# Patient Record
Sex: Female | Born: 1985 | Race: White | Hispanic: No | Marital: Single | Smoking: Current every day smoker
Health system: Southern US, Community
[De-identification: ages and names within clinical notes are randomized; demographics above are authoritative.]

## PROBLEM LIST (undated history)

## (undated) DIAGNOSIS — F1910 Other psychoactive substance abuse, uncomplicated: Secondary | ICD-10-CM

## (undated) DIAGNOSIS — I10 Essential (primary) hypertension: Secondary | ICD-10-CM

## (undated) DIAGNOSIS — K219 Gastro-esophageal reflux disease without esophagitis: Secondary | ICD-10-CM

## (undated) DIAGNOSIS — E1110 Type 2 diabetes mellitus with ketoacidosis without coma: Secondary | ICD-10-CM

## (undated) DIAGNOSIS — I509 Heart failure, unspecified: Secondary | ICD-10-CM

## (undated) HISTORY — PX: TUBAL LIGATION: SHX77

## (undated) HISTORY — DX: Heart failure, unspecified: I50.9

## (undated) HISTORY — DX: Essential (primary) hypertension: I10

---
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
## 2003-05-12 ENCOUNTER — Encounter: Payer: Self-pay | Admitting: *Deleted

## 2003-05-12 ENCOUNTER — Emergency Department (HOSPITAL_COMMUNITY): Admission: EM | Admit: 2003-05-12 | Discharge: 2003-05-12 | Payer: Self-pay

## 2003-05-20 ENCOUNTER — Inpatient Hospital Stay (HOSPITAL_COMMUNITY): Admission: AD | Admit: 2003-05-20 | Discharge: 2003-05-20 | Payer: Self-pay | Admitting: *Deleted

## 2003-06-18 ENCOUNTER — Emergency Department (HOSPITAL_COMMUNITY): Admission: EM | Admit: 2003-06-18 | Discharge: 2003-06-18 | Payer: Self-pay | Admitting: Emergency Medicine

## 2003-07-22 ENCOUNTER — Emergency Department (HOSPITAL_COMMUNITY): Admission: EM | Admit: 2003-07-22 | Discharge: 2003-07-22 | Payer: Self-pay | Admitting: Emergency Medicine

## 2003-07-22 ENCOUNTER — Encounter: Payer: Self-pay | Admitting: Emergency Medicine

## 2006-10-16 ENCOUNTER — Emergency Department (HOSPITAL_COMMUNITY): Admission: EM | Admit: 2006-10-16 | Discharge: 2006-10-16 | Payer: Self-pay | Admitting: Emergency Medicine

## 2006-12-20 ENCOUNTER — Emergency Department (HOSPITAL_COMMUNITY): Admission: EM | Admit: 2006-12-20 | Discharge: 2006-12-21 | Payer: Self-pay | Admitting: Emergency Medicine

## 2007-05-02 ENCOUNTER — Emergency Department (HOSPITAL_COMMUNITY): Admission: EM | Admit: 2007-05-02 | Discharge: 2007-05-02 | Payer: Self-pay | Admitting: Emergency Medicine

## 2007-09-01 ENCOUNTER — Emergency Department (HOSPITAL_COMMUNITY): Admission: EM | Admit: 2007-09-01 | Discharge: 2007-09-01 | Payer: Self-pay | Admitting: Emergency Medicine

## 2007-11-22 ENCOUNTER — Emergency Department (HOSPITAL_COMMUNITY): Admission: EM | Admit: 2007-11-22 | Discharge: 2007-11-22 | Payer: Self-pay | Admitting: Family Medicine

## 2007-12-31 ENCOUNTER — Inpatient Hospital Stay (HOSPITAL_COMMUNITY): Admission: EM | Admit: 2007-12-31 | Discharge: 2008-01-03 | Payer: Self-pay | Admitting: Emergency Medicine

## 2007-12-31 ENCOUNTER — Encounter: Payer: Self-pay | Admitting: Obstetrics & Gynecology

## 2008-04-29 ENCOUNTER — Inpatient Hospital Stay (HOSPITAL_COMMUNITY): Admission: EM | Admit: 2008-04-29 | Discharge: 2008-05-01 | Payer: Self-pay | Admitting: Emergency Medicine

## 2008-05-09 ENCOUNTER — Ambulatory Visit: Payer: Self-pay | Admitting: Internal Medicine

## 2008-05-10 ENCOUNTER — Encounter (INDEPENDENT_AMBULATORY_CARE_PROVIDER_SITE_OTHER): Payer: Self-pay | Admitting: Internal Medicine

## 2008-05-10 LAB — CONVERTED CEMR LAB: Microalb, Ur: 1.84 mg/dL (ref 0.00–1.89)

## 2008-06-12 ENCOUNTER — Ambulatory Visit: Payer: Self-pay | Admitting: *Deleted

## 2008-08-05 ENCOUNTER — Emergency Department (HOSPITAL_COMMUNITY): Admission: EM | Admit: 2008-08-05 | Discharge: 2008-08-05 | Payer: Self-pay | Admitting: Emergency Medicine

## 2008-10-17 DIAGNOSIS — K219 Gastro-esophageal reflux disease without esophagitis: Secondary | ICD-10-CM

## 2008-10-17 HISTORY — DX: Gastro-esophageal reflux disease without esophagitis: K21.9

## 2009-01-18 ENCOUNTER — Emergency Department (HOSPITAL_COMMUNITY): Admission: EM | Admit: 2009-01-18 | Discharge: 2009-01-19 | Payer: Self-pay | Admitting: Emergency Medicine

## 2009-02-02 ENCOUNTER — Inpatient Hospital Stay (HOSPITAL_COMMUNITY): Admission: EM | Admit: 2009-02-02 | Discharge: 2009-02-05 | Payer: Self-pay | Admitting: Emergency Medicine

## 2009-04-29 ENCOUNTER — Emergency Department (HOSPITAL_COMMUNITY): Admission: EM | Admit: 2009-04-29 | Discharge: 2009-04-29 | Payer: Self-pay | Admitting: Emergency Medicine

## 2010-05-24 ENCOUNTER — Emergency Department (HOSPITAL_COMMUNITY)
Admission: EM | Admit: 2010-05-24 | Discharge: 2010-05-25 | Payer: Self-pay | Source: Home / Self Care | Admitting: Emergency Medicine

## 2010-10-10 ENCOUNTER — Emergency Department (HOSPITAL_COMMUNITY)
Admission: EM | Admit: 2010-10-10 | Discharge: 2010-10-10 | Payer: Self-pay | Source: Home / Self Care | Admitting: Emergency Medicine

## 2010-10-17 ENCOUNTER — Inpatient Hospital Stay (HOSPITAL_COMMUNITY)
Admission: EM | Admit: 2010-10-17 | Discharge: 2010-10-19 | Payer: PRIVATE HEALTH INSURANCE | Source: Home / Self Care | Attending: Internal Medicine | Admitting: Internal Medicine

## 2010-12-14 ENCOUNTER — Emergency Department (HOSPITAL_COMMUNITY): Payer: Self-pay

## 2010-12-14 ENCOUNTER — Inpatient Hospital Stay (HOSPITAL_COMMUNITY)
Admission: EM | Admit: 2010-12-14 | Discharge: 2010-12-15 | DRG: 638 | Discharge status: Against Medical Advice | Payer: Self-pay | Attending: Internal Medicine | Admitting: Internal Medicine

## 2010-12-14 DIAGNOSIS — Z880 Allergy status to penicillin: Secondary | ICD-10-CM

## 2010-12-14 DIAGNOSIS — E860 Dehydration: Secondary | ICD-10-CM | POA: Diagnosis present

## 2010-12-14 DIAGNOSIS — E872 Acidosis, unspecified: Secondary | ICD-10-CM | POA: Diagnosis present

## 2010-12-14 DIAGNOSIS — N179 Acute kidney failure, unspecified: Secondary | ICD-10-CM | POA: Diagnosis present

## 2010-12-14 DIAGNOSIS — F17200 Nicotine dependence, unspecified, uncomplicated: Secondary | ICD-10-CM | POA: Diagnosis present

## 2010-12-14 DIAGNOSIS — E1010 Type 1 diabetes mellitus with ketoacidosis without coma: Principal | ICD-10-CM | POA: Diagnosis present

## 2010-12-14 DIAGNOSIS — Z794 Long term (current) use of insulin: Secondary | ICD-10-CM

## 2010-12-14 LAB — BASIC METABOLIC PANEL
BUN: 19 mg/dL (ref 6–23)
BUN: 13 mg/dL (ref 6–23)
CO2: 10 mEq/L — ABNORMAL LOW (ref 19–32)
Calcium: 9.3 mg/dL (ref 8.4–10.5)
Calcium: 8.8 mg/dL (ref 8.4–10.5)
Chloride: 96 mEq/L (ref 96–112)
Creatinine, Ser: 1.64 mg/dL — ABNORMAL HIGH (ref 0.4–1.2)
Creatinine, Ser: 0.75 mg/dL (ref 0.4–1.2)
GFR calc Af Amer: 46 mL/min — ABNORMAL LOW (ref >60)
GFR calc non Af Amer: 38 mL/min — ABNORMAL LOW (ref >60)
GFR calc non Af Amer: >60 mL/min (ref >60)
Glucose, Bld: 638 mg/dL (ref 70–99)
Glucose, Bld: 100 mg/dL — ABNORMAL HIGH (ref 70–99)
Potassium: 5.3 mEq/L — ABNORMAL HIGH (ref 3.5–5.1)
Potassium: 4.2 mEq/L (ref 3.5–5.1)
Sodium: 132 mEq/L — ABNORMAL LOW (ref 135–145)

## 2010-12-14 LAB — URINALYSIS, ROUTINE W REFLEX MICROSCOPIC
Bilirubin Urine: NEGATIVE
Ketones, ur: >80 mg/dL — AB
Leukocytes, UA: NEGATIVE
Nitrite: NEGATIVE
Protein, ur: 30 mg/dL — AB
Specific Gravity, Urine: 1.033 — ABNORMAL HIGH (ref 1.005–1.030)
Urine Glucose, Fasting: >1000 mg/dL — AB
Urobilinogen, UA: 0.2 mg/dL (ref 0.0–1.0)
pH: 5 (ref 5.0–8.0)

## 2010-12-14 LAB — GLUCOSE, CAPILLARY
Glucose-Capillary: >600 mg/dL (ref 70–99)
Glucose-Capillary: >600 mg/dL (ref 70–99)
Glucose-Capillary: 387 mg/dL — ABNORMAL HIGH (ref 70–99)
Glucose-Capillary: 212 mg/dL — ABNORMAL HIGH (ref 70–99)
Glucose-Capillary: 198 mg/dL — ABNORMAL HIGH (ref 70–99)
Glucose-Capillary: 199 mg/dL — ABNORMAL HIGH (ref 70–99)
Glucose-Capillary: 166 mg/dL — ABNORMAL HIGH (ref 70–99)
Glucose-Capillary: 119 mg/dL — ABNORMAL HIGH (ref 70–99)
Glucose-Capillary: 104 mg/dL — ABNORMAL HIGH (ref 70–99)

## 2010-12-14 LAB — POCT I-STAT 3, VENOUS BLOOD GAS (G3P V)
Acid-base deficit: 21 mmol/L — ABNORMAL HIGH (ref 0.0–2.0)
Bicarbonate: 6.2 mEq/L — ABNORMAL LOW (ref 20.0–24.0)
O2 Saturation: 89 %
Patient temperature: 97.9
TCO2: 7 mmol/L (ref 0–100)
pCO2, Ven: 19.4 mmHg — ABNORMAL LOW (ref 45.0–50.0)
pH, Ven: 7.107 — CL (ref 7.250–7.300)
pO2, Ven: 72 mmHg — ABNORMAL HIGH (ref 30.0–45.0)

## 2010-12-14 LAB — URINE MICROSCOPIC-ADD ON

## 2010-12-14 LAB — DIFFERENTIAL
Basophils Absolute: 0 10*3/uL (ref 0.0–0.1)
Basophils Relative: 0 % (ref 0–1)
Eosinophils Absolute: 0 10*3/uL (ref 0.0–0.7)
Eosinophils Relative: 0 % (ref 0–5)
Lymphocytes Relative: 7 % — ABNORMAL LOW (ref 12–46)
Lymphs Abs: 1.1 10*3/uL (ref 0.7–4.0)
Monocytes Absolute: 0.6 10*3/uL (ref 0.1–1.0)
Monocytes Relative: 4 % (ref 3–12)
Neutro Abs: 14.4 10*3/uL — ABNORMAL HIGH (ref 1.7–7.7)
Neutrophils Relative %: 89 % — ABNORMAL HIGH (ref 43–77)
Smear Review: ADEQUATE

## 2010-12-14 LAB — CBC
HCT: 46 % (ref 36.0–46.0)
Hemoglobin: 15.2 g/dL — ABNORMAL HIGH (ref 12.0–15.0)
MCH: 29.6 pg (ref 26.0–34.0)
MCHC: 33 g/dL (ref 30.0–36.0)
MCV: 89.7 fL (ref 78.0–100.0)
Platelets: 331 10*3/uL (ref 150–400)
RBC: 5.13 MIL/uL — ABNORMAL HIGH (ref 3.87–5.11)
RDW: 15.6 % — ABNORMAL HIGH (ref 11.5–15.5)
WBC: 16.1 10*3/uL — ABNORMAL HIGH (ref 4.0–10.5)

## 2010-12-14 LAB — PREGNANCY, URINE: Preg Test, Ur: NEGATIVE

## 2010-12-15 LAB — BASIC METABOLIC PANEL
BUN: 12 mg/dL (ref 6–23)
BUN: 9 mg/dL (ref 6–23)
BUN: 9 mg/dL (ref 6–23)
Calcium: 8.2 mg/dL — ABNORMAL LOW (ref 8.4–10.5)
Calcium: 8.4 mg/dL (ref 8.4–10.5)
Creatinine, Ser: 0.7 mg/dL (ref 0.4–1.2)
Creatinine, Ser: 0.57 mg/dL (ref 0.4–1.2)
GFR calc Af Amer: >60 mL/min (ref >60)
GFR calc non Af Amer: >60 mL/min (ref >60)
GFR calc non Af Amer: >60 mL/min (ref >60)
Glucose, Bld: 111 mg/dL — ABNORMAL HIGH (ref 70–99)
Glucose, Bld: 244 mg/dL — ABNORMAL HIGH (ref 70–99)
Potassium: 4.1 mEq/L (ref 3.5–5.1)
Sodium: 132 mEq/L — ABNORMAL LOW (ref 135–145)

## 2010-12-15 LAB — GLUCOSE, CAPILLARY
Glucose-Capillary: 102 mg/dL — ABNORMAL HIGH (ref 70–99)
Glucose-Capillary: 312 mg/dL — ABNORMAL HIGH (ref 70–99)
Glucose-Capillary: 110 mg/dL — ABNORMAL HIGH (ref 70–99)
Glucose-Capillary: 189 mg/dL — ABNORMAL HIGH (ref 70–99)

## 2010-12-15 LAB — HEMOGLOBIN A1C
Hgb A1c MFr Bld: 12.6 % — ABNORMAL HIGH (ref <5.7)
Mean Plasma Glucose: 315 mg/dL — ABNORMAL HIGH (ref <117)

## 2010-12-27 LAB — URINALYSIS, ROUTINE W REFLEX MICROSCOPIC
Bilirubin Urine: NEGATIVE
Hgb urine dipstick: NEGATIVE
Nitrite: NEGATIVE
Protein, ur: NEGATIVE mg/dL
Specific Gravity, Urine: 1.029 (ref 1.005–1.030)
Urobilinogen, UA: 0.2 mg/dL (ref 0.0–1.0)

## 2010-12-27 LAB — GLUCOSE, CAPILLARY
Glucose-Capillary: 123 mg/dL — ABNORMAL HIGH (ref 70–99)
Glucose-Capillary: 130 mg/dL — ABNORMAL HIGH (ref 70–99)
Glucose-Capillary: 137 mg/dL — ABNORMAL HIGH (ref 70–99)
Glucose-Capillary: 138 mg/dL — ABNORMAL HIGH (ref 70–99)
Glucose-Capillary: 148 mg/dL — ABNORMAL HIGH (ref 70–99)
Glucose-Capillary: 183 mg/dL — ABNORMAL HIGH (ref 70–99)
Glucose-Capillary: 188 mg/dL — ABNORMAL HIGH (ref 70–99)
Glucose-Capillary: 194 mg/dL — ABNORMAL HIGH (ref 70–99)
Glucose-Capillary: 207 mg/dL — ABNORMAL HIGH (ref 70–99)
Glucose-Capillary: 214 mg/dL — ABNORMAL HIGH (ref 70–99)
Glucose-Capillary: 233 mg/dL — ABNORMAL HIGH (ref 70–99)
Glucose-Capillary: 267 mg/dL — ABNORMAL HIGH (ref 70–99)
Glucose-Capillary: 339 mg/dL — ABNORMAL HIGH (ref 70–99)
Glucose-Capillary: 397 mg/dL — ABNORMAL HIGH (ref 70–99)
Glucose-Capillary: 460 mg/dL — ABNORMAL HIGH (ref 70–99)
Glucose-Capillary: 83 mg/dL (ref 70–99)
Glucose-Capillary: 95 mg/dL (ref 70–99)
Glucose-Capillary: >600 mg/dL (ref 70–99)

## 2010-12-27 LAB — CBC
HCT: 34.7 % — ABNORMAL LOW (ref 36.0–46.0)
HCT: 46.6 % — ABNORMAL HIGH (ref 36.0–46.0)
Hemoglobin: 16 g/dL — ABNORMAL HIGH (ref 12.0–15.0)
MCH: 29.6 pg (ref 26.0–34.0)
MCHC: 34 g/dL (ref 30.0–36.0)
MCHC: 34.3 g/dL (ref 30.0–36.0)
MCV: 86.1 fL (ref 78.0–100.0)
Platelets: 256 10*3/uL (ref 150–400)
RBC: 5.41 MIL/uL — ABNORMAL HIGH (ref 3.87–5.11)
RDW: 15.1 % (ref 11.5–15.5)
WBC: 14.8 10*3/uL — ABNORMAL HIGH (ref 4.0–10.5)

## 2010-12-27 LAB — BASIC METABOLIC PANEL
BUN: 16 mg/dL (ref 6–23)
BUN: 21 mg/dL (ref 6–23)
BUN: 26 mg/dL — ABNORMAL HIGH (ref 6–23)
BUN: 31 mg/dL — ABNORMAL HIGH (ref 6–23)
BUN: 43 mg/dL — ABNORMAL HIGH (ref 6–23)
CO2: 18 mEq/L — ABNORMAL LOW (ref 19–32)
CO2: 26 mEq/L (ref 19–32)
CO2: 27 mEq/L (ref 19–32)
Calcium: 8.5 mg/dL (ref 8.4–10.5)
Calcium: 8.8 mg/dL (ref 8.4–10.5)
Calcium: 9.3 mg/dL (ref 8.4–10.5)
Chloride: 103 mEq/L (ref 96–112)
Chloride: 104 mEq/L (ref 96–112)
Chloride: 89 mEq/L — ABNORMAL LOW (ref 96–112)
Chloride: 99 mEq/L (ref 96–112)
Creatinine, Ser: 0.39 mg/dL — ABNORMAL LOW (ref 0.4–1.2)
Creatinine, Ser: 0.45 mg/dL (ref 0.4–1.2)
Creatinine, Ser: 0.53 mg/dL (ref 0.4–1.2)
Creatinine, Ser: 0.9 mg/dL (ref 0.4–1.2)
GFR calc Af Amer: 50 mL/min — ABNORMAL LOW (ref >60)
GFR calc Af Amer: >60 mL/min (ref >60)
GFR calc Af Amer: >60 mL/min (ref >60)
GFR calc Af Amer: >60 mL/min (ref >60)
GFR calc non Af Amer: 53 mL/min — ABNORMAL LOW (ref >60)
GFR calc non Af Amer: >60 mL/min (ref >60)
GFR calc non Af Amer: >60 mL/min (ref >60)
GFR calc non Af Amer: >60 mL/min (ref >60)
GFR calc non Af Amer: >60 mL/min (ref >60)
Glucose, Bld: 176 mg/dL — ABNORMAL HIGH (ref 70–99)
Glucose, Bld: 334 mg/dL — ABNORMAL HIGH (ref 70–99)
Glucose, Bld: 607 mg/dL (ref 70–99)
Glucose, Bld: 93 mg/dL (ref 70–99)
Potassium: 3.5 mEq/L (ref 3.5–5.1)
Potassium: 3.7 mEq/L (ref 3.5–5.1)
Potassium: 3.7 mEq/L (ref 3.5–5.1)
Potassium: 3.7 mEq/L (ref 3.5–5.1)
Potassium: 4.4 mEq/L (ref 3.5–5.1)
Potassium: 5.6 mEq/L — ABNORMAL HIGH (ref 3.5–5.1)
Sodium: 132 mEq/L — ABNORMAL LOW (ref 135–145)
Sodium: 135 mEq/L (ref 135–145)
Sodium: 136 mEq/L (ref 135–145)
Sodium: 136 mEq/L (ref 135–145)

## 2010-12-27 LAB — MRSA PCR SCREENING: MRSA by PCR: NEGATIVE

## 2010-12-27 LAB — PREGNANCY, URINE: Preg Test, Ur: NEGATIVE

## 2010-12-29 NOTE — H&P (Signed)
[name], [name]           ACCOUNT NO.:  [id number]  MEDICAL RECORD NO.:  [id number]           PATIENT TYPE:  E  LOCATION:  [facility]                         FACILITY:  [facility]  PHYSICIAN:  Thad Ranger, MD       DATE OF BIRTH:  03-14-1986  DATE OF ADMISSION:  12/14/2010 DATE OF DISCHARGE:                             HISTORY & PHYSICAL   PRIMARY CARE PHYSICIAN:  Family practice, Dr. Manson [place].  CHIEF COMPLAINT:  Intractable nausea and vomiting for the last 3 days.  HISTORY OF PRESENT ILLNESS:  [name] is a 25 year old female with history of diabetes mellitus type 1 insulin-dependent, presented to the [facility] with intractable nausea and vomiting for the last 3 days.  History was obtained from the patient who stated that she started having nausea, vomiting, and multiple episodes for the last 3 days and had intractable nausea and vomiting whole night and today prompting her to come to the emergency room.  The patient has not been able to hold anything down in the last 3 days.  She has not had any insulin in the last 3 days as well.  The patient states that she has some abdominal spasmodic pain due to retching with some heartburn and gastritis.  She had some subjective chills at home.  Otherwise, no fevers or any productive cough, dysuria, or hematuria.  The patient states that she is almost running out of the insulin.  She had previous admission with DKA last month.  PAST MEDICAL HISTORY:  Diabetes type 1, on insulin.  PAST SURGICAL HISTORY:  History of tubal ligation.  SOCIAL HISTORY:  The patient smokes cigarettes half a pack per day for the last 8 years.  Denies any drug use.  Denies any alcohol drinking. Lives at home.  ALLERGIES:  CODEINE, PENICILLIN, HYDROCODONE/APAP, and TRAMADOL.  HOME MEDICATIONS: 1. NovoLog aspart sliding scale with Lantus 30 units daily at bedtime. 2. Metformin 500 mg p.o. daily.  PHYSICAL EXAMINATION:  VITAL SIGNS:   Blood pressure 131/82, pulse rate 103, at the time of triage was 132, respiratory rate 16, and temperature 97.5. GENERAL:  The patient is alert, awake, and oriented x3, not in acute distress. HEENT:  Anicteric sclerae and conjunctivae.  Pupils reactive to light and accommodation.  EOMI.  Dry mucosal membranes. NECK:  Supple.  No lymphadenopathy.  No JVD. CARDIOVASCULAR SYSTEM:  S1 and S2 clear.  Regular rate and rhythm. Tachycardia. CHEST:  Clear to auscultation bilaterally. ABDOMEN:  Soft, nontender, and nondistended.  Normal bowel sounds. EXTREMITIES:  No cyanosis, clubbing, or edema noted in upper or lower extremities. NEUROLOGIC:  No focal neurological deficits noted.  LABORATORY/DIAGNOSTIC DATA:  ABG showed pH of 7.1 with pCO2 of 19, bicarb 6.2, and O2 sats 89%.  BMET, sodium 132, potassium 5.3, glucose 638, BUN 19, creatinine 1.6, bicarb 10, and calcium 9.3.  CBC, white count of 16.1, hemoglobin 15.2, hematocrit 46.0, and platelets 331.  RADIOLOGICAL DATA:  Chest x-ray December 14, 2010, no evidence of acute cardiopulmonary disease.  IMPRESSION AND PLAN: 1. Ms. [name] is a 25 year old female presenting with high anion     gap and  metabolic acidosis secondary to diabetic ketoacidosis with     anion gap of 26.  The patient also has ketonuria on the urinalysis     with acute metabolic acidosis with pH of 7.10.  The patient will be     admitted to step-down unit with DKA protocol.  She has been started     on insulin drip in the emergency room.  We will also hydrate with     IV fluids per the DKA protocol.  Her potassium currently is     elevated at 5.3, likely to decrease with insulin drip.  Continue to     monitor for now.  We will transition her to subcu insulin when the     gap is closed and indicated resolved. 2. Acute kidney injury secondary to DKA, dehydration, and volume     depletion.  Continue to rehydrate with IV fluids. 3. Nicotine abuse.  The patient has been  started on nicotine patch.     The patient will be started on nicotine patch and the patient was     counseled against smoking for more than 10 minutes. 4. Prophylaxis.  Placed on PPI and bilateral SCDs for GI and DVT     prophylaxis respectively.     Thad Ranger, MD     RR/MEDQ  D:  12/14/2010  T:  12/14/2010  Job:  [id number]  Electronically Signed by RIPUDEEP RAI  on 12/29/2010 07:45:12 AM

## 2010-12-29 NOTE — Discharge Summary (Signed)
  [name], [name]           ACCOUNT NO.:  [id number]  MEDICAL RECORD NO.:  [id number]           PATIENT TYPE:  I  LOCATION:  2604                         FACILITY:  [facility]  PHYSICIAN:  Thad Ranger, MD       DATE OF BIRTH:  11/20/1985  DATE OF ADMISSION:  12/14/2010 DATE OF DISCHARGE:  12/15/2010                              DISCHARGE SUMMARY   Please note the patient left against medical advice on December 15, 2010.  DISCHARGE DIAGNOSIS: 1. Diabetic ketoacidosis with high anion gap and metabolic acidosis. 2. Acute kidney injury secondary to diabetic ketoacidosis and     dehydration. 3. Nicotine abuse.  BRIEF HOSPITALIZATION COURSE:  This is a 25 year old female with history of type 1 diabetes mellitus insulin dependent presented to the [facility] Emergency Room with intractable nausea, vomiting for the last few days prior to admission.  In the emergency room, the patient was found to have diabetic ketoacidosis with anion gap of 26, ketonuria with acute metabolic acidosis with pH of 7.1.  The patient was admitted to the step- down unit with DKA protocol.  On IV insulin drip and hydration with IV fluids.  Overnight during the hospitalization, the patient's anion gap closed and the patient was transitioned to subcu Lantus and sliding scale insulin.  She also received diabetic education during the hospitalization.  Around 10 o'clock in the morning, the patient turned out against medical advice and left the hospital premises.  Discharge prescriptions none given as the patient signed out AMA and left immediately.     Thad Ranger, MD     RR/MEDQ  D:  12/15/2010  T:  12/15/2010  Job:  [id number]  cc:   [facility]  Electronically Signed by RIPUDEEP RAI  on 12/29/2010 07:45:09 AM

## 2010-12-31 LAB — DIFFERENTIAL
Eosinophils Absolute: 0 10*3/uL (ref 0.0–0.7)
Eosinophils Relative: 0 % (ref 0–5)
Lymphs Abs: 3.2 10*3/uL (ref 0.7–4.0)
Monocytes Absolute: 0.7 10*3/uL (ref 0.1–1.0)
Monocytes Relative: 6 % (ref 3–12)

## 2010-12-31 LAB — URINALYSIS, ROUTINE W REFLEX MICROSCOPIC
Bilirubin Urine: NEGATIVE
Glucose, UA: >1000 mg/dL — AB
Hgb urine dipstick: NEGATIVE
Ketones, ur: 15 mg/dL — AB
Leukocytes, UA: NEGATIVE
pH: 6 (ref 5.0–8.0)

## 2010-12-31 LAB — BASIC METABOLIC PANEL
CO2: 22 mEq/L (ref 19–32)
Chloride: 96 mEq/L (ref 96–112)
GFR calc Af Amer: >60 mL/min (ref >60)
Glucose, Bld: 668 mg/dL (ref 70–99)
Sodium: 128 mEq/L — ABNORMAL LOW (ref 135–145)

## 2010-12-31 LAB — GLUCOSE, CAPILLARY
Glucose-Capillary: 259 mg/dL — ABNORMAL HIGH (ref 70–99)
Glucose-Capillary: 388 mg/dL — ABNORMAL HIGH (ref 70–99)

## 2010-12-31 LAB — CBC
HCT: 38.3 % (ref 36.0–46.0)
Hemoglobin: 13.1 g/dL (ref 12.0–15.0)
MCH: 30.1 pg (ref 26.0–34.0)
MCV: 88.1 fL (ref 78.0–100.0)
RBC: 4.35 MIL/uL (ref 3.87–5.11)

## 2010-12-31 LAB — URINE MICROSCOPIC-ADD ON

## 2011-01-10 ENCOUNTER — Emergency Department (HOSPITAL_COMMUNITY)
Admission: EM | Admit: 2011-01-10 | Discharge: 2011-01-11 | Disposition: A | Discharge status: Home / Self Care | Payer: Self-pay | Attending: Emergency Medicine | Admitting: Emergency Medicine

## 2011-01-10 DIAGNOSIS — H5789 Other specified disorders of eye and adnexa: Secondary | ICD-10-CM | POA: Insufficient documentation

## 2011-01-10 DIAGNOSIS — H109 Unspecified conjunctivitis: Secondary | ICD-10-CM | POA: Insufficient documentation

## 2011-01-10 DIAGNOSIS — E118 Type 2 diabetes mellitus with unspecified complications: Secondary | ICD-10-CM | POA: Insufficient documentation

## 2011-01-10 DIAGNOSIS — Z794 Long term (current) use of insulin: Secondary | ICD-10-CM | POA: Insufficient documentation

## 2011-01-10 DIAGNOSIS — R51 Headache: Secondary | ICD-10-CM | POA: Insufficient documentation

## 2011-01-10 LAB — CBC
HCT: 38.9 % (ref 36.0–46.0)
MCHC: 33.4 g/dL (ref 30.0–36.0)
MCV: 87.6 fL (ref 78.0–100.0)
RDW: 15.4 % (ref 11.5–15.5)

## 2011-01-10 LAB — DIFFERENTIAL
Basophils Absolute: 0 10*3/uL (ref 0.0–0.1)
Eosinophils Relative: 2 % (ref 0–5)
Lymphocytes Relative: 38 % (ref 12–46)
Monocytes Absolute: 0.6 10*3/uL (ref 0.1–1.0)

## 2011-01-10 LAB — BASIC METABOLIC PANEL
BUN: 10 mg/dL (ref 6–23)
Calcium: 8.9 mg/dL (ref 8.4–10.5)
GFR calc non Af Amer: >60 mL/min (ref >60)
Glucose, Bld: 470 mg/dL — ABNORMAL HIGH (ref 70–99)
Sodium: 130 mEq/L — ABNORMAL LOW (ref 135–145)

## 2011-01-23 LAB — URINALYSIS, ROUTINE W REFLEX MICROSCOPIC
Bilirubin Urine: NEGATIVE
Hgb urine dipstick: NEGATIVE
Ketones, ur: NEGATIVE mg/dL
Protein, ur: NEGATIVE mg/dL
Urobilinogen, UA: 0.2 mg/dL (ref 0.0–1.0)

## 2011-01-26 LAB — POCT I-STAT, CHEM 8
Calcium, Ion: 1.18 mmol/L (ref 1.12–1.32)
Creatinine, Ser: 0.3 mg/dL — ABNORMAL LOW (ref 0.4–1.2)
Glucose, Bld: 482 mg/dL — ABNORMAL HIGH (ref 70–99)
HCT: 48 % — ABNORMAL HIGH (ref 36.0–46.0)
Hemoglobin: 16.3 g/dL — ABNORMAL HIGH (ref 12.0–15.0)

## 2011-01-26 LAB — POCT I-STAT 3, ART BLOOD GAS (G3+)
Acid-base deficit: 13 mmol/L — ABNORMAL HIGH (ref 0.0–2.0)
O2 Saturation: 97 %
Patient temperature: 98.6

## 2011-01-26 LAB — COMPREHENSIVE METABOLIC PANEL
ALT: 26 U/L (ref 0–35)
AST: 22 U/L (ref 0–37)
AST: 28 U/L (ref 0–37)
Albumin: 2.5 g/dL — ABNORMAL LOW (ref 3.5–5.2)
Alkaline Phosphatase: 128 U/L — ABNORMAL HIGH (ref 39–117)
Alkaline Phosphatase: 66 U/L (ref 39–117)
CO2: 11 mEq/L — ABNORMAL LOW (ref 19–32)
CO2: 19 mEq/L (ref 19–32)
Chloride: 106 mEq/L (ref 96–112)
Creatinine, Ser: 0.39 mg/dL — ABNORMAL LOW (ref 0.4–1.2)
GFR calc Af Amer: >60 mL/min (ref >60)
GFR calc non Af Amer: >60 mL/min (ref >60)
Glucose, Bld: 473 mg/dL — ABNORMAL HIGH (ref 70–99)
Potassium: 3.5 mEq/L (ref 3.5–5.1)
Potassium: 4.6 mEq/L (ref 3.5–5.1)
Sodium: 131 mEq/L — ABNORMAL LOW (ref 135–145)
Total Bilirubin: 0.7 mg/dL (ref 0.3–1.2)
Total Protein: 7.2 g/dL (ref 6.0–8.3)

## 2011-01-26 LAB — URINE MICROSCOPIC-ADD ON

## 2011-01-26 LAB — POCT I-STAT 3, VENOUS BLOOD GAS (G3P V)
Acid-base deficit: 9 mmol/L — ABNORMAL HIGH (ref 0.0–2.0)
Bicarbonate: 14.8 mEq/L — ABNORMAL LOW (ref 20.0–24.0)
O2 Saturation: 91 %
Patient temperature: 98
TCO2: 16 mmol/L (ref 0–100)
pO2, Ven: 62 mmHg — ABNORMAL HIGH (ref 30.0–45.0)

## 2011-01-26 LAB — URINALYSIS, ROUTINE W REFLEX MICROSCOPIC
Glucose, UA: >1000 mg/dL — AB
Leukocytes, UA: NEGATIVE
Nitrite: NEGATIVE
Protein, ur: NEGATIVE mg/dL
Urobilinogen, UA: 0.2 mg/dL (ref 0.0–1.0)

## 2011-01-26 LAB — BASIC METABOLIC PANEL
BUN: 6 mg/dL (ref 6–23)
BUN: 7 mg/dL (ref 6–23)
CO2: 28 mEq/L (ref 19–32)
Calcium: 8 mg/dL — ABNORMAL LOW (ref 8.4–10.5)
Chloride: 106 mEq/L (ref 96–112)
Chloride: 97 mEq/L (ref 96–112)
Creatinine, Ser: 0.4 mg/dL (ref 0.4–1.2)
GFR calc Af Amer: >60 mL/min (ref >60)
GFR calc non Af Amer: >60 mL/min (ref >60)
Glucose, Bld: 222 mg/dL — ABNORMAL HIGH (ref 70–99)
Potassium: 3.5 mEq/L (ref 3.5–5.1)
Sodium: 137 mEq/L (ref 135–145)
Sodium: 135 mEq/L (ref 135–145)

## 2011-01-26 LAB — GLUCOSE, CAPILLARY
Glucose-Capillary: 124 mg/dL — ABNORMAL HIGH (ref 70–99)
Glucose-Capillary: 163 mg/dL — ABNORMAL HIGH (ref 70–99)
Glucose-Capillary: 234 mg/dL — ABNORMAL HIGH (ref 70–99)
Glucose-Capillary: 325 mg/dL — ABNORMAL HIGH (ref 70–99)
Glucose-Capillary: 345 mg/dL — ABNORMAL HIGH (ref 70–99)
Glucose-Capillary: 585 mg/dL (ref 70–99)
Glucose-Capillary: 92 mg/dL (ref 70–99)
Glucose-Capillary: 240 mg/dL — ABNORMAL HIGH (ref 70–99)

## 2011-01-26 LAB — CBC
HCT: 37.7 % (ref 36.0–46.0)
Hemoglobin: 13.1 g/dL (ref 12.0–15.0)
Hemoglobin: 15.3 g/dL — ABNORMAL HIGH (ref 12.0–15.0)
MCV: 90.3 fL (ref 78.0–100.0)
Platelets: 214 10*3/uL (ref 150–400)
Platelets: 229 10*3/uL (ref 150–400)
RBC: 4.18 MIL/uL (ref 3.87–5.11)
RBC: 4.86 MIL/uL (ref 3.87–5.11)
RDW: 14 % (ref 11.5–15.5)
RDW: 14.3 % (ref 11.5–15.5)
WBC: 6.4 10*3/uL (ref 4.0–10.5)
WBC: 7.8 10*3/uL (ref 4.0–10.5)

## 2011-01-26 LAB — DIFFERENTIAL
Basophils Absolute: 0.1 10*3/uL (ref 0.0–0.1)
Basophils Relative: 0 % (ref 0–1)
Basophils Relative: 1 % (ref 0–1)
Eosinophils Absolute: 0.1 10*3/uL (ref 0.0–0.7)
Eosinophils Absolute: 0.2 10*3/uL (ref 0.0–0.7)
Eosinophils Relative: 1 % (ref 0–5)
Eosinophils Relative: 2 % (ref 0–5)
Monocytes Absolute: 0.5 10*3/uL (ref 0.1–1.0)
Monocytes Relative: 5 % (ref 3–12)
Neutrophils Relative %: 66 % (ref 43–77)

## 2011-01-26 LAB — RAPID URINE DRUG SCREEN, HOSP PERFORMED
Amphetamines: NOT DETECTED
Barbiturates: NOT DETECTED
Benzodiazepines: NOT DETECTED
Cocaine: NOT DETECTED
Opiates: NOT DETECTED
Tetrahydrocannabinol: NOT DETECTED

## 2011-01-26 LAB — KETONES, QUALITATIVE

## 2011-01-26 LAB — HEMOGLOBIN A1C: Hgb A1c MFr Bld: >19.2 % — ABNORMAL HIGH (ref 4.6–6.1)

## 2011-02-09 ENCOUNTER — Emergency Department (HOSPITAL_COMMUNITY)
Admission: EM | Admit: 2011-02-09 | Discharge: 2011-02-09 | Disposition: A | Discharge status: Home / Self Care | Payer: Self-pay | Attending: Emergency Medicine | Admitting: Emergency Medicine

## 2011-02-09 DIAGNOSIS — IMO0002 Reserved for concepts with insufficient information to code with codable children: Secondary | ICD-10-CM | POA: Insufficient documentation

## 2011-02-09 DIAGNOSIS — E109 Type 1 diabetes mellitus without complications: Secondary | ICD-10-CM | POA: Insufficient documentation

## 2011-02-09 DIAGNOSIS — Z794 Long term (current) use of insulin: Secondary | ICD-10-CM | POA: Insufficient documentation

## 2011-02-11 ENCOUNTER — Ambulatory Visit
Admission: RE | Admit: 2011-02-11 | Discharge: 2011-02-11 | Disposition: A | Discharge status: Home / Self Care | Payer: Self-pay | Source: Ambulatory Visit | Attending: Orthopedic Surgery | Admitting: Orthopedic Surgery

## 2011-02-11 ENCOUNTER — Other Ambulatory Visit: Payer: Self-pay | Admitting: Orthopedic Surgery

## 2011-02-11 DIAGNOSIS — L089 Local infection of the skin and subcutaneous tissue, unspecified: Secondary | ICD-10-CM

## 2011-02-11 DIAGNOSIS — M79645 Pain in left finger(s): Secondary | ICD-10-CM

## 2011-03-01 NOTE — H&P (Signed)
NAME:  [name], BLANKS NO.:  [id number]   MEDICAL RECORD NO.:  [id number]          PATIENT TYPE:  EMS   LOCATION:  ED                           FACILITY:  [facility]   PHYSICIAN:  Marcellus Scott, MD     DATE OF BIRTH:  [birth year]   DATE OF ADMISSION:  12/31/2007  DATE OF DISCHARGE:                              HISTORY & PHYSICAL   DATE OF ADMISSION:  December 31, 2007   PRIMARY CARE PHYSICIAN:  Unassigned   GYNECOLOGIST:  Charles A. Clearance Coots, M.D.   CHIEF COMPLAINT:  Abdominal pain, generalized weakness, thirsty,  increased urinary frequency.   HISTORY OF PRESENT ILLNESS:  [name] is a pleasant 25 year old  Caucasian female patient with no known past medical history. She  indicates that she was in her usual state of health until approximately  three weeks ago. Since three weeks, she has been having intermittent  symptoms of dizziness, lightheadedness, generalized weakness, blurred  vision, excessive thirst, dry mouth despite drinking adequate liquids,  increased frequency urination multiple times in the night and twenty  times during the day, increased appetite where she says she stuffs  herself, but has lost approximately 17 pounds of weight in the last one  month. The patient indicates that she did not seek any attention because  the symptoms would be there one day and not the next and she thought  they would go away. She also complained of some burning of her lower  extremities while asleep. In any event, since last three days, she has  noted gradual onset of lower abdominal pain which is intermittent,  stabbing in nature, 8/10 in severity, which is not radiating. This was  associated with nausea, but no vomiting. She has had normal bowel  movements. The patient denies any fevers, chills or rigors. Because of  her worsening of these symptoms, she went to the [facility]  thinking she had a genital cyst. There, the evaluation revealed her to  have  significantly elevated blood sugars and the patient was  subsequently transferred to [facility] Emergency Room.   The patient in the ED here has received 2 liters of normal saline and is  currently receiving normal saline at 250 mL an hour. She has received 12  units of regular insulin and her most recent blood sugar is 311  mg/deciliter. The patient says that she is feeling better with decrease  in her abdominal pain and regaining some strength.   PAST MEDICAL HISTORY:  None.   PAST SURGICAL HISTORY:  Bilateral tubal ligation.   PSYCHIATRIC HISTORY:  None.   ALLERGIES:  1. CODEINE.  2. PENICILLIN.  3. HYDROCODONE.   THESE MEDICATIONS SAID TO CAUSE RASH AND SWELLING OF HER THROAT.   CURRENT MEDICATIONS:  None.   FAMILY HISTORY:  The patient's maternal great grandmother with history  of diabetes and died secondary to its complications. Mother with history  of lung cancer.   SOCIAL HISTORY:  The patient is married. She has two children ages 58 and  2 years. She works at [employer] as a Conservation officer, nature. She smokes a half pack of  cigarettes per day for the last 8 years. She has no history of alcohol  or drug abuse.   REVIEW OF SYSTEMS:  Fourteen systems reviewed and apart from history of  presenting illness, is noncontributory.   PHYSICAL EXAMINATION:  [name] is a moderately well-nourished  female patient who is in no obvious distress.  VITAL SIGNS: Temperature 97.8 degrees Fahrenheit, blood pressure 124/76  mmHg, pulse 82, respirations 18, saturating at 98% on room air.  HEENT: Nontraumatic, normocephalic. Pupils equally reactive to light and  accommodation. Oral cavity with mild dry mucosa. Bilateral tonsils  enlarged, but not inflamed. No oral thrush.  NECK: Supple. No JVD or carotid bruit.  LYMPHATICS: The patient has small approximately 0.5 cm diameter mobile  nontender multiple anterior cervical lymph nodes.  BREASTS: Examination deferred.  RESPIRATORY:  System is clear to auscultation bilaterally.  CARDIOVASCULAR: 1st and 2nd heart sounds heard. Question short 2/6  systolic murmur at the apex.  ABDOMEN: Nondistended, nontender. Soft. No organomegaly or mass  appreciated. Bowel sounds are normally heard.  CENTRAL NERVOUS SYSTEM: The patient is awake, alert and oriented x3. No  focal neurological or cranial nerve deficits.  EXTREMITIES: With no cyanosis, clubbing or edema. Peripheral pulses are  symmetrically full.  SKIN: Is without any rashes.  MUSCULOSKELETAL: System is unremarkable.   LABORATORY DATA:  Hemoglobin A1c of 16.6, comprehensive metabolic panel  with sodium 128, potassium 4.1, chloride 94, bicarb 18, glucose 437, BUN  15, creatinine 0.93. Hepatic panel only remarkable for a bilirubin of 2.  Calculated anion gap of 16. Urine analysis with rare bacteria, 0-2 white  blood cells per high-powered field, greater than 1000 glucose, greater  than 80 ketones. CBC is with hemoglobin of 14.8, hematocrit 42.1, white  blood cells 10.7, platelets 235. The patient on a previous metabolic  panel had an anion gap of 19, which is definitely increased. Rapid strep  screen is negative. Wet prep with no yeast or Trichomonas, many clue  cells. Urine pregnancy test was negative.   ASSESSMENT/PLAN:  1. Uncontrolled newly diagnosed diabetes, question Type 1 diabetes.      Mild ketoacidosis. Will admit the patient to the step-down unit.      Will continue aggressive normal saline hydration. Will place the      patient on an insulin drip and check frequent basic metabolic      panels. Once her blood sugars are controlled per protocol on the      insulin drip, will switch to subcutaneous Lantus insulin. Will also      obtain diabetic instruction and education.  2. Dehydration secondary to problem #1, for IV fluid hydration.  3. Tobacco abuse. For counseling.  4. Abdominal pain. Most likely secondary to problem #1. Will check      lipase and x-ray  abdomen.      Marcellus Scott, MD  Electronically Signed     AH/MEDQ  D:  12/31/2007  T:  12/31/2007  Job:  [id number]   cc:   Leonette Most A. Clearance Coots, M.D.  Fax: [phone]

## 2011-03-01 NOTE — Discharge Summary (Signed)
[name], [name]           ACCOUNT NO.:  [id number]   MEDICAL RECORD NO.:  [id number]          PATIENT TYPE:  INP   LOCATION:  1517                         FACILITY:  [facility]   PHYSICIAN:  Isidor Holts, M.D.  DATE OF BIRTH:  08/20/1986   DATE OF ADMISSION:  12/31/2007  DATE OF DISCHARGE:  01/03/2008                               DISCHARGE SUMMARY   PRIMARY CARE PHYSICIAN:  Unassigned.   PRIMARY GYNECOLOGIST:  Dr. Brock Bad.   DISCHARGE DIAGNOSES:  1. Type 1 diabetes mellitus, newly diagnosed.  2. Diabetic ketoacidosis.  3. Dehydration, secondary to #1 above.  4. Smoking history.  5. Bacterial vaginosis.   DISCHARGE MEDICATIONS:  1. Lantus insulin 30 units subcutaneously q.h.s.  2. Sliding scale insulin coverage with NovoLog as follows:  CBG 70-100      no insulin;  CBG 101-150, 2 units; CBG 151-200, 3 units; CBG 201-      250, 5 units; CBG 251-300, 7 units; CBG 301-350, 9 units; CBG 351-      400, 11 units.   PROCEDURES.:  1. Abdominal x-ray dated December 31, 2007. This showed no evidence of      intraperitoneal free air.  There was normal bowel gas pattern.  2. Transvaginal/pelvic ultrasound scan dated December 31, 2007. This was      a normal study with no evidence of pelvic mass or free fluid.   CONSULTATIONS:  None.   ADMISSION HISTORY:  As in H&P notes of December 31, 2007 dictated by Dr.  Marcellus [name]. However in brief, this is a 25 year old female, with no  significant past medical history, smoker, status post bilateral tubal  ligation who presents with abdominal pain, generalized weakness,  increased thirst and urinary frequency of approximately 3  weeks  duration, associated with dizziness, lightheadedness, blurred vision and  approximately a 17-pound weight loss. On initial evaluation, she was  found to have a serum sodium of 128, potassium 4.1.  Chloride 94,  bicarbonate 18, glucose 437, BUN was 15, creatinine 0.93, calculated  anion gap was 16.  The  patient was admitted for further evaluation,  investigation and management.   CLINICAL COURSE:  1. Type 1 diabetes mellitus.  This is a new diagnosis.  The patient      presented with symptoms as mentioned above, and the biochemical      findings were consistent with mild diabetic ketoacidosis.  She was      managed with aggressive intravenous fluid hydration and intravenous      insulin infusion via glucostabilizer protocol, with satisfactory      clinical response. By January 01, 2008, anion gap had closed and CBG      had dropped down to 111.  The patient was therefore transitioned to      scheduled Lantus insulin, sliding scale insulin coverage, and      carbohydrate modified diet.  Over the course of next few days, we      had to titrate her Lantus dosage upwards, but as of January 03, 2008,      blood glucose was reasonably controlled at 169.  The patient  has      undergone diabetic teaching in the hospital environment, and is      recommended outpatient diabetic classes/followup.   1. Dehydration.  This was secondary to #1 above, and was adequately      addressed with intravenous fluids.   1. Smoking history.  The patient smokes approximately a half a pack of      cigarettes per day.  She was counseled appropriately and during      this hospitalization, and was managed with Nicoderm CQ patch.   1. Abdominal pain.  This was likely secondary to diabetic      ketoacidosis, as outlined above. The patient underwent      transvaginal/pelvic ultrasound scan which demonstrated no acute      abnormality.  GC and Chlamydia probe were negative.  Wet prep      examination however, demonstrated clue cells.  She was managed to      single oral dosage of 2 grams of Flagyl.   DISPOSITION:  The patient was on January 03, 2008 considered sufficiently  clinically recovered and stable to be discharged.  She was therefore  discharged accordingly.  There had been some concern of possible anemia.  The  patient did have a hemoglobin of 14.8 at the time of presentation on  December 31, 2007, dropping to 11.9 on January 01, 2008. This however was  likely secondary to rehydration.  Of note her iron studies on January 01, 2008 showed iron of 63, TIBC 154, percentage saturation 41, B12 880,  folate 9.0 i.e. within normal limits.  Her repeat CBC on January 02, 2008  showed normal hematologic indices with a hemoglobin of 12.7.  Hematocrit  36.1.   DIET:  Carbohydrate modified diet is recommended.   ACTIVITY:  As tolerated.   The patient is recommended to return to regular duties on January 08, 2008. In addition, she has been recommended to establish a primary MD.  Appropriate information has been provided, and follow-up is recommended  within 1-2 weeks of discharge.  She will also be referred to outpatient  diabetic classes/follow-up.      Isidor Holts, M.D.  Electronically Signed     CO/MEDQ  D:  01/03/2008  T:  01/03/2008  Job:  [id number]

## 2011-03-01 NOTE — H&P (Signed)
[name], [name]           ACCOUNT NO.:  [id number]   MEDICAL RECORD NO.:  [id number]          PATIENT TYPE:  INP   LOCATION:  1420                         FACILITY:  [facility]   PHYSICIAN:  Della Goo, M.D. DATE OF BIRTH:  1985-11-12   DATE OF ADMISSION:  04/29/2008  DATE OF DISCHARGE:                              HISTORY & PHYSICAL   PRIMARY CARE PHYSICIAN:  Unassigned.   CHIEF COMPLAINT:  Sick for 3 days and elevated blood sugars.   HISTORY OF PRESENT ILLNESS:  This is a 25 year old type 1 diabetic  female who presents to the emergency department with complaints of being  sick for 3 days with a sore throat.  Also having nasal congestion and  reported elevated blood sugars for the past 3 days.  She also reports  having increased thirst and increased urination along with her symptoms.  The patient does not have a primary care physician at this time.  However, she has managed to continue on her treatment of Lantus insulin  and sliding scale insulin coverage.  She was found to have a blood sugar  of 497, however her bicarb was 20.  She was referred for admission for  mild DKA.   PAST MEDICAL HISTORY:  Type 1 diabetes mellitus.   PAST SURGICAL HISTORY:  History of a bilateral tubal ligation.   MEDICATIONS:  Lantus insulin 35 units subcu nightly and sliding scale  insulin coverage with NovoLog p.r.n.   ALLERGIES:  PENICILLIN AND CODEINE.   SOCIAL HISTORY:  The patient is a smoker.  She is a nondrinker.   FAMILY HISTORY:  Noncontributory.   REVIEW OF SYSTEMS:  The patient denies having any fevers, chills, loss  of appetite, weight loss.  Her pertinent positives are mentioned above.  She denies having any chest pain or shortness of breath.   PHYSICAL EXAMINATION:  GENERAL:  This is a 25 year old well-nourished,  well-developed thin female in no acute distress.  VITAL SIGNS:  Her vital signs are stable.  She is afebrile at this time.  HEENT: Normocephalic, atraumatic.   Pupils equally round reactive to  light.  Extraocular movements are intact.  Funduscopic benign.  There is  no scleral icterus.  Oropharynx is clear.  NECK:  Neck is supple, full range of motion.  No thyromegaly,  adenopathy, jugulovenous distention.  CARDIOVASCULAR:  Regular rate and rhythm.  No murmurs, gallops or rubs.  LUNGS: Clear to auscultation bilaterally.  ABDOMEN: Positive bowel sounds, soft, nontender, nondistended.  EXTREMITIES: Without cyanosis, clubbing or edema.   ASSESSMENT:  A 25 year old female being admitted with:  1. Hyperglycemia.  2. Mild DKA.  3. Tobacco abuse.   PLAN:  The patient will be admitted to telemetry area for monitoring and  will continue on the IV insulin drip DKA protocol.  IV fluids have been  ordered and her electrolytes will be monitored.  Potassium replacement  will also be provided because her potassium level started at 4.2.  With  the insulin IV her potassium level will decrease so supplemental  potassium will be given.  The patient will be transitioned to Lantus 40  mg subcu nightly  and blood sugar checks will continue on q.1h. x4 after  the IV insulin drip has been discontinued.  The patient will also be  placed on a diabetic diet.  DVT and GI prophylaxis have been ordered as  well.      Della Goo, M.D.  Electronically Signed     HJ/MEDQ  D:  04/29/2008  T:  04/30/2008  Job:  [id number]

## 2011-03-01 NOTE — H&P (Signed)
NAMEBRANTLEIGH, [name]           ACCOUNT NO.:  [id number]   MEDICAL RECORD NO.:  [id number]          PATIENT TYPE:  INP   LOCATION:  3705                         FACILITY:  [facility]   PHYSICIAN:  Selena Batten, MD     DATE OF BIRTH:  1986-04-22   DATE OF ADMISSION:  02/02/2009  DATE OF DISCHARGE:                              HISTORY & PHYSICAL   CHIEF COMPLAINT:  Abdominal pain and decreased p.o. intake.   HISTORY OF PRESENT ILLNESS:  This is a 25 year old Caucasian female with  diabetes mellitus and multiple admissions for DKA who presents with one-  week history of abdominal pain, decreased p.o intake, polyuria, and dry  mouth.  Symptoms have been getting worse over the past week.  She was  recently discharged from [facility], but ran out of medications  prior to appointment with [facility] on April the 30th.  She denies any  overdose of the chest pain.  She does have some abdominal pain quite on  the right side.  No relationship to eating.  Otherwise the patient does  not endorse any further complaints.  Denies any febrile episodes.   PAST MEDICAL HISTORY:  Type 1 diabetes mellitus.   MEDICATIONS:  1. Lantus an unknown dose, she has run out.  2. Novolog.  She does not know the dose because she has run out.  3. She is on an unknown blood pressure pill and says that she has run      out.   FAMILY HISTORY:  As far as her family history, it is noncontributory.   SOCIAL HISTORY:  Smoker, nondrinker.   ALLERGIES:  She is allergic to penicillin and codeine.   REVIEW OF SYSTEMS:  A 14-point review of systems was performed and was  negative except as per HPI.   PHYSICAL EXAMINATION:  VITAL SIGNS:  Blood pressure is 114/74, with a  pulse of 72, in no acute distress.  HEAD:  Normocephalic, atraumatic.  EYES:  Pupils equal, round, and reactive to light.  Extraocular muscles  are intact.  NECK:  Supple.  No masses.  No bruits or thyromegaly.  LUNGS:  Clear to auscultation  bilaterally.  HEART:  Regular rate and rhythm.  No murmurs, rubs, or gallops.  ABDOMEN:  Positive bowel sounds.  Soft, nontender, nondistended.  EXTREMITIES:  No clubbing, cyanosis, or edema.   INITIAL LABORATORY EVALUATION:  As far as her lab work, the patient had  a pH of 7.28 on ABG, and she had a significant amount of ketones.  Her  bicarb on CMP was 11.  Urinalysis is negative for any infection.  Pregnancy test was negative.   ASSESSMENT:  This is a 25 year old female with diabetes mellitus who  presents here with problem of diabetic ketoacidosis.  Diabetic ketoacidosis.  To continue insulin drip overnight.  We will  attempt to discontinue and get the patient on long acting insulin  tomorrow a.m.  We will continue to hydrate the patient and monitor her  serum potassium.  We will check her hemoglobin A1c.  On discharge, we  will ensure the patient has enough medications to last until her  appointment with [facility] on April 30th.   DISPOSITION:  Pending further management.      Selena Batten, MD  Electronically Signed     BS/MEDQ  D:  02/02/2009  T:  02/03/2009  Job:  [id number]

## 2011-03-01 NOTE — Discharge Summary (Signed)
NAMEMARISHA, [name]           ACCOUNT NO.:  [id number]   MEDICAL RECORD NO.:  [id number]          PATIENT TYPE:  INP   LOCATION:  1420                         FACILITY:  [facility]   PHYSICIAN:  Hillery Aldo, M.D.   DATE OF BIRTH:  02/09/1986   DATE OF ADMISSION:  04/29/2008  DATE OF DISCHARGE:  05/01/2008                               DISCHARGE SUMMARY   PRIMARY CARE PHYSICIAN:  Unassigned.   DISCHARGE DIAGNOSES:  1. Mild diabetic ketoacidosis.  2. Type 1 diabetes.  3. Medical nonadherence.  4. Tobacco abuse.   DISCHARGE MEDICATIONS:  1. Lantus insulin 40 units subcutaneously nightly.  2. Sliding scale insulin, insulin sensitive scale, q.a.c. and bedtime.   CONSULTATIONS:  None.   BRIEF ADMISSION HPI:  The patient is a 25 year old female who presented  to the hospital with elevated blood glucoses in the setting of no  insulin for 3 days prior to presentation.  The patient simply ran out of  her insulin as not have insurance and a payer source.  She presented to  the emergency department for evaluation, and was found to be in mild  diabetic ketoacidosis; and, therefore, was admitted.  For the full  details, please see the dictated report done by Dr. Lovell Sheehan.   PROCEDURES AND DIAGNOSTIC STUDIES:  None.   DISCHARGE LABORATORY VALUES:  Hemoglobin ACE A1c was markedly elevated  at 19.2.  Sodium was 139, potassium 3.9, chloride 107, bicarb 27, BUN 3,  creatinine 0.56, glucose 239.  White blood cell count was 11.6,  hemoglobin 12.9, hematocrit 38.8, platelets 188.   HOSPITAL COURSE BY PROBLEM:  Problem #1:  Type 1 diabetes/diabetic  ketoacidosis.  The patient was admitted and put on the Glucommander  protocol and insulin drip.  Once her anion gap closed, and she was fully  rehydrated, her insulin regimen was transitioned over to subcutaneous  long-acting and NovoLog insulins.  The patient was counseled regarding  the importance of maintaining her treatment for diabetes to  prevent  readmission.  She was referred to the department of health services to  obtain insulin and insulin syringes.   DISPOSITION:  The patient was medically stable for discharge on May 01, 2008.  She was instructed to followup at [facility].      Hillery Aldo, M.D.  Electronically Signed     CR/MEDQ  D:  05/01/2008  T:  05/01/2008  Job:  [id number]

## 2011-03-01 NOTE — Discharge Summary (Signed)
[name], [name]           ACCOUNT NO.:  [id number]   MEDICAL RECORD NO.:  [id number]          PATIENT TYPE:  INP   LOCATION:  3705                         FACILITY:  [facility]   PHYSICIAN:  Herbie Saxon, MDDATE OF BIRTH:  July 24, 1986   DATE OF ADMISSION:  02/02/2009  DATE OF DISCHARGE:  02/05/2009                               DISCHARGE SUMMARY   DISCHARGE DIAGNOSES:  1. Diabetic ketoacidosis resolved.  2. Type 1 diabetes control improved.  3. Tobacco abuse.  4. Poor medical compliance.  5. Hyponatremia, resolved.  6. Borderline hypertension.  7. Hypokalemia resolved.  8. Protein-calorie malnutrition.   HOSPITAL COURSE:  This 25 year old Caucasian lady presented to the  emergency room with 1-week history of abdominal pain, anorexia,  polyuria, and thirst.  The patient recently ran out of her medication  after having being discharged from [facility] and next  appointment is with [facility] on February 13, 2009.  The patient was  admitted by the emergency room started on IV insulin drip and IV fluid  normal saline.  The patient was counseled on tobacco cessation, however.  The patient verbalized that she does not want to quit presently.  The  patient walked off the unit on February 04, 2009, was subsequently taken  out of the system per hospital protocol per nursing.  Her Lantus insulin  has been discontinued and she has been started on 70/30 insulin,  diabetes control is much improved, hyponatremia, and hypokalemia  resolved.   DISCHARGE CONDITION:  Stable.   DIET:  A 1800-calorie ADA and low cholesterol.   ACTIVITY:  No restrictions.   FOLLOWUP:  The patient is to follow up with the [facility] in the next  5-7 days for optimization of her diabetes control as an outpatient and  she may benefit from an endocrinology evaluation as an outpatient by the  primary care physician's discretion.   MEDICATIONS ON DISCHARGE:  A 70/30 insulin 60 units subcutaneous q.12  h.  Accu-Chek a.c. and at bedtime and NovoLog sliding scale moderate  hospital scale.  Clinical social worker, case management to assist the  patient with the 3-day hospital supply of insulin.   PHYSICAL EXAMINATION:  GENERAL:  On examination today, she is a young  lady, not in acute distress.  VITAL SIGNS:  Temperature is 98, pulse is 64, respiratory rate is 16,  blood pressure 96/59, and saturating 98% on room air.  HEENT:  Pupils are equal, reactive to light and accommodation.  Head is  atraumatic and normocephalic.  Extraocular muscles are intact.  NECK:  Supple with no bruit, thyromegaly, or masses.  CHEST:  Clinically clear.  HEART:  Heart sounds 1 and 2.  Regular rate and rhythm.  No murmurs,  rubs, or gallops.  ABDOMEN:  Soft, nontender, and nondistended.  Bowel sounds present.  Peripheral pulses present.  No clubbing, no cyanosis.  No calf  tenderness.  SKIN:  No rashes.  MUSCULOSKELETAL:  Joints normal range of movement.   LABORATORY DATA:  WBC is 6.4, hematocrit 38, and platelet count is 214.  Chemistry, sodium is 140, potassium 3.7, chloride 106, bicarbonate 28,  glucose  141, BUN 7, and creatinine 0.4.   RADIOLOGY:  No radiology procedure was done on this recent admission.   Discharge time greater than 30 minutes.      Herbie Saxon, MD  Electronically Signed     MIO/MEDQ  D:  02/05/2009  T:  02/06/2009  Job:  [id number]   cc:   Dr. Reche Dixon, Dala Dock

## 2011-03-03 ENCOUNTER — Emergency Department (HOSPITAL_COMMUNITY)
Admission: EM | Admit: 2011-03-03 | Discharge: 2011-03-03 | Disposition: A | Discharge status: Home / Self Care | Payer: No Typology Code available for payment source | Attending: Emergency Medicine | Admitting: Emergency Medicine

## 2011-03-03 ENCOUNTER — Emergency Department (HOSPITAL_COMMUNITY): Payer: Self-pay

## 2011-03-03 DIAGNOSIS — R109 Unspecified abdominal pain: Secondary | ICD-10-CM | POA: Insufficient documentation

## 2011-03-03 DIAGNOSIS — M79609 Pain in unspecified limb: Secondary | ICD-10-CM | POA: Insufficient documentation

## 2011-03-03 DIAGNOSIS — R071 Chest pain on breathing: Secondary | ICD-10-CM | POA: Insufficient documentation

## 2011-03-03 DIAGNOSIS — E119 Type 2 diabetes mellitus without complications: Secondary | ICD-10-CM | POA: Insufficient documentation

## 2011-03-03 DIAGNOSIS — T148XXA Other injury of unspecified body region, initial encounter: Secondary | ICD-10-CM | POA: Insufficient documentation

## 2011-03-03 DIAGNOSIS — Z794 Long term (current) use of insulin: Secondary | ICD-10-CM | POA: Insufficient documentation

## 2011-03-22 ENCOUNTER — Emergency Department (HOSPITAL_COMMUNITY)
Admission: EM | Admit: 2011-03-22 | Discharge: 2011-03-23 | Disposition: A | Discharge status: Home / Self Care | Payer: Self-pay | Attending: Emergency Medicine | Admitting: Emergency Medicine

## 2011-03-22 DIAGNOSIS — E860 Dehydration: Secondary | ICD-10-CM | POA: Insufficient documentation

## 2011-03-22 DIAGNOSIS — E119 Type 2 diabetes mellitus without complications: Secondary | ICD-10-CM | POA: Insufficient documentation

## 2011-03-22 DIAGNOSIS — Z794 Long term (current) use of insulin: Secondary | ICD-10-CM | POA: Insufficient documentation

## 2011-03-22 LAB — COMPREHENSIVE METABOLIC PANEL
ALT: 11 U/L (ref 0–35)
AST: 11 U/L (ref 0–37)
Albumin: 3.4 g/dL — ABNORMAL LOW (ref 3.5–5.2)
Alkaline Phosphatase: 103 U/L (ref 39–117)
Glucose, Bld: 617 mg/dL (ref 70–99)
Potassium: 3.8 mEq/L (ref 3.5–5.1)
Sodium: 128 mEq/L — ABNORMAL LOW (ref 135–145)
Total Protein: 6.7 g/dL (ref 6.0–8.3)

## 2011-03-22 LAB — GLUCOSE, CAPILLARY: Glucose-Capillary: >600 mg/dL (ref 70–99)

## 2011-03-22 LAB — POCT I-STAT, CHEM 8
Glucose, Bld: 631 mg/dL (ref 70–99)
HCT: 40 % (ref 36.0–46.0)
Hemoglobin: 13.6 g/dL (ref 12.0–15.0)
Potassium: 4 mEq/L (ref 3.5–5.1)
Sodium: 129 mEq/L — ABNORMAL LOW (ref 135–145)

## 2011-03-22 LAB — CBC
HCT: 36.6 % (ref 36.0–46.0)
Platelets: 240 10*3/uL (ref 150–400)
RDW: 14.6 % (ref 11.5–15.5)
WBC: 9.3 10*3/uL (ref 4.0–10.5)

## 2011-03-22 LAB — DIFFERENTIAL
Basophils Absolute: 0 10*3/uL (ref 0.0–0.1)
Eosinophils Relative: 1 % (ref 0–5)
Lymphocytes Relative: 31 % (ref 12–46)
Neutro Abs: 5.6 10*3/uL (ref 1.7–7.7)

## 2011-03-22 LAB — URINALYSIS, ROUTINE W REFLEX MICROSCOPIC
Bilirubin Urine: NEGATIVE
Glucose, UA: >1000 mg/dL — AB
Hgb urine dipstick: NEGATIVE
Ketones, ur: 40 mg/dL — AB
Protein, ur: NEGATIVE mg/dL

## 2011-03-23 LAB — GLUCOSE, CAPILLARY: Glucose-Capillary: 279 mg/dL — ABNORMAL HIGH (ref 70–99)

## 2011-03-28 ENCOUNTER — Inpatient Hospital Stay (HOSPITAL_COMMUNITY)
Admission: EM | Admit: 2011-03-28 | Discharge: 2011-03-29 | DRG: 638 | Disposition: A | Discharge status: Home / Self Care | Payer: Self-pay | Attending: Internal Medicine | Admitting: Internal Medicine

## 2011-03-28 DIAGNOSIS — L02219 Cutaneous abscess of trunk, unspecified: Secondary | ICD-10-CM | POA: Diagnosis present

## 2011-03-28 DIAGNOSIS — IMO0002 Reserved for concepts with insufficient information to code with codable children: Principal | ICD-10-CM | POA: Diagnosis present

## 2011-03-28 DIAGNOSIS — L03319 Cellulitis of trunk, unspecified: Secondary | ICD-10-CM | POA: Diagnosis present

## 2011-03-28 DIAGNOSIS — F17200 Nicotine dependence, unspecified, uncomplicated: Secondary | ICD-10-CM | POA: Diagnosis present

## 2011-03-28 DIAGNOSIS — Z794 Long term (current) use of insulin: Secondary | ICD-10-CM

## 2011-03-28 DIAGNOSIS — E876 Hypokalemia: Secondary | ICD-10-CM | POA: Diagnosis not present

## 2011-03-28 DIAGNOSIS — E1065 Type 1 diabetes mellitus with hyperglycemia: Principal | ICD-10-CM | POA: Diagnosis present

## 2011-03-28 DIAGNOSIS — D649 Anemia, unspecified: Secondary | ICD-10-CM | POA: Diagnosis present

## 2011-03-28 DIAGNOSIS — E871 Hypo-osmolality and hyponatremia: Secondary | ICD-10-CM | POA: Diagnosis present

## 2011-03-28 LAB — CBC
Hemoglobin: 12.8 g/dL (ref 12.0–15.0)
MCHC: 33.3 g/dL (ref 30.0–36.0)
Platelets: 218 10*3/uL (ref 150–400)
RDW: 15.3 % (ref 11.5–15.5)

## 2011-03-28 LAB — URINALYSIS, ROUTINE W REFLEX MICROSCOPIC
Glucose, UA: >1000 mg/dL — AB
Hgb urine dipstick: NEGATIVE
Protein, ur: NEGATIVE mg/dL
pH: 5 (ref 5.0–8.0)

## 2011-03-28 LAB — BASIC METABOLIC PANEL
Calcium: 10.1 mg/dL (ref 8.4–10.5)
Chloride: 83 mEq/L — ABNORMAL LOW (ref 96–112)
Creatinine, Ser: 0.73 mg/dL (ref 0.4–1.2)
GFR calc non Af Amer: >60 mL/min (ref >60)
Sodium: 120 mEq/L — ABNORMAL LOW (ref 135–145)

## 2011-03-28 LAB — DIFFERENTIAL
Basophils Absolute: 0 10*3/uL (ref 0.0–0.1)
Basophils Relative: 0 % (ref 0–1)
Lymphocytes Relative: 17 % (ref 12–46)
Monocytes Absolute: 0.9 10*3/uL (ref 0.1–1.0)
Neutro Abs: 9 10*3/uL — ABNORMAL HIGH (ref 1.7–7.7)
Neutrophils Relative %: 74 % (ref 43–77)

## 2011-03-28 LAB — URINE MICROSCOPIC-ADD ON

## 2011-03-28 LAB — GLUCOSE, CAPILLARY
Glucose-Capillary: >600 mg/dL (ref 70–99)
Glucose-Capillary: 490 mg/dL — ABNORMAL HIGH (ref 70–99)

## 2011-03-29 LAB — GLUCOSE, CAPILLARY
Glucose-Capillary: 111 mg/dL — ABNORMAL HIGH (ref 70–99)
Glucose-Capillary: 74 mg/dL (ref 70–99)
Glucose-Capillary: 212 mg/dL — ABNORMAL HIGH (ref 70–99)
Glucose-Capillary: 308 mg/dL — ABNORMAL HIGH (ref 70–99)
Glucose-Capillary: 112 mg/dL — ABNORMAL HIGH (ref 70–99)

## 2011-03-29 LAB — BASIC METABOLIC PANEL
BUN: 14 mg/dL (ref 6–23)
CO2: 26 mEq/L (ref 19–32)
CO2: 25 mEq/L (ref 19–32)
Calcium: 8.3 mg/dL — ABNORMAL LOW (ref 8.4–10.5)
Chloride: 103 mEq/L (ref 96–112)
Creatinine, Ser: <0.47 mg/dL (ref 0.4–1.2)
Glucose, Bld: 144 mg/dL — ABNORMAL HIGH (ref 70–99)
Glucose, Bld: 176 mg/dL — ABNORMAL HIGH (ref 70–99)
Potassium: 3.1 mEq/L — ABNORMAL LOW (ref 3.5–5.1)
Sodium: 135 mEq/L (ref 135–145)
Sodium: 136 mEq/L (ref 135–145)

## 2011-03-29 LAB — CBC
MCH: 29.9 pg (ref 26.0–34.0)
MCHC: 34.6 g/dL (ref 30.0–36.0)
MCV: 86.5 fL (ref 78.0–100.0)
Platelets: 197 10*3/uL (ref 150–400)
RBC: 3.84 MIL/uL — ABNORMAL LOW (ref 3.87–5.11)

## 2011-03-29 LAB — DIFFERENTIAL
Basophils Relative: 0 % (ref 0–1)
Eosinophils Absolute: 0.3 10*3/uL (ref 0.0–0.7)
Eosinophils Relative: 3 % (ref 0–5)
Lymphs Abs: 3.5 10*3/uL (ref 0.7–4.0)
Monocytes Absolute: 0.8 10*3/uL (ref 0.1–1.0)
Monocytes Relative: 8 % (ref 3–12)

## 2011-03-29 LAB — LIPID PANEL
Cholesterol: 139 mg/dL (ref 0–200)
Triglycerides: 100 mg/dL (ref <150)
VLDL: 20 mg/dL (ref 0–40)

## 2011-03-29 LAB — HEMOGLOBIN A1C: Mean Plasma Glucose: 361 mg/dL — ABNORMAL HIGH (ref <117)

## 2011-03-29 NOTE — H&P (Signed)
NAMEMarland [name]  HERTA, HINK           ACCOUNT NO.:  [id number]  MEDICAL RECORD NO.:  [id number]  LOCATION:  IC07                          FACILITY:  [facility]  PHYSICIAN:  Osvaldo Shipper, MD     DATE OF BIRTH:  1986-06-28  DATE OF ADMISSION:  03/28/2011 DATE OF DISCHARGE:  LH                             HISTORY & PHYSICAL   PRIMARY CARE PHYSICIAN:  [name] tells me [name] used to see physicians in [facility], however, because [name] has lost her Medicaid, [name] has not seen anybody in 5 months.  ADMISSION DIAGNOSES: 1. Hyperglycemia, nonketotic in a patient with diabetes type 1. 2. Abscess over the chest wall that is status post I and D. 3. Poorly controlled diabetes.  CHIEF COMPLAINT:  Pain over her chest wall.  HISTORY OF PRESENT ILLNESS:  The patient is a 25 year old Caucasian female with a past medical history of diabetes who was in her usual state of health about a week ago, when [name] started noticing a boil in the front of her chest.  The boil started increasing in size and in pain intensity.  The pain level got up to 8/10 in intensity.  [name] denied any other symptoms to suggest fever or chills.  Denied any nausea, vomiting, abdominal pain, diarrhea.  The pain got worse to the point that [name] had to seek attention today, so [name] decided to come to the hospital.  [name] underwent I and D by the ED physician and now has a big dressing on her chest wall.  The patient says that [name] has not been able to afford Lantus and so had to switch to insulin 70/30, but does not mention of any noncompliance.  MEDICATIONS AT HOME: 1. Insulin 70/30 25 units b.i.d. 2. NovoLog and a sliding scale. 3. Ibuprofen as needed for pain.  ALLERGIES:  CODEINE causes nausea, vomiting.  PENICILLIN causes vomiting.  PAST MEDICAL HISTORY:  Positive for diabetes for the last 4 years.  [name] has had tubal ligation in the past.  SOCIAL HISTORY:  Lives in [place] with her husband and 2 daughters, does not work.  Smokes 1  pack of cigarettes on a daily basis.  No alcohol use.  No illicit drug use.  FAMILY HISTORY:  Father died of a heart attack at the age of 51.  Mother has coronary artery disease with stents.  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  REVIEW OF SYSTEMS:  GENERAL:  Positive for weakness.  HEENT: Unremarkable.  CARDIOVASCULAR:  Unremarkable.  RESPIRATORY: Unremarkable.  GI:  Unremarkable.  GU:  Unremarkable.  NEUROLOGIC: Unremarkable.  PSYCHIATRIC:  Unremarkable.  DERMATOLOGIC:  As in HPI. Other systems reviewed and found to be negative.  PHYSICAL EXAMINATION:  VITAL SIGNS:  Temperature 98.3, blood pressure 124/84, heart rate 67, respiratory rate is 14, saturation 98% on room air. GENERAL:  This is a thin white female, in no distress. HEENT:  Head is normocephalic, atraumatic.  Pupils are equal reacting. No pallor, no icterus.  Oral mucous membranes moist.  No oral lesions noted. NECK:  Soft and supple.  No thyromegaly is appreciated.  No cervical, supraclavicular, or inguinal lymphadenopathy is present. LUNGS:  Clear to auscultation bilaterally with no wheezing,  rales, or rhonchi. CARDIOVASCULAR:  S1 and S2 is normal, regular.  No S3, S4, rubs, murmurs, or bruits.  Chest wall has a dressing on it since it was recently I and D'd.  I did not remove the dressings. ABDOMEN:  Soft, nontender, nondistended.  Bowel sounds are present.  No masses or organomegaly is appreciated. GU:  Deferred. MUSCULOSKELETAL:  Normal muscle mass and tone. NEUROLOGIC:  [name] is alert, oriented x3.  No focal neurological deficits are present. SKIN:  Except for the wound over the chest wall which is covered now with dressing.  No other rashes are present.  LAB DATA:  Her white cell count is 12.2 with normal differential.  Rest of her parameters are normal.  BMET showed potassium of 4.2, chloride of 83, bicarb of 47, sodium is 120.  Her corrected sodium for a glucose which was 1042 is 135.  BUN and  creatinine are normal.  Calcium is 10.1. HbA1c from February 2012 was 12.6.  [name] has not had urine pregnancy test here.  No imaging studies have been done.  ASSESSMENT:  This is a 25 year old Caucasian female who presents to the hospital with boil/abscess over her chest wall.  According to the ED notes, it was 3 x 3 cm.  [name] also has hyperglycemia without being in ketosis.  PLAN: 1. Chest wall skin abscess.  This has been I and D'd by the ED     physician.  We will put her on IV vancomycin for now and [name] should     be able to transition to oral doxycycline when [name] is ready     for discharge. 2. Hyperglycemia, nonketotic in a diabetic.  We will be putting her on     insulin drip.  We will check her HbA1c.  [name] will be seen by     inpatient diabetes coordinator, diabetes educator.  Even though [name]     tells me that [name] has been compliant, it is possible that her     dosage may not be adequate.  I will also have her seen by our     social worker and case manager because of financial issues and the     fact that [name] has lost her Medicaid.  [name] will need to follow up     with a primary care physician which we will also try to facilitate. 3. DVT prophylaxis will be initiated. 4. Her UA will be checked. 5. A lipid panel will be checked.  Further management decisions will depend on results of further testing and the patient's response to treatment.  Osvaldo Shipper, MD     GK/MEDQ  D:  03/28/2011  T:  03/28/2011  Job:  [id number]  Electronically Signed by Osvaldo Shipper MD on 03/29/2011 81:19:14 PM

## 2011-03-30 LAB — GLUCOSE, CAPILLARY: Glucose-Capillary: 219 mg/dL — ABNORMAL HIGH (ref 70–99)

## 2011-03-30 NOTE — Discharge Summary (Signed)
[name], [name]           ACCOUNT NO.:  [id number]  MEDICAL RECORD NO.:  [id number]  LOCATION:  IC07                          FACILITY:  [facility]  PHYSICIAN:  Elliot Cousin, M.D.    DATE OF BIRTH:  04/15/1986  DATE OF ADMISSION:  03/28/2011 DATE OF DISCHARGE:  06/12/2012LH                              DISCHARGE SUMMARY   DISCHARGE DIAGNOSES: 1. Chest wall abscess, status post incision and drainage by the     emergency department physician Dr. Estell Harpin. 2. Non-ketotic hyperosmolar hyperglycemia in a patient with     uncontrolled type 1 diabetes mellitus.  The patient's venous     glucose was 1042 on admission. 3. Hyponatremia, secondary to severe hyperglycemia. 4. Mild normocytic anemia.  DISCHARGE MEDICATIONS: 1. Bactrim DS 800/160 mg 1 tablet b.i.d. for 12 more days. 2. Percocet 5/325 mg 1 tablet every 4 hours as needed for pain (20     tablets written for with no refills). 3. Ibuprofen 200 mg 2 tablets every 6 hours as needed for pain. 4. NovoLog Mix 70/30 insulin 25 units subcu b.i.d.  DISCHARGE DISPOSITION:  The patient was discharged to home in improved and stable condition on March 29, 2011.  She was advised to follow up with the emergency department physician in 2 days as previously instructed.  She will follow up with the [facility] primary care provider on April 12, 2011, at 8:30 a.m.  CONSULTATIONS:  None.  PROCEDURE PERFORMED:  None.  HISTORY OF PRESENTING ILLNESS:  The patient is a 25 year old woman with a past medical history significant for type 1 diabetes mellitus, who presented to the emergency department on March 28, 2011 with a chief complaint of pain over her chest wall.  Approximately 1 week ago, she noticed a boil on her chest wall.  It began to increase in size and pain.  She denied associated fever or chills.  During the initial evaluation, the ED physician performed an I and D of the boil and applied a dressing to the  site.  In the meantime, laboratory studies were ordered.  Her venous glucose was found to be elevated at 1042, her serum sodium was low at 120, her CO2 was within normal limits at 27, and her renal function was within normal limits.  She was admitted for further evaluation and management.  HOSPITAL COURSE:  She was started on an insulin drip via the non-DKA insulin drip Glucommander protocol.  IV fluids were initiated with normal saline for hydration.  Intravenous vancomycin was ordered for treatment of the abscess/celllulitis.  She was afebrile on admission and remained afebrile throughout the hospitalization.  Her white blood cell count was elevated at 12.2 on admission.  It normalized to 9.9 prior to discharge. The patient acknowledged that she had been noncompliant with insulin because she could not afford Lantus.  Apparently, the emergency department physician changed her insulin to 70/30 which she stated she could afford.  She had not taken any insulin prior to her presentation to the emergency department.  Once the patient's venous glucose improved, the insulin drip protocol was discontinued.  Treatment was transitioned to 70/30 insulin twice daily in addition to sensitive scale sliding scale NovoLog. She  received two doses of vancomycin during the hospitalization.  She was discharged on 12 more days of Bactrim DS.  At hospital discharge, her serum sodium was 136, serum potassium 3.4, chloride 104, BUN 11, and creatinine was less than 0.47.  She was given 40 mEq of potassium chloride.   The patient's dressing was changed by the registered nurse before discharge.  She was advised to continue antibiotic therapy  and to follow up with the emergency department physician in 2 days as previously instructed for evaluation of the I and D site. She voiced understanding.     Elliot Cousin, M.D.     DF/MEDQ  D:  03/29/2011  T:  03/30/2011  Job:  [id number]  cc:   [facility]  Electronically Signed by Elliot Cousin M.D. on 03/30/2011 09:50:56 PM

## 2011-03-31 ENCOUNTER — Emergency Department (HOSPITAL_COMMUNITY)
Admission: EM | Admit: 2011-03-31 | Discharge: 2011-03-31 | Disposition: A | Discharge status: Home / Self Care | Payer: Self-pay | Attending: Emergency Medicine | Admitting: Emergency Medicine

## 2011-03-31 DIAGNOSIS — J869 Pyothorax without fistula: Secondary | ICD-10-CM | POA: Insufficient documentation

## 2011-03-31 DIAGNOSIS — Z4800 Encounter for change or removal of nonsurgical wound dressing: Secondary | ICD-10-CM | POA: Insufficient documentation

## 2011-05-01 ENCOUNTER — Emergency Department (HOSPITAL_COMMUNITY): Payer: Medicaid Other

## 2011-05-01 ENCOUNTER — Inpatient Hospital Stay (HOSPITAL_COMMUNITY)
Admission: EM | Admit: 2011-05-01 | Discharge: 2011-05-03 | DRG: 638 | Disposition: A | Discharge status: Home / Self Care | Payer: Medicaid Other | Attending: Internal Medicine | Admitting: Internal Medicine

## 2011-05-01 ENCOUNTER — Other Ambulatory Visit: Payer: Self-pay

## 2011-05-01 DIAGNOSIS — Z91199 Patient's noncompliance with other medical treatment and regimen due to unspecified reason: Secondary | ICD-10-CM

## 2011-05-01 DIAGNOSIS — E1069 Type 1 diabetes mellitus with other specified complication: Secondary | ICD-10-CM

## 2011-05-01 DIAGNOSIS — D72829 Elevated white blood cell count, unspecified: Secondary | ICD-10-CM | POA: Diagnosis present

## 2011-05-01 DIAGNOSIS — Z794 Long term (current) use of insulin: Secondary | ICD-10-CM

## 2011-05-01 DIAGNOSIS — E871 Hypo-osmolality and hyponatremia: Secondary | ICD-10-CM | POA: Diagnosis present

## 2011-05-01 DIAGNOSIS — E1110 Type 2 diabetes mellitus with ketoacidosis without coma: Secondary | ICD-10-CM

## 2011-05-01 DIAGNOSIS — F17200 Nicotine dependence, unspecified, uncomplicated: Secondary | ICD-10-CM | POA: Diagnosis present

## 2011-05-01 DIAGNOSIS — E1010 Type 1 diabetes mellitus with ketoacidosis without coma: Principal | ICD-10-CM | POA: Diagnosis present

## 2011-05-01 DIAGNOSIS — R0789 Other chest pain: Secondary | ICD-10-CM | POA: Diagnosis present

## 2011-05-01 DIAGNOSIS — Z9119 Patient's noncompliance with other medical treatment and regimen: Secondary | ICD-10-CM

## 2011-05-01 HISTORY — DX: Type 2 diabetes mellitus with ketoacidosis without coma: E11.10

## 2011-05-01 LAB — BLOOD GAS, ARTERIAL
Acid-base deficit: 19.7 mmol/L — ABNORMAL HIGH (ref 0.0–2.0)
Bicarbonate: 7.1 mEq/L — ABNORMAL LOW (ref 20.0–24.0)
O2 Saturation: 96.7 %
pO2, Arterial: 116 mmHg — ABNORMAL HIGH (ref 80.0–100.0)

## 2011-05-01 LAB — CBC
HCT: 45.1 % (ref 36.0–46.0)
RDW: 13.8 % (ref 11.5–15.5)
WBC: 14.4 10*3/uL — ABNORMAL HIGH (ref 4.0–10.5)

## 2011-05-01 LAB — TROPONIN I: Troponin I: <0.3 ng/mL (ref <0.30)

## 2011-05-01 LAB — GLUCOSE, CAPILLARY
Glucose-Capillary: >600 mg/dL (ref 70–99)
Glucose-Capillary: 471 mg/dL — ABNORMAL HIGH (ref 70–99)
Glucose-Capillary: 261 mg/dL — ABNORMAL HIGH (ref 70–99)
Glucose-Capillary: 223 mg/dL — ABNORMAL HIGH (ref 70–99)
Glucose-Capillary: 203 mg/dL — ABNORMAL HIGH (ref 70–99)

## 2011-05-01 LAB — BASIC METABOLIC PANEL
BUN: 10 mg/dL (ref 6–23)
BUN: 10 mg/dL (ref 6–23)
CO2: 9 mEq/L — CL (ref 19–32)
CO2: 15 mEq/L — ABNORMAL LOW (ref 19–32)
CO2: 20 mEq/L (ref 19–32)
Calcium: 8.4 mg/dL (ref 8.4–10.5)
Chloride: 86 mEq/L — ABNORMAL LOW (ref 96–112)
Chloride: 99 mEq/L (ref 96–112)
Creatinine, Ser: 0.73 mg/dL (ref 0.50–1.10)
Creatinine, Ser: 0.62 mg/dL (ref 0.50–1.10)
GFR calc Af Amer: >60 mL/min (ref >60)
Glucose, Bld: 201 mg/dL — ABNORMAL HIGH (ref 70–99)
Potassium: 3.5 mEq/L (ref 3.5–5.1)
Sodium: 126 mEq/L — ABNORMAL LOW (ref 135–145)
Sodium: 130 mEq/L — ABNORMAL LOW (ref 135–145)

## 2011-05-01 LAB — URINALYSIS, ROUTINE W REFLEX MICROSCOPIC
Glucose, UA: >1000 mg/dL — AB
Leukocytes, UA: NEGATIVE
Specific Gravity, Urine: >1.03 — ABNORMAL HIGH (ref 1.005–1.030)
pH: 5 (ref 5.0–8.0)

## 2011-05-01 LAB — URINE MICROSCOPIC-ADD ON

## 2011-05-01 LAB — POCT PREGNANCY, URINE: Preg Test, Ur: NEGATIVE

## 2011-05-01 LAB — DIFFERENTIAL
Basophils Absolute: 0 10*3/uL (ref 0.0–0.1)
Lymphocytes Relative: 8 % — ABNORMAL LOW (ref 12–46)
Monocytes Absolute: 0.9 10*3/uL (ref 0.1–1.0)
Neutro Abs: 12.3 10*3/uL — ABNORMAL HIGH (ref 1.7–7.7)

## 2011-05-01 LAB — MRSA PCR SCREENING: MRSA by PCR: POSITIVE — AB

## 2011-05-01 LAB — CARDIAC PANEL(CRET KIN+CKTOT+MB+TROPI)
CK, MB: 1.8 ng/mL (ref 0.3–4.0)
Relative Index: INVALID (ref 0.0–2.5)
Total CK: 50 U/L (ref 7–177)

## 2011-05-01 MED ORDER — DEXTROSE 50 % IV SOLN: 25.0000 mL | INTRAVENOUS | Status: DC | PRN

## 2011-05-01 MED ORDER — INSULIN REGULAR HUMAN 100 UNIT/ML IJ SOLN
INTRAMUSCULAR | Status: DC
  Administered 2011-05-01: 5 [IU]/h via INTRAVENOUS
  Filled 2011-05-01: qty 1

## 2011-05-01 MED ORDER — MUPIROCIN 2 % EX OINT
1.0000 "application " | TOPICAL_OINTMENT | Freq: Two times a day (BID) | CUTANEOUS | Status: DC
  Administered 2011-05-01 – 2011-05-03 (×3): 1 via NASAL
  Filled 2011-05-01: qty 22

## 2011-05-01 MED ORDER — INSULIN ASPART 100 UNIT/ML ~~LOC~~ SOLN
0.0000 [IU] | Freq: Three times a day (TID) | SUBCUTANEOUS | Status: DC
  Administered 2011-05-02: 0 [IU] via SUBCUTANEOUS
  Filled 2011-05-01: qty 3

## 2011-05-01 MED ORDER — ONDANSETRON HCL 4 MG/2ML IJ SOLN
4.0000 mg | Freq: Four times a day (QID) | INTRAMUSCULAR | Status: DC | PRN
  Administered 2011-05-01 – 2011-05-02 (×3): 4 mg via INTRAVENOUS
  Filled 2011-05-01 (×4): qty 2

## 2011-05-01 MED ORDER — MORPHINE SULFATE 2 MG/ML IJ SOLN
2.0000 mg | INTRAMUSCULAR | Status: DC | PRN
  Administered 2011-05-01 – 2011-05-03 (×4): 2 mg via INTRAVENOUS
  Filled 2011-05-01 (×5): qty 1

## 2011-05-01 MED ORDER — OXYCODONE-ACETAMINOPHEN 5-325 MG PO TABS
2.0000 | ORAL_TABLET | Freq: Once | ORAL | Status: AC
  Administered 2011-05-01: 2 via ORAL
  Filled 2011-05-01: qty 2

## 2011-05-01 MED ORDER — POTASSIUM CHLORIDE CRYS ER 10 MEQ PO TBCR: 10.0000 meq | EXTENDED_RELEASE_TABLET | Freq: Two times a day (BID) | ORAL | Status: DC

## 2011-05-01 MED ORDER — INSULIN ASPART 100 UNIT/ML ~~LOC~~ SOLN: 0.0000 [IU] | SUBCUTANEOUS | Status: DC

## 2011-05-01 MED ORDER — DEXTROSE-NACL 5-0.45 % IV SOLN
INTRAVENOUS | Status: DC
  Administered 2011-05-01: 18:00:00 via INTRAVENOUS

## 2011-05-01 MED ORDER — SODIUM CHLORIDE 0.9 % IV SOLN
INTRAVENOUS | Status: DC
  Administered 2011-05-02: 01:00:00 via INTRAVENOUS

## 2011-05-01 MED ORDER — INSULIN ASPART 100 UNIT/ML ~~LOC~~ SOLN: 0.0000 [IU] | Freq: Three times a day (TID) | SUBCUTANEOUS | Status: DC

## 2011-05-01 MED ORDER — SODIUM CHLORIDE 0.9 % IV SOLN: INTRAVENOUS | Status: DC

## 2011-05-01 MED ORDER — POTASSIUM CHLORIDE 10 MEQ/100ML IV SOLN
10.0000 meq | INTRAVENOUS | Status: AC
  Administered 2011-05-01 (×2): 10 meq via INTRAVENOUS
  Filled 2011-05-01 (×2): qty 100

## 2011-05-01 MED ORDER — SODIUM CHLORIDE 0.9 % IV SOLN
INTRAVENOUS | Status: DC
  Administered 2011-05-01: 4.1 [IU]/h via INTRAVENOUS
  Filled 2011-05-01: qty 1

## 2011-05-01 MED ORDER — INSULIN GLARGINE 100 UNIT/ML ~~LOC~~ SOLN
50.0000 [IU] | Freq: Once | SUBCUTANEOUS | Status: AC
  Administered 2011-05-01: 50 [IU] via SUBCUTANEOUS
  Filled 2011-05-01: qty 3

## 2011-05-01 MED ORDER — ZOLPIDEM TARTRATE 5 MG PO TABS: 5.0000 mg | ORAL_TABLET | Freq: Every evening | ORAL | Status: DC | PRN

## 2011-05-01 MED ORDER — INSULIN ASPART 100 UNIT/ML ~~LOC~~ SOLN: 0.0000 [IU] | Freq: Every day | SUBCUTANEOUS | Status: DC

## 2011-05-01 MED ORDER — SODIUM CHLORIDE 0.9 % IV BOLUS (SEPSIS)
1000.0000 mL | Freq: Once | INTRAVENOUS | Status: AC
  Administered 2011-05-01: 1000 mL via INTRAVENOUS

## 2011-05-01 MED ORDER — DEXTROSE-NACL 5-0.45 % IV SOLN
INTRAVENOUS | Status: DC
  Administered 2011-05-01: 19:00:00 via INTRAVENOUS

## 2011-05-01 MED ORDER — CHLORHEXIDINE GLUCONATE CLOTH 2 % EX PADS
6.0000 | MEDICATED_PAD | CUTANEOUS | Status: DC
  Administered 2011-05-02 – 2011-05-03 (×2): 6 via TOPICAL

## 2011-05-01 MED ORDER — POTASSIUM CHLORIDE 10 MEQ/100ML IV SOLN
10.0000 meq | INTRAVENOUS | Status: DC
  Administered 2011-05-01: 10 meq via INTRAVENOUS

## 2011-05-01 MED ORDER — ACETAMINOPHEN 325 MG PO TABS
650.0000 mg | ORAL_TABLET | Freq: Four times a day (QID) | ORAL | Status: DC | PRN
  Administered 2011-05-01: 650 mg via ORAL
  Filled 2011-05-01: qty 2

## 2011-05-01 MED ORDER — INSULIN ASPART 100 UNIT/ML ~~LOC~~ SOLN
0.0000 [IU] | Freq: Every day | SUBCUTANEOUS | Status: DC
  Administered 2011-05-02: 0 [IU] via SUBCUTANEOUS

## 2011-05-01 MED ORDER — ONDANSETRON 8 MG PO TBDP
8.0000 mg | ORAL_TABLET | Freq: Once | ORAL | Status: AC
  Administered 2011-05-01: 8 mg via ORAL
  Filled 2011-05-01: qty 1

## 2011-05-01 MED ORDER — SODIUM CHLORIDE 0.9 % IJ SOLN
INTRAMUSCULAR | Status: AC
  Administered 2011-05-01: 12:00:00
  Filled 2011-05-01: qty 40

## 2011-05-01 NOTE — ED Provider Notes (Addendum)
History     Chief Complaint  Patient presents with  . Chest Pain   Patient is a 25 y.o. female presenting with chest pain. The history is provided by the patient.  Chest Pain The chest pain began yesterday. Duration of episode(s) is 5 seconds. Chest pain occurs intermittently. Progression since onset: Recurrent. Associated with: Not associated with breathing coughing or exertion. Pain is worse with palpation of the chest wall. The severity of the pain is severe. The quality of the pain is described as sharp and stabbing. The pain does not radiate. Exacerbated by: Palpation. Primary symptoms include nausea and vomiting. Pertinent negatives for primary symptoms include no fever, no fatigue, no syncope, no shortness of breath, no cough, no wheezing, no palpitations, no abdominal pain and no dizziness. Primary symptoms comment: The patient reports 2 episodes of nausea and vomiting yesterday not associated with chest pain, she did not have chest pain when she had the nausea and vomiting.  Pertinent negatives for associated symptoms include no claudication, no diaphoresis, no lower extremity edema, no near-syncope, no numbness and no orthopnea. She tried nothing for the symptoms. Risk factors include smoking/tobacco exposure.     Past Medical History  Diagnosis Date  . Diabetes mellitus     Past Surgical History  Procedure Date  . Tubal ligation     History reviewed. No pertinent family history.  History  Substance Use Topics  . Smoking status: Current Everyday Smoker -- 0.5 packs/day    Types: Cigarettes  . Smokeless tobacco: Not on file  . Alcohol Use: No    OB History    Grav Para Term Preterm Abortions TAB SAB Ect Mult Living   2 2              Review of Systems  Constitutional: Negative for fever, diaphoresis and fatigue.  Respiratory: Negative for cough, shortness of breath and wheezing.   Cardiovascular: Positive for chest pain. Negative for palpitations, orthopnea,  claudication, syncope and near-syncope.  Gastrointestinal: Positive for nausea and vomiting. Negative for abdominal pain.  Neurological: Negative for dizziness and numbness.  All other systems reviewed and are negative.    Physical Exam  BP 135/90  Pulse 129  Temp(Src) 97.7 F (36.5 C) (Oral)  Resp 20  Ht 5' (1.524 m)  Wt 110 lb (49.896 kg)  BMI 21.48 kg/m2  SpO2 100%  LMP 04/17/2011  Physical Exam  Nursing note and vitals reviewed. Constitutional: She is oriented to person, place, and time. She appears well-developed and well-nourished. No distress.  HENT:  Head: Normocephalic and atraumatic.  Eyes: Pupils are equal, round, and reactive to light.  Neck: Normal range of motion. Neck supple. No JVD present. No tracheal deviation present.  Cardiovascular: Normal rate, regular rhythm, normal heart sounds and intact distal pulses.  Exam reveals no gallop and no friction rub.   No murmur heard. Pulmonary/Chest: Effort normal and breath sounds normal. No stridor. No respiratory distress. She exhibits tenderness.       Chest pain is reproduced with palpation of the left chest wall and axillary chest wall.  Abdominal: Soft. Bowel sounds are normal. She exhibits no distension. There is no tenderness. There is no rebound and no guarding.  Musculoskeletal: Normal range of motion. She exhibits no edema and no tenderness.  Neurological: She is alert and oriented to person, place, and time.  Skin: Skin is warm and dry. She is not diaphoretic.  Psychiatric: She has a normal mood and affect.    ED  Course  Procedures  MDM The differential diagnosis for this patient includes chest wall pain, pleuritic pain, gastrointestinal chest pain, it GERD, myocardial infarction, pneumonia, and urinary tract infection among other possible etiologies for the patient's symptoms. Based on examination chest wall pain appears to be the likely diagnosis. Myocardial infarction is very low in probability given the  patient's lack lack of risk factors, and atypical chest pain, as well as my ability to reproduce the pain with palpation of the chest wall.        Felisa Bonier, MD 05/01/11 1404   Date: 05/01/2011  Rate: 80  Rhythm: normal sinus rhythm  QRS Axis: normal  Intervals: normal  ST/T Wave abnormalities: normal  Conduction Disutrbances:none  Narrative Interpretation:   Old EKG Reviewed: unchanged  The patient's lab results have returned showing a surprise hyperglycemia with ketones this and an anion gap suggestive of diabetic ketoacidosis. This is likely the cause of the patient's nausea and vomiting. I will start her on insulin drip, and seek admission.   Felisa Bonier, MD 05/01/11 [phone]

## 2011-05-01 NOTE — ED Notes (Signed)
Patient is resting comfortably. Resp even and unlabored. Awaiting orders from MD

## 2011-05-01 NOTE — ED Notes (Signed)
Dr. Doylene Canard notified of critical glucose 577 and CO2 9.

## 2011-05-01 NOTE — H&P (Signed)
[name] [id number] 27-Aug-1986  Referring physician: Ardeen Fillers, M.D. PCP: Currently none  Chief Complaint: Nausea and vomiting  HPI: This is a 25 year old woman who presented the emergency room with a several day history of generalized malaise, tiredness. She has been out of her Lantus for the last 3-4 days. She also has sliding scale insulin, however, she has only been using this approximately once per day. Yesterday she developed nausea and vomiting. She also developed sharp chest pains yesterday. These last a few minutes are located on the left side. There is no associated nausea or vomiting, no shortness of breath and no diaphoresis. 6 ibuprofen yesterday did not help. She came to the emergency room for further evaluation today.  In the emergency room an EKG was performed which was unremarkable. The bony was negative. Chest x-ray is negative. However she was found to be in DKA. She was given 2 L of normal saline and started on insulin infusion. Chest pain is currently resolved.  The patient has a history of noncompliance and was recently admitted in June of this year with nonketotic hyperosmolar hyperglycemia with a blood sugar of 1042. She has most recently been on Lantus, however, she cannot afford this medication. She has lost her Medicaid and has not been able to follow with her primary care physician. However she is currently separated from her husband and believes that she can obtain Medicaid again.  In June of this year she also had a chest wall abscess. She recently completed her antibiotics approximately 4 days ago. She feels that this is healed well.  Past Medical History  Diagnosis Date  . Diabetes mellitus   . DKA 05/01/2011   Past Surgical History  Procedure Date  . Tubal ligation    Social History:  reports that she has been smoking Cigarettes.  She has been smoking about .5 packs per day. She has never used smokeless tobacco. She reports that she does not drink  alcohol or use illicit drugs.  Allergies:  Allergies  Allergen Reactions  . Codeine Nausea And Vomiting  . Penicillins Swelling  . Tramadol Itching   Family History  Problem Relation Age of Onset  . Coronary artery disease Mother   . Heart attack Father 10   Prior to Admission medications   Medication Sig Start Date End Date Taking? Authorizing Provider  insulin aspart (NOVOLOG) 100 UNIT/ML injection Inject 10 Units into the skin 3 (three) times daily before meals.     Yes Historical Provider, MD  insulin glargine (LANTUS) 100 UNIT/ML injection Inject 40 Units into the skin at bedtime.     Yes Historical Provider, MD   Note that she has been out of her Lantus for approximately 4 days now.  Review of Systems:  Negative for fever, changes to her vision, sore throat, rash, new muscle aches, shortness of breath, abdominal pain, diarrhea, dysuria, bleeding. Positive for vomiting x2 today.  Physical Exam: General:  Well-appearing, no acute distress. Does not appear ill. Eyes: Pupils are equal round and reactive to light. Normal lids, irises, conjunctiva ENT: Hearing is roughly normal. Upper and lower dentures in place. Normal lips. Neck: Supple. No lymphadenopathy, masses or thyromegaly the Cardiovascular: Regular rate and rhythm. No murmur, rub, gallop. No lower extremity edema. Respiratory: Clear to auscultation bilaterally. No wheezes, rales or rhonchi. Abdomen: Soft, nontender, nondistended. Skin: No rash or induration noted. This either her previous chest wall abscess is well healed. Musculoskeletal: Grossly unremarkable. Lower extremity digits appear unremarkable. Tone and strength  appear grossly normal. Psychiatric: Grossly normal mood and affect. Speech is fluent and appropriate. Neurologic: Grossly unremarkable.  Labs on Admission:  Results for orders placed during the hospital encounter of 05/01/11 (from the past 24 hour(s))  GLUCOSE, CAPILLARY     Status: Abnormal    Collection Time   05/01/11 11:54 AM      Component Value Range   Glucose-Capillary >600 (*) 70 - 99 (mg/dL)  CBC     Status: Abnormal   Collection Time   05/01/11  1:18 PM      Component Value Range   WBC 14.4 (*) 4.0 - 10.5 (K/uL)   RBC 5.16 (*) 3.87 - 5.11 (MIL/uL)   Hemoglobin 15.6 (*) 12.0 - 15.0 (g/dL)   HCT 16.1  09.6 - 04.5 (%)   MCV 87.4  78.0 - 100.0 (fL)   MCH 30.2  26.0 - 34.0 (pg)   MCHC 34.6  30.0 - 36.0 (g/dL)   RDW 40.9  81.1 - 91.4 (%)   Platelets 237  150 - 400 (K/uL)  DIFFERENTIAL     Status: Abnormal   Collection Time   05/01/11  1:18 PM      Component Value Range   Neutrophils Relative 86 (*) 43 - 77 (%)   Neutro Abs 12.3 (*) 1.7 - 7.7 (K/uL)   Lymphocytes Relative 8 (*) 12 - 46 (%)   Lymphs Abs 1.2  0.7 - 4.0 (K/uL)   Monocytes Relative 6  3 - 12 (%)   Monocytes Absolute 0.9  0.1 - 1.0 (K/uL)   Eosinophils Relative 0  0 - 5 (%)   Eosinophils Absolute 0.0  0.0 - 0.7 (K/uL)   Basophils Relative 0  0 - 1 (%)   Basophils Absolute 0.0  0.0 - 0.1 (K/uL)  BASIC METABOLIC PANEL     Status: Abnormal   Collection Time   05/01/11  1:18 PM      Component Value Range   Sodium 126 (*) 135 - 145 (mEq/L)   Potassium 4.7  3.5 - 5.1 (mEq/L)   Chloride 86 (*) 96 - 112 (mEq/L)   CO2 9 (*) 19 - 32 (mEq/L)   Glucose, Bld 577 (*) 70 - 99 (mg/dL)   BUN 13  6 - 23 (mg/dL)   Creatinine, Ser 7.82  0.50 - 1.10 (mg/dL)   Calcium 9.6  8.4 - 95.6 (mg/dL)   GFR calc non Af Amer >60  >60 (mL/min)   GFR calc Af Amer >60  >60 (mL/min)  TROPONIN I     Status: Normal   Collection Time   05/01/11  1:18 PM      Component Value Range   Troponin I <0.30  <0.30 (ng/mL)  URINALYSIS, ROUTINE W REFLEX MICROSCOPIC     Status: Abnormal   Collection Time   05/01/11  1:23 PM      Component Value Range   Color, Urine YELLOW  YELLOW    Appearance CLEAR  CLEAR    Specific Gravity, Urine >1.030 (*) 1.005 - 1.030    pH 5.0  5.0 - 8.0    Glucose, UA >1000 (*) NEGATIVE (mg/dL)   Hgb urine dipstick  NEGATIVE  NEGATIVE    Bilirubin Urine NEGATIVE  NEGATIVE    Ketones, ur >80 (*) NEGATIVE (mg/dL)   Protein, ur NEGATIVE  NEGATIVE (mg/dL)   Urobilinogen, UA 0.2  0.0 - 1.0 (mg/dL)   Nitrite NEGATIVE  NEGATIVE    Leukocytes, UA NEGATIVE  NEGATIVE   URINE MICROSCOPIC-ADD ON  Status: Abnormal   Collection Time   05/01/11  1:23 PM      Component Value Range   Squamous Epithelial / LPF FEW (*) RARE    WBC, UA 0-2  <3 (WBC/hpf)   Bacteria, UA FEW (*) RARE    Urine-Other FEW YEAST    POCT PREGNANCY, URINE     Status: Normal   Collection Time   05/01/11  1:52 PM      Component Value Range   Preg Test, Ur NEGATIVE    GLUCOSE, CAPILLARY     Status: Abnormal   Collection Time   05/01/11  2:29 PM      Component Value Range   Glucose-Capillary 471 (*) 70 - 99 (mg/dL)  BLOOD GAS, ARTERIAL     Status: Abnormal   Collection Time   05/01/11  2:40 PM      Component Value Range   FIO2 .21     Delivery systems ROOM AIR     pH, Arterial 7.192 (*) 7.350 - 7.400    pCO2 19.1 (*) 35.0 - 45.0 (mmHg)   pO2, Arterial 116.0 (*) 80.0 - 100.0 (mmHg)   Bicarbonate 7.1 (*) 20.0 - 24.0 (mEq/L)   TCO2 6.5  0 - 100 (mmol/L)   Acid-base deficit 19.7 (*) 0.0 - 2.0 (mmol/L)   O2 Saturation 96.7     Patient temperature 37.0     Collection site LEFT BRACHIAL     Drawn by COLLECTED BY RT     Sample type ARTERIAL     Allens test (pass/fail) PASS  PASS   GLUCOSE, CAPILLARY     Status: Abnormal   Collection Time   05/01/11  3:34 PM      Component Value Range   Glucose-Capillary 319 (*) 70 - 99 (mg/dL)  Note that I have reviewed the above laboratory studies.  Radiological Exams on Admission: Chest x-ray: No acute disease. Independently reviewed.   EKG: Normal sinus rhythm. No acute changes. Independently reviewed.  Assessment/Plan #1: DKA. Although the patient is markedly acidotic, she appears quite well. We will admit to the step down area for IV insulin infusion. Presumed etiology would be noncompliance  with her medication and she has been out of Lantus for 4 days now. While she did have some chest pain, this appears to be atypical in nature. Her EKG is reassuring and troponin is negative. We will continue cardiac enzymes however I doubt acute coronary syndrome. #2: Uncontrolled diabetes mellitus type 1. Suspect poor compliance. She cannot afford Lantus right now. Will place her on 70/30 insulin once DKA has resolved. Inpatient diabetes RN coordinator consult. #3: Atypical chest pain. Plan as above. Suspect GI in nature. Place on PPI. #4: Leukocytosis. Probably a stress reaction. Will recheck a CBC in the morning. No evidence of infection at this time. #5: Noncompliance. Will consult case management social work. Hopefully this patient will be able to obtain Medicaid again. #3: Pseudohyponatremia. Treat underlying condition.   Code Status: Full Disposition Plan: Home when improved.      Pandora Leiter 05/01/2011, 3:47 PM

## 2011-05-01 NOTE — ED Notes (Signed)
Resting quietly in bed. Admission md at bedside. Changed insulin gtt rate per protocol

## 2011-05-01 NOTE — ED Notes (Signed)
Pt presents with left sided rib pain and under left arm pain. Pt diabetic and states she has been out of insulin for 2 days " I don't have insurance and no medicaid". Pt also c/o nausea. Pt states" this is the worst I've ever felt before.'

## 2011-05-02 LAB — BASIC METABOLIC PANEL
CO2: 22 mEq/L (ref 19–32)
Calcium: 8 mg/dL — ABNORMAL LOW (ref 8.4–10.5)
Calcium: 8.1 mg/dL — ABNORMAL LOW (ref 8.4–10.5)
Creatinine, Ser: 0.49 mg/dL — ABNORMAL LOW (ref 0.50–1.10)
Creatinine, Ser: <0.47 mg/dL — ABNORMAL LOW (ref 0.50–1.10)
GFR calc Af Amer: >60 mL/min (ref >60)
GFR calc non Af Amer: >60 mL/min (ref >60)
Glucose, Bld: 103 mg/dL — ABNORMAL HIGH (ref 70–99)
Sodium: 129 mEq/L — ABNORMAL LOW (ref 135–145)

## 2011-05-02 LAB — GLUCOSE, CAPILLARY
Glucose-Capillary: 121 mg/dL — ABNORMAL HIGH (ref 70–99)
Glucose-Capillary: 115 mg/dL — ABNORMAL HIGH (ref 70–99)
Glucose-Capillary: 245 mg/dL — ABNORMAL HIGH (ref 70–99)
Glucose-Capillary: 164 mg/dL — ABNORMAL HIGH (ref 70–99)

## 2011-05-02 LAB — CARDIAC PANEL(CRET KIN+CKTOT+MB+TROPI)
CK, MB: 1.9 ng/mL (ref 0.3–4.0)
Relative Index: INVALID (ref 0.0–2.5)
Total CK: 56 U/L (ref 7–177)
Troponin I: <0.3 ng/mL (ref <0.30)

## 2011-05-02 LAB — CBC
HCT: 36.3 % (ref 36.0–46.0)
Hemoglobin: 12.9 g/dL (ref 12.0–15.0)
MCH: 30.3 pg (ref 26.0–34.0)
MCHC: 35.5 g/dL (ref 30.0–36.0)
MCV: 85.2 fL (ref 78.0–100.0)
RBC: 4.26 MIL/uL (ref 3.87–5.11)

## 2011-05-02 MED ORDER — POTASSIUM CHLORIDE CRYS ER 20 MEQ PO TBCR
40.0000 meq | EXTENDED_RELEASE_TABLET | Freq: Two times a day (BID) | ORAL | Status: AC
  Administered 2011-05-02 (×2): 40 meq via ORAL
  Filled 2011-05-02 (×2): qty 2

## 2011-05-02 MED ORDER — INSULIN ASPART 100 UNIT/ML ~~LOC~~ SOLN
0.0000 [IU] | Freq: Three times a day (TID) | SUBCUTANEOUS | Status: DC
  Administered 2011-05-02: 5 [IU] via SUBCUTANEOUS
  Administered 2011-05-03: 2 [IU] via SUBCUTANEOUS

## 2011-05-02 MED ORDER — HYDROCODONE-ACETAMINOPHEN 5-325 MG PO TABS
1.0000 | ORAL_TABLET | Freq: Four times a day (QID) | ORAL | Status: DC | PRN
  Administered 2011-05-02 (×3): 1 via ORAL
  Filled 2011-05-02 (×4): qty 1

## 2011-05-02 MED ORDER — INSULIN ASPART PROT & ASPART (70-30 MIX) 100 UNIT/ML ~~LOC~~ SUSP: 15.0000 [IU] | Freq: Two times a day (BID) | SUBCUTANEOUS | Status: DC

## 2011-05-02 MED ORDER — IBUPROFEN 400 MG PO TABS: 600.0000 mg | ORAL_TABLET | Freq: Four times a day (QID) | ORAL | Status: DC | PRN

## 2011-05-02 MED ORDER — INSULIN ASPART PROT & ASPART (70-30 MIX) 100 UNIT/ML ~~LOC~~ SUSP
15.0000 [IU] | Freq: Two times a day (BID) | SUBCUTANEOUS | Status: DC
  Administered 2011-05-02 – 2011-05-03 (×2): 15 [IU] via SUBCUTANEOUS
  Filled 2011-05-02 (×2): qty 3

## 2011-05-02 MED ORDER — SODIUM CHLORIDE 0.9 % IJ SOLN
INTRAMUSCULAR | Status: AC
  Filled 2011-05-02: qty 20

## 2011-05-02 NOTE — Progress Notes (Signed)
Report given to T. Amie Critchley, Charity fundraiser. Pt alert, oriented and in stable condition at the time of transfer. Pt being transferred in a wheelchair via a NT to room 319.

## 2011-05-02 NOTE — Progress Notes (Addendum)
Inpatient Diabetes Program Recommendations  AACE/ADA: New Consensus Statement on Inpatient Glycemic Control (2009)  Target Ranges:  Prepandial:   less than 140 mg/dL      Peak postprandial:   less than 180 mg/dL (1-2 hours)      Critically ill patients:  140 - 180 mg/dL   Reason for Visit: Consult  Inpatient Diabetes Program Recommendations Insulin - Basal: Increase towards equivalent of Lantus/Novolog home dose:  Consider  Novolog 70/30  37 units BID  Note:   Lantus 40  Units qhs and Novolog 10 units TID  =  Total of 73 units of Novolog 70/30 insulin daily which is 36.5 units  BID.

## 2011-05-02 NOTE — Consult Note (Signed)
CSW received referral for Medicaid.  CM to follow and make referrals for medication and to financial counselor.  CSW to sign off.  [name]

## 2011-05-02 NOTE — Progress Notes (Signed)
PROGRESS NOTE  [name] EAV:[id number] DOB: [birth year] DOA: 05/01/2011  Subjective: Today the patient feels much better. Nausea and vomiting have resolved. No shortness of breath. Still has some pain in her left shoulder and the left side of the chest wall. Eating breakfast this morning.  Objective: Patient Vitals for the past 24 hrs:  BP Temp Temp src Pulse Resp SpO2 Height Weight  05/02/11 0800 - - - 75  18  98 % - -  05/02/11 0700 - - - 63  15  98 % - -  05/02/11 0600 - - - 69  19  97 % - -  05/02/11 0515 - - - 58  17  98 % - 49.8 kg (109 lb 12.6 oz)  05/02/11 0500 - - - 61  17  99 % - -  05/02/11 0400 94/54 mmHg 97.3 F (36.3 C) Oral 66  15  98 % - -  05/02/11 0300 91/64 mmHg - - 68  16  98 % - -  05/02/11 0200 84/47 mmHg - - 63  14  98 % - -  05/02/11 0100 95/55 mmHg - - 64  14  97 % - -  05/02/11 0000 95/62 mmHg 98 F (36.7 C) - 70  14  98 % - -  05/01/11 2300 94/54 mmHg - - 70  20  99 % - -  05/01/11 2200 - - - 67  17  97 % - -  05/01/11 2100 100/57 mmHg - - 71  16  97 % - -  05/01/11 2000 104/61 mmHg 98.2 F (36.8 C) Oral 74  20  98 % - -  05/01/11 1900 106/57 mmHg - - 76  21  98 % - -  05/01/11 1818 - - - 76  19  98 % - -  05/01/11 1800 105/59 mmHg - - 76  20  99 % - -  05/01/11 1720 - - - - - - 5' (1.524 m) 47 kg (103 lb 9.9 oz)  05/01/11 1700 107/67 mmHg - - 91  19  90 % - -  05/01/11 1638 100/76 mmHg 98.1 F (36.7 C) Oral 78  18  100 % - -  05/01/11 1500 - - - 75  21  100 % - -  05/01/11 1433 113/52 mmHg 97.8 F (36.6 C) Oral 96  - 100 % - -  05/01/11 1147 135/90 mmHg 97.7 F (36.5 C) Oral 129  20  100 % 5' (1.524 m) 49.896 kg (110 lb)     Intake/Output Summary (Last 24 hours) at 05/02/11 0825 Last data filed at 05/02/11 0800  Gross per 24 hour  Intake 2949.01 ml  Output      0 ml  Net 2949.01 ml    Exam: Gen.: Well-appearing, no acute distress. Appears calm and comfortable. Cardiovascular: Regular rate and rhythm. No murmur, rub, gallop. No lower  extremity edema. Respiratory: Clear to ossification bilaterally. No wheezes, Rales, rhonchi. Normal respiratory effort. Musculoskeletal: Tenderness superficially in the left axilla as well as along the left mid axillary line over the ribs.   Assessment/Plan: Assessment/Plan  #1: DKA. Clinically resolved. Secondary to noncompliance with insulin. #2: Uncontrolled diabetes mellitus type 1. She cannot afford Lantus right now. Will place her on 70/30 insulin this evening. Inpatient diabetes RN coordinator consult. Continue sliding scale insulin. #3: Atypical chest pain. Cardiac enzymes negative. Has some musculoskeletal pain. No evidence of acute coronary syndrome. #4: Leukocytosis. Resolved.  #5: Noncompliance. Will consult case  management social work. Hopefully this patient will be able to obtain Medicaid again.  #3: Pseudohyponatremia. Nearly resolved. Treat underlying condition.  Code Status: Full  Disposition Plan: Home when improved.    PCP: Currently none.   LOS: 1 day   Kelyn Ponciano PAUL   Data Reviewed: Results for orders placed during the hospital encounter of 05/01/11 (from the past 24 hour(s))  GLUCOSE, CAPILLARY     Status: Abnormal   Collection Time   05/01/11 11:54 AM      Component Value Range   Glucose-Capillary >600 (*) 70 - 99 (mg/dL)  CBC     Status: Abnormal   Collection Time   05/01/11  1:18 PM      Component Value Range   WBC 14.4 (*) 4.0 - 10.5 (K/uL)   RBC 5.16 (*) 3.87 - 5.11 (MIL/uL)   Hemoglobin 15.6 (*) 12.0 - 15.0 (g/dL)   HCT 54.0  98.1 - 19.1 (%)   MCV 87.4  78.0 - 100.0 (fL)   MCH 30.2  26.0 - 34.0 (pg)   MCHC 34.6  30.0 - 36.0 (g/dL)   RDW 47.8  29.5 - 62.1 (%)   Platelets 237  150 - 400 (K/uL)  DIFFERENTIAL     Status: Abnormal   Collection Time   05/01/11  1:18 PM      Component Value Range   Neutrophils Relative 86 (*) 43 - 77 (%)   Neutro Abs 12.3 (*) 1.7 - 7.7 (K/uL)   Lymphocytes Relative 8 (*) 12 - 46 (%)   Lymphs Abs 1.2  0.7 -  4.0 (K/uL)   Monocytes Relative 6  3 - 12 (%)   Monocytes Absolute 0.9  0.1 - 1.0 (K/uL)   Eosinophils Relative 0  0 - 5 (%)   Eosinophils Absolute 0.0  0.0 - 0.7 (K/uL)   Basophils Relative 0  0 - 1 (%)   Basophils Absolute 0.0  0.0 - 0.1 (K/uL)  BASIC METABOLIC PANEL     Status: Abnormal   Collection Time   05/01/11  1:18 PM      Component Value Range   Sodium 126 (*) 135 - 145 (mEq/L)   Potassium 4.7  3.5 - 5.1 (mEq/L)   Chloride 86 (*) 96 - 112 (mEq/L)   CO2 9 (*) 19 - 32 (mEq/L)   Glucose, Bld 577 (*) 70 - 99 (mg/dL)   BUN 13  6 - 23 (mg/dL)   Creatinine, Ser 3.08  0.50 - 1.10 (mg/dL)   Calcium 9.6  8.4 - 65.7 (mg/dL)   GFR calc non Af Amer >60  >60 (mL/min)   GFR calc Af Amer >60  >60 (mL/min)  TROPONIN I     Status: Normal   Collection Time   05/01/11  1:18 PM      Component Value Range   Troponin I <0.30  <0.30 (ng/mL)  URINALYSIS, ROUTINE W REFLEX MICROSCOPIC     Status: Abnormal   Collection Time   05/01/11  1:23 PM      Component Value Range   Color, Urine YELLOW  YELLOW    Appearance CLEAR  CLEAR    Specific Gravity, Urine >1.030 (*) 1.005 - 1.030    pH 5.0  5.0 - 8.0    Glucose, UA >1000 (*) NEGATIVE (mg/dL)   Hgb urine dipstick NEGATIVE  NEGATIVE    Bilirubin Urine NEGATIVE  NEGATIVE    Ketones, ur >80 (*) NEGATIVE (mg/dL)   Protein, ur NEGATIVE  NEGATIVE (mg/dL)  Urobilinogen, UA 0.2  0.0 - 1.0 (mg/dL)   Nitrite NEGATIVE  NEGATIVE    Leukocytes, UA NEGATIVE  NEGATIVE   URINE MICROSCOPIC-ADD ON     Status: Abnormal   Collection Time   05/01/11  1:23 PM      Component Value Range   Squamous Epithelial / LPF FEW (*) RARE    WBC, UA 0-2  <3 (WBC/hpf)   Bacteria, UA FEW (*) RARE    Urine-Other FEW YEAST    POCT PREGNANCY, URINE     Status: Normal   Collection Time   05/01/11  1:52 PM      Component Value Range   Preg Test, Ur NEGATIVE    GLUCOSE, CAPILLARY     Status: Abnormal   Collection Time   05/01/11  2:29 PM      Component Value Range    Glucose-Capillary 471 (*) 70 - 99 (mg/dL)  BLOOD GAS, ARTERIAL     Status: Abnormal   Collection Time   05/01/11  2:40 PM      Component Value Range   FIO2 .21     Delivery systems ROOM AIR     pH, Arterial 7.192 (*) 7.350 - 7.400    pCO2 19.1 (*) 35.0 - 45.0 (mmHg)   pO2, Arterial 116.0 (*) 80.0 - 100.0 (mmHg)   Bicarbonate 7.1 (*) 20.0 - 24.0 (mEq/L)   TCO2 6.5  0 - 100 (mmol/L)   Acid-base deficit 19.7 (*) 0.0 - 2.0 (mmol/L)   O2 Saturation 96.7     Patient temperature 37.0     Collection site LEFT BRACHIAL     Drawn by COLLECTED BY RT     Sample type ARTERIAL     Allens test (pass/fail) PASS  PASS   GLUCOSE, CAPILLARY     Status: Abnormal   Collection Time   05/01/11  3:34 PM      Component Value Range   Glucose-Capillary 319 (*) 70 - 99 (mg/dL)  GLUCOSE, CAPILLARY     Status: Abnormal   Collection Time   05/01/11  4:42 PM      Component Value Range   Glucose-Capillary 261 (*) 70 - 99 (mg/dL)  GLUCOSE, CAPILLARY     Status: Abnormal   Collection Time   05/01/11  5:15 PM      Component Value Range   Glucose-Capillary 228 (*) 70 - 99 (mg/dL)   Comment 1 Notify RN     Comment 2 Documented in Chart    CARDIAC PANEL(CRET KIN+CKTOT+MB+TROPI)     Status: Normal   Collection Time   05/01/11  5:40 PM      Component Value Range   Total CK 50  7 - 177 (U/L)   CK, MB 1.8  0.3 - 4.0 (ng/mL)   Troponin I <0.30  <0.30 (ng/mL)   Relative Index RELATIVE INDEX IS INVALID  0.0 - 2.5   BASIC METABOLIC PANEL     Status: Abnormal   Collection Time   05/01/11  5:40 PM      Component Value Range   Sodium 130 (*) 135 - 145 (mEq/L)   Potassium 3.7  3.5 - 5.1 (mEq/L)   Chloride 99  96 - 112 (mEq/L)   CO2 15 (*) 19 - 32 (mEq/L)   Glucose, Bld 224 (*) 70 - 99 (mg/dL)   BUN 10  6 - 23 (mg/dL)   Creatinine, Ser 6.96  0.50 - 1.10 (mg/dL)   Calcium 8.4  8.4 - 29.5 (mg/dL)  GFR calc non Af Amer >60  >60 (mL/min)   GFR calc Af Amer >60  >60 (mL/min)  MRSA PCR SCREENING     Status: Abnormal    Collection Time   05/01/11  5:47 PM      Component Value Range   MRSA by PCR POSITIVE (*) NEGATIVE   GLUCOSE, CAPILLARY     Status: Abnormal   Collection Time   05/01/11  6:03 PM      Component Value Range   Glucose-Capillary 221 (*) 70 - 99 (mg/dL)   Comment 1 Notify RN     Comment 2 Documented in Chart    GLUCOSE, CAPILLARY     Status: Abnormal   Collection Time   05/01/11  7:08 PM      Component Value Range   Glucose-Capillary 203 (*) 70 - 99 (mg/dL)   Comment 1 Notify RN    BASIC METABOLIC PANEL     Status: Abnormal   Collection Time   05/01/11  7:59 PM      Component Value Range   Sodium 130 (*) 135 - 145 (mEq/L)   Potassium 3.5  3.5 - 5.1 (mEq/L)   Chloride 101  96 - 112 (mEq/L)   CO2 20  19 - 32 (mEq/L)   Glucose, Bld 201 (*) 70 - 99 (mg/dL)   BUN 10  6 - 23 (mg/dL)   Creatinine, Ser 9.60  0.50 - 1.10 (mg/dL)   Calcium 8.3 (*) 8.4 - 10.5 (mg/dL)   GFR calc non Af Amer >60  >60 (mL/min)   GFR calc Af Amer >60  >60 (mL/min)  GLUCOSE, CAPILLARY     Status: Abnormal   Collection Time   05/01/11  8:08 PM      Component Value Range   Glucose-Capillary 223 (*) 70 - 99 (mg/dL)   Comment 1 Notify RN    GLUCOSE, CAPILLARY     Status: Abnormal   Collection Time   05/01/11  9:05 PM      Component Value Range   Glucose-Capillary 222 (*) 70 - 99 (mg/dL)   Comment 1 Notify RN    GLUCOSE, CAPILLARY     Status: Abnormal   Collection Time   05/01/11 10:04 PM      Component Value Range   Glucose-Capillary 203 (*) 70 - 99 (mg/dL)   Comment 1 Notify RN    GLUCOSE, CAPILLARY     Status: Abnormal   Collection Time   05/01/11 11:26 PM      Component Value Range   Glucose-Capillary 162 (*) 70 - 99 (mg/dL)   Comment 1 Notify RN     Comment 2 Documented in Chart    CARDIAC PANEL(CRET KIN+CKTOT+MB+TROPI)     Status: Normal   Collection Time   05/01/11 11:44 PM      Component Value Range   Total CK 56  7 - 177 (U/L)   CK, MB 1.9  0.3 - 4.0 (ng/mL)   Troponin I <0.30  <0.30 (ng/mL)    Relative Index RELATIVE INDEX IS INVALID  0.0 - 2.5   BASIC METABOLIC PANEL     Status: Abnormal   Collection Time   05/01/11 11:44 PM      Component Value Range   Sodium 129 (*) 135 - 145 (mEq/L)   Potassium 3.2 (*) 3.5 - 5.1 (mEq/L)   Chloride 102  96 - 112 (mEq/L)   CO2 21  19 - 32 (mEq/L)   Glucose, Bld 122 (*) 70 -  99 (mg/dL)   BUN 8  6 - 23 (mg/dL)   Creatinine, Ser 0.45 (*) 0.50 - 1.10 (mg/dL)   Calcium 8.1 (*) 8.4 - 10.5 (mg/dL)   GFR calc non Af Amer >60  >60 (mL/min)   GFR calc Af Amer >60  >60 (mL/min)  GLUCOSE, CAPILLARY     Status: Abnormal   Collection Time   05/02/11  4:15 AM      Component Value Range   Glucose-Capillary 121 (*) 70 - 99 (mg/dL)   Comment 1 Notify RN     Comment 2 Documented in Chart    BASIC METABOLIC PANEL     Status: Abnormal   Collection Time   05/02/11  4:27 AM      Component Value Range   Sodium 131 (*) 135 - 145 (mEq/L)   Potassium 3.4 (*) 3.5 - 5.1 (mEq/L)   Chloride 103  96 - 112 (mEq/L)   CO2 22  19 - 32 (mEq/L)   Glucose, Bld 103 (*) 70 - 99 (mg/dL)   BUN 8  6 - 23 (mg/dL)   Creatinine, Ser <4.09 (*) 0.50 - 1.10 (mg/dL)   Calcium 8.0 (*) 8.4 - 10.5 (mg/dL)   GFR calc non Af Amer NOT CALCULATED  >60 (mL/min)   GFR calc Af Amer NOT CALCULATED  >60 (mL/min)  CBC     Status: Normal   Collection Time   05/02/11  4:32 AM      Component Value Range   WBC 7.3  4.0 - 10.5 (K/uL)   RBC 4.26  3.87 - 5.11 (MIL/uL)   Hemoglobin 12.9  12.0 - 15.0 (g/dL)   HCT 81.1  91.4 - 78.2 (%)   MCV 85.2  78.0 - 100.0 (fL)   MCH 30.3  26.0 - 34.0 (pg)   MCHC 35.5  30.0 - 36.0 (g/dL)   RDW 95.6  21.3 - 08.6 (%)   Platelets 208  150 - 400 (K/uL)  GLUCOSE, CAPILLARY     Status: Abnormal   Collection Time   05/02/11  7:32 AM      Component Value Range   Glucose-Capillary 115 (*) 70 - 99 (mg/dL)    Recent Results (from the past 240 hour(s))  MRSA PCR SCREENING     Status: Abnormal   Collection Time   05/01/11  5:47 PM      Component Value Range Status  Comment   MRSA by PCR POSITIVE (*) NEGATIVE  Final      Studies/Results: Dg Chest 2 View  05/01/2011  *RADIOLOGY REPORT*  Clinical Data: Left upper anterior chest pain  CHEST - 2 VIEW  Comparison: Chest x-ray of 03/03/2011  Findings: The lungs are clear.  Mediastinal contours appear normal. The heart is within normal limits in size.  No bony abnormality is seen.  IMPRESSION: No active lung disease.  Original Report Authenticated By: Juline Patch, M.D.    Scheduled Meds:   . Chlorhexidine Gluconate Cloth  6 each Topical Q24H  . insulin aspart  0-15 Units Subcutaneous TID WC  . insulin aspart  0-5 Units Subcutaneous QHS  . insulin glargine  50 Units Subcutaneous Once  . mupirocin  1 application Nasal BID  . ondansetron  8 mg Oral Once  . oxyCODONE-acetaminophen  2 tablet Oral Once  . potassium chloride  10 mEq Oral BID  . potassium chloride  10 mEq Intravenous Q1H  . sodium chloride  1,000 mL Intravenous Once  . sodium chloride      .  DISCONTD: insulin aspart  0-15 Units Subcutaneous Q4H  . DISCONTD: insulin aspart  0-15 Units Subcutaneous TID WC  . DISCONTD: insulin aspart  0-15 Units Subcutaneous TID WC & HS  . DISCONTD: insulin aspart  0-15 Units Subcutaneous TID WC  . DISCONTD: insulin aspart  0-5 Units Subcutaneous QHS  . DISCONTD: insulin aspart  0-5 Units Subcutaneous QHS  . DISCONTD: potassium chloride  10 mEq Intravenous Q1H   Continuous Infusions:   . DISCONTD: sodium chloride    . DISCONTD: sodium chloride 125 mL/hr at 05/02/11 0800  . DISCONTD: sodium chloride Stopped (05/01/11 1730)  . DISCONTD: dextrose 5 % and 0.45% NaCl Stopped (05/02/11 0100)  . DISCONTD: dextrose 5 % and 0.45% NaCl 125 mL/hr at 05/01/11 1731  . DISCONTD: insulin (NOVOLIN-R) infusion 4 Units/hr (05/01/11 1644)  . DISCONTD: insulin (NOVOLIN-R) infusion    . DISCONTD: insulin (NOVOLIN-R) infusion Stopped (05/01/11 2340)

## 2011-05-03 LAB — GLUCOSE, CAPILLARY
Glucose-Capillary: 63 mg/dL — ABNORMAL LOW (ref 70–99)
Glucose-Capillary: 143 mg/dL — ABNORMAL HIGH (ref 70–99)
Glucose-Capillary: 129 mg/dL — ABNORMAL HIGH (ref 70–99)

## 2011-05-03 MED ORDER — "INSULIN SYRINGE 31G X 5/16"" 1 ML MISC": Status: DC

## 2011-05-03 MED ORDER — INSULIN PEN NEEDLE 31G X 5 MM MISC: Status: DC

## 2011-05-03 MED ORDER — INSULIN ASPART PROT & ASPART (70-30 MIX) 100 UNIT/ML ~~LOC~~ SUSP: 15.0000 [IU] | Freq: Two times a day (BID) | SUBCUTANEOUS | Status: DC

## 2011-05-03 MED ORDER — MUPIROCIN 2 % EX OINT: 1.0000 "application " | TOPICAL_OINTMENT | Freq: Two times a day (BID) | CUTANEOUS | Status: AC

## 2011-05-03 MED ORDER — INSULIN ASPART 100 UNIT/ML ~~LOC~~ SOLN: SUBCUTANEOUS | Status: DC

## 2011-05-03 NOTE — Progress Notes (Signed)
PROGRESS NOTE  [name] ZOX:[id number] DOB: [birth year] DOA: 05/01/2011  Subjective: Continues to feel better. Shoulder pain and chest pain has resolved.  Objective: Patient Vitals for the past 24 hrs:  BP Temp Temp src Pulse Resp SpO2  05/02/11 2143 113/79 mmHg 97.8 F (36.6 C) - 79  20  96 %  05/02/11 1454 95/60 mmHg 98.3 F (36.8 C) Oral 65  20  98 %  05/02/11 1039 94/59 mmHg 97.8 F (36.6 C) - 60  18  99 %     Intake/Output Summary (Last 24 hours) at 05/03/11 0826 Last data filed at 05/02/11 1759  Gross per 24 hour  Intake 540.42 ml  Output      0 ml  Net 540.42 ml    Exam: Gen.: Well-appearing, no acute distress. Appears calm and comfortable. Cardiovascular: Regular rate and rhythm. No murmur, rub, gallop. No lower extremity edema. Respiratory: Clear to auscultation bilaterally. No wheezes, Rales, rhonchi. Normal respiratory effort.   Assessment/Plan: Assessment/Plan  #1: DKA. Clinically resolved. Secondary to noncompliance with insulin. #2: Uncontrolled diabetes mellitus type 1. She cannot afford Lantus right now. Continue 70/30 insulin this evening. Continue sliding scale insulin. One episode of hypoglycemia this morning which the patient did feel. We will followup on her blood sugar at lunch. #3: Atypical chest pain. Cardiac enzymes negative. Resolved. Musculoskeletal in nature. #4: Leukocytosis. Resolved.  #5: Noncompliance. Case management assisted with outpatient followup and Medicaid application  Code Status: Full  Disposition Plan: Home later today.    PCP: Currently none.   LOS: 2 days   GOODRICH,DANIEL PAUL   Data Reviewed: Results for orders placed during the hospital encounter of 05/01/11 (from the past 24 hour(s))  GLUCOSE, CAPILLARY     Status: Normal   Collection Time   05/02/11 12:10 PM      Component Value Range   Glucose-Capillary 84  70 - 99 (mg/dL)  GLUCOSE, CAPILLARY     Status: Abnormal   Collection Time   05/02/11  3:58 PM     Component Value Range   Glucose-Capillary 245 (*) 70 - 99 (mg/dL)   Comment 1 Notify RN     Comment 2 Documented in Chart    GLUCOSE, CAPILLARY     Status: Abnormal   Collection Time   05/02/11  8:21 PM      Component Value Range   Glucose-Capillary 179 (*) 70 - 99 (mg/dL)   Comment 1 Notify RN    GLUCOSE, CAPILLARY     Status: Abnormal   Collection Time   05/02/11  9:32 PM      Component Value Range   Glucose-Capillary 164 (*) 70 - 99 (mg/dL)   Comment 1 Notify RN    GLUCOSE, CAPILLARY     Status: Abnormal   Collection Time   05/03/11  3:19 AM      Component Value Range   Glucose-Capillary 63 (*) 70 - 99 (mg/dL)  GLUCOSE, CAPILLARY     Status: Abnormal   Collection Time   05/03/11  3:48 AM      Component Value Range   Glucose-Capillary 143 (*) 70 - 99 (mg/dL)  GLUCOSE, CAPILLARY     Status: Abnormal   Collection Time   05/03/11  7:31 AM      Component Value Range   Glucose-Capillary 129 (*) 70 - 99 (mg/dL)   Comment 1 Notify RN      Recent Results (from the past 240 hour(s))  MRSA PCR SCREENING  Status: Abnormal   Collection Time   05/01/11  5:47 PM      Component Value Range Status Comment   MRSA by PCR POSITIVE (*) NEGATIVE  Final      Studies/Results: Dg Chest 2 View  05/01/2011  *RADIOLOGY REPORT*  Clinical Data: Left upper anterior chest pain  CHEST - 2 VIEW  Comparison: Chest x-ray of 03/03/2011  Findings: The lungs are clear.  Mediastinal contours appear normal. The heart is within normal limits in size.  No bony abnormality is seen.  IMPRESSION: No active lung disease.  Original Report Authenticated By: Juline Patch, M.D.    Scheduled Meds:    . Chlorhexidine Gluconate Cloth  6 each Topical Q24H  . insulin aspart  0-15 Units Subcutaneous TID WC  . insulin aspart  0-5 Units Subcutaneous QHS  . insulin aspart protamine-insulin aspart  15 Units Subcutaneous BID WC  . mupirocin  1 application Nasal BID  . potassium chloride  40 mEq Oral BID  . sodium  chloride      . DISCONTD: insulin aspart  0-15 Units Subcutaneous TID WC  . DISCONTD: insulin aspart protamine-insulin aspart  15 Units Subcutaneous BID WC  . DISCONTD: potassium chloride  10 mEq Oral BID   Continuous Infusions:

## 2011-05-03 NOTE — Discharge Summary (Signed)
Physician Discharge Summary  [name] AOZ:[id number] DOB: 1986/01/18 DOA: 05/01/2011  PCP: Daisy Lazar, PA, PA  Admit date: 05/01/2011 Discharge date: 05/03/2011  Discharge Diagnoses:  #1: DKA. #2: Uncontrolled diabetes mellitus type 1.  #3: Atypical chest pain. Resolved. #4: Leukocytosis. Resolved.  #5: Noncompliance.  #6: Smoker.  Discharge Condition: Improved.  Disposition: Home.  Hospital Course:  This is a 25 year old woman who present to the emergency room with atypical chest pain. Her chest pain workup was unremarkable in the emergency room however basic investigatory studies revealed DKA. The patient did admit to being out of her Lantus for approximately 4 days. She cannot afford this medication. She had also been using her sliding scale insulin only once a day to conserve it. She was admitted for further evaluation and treatment.  The patient was admitted to the step down unit for IV insulin infusion per DKA protocol. Her condition rapidly improved and the following day she was transferred to the medical floor. Her blood sugars now under control and her laboratory abnormalities have resolved. Her atypical chest pain has resolved. There was no evidence of acute coronary syndrome.  The patient was started on 70/30 insulin mix. Her blood sugars are well-controlled on her current dosing. She will also resume sliding scale insulin as an outpatient. Case management has set her up with an appointment with the [facility] Department. The patient was counseled to discontinue smoking.  Discharge Exam: Filed Vitals:   05/02/11 2143  BP: 113/79  Pulse: 79  Temp: 97.8 F (36.6 C)  Resp: 20   As noted in today's progress note.  Disposition: Home or Self Care  Discharge Orders    Future Orders Please Complete By Expires   Diet Carb Modified      Discharge instructions      Comments:   Followup with her primary care physician within 2-3 weeks.   Activity  as tolerated - No restrictions        Current Discharge Medication List    START taking these medications   Details  insulin aspart protamine-insulin aspart (NOVOLOG 70/30) (70-30) 100 UNIT/ML injection Inject 15 Units into the skin 2 (two) times daily with a meal. Qty: 10 mL, Refills: 0    Insulin Syringe-Needle U-100 (INSULIN SYRINGE 1CC/31GX5/16") 31G X 5/16" 1 ML MISC Use as directed. Qty: 100 each, Refills: 0    mupirocin (BACTROBAN) 2 % ointment Apply 1 application topically 2 (two) times daily. Send tube home with patient. Use for an additional 5 days. Qty: 22 g      CONTINUE these medications which have CHANGED   Details  insulin aspart (NOVOLOG) 100 UNIT/ML injection Resume your home sliding scale dosing. Use sliding scale with meals 3 times a day. Qty: 10 mL, Refills: 0      CONTINUE these medications which have NOT CHANGED   Details  ibuprofen (ADVIL,MOTRIN) 200 MG tablet Take 400 mg by mouth 2 (two) times daily. For pain       STOP taking these medications     insulin glargine (LANTUS) 100 UNIT/ML injection        Follow-up Information    Follow up with [facility] Department. (July 20,2012 @ 3:10 pm)    Contact information:   [address] [place] [address] [phone]          Things to follow up in the outpatient setting: Continue to recommend smoking cessation.  Time coordinating discharge: 25 minutes.  The results of significant  diagnostics from this hospitalization (including imaging, microbiology, ancillary and laboratory) are listed below for reference.    Significant Diagnostic Studies: Dg Chest 2 View 05/01/2011  IMPRESSION: No active lung disease.     Microbiology: Recent Results (from the past 240 hour(s))  MRSA PCR SCREENING     Status: Abnormal   Collection Time   05/01/11  5:47 PM      Component Value Range Status Comment   MRSA by PCR POSITIVE (*) NEGATIVE  Final      Labs: Results for orders placed during  the hospital encounter of 05/01/11 (from the past 48 hour(s))  CBC     Status: Abnormal   Collection Time   05/01/11  1:18 PM      Component Value Range Comment   WBC 14.4 (*) 4.0 - 10.5 (K/uL)    RBC 5.16 (*) 3.87 - 5.11 (MIL/uL)    Hemoglobin 15.6 (*) 12.0 - 15.0 (g/dL)    HCT 16.1  09.6 - 04.5 (%)    MCV 87.4  78.0 - 100.0 (fL)    MCH 30.2  26.0 - 34.0 (pg)    MCHC 34.6  30.0 - 36.0 (g/dL)    RDW 40.9  81.1 - 91.4 (%)    Platelets 237  150 - 400 (K/uL)   DIFFERENTIAL     Status: Abnormal   Collection Time   05/01/11  1:18 PM      Component Value Range Comment   Neutrophils Relative 86 (*) 43 - 77 (%)    Neutro Abs 12.3 (*) 1.7 - 7.7 (K/uL)    Lymphocytes Relative 8 (*) 12 - 46 (%)    Lymphs Abs 1.2  0.7 - 4.0 (K/uL)    Monocytes Relative 6  3 - 12 (%)    Monocytes Absolute 0.9  0.1 - 1.0 (K/uL)    Eosinophils Relative 0  0 - 5 (%)    Eosinophils Absolute 0.0  0.0 - 0.7 (K/uL)    Basophils Relative 0  0 - 1 (%)    Basophils Absolute 0.0  0.0 - 0.1 (K/uL)   BASIC METABOLIC PANEL     Status: Abnormal   Collection Time   05/01/11  1:18 PM      Component Value Range Comment   Sodium 126 (*) 135 - 145 (mEq/L)    Potassium 4.7  3.5 - 5.1 (mEq/L)    Chloride 86 (*) 96 - 112 (mEq/L)    CO2 9 (*) 19 - 32 (mEq/L)    Glucose, Bld 577 (*) 70 - 99 (mg/dL)    BUN 13  6 - 23 (mg/dL)    Creatinine, Ser 7.82  0.50 - 1.10 (mg/dL) **Please note change in reference range.**   Calcium 9.6  8.4 - 10.5 (mg/dL)    GFR calc non Af Amer >60  >60 (mL/min)    GFR calc Af Amer >60  >60 (mL/min)   TROPONIN I     Status: Normal   Collection Time   05/01/11  1:18 PM      Component Value Range Comment   Troponin I <0.30  <0.30 (ng/mL)   URINALYSIS, ROUTINE W REFLEX MICROSCOPIC     Status: Abnormal   Collection Time   05/01/11  1:23 PM      Component Value Range Comment   Color, Urine YELLOW  YELLOW     Appearance CLEAR  CLEAR     Specific Gravity, Urine >1.030 (*) 1.005 - 1.030     pH 5.0  5.0 -  8.0     Glucose, UA >1000 (*) NEGATIVE (mg/dL)    Hgb urine dipstick NEGATIVE  NEGATIVE     Bilirubin Urine NEGATIVE  NEGATIVE     Ketones, ur >80 (*) NEGATIVE (mg/dL)    Protein, ur NEGATIVE  NEGATIVE (mg/dL)    Urobilinogen, UA 0.2  0.0 - 1.0 (mg/dL)    Nitrite NEGATIVE  NEGATIVE     Leukocytes, UA NEGATIVE  NEGATIVE    URINE MICROSCOPIC-ADD ON     Status: Abnormal   Collection Time   05/01/11  1:23 PM      Component Value Range Comment   Squamous Epithelial / LPF FEW (*) RARE     WBC, UA 0-2  <3 (WBC/hpf)    Bacteria, UA FEW (*) RARE     Urine-Other FEW YEAST   TRICHOMONAS PRESENT  POCT PREGNANCY, URINE     Status: Normal   Collection Time   05/01/11  1:52 PM      Component Value Range Comment   Preg Test, Ur NEGATIVE     GLUCOSE, CAPILLARY     Status: Abnormal   Collection Time   05/01/11  2:29 PM      Component Value Range Comment   Glucose-Capillary 471 (*) 70 - 99 (mg/dL)   BLOOD GAS, ARTERIAL     Status: Abnormal   Collection Time   05/01/11  2:40 PM      Component Value Range Comment   FIO2 .21      Delivery systems ROOM AIR      pH, Arterial 7.192 (*) 7.350 - 7.400     pCO2 19.1 (*) 35.0 - 45.0 (mmHg)    pO2, Arterial 116.0 (*) 80.0 - 100.0 (mmHg)    Bicarbonate 7.1 (*) 20.0 - 24.0 (mEq/L)    TCO2 6.5  0 - 100 (mmol/L)    Acid-base deficit 19.7 (*) 0.0 - 2.0 (mmol/L)    O2 Saturation 96.7      Patient temperature 37.0      Collection site LEFT BRACHIAL      Drawn by COLLECTED BY RT      Sample type ARTERIAL      Allens test (pass/fail) PASS  PASS    GLUCOSE, CAPILLARY     Status: Abnormal   Collection Time   05/01/11  3:34 PM      Component Value Range Comment   Glucose-Capillary 319 (*) 70 - 99 (mg/dL)   GLUCOSE, CAPILLARY     Status: Abnormal   Collection Time   05/01/11  4:42 PM      Component Value Range Comment   Glucose-Capillary 261 (*) 70 - 99 (mg/dL)   GLUCOSE, CAPILLARY     Status: Abnormal   Collection Time   05/01/11  5:15 PM      Component  Value Range Comment   Glucose-Capillary 228 (*) 70 - 99 (mg/dL)    Comment 1 Notify RN      Comment 2 Documented in Chart     CARDIAC PANEL(CRET KIN+CKTOT+MB+TROPI)     Status: Normal   Collection Time   05/01/11  5:40 PM      Component Value Range Comment   Total CK 50  7 - 177 (U/L)    CK, MB 1.8  0.3 - 4.0 (ng/mL)    Troponin I <0.30  <0.30 (ng/mL)    Relative Index RELATIVE INDEX IS INVALID  0.0 - 2.5    BASIC METABOLIC PANEL  Status: Abnormal   Collection Time   05/01/11  5:40 PM      Component Value Range Comment   Sodium 130 (*) 135 - 145 (mEq/L)    Potassium 3.7  3.5 - 5.1 (mEq/L) DELTA CHECK NOTED   Chloride 99  96 - 112 (mEq/L) DELTA CHECK NOTED   CO2 15 (*) 19 - 32 (mEq/L)    Glucose, Bld 224 (*) 70 - 99 (mg/dL)    BUN 10  6 - 23 (mg/dL)    Creatinine, Ser 1.19  0.50 - 1.10 (mg/dL) **Please note change in reference range.**   Calcium 8.4  8.4 - 10.5 (mg/dL)    GFR calc non Af Amer >60  >60 (mL/min)    GFR calc Af Amer >60  >60 (mL/min)   MRSA PCR SCREENING     Status: Abnormal   Collection Time   05/01/11  5:47 PM      Component Value Range Comment   MRSA by PCR POSITIVE (*) NEGATIVE    GLUCOSE, CAPILLARY     Status: Abnormal   Collection Time   05/01/11  6:03 PM      Component Value Range Comment   Glucose-Capillary 221 (*) 70 - 99 (mg/dL)    Comment 1 Notify RN      Comment 2 Documented in Chart     GLUCOSE, CAPILLARY     Status: Abnormal   Collection Time   05/01/11  7:08 PM      Component Value Range Comment   Glucose-Capillary 203 (*) 70 - 99 (mg/dL)    Comment 1 Notify RN     BASIC METABOLIC PANEL     Status: Abnormal   Collection Time   05/01/11  7:59 PM      Component Value Range Comment   Sodium 130 (*) 135 - 145 (mEq/L)    Potassium 3.5  3.5 - 5.1 (mEq/L)    Chloride 101  96 - 112 (mEq/L)    CO2 20  19 - 32 (mEq/L)    Glucose, Bld 201 (*) 70 - 99 (mg/dL)    BUN 10  6 - 23 (mg/dL)    Creatinine, Ser 1.47  0.50 - 1.10 (mg/dL) **Please note change  in reference range.**   Calcium 8.3 (*) 8.4 - 10.5 (mg/dL)    GFR calc non Af Amer >60  >60 (mL/min)    GFR calc Af Amer >60  >60 (mL/min)   GLUCOSE, CAPILLARY     Status: Abnormal   Collection Time   05/01/11  8:08 PM      Component Value Range Comment   Glucose-Capillary 223 (*) 70 - 99 (mg/dL)    Comment 1 Notify RN     GLUCOSE, CAPILLARY     Status: Abnormal   Collection Time   05/01/11  9:05 PM      Component Value Range Comment   Glucose-Capillary 222 (*) 70 - 99 (mg/dL)    Comment 1 Notify RN     GLUCOSE, CAPILLARY     Status: Abnormal   Collection Time   05/01/11 10:04 PM      Component Value Range Comment   Glucose-Capillary 203 (*) 70 - 99 (mg/dL)    Comment 1 Notify RN     GLUCOSE, CAPILLARY     Status: Abnormal   Collection Time   05/01/11 11:26 PM      Component Value Range Comment   Glucose-Capillary 162 (*) 70 - 99 (mg/dL)    Comment 1 Notify  RN      Comment 2 Documented in Chart     CARDIAC PANEL(CRET KIN+CKTOT+MB+TROPI)     Status: Normal   Collection Time   05/01/11 11:44 PM      Component Value Range Comment   Total CK 56  7 - 177 (U/L)    CK, MB 1.9  0.3 - 4.0 (ng/mL)    Troponin I <0.30  <0.30 (ng/mL)    Relative Index RELATIVE INDEX IS INVALID  0.0 - 2.5    BASIC METABOLIC PANEL     Status: Abnormal   Collection Time   05/01/11 11:44 PM      Component Value Range Comment   Sodium 129 (*) 135 - 145 (mEq/L)    Potassium 3.2 (*) 3.5 - 5.1 (mEq/L)    Chloride 102  96 - 112 (mEq/L)    CO2 21  19 - 32 (mEq/L)    Glucose, Bld 122 (*) 70 - 99 (mg/dL)    BUN 8  6 - 23 (mg/dL)    Creatinine, Ser 1.61 (*) 0.50 - 1.10 (mg/dL) **Please note change in reference range.**   Calcium 8.1 (*) 8.4 - 10.5 (mg/dL)    GFR calc non Af Amer >60  >60 (mL/min)    GFR calc Af Amer >60  >60 (mL/min)   GLUCOSE, CAPILLARY     Status: Abnormal   Collection Time   05/02/11  4:15 AM      Component Value Range Comment   Glucose-Capillary 121 (*) 70 - 99 (mg/dL)    Comment 1 Notify  RN      Comment 2 Documented in Chart     BASIC METABOLIC PANEL     Status: Abnormal   Collection Time   05/02/11  4:27 AM      Component Value Range Comment   Sodium 131 (*) 135 - 145 (mEq/L)    Potassium 3.4 (*) 3.5 - 5.1 (mEq/L)    Chloride 103  96 - 112 (mEq/L)    CO2 22  19 - 32 (mEq/L)    Glucose, Bld 103 (*) 70 - 99 (mg/dL)    BUN 8  6 - 23 (mg/dL)    Creatinine, Ser <0.96 (*) 0.50 - 1.10 (mg/dL) **Please note change in reference range.**   Calcium 8.0 (*) 8.4 - 10.5 (mg/dL)    GFR calc non Af Amer NOT CALCULATED  >60 (mL/min)    GFR calc Af Amer NOT CALCULATED  >60 (mL/min)   CBC     Status: Normal   Collection Time   05/02/11  4:32 AM      Component Value Range Comment   WBC 7.3  4.0 - 10.5 (K/uL)    RBC 4.26  3.87 - 5.11 (MIL/uL)    Hemoglobin 12.9  12.0 - 15.0 (g/dL)    HCT 04.5  40.9 - 81.1 (%)    MCV 85.2  78.0 - 100.0 (fL)    MCH 30.3  26.0 - 34.0 (pg)    MCHC 35.5  30.0 - 36.0 (g/dL)    RDW 91.4  78.2 - 95.6 (%)    Platelets 208  150 - 400 (K/uL)   GLUCOSE, CAPILLARY     Status: Abnormal   Collection Time   05/02/11  7:32 AM      Component Value Range Comment   Glucose-Capillary 115 (*) 70 - 99 (mg/dL)   GLUCOSE, CAPILLARY     Status: Normal   Collection Time   05/02/11 12:10 PM  Component Value Range Comment   Glucose-Capillary 84  70 - 99 (mg/dL)   GLUCOSE, CAPILLARY     Status: Abnormal   Collection Time   05/02/11  3:58 PM      Component Value Range Comment   Glucose-Capillary 245 (*) 70 - 99 (mg/dL)    Comment 1 Notify RN      Comment 2 Documented in Chart     GLUCOSE, CAPILLARY     Status: Abnormal   Collection Time   05/02/11  8:21 PM      Component Value Range Comment   Glucose-Capillary 179 (*) 70 - 99 (mg/dL)    Comment 1 Notify RN     GLUCOSE, CAPILLARY     Status: Abnormal   Collection Time   05/02/11  9:32 PM      Component Value Range Comment   Glucose-Capillary 164 (*) 70 - 99 (mg/dL)    Comment 1 Notify RN     GLUCOSE, CAPILLARY      Status: Abnormal   Collection Time   05/03/11  3:19 AM      Component Value Range Comment   Glucose-Capillary 63 (*) 70 - 99 (mg/dL)   GLUCOSE, CAPILLARY     Status: Abnormal   Collection Time   05/03/11  3:48 AM      Component Value Range Comment   Glucose-Capillary 143 (*) 70 - 99 (mg/dL)   GLUCOSE, CAPILLARY     Status: Abnormal   Collection Time   05/03/11  7:31 AM      Component Value Range Comment   Glucose-Capillary 129 (*) 70 - 99 (mg/dL)    Comment 1 Notify RN     GLUCOSE, CAPILLARY     Status: Abnormal   Collection Time   05/03/11 11:35 AM      Component Value Range Comment   Glucose-Capillary 118 (*) 70 - 99 (mg/dL)    Comment 1 Notify RN        Signed: Pandora Leiter MD 05/03/2011, 12:54 PM

## 2011-05-03 NOTE — Progress Notes (Signed)
PATIENT WITH INSTRUCTIONS GIVEN ON DISCHARGE MEDICATIONS,ANF FOLLOW UP VISITS,PATIENT VERBALIZED  UNDERSTANDING.PRESCRIPTIONS TO BE PICKED UP FROM PHARMACY.ACCOMPANIED BY STAFF TO AN AWAITING  VECHICLE

## 2011-06-11 ENCOUNTER — Inpatient Hospital Stay (HOSPITAL_COMMUNITY)
Admission: EM | Admit: 2011-06-11 | Discharge: 2011-06-13 | DRG: 638 | Disposition: A | Discharge status: Home / Self Care | Payer: Medicaid Other | Attending: Internal Medicine | Admitting: Internal Medicine

## 2011-06-11 ENCOUNTER — Emergency Department (HOSPITAL_COMMUNITY): Payer: Medicaid Other

## 2011-06-11 DIAGNOSIS — E876 Hypokalemia: Secondary | ICD-10-CM | POA: Diagnosis present

## 2011-06-11 DIAGNOSIS — Z91199 Patient's noncompliance with other medical treatment and regimen due to unspecified reason: Secondary | ICD-10-CM

## 2011-06-11 DIAGNOSIS — E871 Hypo-osmolality and hyponatremia: Secondary | ICD-10-CM | POA: Diagnosis present

## 2011-06-11 DIAGNOSIS — Z794 Long term (current) use of insulin: Secondary | ICD-10-CM

## 2011-06-11 DIAGNOSIS — Z9119 Patient's noncompliance with other medical treatment and regimen: Secondary | ICD-10-CM

## 2011-06-11 DIAGNOSIS — N390 Urinary tract infection, site not specified: Secondary | ICD-10-CM | POA: Diagnosis present

## 2011-06-11 DIAGNOSIS — E1010 Type 1 diabetes mellitus with ketoacidosis without coma: Principal | ICD-10-CM | POA: Diagnosis present

## 2011-06-11 LAB — BLOOD GAS, ARTERIAL
Acid-base deficit: 20.9 mmol/L — ABNORMAL HIGH (ref 0.0–2.0)
Bicarbonate: 6.1 mEq/L — ABNORMAL LOW (ref 20.0–24.0)
O2 Saturation: 97.2 %
Patient temperature: 98.6
TCO2: 5.7 mmol/L (ref 0–100)
pO2, Arterial: 99.5 mmHg (ref 80.0–100.0)

## 2011-06-11 LAB — URINALYSIS, ROUTINE W REFLEX MICROSCOPIC
Glucose, UA: >1000 mg/dL — AB
Ketones, ur: >80 mg/dL — AB
Leukocytes, UA: NEGATIVE
pH: 5.5 (ref 5.0–8.0)

## 2011-06-11 LAB — GLUCOSE, CAPILLARY
Glucose-Capillary: 392 mg/dL — ABNORMAL HIGH (ref 70–99)
Glucose-Capillary: 389 mg/dL — ABNORMAL HIGH (ref 70–99)

## 2011-06-11 LAB — BASIC METABOLIC PANEL
BUN: 10 mg/dL (ref 6–23)
CO2: 8 mEq/L — CL (ref 19–32)
Chloride: 100 mEq/L (ref 96–112)
Creatinine, Ser: 0.77 mg/dL (ref 0.50–1.10)
Glucose, Bld: 486 mg/dL — ABNORMAL HIGH (ref 70–99)

## 2011-06-11 LAB — CBC
Hemoglobin: 15 g/dL (ref 12.0–15.0)
MCH: 30 pg (ref 26.0–34.0)
MCHC: 34.6 g/dL (ref 30.0–36.0)
MCV: 86.6 fL (ref 78.0–100.0)
RBC: 5 MIL/uL (ref 3.87–5.11)

## 2011-06-11 LAB — MRSA PCR SCREENING: MRSA by PCR: INVALID — AB

## 2011-06-11 LAB — URINE MICROSCOPIC-ADD ON

## 2011-06-12 LAB — BASIC METABOLIC PANEL
BUN: 3 mg/dL — ABNORMAL LOW (ref 6–23)
CO2: 18 mEq/L — ABNORMAL LOW (ref 19–32)
Calcium: 7.2 mg/dL — ABNORMAL LOW (ref 8.4–10.5)
Chloride: 109 mEq/L (ref 96–112)
Chloride: 103 mEq/L (ref 96–112)
Creatinine, Ser: 0.47 mg/dL — ABNORMAL LOW (ref 0.50–1.10)
Creatinine, Ser: <0.47 mg/dL — ABNORMAL LOW (ref 0.50–1.10)
GFR calc Af Amer: >60 mL/min (ref >60)
GFR calc Af Amer: >60 mL/min (ref >60)
GFR calc non Af Amer: >60 mL/min (ref >60)
GFR calc non Af Amer: >60 mL/min (ref >60)
Potassium: 2.7 mEq/L — CL (ref 3.5–5.1)
Sodium: 132 mEq/L — ABNORMAL LOW (ref 135–145)
Sodium: 136 mEq/L (ref 135–145)

## 2011-06-12 LAB — GLUCOSE, CAPILLARY
Glucose-Capillary: 113 mg/dL — ABNORMAL HIGH (ref 70–99)
Glucose-Capillary: 145 mg/dL — ABNORMAL HIGH (ref 70–99)
Glucose-Capillary: 158 mg/dL — ABNORMAL HIGH (ref 70–99)
Glucose-Capillary: 176 mg/dL — ABNORMAL HIGH (ref 70–99)
Glucose-Capillary: 201 mg/dL — ABNORMAL HIGH (ref 70–99)
Glucose-Capillary: 222 mg/dL — ABNORMAL HIGH (ref 70–99)
Glucose-Capillary: 239 mg/dL — ABNORMAL HIGH (ref 70–99)
Glucose-Capillary: 240 mg/dL — ABNORMAL HIGH (ref 70–99)
Glucose-Capillary: 243 mg/dL — ABNORMAL HIGH (ref 70–99)
Glucose-Capillary: 255 mg/dL — ABNORMAL HIGH (ref 70–99)
Glucose-Capillary: 78 mg/dL (ref 70–99)
Glucose-Capillary: 97 mg/dL (ref 70–99)

## 2011-06-12 LAB — HEMOGLOBIN A1C
Hgb A1c MFr Bld: 13.9 % — ABNORMAL HIGH (ref <5.7)
Mean Plasma Glucose: 352 mg/dL — ABNORMAL HIGH (ref <117)

## 2011-06-12 LAB — BLOOD GAS, ARTERIAL
Drawn by: 129801
Patient temperature: 98.6
TCO2: 16.1 mmol/L (ref 0–100)
pCO2 arterial: 32 mmHg — ABNORMAL LOW (ref 35.0–45.0)
pH, Arterial: 7.362 (ref 7.350–7.400)

## 2011-06-12 LAB — RAPID URINE DRUG SCREEN, HOSP PERFORMED
Amphetamines: NOT DETECTED
Tetrahydrocannabinol: NOT DETECTED

## 2011-06-12 LAB — URINE CULTURE
Colony Count: NO GROWTH
Culture  Setup Time: 201208252016

## 2011-06-12 LAB — MAGNESIUM: Magnesium: 1.7 mg/dL (ref 1.5–2.5)

## 2011-06-12 LAB — PHOSPHORUS: Phosphorus: 0.5 mg/dL — CL (ref 2.3–4.6)

## 2011-06-13 LAB — BASIC METABOLIC PANEL
CO2: 29 mEq/L (ref 19–32)
Chloride: 103 mEq/L (ref 96–112)
Creatinine, Ser: <0.47 mg/dL — ABNORMAL LOW (ref 0.50–1.10)
Potassium: 3.2 mEq/L — ABNORMAL LOW (ref 3.5–5.1)

## 2011-06-13 LAB — GLUCOSE, CAPILLARY: Glucose-Capillary: 152 mg/dL — ABNORMAL HIGH (ref 70–99)

## 2011-06-13 LAB — PHOSPHORUS: Phosphorus: 1.6 mg/dL — ABNORMAL LOW (ref 2.3–4.6)

## 2011-06-14 LAB — MRSA CULTURE

## 2011-06-16 NOTE — Progress Notes (Signed)
Encounter addended by: Clarene Critchley on: 06/16/2011  6:23 AM<BR>     Documentation filed: Flowsheet VN

## 2011-07-01 NOTE — Discharge Summary (Signed)
  [name], [name]           ACCOUNT NO.:  [id number]  MEDICAL RECORD NO.:  [id number]  LOCATION:  1411                         FACILITY:  [facility]  PHYSICIAN:  Richarda Overlie, MD       DATE OF BIRTH:  02-Aug-1986  DATE OF ADMISSION:  06/11/2011 DATE OF DISCHARGE:  06/13/2011                              DISCHARGE SUMMARY   PRIMARY CARE PHYSICIAN:  [facility] Department.  DISCHARGE DIAGNOSES: 1. Diabetic ketoacidosis. 2. Urinary tract infection. 3. Hyponatremia secondary to hypoglycemia. 4. Severe hyperphosphatemia. 5. Severe hypokalemia. 6. Uncontrolled type 1 diabetes. 7. Medical noncompliance.  SUBJECTIVE:  This is a 25 year old female who presents with nausea, vomiting, increased urinary frequency, and generalized weakness.  The patient ran out of her insulin 9 days ago and did not refill her prescriptions.  The patient was found to be in DKA upon presentation, she was also found to have a mild dry cough and significant metabolic acidosis with an elevated anion gap and ketonuria.  She was admitted for DKA to the ICU and was treated per DKA protocol.  She was started on an insulin drip and hydrated aggressively with IV fluids.  Her electrolytes were repleted.  She was found to be hypernatremic upon initial presentation with a sodium of 133, this was secondary to her hyperglycemia.  The patient was subsequently transitioned to Lantus and sliding scale insulin.  She was counseled about her diabetes and about medication compliance.  She was also provided with prescriptions for K- Dur as well as K-Phos because of her severe hyperphosphatemia and hypokalemia.  The patient feels well now and is ready to be discharged.  PHYSICAL EXAMINATION:  VITAL SIGNS:  Prior to discharge, blood pressure 110/71, respirations 20, pulse 81, temperature 98.7, and 97% on room air. GENERAL:  Comfortable, currently no acute cardiopulmonary distress. HEENT:  Pupils are equal and  reactive.  Extraocular movements are intact. NECK:  Supple.  No JVD. LUNGS:  Clear to auscultation bilaterally.  No wheezes, crackles, or rhonchi. CARDIOVASCULAR:  Regular rate and rhythm.  No murmurs, rubs, or gallops. ABDOMEN: Obese, soft, tender, and nondistended.  DISCHARGE MEDICATIONS: 1. Ciprofloxacin 500 mg 1 tablet p.o. daily for 5 days. 2. K-Phos 500 mg p.o. q.12 hours for 7 days. 3. K-Dur 40 mEq p.o. twice daily for 10 days. 4. Lantus 40 units subcu at bedtime. 5. NovoLog q.a.c. with sliding scale coverage.  DISCHARGE INSTRUCTIONS:  The patient is to follow up with primary care physician in [facility] Department in 5 to 7 days.  She will need a repeat phosphorus and BMP panel in about 7 to 10 days.     Richarda Overlie, MD     NA/MEDQ  D:  06/13/2011  T:  06/13/2011  Job:  [id number]  Electronically Signed by Richarda Overlie MD on 07/01/2011 07:30:58 AM

## 2011-07-08 ENCOUNTER — Inpatient Hospital Stay (INDEPENDENT_AMBULATORY_CARE_PROVIDER_SITE_OTHER)
Admission: RE | Admit: 2011-07-08 | Discharge: 2011-07-08 | Disposition: A | Discharge status: Home / Self Care | Payer: Medicaid Other | Source: Ambulatory Visit | Attending: Family Medicine | Admitting: Family Medicine

## 2011-07-08 DIAGNOSIS — E119 Type 2 diabetes mellitus without complications: Secondary | ICD-10-CM

## 2011-07-11 LAB — CBC
MCHC: 34.8
MCHC: 34.7
MCV: 85.1
Platelets: 240
Platelets: 235
RBC: 4.04
RBC: 4.23
RBC: 5.02
RDW: 13.7
RDW: 14.1
WBC: 10.7 — ABNORMAL HIGH
WBC: 12.6 — ABNORMAL HIGH

## 2011-07-11 LAB — BASIC METABOLIC PANEL
BUN: 13
BUN: 7
CO2: 31
Calcium: 7.4 — ABNORMAL LOW
Calcium: 8.3 — ABNORMAL LOW
Calcium: 8.7
Calcium: 8.7
Chloride: 100
Creatinine, Ser: 0.5
Creatinine, Ser: 0.64
GFR calc Af Amer: >60
GFR calc Af Amer: >60
GFR calc non Af Amer: >60
GFR calc non Af Amer: >60
GFR calc non Af Amer: >60
Glucose, Bld: 338 — ABNORMAL HIGH
Glucose, Bld: 98
Potassium: 3.2 — ABNORMAL LOW
Potassium: 3.9
Sodium: 136
Sodium: 136
Sodium: 138

## 2011-07-11 LAB — HEMOGLOBIN A1C
Hgb A1c MFr Bld: 16.6 — ABNORMAL HIGH
Mean Plasma Glucose: 514

## 2011-07-11 LAB — URINALYSIS, ROUTINE W REFLEX MICROSCOPIC
Bilirubin Urine: NEGATIVE
Glucose, UA: >1000 — AB
Glucose, UA: >1000 — AB
Hgb urine dipstick: NEGATIVE
Hgb urine dipstick: NEGATIVE
Leukocytes, UA: NEGATIVE
Protein, ur: NEGATIVE
Specific Gravity, Urine: 1.01
Urobilinogen, UA: 0.2
pH: 5
pH: 5.5

## 2011-07-11 LAB — COMPREHENSIVE METABOLIC PANEL
ALT: 32
ALT: 27
AST: 22
Albumin: 4.4
Albumin: 3.6
Alkaline Phosphatase: 112
Alkaline Phosphatase: 95
BUN: 13
CO2: 18 — ABNORMAL LOW
Chloride: 90 — ABNORMAL LOW
Chloride: 94 — ABNORMAL LOW
GFR calc Af Amer: >60
GFR calc non Af Amer: >60
Glucose, Bld: 468 — ABNORMAL HIGH
Potassium: 3.8
Potassium: 4.1
Sodium: 128 — ABNORMAL LOW
Sodium: 128 — ABNORMAL LOW
Total Bilirubin: 1.7 — ABNORMAL HIGH
Total Bilirubin: 2 — ABNORMAL HIGH
Total Protein: 8.1

## 2011-07-11 LAB — FOLATE: Folate: 9

## 2011-07-11 LAB — URINE MICROSCOPIC-ADD ON

## 2011-07-11 LAB — RAPID STREP SCREEN (MED CTR MEBANE ONLY): Streptococcus, Group A Screen (Direct): NEGATIVE

## 2011-07-11 LAB — LIPID PANEL
Cholesterol: 146
Total CHOL/HDL Ratio: 6.3

## 2011-07-11 LAB — POCT PREGNANCY, URINE
Operator id: 234331
Preg Test, Ur: NEGATIVE

## 2011-07-11 LAB — WET PREP, GENITAL
Trich, Wet Prep: NONE SEEN
Yeast Wet Prep HPF POC: NONE SEEN

## 2011-07-11 LAB — VITAMIN B12: Vitamin B-12: 880 (ref 211–911)

## 2011-07-11 LAB — RETICULOCYTES
Retic Count, Absolute: 35.3
Retic Ct Pct: 0.8

## 2011-07-11 LAB — IRON AND TIBC: TIBC: 154 — ABNORMAL LOW

## 2011-07-14 LAB — POCT I-STAT, CHEM 8
BUN: 13
Calcium, Ion: 1.12
Chloride: 100
Sodium: 133 — ABNORMAL LOW

## 2011-07-14 LAB — BLOOD GAS, ARTERIAL
Bicarbonate: 20.8
TCO2: 19.5
pCO2 arterial: 45.4 — ABNORMAL HIGH
pH, Arterial: 7.283 — ABNORMAL LOW
pO2, Arterial: 22.3 — CL

## 2011-07-14 LAB — BASIC METABOLIC PANEL
CO2: 22
Glucose, Bld: 70
Potassium: 2.7 — CL
Sodium: 137

## 2011-07-14 LAB — URINALYSIS, ROUTINE W REFLEX MICROSCOPIC
Bilirubin Urine: NEGATIVE
Hgb urine dipstick: NEGATIVE
Specific Gravity, Urine: 1.034 — ABNORMAL HIGH
Urobilinogen, UA: 0.2

## 2011-07-14 LAB — CBC
HCT: 41.1
Hemoglobin: 14.2
MCHC: 34.7
MCV: 87.8
RBC: 4.68

## 2011-07-14 LAB — DIFFERENTIAL
Basophils Relative: 1
Monocytes Absolute: 0.7
Monocytes Relative: 6
Neutro Abs: 8.1 — ABNORMAL HIGH

## 2011-07-14 LAB — URINE MICROSCOPIC-ADD ON

## 2011-07-15 LAB — GLUCOSE, CAPILLARY
Glucose-Capillary: 166 — ABNORMAL HIGH
Glucose-Capillary: 173 — ABNORMAL HIGH
Glucose-Capillary: 206 — ABNORMAL HIGH
Glucose-Capillary: 249 — ABNORMAL HIGH
Glucose-Capillary: 442 — ABNORMAL HIGH

## 2011-07-15 LAB — CBC
HCT: 38.8
HCT: 39.5
Hemoglobin: 12.9
Hemoglobin: 13.3
MCV: 89
MCV: 89.8
Platelets: 195
RBC: 4.32
RDW: 14
WBC: 11.3 — ABNORMAL HIGH
WBC: 11.6 — ABNORMAL HIGH

## 2011-07-15 LAB — BASIC METABOLIC PANEL
BUN: 3 — ABNORMAL LOW
BUN: 3 — ABNORMAL LOW
BUN: 3 — ABNORMAL LOW
BUN: 5 — ABNORMAL LOW
CO2: 26
Calcium: 8.2 — ABNORMAL LOW
Chloride: 105
Chloride: 107
Chloride: 107
Creatinine, Ser: 0.36 — ABNORMAL LOW
Creatinine, Ser: 0.4
GFR calc Af Amer: >60
GFR calc non Af Amer: >60
Glucose, Bld: 202 — ABNORMAL HIGH
Glucose, Bld: 265 — ABNORMAL HIGH
Glucose, Bld: 95
Potassium: 3.8
Potassium: 3.9
Sodium: 139

## 2011-07-15 LAB — DIFFERENTIAL
Eosinophils Absolute: 0.2
Eosinophils Relative: 1
Lymphs Abs: 3.7
Monocytes Absolute: 0.6

## 2011-07-15 LAB — TSH: TSH: 1.317

## 2011-07-18 LAB — BASIC METABOLIC PANEL
BUN: 11
Calcium: 10.2
Chloride: 96
Creatinine, Ser: 0.85

## 2011-07-18 LAB — GLUCOSE, CAPILLARY
Glucose-Capillary: 219 — ABNORMAL HIGH
Glucose-Capillary: 564
Glucose-Capillary: 99

## 2011-07-18 LAB — URINE MICROSCOPIC-ADD ON

## 2011-07-18 LAB — URINALYSIS, ROUTINE W REFLEX MICROSCOPIC
Bilirubin Urine: NEGATIVE
Leukocytes, UA: NEGATIVE
Nitrite: NEGATIVE
Protein, ur: NEGATIVE
Urobilinogen, UA: 0.2
pH: 5.5

## 2011-07-18 LAB — URINE CULTURE: Colony Count: >=100000

## 2011-07-18 LAB — DIFFERENTIAL
Basophils Relative: 0
Eosinophils Absolute: 0.1
Lymphs Abs: 1.7
Neutro Abs: 7.4
Neutrophils Relative %: 76

## 2011-07-18 LAB — CBC
MCHC: 33.1
MCV: 89.8
Platelets: 218
WBC: 9.8

## 2011-07-27 NOTE — H&P (Signed)
  [name], [name]           ACCOUNT NO.:  [id number]  MEDICAL RECORD NO.:  [id number]  LOCATION:  1223                         FACILITY:  [facility]  PHYSICIAN:  Baltazar Najjar, MD     DATE OF BIRTH:  10-23-1985  DATE OF ADMISSION:  06/11/2011 DATE OF DISCHARGE:                             HISTORY & PHYSICAL   ADDENDUM:  PHYSICAL EXAMINATION:  VITAL SIGNS:  Blood pressure 111/72, heart rate of 82, temperature 97.9, O2 sat 97% on room air. GENERAL:  She is alert, in moderate distress. HEENT:  Dry mucous membranes noted. NECK:  Supple. CHEST:  Clear to auscultation bilaterally.  No rales or wheezing appreciated. CARDIOVASCULAR:  S1, S2 regular rhythm and rate. ABDOMEN:  Soft, nontender.  Bowel sounds heard normally. EXTREMITIES:  No pedal edema. NEUROLOGIC:  She is alert, oriented x3.  No focal neurological deficit appreciated.  Slightly drowsy.  LABORATORY AND X-RAY RESULTS:  Sodium 133, glucose 486, corrected sodium is 141, potassium 4.1, CO2 28, calculated anion gap around 29, BUN 10, creatinine 0.77.  ABG showed pH of 7.193, pCO2 16.5, pO2 99.5, bicarbonate 6.1.  O2 sat 97.2.  WBCs 10.2, hemoglobin 15, hematocrit 43, platelets 303.  Chest x-ray showed normal exam.  ASSESSMENT: 1. Diabetic ketoacidosis. 2. Uncontrolled type 1 diabetes mellitus. 3. Medical noncompliance. 4. Urinary tract infection. 5. Hyponatremia secondary to hyperglycemia, with corrected sodium     level of 141.  PLAN:  The patient will be admitted to the step-down unit.  We will start her on insulin drip and half normal saline at 500 mL/hour.  That can be transitioned to D5 half NS at 250 mL/hour when CBG is less than 250, as per protocol.  Potassium will be replaced as per protocol as well.  She will be kept n.p.o. and we will monitor her electrolytes very closely.  Insulin drip can be transitioned to subcu insulin when her DKA resolves.  For UTI, the patient will be started on Rocephin.  Urine  culture will be sent.  Diabetic Educator/Coordinator consult.  Check hemoglobin A1c.  The patient will need Social Worker consult to assign PCP on discharge.          ______________________________ Baltazar Najjar, MD     SA/MEDQ  D:  06/11/2011  T:  06/11/2011  Job:  [id number]  Electronically Signed by Hannah Beat MD on 07/27/2011 07:21:54 PM

## 2011-07-27 NOTE — H&P (Signed)
  [name], [name]           ACCOUNT NO.:  [id number]  MEDICAL RECORD NO.:  [id number]  LOCATION:  1223                         FACILITY:  [facility]  PHYSICIAN:  Baltazar Najjar, MD     DATE OF BIRTH:  21-Nov-1985  DATE OF ADMISSION:  06/11/2011 DATE OF DISCHARGE:                             HISTORY & PHYSICAL   PRIMARY CARE PHYSICIAN:  Unassigned.  CHIEF COMPLAINT:  Nausea, vomiting, increasing urinary frequency, thirst, and generalized weakness.  HISTORY: 25 y/o women with type 1 DM  .She stated that she  ran out of her insulin 9 days ago.  She denies any fever or chills.  Denies dysuria; however, has increasing urinary frequency.  No flank pain.  She has mild dry cough on and off.  In the ED, the patient was found to have elevated glucose with anion gap metabolic acidosis and ketonuria, and we were asked to admit for DKA. Her urinalysis also showed 7 to 10 wbc's.  The patient was started on insulin drip and IV fluids and Rocephin in the ED.  PAST MEDICAL HISTORY: 1. Type 1 diabetes mellitus, uncontrolled. 2. Medical noncompliance.  PAST SURGICAL HISTORY:  Tubal ligation.  SOCIAL HISTORY:  Lives with her grandmother, has 2 kids, smokes half a pack of cigarettes since age 69.  Denies use of alcohol or illicit drug use.  FAMILY HISTORY:  Denies any family history of diabetes.  Stated that her father died of heart attack and her mother has cardiac disease as well.  REVIEW OF SYSTEMS:  As above in HPI, just feeling generally weak, polyuria, thirst, and dry cough on and off with no fever or chills.  She also has nausea.  Denies any change in her bowel habits.  No headaches. No motor weakness or numbness.  ALLERGIES:  She is allergic to: 1. CODEINE. 2. PENICILLIN. 3. TRAMADOL.  HOME MEDICATIONS: 1. Lantus insulin 40 units daily subcutaneously at bedtime. 2. NovoLog insulin 0 to 20 units 3 times a day as needed.  PHYSICAL EXAMINATION:  VITAL SIGNS:  Blood pressure  111/72, heart rate 82, respiratory rate 20, temperature 97.9, and O2 saturations 97% on room air. GENERAL:  She is alert on moderate distress  DICTATION ENDED AT THIS POINT          ______________________________ Baltazar Najjar, MD     SA/MEDQ  D:  06/11/2011  T:  06/11/2011  Job:  [id number]  Electronically Signed by Hannah Beat MD on 07/27/2011 07:24:46 PM

## 2011-08-01 LAB — I-STAT 8, (EC8 V) (CONVERTED LAB)
BUN: 8
Bicarbonate: 26.4 — ABNORMAL HIGH
Glucose, Bld: 75
Sodium: 138

## 2011-08-01 LAB — DIFFERENTIAL
Basophils Absolute: 0
Lymphocytes Relative: 15
Neutro Abs: 5.6
Neutrophils Relative %: 69

## 2011-08-01 LAB — URINALYSIS, ROUTINE W REFLEX MICROSCOPIC
Ketones, ur: 15 — AB
Nitrite: NEGATIVE
Specific Gravity, Urine: 1.027
pH: 5.5

## 2011-08-01 LAB — WET PREP, GENITAL: Yeast Wet Prep HPF POC: NONE SEEN

## 2011-08-01 LAB — POCT PREGNANCY, URINE: Operator id: 288831

## 2011-08-01 LAB — CBC
Platelets: 218
RDW: 14

## 2011-08-01 LAB — GC/CHLAMYDIA PROBE AMP, GENITAL: GC Probe Amp, Genital: NEGATIVE

## 2011-10-02 ENCOUNTER — Encounter (HOSPITAL_COMMUNITY): Payer: Self-pay | Admitting: *Deleted

## 2011-10-02 ENCOUNTER — Emergency Department (HOSPITAL_COMMUNITY): Payer: Medicaid Other

## 2011-10-02 ENCOUNTER — Emergency Department (HOSPITAL_COMMUNITY)
Admission: EM | Admit: 2011-10-02 | Discharge: 2011-10-02 | Disposition: A | Discharge status: Home / Self Care | Payer: Medicaid Other | Attending: Emergency Medicine | Admitting: Emergency Medicine

## 2011-10-02 DIAGNOSIS — IMO0001 Reserved for inherently not codable concepts without codable children: Secondary | ICD-10-CM | POA: Insufficient documentation

## 2011-10-02 DIAGNOSIS — R197 Diarrhea, unspecified: Secondary | ICD-10-CM | POA: Insufficient documentation

## 2011-10-02 DIAGNOSIS — R05 Cough: Secondary | ICD-10-CM | POA: Insufficient documentation

## 2011-10-02 DIAGNOSIS — R112 Nausea with vomiting, unspecified: Secondary | ICD-10-CM | POA: Insufficient documentation

## 2011-10-02 DIAGNOSIS — R059 Cough, unspecified: Secondary | ICD-10-CM | POA: Insufficient documentation

## 2011-10-02 DIAGNOSIS — R509 Fever, unspecified: Secondary | ICD-10-CM | POA: Insufficient documentation

## 2011-10-02 DIAGNOSIS — Z794 Long term (current) use of insulin: Secondary | ICD-10-CM | POA: Insufficient documentation

## 2011-10-02 DIAGNOSIS — E119 Type 2 diabetes mellitus without complications: Secondary | ICD-10-CM | POA: Insufficient documentation

## 2011-10-02 DIAGNOSIS — J111 Influenza due to unidentified influenza virus with other respiratory manifestations: Secondary | ICD-10-CM | POA: Insufficient documentation

## 2011-10-02 LAB — CBC
HCT: 39.1 % (ref 36.0–46.0)
Hemoglobin: 14 g/dL (ref 12.0–15.0)
MCHC: 35.8 g/dL (ref 30.0–36.0)
MCV: 84.3 fL (ref 78.0–100.0)
RDW: 13.9 % (ref 11.5–15.5)
WBC: 9.5 10*3/uL (ref 4.0–10.5)

## 2011-10-02 LAB — BASIC METABOLIC PANEL
BUN: 7 mg/dL (ref 6–23)
CO2: 26 mEq/L (ref 19–32)
Calcium: 9.4 mg/dL (ref 8.4–10.5)
Chloride: 99 mEq/L (ref 96–112)
Creatinine, Ser: 0.46 mg/dL — ABNORMAL LOW (ref 0.50–1.10)

## 2011-10-02 LAB — DIFFERENTIAL
Basophils Absolute: 0 10*3/uL (ref 0.0–0.1)
Eosinophils Relative: 0 % (ref 0–5)
Lymphocytes Relative: 18 % (ref 12–46)
Monocytes Absolute: 0.8 10*3/uL (ref 0.1–1.0)
Monocytes Relative: 9 % (ref 3–12)

## 2011-10-02 MED ORDER — ONDANSETRON HCL 8 MG PO TABS: 8.0000 mg | ORAL_TABLET | Freq: Three times a day (TID) | ORAL | Status: AC | PRN

## 2011-10-02 MED ORDER — IBUPROFEN 200 MG PO TABS
ORAL_TABLET | ORAL | Status: AC
  Filled 2011-10-02: qty 1

## 2011-10-02 MED ORDER — IBUPROFEN 200 MG PO TABS
600.0000 mg | ORAL_TABLET | Freq: Once | ORAL | Status: AC
  Administered 2011-10-02: 600 mg via ORAL
  Filled 2011-10-02: qty 3

## 2011-10-02 MED ORDER — ONDANSETRON HCL 4 MG/2ML IJ SOLN
4.0000 mg | Freq: Once | INTRAMUSCULAR | Status: AC
  Administered 2011-10-02: 4 mg via INTRAVENOUS
  Filled 2011-10-02: qty 2

## 2011-10-02 MED ORDER — SODIUM CHLORIDE 0.9 % IV BOLUS (SEPSIS)
1000.0000 mL | Freq: Once | INTRAVENOUS | Status: AC
  Administered 2011-10-02: 1000 mL via INTRAVENOUS

## 2011-10-02 NOTE — ED Provider Notes (Signed)
History     CSN: [id number] Arrival date & time: 10/02/2011  1:28 PM   First MD Initiated Contact with Patient 10/02/11 1416      Chief Complaint  Patient presents with  . Influenza    (Consider location/radiation/quality/duration/timing/severity/associated sxs/prior treatment) HPI Reports flu symptoms x 4-5 days. Having body aches, fever, chills, cough, n/v/d. Mask on pt at triage.  Past Medical History  Diagnosis Date  . Diabetes mellitus   . DKA 05/01/2011    Past Surgical History  Procedure Date  . Tubal ligation     Family History  Problem Relation Age of Onset  . Coronary artery disease Mother   . Lung cancer Mother   . Heart attack Father 24    History  Substance Use Topics  . Smoking status: Current Everyday Smoker -- 0.5 packs/day for 12 years    Types: Cigarettes  . Smokeless tobacco: Never Used   Comment: She is currently trying to quit.  . Alcohol Use: No    OB History    Grav Para Term Preterm Abortions TAB SAB Ect Mult Living   2 2              Review of Systems  All other systems reviewed and are negative.    Allergies  Codeine; Penicillins; and Tramadol  Home Medications   Current Outpatient Rx  Name Route Sig Dispense Refill  . IBUPROFEN 200 MG PO TABS Oral Take 400 mg by mouth 2 (two) times daily. For pain     . INSULIN ASPART 100 UNIT/ML [place] SOLN Subcutaneous Inject 2-4 Units into the skin 3 (three) times daily before meals. Use sliding scale with meals 3 times a day: If 150-250, inject 2 units If 250-350, inject 4 units     . INSULIN GLARGINE 100 UNIT/ML [place] SOLN Subcutaneous Inject 40 Units into the skin at bedtime.        BP 134/81  Pulse 117  Temp(Src) 101.4 F (38.6 C) (Oral)  Resp 20  SpO2 94%  LMP 09/25/2011  Physical Exam  Nursing note and vitals reviewed. Constitutional: She is oriented to person, place, and time. She appears well-developed and well-nourished. No distress.  HENT:  Head: Normocephalic and  atraumatic.  Eyes: Pupils are equal, round, and reactive to light.  Neck: Normal range of motion.  Cardiovascular: Normal rate and intact distal pulses.   Pulmonary/Chest: No respiratory distress.  Abdominal: Normal appearance. She exhibits no distension.  Musculoskeletal: Normal range of motion.  Neurological: She is alert and oriented to person, place, and time. No cranial nerve deficit.  Skin: Skin is warm and dry. No rash noted.  Psychiatric: She has a normal mood and affect. Her behavior is normal.    ED Course  Procedures (including critical care time)  Labs Reviewed  GLUCOSE, CAPILLARY - Abnormal; Notable for the following:    Glucose-Capillary 273 (*)    All other components within normal limits  BASIC METABOLIC PANEL - Abnormal; Notable for the following:    Glucose, Bld 198 (*)    Creatinine, Ser 0.46 (*)    All other components within normal limits  CBC  DIFFERENTIAL   Dg Chest 2 View  10/02/2011  *RADIOLOGY REPORT*  Clinical Data: Fever and congestion.  CHEST - 2 VIEW  Comparison: Chest x-ray 06/11/2011.  Findings: The cardiac silhouette, mediastinal and hilar contours are within normal limits.  There is mild hyperinflation and mild peribronchial thickening which may suggest reactive airways disease or bronchitis.  No  focal infiltrate or effusion.  The bony thorax is intact.  IMPRESSION: Peribronchial thickening and hyperinflation suggesting bronchitis. No focal infiltrates.  Original Report Authenticated By: P. Loralie Champagne, M.D.     1. Influenza       MDM          Nelia Shi, MD 10/02/11 [phone]

## 2011-10-02 NOTE — ED Notes (Signed)
CBG - 273 

## 2011-10-02 NOTE — ED Notes (Signed)
Reports flu symptoms x 4-5 days. Having body aches, fever, chills, cough, n/v/d. Mask on pt at triage.

## 2011-10-16 ENCOUNTER — Encounter (HOSPITAL_COMMUNITY): Payer: Self-pay | Admitting: Nurse Practitioner

## 2011-10-16 ENCOUNTER — Observation Stay (HOSPITAL_COMMUNITY)
Admission: EM | Admit: 2011-10-16 | Discharge: 2011-10-16 | Disposition: A | Discharge status: Home / Self Care | Payer: Medicaid Other | Attending: Emergency Medicine | Admitting: Emergency Medicine

## 2011-10-16 DIAGNOSIS — E1065 Type 1 diabetes mellitus with hyperglycemia: Secondary | ICD-10-CM | POA: Insufficient documentation

## 2011-10-16 DIAGNOSIS — E876 Hypokalemia: Secondary | ICD-10-CM

## 2011-10-16 DIAGNOSIS — R109 Unspecified abdominal pain: Secondary | ICD-10-CM | POA: Insufficient documentation

## 2011-10-16 DIAGNOSIS — Z794 Long term (current) use of insulin: Secondary | ICD-10-CM | POA: Insufficient documentation

## 2011-10-16 DIAGNOSIS — E1165 Type 2 diabetes mellitus with hyperglycemia: Secondary | ICD-10-CM

## 2011-10-16 DIAGNOSIS — R739 Hyperglycemia, unspecified: Secondary | ICD-10-CM

## 2011-10-16 DIAGNOSIS — R112 Nausea with vomiting, unspecified: Principal | ICD-10-CM | POA: Insufficient documentation

## 2011-10-16 DIAGNOSIS — E860 Dehydration: Secondary | ICD-10-CM | POA: Insufficient documentation

## 2011-10-16 DIAGNOSIS — F17200 Nicotine dependence, unspecified, uncomplicated: Secondary | ICD-10-CM | POA: Insufficient documentation

## 2011-10-16 DIAGNOSIS — IMO0002 Reserved for concepts with insufficient information to code with codable children: Secondary | ICD-10-CM | POA: Insufficient documentation

## 2011-10-16 LAB — POCT I-STAT 3, VENOUS BLOOD GAS (G3P V)
Acid-Base Excess: 4 mmol/L — ABNORMAL HIGH (ref 0.0–2.0)
Bicarbonate: 31.1 mEq/L — ABNORMAL HIGH (ref 20.0–24.0)
O2 Saturation: 15 %
TCO2: 33 mmol/L (ref 0–100)
pCO2, Ven: 51.9 mmHg — ABNORMAL HIGH (ref 45.0–50.0)
pH, Ven: 7.386 — ABNORMAL HIGH (ref 7.250–7.300)
pO2, Ven: 13 mmHg — CL (ref 30.0–45.0)

## 2011-10-16 LAB — URINALYSIS, ROUTINE W REFLEX MICROSCOPIC
Bilirubin Urine: NEGATIVE
Glucose, UA: >1000 mg/dL — AB
Hgb urine dipstick: NEGATIVE
Ketones, ur: >80 mg/dL — AB
Leukocytes, UA: NEGATIVE
Nitrite: NEGATIVE
Protein, ur: NEGATIVE mg/dL
Specific Gravity, Urine: 1.038 — ABNORMAL HIGH (ref 1.005–1.030)
Urobilinogen, UA: 0.2 mg/dL (ref 0.0–1.0)
pH: 5.5 (ref 5.0–8.0)

## 2011-10-16 LAB — BASIC METABOLIC PANEL
BUN: 21 mg/dL (ref 6–23)
CO2: 27 mEq/L (ref 19–32)
Calcium: 10.4 mg/dL (ref 8.4–10.5)
Chloride: 85 mEq/L — ABNORMAL LOW (ref 96–112)
Creatinine, Ser: 0.58 mg/dL (ref 0.50–1.10)
GFR calc Af Amer: >90 mL/min (ref >90)
GFR calc non Af Amer: >90 mL/min (ref >90)
Glucose, Bld: 581 mg/dL (ref 70–99)
Potassium: 3.4 mEq/L — ABNORMAL LOW (ref 3.5–5.1)
Sodium: 131 mEq/L — ABNORMAL LOW (ref 135–145)

## 2011-10-16 LAB — CBC
HCT: 46.6 % — ABNORMAL HIGH (ref 36.0–46.0)
Hemoglobin: 16.5 g/dL — ABNORMAL HIGH (ref 12.0–15.0)
MCH: 30.2 pg (ref 26.0–34.0)
MCHC: 35.4 g/dL (ref 30.0–36.0)
MCV: 85.2 fL (ref 78.0–100.0)
Platelets: 370 10*3/uL (ref 150–400)
RBC: 5.47 MIL/uL — ABNORMAL HIGH (ref 3.87–5.11)
RDW: 14.3 % (ref 11.5–15.5)
WBC: 12.5 10*3/uL — ABNORMAL HIGH (ref 4.0–10.5)

## 2011-10-16 LAB — URINE MICROSCOPIC-ADD ON

## 2011-10-16 LAB — PREGNANCY, URINE: Preg Test, Ur: NEGATIVE

## 2011-10-16 LAB — GLUCOSE, CAPILLARY
Glucose-Capillary: 486 mg/dL — ABNORMAL HIGH (ref 70–99)
Glucose-Capillary: 291 mg/dL — ABNORMAL HIGH (ref 70–99)

## 2011-10-16 MED ORDER — POTASSIUM CHLORIDE CRYS ER 20 MEQ PO TBCR
40.0000 meq | EXTENDED_RELEASE_TABLET | Freq: Once | ORAL | Status: AC
  Administered 2011-10-16: 40 meq via ORAL
  Filled 2011-10-16: qty 2

## 2011-10-16 MED ORDER — ONDANSETRON HCL 4 MG/2ML IJ SOLN: 4.0000 mg | Freq: Once | INTRAMUSCULAR | Status: DC

## 2011-10-16 MED ORDER — INSULIN ASPART 100 UNIT/ML ~~LOC~~ SOLN
SUBCUTANEOUS | Status: AC
  Administered 2011-10-16: 15 [IU]
  Filled 2011-10-16: qty 1

## 2011-10-16 MED ORDER — ONDANSETRON HCL 4 MG/2ML IJ SOLN
4.0000 mg | Freq: Once | INTRAMUSCULAR | Status: AC
  Administered 2011-10-16: 4 mg via INTRAVENOUS
  Filled 2011-10-16: qty 2

## 2011-10-16 MED ORDER — INSULIN REGULAR HUMAN 100 UNIT/ML IJ SOLN
15.0000 [IU] | Freq: Once | INTRAMUSCULAR | Status: DC
Start: 2011-10-16 — End: 2011-10-16
  Administered 2011-10-16: 15 [IU] via SUBCUTANEOUS
  Filled 2011-10-16: qty 0.15

## 2011-10-16 MED ORDER — INSULIN REGULAR HUMAN 100 UNIT/ML IJ SOLN
15.0000 [IU] | Freq: Once | INTRAMUSCULAR | Status: AC
  Filled 2011-10-16: qty 0.15

## 2011-10-16 MED ORDER — SODIUM CHLORIDE 0.9 % IV BOLUS (SEPSIS)
1000.0000 mL | Freq: Once | INTRAVENOUS | Status: AC
  Administered 2011-10-16: 1000 mL via INTRAVENOUS

## 2011-10-16 MED ORDER — SODIUM CHLORIDE 0.9 % IV SOLN: Freq: Once | INTRAVENOUS | Status: DC

## 2011-10-16 MED ORDER — ONDANSETRON 8 MG PO TBDP: 8.0000 mg | ORAL_TABLET | Freq: Three times a day (TID) | ORAL | Status: AC | PRN

## 2011-10-16 MED ORDER — ONDANSETRON HCL 4 MG/2ML IJ SOLN: 4.0000 mg | Freq: Four times a day (QID) | INTRAMUSCULAR | Status: DC | PRN

## 2011-10-16 MED ORDER — ACETAMINOPHEN 325 MG PO TABS: 650.0000 mg | ORAL_TABLET | Freq: Four times a day (QID) | ORAL | Status: DC | PRN

## 2011-10-16 MED ORDER — SODIUM CHLORIDE 0.9 % IV BOLUS (SEPSIS)
2000.0000 mL | Freq: Once | INTRAVENOUS | Status: AC
  Administered 2011-10-16: 1000 mL via INTRAVENOUS

## 2011-10-16 MED ORDER — POTASSIUM CHLORIDE 10 MEQ/100ML IV SOLN
10.0000 meq | Freq: Once | INTRAVENOUS | Status: AC
  Administered 2011-10-16: 10 meq via INTRAVENOUS
  Filled 2011-10-16: qty 100

## 2011-10-16 NOTE — ED Notes (Signed)
Abnormal labs where given to Dr. Juleen China

## 2011-10-16 NOTE — ED Notes (Signed)
C/o n/v unable to hold any oral intake since Friday night. C/o chills and not feeling well

## 2011-10-16 NOTE — ED Provider Notes (Signed)
History     CSN: [id number]  Arrival date & time 10/16/11  1543   First MD Initiated Contact with Patient 10/16/11 1751      Chief Complaint  Patient presents with  . Nausea  . Emesis    (Consider location/radiation/quality/duration/timing/severity/associated sxs/prior treatment) HPI  Past Medical History  Diagnosis Date  . Diabetes mellitus   . DKA 05/01/2011    Past Surgical History  Procedure Date  . Tubal ligation     Family History  Problem Relation Age of Onset  . Coronary artery disease Mother   . Lung cancer Mother   . Heart attack Father 86    History  Substance Use Topics  . Smoking status: Current Everyday Smoker -- 0.5 packs/day for 12 years    Types: Cigarettes  . Smokeless tobacco: Never Used   Comment: She is currently trying to quit.  . Alcohol Use: No    OB History    Grav Para Term Preterm Abortions TAB SAB Ect Mult Living   2 2              Review of Systems  Allergies  Codeine; Penicillins; and Tramadol  Home Medications   Current Outpatient Rx  Name Route Sig Dispense Refill  . IBUPROFEN 200 MG PO TABS Oral Take 400 mg by mouth 2 (two) times daily. For pain     . INSULIN ASPART 100 UNIT/ML [place] SOLN Subcutaneous Inject 2-4 Units into the skin 3 (three) times daily before meals. Use sliding scale with meals 3 times a day: If 150-250, inject 2 units If 250-350, inject 4 units     . INSULIN GLARGINE 100 UNIT/ML [place] SOLN Subcutaneous Inject 40 Units into the skin at bedtime.        BP 141/96  Pulse 124  Temp(Src) 98.3 F (36.8 C) (Oral)  Resp 16  Ht 5\' 1"  (1.549 m)  Wt 112 lb (50.803 kg)  BMI 21.16 kg/m2  SpO2 97%  LMP 09/25/2011  Physical Exam  ED Course  Procedures (including critical care time)  Labs Reviewed  GLUCOSE, CAPILLARY - Abnormal; Notable for the following:    Glucose-Capillary 486 (*)    All other components within normal limits  CBC - Abnormal; Notable for the following:    WBC 12.5 (*)    RBC 5.47  (*)    Hemoglobin 16.5 (*)    HCT 46.6 (*)    All other components within normal limits  BASIC METABOLIC PANEL - Abnormal; Notable for the following:    Sodium 131 (*)    Potassium 3.4 (*)    Chloride 85 (*)    Glucose, Bld 581 (*)    All other components within normal limits  URINALYSIS, ROUTINE W REFLEX MICROSCOPIC - Abnormal; Notable for the following:    Specific Gravity, Urine 1.038 (*)    Glucose, UA >1000 (*)    Ketones, ur >80 (*)    All other components within normal limits  URINE MICROSCOPIC-ADD ON - Abnormal; Notable for the following:    Squamous Epithelial / LPF FEW (*)    All other components within normal limits  POCT I-STAT 3, BLOOD GAS (G3P V) - Abnormal; Notable for the following:    pH, Ven 7.386 (*)    pCO2, Ven 51.9 (*)    pO2, Ven 13.0 (*)    Bicarbonate 31.1 (*)    Acid-Base Excess 4.0 (*)    All other components within normal limits  GLUCOSE, CAPILLARY - Abnormal; Notable  for the following:    Glucose-Capillary 291 (*)    All other components within normal limits  PREGNANCY, URINE  POCT CBG MONITORING  POCT CBG MONITORING  GLUCOSE, RANDOM  GLUCOSE, RANDOM  GLUCOSE, RANDOM   No results found.   1. Uncontrolled diabetes mellitus   2. Hyperglycemia   3. Dehydration   4. Nausea and vomiting   5. Hypokalemia    9:08 PM change of shift: Handoff from Dr. Juleen China. Patient with hyperglycemia, no evidence of DKA. She is receiving fluids and insulin. Plan is to discharge when blood sugars improved.  9:41 PM blood sugar is improved into the upper 200s. Patient seen. She states she is feeling better. He will give another dose of Zofran prior to discharge. Patient urged to return with worsening symptoms, persistent vomiting, high fever, or she has any other concerns. Patient verbalizes understanding and agrees with plan.   MDM  Hyperglycemia without DKA, improved. Patient appears well and is stable for discharge home.        [name],  [place] 10/16/11 2145

## 2011-10-16 NOTE — ED Provider Notes (Signed)
History    25yf with n/v. Onset Thursday. Has been persistent since. Developed crampy lower abdominal pain about a day later that has persisted as well. Does not lateralize. No radiation. No appreciable exacerbating or relieving factors. No unusual vaginal bleeding or discharge. No fever or chills. No cough, CP or SOB. HX of DM1 and sugars have been in 300-400s past few days which is unusual for her. Polyuria. Denies recent change in insulin dosing. Stopped taking sliding scale for past 2 days because hasn't been able to eat. Says still taking lantus though.   CSN: [id number]  Arrival date & time 10/16/11  1543   First MD Initiated Contact with Patient 10/16/11 1751      Chief Complaint  Patient presents with  . Nausea  . Emesis    (Consider location/radiation/quality/duration/timing/severity/associated sxs/prior treatment) HPI  Past Medical History  Diagnosis Date  . Diabetes mellitus   . DKA 05/01/2011    Past Surgical History  Procedure Date  . Tubal ligation     Family History  Problem Relation Age of Onset  . Coronary artery disease Mother   . Lung cancer Mother   . Heart attack Father 26    History  Substance Use Topics  . Smoking status: Current Everyday Smoker -- 0.5 packs/day for 12 years    Types: Cigarettes  . Smokeless tobacco: Never Used   Comment: She is currently trying to quit.  . Alcohol Use: No    OB History    Grav Para Term Preterm Abortions TAB SAB Ect Mult Living   2 2              Review of Systems   Review of symptoms negative unless otherwise noted in HPI.   Allergies  Codeine; Penicillins; and Tramadol  Home Medications   Current Outpatient Rx  Name Route Sig Dispense Refill  . IBUPROFEN 200 MG PO TABS Oral Take 400 mg by mouth 2 (two) times daily. For pain     . INSULIN ASPART 100 UNIT/ML [place] SOLN Subcutaneous Inject 2-4 Units into the skin 3 (three) times daily before meals. Use sliding scale with meals 3 times a day: If  150-250, inject 2 units If 250-350, inject 4 units     . INSULIN GLARGINE 100 UNIT/ML [place] SOLN Subcutaneous Inject 40 Units into the skin at bedtime.        BP 141/96  Pulse 124  Temp(Src) 98.3 F (36.8 C) (Oral)  Resp 16  Ht 5\' 1"  (1.549 m)  Wt 112 lb (50.803 kg)  BMI 21.16 kg/m2  SpO2 97%  LMP 09/25/2011  Physical Exam  Nursing note and vitals reviewed. Constitutional: She appears well-developed. No distress.  HENT:  Head: Normocephalic and atraumatic.       Lips cracked and mucus membranes dry  Eyes: Conjunctivae are normal. Right eye exhibits no discharge. Left eye exhibits no discharge.  Neck: Neck supple.  Cardiovascular: Regular rhythm and normal heart sounds.  Exam reveals no gallop and no friction rub.   No murmur heard.      Tachycardia   Pulmonary/Chest: Effort normal and breath sounds normal. No respiratory distress.  Abdominal: Soft. She exhibits no distension. There is no tenderness.  Musculoskeletal: She exhibits no edema and no tenderness.  Neurological: She is alert.  Skin: Skin is warm and dry.  Psychiatric: She has a normal mood and affect. Her behavior is normal. Thought content normal.    ED Course  Procedures (including critical care time)  Labs Reviewed  GLUCOSE, CAPILLARY - Abnormal; Notable for the following:    Glucose-Capillary 486 (*)    All other components within normal limits  CBC - Abnormal; Notable for the following:    WBC 12.5 (*)    RBC 5.47 (*)    Hemoglobin 16.5 (*)    HCT 46.6 (*)    All other components within normal limits  BASIC METABOLIC PANEL - Abnormal; Notable for the following:    Sodium 131 (*)    Potassium 3.4 (*)    Chloride 85 (*)    Glucose, Bld 581 (*)    All other components within normal limits  URINALYSIS, ROUTINE W REFLEX MICROSCOPIC - Abnormal; Notable for the following:    Specific Gravity, Urine 1.038 (*)    Glucose, UA >1000 (*)    Ketones, ur >80 (*)    All other components within normal limits    URINE MICROSCOPIC-ADD ON - Abnormal; Notable for the following:    Squamous Epithelial / LPF FEW (*)    All other components within normal limits  POCT I-STAT 3, BLOOD GAS (G3P V) - Abnormal; Notable for the following:    pH, Ven 7.386 (*)    pCO2, Ven 51.9 (*)    pO2, Ven 13.0 (*)    Bicarbonate 31.1 (*)    Acid-Base Excess 4.0 (*)    All other components within normal limits  PREGNANCY, URINE  POCT CBG MONITORING   No results found.   1. Uncontrolled diabetes mellitus   2. Hyperglycemia   3. Dehydration   4. Nausea and vomiting   5. Hypokalemia       MDM  25yf with n/v. Suspect viral illness. Hyperglycemia but no evidence of DKA. Clinically dehydrated. IVF, antiemetics and insulin. Minimal laboratory hypoK but expect to further drop as tx'd. Repletion. Abdominal exam benign. Suspect pain from ongoing vomiting. Low clinical suspicion for acute surgical abdomen. Consider infectious etiology as precipitating event but clinically doubt at this time.  Plan continued symptomatic tx and blood glucose monitoring. If pt symptomatic improved and can get sugars into reasonable range feel that can be safely discharged with outpt fu.        Raeford Razor, MD 10/16/11 [phone]

## 2011-10-17 NOTE — ED Provider Notes (Signed)
Medical screening examination/treatment/procedure(s) were conducted as a shared visit with non-physician practitioner(s) and myself.  I personally evaluated the patient during the encounter.  Please see completed H&P for this encounter.  Raeford Razor, MD 10/17/11 [phone]

## 2011-10-30 ENCOUNTER — Emergency Department (HOSPITAL_COMMUNITY): Payer: Medicaid Other

## 2011-10-30 ENCOUNTER — Inpatient Hospital Stay (HOSPITAL_COMMUNITY)
Admission: EM | Admit: 2011-10-30 | Discharge: 2011-11-02 | DRG: 638 | Disposition: A | Discharge status: Home / Self Care | Payer: Medicaid Other | Attending: Internal Medicine | Admitting: Internal Medicine

## 2011-10-30 ENCOUNTER — Encounter (HOSPITAL_COMMUNITY): Payer: Self-pay | Admitting: *Deleted

## 2011-10-30 DIAGNOSIS — R109 Unspecified abdominal pain: Secondary | ICD-10-CM | POA: Diagnosis present

## 2011-10-30 DIAGNOSIS — E1310 Other specified diabetes mellitus with ketoacidosis without coma: Principal | ICD-10-CM | POA: Diagnosis present

## 2011-10-30 DIAGNOSIS — D72829 Elevated white blood cell count, unspecified: Secondary | ICD-10-CM | POA: Diagnosis present

## 2011-10-30 DIAGNOSIS — Z794 Long term (current) use of insulin: Secondary | ICD-10-CM

## 2011-10-30 DIAGNOSIS — F17200 Nicotine dependence, unspecified, uncomplicated: Secondary | ICD-10-CM | POA: Diagnosis present

## 2011-10-30 DIAGNOSIS — E860 Dehydration: Secondary | ICD-10-CM | POA: Diagnosis present

## 2011-10-30 DIAGNOSIS — N390 Urinary tract infection, site not specified: Secondary | ICD-10-CM | POA: Diagnosis present

## 2011-10-30 DIAGNOSIS — E1110 Type 2 diabetes mellitus with ketoacidosis without coma: Secondary | ICD-10-CM

## 2011-10-30 DIAGNOSIS — R112 Nausea with vomiting, unspecified: Secondary | ICD-10-CM | POA: Diagnosis present

## 2011-10-30 LAB — BLOOD GAS, ARTERIAL
Bicarbonate: 12.3 mEq/L — ABNORMAL LOW (ref 20.0–24.0)
FIO2: 0.21 %
Patient temperature: 98.6
pCO2 arterial: 26.4 mmHg — ABNORMAL LOW (ref 35.0–45.0)
pH, Arterial: 7.289 — ABNORMAL LOW (ref 7.350–7.400)
pO2, Arterial: 109 mmHg — ABNORMAL HIGH (ref 80.0–100.0)

## 2011-10-30 LAB — DIFFERENTIAL
Eosinophils Absolute: 0 10*3/uL (ref 0.0–0.7)
Eosinophils Relative: 0 % (ref 0–5)
Lymphs Abs: 1.1 10*3/uL (ref 0.7–4.0)
Monocytes Absolute: 0.7 10*3/uL (ref 0.1–1.0)
Monocytes Relative: 5 % (ref 3–12)

## 2011-10-30 LAB — CBC
HCT: 43.6 % (ref 36.0–46.0)
Hemoglobin: 15.3 g/dL — ABNORMAL HIGH (ref 12.0–15.0)
Hemoglobin: 13.6 g/dL (ref 12.0–15.0)
Hemoglobin: 12.4 g/dL (ref 12.0–15.0)
MCH: 29.7 pg (ref 26.0–34.0)
MCH: 29.7 pg (ref 26.0–34.0)
MCH: 28.9 pg (ref 26.0–34.0)
MCHC: 34.3 g/dL (ref 30.0–36.0)
MCV: 84.5 fL (ref 78.0–100.0)
MCV: 84.7 fL (ref 78.0–100.0)
Platelets: 283 10*3/uL (ref 150–400)
RBC: 5.16 MIL/uL — ABNORMAL HIGH (ref 3.87–5.11)
RBC: 4.58 MIL/uL (ref 3.87–5.11)
RDW: 14.7 % (ref 11.5–15.5)

## 2011-10-30 LAB — URINE CULTURE

## 2011-10-30 LAB — COMPREHENSIVE METABOLIC PANEL
BUN: 27 mg/dL — ABNORMAL HIGH (ref 6–23)
CO2: 12 mEq/L — ABNORMAL LOW (ref 19–32)
Calcium: 10.1 mg/dL (ref 8.4–10.5)
GFR calc Af Amer: >90 mL/min (ref >90)
GFR calc non Af Amer: >90 mL/min (ref >90)
Glucose, Bld: 453 mg/dL — ABNORMAL HIGH (ref 70–99)
Total Protein: 8.5 g/dL — ABNORMAL HIGH (ref 6.0–8.3)

## 2011-10-30 LAB — BASIC METABOLIC PANEL
CO2: 11 mEq/L — ABNORMAL LOW (ref 19–32)
CO2: 12 mEq/L — ABNORMAL LOW (ref 19–32)
Calcium: 7.9 mg/dL — ABNORMAL LOW (ref 8.4–10.5)
Chloride: 100 mEq/L (ref 96–112)
Creatinine, Ser: 0.63 mg/dL (ref 0.50–1.10)
Glucose, Bld: 191 mg/dL — ABNORMAL HIGH (ref 70–99)
Sodium: 134 mEq/L — ABNORMAL LOW (ref 135–145)

## 2011-10-30 LAB — GLUCOSE, CAPILLARY
Glucose-Capillary: 408 mg/dL — ABNORMAL HIGH (ref 70–99)
Glucose-Capillary: 277 mg/dL — ABNORMAL HIGH (ref 70–99)

## 2011-10-30 LAB — URINALYSIS, ROUTINE W REFLEX MICROSCOPIC
Bilirubin Urine: NEGATIVE
Nitrite: NEGATIVE
Protein, ur: NEGATIVE mg/dL
Specific Gravity, Urine: 1.03 (ref 1.005–1.030)
Urobilinogen, UA: 0.2 mg/dL (ref 0.0–1.0)

## 2011-10-30 LAB — URINE MICROSCOPIC-ADD ON

## 2011-10-30 LAB — LIPASE, BLOOD: Lipase: 18 U/L (ref 11–59)

## 2011-10-30 MED ORDER — DEXTROSE 50 % IV SOLN: 25.0000 mL | INTRAVENOUS | Status: DC | PRN

## 2011-10-30 MED ORDER — ONDANSETRON HCL 4 MG/2ML IJ SOLN
4.0000 mg | Freq: Four times a day (QID) | INTRAMUSCULAR | Status: DC
  Administered 2011-10-31 (×2): 4 mg via INTRAVENOUS
  Filled 2011-10-30 (×2): qty 2

## 2011-10-30 MED ORDER — SODIUM CHLORIDE 0.9 % IV SOLN: INTRAVENOUS | Status: AC

## 2011-10-30 MED ORDER — DEXTROSE-NACL 5-0.45 % IV SOLN
INTRAVENOUS | Status: DC
  Administered 2011-10-31: 01:00:00 via INTRAVENOUS

## 2011-10-30 MED ORDER — SODIUM CHLORIDE 0.9 % IV BOLUS (SEPSIS)
2000.0000 mL | Freq: Once | INTRAVENOUS | Status: AC
  Administered 2011-10-30: 1000 mL via INTRAVENOUS

## 2011-10-30 MED ORDER — SODIUM CHLORIDE 0.9 % IV SOLN
INTRAVENOUS | Status: DC
  Administered 2011-10-30: 18:00:00 via INTRAVENOUS

## 2011-10-30 MED ORDER — NICOTINE 14 MG/24HR TD PT24
14.0000 mg | MEDICATED_PATCH | Freq: Every day | TRANSDERMAL | Status: DC
  Administered 2011-10-31 – 2011-11-02 (×4): 14 mg via TRANSDERMAL
  Filled 2011-10-30 (×5): qty 1

## 2011-10-30 MED ORDER — SODIUM CHLORIDE 0.9 % IV SOLN: INTRAVENOUS | Status: DC

## 2011-10-30 MED ORDER — DEXTROSE-NACL 5-0.45 % IV SOLN: INTRAVENOUS | Status: DC

## 2011-10-30 MED ORDER — ONDANSETRON HCL 4 MG/2ML IJ SOLN
INTRAMUSCULAR | Status: AC
  Filled 2011-10-30: qty 2

## 2011-10-30 MED ORDER — POTASSIUM CHLORIDE 10 MEQ/100ML IV SOLN
10.0000 meq | INTRAVENOUS | Status: AC
  Administered 2011-10-31 (×2): 10 meq via INTRAVENOUS
  Filled 2011-10-30: qty 200

## 2011-10-30 MED ORDER — ONDANSETRON HCL 4 MG/2ML IJ SOLN
4.0000 mg | INTRAMUSCULAR | Status: DC | PRN
  Filled 2011-10-30: qty 2

## 2011-10-30 MED ORDER — SODIUM CHLORIDE 0.9 % IV SOLN
INTRAVENOUS | Status: DC
  Filled 2011-10-30: qty 1

## 2011-10-30 MED ORDER — POTASSIUM CHLORIDE 10 MEQ/100ML IV SOLN: 10.0000 meq | INTRAVENOUS | Status: AC

## 2011-10-30 MED ORDER — ENOXAPARIN SODIUM 40 MG/0.4ML ~~LOC~~ SOLN
40.0000 mg | SUBCUTANEOUS | Status: DC
  Administered 2011-10-31 – 2011-11-01 (×3): 40 mg via SUBCUTANEOUS
  Filled 2011-10-30 (×6): qty 0.4

## 2011-10-30 NOTE — ED Provider Notes (Signed)
History     CSN: [id number]  Arrival date & time 10/30/11  1441    Chief Complaint  Patient presents with  . Nausea  . Emesis    HPI Pt was seen at 1515.   Per pt, c/o gradual onset and persistence of constant "high blood sugars" x2-3 days.  Has been assoc with several intermittent episodes of N/V and constant upper abd "pain."  States her home CBG's have been "in the 500's."  Endorses she has not been taking her insulin as rx because she has not been eating.  Denies diarrhea, no black or blood in stools or emesis, no CP/SOB, no cough, no fevers, no back pain.    PMD:  Olena [facility] Past Medical History  Diagnosis Date  . Diabetes mellitus   . DKA 05/01/2011    Past Surgical History  Procedure Date  . Tubal ligation     Family History  Problem Relation Age of Onset  . Coronary artery disease Mother   . Lung cancer Mother   . Heart attack Father 75    History  Substance Use Topics  . Smoking status: Current Everyday Smoker -- 0.5 packs/day for 12 years    Types: Cigarettes  . Smokeless tobacco: Never Used   Comment: She is currently trying to quit.  . Alcohol Use: No    OB History    Grav Para Term Preterm Abortions TAB SAB Ect Mult Living   2 2              Review of Systems ROS: Statement: All systems negative except as marked or noted in the HPI; Constitutional: Negative for fever and chills. ; ; Eyes: Negative for eye pain, redness and discharge. ; ; ENMT: Negative for ear pain, hoarseness, nasal congestion, sinus pressure and sore throat. ; ; Cardiovascular: Negative for chest pain, palpitations, diaphoresis, dyspnea and peripheral edema. ; ; Respiratory: Negative for cough, wheezing and stridor. ; ; Gastrointestinal: +N/V, abd pain.  Negative for diarrhea, blood in stool, hematemesis, jaundice and rectal bleeding. . ; ; Genitourinary: Negative for dysuria, flank pain and hematuria. ; ; Musculoskeletal: Negative for back pain and neck pain. Negative for  swelling and trauma.; ; Skin: Negative for pruritus, rash, abrasions, blisters, bruising and skin lesion.; ; Neuro: Negative for headache, lightheadedness and neck stiffness. Negative for weakness, altered level of consciousness , altered mental status, extremity weakness, paresthesias, involuntary movement, seizure and syncope.     Allergies  Codeine; Penicillins; and Tramadol  Home Medications   Current Outpatient Rx  Name Route Sig Dispense Refill  . IBUPROFEN 200 MG PO TABS Oral Take 600 mg by mouth every 8 (eight) hours as needed. For pain and fever.    . INSULIN ASPART 100 UNIT/ML [place] SOLN Subcutaneous Inject 2-4 Units into the skin 3 (three) times daily before meals. Use sliding scale with meals 3 times a day: If 150-250, inject 2 units If 250-350, inject 4 units     . INSULIN GLARGINE 100 UNIT/ML  SOLN Subcutaneous Inject 40 Units into the skin at bedtime.        BP 128/74  Pulse 101  Temp(Src) 97.8 F (36.6 C) (Oral)  Resp 20  SpO2 100%  LMP 09/25/2011  Physical Exam 1550: Physical examination:  Nursing notes reviewed; Vital signs and O2 SAT reviewed;  Constitutional: Well developed, Well nourished, In no acute distress; Head:  Normocephalic, atraumatic; Eyes: EOMI, PERRL, No scleral icterus; ENMT: Mouth and pharynx normal, Mucous membranes dry;  Neck: Supple, Full range of motion, No lymphadenopathy; Cardiovascular: Regular rate and rhythm, No murmur, rub, or gallop; Respiratory: Breath sounds clear & equal bilaterally, No rales, rhonchi, wheezes, or rub, Normal respiratory effort/excursion; Chest: Nontender, Movement normal; Abdomen: Soft, Nontender, Nondistended, Normal bowel sounds; Genitourinary: No CVA tenderness; Extremities: Pulses normal, No tenderness, No edema, No calf edema or asymmetry.; Neuro: AA&Ox3, Major CN grossly intact. Speech clear, gait steady.  No gross focal motor or sensory deficits in extremities.; Skin: Color normal, Warm, Dry, no rash.    ED Course    Procedures   1800:  AG 25, +DKA, though continues to appear well, talking loudly to multiple family members at bedside.  No N/V while in ED.  VSS.  DKA order set protocol ordered.  Dx testing d/w pt.  Questions answered.  Verb understanding, agreeable to admit.    6:56 PM:  T/C to [facility], case discussed, including:  HPI, pertinent PM/SHx, VS/PE, dx testing, ED course and treatment.  Agreeable to admit.  Requests to obtain ICU bed Team 4.    MDM  MDM Reviewed: nursing note, previous chart and vitals Interpretation: labs and x-ray   CRITICAL CARE Performed by: Laray Anger Total critical care time: 31 Critical care time was exclusive of separately billable procedures and treating other patients. Critical care was necessary to treat or prevent imminent or life-threatening deterioration. Critical care was time spent personally by me on the following activities: development of treatment plan with patient and/or surrogate as well as nursing, discussions with consultants, evaluation of patient's response to treatment, examination of patient, obtaining history from patient or surrogate, ordering and performing treatments and interventions, ordering and review of laboratory studies, ordering and review of radiographic studies, pulse oximetry and re-evaluation of patient's condition.   Results for orders placed during the hospital encounter of 10/30/11  GLUCOSE, CAPILLARY      Component Value Range   Glucose-Capillary 482 (*) 70 - 99 (mg/dL)   Comment 1 Notify RN    CBC      Component Value Range   WBC 13.9 (*) 4.0 - 10.5 (K/uL)   RBC 5.16 (*) 3.87 - 5.11 (MIL/uL)   Hemoglobin 15.3 (*) 12.0 - 15.0 (g/dL)   HCT 16.1  09.6 - 04.5 (%)   MCV 84.5  78.0 - 100.0 (fL)   MCH 29.7  26.0 - 34.0 (pg)   MCHC 35.1  30.0 - 36.0 (g/dL)   RDW 40.9  81.1 - 91.4 (%)   Platelets 295  150 - 400 (K/uL)  DIFFERENTIAL      Component Value Range   Neutrophils Relative 87 (*) 43 - 77 (%)   Neutro Abs  12.1 (*) 1.7 - 7.7 (K/uL)   Lymphocytes Relative 8 (*) 12 - 46 (%)   Lymphs Abs 1.1  0.7 - 4.0 (K/uL)   Monocytes Relative 5  3 - 12 (%)   Monocytes Absolute 0.7  0.1 - 1.0 (K/uL)   Eosinophils Relative 0  0 - 5 (%)   Eosinophils Absolute 0.0  0.0 - 0.7 (K/uL)   Basophils Relative 0  0 - 1 (%)   Basophils Absolute 0.0  0.0 - 0.1 (K/uL)  COMPREHENSIVE METABOLIC PANEL      Component Value Range   Sodium 129 (*) 135 - 145 (mEq/L)   Potassium 4.9  3.5 - 5.1 (mEq/L)   Chloride 90 (*) 96 - 112 (mEq/L)   CO2 12 (*) 19 - 32 (mEq/L)   Glucose, Bld 453 (*) 70 -  99 (mg/dL)   BUN 27 (*) 6 - 23 (mg/dL)   Creatinine, Ser 1.47  0.50 - 1.10 (mg/dL)   Calcium 82.9  8.4 - 10.5 (mg/dL)   Total Protein 8.5 (*) 6.0 - 8.3 (g/dL)   Albumin 4.4  3.5 - 5.2 (g/dL)   AST 12  0 - 37 (U/L)   ALT 11  0 - 35 (U/L)   Alkaline Phosphatase 104  39 - 117 (U/L)   Total Bilirubin 0.5  0.3 - 1.2 (mg/dL)   GFR calc non Af Amer >90  >90 (mL/min)   GFR calc Af Amer >90  >90 (mL/min)  LIPASE, BLOOD      Component Value Range   Lipase 18  11 - 59 (U/L)  URINALYSIS, ROUTINE W REFLEX MICROSCOPIC      Component Value Range   Color, Urine YELLOW  YELLOW    APPearance CLOUDY (*) CLEAR    Specific Gravity, Urine 1.030  1.005 - 1.030    pH 5.0  5.0 - 8.0    Glucose, UA >1000 (*) NEGATIVE (mg/dL)   Hgb urine dipstick LARGE (*) NEGATIVE    Bilirubin Urine NEGATIVE  NEGATIVE    Ketones, ur >80 (*) NEGATIVE (mg/dL)   Protein, ur NEGATIVE  NEGATIVE (mg/dL)   Urobilinogen, UA 0.2  0.0 - 1.0 (mg/dL)   Nitrite NEGATIVE  NEGATIVE    Leukocytes, UA SMALL (*) NEGATIVE   BLOOD GAS, ARTERIAL      Component Value Range   FIO2 0.21     pH, Arterial 7.289 (*) 7.350 - 7.400    pCO2 arterial 26.4 (*) 35.0 - 45.0 (mmHg)   pO2, Arterial 109.0 (*) 80.0 - 100.0 (mmHg)   Bicarbonate 12.3 (*) 20.0 - 24.0 (mEq/L)   TCO2 11.1  0 - 100 (mmol/L)   Acid-base deficit 12.6 (*) 0.0 - 2.0 (mmol/L)   O2 Saturation 97.1     Patient temperature  98.6     Collection site LEFT RADIAL     Drawn by [id number]     Sample type ARTERIAL DRAW     Allens test (pass/fail) PASS  PASS   GLUCOSE, CAPILLARY      Component Value Range   Glucose-Capillary 408 (*) 70 - 99 (mg/dL)   Comment 1 Documented in Chart     Comment 2 Notify RN    POCT PREGNANCY, URINE      Component Value Range   Preg Test, Ur NEGATIVE    URINE MICROSCOPIC-ADD ON      Component Value Range   Squamous Epithelial / LPF RARE  RARE    WBC, UA 21-50  <3 (WBC/hpf)   RBC / HPF 0-2  <3 (RBC/hpf)   Dg Abd Acute W/chest 10/30/2011  *RADIOLOGY REPORT*  Clinical Data: Rule out small bowel obstruction, free air, pneumonia.  ACUTE ABDOMEN SERIES (ABDOMEN 2 VIEW & CHEST 1 VIEW)  Comparison: 10/02/2011  Findings: Lungs are hyperinflated.  There is perihilar peribronchial thickening.  The heart size is normal.  There are no focal consolidations or pleural effusions.  No edema.  Supine and erect views of the abdomen show a nonobstructive bowel gas pattern.  There is moderate stool within the transverse, descending, and sigmoid colon.  There is moderate air fluid level within the stomach.  IMPRESSION:  1.  Hyperinflation and bronchitic changes in the lungs. 2.  Nonobstructive bowel gas pattern.  Original Report Authenticated By: Patterson Hammersmith, M.D.        Samuel Jester Judie Petit  Laray Anger, DO 10/31/11 1941

## 2011-10-30 NOTE — ED Notes (Addendum)
EMS reports pt has had N/V x 3 days, continues to vomit. CBG 323, IV L AC #20 4mg  Zofran given

## 2011-10-30 NOTE — ED Notes (Signed)
[id number]<AV> Expected date:10/30/11<BR> Expected time: 2:36 PM<BR> Means of arrival:Ambulance<BR> Comments:<BR> M35 - 25yoF [place]

## 2011-10-30 NOTE — ED Notes (Signed)
Pt has N/V and anxiety.  Pt reports blood sugars as high as 510 at home. Pt is a type one diabetic. Pt reports being sick for two days.  Pt vomiting six times today.  Pt denies diarrhea. Pt reports upper abdominal pain that is constant 9/10.

## 2011-10-30 NOTE — H&P (Signed)
Primary Care Physician:   Chief Complaint: Abdominal pain, nausea and vomiting  History of Present Illness: Patient is a 26 year old insulin-dependent diabetic who presents for evaluation of abdominal pain, nausea and vomiting for the past 2 days. Patient reports that she was in usual state of health until a couple days ago when she started to experience abdominal pain, nausea and vomiting. Reports associated fevers and chills over the past day. Reports that everyone around the patient has been sick with similar GI bug-like symptoms. Last dose of insulin the patient gave herself was 2 days ago, when she administer 30 units of Lantus at night. Has not taken any since then because she was afraid that she would become hypoglycemic given inability to tolerate any by mouth.  In the emergency room, temperature 97.6, blood pressure 131/83, heart rate 99, respirations 21, satting 100% on 2 L nasal cannula. Labs significant for gap acidosis with urinalysis demonstrating positive ketones and glucose. Abdominal radiograph negative for any obstruction. DKA protocol was initiated, and patient brought into the hospital for further management.  Past Medical History  Diagnosis Date  . Diabetes mellitus   . DKA 05/01/2011    Past Surgical History  Procedure Date  . Tubal ligation     Allergies  Allergen Reactions  . Codeine Nausea And Vomiting  . Penicillins Swelling  . Tramadol Itching    No current facility-administered medications on file prior to encounter.   Current Outpatient Prescriptions on File Prior to Encounter  Medication Sig Dispense Refill  . ibuprofen (ADVIL,MOTRIN) 200 MG tablet Take 600 mg by mouth every 8 (eight) hours as needed. For pain and fever.      . insulin aspart (NOVOLOG) 100 UNIT/ML injection Inject 2-4 Units into the skin 3 (three) times daily before meals. Use sliding scale with meals 3 times a day: If 150-250, inject 2 units If 250-350, inject 4 units       . insulin  glargine (LANTUS) 100 UNIT/ML injection Inject 40 Units into the skin at bedtime.           Family History: No history of CAD  Social History: Separated, single mom Reports 7.5 pack year history of smoking No EtOH or illicits  Review of Systems: General: No fevers, chills, sweats, night sweats, weight loss Skin: No rashes or lacerations HEENT: No rhinorrhea, sore throat, dry mouth, hearing difficulties Pulmonary: No cough, wheezing, shortness of breath Cardivascular: No chest pain, dyspnea on exertion, palpitations, lightheaded/dizziness, paroxysmal nocturnal dyspnea, orthopnea Gastrointestinal: As per history of present illness Genitourinary: No dysuria, hematuria, increased urinary frequency/urgency. No discharge Musculoskeletal: No muscle aches, pain. No arthritis Hematologic: No easy bruising or bleeding Neurologic: No headaches, vision changes, focal neurologic deficits Psychologic: No suicidial or homicidal ideation. No depression  Filed Vitals:   10/30/11 1445 10/30/11 1608  BP: 131/83 128/74  Pulse: 99 101  Temp: 97.6 F (36.4 C) 97.8 F (36.6 C)  TempSrc: Oral Oral  Resp: 21 20  SpO2: 100% 100%    Physical Exam: General: Alert and oriented x 3, no apparent distress Skin: No rashes, bruises HEENT: Head atraumatic, sclera anicertic, pupils equal and reactive to light, mucous membranes dry with tonsils unremarkable Neck: Soft, no lymphadenopathy, thyromegaly, or bruits Chest: Clear to auscultation bilaterally, no wheezes, rales, or ronchi Heart: Tachycardic, normal S1/S2 no rubs, gallops, or murmurs Abdomen: Soft, mildly tender to palpation diffusely, nondistended, + bowel sounds, no masses Extremities: No cyanosis, clubbing, or edema. 2+ radial and dorsalis pedis pulses bilaterally Neurologic: Grossly  intact   Labs: CBC    Component Value Date/Time   WBC 13.9* 10/30/2011 1545   RBC 5.16* 10/30/2011 1545   HGB 15.3* 10/30/2011 1545   HCT 43.6 10/30/2011 1545     PLT 295 10/30/2011 1545   MCV 84.5 10/30/2011 1545   MCH 29.7 10/30/2011 1545   MCHC 35.1 10/30/2011 1545   RDW 14.6 10/30/2011 1545   LYMPHSABS 1.1 10/30/2011 1545   MONOABS 0.7 10/30/2011 1545   EOSABS 0.0 10/30/2011 1545   BASOSABS 0.0 10/30/2011 1545    BMET    Component Value Date/Time   NA 129* 10/30/2011 1545   K 4.9 10/30/2011 1545   CL 90* 10/30/2011 1545   CO2 12* 10/30/2011 1545   GLUCOSE 453* 10/30/2011 1545   BUN 27* 10/30/2011 1545   CREATININE 0.70 10/30/2011 1545   CALCIUM 10.1 10/30/2011 1545   GFRNONAA >90 10/30/2011 1545   GFRAA >90 10/30/2011 1545   Liver function tests: AST 11, ALT 12, alkaline phosphatase 104, total bilirubin 0.5, total protein 8.5, albumin 4.4  Lipase 18  Urinalysis: Cloudy with greater than 1000 glucose, greater than 80 ketones  Abdominal radiographs: IMPRESSION:  1. Hyperinflation and bronchitic changes in the lungs.  2. Nonobstructive bowel gas pattern.    Impression/Plan: 26 year old insulin-dependent diabetic who presents for evaluation of abdominal pain, nausea and vomiting for the past 2 days, afebrile and hemodynamically stable currently in DKA. Likely trigger gastroenteritis.  Diabetic ketoacidosis: - Admit to the ICU for insulin drip - Monitor electrolytes and glucose per protocol  - Restart half of home Lantus regimen when anion gap closes  Abdominal pain, nausea and vomiting: Likely secondary to gastroenteritis - Symptomatic management - Advance diet as tolerated, starting with clears - Urine culture pending  Leukocytosis and hyponatremia: Likely related to DKA - Continue to monitor  Tobacco use: - Nicotine patch  Fluid/electrolytes/nutrition: - Fluids per protocol - Monitor electrolytes per protocol - Advance diet  Prophylaxis: - Lovenox  CODE STATUS: Full code

## 2011-10-31 LAB — GLUCOSE, CAPILLARY
Glucose-Capillary: 130 mg/dL — ABNORMAL HIGH (ref 70–99)
Glucose-Capillary: 134 mg/dL — ABNORMAL HIGH (ref 70–99)
Glucose-Capillary: 210 mg/dL — ABNORMAL HIGH (ref 70–99)
Glucose-Capillary: 317 mg/dL — ABNORMAL HIGH (ref 70–99)

## 2011-10-31 LAB — HEMOGLOBIN A1C
Hgb A1c MFr Bld: 11.3 % — ABNORMAL HIGH (ref <5.7)
Mean Plasma Glucose: 278 mg/dL — ABNORMAL HIGH (ref <117)

## 2011-10-31 LAB — BASIC METABOLIC PANEL
BUN: 17 mg/dL (ref 6–23)
Calcium: 7.9 mg/dL — ABNORMAL LOW (ref 8.4–10.5)
Creatinine, Ser: 0.57 mg/dL (ref 0.50–1.10)
GFR calc non Af Amer: >90 mL/min (ref >90)
Glucose, Bld: 147 mg/dL — ABNORMAL HIGH (ref 70–99)

## 2011-10-31 MED ORDER — INSULIN GLARGINE 100 UNIT/ML ~~LOC~~ SOLN
20.0000 [IU] | Freq: Every day | SUBCUTANEOUS | Status: DC
  Administered 2011-10-31: 20 [IU] via SUBCUTANEOUS
  Filled 2011-10-31: qty 3

## 2011-10-31 MED ORDER — INSULIN GLARGINE 100 UNIT/ML ~~LOC~~ SOLN
40.0000 [IU] | Freq: Every day | SUBCUTANEOUS | Status: DC
  Administered 2011-10-31: 40 [IU] via SUBCUTANEOUS

## 2011-10-31 MED ORDER — INSULIN GLARGINE 100 UNIT/ML ~~LOC~~ SOLN: 40.0000 [IU] | Freq: Every day | SUBCUTANEOUS | Status: DC

## 2011-10-31 MED ORDER — CIPROFLOXACIN IN D5W 400 MG/200ML IV SOLN
400.0000 mg | Freq: Two times a day (BID) | INTRAVENOUS | Status: DC
  Administered 2011-10-31 – 2011-11-02 (×4): 400 mg via INTRAVENOUS
  Filled 2011-10-31 (×6): qty 200

## 2011-10-31 MED ORDER — INSULIN ASPART 100 UNIT/ML ~~LOC~~ SOLN
0.0000 [IU] | Freq: Three times a day (TID) | SUBCUTANEOUS | Status: DC
  Administered 2011-10-31: 3 [IU] via SUBCUTANEOUS
  Administered 2011-10-31: 7 [IU] via SUBCUTANEOUS
  Administered 2011-10-31: 1 [IU] via SUBCUTANEOUS
  Administered 2011-11-01: 3 [IU] via SUBCUTANEOUS
  Administered 2011-11-01 – 2011-11-02 (×2): 2 [IU] via SUBCUTANEOUS
  Administered 2011-11-02: 3 [IU] via SUBCUTANEOUS
  Filled 2011-10-31: qty 3

## 2011-10-31 MED ORDER — INSULIN ASPART 100 UNIT/ML ~~LOC~~ SOLN: 2.0000 [IU] | Freq: Three times a day (TID) | SUBCUTANEOUS | Status: DC

## 2011-10-31 MED ORDER — SODIUM CHLORIDE 0.9 % IV SOLN
INTRAVENOUS | Status: DC
  Administered 2011-10-31 (×2): via INTRAVENOUS
  Administered 2011-11-01: 1000 mL via INTRAVENOUS
  Administered 2011-11-02: 05:00:00 via INTRAVENOUS

## 2011-10-31 MED ORDER — INSULIN ASPART 100 UNIT/ML ~~LOC~~ SOLN
0.0000 [IU] | Freq: Every day | SUBCUTANEOUS | Status: DC
  Administered 2011-10-31: 4 [IU] via SUBCUTANEOUS
  Administered 2011-11-01: 5 [IU] via SUBCUTANEOUS

## 2011-10-31 MED ORDER — IBUPROFEN 600 MG PO TABS
600.0000 mg | ORAL_TABLET | Freq: Four times a day (QID) | ORAL | Status: DC | PRN
  Administered 2011-10-31 – 2011-11-02 (×2): 600 mg via ORAL
  Filled 2011-10-31 (×2): qty 1

## 2011-10-31 MED ORDER — ONDANSETRON HCL 4 MG/2ML IJ SOLN
4.0000 mg | Freq: Four times a day (QID) | INTRAMUSCULAR | Status: DC | PRN
  Administered 2011-10-31 (×2): 4 mg via INTRAVENOUS
  Filled 2011-10-31 (×2): qty 2

## 2011-10-31 MED ORDER — CIPROFLOXACIN IN D5W 400 MG/200ML IV SOLN
400.0000 mg | Freq: Once | INTRAVENOUS | Status: AC
  Administered 2011-10-31: 400 mg via INTRAVENOUS
  Filled 2011-10-31: qty 200

## 2011-10-31 NOTE — Progress Notes (Signed)
Patient ID: [name], female   DOB: May 29, 1986, 26 y.o.   MRN: [id number] Subjective: Patient seen. Feels better. Denies any nausea or vomiting. No fever, chills or Rigors.  Objective: Weight change:   Intake/Output Summary (Last 24 hours) at 10/31/11 0744 Last data filed at 10/31/11 0700  Gross per 24 hour  Intake   1134 ml  Output    600 ml  Net    534 ml   BP 104/55  Pulse 87  Temp(Src) 98 F (36.7 C) (Oral)  Resp 18  Ht 5' (1.524 m)  Wt 43 kg (94 lb 12.8 oz)  BMI 18.51 kg/m2  SpO2 97%  LMP 09/25/2011 Physical Exam: General appearance: alert, cooperative and no distress, dehydrated Head: Normocephalic, without obvious abnormality, atraumatic Neck: no adenopathy, no carotid bruit, no JVD, supple, symmetrical, trachea midline and thyroid not enlarged, symmetric, no tenderness/mass/nodules Lungs: clear to auscultation bilaterally Heart: regular rate and rhythm, S1, S2 normal, no murmur, click, rub or gallop Abdomen: soft, suprapubic tenderness; bowel sounds normal; no masses,  no organomegaly Extremities: extremities normal, atraumatic, no cyanosis or edema Skin: Decreased turgor.  Lab Results: Results for orders placed during the hospital encounter of 10/30/11 (from the past 48 hour(s))  GLUCOSE, CAPILLARY     Status: Abnormal   Collection Time   10/30/11  2:51 PM      Component Value Range Comment   Glucose-Capillary 482 (*) 70 - 99 (mg/dL)    Comment 1 Notify RN     BLOOD GAS, ARTERIAL     Status: Abnormal   Collection Time   10/30/11  3:33 PM      Component Value Range Comment   FIO2 0.21      pH, Arterial 7.289 (*) 7.350 - 7.400     pCO2 arterial 26.4 (*) 35.0 - 45.0 (mmHg)    pO2, Arterial 109.0 (*) 80.0 - 100.0 (mmHg)    Bicarbonate 12.3 (*) 20.0 - 24.0 (mEq/L)    TCO2 11.1  0 - 100 (mmol/L)    Acid-base deficit 12.6 (*) 0.0 - 2.0 (mmol/L)    O2 Saturation 97.1      Patient temperature 98.6      Collection site LEFT RADIAL      Drawn by [phone]      Sample type ARTERIAL DRAW      Allens test (pass/fail) PASS  PASS    CBC     Status: Abnormal   Collection Time   10/30/11  3:45 PM      Component Value Range Comment   WBC 13.9 (*) 4.0 - 10.5 (K/uL)    RBC 5.16 (*) 3.87 - 5.11 (MIL/uL)    Hemoglobin 15.3 (*) 12.0 - 15.0 (g/dL)    HCT 84.6  96.2 - 95.2 (%)    MCV 84.5  78.0 - 100.0 (fL)    MCH 29.7  26.0 - 34.0 (pg)    MCHC 35.1  30.0 - 36.0 (g/dL)    RDW 84.1  32.4 - 40.1 (%)    Platelets 295  150 - 400 (K/uL)   DIFFERENTIAL     Status: Abnormal   Collection Time   10/30/11  3:45 PM      Component Value Range Comment   Neutrophils Relative 87 (*) 43 - 77 (%)    Neutro Abs 12.1 (*) 1.7 - 7.7 (K/uL)    Lymphocytes Relative 8 (*) 12 - 46 (%)    Lymphs Abs 1.1  0.7 - 4.0 (K/uL)    Monocytes  Relative 5  3 - 12 (%)    Monocytes Absolute 0.7  0.1 - 1.0 (K/uL)    Eosinophils Relative 0  0 - 5 (%)    Eosinophils Absolute 0.0  0.0 - 0.7 (K/uL)    Basophils Relative 0  0 - 1 (%)    Basophils Absolute 0.0  0.0 - 0.1 (K/uL)   COMPREHENSIVE METABOLIC PANEL     Status: Abnormal   Collection Time   10/30/11  3:45 PM      Component Value Range Comment   Sodium 129 (*) 135 - 145 (mEq/L) REPEATED TO VERIFY   Potassium 4.9  3.5 - 5.1 (mEq/L)    Chloride 90 (*) 96 - 112 (mEq/L) REPEATED TO VERIFY   CO2 12 (*) 19 - 32 (mEq/L) REPEATED TO VERIFY   Glucose, Bld 453 (*) 70 - 99 (mg/dL)    BUN 27 (*) 6 - 23 (mg/dL)    Creatinine, Ser 1.61  0.50 - 1.10 (mg/dL)    Calcium 09.6  8.4 - 10.5 (mg/dL)    Total Protein 8.5 (*) 6.0 - 8.3 (g/dL)    Albumin 4.4  3.5 - 5.2 (g/dL)    AST 12  0 - 37 (U/L)    ALT 11  0 - 35 (U/L)    Alkaline Phosphatase 104  39 - 117 (U/L)    Total Bilirubin 0.5  0.3 - 1.2 (mg/dL)    GFR calc non Af Amer >90  >90 (mL/min)    GFR calc Af Amer >90  >90 (mL/min)   LIPASE, BLOOD     Status: Normal   Collection Time   10/30/11  3:45 PM      Component Value Range Comment   Lipase 18  11 - 59 (U/L)   URINALYSIS, ROUTINE W REFLEX  MICROSCOPIC     Status: Abnormal   Collection Time   10/30/11  4:54 PM      Component Value Range Comment   Color, Urine YELLOW  YELLOW     APPearance CLOUDY (*) CLEAR     Specific Gravity, Urine 1.030  1.005 - 1.030     pH 5.0  5.0 - 8.0     Glucose, UA >1000 (*) NEGATIVE (mg/dL)    Hgb urine dipstick LARGE (*) NEGATIVE     Bilirubin Urine NEGATIVE  NEGATIVE     Ketones, ur >80 (*) NEGATIVE (mg/dL)    Protein, ur NEGATIVE  NEGATIVE (mg/dL)    Urobilinogen, UA 0.2  0.0 - 1.0 (mg/dL)    Nitrite NEGATIVE  NEGATIVE     Leukocytes, UA SMALL (*) NEGATIVE    URINE MICROSCOPIC-ADD ON     Status: Normal   Collection Time   10/30/11  4:54 PM      Component Value Range Comment   Squamous Epithelial / LPF RARE  RARE     WBC, UA 21-50  <3 (WBC/hpf)    RBC / HPF 0-2  <3 (RBC/hpf)   GLUCOSE, CAPILLARY     Status: Abnormal   Collection Time   10/30/11  5:04 PM      Component Value Range Comment   Glucose-Capillary 408 (*) 70 - 99 (mg/dL)    Comment 1 Documented in Chart      Comment 2 Notify RN     POCT PREGNANCY, URINE     Status: Normal   Collection Time   10/30/11  5:08 PM      Component Value Range Comment   Preg Test, Ur NEGATIVE  BASIC METABOLIC PANEL     Status: Abnormal   Collection Time   10/30/11  7:40 PM      Component Value Range Comment   Sodium 133 (*) 135 - 145 (mEq/L)    Potassium 4.8  3.5 - 5.1 (mEq/L)    Chloride 100  96 - 112 (mEq/L) DELTA CHECK NOTED   CO2 11 (*) 19 - 32 (mEq/L)    Glucose, Bld 328 (*) 70 - 99 (mg/dL)    BUN 22  6 - 23 (mg/dL)    Creatinine, Ser 1.61  0.50 - 1.10 (mg/dL)    Calcium 8.9  8.4 - 10.5 (mg/dL)    GFR calc non Af Amer >90  >90 (mL/min)    GFR calc Af Amer >90  >90 (mL/min)   CBC     Status: Abnormal   Collection Time   10/30/11  7:40 PM      Component Value Range Comment   WBC 14.7 (*) 4.0 - 10.5 (K/uL)    RBC 4.58  3.87 - 5.11 (MIL/uL)    Hemoglobin 13.6  12.0 - 15.0 (g/dL)    HCT 09.6  04.5 - 40.9 (%)    MCV 84.7  78.0 - 100.0  (fL)    MCH 29.7  26.0 - 34.0 (pg)    MCHC 35.1  30.0 - 36.0 (g/dL)    RDW 81.1  91.4 - 78.2 (%)    Platelets 283  150 - 400 (K/uL)   GLUCOSE, CAPILLARY     Status: Abnormal   Collection Time   10/30/11  8:03 PM      Component Value Range Comment   Glucose-Capillary 296 (*) 70 - 99 (mg/dL)   GLUCOSE, CAPILLARY     Status: Abnormal   Collection Time   10/30/11 10:24 PM      Component Value Range Comment   Glucose-Capillary 277 (*) 70 - 99 (mg/dL)   BASIC METABOLIC PANEL     Status: Abnormal   Collection Time   10/30/11 11:00 PM      Component Value Range Comment   Sodium 134 (*) 135 - 145 (mEq/L)    Potassium 3.8  3.5 - 5.1 (mEq/L) DELTA CHECK NOTED   Chloride 107  96 - 112 (mEq/L)    CO2 12 (*) 19 - 32 (mEq/L)    Glucose, Bld 191 (*) 70 - 99 (mg/dL)    BUN 19  6 - 23 (mg/dL)    Creatinine, Ser 9.56  0.50 - 1.10 (mg/dL)    Calcium 7.9 (*) 8.4 - 10.5 (mg/dL)    GFR calc non Af Amer >90  >90 (mL/min)    GFR calc Af Amer >90  >90 (mL/min)   CBC     Status: Abnormal   Collection Time   10/30/11 11:00 PM      Component Value Range Comment   WBC 14.1 (*) 4.0 - 10.5 (K/uL)    RBC 4.29  3.87 - 5.11 (MIL/uL)    Hemoglobin 12.4  12.0 - 15.0 (g/dL)    HCT 21.3  08.6 - 57.8 (%)    MCV 84.4  78.0 - 100.0 (fL)    MCH 28.9  26.0 - 34.0 (pg)    MCHC 34.3  30.0 - 36.0 (g/dL)    RDW 46.9  62.9 - 52.8 (%)    Platelets 280  150 - 400 (K/uL)   MRSA PCR SCREENING     Status: Normal   Collection Time   10/30/11 11:46 PM  Component Value Range Comment   MRSA by PCR NEGATIVE  NEGATIVE    GLUCOSE, CAPILLARY     Status: Abnormal   Collection Time   10/31/11 12:19 AM      Component Value Range Comment   Glucose-Capillary 130 (*) 70 - 99 (mg/dL)   GLUCOSE, CAPILLARY     Status: Abnormal   Collection Time   10/31/11  1:05 AM      Component Value Range Comment   Glucose-Capillary 134 (*) 70 - 99 (mg/dL)   GLUCOSE, CAPILLARY     Status: Abnormal   Collection Time   10/31/11  2:07 AM       Component Value Range Comment   Glucose-Capillary 125 (*) 70 - 99 (mg/dL)   GLUCOSE, CAPILLARY     Status: Abnormal   Collection Time   10/31/11  3:04 AM      Component Value Range Comment   Glucose-Capillary 125 (*) 70 - 99 (mg/dL)   BASIC METABOLIC PANEL     Status: Abnormal   Collection Time   10/31/11  3:55 AM      Component Value Range Comment   Sodium 133 (*) 135 - 145 (mEq/L)    Potassium 4.9  3.5 - 5.1 (mEq/L)    Chloride 107  96 - 112 (mEq/L)    CO2 16 (*) 19 - 32 (mEq/L)    Glucose, Bld 147 (*) 70 - 99 (mg/dL)    BUN 17  6 - 23 (mg/dL)    Creatinine, Ser 1.61  0.50 - 1.10 (mg/dL)    Calcium 7.9 (*) 8.4 - 10.5 (mg/dL)    GFR calc non Af Amer >90  >90 (mL/min)    GFR calc Af Amer >90  >90 (mL/min)   GLUCOSE, CAPILLARY     Status: Abnormal   Collection Time   10/31/11  4:07 AM      Component Value Range Comment   Glucose-Capillary 142 (*) 70 - 99 (mg/dL)   GLUCOSE, CAPILLARY     Status: Abnormal   Collection Time   10/31/11  5:11 AM      Component Value Range Comment   Glucose-Capillary 123 (*) 70 - 99 (mg/dL)   GLUCOSE, CAPILLARY     Status: Abnormal   Collection Time   10/31/11  6:10 AM      Component Value Range Comment   Glucose-Capillary 118 (*) 70 - 99 (mg/dL)   GLUCOSE, CAPILLARY     Status: Abnormal   Collection Time   10/31/11  7:13 AM      Component Value Range Comment   Glucose-Capillary 125 (*) 70 - 99 (mg/dL)     Micro Results: Recent Results (from the past 240 hour(s))  MRSA PCR SCREENING     Status: Normal   Collection Time   10/30/11 11:46 PM      Component Value Range Status Comment   MRSA by PCR NEGATIVE  NEGATIVE  Final     Studies/Results: Dg Chest 2 View  10/02/2011  *RADIOLOGY REPORT*  Clinical Data: Fever and congestion.  CHEST - 2 VIEW  Comparison: Chest x-ray 06/11/2011.  Findings: The cardiac silhouette, mediastinal and hilar contours are within normal limits.  There is mild hyperinflation and mild peribronchial thickening which may  suggest reactive airways disease or bronchitis.  No focal infiltrate or effusion.  The bony thorax is intact.  IMPRESSION: Peribronchial thickening and hyperinflation suggesting bronchitis. No focal infiltrates.  Original Report Authenticated By: P. Loralie Champagne, M.D.   Dg Abd  Acute W/chest  10/30/2011  *RADIOLOGY REPORT*  Clinical Data: Rule out small bowel obstruction, free air, pneumonia.  ACUTE ABDOMEN SERIES (ABDOMEN 2 VIEW & CHEST 1 VIEW)  Comparison: 10/02/2011  Findings: Lungs are hyperinflated.  There is perihilar peribronchial thickening.  The heart size is normal.  There are no focal consolidations or pleural effusions.  No edema.  Supine and erect views of the abdomen show a nonobstructive bowel gas pattern.  There is moderate stool within the transverse, descending, and sigmoid colon.  There is moderate air fluid level within the stomach.  IMPRESSION:  1.  Hyperinflation and bronchitic changes in the lungs. 2.  Nonobstructive bowel gas pattern.  Original Report Authenticated By: Patterson Hammersmith, M.D.   Medications: Scheduled Meds:   . ciprofloxacin  400 mg Intravenous Once  . ciprofloxacin  400 mg Intravenous Q12H  . enoxaparin  40 mg Subcutaneous Q24H  . insulin aspart  0-5 Units Subcutaneous QHS  . insulin aspart  0-9 Units Subcutaneous TID WC  . insulin glargine  40 Units Subcutaneous QHS  . nicotine  14 mg Transdermal Daily  . potassium chloride  10 mEq Intravenous Q1H  . potassium chloride  10 mEq Intravenous Q1H  . sodium chloride  2,000 mL Intravenous Once  . DISCONTD: insulin glargine  20 Units Subcutaneous QHS  . DISCONTD: ondansetron      . DISCONTD: ondansetron (ZOFRAN) IV  4 mg Intravenous Q6H   Continuous Infusions:   . sodium chloride    . sodium chloride    . sodium chloride    . DISCONTD: sodium chloride 150 mL/hr at 10/30/11 1753  . DISCONTD: sodium chloride    . DISCONTD: sodium chloride    . DISCONTD: dextrose 5 % and 0.45% NaCl    . DISCONTD:  dextrose 5 % and 0.45% NaCl 75 mL/hr at 10/31/11 0200  . DISCONTD: insulin (NOVOLIN-R) infusion    . DISCONTD: insulin (NOVOLIN-R) infusion 0.7 Units/hr (10/31/11 0100)   PRN Meds:.dextrose, ondansetron, DISCONTD: dextrose, DISCONTD: ondansetron  Assessment/Plan: #1 DKA-resolved. We'll adjust the dose of Lantus insulin. We order  hemoglobin A1c. #2 Dehydration-continue hydration with normal saline. #3 Leukocytosis, questionable UTI-start IV Cipro Patient is clinically stable, transferred to general medical floor.   LOS: 1 day   Analea Muller 10/31/2011, 7:44 AM

## 2011-10-31 NOTE — Plan of Care (Signed)
Problem: Phase I Progression Outcomes Goal: Acidosis resolving

## 2011-11-01 LAB — COMPREHENSIVE METABOLIC PANEL
AST: 13 U/L (ref 0–37)
Albumin: 2.7 g/dL — ABNORMAL LOW (ref 3.5–5.2)
Chloride: 106 mEq/L (ref 96–112)
Creatinine, Ser: 0.5 mg/dL (ref 0.50–1.10)
Total Bilirubin: 0.2 mg/dL — ABNORMAL LOW (ref 0.3–1.2)

## 2011-11-01 LAB — GLUCOSE, CAPILLARY
Glucose-Capillary: 72 mg/dL (ref 70–99)
Glucose-Capillary: 161 mg/dL — ABNORMAL HIGH (ref 70–99)

## 2011-11-01 LAB — CBC
HCT: 35.5 % — ABNORMAL LOW (ref 36.0–46.0)
MCHC: 34.6 g/dL (ref 30.0–36.0)
RDW: 15.1 % (ref 11.5–15.5)

## 2011-11-01 LAB — DIFFERENTIAL
Basophils Absolute: 0 10*3/uL (ref 0.0–0.1)
Basophils Relative: 0 % (ref 0–1)
Monocytes Absolute: 0.8 10*3/uL (ref 0.1–1.0)
Neutro Abs: 4.5 10*3/uL (ref 1.7–7.7)
Neutrophils Relative %: 50 % (ref 43–77)

## 2011-11-01 MED ORDER — INSULIN GLARGINE 100 UNIT/ML ~~LOC~~ SOLN
45.0000 [IU] | Freq: Every day | SUBCUTANEOUS | Status: DC
  Administered 2011-11-01: 45 [IU] via SUBCUTANEOUS

## 2011-11-01 NOTE — Progress Notes (Signed)
Patient ID: [name], female   DOB: 06/04/1986, 26 y.o.   MRN: [id number] Patient ID: [name], female   DOB: [birth year], 26 y.o.   MRN: [id number] Subjective: Patient seen.Feels better. Denies any complaints.  Objective: Weight change:   Intake/Output Summary (Last 24 hours) at 11/01/11 0915 Last data filed at 11/01/11 0700  Gross per 24 hour  Intake   2440 ml  Output      0 ml  Net   2440 ml   BP 113/67  Pulse 73  Temp(Src) 97.7 F (36.5 C) (Oral)  Resp 18  Ht 5' (1.524 m)  Wt 43 kg (94 lb 12.8 oz)  BMI 18.51 kg/m2  SpO2 96%  LMP 09/25/2011 Physical Exam: General appearance: alert, cooperative and no distress, Head: Normocephalic, without obvious abnormality, atraumatic Neck: no adenopathy, no carotid bruit, no JVD, supple, symmetrical, trachea midline and thyroid not enlarged, symmetric, no tenderness/mass/nodules Lungs: clear to auscultation bilaterally Heart: regular rate and rhythm, S1, S2 normal, no murmur, click, rub or gallop Abdomen: soft, suprapubic tenderness; bowel sounds normal; no masses,  no organomegaly Extremities: extremities normal, atraumatic, no cyanosis or edema Skin: Improved turgor.  Lab Results: Results for orders placed during the hospital encounter of 10/30/11 (from the past 48 hour(s))  GLUCOSE, CAPILLARY     Status: Abnormal   Collection Time   10/30/11  2:51 PM      Component Value Range Comment   Glucose-Capillary 482 (*) 70 - 99 (mg/dL)    Comment 1 Notify RN     BLOOD GAS, ARTERIAL     Status: Abnormal   Collection Time   10/30/11  3:33 PM      Component Value Range Comment   FIO2 0.21      pH, Arterial 7.289 (*) 7.350 - 7.400     pCO2 arterial 26.4 (*) 35.0 - 45.0 (mmHg)    pO2, Arterial 109.0 (*) 80.0 - 100.0 (mmHg)    Bicarbonate 12.3 (*) 20.0 - 24.0 (mEq/L)    TCO2 11.1  0 - 100 (mmol/L)    Acid-base deficit 12.6 (*) 0.0 - 2.0 (mmol/L)    O2 Saturation 97.1      Patient temperature 98.6      Collection site LEFT  RADIAL      Drawn by [phone]      Sample type ARTERIAL DRAW      Allens test (pass/fail) PASS  PASS    CBC     Status: Abnormal   Collection Time   10/30/11  3:45 PM      Component Value Range Comment   WBC 13.9 (*) 4.0 - 10.5 (K/uL)    RBC 5.16 (*) 3.87 - 5.11 (MIL/uL)    Hemoglobin 15.3 (*) 12.0 - 15.0 (g/dL)    HCT 62.1  30.8 - 65.7 (%)    MCV 84.5  78.0 - 100.0 (fL)    MCH 29.7  26.0 - 34.0 (pg)    MCHC 35.1  30.0 - 36.0 (g/dL)    RDW 84.6  96.2 - 95.2 (%)    Platelets 295  150 - 400 (K/uL)   DIFFERENTIAL     Status: Abnormal   Collection Time   10/30/11  3:45 PM      Component Value Range Comment   Neutrophils Relative 87 (*) 43 - 77 (%)    Neutro Abs 12.1 (*) 1.7 - 7.7 (K/uL)    Lymphocytes Relative 8 (*) 12 - 46 (%)    Lymphs Abs 1.1  0.7 - 4.0 (K/uL)    Monocytes Relative 5  3 - 12 (%)    Monocytes Absolute 0.7  0.1 - 1.0 (K/uL)    Eosinophils Relative 0  0 - 5 (%)    Eosinophils Absolute 0.0  0.0 - 0.7 (K/uL)    Basophils Relative 0  0 - 1 (%)    Basophils Absolute 0.0  0.0 - 0.1 (K/uL)   COMPREHENSIVE METABOLIC PANEL     Status: Abnormal   Collection Time   10/30/11  3:45 PM      Component Value Range Comment   Sodium 129 (*) 135 - 145 (mEq/L) REPEATED TO VERIFY   Potassium 4.9  3.5 - 5.1 (mEq/L)    Chloride 90 (*) 96 - 112 (mEq/L) REPEATED TO VERIFY   CO2 12 (*) 19 - 32 (mEq/L) REPEATED TO VERIFY   Glucose, Bld 453 (*) 70 - 99 (mg/dL)    BUN 27 (*) 6 - 23 (mg/dL)    Creatinine, Ser 4.09  0.50 - 1.10 (mg/dL)    Calcium 81.1  8.4 - 10.5 (mg/dL)    Total Protein 8.5 (*) 6.0 - 8.3 (g/dL)    Albumin 4.4  3.5 - 5.2 (g/dL)    AST 12  0 - 37 (U/L)    ALT 11  0 - 35 (U/L)    Alkaline Phosphatase 104  39 - 117 (U/L)    Total Bilirubin 0.5  0.3 - 1.2 (mg/dL)    GFR calc non Af Amer >90  >90 (mL/min)    GFR calc Af Amer >90  >90 (mL/min)   LIPASE, BLOOD     Status: Normal   Collection Time   10/30/11  3:45 PM      Component Value Range Comment   Lipase 18  11 - 59  (U/L)   URINALYSIS, ROUTINE W REFLEX MICROSCOPIC     Status: Abnormal   Collection Time   10/30/11  4:54 PM      Component Value Range Comment   Color, Urine YELLOW  YELLOW     APPearance CLOUDY (*) CLEAR     Specific Gravity, Urine 1.030  1.005 - 1.030     pH 5.0  5.0 - 8.0     Glucose, UA >1000 (*) NEGATIVE (mg/dL)    Hgb urine dipstick LARGE (*) NEGATIVE     Bilirubin Urine NEGATIVE  NEGATIVE     Ketones, ur >80 (*) NEGATIVE (mg/dL)    Protein, ur NEGATIVE  NEGATIVE (mg/dL)    Urobilinogen, UA 0.2  0.0 - 1.0 (mg/dL)    Nitrite NEGATIVE  NEGATIVE     Leukocytes, UA SMALL (*) NEGATIVE    URINE CULTURE     Status: Normal   Collection Time   10/30/11  4:54 PM      Component Value Range Comment   Specimen Description URINE, CLEAN CATCH      Special Requests NONE      Setup Time [id number]      Colony Count 40,000 COLONIES/ML      Culture        Value: Multiple bacterial morphotypes present, none predominant. Suggest appropriate recollection if clinically indicated.   Report Status 11/01/2011 FINAL     URINE MICROSCOPIC-ADD ON     Status: Normal   Collection Time   10/30/11  4:54 PM      Component Value Range Comment   Squamous Epithelial / LPF RARE  RARE     WBC, UA 21-50  <3 (WBC/hpf)  RBC / HPF 0-2  <3 (RBC/hpf)   GLUCOSE, CAPILLARY     Status: Abnormal   Collection Time   10/30/11  5:04 PM      Component Value Range Comment   Glucose-Capillary 408 (*) 70 - 99 (mg/dL)    Comment 1 Documented in Chart      Comment 2 Notify RN     POCT PREGNANCY, URINE     Status: Normal   Collection Time   10/30/11  5:08 PM      Component Value Range Comment   Preg Test, Ur NEGATIVE     BASIC METABOLIC PANEL     Status: Abnormal   Collection Time   10/30/11  7:40 PM      Component Value Range Comment   Sodium 133 (*) 135 - 145 (mEq/L)    Potassium 4.8  3.5 - 5.1 (mEq/L)    Chloride 100  96 - 112 (mEq/L) DELTA CHECK NOTED   CO2 11 (*) 19 - 32 (mEq/L)    Glucose, Bld 328 (*) 70 - 99  (mg/dL)    BUN 22  6 - 23 (mg/dL)    Creatinine, Ser 1.61  0.50 - 1.10 (mg/dL)    Calcium 8.9  8.4 - 10.5 (mg/dL)    GFR calc non Af Amer >90  >90 (mL/min)    GFR calc Af Amer >90  >90 (mL/min)   CBC     Status: Abnormal   Collection Time   10/30/11  7:40 PM      Component Value Range Comment   WBC 14.7 (*) 4.0 - 10.5 (K/uL)    RBC 4.58  3.87 - 5.11 (MIL/uL)    Hemoglobin 13.6  12.0 - 15.0 (g/dL)    HCT 09.6  04.5 - 40.9 (%)    MCV 84.7  78.0 - 100.0 (fL)    MCH 29.7  26.0 - 34.0 (pg)    MCHC 35.1  30.0 - 36.0 (g/dL)    RDW 81.1  91.4 - 78.2 (%)    Platelets 283  150 - 400 (K/uL)   GLUCOSE, CAPILLARY     Status: Abnormal   Collection Time   10/30/11  8:03 PM      Component Value Range Comment   Glucose-Capillary 296 (*) 70 - 99 (mg/dL)   GLUCOSE, CAPILLARY     Status: Abnormal   Collection Time   10/30/11 10:24 PM      Component Value Range Comment   Glucose-Capillary 277 (*) 70 - 99 (mg/dL)   BASIC METABOLIC PANEL     Status: Abnormal   Collection Time   10/30/11 11:00 PM      Component Value Range Comment   Sodium 134 (*) 135 - 145 (mEq/L)    Potassium 3.8  3.5 - 5.1 (mEq/L) DELTA CHECK NOTED   Chloride 107  96 - 112 (mEq/L)    CO2 12 (*) 19 - 32 (mEq/L)    Glucose, Bld 191 (*) 70 - 99 (mg/dL)    BUN 19  6 - 23 (mg/dL)    Creatinine, Ser 9.56  0.50 - 1.10 (mg/dL)    Calcium 7.9 (*) 8.4 - 10.5 (mg/dL)    GFR calc non Af Amer >90  >90 (mL/min)    GFR calc Af Amer >90  >90 (mL/min)   CBC     Status: Abnormal   Collection Time   10/30/11 11:00 PM      Component Value Range Comment   WBC 14.1 (*) 4.0 -  10.5 (K/uL)    RBC 4.29  3.87 - 5.11 (MIL/uL)    Hemoglobin 12.4  12.0 - 15.0 (g/dL)    HCT 40.9  81.1 - 91.4 (%)    MCV 84.4  78.0 - 100.0 (fL)    MCH 28.9  26.0 - 34.0 (pg)    MCHC 34.3  30.0 - 36.0 (g/dL)    RDW 78.2  95.6 - 21.3 (%)    Platelets 280  150 - 400 (K/uL)   MRSA PCR SCREENING     Status: Normal   Collection Time   10/30/11 11:46 PM      Component Value  Range Comment   MRSA by PCR NEGATIVE  NEGATIVE    GLUCOSE, CAPILLARY     Status: Abnormal   Collection Time   10/31/11 12:19 AM      Component Value Range Comment   Glucose-Capillary 130 (*) 70 - 99 (mg/dL)   GLUCOSE, CAPILLARY     Status: Abnormal   Collection Time   10/31/11  1:05 AM      Component Value Range Comment   Glucose-Capillary 134 (*) 70 - 99 (mg/dL)   GLUCOSE, CAPILLARY     Status: Abnormal   Collection Time   10/31/11  2:07 AM      Component Value Range Comment   Glucose-Capillary 125 (*) 70 - 99 (mg/dL)   GLUCOSE, CAPILLARY     Status: Abnormal   Collection Time   10/31/11  3:04 AM      Component Value Range Comment   Glucose-Capillary 125 (*) 70 - 99 (mg/dL)   BASIC METABOLIC PANEL     Status: Abnormal   Collection Time   10/31/11  3:55 AM      Component Value Range Comment   Sodium 133 (*) 135 - 145 (mEq/L)    Potassium 4.9  3.5 - 5.1 (mEq/L)    Chloride 107  96 - 112 (mEq/L)    CO2 16 (*) 19 - 32 (mEq/L)    Glucose, Bld 147 (*) 70 - 99 (mg/dL)    BUN 17  6 - 23 (mg/dL)    Creatinine, Ser 0.86  0.50 - 1.10 (mg/dL)    Calcium 7.9 (*) 8.4 - 10.5 (mg/dL)    GFR calc non Af Amer >90  >90 (mL/min)    GFR calc Af Amer >90  >90 (mL/min)   GLUCOSE, CAPILLARY     Status: Abnormal   Collection Time   10/31/11  4:07 AM      Component Value Range Comment   Glucose-Capillary 142 (*) 70 - 99 (mg/dL)   GLUCOSE, CAPILLARY     Status: Abnormal   Collection Time   10/31/11  5:11 AM      Component Value Range Comment   Glucose-Capillary 123 (*) 70 - 99 (mg/dL)   GLUCOSE, CAPILLARY     Status: Abnormal   Collection Time   10/31/11  6:10 AM      Component Value Range Comment   Glucose-Capillary 118 (*) 70 - 99 (mg/dL)   GLUCOSE, CAPILLARY     Status: Abnormal   Collection Time   10/31/11  7:13 AM      Component Value Range Comment   Glucose-Capillary 125 (*) 70 - 99 (mg/dL)   HEMOGLOBIN V7Q     Status: Abnormal   Collection Time   10/31/11  8:13 AM      Component Value  Range Comment   Hemoglobin A1C 11.3 (*) <5.7 (%)    Mean  Plasma Glucose 278 (*) <117 (mg/dL)   GLUCOSE, CAPILLARY     Status: Abnormal   Collection Time   10/31/11 11:43 AM      Component Value Range Comment   Glucose-Capillary 210 (*) 70 - 99 (mg/dL)    Comment 1 Documented in Chart      Comment 2 Notify RN     GLUCOSE, CAPILLARY     Status: Abnormal   Collection Time   10/31/11  4:18 PM      Component Value Range Comment   Glucose-Capillary 321 (*) 70 - 99 (mg/dL)    Comment 1 Notify RN     GLUCOSE, CAPILLARY     Status: Abnormal   Collection Time   10/31/11  8:49 PM      Component Value Range Comment   Glucose-Capillary 317 (*) 70 - 99 (mg/dL)    Comment 1 Notify RN     CBC     Status: Abnormal   Collection Time   11/01/11  3:35 AM      Component Value Range Comment   WBC 9.0  4.0 - 10.5 (K/uL)    RBC 4.24  3.87 - 5.11 (MIL/uL)    Hemoglobin 12.3  12.0 - 15.0 (g/dL)    HCT 16.1 (*) 09.6 - 46.0 (%)    MCV 83.7  78.0 - 100.0 (fL)    MCH 29.0  26.0 - 34.0 (pg)    MCHC 34.6  30.0 - 36.0 (g/dL)    RDW 04.5  40.9 - 81.1 (%)    Platelets 247  150 - 400 (K/uL)   DIFFERENTIAL     Status: Normal   Collection Time   11/01/11  3:35 AM      Component Value Range Comment   Neutrophils Relative 50  43 - 77 (%)    Neutro Abs 4.5  1.7 - 7.7 (K/uL)    Lymphocytes Relative 40  12 - 46 (%)    Lymphs Abs 3.6  0.7 - 4.0 (K/uL)    Monocytes Relative 9  3 - 12 (%)    Monocytes Absolute 0.8  0.1 - 1.0 (K/uL)    Eosinophils Relative 1  0 - 5 (%)    Eosinophils Absolute 0.1  0.0 - 0.7 (K/uL)    Basophils Relative 0  0 - 1 (%)    Basophils Absolute 0.0  0.0 - 0.1 (K/uL)   COMPREHENSIVE METABOLIC PANEL     Status: Abnormal   Collection Time   11/01/11  3:35 AM      Component Value Range Comment   Sodium 140  135 - 145 (mEq/L)    Potassium 3.5  3.5 - 5.1 (mEq/L)    Chloride 106  96 - 112 (mEq/L)    CO2 27  19 - 32 (mEq/L)    Glucose, Bld 53 (*) 70 - 99 (mg/dL)    BUN 12  6 - 23 (mg/dL)     Creatinine, Ser 9.14  0.50 - 1.10 (mg/dL)    Calcium 8.3 (*) 8.4 - 10.5 (mg/dL)    Total Protein 5.7 (*) 6.0 - 8.3 (g/dL)    Albumin 2.7 (*) 3.5 - 5.2 (g/dL)    AST 13  0 - 37 (U/L)    ALT 9  0 - 35 (U/L)    Alkaline Phosphatase 68  39 - 117 (U/L)    Total Bilirubin 0.2 (*) 0.3 - 1.2 (mg/dL)    GFR calc non Af Amer >90  >90 (mL/min)  GFR calc Af Amer >90  >90 (mL/min)   GLUCOSE, CAPILLARY     Status: Normal   Collection Time   11/01/11  4:48 AM      Component Value Range Comment   Glucose-Capillary 72  70 - 99 (mg/dL)     Micro Results: Recent Results (from the past 240 hour(s))  URINE CULTURE     Status: Normal   Collection Time   10/30/11  4:54 PM      Component Value Range Status Comment   Specimen Description URINE, CLEAN CATCH   Final    Special Requests NONE   Final    Setup Time [id number]   Final    Colony Count 40,000 COLONIES/ML   Final    Culture     Final    Value: Multiple bacterial morphotypes present, none predominant. Suggest appropriate recollection if clinically indicated.   Report Status 11/01/2011 FINAL   Final   MRSA PCR SCREENING     Status: Normal   Collection Time   10/30/11 11:46 PM      Component Value Range Status Comment   MRSA by PCR NEGATIVE  NEGATIVE  Final     Studies/Results: Dg Chest 2 View  10/02/2011  *RADIOLOGY REPORT*  Clinical Data: Fever and congestion.  CHEST - 2 VIEW  Comparison: Chest x-ray 06/11/2011.  Findings: The cardiac silhouette, mediastinal and hilar contours are within normal limits.  There is mild hyperinflation and mild peribronchial thickening which may suggest reactive airways disease or bronchitis.  No focal infiltrate or effusion.  The bony thorax is intact.  IMPRESSION: Peribronchial thickening and hyperinflation suggesting bronchitis. No focal infiltrates.  Original Report Authenticated By: P. Loralie Champagne, M.D.   Dg Abd Acute W/chest  10/30/2011  *RADIOLOGY REPORT*  Clinical Data: Rule out small bowel  obstruction, free air, pneumonia.  ACUTE ABDOMEN SERIES (ABDOMEN 2 VIEW & CHEST 1 VIEW)  Comparison: 10/02/2011  Findings: Lungs are hyperinflated.  There is perihilar peribronchial thickening.  The heart size is normal.  There are no focal consolidations or pleural effusions.  No edema.  Supine and erect views of the abdomen show a nonobstructive bowel gas pattern.  There is moderate stool within the transverse, descending, and sigmoid colon.  There is moderate air fluid level within the stomach.  IMPRESSION:  1.  Hyperinflation and bronchitic changes in the lungs. 2.  Nonobstructive bowel gas pattern.  Original Report Authenticated By: Patterson Hammersmith, M.D.   Medications: Scheduled Meds:    . ciprofloxacin  400 mg Intravenous Once  . ciprofloxacin  400 mg Intravenous Q12H  . enoxaparin  40 mg Subcutaneous Q24H  . insulin aspart  0-5 Units Subcutaneous QHS  . insulin aspart  0-9 Units Subcutaneous TID WC  . insulin glargine  40 Units Subcutaneous QHS  . nicotine  14 mg Transdermal Daily  . DISCONTD: insulin aspart  2-4 Units Subcutaneous TID AC   Continuous Infusions:    . sodium chloride 1,000 mL (11/01/11 0839)   PRN Meds:.dextrose, ibuprofen, ondansetron  Assessment/Plan: #1 DKA-resolved. Adjust Lantus insulin #2 Dehydration-continue hydration with normal saline. #3 UTI-continue IV Cipro Patient will need diabetic education. For discharge on 09/01/2012   LOS: 2 days   Lenaya Pietsch 11/01/2011, 9:14 AM

## 2011-11-01 NOTE — Progress Notes (Signed)
Inpatient Diabetes Program Recommendations  AACE/ADA: New Consensus Statement on Inpatient Glycemic Control (2009)  Target Ranges:  Prepandial:   less than 140 mg/dL      Peak postprandial:   less than 180 mg/dL (1-2 hours)      Critically ill patients:  140 - 180 mg/dL   Reason for Visit:   Results for STEVI, [name] (MRN [id number]) as of 11/01/2011 09:22  Ref. Range 10/31/2011 07:13 10/31/2011 11:43 10/31/2011 16:18 10/31/2011 20:49 11/01/2011 04:48  Glucose-Capillary Latest Range: 70-99 mg/dL 782 (H) 956 (H) 213 (H) 317 (H) 72    Inpatient Diabetes Program Recommendations Insulin - Meal Coverage: Add Novolog 3 units tidwc for meal coverage insulin  Note: Pt states she checks blood sugars at home and usually run in 100s.  Sees Dr. Gilmore Laroche for diabetes management every 3 months.  Discussed high HgbA1C and states she will followup with MD.  Explained importance of taking insulin, even if she is sick, and then drinking CHO beverages until po increases, to avoid DKA.  Verbalized understanding.  Ailene Ards, RD, LDN, CDE In-patient Diabetes Coordinator

## 2011-11-02 LAB — COMPREHENSIVE METABOLIC PANEL
ALT: 11 U/L (ref 0–35)
Albumin: 3 g/dL — ABNORMAL LOW (ref 3.5–5.2)
Alkaline Phosphatase: 71 U/L (ref 39–117)
Calcium: 9 mg/dL (ref 8.4–10.5)
GFR calc Af Amer: >90 mL/min (ref >90)
Glucose, Bld: 47 mg/dL — ABNORMAL LOW (ref 70–99)
Potassium: 3.2 mEq/L — ABNORMAL LOW (ref 3.5–5.1)
Sodium: 139 mEq/L (ref 135–145)
Total Protein: 6.3 g/dL (ref 6.0–8.3)

## 2011-11-02 LAB — DIFFERENTIAL
Basophils Absolute: 0 10*3/uL (ref 0.0–0.1)
Basophils Relative: 0 % (ref 0–1)
Eosinophils Absolute: 0.2 10*3/uL (ref 0.0–0.7)
Eosinophils Relative: 2 % (ref 0–5)
Lymphs Abs: 5 10*3/uL — ABNORMAL HIGH (ref 0.7–4.0)
Neutrophils Relative %: 44 % (ref 43–77)

## 2011-11-02 LAB — CBC
MCH: 28.4 pg (ref 26.0–34.0)
MCHC: 34 g/dL (ref 30.0–36.0)
MCV: 83.4 fL (ref 78.0–100.0)
Platelets: 279 10*3/uL (ref 150–400)
RBC: 4.47 MIL/uL (ref 3.87–5.11)
RDW: 15 % (ref 11.5–15.5)

## 2011-11-02 LAB — GLUCOSE, CAPILLARY
Glucose-Capillary: 351 mg/dL — ABNORMAL HIGH (ref 70–99)
Glucose-Capillary: 60 mg/dL — ABNORMAL LOW (ref 70–99)
Glucose-Capillary: 136 mg/dL — ABNORMAL HIGH (ref 70–99)

## 2011-11-02 MED ORDER — CIPROFLOXACIN HCL 500 MG PO TABS: 500.0000 mg | ORAL_TABLET | Freq: Two times a day (BID) | ORAL | Status: AC

## 2011-11-02 MED ORDER — NICOTINE 14 MG/24HR TD PT24: 1.0000 | MEDICATED_PATCH | Freq: Every day | TRANSDERMAL | Status: AC

## 2011-11-02 MED ORDER — INSULIN GLARGINE 100 UNIT/ML ~~LOC~~ SOLN: 45.0000 [IU] | Freq: Every day | SUBCUTANEOUS | Status: DC

## 2011-11-02 NOTE — Discharge Summary (Signed)
DISCHARGE SUMMARY  [name]  MR#: [id number]  DOB:May 10, 1986  Date of Admission: 10/30/2011 Date of Discharge: 11/02/2011  Attending Physician:Skip Litke  Patient's WUJ:WJXBJY,NWGN BETH, PA, PA  Consults:   Discharge Diagnoses: #1 diabetic ketoacidosis-resolved #2 dehydration #3 gastroenteritis #4 UTI #5 chronic tobacco use #6 noncompliant with medication.    Current Discharge Medication List    START taking these medications   Details  ciprofloxacin (CIPRO) 500 MG tablet Take 1 tablet (500 mg total) by mouth 2 (two) times daily. Qty: 14 tablet, Refills: 0    nicotine (NICODERM CQ - DOSED IN MG/24 HOURS) 14 mg/24hr patch Place 1 patch onto the skin daily. Qty: 7 patch, Refills: 1      CONTINUE these medications which have CHANGED   Details  insulin glargine (LANTUS) 100 UNIT/ML injection Inject 45 Units into the skin at bedtime. Qty: 10 mL, Refills: 2      CONTINUE these medications which have NOT CHANGED   Details  ibuprofen (ADVIL,MOTRIN) 200 MG tablet Take 600 mg by mouth every 8 (eight) hours as needed. For pain and fever.    insulin aspart (NOVOLOG) 100 UNIT/ML injection Inject 2-4 Units into the skin 3 (three) times daily before meals. Use sliding scale with meals 3 times a day: If 150-250, inject 2 units If 250-350, inject 4 units           Hospital Course: Patient is a 26 year old Caucasian female with history of diabetes mellitus was admitted to the hospital on 10/30/2011 weight 2 days history of generalized abdominal pain with associated nausea and vomiting. No history of fever, chills or Rigors. Patient admitted to close contact with individuals with abdominal discomfort. Patient claimed that during the period she was having repeated episodes of nausea and vomiting, she did not take her insulin. Symptoms persisted hence she presented to the emergency room to be evaluated.    In the emergency room, she was found to be dehydrated and  acidotic with ketonuria. She was admitted to ICU, started on rigorous IV hydration with normal saline as well as glucose stabilizer. Patient was also said to have express her willingness to quit smoking and she was given nicotine patch.     At the time patient was seen by me, acidosis had resolved, patient was however still dehydrated. She complained about abdominal discomfort and examination showed some tenderness in the suprapubic region. A review of patient's lab showed pyuria and subsequently patient was started on IV ciprofloxacin for UTI. She was also continued on IV hydration with normal saline. Since there was no more episode of vomiting, patient was said diabetic ADA diet.    Patient's hemoglobin A1c was found to be  11.3 and subsequently Lantus insulin was adjusted.patient was evaluated by me on daily basis. DVT prophylaxis with Lovenox. Patient is clinically improved. Had diabetic education. Hydration improved markedly. No more episodes  of nausea and vomiting or abdominal discomfort. Examination unremarkable. Vital signs are stable. Plan is for patient to be discharge home today.    Day of Discharge BP 109/71  Pulse 75  Temp(Src) 97.7 F (36.5 C) (Oral)  Resp 18  Ht 5' (1.524 m)  Wt 43 kg (94 lb 12.8 oz)  BMI 18.51 kg/m2  SpO2 99%  LMP 09/25/2011  Physical Exam: Vitals as above. HEENT-mild pallor Neck-no JVD or lymphadenopathy   Chest-clear CVS-S1 and S2 Abdomen-soft, nontender, no organomegaly, bowel sounds positive. Extremities-no pedal edema Skin-normal turgor Neuro-non focal Neuropsych-appropriate affect.   Results for orders placed during the  hospital encounter of 10/30/11 (from the past 24 hour(s))  GLUCOSE, CAPILLARY     Status: Abnormal   Collection Time   11/01/11  4:53 PM      Component Value Range   Glucose-Capillary 237 (*) 70 - 99 (mg/dL)   Comment 1 Notify RN    GLUCOSE, CAPILLARY     Status: Abnormal   Collection Time   11/01/11  9:49 PM      Component  Value Range   Glucose-Capillary 351 (*) 70 - 99 (mg/dL)   Comment 1 Notify RN    CBC     Status: Abnormal   Collection Time   11/02/11  4:19 AM      Component Value Range   WBC 10.7 (*) 4.0 - 10.5 (K/uL)   RBC 4.47  3.87 - 5.11 (MIL/uL)   Hemoglobin 12.7  12.0 - 15.0 (g/dL)   HCT 16.1  09.6 - 04.5 (%)   MCV 83.4  78.0 - 100.0 (fL)   MCH 28.4  26.0 - 34.0 (pg)   MCHC 34.0  30.0 - 36.0 (g/dL)   RDW 40.9  81.1 - 91.4 (%)   Platelets 279  150 - 400 (K/uL)  DIFFERENTIAL     Status: Abnormal   Collection Time   11/02/11  4:19 AM      Component Value Range   Neutrophils Relative 44  43 - 77 (%)   Neutro Abs 4.7  1.7 - 7.7 (K/uL)   Lymphocytes Relative 47 (*) 12 - 46 (%)   Lymphs Abs 5.0 (*) 0.7 - 4.0 (K/uL)   Monocytes Relative 7  3 - 12 (%)   Monocytes Absolute 0.8  0.1 - 1.0 (K/uL)   Eosinophils Relative 2  0 - 5 (%)   Eosinophils Absolute 0.2  0.0 - 0.7 (K/uL)   Basophils Relative 0  0 - 1 (%)   Basophils Absolute 0.0  0.0 - 0.1 (K/uL)  COMPREHENSIVE METABOLIC PANEL     Status: Abnormal   Collection Time   11/02/11  4:19 AM      Component Value Range   Sodium 139  135 - 145 (mEq/L)   Potassium 3.2 (*) 3.5 - 5.1 (mEq/L)   Chloride 101  96 - 112 (mEq/L)   CO2 30  19 - 32 (mEq/L)   Glucose, Bld 47 (*) 70 - 99 (mg/dL)   BUN 11  6 - 23 (mg/dL)   Creatinine, Ser 7.82 (*) 0.50 - 1.10 (mg/dL)   Calcium 9.0  8.4 - 95.6 (mg/dL)   Total Protein 6.3  6.0 - 8.3 (g/dL)   Albumin 3.0 (*) 3.5 - 5.2 (g/dL)   AST 12  0 - 37 (U/L)   ALT 11  0 - 35 (U/L)   Alkaline Phosphatase 71  39 - 117 (U/L)   Total Bilirubin 0.3  0.3 - 1.2 (mg/dL)   GFR calc non Af Amer >90  >90 (mL/min)   GFR calc Af Amer >90  >90 (mL/min)  GLUCOSE, CAPILLARY     Status: Abnormal   Collection Time   11/02/11  4:27 AM      Component Value Range   Glucose-Capillary 60 (*) 70 - 99 (mg/dL)  GLUCOSE, CAPILLARY     Status: Abnormal   Collection Time   11/02/11  5:19 AM      Component Value Range   Glucose-Capillary 136  (*) 70 - 99 (mg/dL)  GLUCOSE, CAPILLARY     Status: Abnormal   Collection Time   11/02/11  7:14 AM      Component Value Range   Glucose-Capillary 235 (*) 70 - 99 (mg/dL)  GLUCOSE, CAPILLARY     Status: Abnormal   Collection Time   11/02/11 11:36 AM      Component Value Range   Glucose-Capillary 194 (*) 70 - 99 (mg/dL)    Disposition: stable   Follow-up Appts: Discharge Orders    Future Orders Please Complete By Expires   Diet - low sodium heart healthy      Increase activity slowly      Discharge instructions      Comments:   Follow up with pcp in 1-2weeks      Signed: Lennell Shanks 11/02/2011, 1:39 PM

## 2012-06-02 IMAGING — CR DG ABDOMEN ACUTE W/ 1V CHEST
3 series · 3 of 3 positions shown · non-contrast
Comparison: 10/02/2011

CLINICAL DATA: Rule out small bowel obstruction, free air,
pneumonia.

ACUTE ABDOMEN SERIES (ABDOMEN 2 VIEW & CHEST 1 VIEW)

[w chest pa]
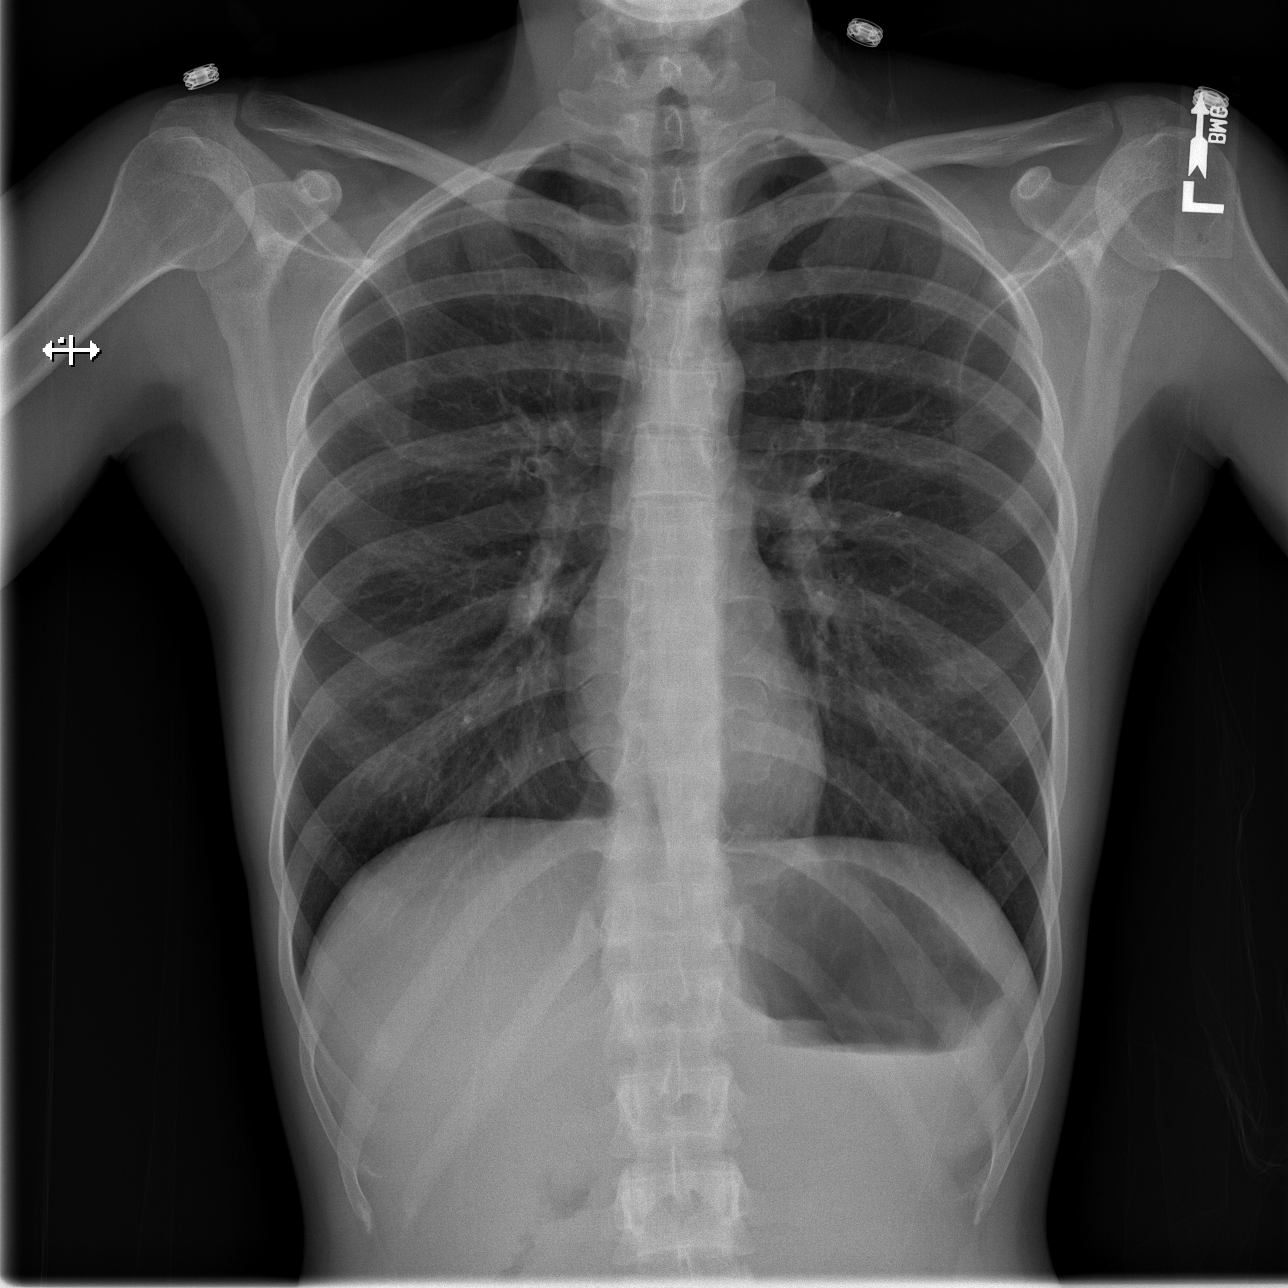

[w abdomen upright]
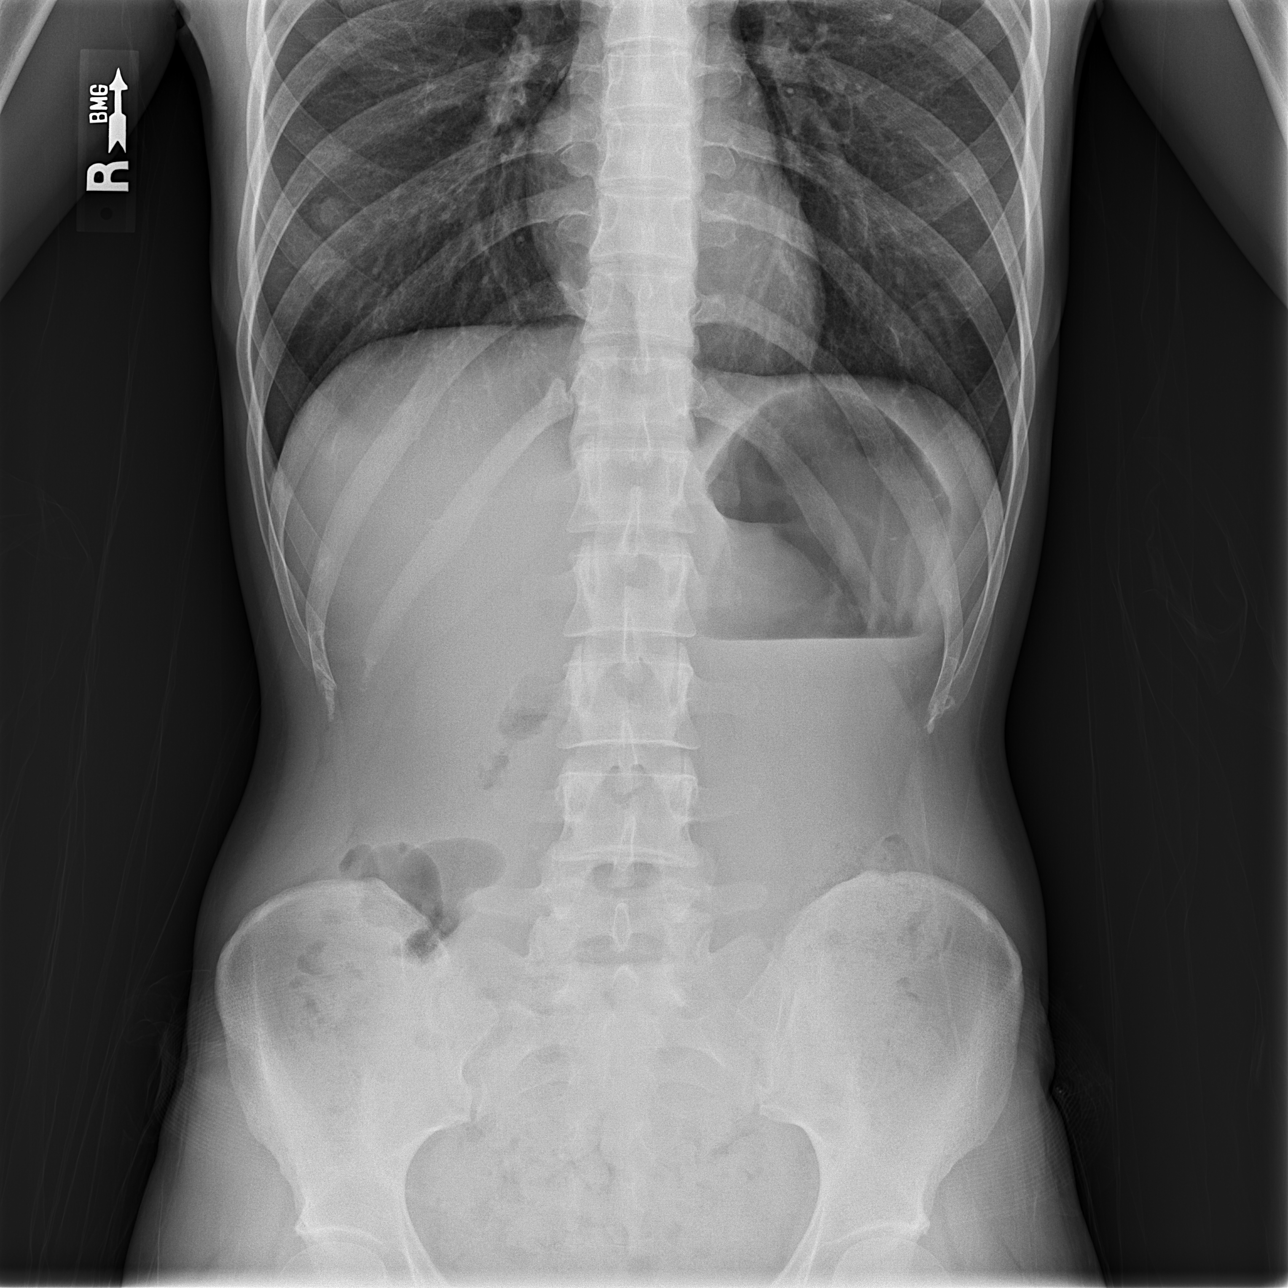

[t abdomen supine]
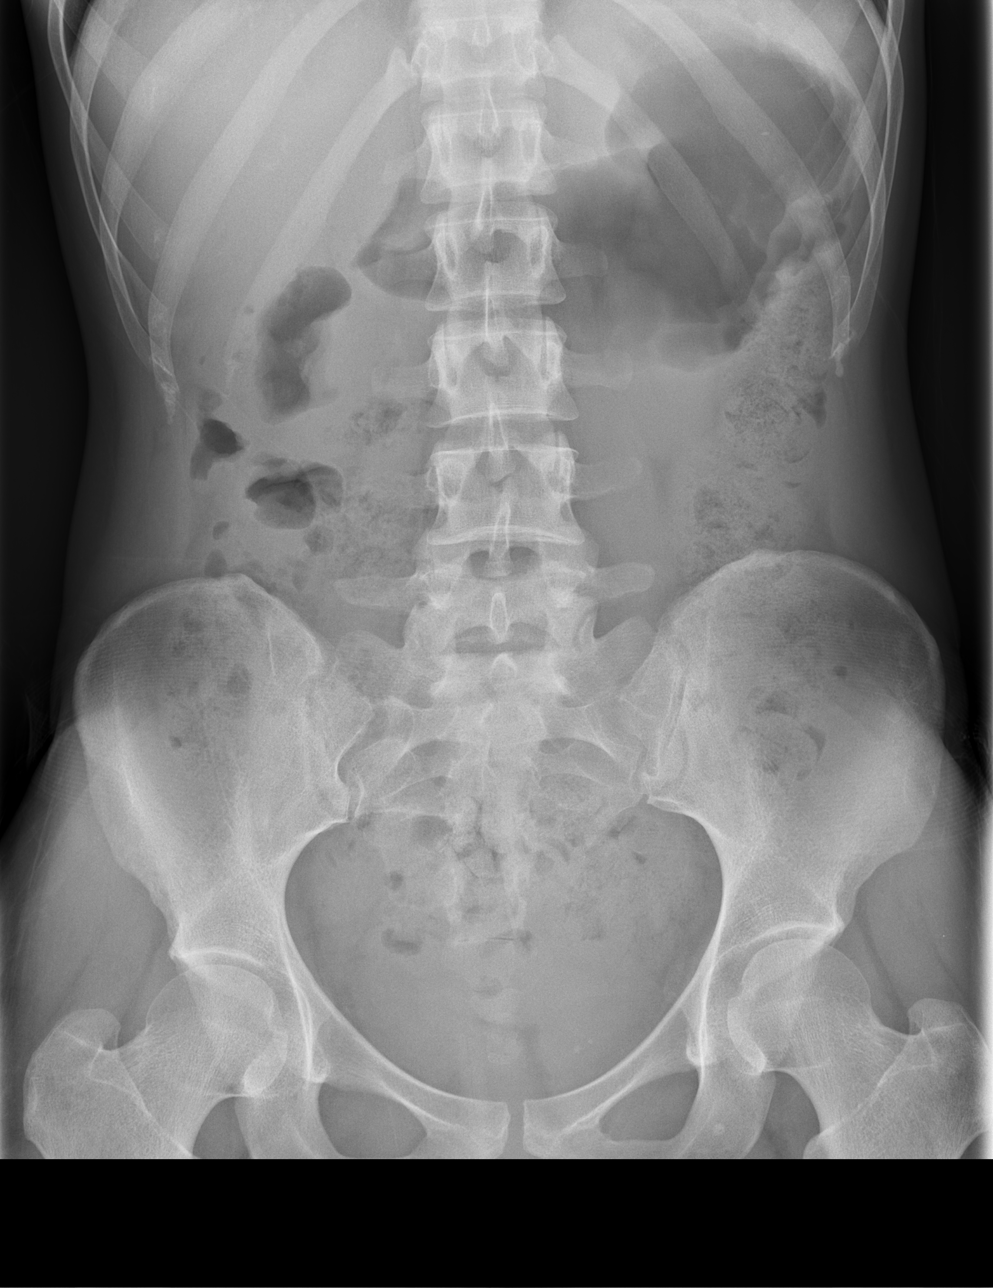

[3 of 3 positions shown; findings below may reference images not displayed]

FINDINGS: Lungs are hyperinflated.  There is perihilar
peribronchial thickening.  The heart size is normal.  There are no
focal consolidations or pleural effusions.  No edema.

Supine and erect views of the abdomen show a nonobstructive bowel
gas pattern.  There is moderate stool within the transverse,
descending, and sigmoid colon.  There is moderate air fluid level
within the stomach.
IMPRESSION: 1.  Hyperinflation and bronchitic changes in the lungs.
2.  Nonobstructive bowel gas pattern.

## 2012-07-20 ENCOUNTER — Emergency Department (HOSPITAL_COMMUNITY)
Admission: EM | Admit: 2012-07-20 | Discharge: 2012-07-20 | Disposition: A | Discharge status: Home / Self Care | Payer: Medicaid Other | Attending: Emergency Medicine | Admitting: Emergency Medicine

## 2012-07-20 ENCOUNTER — Emergency Department (HOSPITAL_COMMUNITY): Payer: Medicaid Other

## 2012-07-20 ENCOUNTER — Encounter (HOSPITAL_COMMUNITY): Payer: Self-pay | Admitting: Emergency Medicine

## 2012-07-20 DIAGNOSIS — Z880 Allergy status to penicillin: Secondary | ICD-10-CM | POA: Insufficient documentation

## 2012-07-20 DIAGNOSIS — M25519 Pain in unspecified shoulder: Secondary | ICD-10-CM | POA: Insufficient documentation

## 2012-07-20 DIAGNOSIS — M25512 Pain in left shoulder: Secondary | ICD-10-CM

## 2012-07-20 DIAGNOSIS — E119 Type 2 diabetes mellitus without complications: Secondary | ICD-10-CM | POA: Insufficient documentation

## 2012-07-20 DIAGNOSIS — Z886 Allergy status to analgesic agent status: Secondary | ICD-10-CM | POA: Insufficient documentation

## 2012-07-20 DIAGNOSIS — M542 Cervicalgia: Secondary | ICD-10-CM

## 2012-07-20 DIAGNOSIS — F17200 Nicotine dependence, unspecified, uncomplicated: Secondary | ICD-10-CM | POA: Insufficient documentation

## 2012-07-20 DIAGNOSIS — Z794 Long term (current) use of insulin: Secondary | ICD-10-CM | POA: Insufficient documentation

## 2012-07-20 MED ORDER — IBUPROFEN 800 MG PO TABS: 800.0000 mg | ORAL_TABLET | Freq: Three times a day (TID) | ORAL | Status: DC

## 2012-07-20 MED ORDER — CYCLOBENZAPRINE HCL 10 MG PO TABS: 10.0000 mg | ORAL_TABLET | Freq: Two times a day (BID) | ORAL | Status: DC | PRN
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
## 2012-09-17 ENCOUNTER — Emergency Department (HOSPITAL_COMMUNITY): Payer: Medicaid Other

## 2012-09-17 ENCOUNTER — Encounter (HOSPITAL_COMMUNITY): Payer: Self-pay | Admitting: *Deleted

## 2012-09-17 ENCOUNTER — Emergency Department (HOSPITAL_COMMUNITY)
Admission: EM | Admit: 2012-09-17 | Discharge: 2012-09-17 | Disposition: A | Discharge status: Home / Self Care | Payer: Medicaid Other | Attending: Emergency Medicine | Admitting: Emergency Medicine

## 2012-09-17 DIAGNOSIS — W108XXA Fall (on) (from) other stairs and steps, initial encounter: Secondary | ICD-10-CM | POA: Insufficient documentation

## 2012-09-17 DIAGNOSIS — IMO0002 Reserved for concepts with insufficient information to code with codable children: Secondary | ICD-10-CM | POA: Insufficient documentation

## 2012-09-17 DIAGNOSIS — Y929 Unspecified place or not applicable: Secondary | ICD-10-CM | POA: Insufficient documentation

## 2012-09-17 DIAGNOSIS — Y939 Activity, unspecified: Secondary | ICD-10-CM | POA: Insufficient documentation

## 2012-09-17 DIAGNOSIS — S3992XA Unspecified injury of lower back, initial encounter: Secondary | ICD-10-CM

## 2012-09-17 DIAGNOSIS — F17200 Nicotine dependence, unspecified, uncomplicated: Secondary | ICD-10-CM | POA: Insufficient documentation

## 2012-09-17 DIAGNOSIS — E1110 Type 2 diabetes mellitus with ketoacidosis without coma: Secondary | ICD-10-CM | POA: Insufficient documentation

## 2012-09-17 DIAGNOSIS — Z794 Long term (current) use of insulin: Secondary | ICD-10-CM | POA: Insufficient documentation

## 2012-09-17 MED ORDER — HYDROCODONE-ACETAMINOPHEN 5-325 MG PO TABS
1.0000 | ORAL_TABLET | Freq: Once | ORAL | Status: AC
  Administered 2012-09-17: 1 via ORAL
  Filled 2012-09-17: qty 1

## 2012-09-17 MED ORDER — HYDROCODONE-ACETAMINOPHEN 5-325 MG PO TABS: 1.0000 | ORAL_TABLET | ORAL | Status: AC | PRN

## 2012-09-17 NOTE — ED Notes (Signed)
Fell down several steps last night, low back and buttock pain,

## 2012-09-20 ENCOUNTER — Emergency Department (HOSPITAL_COMMUNITY)
Admission: EM | Admit: 2012-09-20 | Discharge: 2012-09-20 | Disposition: A | Discharge status: Home / Self Care | Payer: Medicaid Other | Attending: Emergency Medicine | Admitting: Emergency Medicine

## 2012-09-20 ENCOUNTER — Encounter (HOSPITAL_COMMUNITY): Payer: Self-pay | Admitting: Emergency Medicine

## 2012-09-20 DIAGNOSIS — Z79899 Other long term (current) drug therapy: Secondary | ICD-10-CM | POA: Insufficient documentation

## 2012-09-20 DIAGNOSIS — Y929 Unspecified place or not applicable: Secondary | ICD-10-CM | POA: Insufficient documentation

## 2012-09-20 DIAGNOSIS — F17200 Nicotine dependence, unspecified, uncomplicated: Secondary | ICD-10-CM | POA: Insufficient documentation

## 2012-09-20 DIAGNOSIS — W19XXXA Unspecified fall, initial encounter: Secondary | ICD-10-CM | POA: Insufficient documentation

## 2012-09-20 DIAGNOSIS — R7309 Other abnormal glucose: Secondary | ICD-10-CM | POA: Insufficient documentation

## 2012-09-20 DIAGNOSIS — R739 Hyperglycemia, unspecified: Secondary | ICD-10-CM

## 2012-09-20 DIAGNOSIS — Y939 Activity, unspecified: Secondary | ICD-10-CM | POA: Insufficient documentation

## 2012-09-20 DIAGNOSIS — Z794 Long term (current) use of insulin: Secondary | ICD-10-CM | POA: Insufficient documentation

## 2012-09-20 DIAGNOSIS — E1110 Type 2 diabetes mellitus with ketoacidosis without coma: Secondary | ICD-10-CM | POA: Insufficient documentation

## 2012-09-20 DIAGNOSIS — J069 Acute upper respiratory infection, unspecified: Secondary | ICD-10-CM | POA: Insufficient documentation

## 2012-09-20 DIAGNOSIS — H5710 Ocular pain, unspecified eye: Secondary | ICD-10-CM | POA: Insufficient documentation

## 2012-09-20 DIAGNOSIS — S3992XA Unspecified injury of lower back, initial encounter: Secondary | ICD-10-CM

## 2012-09-20 DIAGNOSIS — IMO0002 Reserved for concepts with insufficient information to code with codable children: Secondary | ICD-10-CM | POA: Insufficient documentation

## 2012-09-20 LAB — URINE MICROSCOPIC-ADD ON

## 2012-09-20 LAB — CBC WITH DIFFERENTIAL/PLATELET
Basophils Relative: 0 % (ref 0–1)
Eosinophils Absolute: 0.1 10*3/uL (ref 0.0–0.7)
Hemoglobin: 12.6 g/dL (ref 12.0–15.0)
Lymphs Abs: 1.9 10*3/uL (ref 0.7–4.0)
MCH: 29.5 pg (ref 26.0–34.0)
Neutro Abs: 6.5 10*3/uL (ref 1.7–7.7)
Neutrophils Relative %: 73 % (ref 43–77)
Platelets: 254 10*3/uL (ref 150–400)
RBC: 4.27 MIL/uL (ref 3.87–5.11)

## 2012-09-20 LAB — URINALYSIS, ROUTINE W REFLEX MICROSCOPIC
Nitrite: NEGATIVE
Specific Gravity, Urine: <1.005 — ABNORMAL LOW (ref 1.005–1.030)
Urobilinogen, UA: 0.2 mg/dL (ref 0.0–1.0)
pH: 6 (ref 5.0–8.0)

## 2012-09-20 LAB — GLUCOSE, CAPILLARY
Glucose-Capillary: >600 mg/dL (ref 70–99)
Glucose-Capillary: 49 mg/dL — ABNORMAL LOW (ref 70–99)
Glucose-Capillary: 72 mg/dL (ref 70–99)

## 2012-09-20 LAB — BASIC METABOLIC PANEL
Chloride: 97 mEq/L (ref 96–112)
GFR calc Af Amer: >90 mL/min (ref >90)
GFR calc non Af Amer: >90 mL/min (ref >90)
Glucose, Bld: 596 mg/dL (ref 70–99)
Potassium: 3.4 mEq/L — ABNORMAL LOW (ref 3.5–5.1)
Sodium: 134 mEq/L — ABNORMAL LOW (ref 135–145)

## 2012-09-20 MED ORDER — DEXTROSE 50 % IV SOLN: 25.0000 mL | INTRAVENOUS | Status: DC | PRN

## 2012-09-20 MED ORDER — INSULIN REGULAR HUMAN 100 UNIT/ML IJ SOLN
INTRAMUSCULAR | Status: DC
  Administered 2012-09-20: 2.5 [IU]/h via INTRAVENOUS
  Filled 2012-09-20: qty 1

## 2012-09-20 MED ORDER — DEXTROSE-NACL 5-0.45 % IV SOLN: INTRAVENOUS | Status: DC

## 2012-09-20 MED ORDER — SODIUM CHLORIDE 0.9 % IV SOLN
INTRAVENOUS | Status: DC
  Administered 2012-09-20: 17:00:00 via INTRAVENOUS

## 2012-09-20 MED ORDER — HYDROCODONE-ACETAMINOPHEN 5-325 MG PO TABS: 1.0000 | ORAL_TABLET | ORAL | Status: DC | PRN

## 2012-09-20 MED ORDER — SODIUM CHLORIDE 0.9 % IV BOLUS (SEPSIS)
2000.0000 mL | Freq: Once | INTRAVENOUS | Status: AC
  Administered 2012-09-20: 2000 mL via INTRAVENOUS

## 2012-09-20 MED ORDER — HYDROCODONE-ACETAMINOPHEN 5-325 MG PO TABS
1.0000 | ORAL_TABLET | Freq: Once | ORAL | Status: AC
  Administered 2012-09-20: 1 via ORAL
  Filled 2012-09-20: qty 1

## 2012-09-20 MED ORDER — SODIUM CHLORIDE 0.9 % IV SOLN: INTRAVENOUS | Status: DC

## 2012-09-20 MED ORDER — INSULIN ASPART 100 UNIT/ML ~~LOC~~ SOLN
4.0000 [IU] | Freq: Once | SUBCUTANEOUS | Status: AC
  Administered 2012-09-20: 4 [IU] via SUBCUTANEOUS
  Filled 2012-09-20 (×2): qty 1

## 2012-09-20 NOTE — ED Notes (Addendum)
Glucose 49 upon checking, pt given orange juice and nabs. EDP notified. Insulin drip stopped. Pt remains asymptomatic at this time.
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
## 2012-09-20 NOTE — ED Provider Notes (Signed)
History  This chart was scribed for Flint Melter, MD by Ardeen Jourdain, ED Scribe. This patient was seen in room APA12/APA12 and the patient's care was started at 1352.  CSN: [id number]  Arrival date & time 09/20/12  1323   First MD Initiated Contact with Patient 09/20/12 1352      Chief Complaint  Patient presents with  . Hyperglycemia  . Eye Pain     The history is provided by the patient. No language interpreter was used.    [name] is a 26 y.o. female who presents to the Emergency Department complaining of hypoglycemia and bilateral eye itching and swelling. She states the eye symptoms started 1 day ago. She has associated nausea, weakness and feeling tired. She denies fever, emesis, diarrhea, cough and sore throat as associated symptoms. She reports falling 2 days ago and injuring her tailbone. She has a h/o DM and DKA. She is a current everyday smoker but denies alcohol use.   Past Medical History  Diagnosis Date  . Diabetes mellitus   . DKA 05/01/2011    Past Surgical History  Procedure Date  . Tubal ligation     Family History  Problem Relation Age of Onset  . Coronary artery disease Mother   . Lung cancer Mother   . Heart attack Father 25    History  Substance Use Topics  . Smoking status: Current Every Day Smoker -- 0.5 packs/day for 12 years    Types: Cigarettes  . Smokeless tobacco: Never Used     Comment: She is currently trying to quit.  . Alcohol Use: No    OB History    Grav Para Term Preterm Abortions TAB SAB Ect Mult Living   2 2              Review of Systems  All other systems reviewed and are negative.  A complete 10 system review of systems was obtained and all systems are negative except as noted in the HPI and PMH.    Allergies  Codeine; Penicillins; and Tramadol  Home Medications   Current Outpatient Rx  Name  Route  Sig  Dispense  Refill  . HYDROCODONE-ACETAMINOPHEN 5-325 MG PO TABS   Oral   Take 1 tablet by  mouth every 4 (four) hours as needed for pain.   20 tablet   0   . INSULIN ASPART 100 UNIT/ML [place] SOLN   Subcutaneous   Inject 2-4 Units into the skin 3 (three) times daily before meals. Use sliding scale with meals 3 times a day: If 150-250, inject 2 units If 250-350, inject 4 units          . INSULIN GLARGINE 100 UNIT/ML [place] SOLN   Subcutaneous   Inject 40 Units into the skin at bedtime.         . OMEPRAZOLE 20 MG PO CPDR   Oral   Take 20 mg by mouth daily. For heartburn         . HYDROCODONE-ACETAMINOPHEN 5-325 MG PO TABS   Oral   Take 1 tablet by mouth every 4 (four) hours as needed for pain.   20 tablet   0     Triage Vitals: BP 133/68  Pulse 94  Temp 98.3 F (36.8 C) (Oral)  Resp 18  Ht 5' (1.524 m)  Wt 108 lb (48.988 kg)  BMI 21.09 kg/m2  SpO2 99%  LMP 09/12/2012  Physical Exam  Nursing note and vitals reviewed. Constitutional: She is  oriented to person, place, and time. She appears well-developed and well-nourished. No distress.  HENT:  Head: Normocephalic and atraumatic.  Right Ear: External ear normal.  Left Ear: External ear normal.  Mouth/Throat: Oropharynx is clear and moist. No oropharyngeal exudate.  Eyes: Conjunctivae normal and EOM are normal. Pupils are equal, round, and reactive to light.       Eyelids erythematous bilaterally, mild conjunctival injection   Neck: Normal range of motion. Neck supple. No tracheal deviation present.  Cardiovascular: Normal rate, regular rhythm and normal heart sounds.   Pulmonary/Chest: Effort normal and breath sounds normal. No respiratory distress.  Abdominal: Soft. She exhibits no distension.       Right mild CVA tenderness   Musculoskeletal: Normal range of motion. She exhibits no edema.  Neurological: She is alert and oriented to person, place, and time.  Skin: Skin is warm and dry.  Psychiatric: She has a normal mood and affect. Her behavior is normal.    ED Course  Procedures (including critical  care time)  Emergency department treatment: IV fluid 2 L bolus, with maintenance to follow. Subcutaneous insulin, sliding scale dose.   Additional treatment included starting patient on gluose stabilizer with IV insuln. Blood sugar improved then , then became low. She was treated with oral food and nutrition, with improvement of the blood sugar.  DIAGNOSTIC STUDIES: Oxygen Saturation is 99% on room air, normal by my interpretation.    COORDINATION OF CARE:  2:00 PM: Discussed treatment plan which includes IV fluids, blood work and monitoring with pt at bedside and pt agreed to plan.   CRITICAL CARE Performed by: Flint Melter   Total critical care time: 35 minutes  Critical care time was exclusive of separately billable procedures and treating other patients.  Critical care was necessary to treat or prevent imminent or life-threatening deterioration.  Critical care was time spent personally by me on the following activities: development of treatment plan with patient and/or surrogate as well as nursing, discussions with consultants, evaluation of patient's response to treatment, examination of patient, obtaining history from patient or surrogate, ordering and performing treatments and interventions, ordering and review of laboratory studies, ordering and review of radiographic studies, pulse oximetry and re-evaluation of patient's condition.  Labs Reviewed  BASIC METABOLIC PANEL - Abnormal; Notable for the following:    Sodium 134 (*)     Potassium 3.4 (*)     Glucose, Bld 596 (*)     All other components within normal limits  URINALYSIS, ROUTINE W REFLEX MICROSCOPIC - Abnormal; Notable for the following:    Specific Gravity, Urine <1.005 (*)     Glucose, UA >1000 (*)     Hgb urine dipstick LARGE (*)     All other components within normal limits  GLUCOSE, CAPILLARY - Abnormal; Notable for the following:    Glucose-Capillary >600 (*)     All other components within normal  limits  GLUCOSE, CAPILLARY - Abnormal; Notable for the following:    Glucose-Capillary 538 (*)     All other components within normal limits  GLUCOSE, CAPILLARY - Abnormal; Notable for the following:    Glucose-Capillary 308 (*)     All other components within normal limits  GLUCOSE, CAPILLARY - Abnormal; Notable for the following:    Glucose-Capillary 201 (*)     All other components within normal limits  GLUCOSE, CAPILLARY - Abnormal; Notable for the following:    Glucose-Capillary 49 (*)     All other components within normal  limits  CBC WITH DIFFERENTIAL  URINE MICROSCOPIC-ADD ON  GLUCOSE, CAPILLARY   No results found.   1. Hyperglycemia   2. URI (upper respiratory infection)   3. Coccygeal injury       MDM  Hyperglycemia without ketosis. No clear cause for elevated sugar. Possibly related to presence of URI. Patient starts that she is taking her usual medications, as prescribed. Doubt metabolic instability, serious bacterial infection or impending vascular collapse; the patient is stable for discharge.   Plan: Home Medications- usual, and symptomatic for URI; Home Treatments- rest, increase fluids; Recommended follow up- PCP prn    I personally performed the services described in this documentation, which was scribed in my presence. The recorded information has been reviewed and is accurate.     Flint Melter, MD 09/21/12 [phone]

## 2012-09-20 NOTE — ED Notes (Signed)
cbg machine reading high

## 2012-09-20 NOTE — ED Provider Notes (Signed)
History     CSN: [id number]  Arrival date & time 09/17/12  4782   First MD Initiated Contact with Patient 09/17/12 1839      Chief Complaint  Patient presents with  . Fall    (Consider location/radiation/quality/duration/timing/severity/associated sxs/prior treatment) HPI Comments: [name] slipped on wet steps last night going down her landing,  And fell on her buttocks.  She reports pain in her coccyx since the event without radiation. The pain is sharp,  Throbbing and has no radiation or numbness in her lower extremities.  She has applied ice and used ibuprofen without relief of symptoms.  She denies other injury including head injury and had no loc.  The history is provided by the patient.    Past Medical History  Diagnosis Date  . Diabetes mellitus   . DKA 05/01/2011    Past Surgical History  Procedure Date  . Tubal ligation     Family History  Problem Relation Age of Onset  . Coronary artery disease Mother   . Lung cancer Mother   . Heart attack Father 35    History  Substance Use Topics  . Smoking status: Current Every Day Smoker -- 0.5 packs/day for 12 years    Types: Cigarettes  . Smokeless tobacco: Never Used     Comment: She is currently trying to quit.  . Alcohol Use: No    OB History    Grav Para Term Preterm Abortions TAB SAB Ect Mult Living   2 2              Review of Systems  Constitutional: Negative for fever.  Respiratory: Negative for shortness of breath.   Cardiovascular: Negative for chest pain and leg swelling.  Gastrointestinal: Negative for abdominal pain, constipation and abdominal distention.  Genitourinary: Negative for dysuria, urgency, frequency, flank pain and difficulty urinating.  Musculoskeletal: Positive for back pain. Negative for joint swelling and gait problem.  Skin: Negative for rash.  Neurological: Negative for weakness and numbness.    Allergies  Codeine; Penicillins; and Tramadol  Home Medications    Current Outpatient Rx  Name  Route  Sig  Dispense  Refill  . INSULIN ASPART 100 UNIT/ML [place] SOLN   Subcutaneous   Inject 2-4 Units into the skin 3 (three) times daily before meals. Use sliding scale with meals 3 times a day: If 150-250, inject 2 units If 250-350, inject 4 units          . INSULIN GLARGINE 100 UNIT/ML [place] SOLN   Subcutaneous   Inject 40 Units into the skin at bedtime.         . OMEPRAZOLE 20 MG PO CPDR   Oral   Take 20-40 mg by mouth daily as needed. For heartburn         . HYDROCODONE-ACETAMINOPHEN 5-325 MG PO TABS   Oral   Take 1 tablet by mouth every 4 (four) hours as needed for pain.   20 tablet   0     BP 125/80  Pulse 98  Temp 97.9 F (36.6 C) (Oral)  Resp 20  Ht 5' (1.524 m)  Wt 108 lb (48.988 kg)  BMI 21.09 kg/m2  SpO2 100%  LMP 09/12/2012  Physical Exam  Nursing note and vitals reviewed. Constitutional: She appears well-developed and well-nourished.  HENT:  Head: Normocephalic.  Eyes: Conjunctivae normal are normal.  Neck: Normal range of motion. Neck supple.  Cardiovascular: Normal rate and intact distal pulses.  Pedal pulses normal.  Pulmonary/Chest: Effort normal.  Abdominal: Soft. Bowel sounds are normal. She exhibits no distension and no mass.  Musculoskeletal: Normal range of motion. She exhibits no edema.       Lumbar back: She exhibits tenderness. She exhibits no swelling, no edema and no spasm.       TTP across bilateral upper buttocks and extremely tender at coccyx. No ecchymosis or edema appreciated.  Neurological: She is alert. She has normal strength. She displays no atrophy and no tremor. No sensory deficit. Gait normal.  Reflex Scores:      Patellar reflexes are 2+ on the right side and 2+ on the left side.      Achilles reflexes are 2+ on the right side and 2+ on the left side.      No strength deficit noted in hip and knee flexor and extensor muscle groups.  Ankle flexion and extension intact.  Skin: Skin is  warm and dry.  Psychiatric: She has a normal mood and affect.    ED Course  Procedures (including critical care time)  Labs Reviewed - No data to display No results found.   1. Coccygeal injury       MDM  Patients labs and/or radiological studies were reviewed during the medical decision making and disposition process. Pt prescribed hydrocodone,  Encouraged ice, may switch to heat in 2 days.  Donut pillow,  Expect slow resolution of pain sx.  Motrin prn.        Burgess Amor, [place] 09/20/12 [phone]

## 2012-09-20 NOTE — ED Notes (Signed)
Pt tolerated diet soft drink. No c/o voiced at this time.

## 2012-09-20 NOTE — ED Notes (Signed)
Glucose monitor started at 2.5 ml/hr. Pt stable at this time.

## 2012-09-21 NOTE — ED Provider Notes (Signed)
Medical screening examination/treatment/procedure(s) were performed by non-physician practitioner and as supervising physician I was immediately available for consultation/collaboration.  [name], MD 09/21/12 [phone]

## 2013-02-27 ENCOUNTER — Encounter (HOSPITAL_COMMUNITY): Payer: Self-pay | Admitting: Emergency Medicine

## 2013-02-27 ENCOUNTER — Emergency Department (HOSPITAL_COMMUNITY): Payer: Medicaid Other

## 2013-02-27 ENCOUNTER — Emergency Department (HOSPITAL_COMMUNITY)
Admission: EM | Admit: 2013-02-27 | Discharge: 2013-02-28 | Disposition: A | Discharge status: Home / Self Care | Payer: Medicaid Other | Attending: Emergency Medicine | Admitting: Emergency Medicine

## 2013-02-27 DIAGNOSIS — Z794 Long term (current) use of insulin: Secondary | ICD-10-CM | POA: Insufficient documentation

## 2013-02-27 DIAGNOSIS — R142 Eructation: Secondary | ICD-10-CM | POA: Insufficient documentation

## 2013-02-27 DIAGNOSIS — E119 Type 2 diabetes mellitus without complications: Secondary | ICD-10-CM | POA: Insufficient documentation

## 2013-02-27 DIAGNOSIS — R1900 Intra-abdominal and pelvic swelling, mass and lump, unspecified site: Secondary | ICD-10-CM | POA: Insufficient documentation

## 2013-02-27 DIAGNOSIS — Z9851 Tubal ligation status: Secondary | ICD-10-CM | POA: Insufficient documentation

## 2013-02-27 DIAGNOSIS — F17200 Nicotine dependence, unspecified, uncomplicated: Secondary | ICD-10-CM | POA: Insufficient documentation

## 2013-02-27 DIAGNOSIS — Z3202 Encounter for pregnancy test, result negative: Secondary | ICD-10-CM | POA: Insufficient documentation

## 2013-02-27 DIAGNOSIS — R141 Gas pain: Secondary | ICD-10-CM | POA: Insufficient documentation

## 2013-02-27 DIAGNOSIS — Z79899 Other long term (current) drug therapy: Secondary | ICD-10-CM | POA: Insufficient documentation

## 2013-02-27 DIAGNOSIS — Z880 Allergy status to penicillin: Secondary | ICD-10-CM | POA: Insufficient documentation

## 2013-02-27 DIAGNOSIS — K5900 Constipation, unspecified: Secondary | ICD-10-CM

## 2013-02-27 LAB — URINALYSIS, ROUTINE W REFLEX MICROSCOPIC
Bilirubin Urine: NEGATIVE
Glucose, UA: >1000 mg/dL — AB
Hgb urine dipstick: NEGATIVE
Ketones, ur: NEGATIVE mg/dL
Leukocytes, UA: NEGATIVE
Nitrite: NEGATIVE
Protein, ur: NEGATIVE mg/dL
Specific Gravity, Urine: 1.02 (ref 1.005–1.030)
Urobilinogen, UA: 0.2 mg/dL (ref 0.0–1.0)
pH: 5.5 (ref 5.0–8.0)

## 2013-02-27 LAB — BASIC METABOLIC PANEL
BUN: 13 mg/dL (ref 6–23)
CO2: 28 mEq/L (ref 19–32)
Calcium: 9.5 mg/dL (ref 8.4–10.5)
Chloride: 96 mEq/L (ref 96–112)
Creatinine, Ser: 0.53 mg/dL (ref 0.50–1.10)
GFR calc Af Amer: >90 mL/min (ref >90)
GFR calc non Af Amer: >90 mL/min (ref >90)
Glucose, Bld: 448 mg/dL — ABNORMAL HIGH (ref 70–99)
Potassium: 4 mEq/L (ref 3.5–5.1)
Sodium: 133 mEq/L — ABNORMAL LOW (ref 135–145)

## 2013-02-27 LAB — CBC
HCT: 37.1 % (ref 36.0–46.0)
Hemoglobin: 12.6 g/dL (ref 12.0–15.0)
MCH: 26.9 pg (ref 26.0–34.0)
MCHC: 34 g/dL (ref 30.0–36.0)
MCV: 79.3 fL (ref 78.0–100.0)
Platelets: 242 10*3/uL (ref 150–400)
RBC: 4.68 MIL/uL (ref 3.87–5.11)
RDW: 16.9 % — ABNORMAL HIGH (ref 11.5–15.5)
WBC: 9.1 10*3/uL (ref 4.0–10.5)

## 2013-02-27 LAB — PREGNANCY, URINE: Preg Test, Ur: NEGATIVE

## 2013-02-27 LAB — GLUCOSE, CAPILLARY: Glucose-Capillary: 354 mg/dL — ABNORMAL HIGH (ref 70–99)

## 2013-02-27 LAB — URINE MICROSCOPIC-ADD ON

## 2013-02-27 MED ORDER — METOCLOPRAMIDE HCL 10 MG PO TABS: 10.0000 mg | ORAL_TABLET | Freq: Four times a day (QID) | ORAL | Status: DC | PRN

## 2013-02-27 MED ORDER — ONDANSETRON HCL 4 MG/2ML IJ SOLN
4.0000 mg | Freq: Once | INTRAMUSCULAR | Status: AC
  Administered 2013-02-27: 4 mg via INTRAVENOUS
  Filled 2013-02-27: qty 2

## 2013-02-27 MED ORDER — FLEET ENEMA 7-19 GM/118ML RE ENEM
1.0000 | ENEMA | Freq: Once | RECTAL | Status: AC
  Administered 2013-02-27: 1 via RECTAL

## 2013-02-27 MED ORDER — SODIUM CHLORIDE 0.9 % IV BOLUS (SEPSIS)
1000.0000 mL | Freq: Once | INTRAVENOUS | Status: AC
  Administered 2013-02-27: 1000 mL via INTRAVENOUS

## 2013-02-27 MED ORDER — POLYETHYLENE GLYCOL 3350 17 G PO PACK: 17.0000 g | PACK | Freq: Three times a day (TID) | ORAL | Status: DC | PRN

## 2013-02-27 NOTE — ED Notes (Signed)
Pts abd is distended and tight.

## 2013-02-27 NOTE — ED Provider Notes (Signed)
History    This chart was scribed for Raeford Razor, MD,  by Concha Se, ED Scribe and Bennett Scrape, ED Scribe. The patient was seen in room APA19/APA19 and the patient's care was started at 9:39 PM.  CSN: [id number]  Arrival date & time 02/27/13  2053    Chief Complaint  Patient presents with  . Abdominal Pain    The history is provided by the patient and the spouse. No language interpreter was used.   HPI Comments: [name] is a 27 y.o. female who presents to the Emergency Department complaining one week of gradual onset, gradually worsening, constant, severe, constant lower abdominal pain with associated 5 days of abdominal swelling, 8 days of constipation and foul smelling burps. She reports no bowel movement in 8 days but admits that she is still passing gas. She states that she had similar symptoms about 1.5 weeks ago that resolved after one dose of exlax. She reports that she has also tried taking nexum with no improvement. She denies taking any OTC pain medication to improve her symptoms and denies being on any narcotic pain medications currently. Pt denies fever, chills, nausea, vomiting, diarrhea, weakness, cough, SOB and any other pain. Pt states previous medical history of tubal ligations with medical history of diabetes and DKA. She is currently taking 100 ML of Lantus before bed.   PCP is Dr. Jaci Lazier  Past Medical History  Diagnosis Date  . Diabetes mellitus   . DKA 05/01/2011    Past Surgical History  Procedure Laterality Date  . Tubal ligation      Family History  Problem Relation Age of Onset  . Coronary artery disease Mother   . Lung cancer Mother   . Heart attack Father 45    History  Substance Use Topics  . Smoking status: Current Every Day Smoker -- 0.50 packs/day for 12 years    Types: Cigarettes  . Smokeless tobacco: Never Used     Comment: She is currently trying to quit.  . Alcohol Use: No    OB History   Grav Para Term Preterm  Abortions TAB SAB Ect Mult Living   2 2              Review of Systems  A complete 10 system review of systems was obtained and all systems are negative except as noted in the HPI and PMH.   Allergies  Codeine; Penicillins; and Tramadol  Home Medications   Current Outpatient Rx  Name  Route  Sig  Dispense  Refill  . insulin aspart (NOVOLOG) 100 UNIT/ML injection   Subcutaneous   Inject 2-4 Units into the skin 3 (three) times daily before meals. Use sliding scale with meals 3 times a day: If 150-250, inject 2 units If 250-350, inject 4 units          . insulin glargine (LANTUS) 100 UNIT/ML injection   Subcutaneous   Inject 40 Units into the skin at bedtime.         [name] Kitchen omeprazole (PRILOSEC) 20 MG capsule   Oral   Take 20 mg by mouth daily. For heartburn         . ranitidine (ZANTAC) 150 MG tablet   Oral   Take 150 mg by mouth daily as needed for heartburn (Currently talking in place of Omeprazole).           Triage Vitals: BP 121/79  Pulse 111  Temp(Src) 97.5 F (36.4 C) (Oral)  Resp 20  Ht  5\' 1"  (1.549 m)  Wt 115 lb (52.164 kg)  BMI 21.74 kg/m2  SpO2 99%  LMP 02/08/2013  Physical Exam  Nursing note and vitals reviewed. Constitutional: She is oriented to person, place, and time. She appears well-developed and well-nourished. No distress.  HENT:  Head: Normocephalic and atraumatic.  Eyes: Conjunctivae and EOM are normal.  Neck: Neck supple. No tracheal deviation present.  Cardiovascular: Normal rate and regular rhythm.   Pulmonary/Chest: Effort normal and breath sounds normal. No respiratory distress.  Abdominal: Soft. She exhibits distension. There is tenderness (diffuse). There is no rebound and no guarding.  No palpable mass.  Musculoskeletal: Normal range of motion. She exhibits no edema.  Neurological: She is alert and oriented to person, place, and time.  Skin: Skin is warm and dry.  Psychiatric: She has a normal mood and affect. Her behavior is  normal.    ED Course  Procedures (including critical care time)  DIAGNOSTIC STUDIES: Oxygen Saturation is 99% on room air, normal by my interpretation.    COORDINATION OF CARE: 9:43 PM-Discussed ED treatment with pt and pt agrees to treatment plan.    Labs Reviewed  URINALYSIS, ROUTINE W REFLEX MICROSCOPIC - Abnormal; Notable for the following:    Glucose, UA >1000 (*)    All other components within normal limits  CBC - Abnormal; Notable for the following:    RDW 16.9 (*)    All other components within normal limits  BASIC METABOLIC PANEL - Abnormal; Notable for the following:    Sodium 133 (*)    Glucose, Bld 448 (*)    All other components within normal limits  GLUCOSE, CAPILLARY - Abnormal; Notable for the following:    Glucose-Capillary 354 (*)    All other components within normal limits  PREGNANCY, URINE  URINE MICROSCOPIC-ADD ON   No results found.  Dg Abd Acute W/chest  02/27/2013   *RADIOLOGY REPORT*  Clinical Data: Abdominal pain, bloating.  ACUTE ABDOMEN SERIES (ABDOMEN 2 VIEW & CHEST 1 VIEW)  Comparison: 09/17/2012  Findings: Moderate to large stool burden throughout the colon. Mild gaseous distention of the colon.  Large amount of material within the stomach.  No evidence of obstruction or free air.  No organomegaly or suspicious calcification.  Heart is normal size.  No focal airspace opacities in the lungs. No effusions or acute bony abnormality.  IMPRESSION: Moderate to large stool burden. Large amount of material within the stomach.  No evidence of bowel obstruction or free air.   Original Report Authenticated By: Charlett Nose, M.D.    1. Constipation       MDM  27yF with constipation. Possibly some element of gastroparesis. Bowel regimen. Return precautions discussed.    I personally preformed the services scribed in my presence. The recorded information has been reviewed is accurate. Raeford Razor, MD.      Raeford Razor, MD 03/02/13 [phone]

## 2013-02-27 NOTE — ED Notes (Signed)
Pt states she has not had a bowel movement in 8 days. Pt states she does pass gas and has been burping.

## 2013-03-01 ENCOUNTER — Emergency Department (HOSPITAL_COMMUNITY): Payer: Medicaid Other

## 2013-03-01 ENCOUNTER — Emergency Department (HOSPITAL_COMMUNITY)
Admission: EM | Admit: 2013-03-01 | Discharge: 2013-03-02 | Discharge status: Against Medical Advice | Payer: Medicaid Other | Attending: Emergency Medicine | Admitting: Emergency Medicine

## 2013-03-01 ENCOUNTER — Encounter (HOSPITAL_COMMUNITY): Payer: Self-pay | Admitting: *Deleted

## 2013-03-01 DIAGNOSIS — R143 Flatulence: Secondary | ICD-10-CM | POA: Insufficient documentation

## 2013-03-01 DIAGNOSIS — Z880 Allergy status to penicillin: Secondary | ICD-10-CM | POA: Insufficient documentation

## 2013-03-01 DIAGNOSIS — R141 Gas pain: Secondary | ICD-10-CM | POA: Insufficient documentation

## 2013-03-01 DIAGNOSIS — Z9851 Tubal ligation status: Secondary | ICD-10-CM | POA: Insufficient documentation

## 2013-03-01 DIAGNOSIS — R1084 Generalized abdominal pain: Secondary | ICD-10-CM | POA: Insufficient documentation

## 2013-03-01 DIAGNOSIS — R142 Eructation: Secondary | ICD-10-CM | POA: Insufficient documentation

## 2013-03-01 DIAGNOSIS — E119 Type 2 diabetes mellitus without complications: Secondary | ICD-10-CM | POA: Insufficient documentation

## 2013-03-01 DIAGNOSIS — Z794 Long term (current) use of insulin: Secondary | ICD-10-CM | POA: Insufficient documentation

## 2013-03-01 DIAGNOSIS — K5900 Constipation, unspecified: Secondary | ICD-10-CM | POA: Insufficient documentation

## 2013-03-01 DIAGNOSIS — F17200 Nicotine dependence, unspecified, uncomplicated: Secondary | ICD-10-CM | POA: Insufficient documentation

## 2013-03-01 DIAGNOSIS — Z79899 Other long term (current) drug therapy: Secondary | ICD-10-CM | POA: Insufficient documentation

## 2013-03-01 MED ORDER — ONDANSETRON HCL 4 MG/2ML IJ SOLN
4.0000 mg | Freq: Once | INTRAMUSCULAR | Status: AC
  Administered 2013-03-01: 4 mg via INTRAVENOUS
  Filled 2013-03-01: qty 2

## 2013-03-01 MED ORDER — FLEET ENEMA 7-19 GM/118ML RE ENEM: 1.0000 | ENEMA | Freq: Once | RECTAL | Status: DC

## 2013-03-01 MED ORDER — IOHEXOL 300 MG/ML  SOLN
50.0000 mL | Freq: Once | INTRAMUSCULAR | Status: AC | PRN
  Administered 2013-03-01: 50 mL via ORAL

## 2013-03-01 NOTE — ED Notes (Signed)
12 days since last BM , seen here on Wednesday. And told she was constipated and told to take laxatives.Nausea, no vomiting.

## 2013-03-01 NOTE — ED Provider Notes (Signed)
History     CSN: [id number]  Arrival date & time 03/01/13  2156   First MD Initiated Contact with Patient 03/01/13 2217      Chief Complaint  Patient presents with  . Constipation    (Consider location/radiation/quality/duration/timing/severity/associated sxs/prior treatment) Patient is a 27 y.o. female presenting with constipation. The history is provided by the patient.  Constipation  The current episode started more than 1 week ago. The problem has been gradually worsening. The pain is moderate. The stool is described as hard. There was no prior successful therapy. Prior unsuccessful therapies include laxatives and stool softeners. Associated symptoms include abdominal pain. Pertinent negatives include no fever, no nausea, no vomiting, no headaches and no rash.   [name] is a 27 y.o. female who was evaluated here 2 days ago for constipation and abdominal pain. She had an x-ray that showed she was constipated and was instructed to take stool softeners and laxatives. She has tried them without results. Her abdominal pain and abdominal distension have increased.  Past Medical History  Diagnosis Date  . Diabetes mellitus   . DKA 05/01/2011    Past Surgical History  Procedure Laterality Date  . Tubal ligation      Family History  Problem Relation Age of Onset  . Coronary artery disease Mother   . Lung cancer Mother   . Heart attack Father 5    History  Substance Use Topics  . Smoking status: Current Every Day Smoker -- 0.50 packs/day for 12 years    Types: Cigarettes  . Smokeless tobacco: Never Used     Comment: She is currently trying to quit.  . Alcohol Use: No    OB History   Grav Para Term Preterm Abortions TAB SAB Ect Mult Living   2 2              Review of Systems  Constitutional: Negative for fever and chills.  Gastrointestinal: Positive for abdominal pain and constipation. Negative for nausea and vomiting.  Genitourinary: Negative for urgency  and frequency.  Musculoskeletal: Negative for back pain.  Skin: Negative for rash.  Neurological: Negative for headaches.  Psychiatric/Behavioral: The patient is not nervous/anxious.     Allergies  Codeine; Penicillins; and Tramadol  Home Medications   Current Outpatient Rx  Name  Route  Sig  Dispense  Refill  . docusate sodium (COLACE) 100 MG capsule   Oral   Take 300 mg by mouth daily as needed for constipation.         . insulin aspart (NOVOLOG) 100 UNIT/ML injection   Subcutaneous   Inject 2-4 Units into the skin 3 (three) times daily before meals. Use sliding scale with meals 3 times a day: If 150-250, inject 2 units If 250-350, inject 4 units          . insulin glargine (LANTUS) 100 UNIT/ML injection   Subcutaneous   Inject 35 Units into the skin at bedtime.          . magnesium hydroxide (MILK OF MAGNESIA) 400 MG/5ML suspension   Oral   Take 15 mLs by mouth daily as needed for constipation.         [name] Kitchen omeprazole (PRILOSEC) 20 MG capsule   Oral   Take 20 mg by mouth daily. For heartburn         . ranitidine (ZANTAC) 150 MG tablet   Oral   Take 150 mg by mouth daily as needed for heartburn (Currently talking in place of  Omeprazole).         [name] Kitchen metoCLOPramide (REGLAN) 10 MG tablet   Oral   Take 1 tablet (10 mg total) by mouth 4 (four) times daily as needed (nausea).   20 tablet   0   . polyethylene glycol (MIRALAX / GLYCOLAX) packet   Oral   Take 17 g by mouth 3 (three) times daily as needed (constipation).   14 each   0     BP 108/78  Pulse 101  Temp(Src) 97.8 F (36.6 C) (Oral)  Resp 20  Ht 5\' 1"  (1.549 m)  Wt 115 lb (52.164 kg)  BMI 21.74 kg/m2  SpO2 100%  LMP 02/08/2013  Physical Exam  Nursing note and vitals reviewed. Constitutional: She is oriented to person, place, and time. She appears well-developed and well-nourished. No distress.  HENT:  Head: Normocephalic.  Eyes: EOM are normal.  Neck: Neck supple.  Pulmonary/Chest:  Effort normal.  Abdominal: Soft. She exhibits distension. Bowel sounds are decreased. There is generalized tenderness. There is no rebound and no CVA tenderness.  Genitourinary:  Rectal exam = good tone, no tenderness, no hemorrhoids. Small amount of stool palpated.  Musculoskeletal: Normal range of motion.  Neurological: She is alert and oriented to person, place, and time. No cranial nerve deficit.  Skin: Skin is warm and dry.  Psychiatric: She has a normal mood and affect. Her behavior is normal.    ED Course  Procedures (including critical care time) After drinking the contrast media for the CT scan and having an enema the patient had a large stool but still complains of abdominal pain.  MDM  Patient states that she had another BM and her abdomen is fine now. No pain and not distended. States she is not going to have the CT. Patient left AMA. 27 y.o. female with abdominal pain and constipation        Janne Napoleon, NP 03/02/13 [phone]

## 2013-03-02 LAB — CBC WITH DIFFERENTIAL/PLATELET
Eosinophils Relative: 0 % (ref 0–5)
HCT: 38.9 % (ref 36.0–46.0)
Lymphocytes Relative: 30 % (ref 12–46)
Lymphs Abs: 3.3 10*3/uL (ref 0.7–4.0)
MCV: 80.2 fL (ref 78.0–100.0)
Monocytes Absolute: 0.6 10*3/uL (ref 0.1–1.0)
RBC: 4.85 MIL/uL (ref 3.87–5.11)
RDW: 16.7 % — ABNORMAL HIGH (ref 11.5–15.5)
WBC: 11 10*3/uL — ABNORMAL HIGH (ref 4.0–10.5)

## 2013-03-02 NOTE — ED Notes (Signed)
Patient drank her contrast and had a large bm. stated her stomach felt a little better

## 2013-03-02 NOTE — ED Provider Notes (Signed)
Medical screening examination/treatment/procedure(s) were performed by non-physician practitioner and as supervising physician I was immediately available for consultation/collaboration.   Jaymien Landin B. Bernette Mayers, MD 03/02/13 [phone]

## 2013-03-02 NOTE — ED Notes (Signed)
Patient up and ambulating to bathroom with another large bm

## 2013-06-17 ENCOUNTER — Emergency Department (HOSPITAL_COMMUNITY)
Admission: EM | Admit: 2013-06-17 | Discharge: 2013-06-17 | Disposition: A | Discharge status: Home / Self Care | Payer: Medicaid Other | Attending: Emergency Medicine | Admitting: Emergency Medicine

## 2013-06-17 ENCOUNTER — Emergency Department (HOSPITAL_COMMUNITY): Payer: Medicaid Other

## 2013-06-17 ENCOUNTER — Encounter (HOSPITAL_COMMUNITY): Payer: Self-pay | Admitting: *Deleted

## 2013-06-17 DIAGNOSIS — R229 Localized swelling, mass and lump, unspecified: Secondary | ICD-10-CM | POA: Insufficient documentation

## 2013-06-17 DIAGNOSIS — Z79899 Other long term (current) drug therapy: Secondary | ICD-10-CM | POA: Insufficient documentation

## 2013-06-17 DIAGNOSIS — Z794 Long term (current) use of insulin: Secondary | ICD-10-CM | POA: Insufficient documentation

## 2013-06-17 DIAGNOSIS — E1110 Type 2 diabetes mellitus with ketoacidosis without coma: Secondary | ICD-10-CM | POA: Insufficient documentation

## 2013-06-17 DIAGNOSIS — Z880 Allergy status to penicillin: Secondary | ICD-10-CM | POA: Insufficient documentation

## 2013-06-17 DIAGNOSIS — F17200 Nicotine dependence, unspecified, uncomplicated: Secondary | ICD-10-CM | POA: Insufficient documentation

## 2013-06-17 MED ORDER — IBUPROFEN 800 MG PO TABS: 800.0000 mg | ORAL_TABLET | Freq: Three times a day (TID) | ORAL | Status: DC

## 2013-06-17 NOTE — ED Notes (Signed)
"  knot " to plantar surface of rt foot heel, Pain rt leg for several mos.

## 2013-06-17 NOTE — ED Provider Notes (Signed)
CSN: [id number]     Arrival date & time 06/17/13  1910 History   First MD Initiated Contact with Patient 06/17/13 1943     Chief Complaint  Patient presents with  . Foot Pain   (Consider location/radiation/quality/duration/timing/severity/associated sxs/prior Treatment) HPI Comments: [name] is a 27 y.o. Female with a several month history of a burning, stabbing pain from her right plantar foot radiating into her calf which has worsened over the past 4 days since noticing a knot on her foot at the originating site of pain.  Her pain is gone at rest, and triggered by palpation and weight bearing.  She denies injury, denies any history of puncture wounds or stepping on foreign objects.  She is a diabetic and is very careful with her feet,  Stating she never goes barefoot and wears supportive shoes, generally athletic shoes.  Her cbgis are also stable,  Last check today was in the low 100's.  She denies fevers, chills and drainage from the site of the knot.    The history is provided by the patient.    Past Medical History  Diagnosis Date  . Diabetes mellitus   . DKA 05/01/2011   Past Surgical History  Procedure Laterality Date  . Tubal ligation     Family History  Problem Relation Age of Onset  . Coronary artery disease Mother   . Lung cancer Mother   . Heart attack Father 39   History  Substance Use Topics  . Smoking status: Current Every Day Smoker -- 0.50 packs/day for 12 years    Types: Cigarettes  . Smokeless tobacco: Never Used     Comment: She is currently trying to quit.  . Alcohol Use: No   OB History   Grav Para Term Preterm Abortions TAB SAB Ect Mult Living   2 2             Review of Systems  Constitutional: Negative for fever.  Musculoskeletal: Positive for arthralgias. Negative for myalgias and joint swelling.  Neurological: Negative for weakness and numbness.    Allergies  Codeine; Penicillins; and Tramadol  Home Medications   Current  Outpatient Rx  Name  Route  Sig  Dispense  Refill  . insulin aspart (NOVOLOG) 100 UNIT/ML injection   Subcutaneous   Inject 2-4 Units into the skin 3 (three) times daily before meals. Use sliding scale with meals 3 times a day: If 150-250, inject 2 units If 250-350, inject 4 units          . insulin glargine (LANTUS) 100 UNIT/ML injection   Subcutaneous   Inject 40 Units into the skin at bedtime.         [name] Kitchen omeprazole (PRILOSEC) 20 MG capsule   Oral   Take 20 mg by mouth daily. For heartburn         . polyethylene glycol powder (GLYCOLAX/MIRALAX) powder   Oral   Take 17 g by mouth every other day.         . ranitidine (ZANTAC) 150 MG tablet   Oral   Take 150 mg by mouth daily as needed for heartburn (Currently talking in place of Omeprazole).         [name] Kitchen ibuprofen (ADVIL,MOTRIN) 800 MG tablet   Oral   Take 1 tablet (800 mg total) by mouth 3 (three) times daily.   21 tablet   0    BP 125/77  Pulse 84  Temp(Src) 99 F (37.2 C) (Oral)  Resp 18  Ht 5' (1.524 m)  Wt 105 lb (47.628 kg)  BMI 20.51 kg/m2  SpO2 98%  LMP 06/03/2013 Physical Exam  Constitutional: She appears well-developed and well-nourished.  HENT:  Head: Atraumatic.  Neck: Normal range of motion.  Cardiovascular:  Pulses equal bilaterally  Musculoskeletal: She exhibits no edema and no tenderness.  Neurological: She is alert. She has normal strength. She displays normal reflexes. No sensory deficit.  Equal strength  Skin: Skin is warm and dry.  1 cm induration right plantar foot just medial and distal to calcaneous. Skin colored with no erythema, no fluctuance, no puncture or visible retained foreign body, skin is intact.  There is no black speckling to suggest this is a plantar wart.  Psychiatric: She has a normal mood and affect.    ED Course  Procedures (including critical care time) Labs Review Labs Reviewed - No data to display Imaging Review Dg Foot Complete Right  06/17/2013    *RADIOLOGY REPORT*  Clinical Data: Foot pain  RIGHT FOOT COMPLETE - 3+ VIEW  Comparison: None.  Findings: Normal alignment without fracture.  Preserved joint spaces.  No soft tissue abnormality.  IMPRESSION: No acute finding.   Original Report Authenticated By: Judie Petit. Miles Costain, M.D.    MDM   1. Skin nodule    Pt with a cutaneous foot nodule with radicular pain with palpation and pressure.  Doubt plantar wart, doubt simple callous as location is closer to arch of foot, less likely to be  Weight bearing or friction site.  No sign of infection or abscess.  xrays reviewed, no radiopaque fb.  With radiation of pain suggestive of nerve involvement, will refer to podiatry for further management. She was given crutches to help minimize the pain of weight bearing.  Encouraged ibuprofen.  She will call Dr. Pricilla Holm tomorrow for office f/u.    Burgess Amor, PA-C 06/18/13 1234

## 2013-06-18 NOTE — ED Provider Notes (Signed)
Medical screening examination/treatment/procedure(s) were performed by non-physician practitioner and as supervising physician I was immediately available for consultation/collaboration.   Laray Anger, DO 06/18/13 [phone]

## 2013-12-31 ENCOUNTER — Ambulatory Visit (HOSPITAL_COMMUNITY)
Admission: RE | Admit: 2013-12-31 | Discharge: 2013-12-31 | Disposition: A | Discharge status: Home / Self Care | Payer: MEDICAID | Attending: Psychiatry | Admitting: Psychiatry

## 2013-12-31 NOTE — BH Assessment (Signed)
Assessment Note  [name] is an 28 y.o. female requesting detox from crack/cocaine.  Patient reports that she has been abusing crack since she was 28 years old.  Patient reports using crack cocaine daily.  Patient reports that she uses $60.00 - $100.00 daily.    Patient denies withdrawal symptoms.  Patient reports that her last use was earlier today.  Patient reports that she wants to stop using so that she can take care of her two children.  Patient reports increased depression associated with her inability to stop using.  Patient denies previous detox or treatment.  Patient denies previous psychiatric hospitalization.  Patient denies SI/HI and psychosis.    Writer consulted with the NP, Aggie.  Per, Aggie the NP does not meet criteria for inpatient hospitalization.    Axis I: Opiate Detox  Axis II: Deferred Axis III:  Past Medical History  Diagnosis Date  . Diabetes mellitus   . DKA 05/01/2011   Axis IV: economic problems, occupational problems, other psychosocial or environmental problems, problems related to social environment, problems with access to health care services and problems with primary support group Axis V: 31-40 impairment in reality testing  Past Medical History:  Past Medical History  Diagnosis Date  . Diabetes mellitus   . DKA 05/01/2011    Past Surgical History  Procedure Laterality Date  . Tubal ligation      Family History:  Family History  Problem Relation Age of Onset  . Coronary artery disease Mother   . Lung cancer Mother   . Heart attack Father 3550    Social History:  reports that she has been smoking Cigarettes.  She has a 6 pack-year smoking history. She has never used smokeless tobacco. She reports that she does not drink alcohol or use illicit drugs.  Additional Social History:     CIWA:   COWS:    Allergies:  Allergies  Allergen Reactions  . Codeine Nausea And Vomiting  . Penicillins Swelling  . Tramadol Itching    Home  Medications:  (Not in a hospital admission)  OB/GYN Status:  No LMP recorded.  General Assessment Data Location of Assessment: [facility] Services Is this a Tele or Face-to-Face Assessment?: Face-to-Face Is this an Initial Assessment or a Re-assessment for this encounter?: Initial Assessment Living Arrangements: Parent (Parent and her two children) Can pt return to current living arrangement?: Yes Admission Status: Voluntary Is patient capable of signing voluntary admission?: Yes Transfer from: Acute Hospital Referral Source: Self/Family/Friend  Medical Screening Exam [facility] Walk-in ONLY) Medical Exam completed:  (Signed [facility] and declined a medical exam )  [facility] Living Arrangements: Parent (Parent and her two children) Name of Psychiatrist: NA Name of Therapist: NA  Education Status Is patient currently in school?: No Current Grade: NA Highest grade of school patient has completed: NA Name of school: NA Contact person: NA  Risk to self Suicidal Ideation: No Suicidal Intent: No Is patient at risk for suicide?: No Suicidal Plan?: No Access to Means: No What has been your use of drugs/alcohol within the last 12 months?: Cocaine/Crack Previous Attempts/Gestures: No How many times?: 0 Other Self Harm Risks: NA Triggers for Past Attempts: Unpredictable Intentional Self Injurious Behavior: None Family Suicide History: No Recent stressful life event(s): Job Loss;Financial Problems Persecutory voices/beliefs?: No Depression: Yes Depression Symptoms: Tearfulness;Fatigue;Loss of interest in usual pleasures;Feeling worthless/self pity;Despondent Substance abuse history and/or treatment for substance abuse?: No Suicide prevention information given to non-admitted patients: Not applicable  Risk  to Others Homicidal Ideation: No Thoughts of Harm to Others: No Current Homicidal Intent: No Current Homicidal Plan: No Access to Homicidal Means: No Identified  Victim: NA History of harm to others?: No Assessment of Violence: None Noted Violent Behavior Description: NA Does patient have access to weapons?: No Criminal Charges Pending?: No Does patient have a court date: No  Psychosis Hallucinations: None noted Delusions: None noted  Mental Status Report Appear/Hygiene: Disheveled Eye Contact: Poor Motor Activity: Freedom of movement Speech: Logical/coherent Level of Consciousness: Alert Mood: Depressed Affect: Anxious Anxiety Level: Minimal Thought Processes: Coherent;Relevant Judgement: Unimpaired Orientation: Person;Place;Time;Situation Obsessive Compulsive Thoughts/Behaviors: None  Cognitive Functioning Concentration: Decreased Memory: Recent Intact;Remote Intact IQ: Average Insight: Fair Impulse Control: Poor Appetite: Fair Weight Loss: 0 Weight Gain: 0 Sleep: Decreased Total Hours of Sleep: 3 Vegetative Symptoms: Decreased grooming;Not bathing;Staying in bed  ADLScreening [facility]) Patient's cognitive ability adequate to safely complete daily activities?: Yes Patient able to express need for assistance with ADLs?: Yes Independently performs ADLs?: Yes (appropriate for developmental age)  Prior Inpatient Therapy Prior Inpatient Therapy: No Prior Therapy Dates: NA Prior Therapy Facilty/Provider(s): NA Reason for Treatment: NA  Prior Outpatient Therapy Prior Outpatient Therapy: No Prior Therapy Dates: NA Prior Therapy Facilty/Provider(s): NA Reason for Treatment: NA  ADL Screening (condition at time of admission) Patient's cognitive ability adequate to safely complete daily activities?: Yes Patient able to express need for assistance with ADLs?: Yes Independently performs ADLs?: Yes (appropriate for developmental age)                  Additional Information 1:1 In Past 12 Months?: No CIRT Risk: No Elopement Risk: No Does patient have medical clearance?: No     Disposition:   Disposition Initial Assessment Completed for this Encounter: Yes Disposition of Patient: Other dispositions Other disposition(s): Other (Comment) (Referred to outpatient services. )  On Site Evaluation by:   Reviewed with Physician:    Phillip Heal LaVerne 12/31/2013 3:36 PM

## 2014-02-27 ENCOUNTER — Ambulatory Visit: Payer: Medicaid Other | Admitting: Obstetrics

## 2014-07-26 ENCOUNTER — Ambulatory Visit (HOSPITAL_COMMUNITY)
Admission: EM | Admit: 2014-07-26 | Discharge: 2014-07-26 | Disposition: A | Discharge status: Home / Self Care | Payer: Medicaid Other | Attending: Emergency Medicine | Admitting: Emergency Medicine

## 2014-07-26 ENCOUNTER — Encounter (HOSPITAL_COMMUNITY)
Admission: EM | Disposition: A | Discharge status: Home / Self Care | Payer: Self-pay | Source: Home / Self Care | Attending: Emergency Medicine

## 2014-07-26 ENCOUNTER — Encounter (HOSPITAL_COMMUNITY): Payer: Self-pay | Admitting: Emergency Medicine

## 2014-07-26 ENCOUNTER — Emergency Department (HOSPITAL_COMMUNITY): Payer: Medicaid Other

## 2014-07-26 ENCOUNTER — Encounter (HOSPITAL_COMMUNITY): Payer: Medicaid Other | Admitting: Anesthesiology

## 2014-07-26 ENCOUNTER — Emergency Department (HOSPITAL_COMMUNITY): Payer: Medicaid Other | Admitting: Anesthesiology

## 2014-07-26 DIAGNOSIS — E1165 Type 2 diabetes mellitus with hyperglycemia: Secondary | ICD-10-CM | POA: Insufficient documentation

## 2014-07-26 DIAGNOSIS — Z7982 Long term (current) use of aspirin: Secondary | ICD-10-CM | POA: Diagnosis not present

## 2014-07-26 DIAGNOSIS — Y92410 Unspecified street and highway as the place of occurrence of the external cause: Secondary | ICD-10-CM | POA: Insufficient documentation

## 2014-07-26 DIAGNOSIS — Z880 Allergy status to penicillin: Secondary | ICD-10-CM | POA: Diagnosis not present

## 2014-07-26 DIAGNOSIS — F17210 Nicotine dependence, cigarettes, uncomplicated: Secondary | ICD-10-CM | POA: Insufficient documentation

## 2014-07-26 DIAGNOSIS — Z885 Allergy status to narcotic agent status: Secondary | ICD-10-CM | POA: Insufficient documentation

## 2014-07-26 DIAGNOSIS — S61412A Laceration without foreign body of left hand, initial encounter: Secondary | ICD-10-CM | POA: Diagnosis not present

## 2014-07-26 DIAGNOSIS — S51812A Laceration without foreign body of left forearm, initial encounter: Secondary | ICD-10-CM | POA: Diagnosis not present

## 2014-07-26 DIAGNOSIS — Z794 Long term (current) use of insulin: Secondary | ICD-10-CM | POA: Insufficient documentation

## 2014-07-26 DIAGNOSIS — Z79899 Other long term (current) drug therapy: Secondary | ICD-10-CM | POA: Insufficient documentation

## 2014-07-26 DIAGNOSIS — T23132A Burn of first degree of multiple left fingers (nail), not including thumb, initial encounter: Secondary | ICD-10-CM | POA: Insufficient documentation

## 2014-07-26 DIAGNOSIS — S59902A Unspecified injury of left elbow, initial encounter: Secondary | ICD-10-CM

## 2014-07-26 DIAGNOSIS — R739 Hyperglycemia, unspecified: Secondary | ICD-10-CM

## 2014-07-26 HISTORY — PX: WOUND EXPLORATION: SHX6188

## 2014-07-26 LAB — GLUCOSE, CAPILLARY
GLUCOSE-CAPILLARY: 132 mg/dL — AB (ref 70–99)
GLUCOSE-CAPILLARY: 76 mg/dL (ref 70–99)
GLUCOSE-CAPILLARY: 62 mg/dL — AB (ref 70–99)
Glucose-Capillary: 116 mg/dL — ABNORMAL HIGH (ref 70–99)

## 2014-07-26 LAB — COMPREHENSIVE METABOLIC PANEL
ALBUMIN: 3.8 g/dL (ref 3.5–5.2)
ALK PHOS: 95 U/L (ref 39–117)
ALT: 21 U/L (ref 0–35)
AST: 25 U/L (ref 0–37)
Anion gap: 14 (ref 5–15)
BILIRUBIN TOTAL: 0.3 mg/dL (ref 0.3–1.2)
BUN: 6 mg/dL (ref 6–23)
CHLORIDE: 97 meq/L (ref 96–112)
CO2: 25 meq/L (ref 19–32)
CREATININE: 0.53 mg/dL (ref 0.50–1.10)
Calcium: 9.2 mg/dL (ref 8.4–10.5)
GFR calc Af Amer: >90 mL/min (ref >90)
Glucose, Bld: 584 mg/dL (ref 70–99)
POTASSIUM: 4.2 meq/L (ref 3.7–5.3)
SODIUM: 136 meq/L — AB (ref 137–147)
Total Protein: 7.7 g/dL (ref 6.0–8.3)

## 2014-07-26 LAB — CBC WITH DIFFERENTIAL/PLATELET
Basophils Absolute: 0 10*3/uL (ref 0.0–0.1)
Basophils Relative: 0 % (ref 0–1)
EOS PCT: 0 % (ref 0–5)
Eosinophils Absolute: 0 10*3/uL (ref 0.0–0.7)
HEMATOCRIT: 37.9 % (ref 36.0–46.0)
Hemoglobin: 13 g/dL (ref 12.0–15.0)
LYMPHS ABS: 1.7 10*3/uL (ref 0.7–4.0)
LYMPHS PCT: 14 % (ref 12–46)
MCH: 27.1 pg (ref 26.0–34.0)
MCHC: 34.3 g/dL (ref 30.0–36.0)
MCV: 79.1 fL (ref 78.0–100.0)
Monocytes Absolute: 0.7 10*3/uL (ref 0.1–1.0)
Monocytes Relative: 5 % (ref 3–12)
NEUTROS ABS: 10.1 10*3/uL — AB (ref 1.7–7.7)
Neutrophils Relative %: 81 % — ABNORMAL HIGH (ref 43–77)
PLATELETS: 267 10*3/uL (ref 150–400)
RBC: 4.79 MIL/uL (ref 3.87–5.11)
RDW: 16.9 % — ABNORMAL HIGH (ref 11.5–15.5)
WBC: 12.5 10*3/uL — AB (ref 4.0–10.5)

## 2014-07-26 LAB — I-STAT CHEM 8, ED
BUN: 4 mg/dL — AB (ref 6–23)
CALCIUM ION: 1.13 mmol/L (ref 1.12–1.23)
CHLORIDE: 100 meq/L (ref 96–112)
Creatinine, Ser: 0.5 mg/dL (ref 0.50–1.10)
Glucose, Bld: 581 mg/dL (ref 70–99)
HCT: 44 % (ref 36.0–46.0)
Hemoglobin: 15 g/dL (ref 12.0–15.0)
POTASSIUM: 4 meq/L (ref 3.7–5.3)
Sodium: 136 mEq/L — ABNORMAL LOW (ref 137–147)
TCO2: 24 mmol/L (ref 0–100)

## 2014-07-26 LAB — CBG MONITORING, ED: Glucose-Capillary: 454 mg/dL — ABNORMAL HIGH (ref 70–99)

## 2014-07-26 LAB — ETHANOL

## 2014-07-26 SURGERY — WOUND EXPLORATION
Anesthesia: General | Site: Arm Lower | Laterality: Left

## 2014-07-26 MED ORDER — BUPIVACAINE HCL (PF) 0.25 % IJ SOLN
INTRAMUSCULAR | Status: DC | PRN
  Administered 2014-07-26: 30 mL

## 2014-07-26 MED ORDER — SUCCINYLCHOLINE CHLORIDE 20 MG/ML IJ SOLN
INTRAMUSCULAR | Status: DC | PRN
Start: 2014-07-26 — End: 2014-07-26
  Administered 2014-07-26: 110 mg via INTRAVENOUS

## 2014-07-26 MED ORDER — LIDOCAINE HCL (CARDIAC) 20 MG/ML IV SOLN
INTRAVENOUS | Status: AC
  Filled 2014-07-26: qty 10

## 2014-07-26 MED ORDER — DEXAMETHASONE SODIUM PHOSPHATE 4 MG/ML IJ SOLN
INTRAMUSCULAR | Status: DC | PRN
  Administered 2014-07-26: 4 mg via INTRAVENOUS

## 2014-07-26 MED ORDER — FENTANYL CITRATE 0.05 MG/ML IJ SOLN
INTRAMUSCULAR | Status: AC
  Filled 2014-07-26: qty 5

## 2014-07-26 MED ORDER — DEXTROSE 5 % IV SOLN
INTRAVENOUS | Status: DC | PRN
  Administered 2014-07-26: 20:00:00 via INTRAVENOUS

## 2014-07-26 MED ORDER — MIDAZOLAM HCL 5 MG/5ML IJ SOLN
INTRAMUSCULAR | Status: DC | PRN
  Administered 2014-07-26: 2 mg via INTRAVENOUS

## 2014-07-26 MED ORDER — LACTATED RINGERS IV SOLN
INTRAVENOUS | Status: DC
  Administered 2014-07-26: 17:00:00 via INTRAVENOUS

## 2014-07-26 MED ORDER — SODIUM CHLORIDE 0.9 % IV BOLUS (SEPSIS)
1000.0000 mL | Freq: Once | INTRAVENOUS | Status: AC
  Administered 2014-07-26: 1000 mL via INTRAVENOUS

## 2014-07-26 MED ORDER — PROPOFOL 10 MG/ML IV BOLUS
INTRAVENOUS | Status: DC | PRN
  Administered 2014-07-26: 30 mg via INTRAVENOUS
  Administered 2014-07-26: 170 mg via INTRAVENOUS

## 2014-07-26 MED ORDER — FENTANYL CITRATE 0.05 MG/ML IJ SOLN
INTRAMUSCULAR | Status: DC | PRN
  Administered 2014-07-26 (×2): 50 ug via INTRAVENOUS
  Administered 2014-07-26: 100 ug via INTRAVENOUS
  Administered 2014-07-26: 50 ug via INTRAVENOUS

## 2014-07-26 MED ORDER — BUPIVACAINE HCL (PF) 0.25 % IJ SOLN
INTRAMUSCULAR | Status: AC
  Filled 2014-07-26: qty 30

## 2014-07-26 MED ORDER — CLINDAMYCIN PHOSPHATE 600 MG/50ML IV SOLN
INTRAVENOUS | Status: DC | PRN
  Administered 2014-07-26: 600 mg via INTRAVENOUS

## 2014-07-26 MED ORDER — ONDANSETRON HCL 4 MG/2ML IJ SOLN
4.0000 mg | Freq: Once | INTRAMUSCULAR | Status: AC
  Administered 2014-07-26: 4 mg via INTRAVENOUS
  Filled 2014-07-26: qty 2

## 2014-07-26 MED ORDER — CLINDAMYCIN PHOSPHATE 600 MG/50ML IV SOLN
600.0000 mg | Freq: Once | INTRAVENOUS | Status: AC
  Administered 2014-07-26: 600 mg via INTRAVENOUS
  Filled 2014-07-26: qty 50

## 2014-07-26 MED ORDER — PROMETHAZINE HCL 25 MG/ML IJ SOLN: 6.2500 mg | INTRAMUSCULAR | Status: DC | PRN

## 2014-07-26 MED ORDER — LACTATED RINGERS IV SOLN
INTRAVENOUS | Status: DC | PRN
  Administered 2014-07-26: 19:00:00 via INTRAVENOUS

## 2014-07-26 MED ORDER — MIDAZOLAM HCL 2 MG/2ML IJ SOLN
INTRAMUSCULAR | Status: AC
  Filled 2014-07-26: qty 2

## 2014-07-26 MED ORDER — ARTIFICIAL TEARS OP OINT
TOPICAL_OINTMENT | OPHTHALMIC | Status: DC | PRN
  Administered 2014-07-26: 1 via OPHTHALMIC

## 2014-07-26 MED ORDER — CLINDAMYCIN PHOSPHATE 900 MG/50ML IV SOLN
INTRAVENOUS | Status: AC
  Filled 2014-07-26: qty 50

## 2014-07-26 MED ORDER — INSULIN ASPART 100 UNIT/ML ~~LOC~~ SOLN
6.0000 [IU] | Freq: Once | SUBCUTANEOUS | Status: AC
  Administered 2014-07-26: 6 [IU] via INTRAVENOUS
  Filled 2014-07-26: qty 1

## 2014-07-26 MED ORDER — HYDROMORPHONE HCL 1 MG/ML IJ SOLN: 0.2500 mg | INTRAMUSCULAR | Status: DC | PRN

## 2014-07-26 MED ORDER — SULFAMETHOXAZOLE-TRIMETHOPRIM 800-160 MG PO TABS: 1.0000 | ORAL_TABLET | Freq: Two times a day (BID) | ORAL | Status: DC

## 2014-07-26 MED ORDER — ONDANSETRON HCL 4 MG/2ML IJ SOLN
INTRAMUSCULAR | Status: DC | PRN
  Administered 2014-07-26: 4 mg via INTRAVENOUS

## 2014-07-26 MED ORDER — IOHEXOL 300 MG/ML  SOLN
100.0000 mL | Freq: Once | INTRAMUSCULAR | Status: AC | PRN
  Administered 2014-07-26: 100 mL via INTRAVENOUS

## 2014-07-26 MED ORDER — DEXAMETHASONE SODIUM PHOSPHATE 4 MG/ML IJ SOLN
INTRAMUSCULAR | Status: AC
  Filled 2014-07-26: qty 1

## 2014-07-26 MED ORDER — HYDROMORPHONE HCL 1 MG/ML IJ SOLN
1.0000 mg | Freq: Once | INTRAMUSCULAR | Status: AC
  Administered 2014-07-26: 1 mg via INTRAVENOUS
  Filled 2014-07-26: qty 1

## 2014-07-26 MED ORDER — LIDOCAINE HCL (CARDIAC) 20 MG/ML IV SOLN
INTRAVENOUS | Status: DC | PRN
  Administered 2014-07-26: 100 mg via INTRAVENOUS
  Administered 2014-07-26: 50 mg via INTRAVENOUS

## 2014-07-26 MED ORDER — OXYCODONE-ACETAMINOPHEN 5-325 MG PO TABS: ORAL_TABLET | ORAL | Status: DC

## 2014-07-26 MED ORDER — 0.9 % SODIUM CHLORIDE (POUR BTL) OPTIME
TOPICAL | Status: DC | PRN
  Administered 2014-07-26: 1000 mL

## 2014-07-26 MED ORDER — PROPOFOL 10 MG/ML IV BOLUS
INTRAVENOUS | Status: AC
  Filled 2014-07-26: qty 20

## 2014-07-26 MED ORDER — SODIUM CHLORIDE 0.9 % IR SOLN
Status: DC | PRN
  Administered 2014-07-26: 3000 mL

## 2014-07-26 SURGICAL SUPPLY — 55 items
BANDAGE COBAN STERILE 2 (GAUZE/BANDAGES/DRESSINGS) IMPLANT
BANDAGE ELASTIC 3 VELCRO ST LF (GAUZE/BANDAGES/DRESSINGS) ×3 IMPLANT
BANDAGE ELASTIC 4 VELCRO ST LF (GAUZE/BANDAGES/DRESSINGS) ×3 IMPLANT
BNDG COHESIVE 1X5 TAN STRL LF (GAUZE/BANDAGES/DRESSINGS) IMPLANT
BNDG CONFORM 2 STRL LF (GAUZE/BANDAGES/DRESSINGS) IMPLANT
BNDG ESMARK 4X9 LF (GAUZE/BANDAGES/DRESSINGS) IMPLANT
BNDG GAUZE ELAST 4 BULKY (GAUZE/BANDAGES/DRESSINGS) ×3 IMPLANT
CORDS BIPOLAR (ELECTRODE) ×3 IMPLANT
COVER SURGICAL LIGHT HANDLE (MISCELLANEOUS) ×3 IMPLANT
DECANTER SPIKE VIAL GLASS SM (MISCELLANEOUS) IMPLANT
DRAIN PENROSE 1/4X12 LTX STRL (WOUND CARE) IMPLANT
DRSG ADAPTIC 3X8 NADH LF (GAUZE/BANDAGES/DRESSINGS) IMPLANT
DRSG EMULSION OIL 3X3 NADH (GAUZE/BANDAGES/DRESSINGS) IMPLANT
DRSG PAD ABDOMINAL 8X10 ST (GAUZE/BANDAGES/DRESSINGS) ×6 IMPLANT
GAUZE SPONGE 4X4 12PLY STRL (GAUZE/BANDAGES/DRESSINGS) ×3 IMPLANT
GAUZE XEROFORM 1X8 LF (GAUZE/BANDAGES/DRESSINGS) IMPLANT
GAUZE XEROFORM 5X9 LF (GAUZE/BANDAGES/DRESSINGS) ×3 IMPLANT
GLOVE BIO SURGEON STRL SZ7.5 (GLOVE) ×3 IMPLANT
GLOVE BIOGEL PI IND STRL 8 (GLOVE) ×1 IMPLANT
GLOVE BIOGEL PI INDICATOR 8 (GLOVE) ×2
GOWN STRL REIN XL XLG (GOWN DISPOSABLE) ×3 IMPLANT
HANDPIECE INTERPULSE COAX TIP (DISPOSABLE)
KIT BASIN OR (CUSTOM PROCEDURE TRAY) ×3 IMPLANT
KIT ROOM TURNOVER OR (KITS) ×3 IMPLANT
LOOP VESSEL MAXI BLUE (MISCELLANEOUS) ×3 IMPLANT
LOOP VESSEL MINI RED (MISCELLANEOUS) IMPLANT
MANIFOLD NEPTUNE II (INSTRUMENTS) ×3 IMPLANT
NEEDLE HYPO 25GX1X1/2 BEV (NEEDLE) ×3 IMPLANT
NEEDLE HYPO 25X1 1.5 SAFETY (NEEDLE) IMPLANT
NS IRRIG 1000ML POUR BTL (IV SOLUTION) ×3 IMPLANT
PACK ORTHO EXTREMITY (CUSTOM PROCEDURE TRAY) ×3 IMPLANT
PAD ARMBOARD 7.5X6 YLW CONV (MISCELLANEOUS) ×6 IMPLANT
SCRUB BETADINE 4OZ XXX (MISCELLANEOUS) ×3 IMPLANT
SET HNDPC FAN SPRY TIP SCT (DISPOSABLE) IMPLANT
SOLUTION BETADINE 4OZ (MISCELLANEOUS) ×3 IMPLANT
SPLINT PLASTER EXTRA FAST 3X15 (CAST SUPPLIES) ×2
SPLINT PLASTER GYPS XFAST 3X15 (CAST SUPPLIES) ×1 IMPLANT
SPONGE LAP 18X18 X RAY DECT (DISPOSABLE) ×3 IMPLANT
SPONGE LAP 4X18 X RAY DECT (DISPOSABLE) ×3 IMPLANT
SUCTION FRAZIER TIP 10 FR DISP (SUCTIONS) ×3 IMPLANT
SUT ETHIBOND 3-0 V-5 (SUTURE) ×3 IMPLANT
SUT ETHILON 3 0 PS 1 (SUTURE) ×9 IMPLANT
SUT ETHILON 4 0 PS 2 18 (SUTURE) ×6 IMPLANT
SUT MON AB 5-0 P3 18 (SUTURE) IMPLANT
SUT VICRYL AB 2 0 TIES (SUTURE) ×3 IMPLANT
SYR CONTROL 10ML LL (SYRINGE) ×3 IMPLANT
TOWEL OR 17X24 6PK STRL BLUE (TOWEL DISPOSABLE) ×6 IMPLANT
TOWEL OR 17X26 10 PK STRL BLUE (TOWEL DISPOSABLE) ×3 IMPLANT
TUBE ANAEROBIC SPECIMEN COL (MISCELLANEOUS) IMPLANT
TUBE CONNECTING 12'X1/4 (SUCTIONS) ×1
TUBE CONNECTING 12X1/4 (SUCTIONS) ×2 IMPLANT
TUBE FEEDING 5FR 15 INCH (TUBING) IMPLANT
UNDERPAD 30X30 INCONTINENT (UNDERPADS AND DIAPERS) ×3 IMPLANT
WATER STERILE IRR 1000ML POUR (IV SOLUTION) IMPLANT
YANKAUER SUCT BULB TIP NO VENT (SUCTIONS) IMPLANT

## 2014-07-26 NOTE — Discharge Instructions (Signed)

## 2014-07-26 NOTE — ED Notes (Signed)
Per Radiology pt had urine output while in testing.

## 2014-07-26 NOTE — ED Notes (Signed)
Critical Lab reported to EDP. Pt off the floor

## 2014-07-26 NOTE — Op Note (Signed)
Intra-operative fluoroscopic images in the AP, lateral, and oblique views were taken and evaluated by myself.  Removal of radio opaque foreign bodies was confirmed.  The fracture at the thumb proximal phalanx was confirmed to be non displaced.

## 2014-07-26 NOTE — Op Note (Signed)
[id number] 

## 2014-07-26 NOTE — Anesthesia Procedure Notes (Signed)
Procedure Name: Intubation Date/Time: 07/26/2014 6:51 PM Performed by: Wray Kearns A Pre-anesthesia Checklist: Patient identified, Timeout performed, Emergency Drugs available, Suction available and Patient being monitored Patient Re-evaluated:Patient Re-evaluated prior to inductionOxygen Delivery Method: Circle system utilized Preoxygenation: Pre-oxygenation with 100% oxygen Intubation Type: IV induction, Rapid sequence and Cricoid Pressure applied Laryngoscope Size: Mac and 3 Grade View: Grade I Tube type: Oral Tube size: 7.0 mm Number of attempts: 1 Airway Equipment and Method: Stylet Placement Confirmation: ETT inserted through vocal cords under direct vision,  breath sounds checked- equal and bilateral and positive ETCO2 Secured at: 20 cm Tube secured with: Tape Dental Injury: Teeth and Oropharynx as per pre-operative assessment

## 2014-07-26 NOTE — ED Notes (Addendum)
Cleaned patient wounds as much as she allowed me to clean it , she was complaining of a lot of pain on left arm and we cleaned with sterile saline and sterile gauze and applied wet to dry sterile gauze to area and wrapped with a large bandage, with Nurse Brandi assist.

## 2014-07-26 NOTE — Transfer of Care (Signed)
Immediate Anesthesia Transfer of Care Note  Patient: [name]  Procedure(s) Performed: Procedure(s): Irrigation, debridement and exploration of elbow wounds (Left)  Patient Location: PACU  Anesthesia Type:General  Level of Consciousness: oriented, sedated, patient cooperative and responds to stimulation  Airway & Oxygen Therapy: Patient Spontanous Breathing and Patient connected to nasal cannula oxygen  Post-op Assessment: Report given to PACU RN, Post -op Vital signs reviewed and stable, Patient moving all extremities and Patient moving all extremities X 4  Post vital signs: Reviewed and stable  Complications: No apparent anesthesia complications

## 2014-07-26 NOTE — ED Notes (Signed)
Nurse Johnella Moloney was informed of [name] OF 454.
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
## 2014-07-26 NOTE — ED Notes (Signed)
MD at bedside. 
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
## 2014-07-26 NOTE — Anesthesia Postprocedure Evaluation (Signed)
  Anesthesia Post-op Note  Patient: [name]  Procedure(s) Performed: Procedure(s): Irrigation, debridement and exploration of elbow wounds (Left)  Patient Location: PACU  Anesthesia Type:General  Level of Consciousness: awake and alert   Airway and Oxygen Therapy: Patient Spontanous Breathing  Post-op Pain: mild  Post-op Assessment: Post-op Vital signs reviewed  Post-op Vital Signs: stable  Last Vitals:  Filed Vitals:   07/26/14 2132  BP: 114/72  Pulse: 98  Temp:   Resp:     Complications: No apparent anesthesia complications
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
## 2014-07-26 NOTE — ED Notes (Signed)
Chem 8 results given to Dr. Pickering 

## 2014-07-26 NOTE — Anesthesia Preprocedure Evaluation (Signed)
Anesthesia Evaluation  Patient identified by MRN, date of birth, ID band Patient awake    Reviewed: Allergy & Precautions, H&P , NPO status , Patient's Chart, lab work & pertinent test results  Airway Mallampati: I  Neck ROM: Full    Dental  (+) Poor Dentition   Pulmonary Current Smoker,  breath sounds clear to auscultation        Cardiovascular Rhythm:Regular Rate:Normal     Neuro/Psych    GI/Hepatic   Endo/Other  diabetes, Poorly Controlled, Insulin Dependent  Renal/GU      Musculoskeletal   Abdominal   Peds  Hematology   Anesthesia Other Findings   Reproductive/Obstetrics                           Anesthesia Physical Anesthesia Plan  ASA: I  Anesthesia Plan: General   Post-op Pain Management:    Induction: Intravenous  Airway Management Planned: Oral ETT  Additional Equipment:   Intra-op Plan:   Post-operative Plan:   Informed Consent: I have reviewed the patients History and Physical, chart, labs and discussed the procedure including the risks, benefits and alternatives for the proposed anesthesia with the patient or authorized representative who has indicated his/her understanding and acceptance.   Dental advisory given  Plan Discussed with: CRNA and Surgeon  Anesthesia Plan Comments:         Anesthesia Quick Evaluation

## 2014-07-26 NOTE — ED Notes (Signed)
Pt returned to room  

## 2014-07-26 NOTE — ED Notes (Signed)
Hand Surgery at Bedside

## 2014-07-26 NOTE — Brief Op Note (Signed)
07/26/2014  8:35 PM  PATIENT:  [name]  28 y.o. female  PRE-OPERATIVE DIAGNOSIS:  arm wound  POST-OPERATIVE DIAGNOSIS:  arm wound  PROCEDURE:  Procedure(s): Irrigation, debridement and exploration of elbow wounds (Left)  SURGEON:  Surgeon(s) and Role:    * Betha Loa, MD - Primary  PHYSICIAN ASSISTANT:   ASSISTANTS: none   ANESTHESIA:   general  EBL:     BLOOD ADMINISTERED:none  DRAINS: vessel loop drains x 2  LOCAL MEDICATIONS USED:  MARCAINE     SPECIMEN:  No Specimen  DISPOSITION OF SPECIMEN:  N/A  COUNTS:  YES  TOURNIQUET:   Total Tourniquet Time Documented: Upper Arm (Left) - 73 minutes Total: Upper Arm (Left) - 73 minutes   DICTATION: .Other Dictation: Dictation Number [id number]  PLAN OF CARE: Discharge to home after PACU  PATIENT DISPOSITION:  PACU - hemodynamically stable.
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
## 2014-07-26 NOTE — Progress Notes (Signed)
Orthopedic Tech Progress Note Patient Details:  [name] 1986-04-05 [id number]  Ortho Devices Type of Ortho Device: Arm sling Ortho Device/Splint Location: lue Ortho Device/Splint Interventions: Ordered rn applied sling  Nikki Dom 07/26/2014, 9:17 PM

## 2014-07-28 ENCOUNTER — Encounter (HOSPITAL_COMMUNITY): Payer: Self-pay | Admitting: Orthopedic Surgery

## 2014-07-31 ENCOUNTER — Ambulatory Visit: Payer: Medicaid Other | Attending: Orthopedic Surgery | Admitting: Occupational Therapy

## 2014-07-31 DIAGNOSIS — M79643 Pain in unspecified hand: Secondary | ICD-10-CM | POA: Diagnosis present

## 2014-07-31 DIAGNOSIS — M25649 Stiffness of unspecified hand, not elsewhere classified: Secondary | ICD-10-CM | POA: Insufficient documentation

## 2014-08-18 ENCOUNTER — Encounter (HOSPITAL_COMMUNITY): Payer: Self-pay | Admitting: Orthopedic Surgery

## 2014-08-28 ENCOUNTER — Ambulatory Visit: Payer: Medicaid Other | Attending: Occupational Therapy | Admitting: Occupational Therapy

## 2014-08-28 DIAGNOSIS — M25649 Stiffness of unspecified hand, not elsewhere classified: Secondary | ICD-10-CM | POA: Insufficient documentation

## 2014-08-28 DIAGNOSIS — M79643 Pain in unspecified hand: Secondary | ICD-10-CM | POA: Insufficient documentation

## 2014-11-11 ENCOUNTER — Emergency Department (HOSPITAL_COMMUNITY)
Admission: EM | Admit: 2014-11-11 | Discharge: 2014-11-11 | Discharge status: Against Medical Advice | Payer: Medicaid Other | Attending: Emergency Medicine | Admitting: Emergency Medicine

## 2014-11-11 ENCOUNTER — Emergency Department (HOSPITAL_COMMUNITY): Payer: Medicaid Other

## 2014-11-11 ENCOUNTER — Encounter (HOSPITAL_COMMUNITY): Payer: Self-pay

## 2014-11-11 DIAGNOSIS — E119 Type 2 diabetes mellitus without complications: Secondary | ICD-10-CM | POA: Diagnosis not present

## 2014-11-11 DIAGNOSIS — S3992XA Unspecified injury of lower back, initial encounter: Secondary | ICD-10-CM | POA: Insufficient documentation

## 2014-11-11 DIAGNOSIS — Y998 Other external cause status: Secondary | ICD-10-CM | POA: Diagnosis not present

## 2014-11-11 DIAGNOSIS — Y9389 Activity, other specified: Secondary | ICD-10-CM | POA: Diagnosis not present

## 2014-11-11 DIAGNOSIS — S5012XA Contusion of left forearm, initial encounter: Secondary | ICD-10-CM | POA: Diagnosis not present

## 2014-11-11 DIAGNOSIS — Z720 Tobacco use: Secondary | ICD-10-CM | POA: Insufficient documentation

## 2014-11-11 DIAGNOSIS — S59912A Unspecified injury of left forearm, initial encounter: Secondary | ICD-10-CM | POA: Diagnosis present

## 2014-11-11 DIAGNOSIS — W108XXA Fall (on) (from) other stairs and steps, initial encounter: Secondary | ICD-10-CM | POA: Diagnosis not present

## 2014-11-11 DIAGNOSIS — Y9289 Other specified places as the place of occurrence of the external cause: Secondary | ICD-10-CM | POA: Diagnosis not present

## 2014-11-11 DIAGNOSIS — Z3202 Encounter for pregnancy test, result negative: Secondary | ICD-10-CM | POA: Insufficient documentation

## 2014-11-11 DIAGNOSIS — Z794 Long term (current) use of insulin: Secondary | ICD-10-CM | POA: Insufficient documentation

## 2014-11-11 DIAGNOSIS — Z792 Long term (current) use of antibiotics: Secondary | ICD-10-CM | POA: Diagnosis not present

## 2014-11-11 DIAGNOSIS — Z880 Allergy status to penicillin: Secondary | ICD-10-CM | POA: Diagnosis not present

## 2014-11-11 DIAGNOSIS — W19XXXA Unspecified fall, initial encounter: Secondary | ICD-10-CM

## 2014-11-11 LAB — URINE MICROSCOPIC-ADD ON

## 2014-11-11 LAB — URINALYSIS, ROUTINE W REFLEX MICROSCOPIC
BILIRUBIN URINE: NEGATIVE
GLUCOSE, UA: NEGATIVE mg/dL
Ketones, ur: NEGATIVE mg/dL
NITRITE: NEGATIVE
Protein, ur: NEGATIVE mg/dL
Specific Gravity, Urine: 1.015 (ref 1.005–1.030)
UROBILINOGEN UA: 0.2 mg/dL (ref 0.0–1.0)
pH: 6 (ref 5.0–8.0)

## 2014-11-11 LAB — PREGNANCY, URINE: PREG TEST UR: NEGATIVE
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
## 2014-11-11 NOTE — ED Notes (Addendum)
Pt states she slipped going down the steps, about 5. Pt says she tried to catch herself w/ arms. C/o left arm pain & also states pain to her buttocks when trying to sit. Pulses present, cap refill < 2 secs & good sensation in LUE.

## 2014-11-11 NOTE — ED Provider Notes (Signed)
CSN: [id number]     Arrival date & time 11/11/14  0516 History   First MD Initiated Contact with Patient 11/11/14 [phone]     Chief Complaint  Patient presents with  . Fall     HPI Pt was seen at 0735. Per pt, c/o sudden onset and resolution of one episode of slip and fall down steps that occurred 2 days ago. Pt states she fell onto her low back and buttocks, and "tried to catch myself with my elbows." Pt c/o low back/buttock pain, as well as left forearm pain. Pain worsens with palpation of the areas. Denies syncope, no head injury, no LOC/AMS, no neck pain, no focal motor weakness, no tingling/numbness in extremities, no incont/retention of bowel or bladder, no saddle anesthesia.    Past Medical History  Diagnosis Date  . Diabetes mellitus   . DKA 05/01/2011   Past Surgical History  Procedure Laterality Date  . Tubal ligation    . Wound exploration Left 07/26/2014    Procedure: Irrigation, debridement and exploration of elbow wounds;  Surgeon: Betha Loa, MD;  Location: [facility] OR;  Service: Orthopedics;  Laterality: Left;   Family History  Problem Relation Age of Onset  . Coronary artery disease Mother   . Lung cancer Mother   . Heart attack Father 29   History  Substance Use Topics  . Smoking status: Current Every Day Smoker -- 0.50 packs/day for 12 years    Types: Cigarettes  . Smokeless tobacco: Never Used     Comment: She is currently trying to quit.  . Alcohol Use: No   OB History    Gravida Para Term Preterm AB TAB SAB Ectopic Multiple Living   2 2             Review of Systems ROS: Statement: All systems negative except as marked or noted in the HPI; Constitutional: Negative for fever and chills. ; ; Eyes: Negative for eye pain, redness and discharge. ; ; ENMT: Negative for ear pain, hoarseness, nasal congestion, sinus pressure and sore throat. ; ; Cardiovascular: Negative for chest pain, palpitations, diaphoresis, dyspnea and peripheral edema. ; ; Respiratory: Negative  for cough, wheezing and stridor. ; ; Gastrointestinal: Negative for nausea, vomiting, diarrhea, abdominal pain, blood in stool, hematemesis, jaundice and rectal bleeding. . ; ; Genitourinary: Negative for dysuria, flank pain and hematuria. ; ; Musculoskeletal: +LBP, left forearm pain. Negative for neck pain. Negative for swelling and deformity.; ; Skin: Negative for pruritus, rash, abrasions, blisters, bruising and skin lesion.; ; Neuro: Negative for headache, lightheadedness and neck stiffness. Negative for weakness, altered level of consciousness , altered mental status, extremity weakness, paresthesias, involuntary movement, seizure and syncope.      Allergies  Codeine; Penicillins; and Tramadol  Home Medications   Prior to Admission medications   Medication Sig Start Date End Date Taking? Authorizing Provider  insulin aspart (NOVOLOG) 100 UNIT/ML injection Inject 2-4 Units into the skin 3 (three) times daily before meals. Use sliding scale with meals 3 times a day: If 150-250, inject 2 units If 250-350, inject 4 units 05/03/11  Yes Standley Brooking, MD  insulin glargine (LANTUS) 100 UNIT/ML injection Inject 40 Units into the skin at bedtime.   Yes Historical Provider, MD  omeprazole (PRILOSEC) 20 MG capsule Take 20 mg by mouth every other day.    Yes Historical Provider, MD  oxyCODONE-acetaminophen (PERCOCET) 5-325 MG per tablet 1-2 tabs po q6 hours prn pain 07/26/14   Betha Loa, MD  sulfamethoxazole-trimethoprim (BACTRIM DS) 800-160 MG per tablet Take 1 tablet by mouth 2 (two) times daily. 07/26/14   Betha Loa, MD   BP 113/81 mmHg  Pulse 88  Temp(Src) 97.8 F (36.6 C) (Oral)  Resp 10  Ht 5' (1.524 m)  Wt 108 lb (48.988 kg)  BMI 21.09 kg/m2  SpO2 98%  LMP 10/21/2014 Physical Exam 0740: Physical examination: Vital signs and O2 SAT: Reviewed; Constitutional: Well developed, Well nourished, Well hydrated, In no acute distress; Head and Face: Normocephalic, Atraumatic; Eyes: EOMI,  PERRL, No scleral icterus; ENMT: Mouth and pharynx normal, Left TM normal, Right TM normal, Mucous membranes moist; Neck: Supple, Trachea midline; Spine: +mild TTP bilat lumbar paraspinal muscles and buttocks. No abrasions or ecchymosis. No midline CS, TS, LS tenderness.; Cardiovascular: Regular rate and rhythm, No murmur, rub, or gallop; Respiratory: Breath sounds clear & equal bilaterally, No rales, rhonchi, wheezes, Normal respiratory effort/excursion; Chest: Nontender, No deformity, Movement normal, No crepitus, No abrasions or ecchymosis.; Abdomen: Soft, Nontender, Nondistended, Normal bowel sounds, No abrasions or ecchymosis.; Genitourinary: No CVA tenderness;; Extremities: No deformity, Full range of motion major/large joints of bilat UE's and LE's without pain or tenderness to palp, Neurovascularly intact, Pulses normal, +mild tenderness to left medial forearm with faint yellowing ecchymosis. Multiple well healed surgical scars, no open wounds. NT left elbow/wrist/hand. LUE forearm compartments soft, strong radial pulse. No edema, Pelvis stable; Neuro: AA&Ox3, GCS 15.  Major CN grossly intact. Speech clear. No gross focal motor or sensory deficits in extremities.; Skin: Color normal, Warm, Dry    ED Course  Procedures     EKG Interpretation None      MDM  MDM Reviewed: previous chart, nursing note and vitals Interpretation: x-ray and labs     0600:  Pt no longer in exam room or ED. Pt left before testing resulted.    Samuel Jester, DO 11/15/14 [phone]

## 2014-11-11 NOTE — ED Notes (Signed)
In to reeval pt. Pt not in room. EDP aware

## 2015-07-28 ENCOUNTER — Encounter (HOSPITAL_COMMUNITY): Payer: Self-pay | Admitting: Emergency Medicine

## 2015-07-28 ENCOUNTER — Emergency Department (HOSPITAL_COMMUNITY)
Admission: EM | Admit: 2015-07-28 | Discharge: 2015-07-28 | Discharge status: Against Medical Advice | Payer: Medicaid Other | Attending: Emergency Medicine | Admitting: Emergency Medicine

## 2015-07-28 DIAGNOSIS — Z720 Tobacco use: Secondary | ICD-10-CM | POA: Diagnosis not present

## 2015-07-28 DIAGNOSIS — M545 Low back pain: Secondary | ICD-10-CM | POA: Diagnosis not present

## 2015-07-28 DIAGNOSIS — R109 Unspecified abdominal pain: Secondary | ICD-10-CM | POA: Diagnosis not present

## 2015-07-28 DIAGNOSIS — E119 Type 2 diabetes mellitus without complications: Secondary | ICD-10-CM | POA: Insufficient documentation

## 2015-07-28 DIAGNOSIS — R300 Dysuria: Secondary | ICD-10-CM | POA: Diagnosis present

## 2015-07-28 LAB — I-STAT BETA HCG BLOOD, ED (MC, WL, AP ONLY)

## 2015-07-28 LAB — CBC
HCT: 45.2 % (ref 36.0–46.0)
HEMOGLOBIN: 14.9 g/dL (ref 12.0–15.0)
MCH: 27.2 pg (ref 26.0–34.0)
MCHC: 33 g/dL (ref 30.0–36.0)
MCV: 82.6 fL (ref 78.0–100.0)
Platelets: 256 10*3/uL (ref 150–400)
RBC: 5.47 MIL/uL — ABNORMAL HIGH (ref 3.87–5.11)
RDW: 17.5 % — ABNORMAL HIGH (ref 11.5–15.5)
WBC: 10.3 10*3/uL (ref 4.0–10.5)

## 2015-07-28 LAB — URINE MICROSCOPIC-ADD ON

## 2015-07-28 LAB — COMPREHENSIVE METABOLIC PANEL
ALK PHOS: 78 U/L (ref 38–126)
ALT: 16 U/L (ref 14–54)
ANION GAP: 10 (ref 5–15)
AST: 22 U/L (ref 15–41)
Albumin: 3.9 g/dL (ref 3.5–5.0)
BILIRUBIN TOTAL: 0.4 mg/dL (ref 0.3–1.2)
BUN: 19 mg/dL (ref 6–20)
CALCIUM: 9.2 mg/dL (ref 8.9–10.3)
CO2: 27 mmol/L (ref 22–32)
Chloride: 97 mmol/L — ABNORMAL LOW (ref 101–111)
Creatinine, Ser: 0.63 mg/dL (ref 0.44–1.00)
GFR calc Af Amer: >60 mL/min (ref >60)
GFR calc non Af Amer: >60 mL/min (ref >60)
Glucose, Bld: 226 mg/dL — ABNORMAL HIGH (ref 65–99)
Potassium: 3.4 mmol/L — ABNORMAL LOW (ref 3.5–5.1)
SODIUM: 134 mmol/L — AB (ref 135–145)
TOTAL PROTEIN: 8.6 g/dL — AB (ref 6.5–8.1)

## 2015-07-28 LAB — URINALYSIS, ROUTINE W REFLEX MICROSCOPIC
Glucose, UA: >1000 mg/dL — AB
Ketones, ur: 15 mg/dL — AB
NITRITE: NEGATIVE
PROTEIN: 30 mg/dL — AB
Specific Gravity, Urine: 1.026 (ref 1.005–1.030)
UROBILINOGEN UA: 1 mg/dL (ref 0.0–1.0)
pH: 5.5 (ref 5.0–8.0)

## 2015-07-28 LAB — LIPASE, BLOOD: Lipase: 14 U/L — ABNORMAL LOW (ref 22–51)

## 2015-07-28 NOTE — ED Notes (Signed)
Pt from home for eval of dysuria, abd and lower back pain x3 days. Pt states hx of UTI and admission for kidney infection x6 months ago and feels this is the same. Denies any vaginal discharge or odor. nad noted, pt alert and oriented, skin warm and dry.

## 2015-10-06 ENCOUNTER — Encounter (HOSPITAL_COMMUNITY): Payer: Self-pay | Admitting: Emergency Medicine

## 2015-10-06 ENCOUNTER — Emergency Department (HOSPITAL_COMMUNITY)
Admission: EM | Admit: 2015-10-06 | Discharge: 2015-10-06 | Disposition: A | Discharge status: Home / Self Care | Payer: Medicaid Other | Attending: Emergency Medicine | Admitting: Emergency Medicine

## 2015-10-06 ENCOUNTER — Emergency Department (HOSPITAL_COMMUNITY): Payer: Medicaid Other

## 2015-10-06 DIAGNOSIS — R0789 Other chest pain: Secondary | ICD-10-CM | POA: Insufficient documentation

## 2015-10-06 DIAGNOSIS — F17210 Nicotine dependence, cigarettes, uncomplicated: Secondary | ICD-10-CM | POA: Diagnosis not present

## 2015-10-06 DIAGNOSIS — E10649 Type 1 diabetes mellitus with hypoglycemia without coma: Secondary | ICD-10-CM | POA: Diagnosis not present

## 2015-10-06 DIAGNOSIS — R0602 Shortness of breath: Secondary | ICD-10-CM | POA: Diagnosis present

## 2015-10-06 DIAGNOSIS — E162 Hypoglycemia, unspecified: Secondary | ICD-10-CM

## 2015-10-06 DIAGNOSIS — Z794 Long term (current) use of insulin: Secondary | ICD-10-CM | POA: Diagnosis not present

## 2015-10-06 DIAGNOSIS — R05 Cough: Secondary | ICD-10-CM

## 2015-10-06 DIAGNOSIS — R69 Illness, unspecified: Secondary | ICD-10-CM

## 2015-10-06 DIAGNOSIS — Z880 Allergy status to penicillin: Secondary | ICD-10-CM | POA: Diagnosis not present

## 2015-10-06 DIAGNOSIS — R059 Cough, unspecified: Secondary | ICD-10-CM

## 2015-10-06 DIAGNOSIS — J111 Influenza due to unidentified influenza virus with other respiratory manifestations: Secondary | ICD-10-CM | POA: Diagnosis not present

## 2015-10-06 LAB — CBC WITH DIFFERENTIAL/PLATELET
BASOS ABS: 0 10*3/uL (ref 0.0–0.1)
BASOS PCT: 0 %
EOS PCT: 0 %
Eosinophils Absolute: 0 10*3/uL (ref 0.0–0.7)
HCT: 39.6 % (ref 36.0–46.0)
Hemoglobin: 12.7 g/dL (ref 12.0–15.0)
LYMPHS PCT: 8 %
Lymphs Abs: 1.4 10*3/uL (ref 0.7–4.0)
MCH: 28 pg (ref 26.0–34.0)
MCHC: 32.1 g/dL (ref 30.0–36.0)
MCV: 87.2 fL (ref 78.0–100.0)
MONO ABS: 0.7 10*3/uL (ref 0.1–1.0)
Monocytes Relative: 4 %
Neutro Abs: 15.6 10*3/uL — ABNORMAL HIGH (ref 1.7–7.7)
Neutrophils Relative %: 88 %
PLATELETS: 282 10*3/uL (ref 150–400)
RBC: 4.54 MIL/uL (ref 3.87–5.11)
RDW: 15.5 % (ref 11.5–15.5)
WBC: 17.8 10*3/uL — ABNORMAL HIGH (ref 4.0–10.5)

## 2015-10-06 LAB — CBG MONITORING, ED
GLUCOSE-CAPILLARY: 181 mg/dL — AB (ref 65–99)
Glucose-Capillary: 36 mg/dL — CL (ref 65–99)
Glucose-Capillary: 117 mg/dL — ABNORMAL HIGH (ref 65–99)

## 2015-10-06 LAB — COMPREHENSIVE METABOLIC PANEL
ALT: 12 U/L — ABNORMAL LOW (ref 14–54)
AST: 19 U/L (ref 15–41)
Albumin: 3.4 g/dL — ABNORMAL LOW (ref 3.5–5.0)
Alkaline Phosphatase: 78 U/L (ref 38–126)
Anion gap: 9 (ref 5–15)
BUN: 18 mg/dL (ref 6–20)
CHLORIDE: 103 mmol/L (ref 101–111)
CO2: 26 mmol/L (ref 22–32)
Calcium: 8.8 mg/dL — ABNORMAL LOW (ref 8.9–10.3)
Creatinine, Ser: 0.54 mg/dL (ref 0.44–1.00)
GFR calc Af Amer: >60 mL/min (ref >60)
Glucose, Bld: 184 mg/dL — ABNORMAL HIGH (ref 65–99)
POTASSIUM: 3.4 mmol/L — AB (ref 3.5–5.1)
Sodium: 138 mmol/L (ref 135–145)
Total Bilirubin: 0.3 mg/dL (ref 0.3–1.2)
Total Protein: 6.8 g/dL (ref 6.5–8.1)

## 2015-10-06 LAB — I-STAT BETA HCG BLOOD, ED (MC, WL, AP ONLY): HCG, QUANTITATIVE: 9.2 m[IU]/mL — AB (ref <5)

## 2015-10-06 LAB — URINALYSIS, ROUTINE W REFLEX MICROSCOPIC
BILIRUBIN URINE: NEGATIVE
Glucose, UA: 100 mg/dL — AB
KETONES UR: NEGATIVE mg/dL
Nitrite: NEGATIVE
PROTEIN: NEGATIVE mg/dL
Specific Gravity, Urine: 1.02 (ref 1.005–1.030)
pH: 8 (ref 5.0–8.0)

## 2015-10-06 LAB — URINE MICROSCOPIC-ADD ON

## 2015-10-06 LAB — I-STAT CG4 LACTIC ACID, ED
LACTIC ACID, VENOUS: 1.37 mmol/L (ref 0.5–2.0)
LACTIC ACID, VENOUS: 0.44 mmol/L — AB (ref 0.5–2.0)

## 2015-10-06 MED ORDER — IBUPROFEN 800 MG PO TABS
800.0000 mg | ORAL_TABLET | Freq: Once | ORAL | Status: AC
  Administered 2015-10-06: 800 mg via ORAL
  Filled 2015-10-06: qty 1

## 2015-10-06 NOTE — ED Notes (Signed)
Patient requested that sugar be checked. When tech arrived with glucometer, patient stated that she had "already done it  and it was fine"

## 2015-10-06 NOTE — ED Notes (Signed)
CBG 36 

## 2015-10-06 NOTE — ED Notes (Signed)
CBG 117 

## 2015-10-06 NOTE — ED Notes (Addendum)
Pt given orange juice, graham crackers and a [place] sandwich.

## 2015-10-06 NOTE — Discharge Instructions (Signed)
Cough, Adult A cough helps to clear your throat and lungs. A cough may last only 2-3 weeks (acute), or it may last longer than 8 weeks (chronic). Many different things can cause a cough. A cough may be a sign of an illness or another medical condition. HOME CARE  Pay attention to any changes in your cough.  Take medicines only as told by your doctor.  If you were prescribed an antibiotic medicine, take it as told by your doctor. Do not stop taking it even if you start to feel better.  Talk with your doctor before you try using a cough medicine.  Drink enough fluid to keep your pee (urine) clear or pale yellow.  If the air is dry, use a cold steam vaporizer or humidifier in your home.  Stay away from things that make you cough at work or at home.  If your cough is worse at night, try using extra pillows to raise your head up higher while you sleep.  Do not smoke, and try not to be around smoke. If you need help quitting, ask your doctor.  Do not have caffeine.  Do not drink alcohol.  Rest as needed. GET HELP IF:  You have new problems (symptoms).  You cough up yellow fluid (pus).  Your cough does not get better after 2-3 weeks, or your cough gets worse.  Medicine does not help your cough and you are not sleeping well.  You have pain that gets worse or pain that is not helped with medicine.  You have a fever.  You are losing weight and you do not know why.  You have night sweats. GET HELP RIGHT AWAY IF:  You cough up blood.  You have trouble breathing.  Your heartbeat is very fast.   This information is not intended to replace advice given to you by your health care provider. Make sure you discuss any questions you have with your health care provider.   Document Released: 06/16/2011 Document Revised: 06/24/2015 Document Reviewed: 12/10/2014 Elsevier Interactive Patient Education 2016 Elsevier Inc[employer]  Hypoglycemia Low blood sugar (hypoglycemia) means that the  level of sugar in your blood is lower than it should be. Signs of low blood sugar include:  Getting sweaty.  Feeling hungry.  Feeling dizzy or weak.  Feeling sleepier than normal.  Feeling nervous.  Headaches.  Having a fast heartbeat. Low blood sugar can happen fast and can be an emergency. Your doctor can do tests to check your blood sugar level. You can have low blood sugar and not have diabetes. HOME CARE  Check your blood sugar as told by your doctor. If it is less than 70 mg/dl or as told by your doctor, take 1 of the following:  3 to 4 glucose tablets.   cup clear juice.   cup soda pop, not diet.  1 cup milk.  5 to 6 hard candies.  Recheck blood sugar after 15 minutes. Repeat until it is at the right level.  Eat a snack if it is more than 1 hour until the next meal.  Only take medicine as told by your doctor.  Do not skip meals. Eat on time.  Do not drink alcohol except with meals.  Check your blood glucose before driving.  Check your blood glucose before and after exercise.  Always carry treatment with you, such as glucose pills.  Always wear a medical alert bracelet if you have diabetes. GET HELP RIGHT AWAY IF:   Your blood glucose  goes below 70 mg/dl or as told by your doctor, and you:  Are confused.  Are not able to swallow.  Pass out (faint).  You cannot treat yourself. You may need someone to help you.  You have low blood sugar problems often.  You have problems from your medicines.  You are not feeling better after 3 to 4 days.  You have vision changes. MAKE SURE YOU:   Understand these instructions.  Will watch this condition.  Will get help right away if you are not doing well or get worse.   This information is not intended to replace advice given to you by your health care provider. Make sure you discuss any questions you have with your health care provider.   Document Released: 12/28/2009 Document Revised: 10/24/2014  Document Reviewed: 06/09/2015 Elsevier Interactive Patient Education 2016 Elsevier Inc.  Shortness of Breath Shortness of breath means you have trouble breathing. Shortness of breath needs medical care right away. HOME CARE   Do not smoke.  Avoid being around chemicals or things (paint fumes, dust) that may bother your breathing.  Rest as needed. Slowly begin your normal activities.  Only take medicines as told by your doctor.  Keep all doctor visits as told. GET HELP RIGHT AWAY IF:   Your shortness of breath gets worse.  You feel lightheaded, pass out (faint), or have a cough that is not helped by medicine.  You cough up blood.  You have pain with breathing.  You have pain in your chest, arms, shoulders, or belly (abdomen).  You have a fever.  You cannot walk up stairs or exercise the way you normally do.  You do not get better in the time expected.  You have a hard time doing normal activities even with rest.  You have problems with your medicines.  You have any new symptoms. MAKE SURE YOU:  Understand these instructions.  Will watch your condition.  Will get help right away if you are not doing well or get worse.   This information is not intended to replace advice given to you by your health care provider. Make sure you discuss any questions you have with your health care provider.   Document Released: 03/21/2008 Document Revised: 10/08/2013 Document Reviewed: 12/19/2011 Elsevier Interactive Patient Education 2016 Elsevier Inc.  Viral Infections A viral infection can be caused by different types of viruses.Most viral infections are not serious and resolve on their own. However, some infections may cause severe symptoms and may lead to further complications. SYMPTOMS Viruses can frequently cause:  Minor sore throat.  Aches and pains.  Headaches.  Runny nose.  Different types of rashes.  Watery eyes.  Tiredness.  Cough.  Loss of  appetite.  Gastrointestinal infections, resulting in nausea, vomiting, and diarrhea. These symptoms do not respond to antibiotics because the infection is not caused by bacteria. However, you might catch a bacterial infection following the viral infection. This is sometimes called a "superinfection." Symptoms of such a bacterial infection may include:  Worsening sore throat with pus and difficulty swallowing.  Swollen neck glands.  Chills and a high or persistent fever.  Severe headache.  Tenderness over the sinuses.  Persistent overall ill feeling (malaise), muscle aches, and tiredness (fatigue).  Persistent cough.  Yellow, green, or brown mucus production with coughing. HOME CARE INSTRUCTIONS   Only take over-the-counter or prescription medicines for pain, discomfort, diarrhea, or fever as directed by your caregiver.  Drink enough water and fluids to keep your urine  clear or pale yellow. Sports drinks can provide valuable electrolytes, sugars, and hydration.  Get plenty of rest and maintain proper nutrition. Soups and broths with crackers or rice are fine. SEEK IMMEDIATE MEDICAL CARE IF:   You have severe headaches, shortness of breath, chest pain, neck pain, or an unusual rash.  You have uncontrolled vomiting, diarrhea, or you are unable to keep down fluids.  You or your child has an oral temperature above 102 F (38.9 C), not controlled by medicine.  Your baby is older than 3 months with a rectal temperature of 102 F (38.9 C) or higher.  Your baby is 83 months old or younger with a rectal temperature of 100.4 F (38 C) or higher. MAKE SURE YOU:   Understand these instructions.  Will watch your condition.  Will get help right away if you are not doing well or get worse.   This information is not intended to replace advice given to you by your health care provider. Make sure you discuss any questions you have with your health care provider.   Document Released:  07/13/2005 Document Revised: 12/26/2011 Document Reviewed: 03/11/2015 Elsevier Interactive Patient Education [employer]

## 2015-10-06 NOTE — ED Provider Notes (Signed)
CSN: [id number]     Arrival date & time 10/06/15  1519 History   First MD Initiated Contact with Patient 10/06/15 1815     Chief Complaint  Patient presents with  . Hypoglycemia  . Generalized Body Aches     (Consider location/radiation/quality/duration/timing/severity/associated sxs/prior Treatment) Patient is a 29 y.o. female presenting with hypoglycemia. The history is provided by the patient.  Hypoglycemia Initial blood sugar:  181 Severity:  Mild Onset quality:  Gradual Timing:  Intermittent Progression:  Waxing and waning Chronicity:  Recurrent Diabetic status:  Controlled with insulin Current diabetic therapy:  Insulin Context: recent illness   Context: not decreased oral intake, not diet changes, not exercise, not ingestion, not new diabetes diagnosis and not treatment noncompliance   Relieved by:  Eating Ineffective treatments:  None tried Associated symptoms: shortness of breath   Associated symptoms: no altered mental status, no anxiety, no decreased responsiveness, no dizziness, no obesity, no seizures, no speech difficulty, no sweats, no tremors, no visual change and no vomiting   Risk factors: no cancer and no uncontrolled diabetes     Past Medical History  Diagnosis Date  . Diabetes mellitus   . DKA 05/01/2011   Past Surgical History  Procedure Laterality Date  . Tubal ligation    . Wound exploration Left 07/26/2014    Procedure: Irrigation, debridement and exploration of elbow wounds;  Surgeon: Betha Loa, MD;  Location: [facility] OR;  Service: Orthopedics;  Laterality: Left;   Family History  Problem Relation Age of Onset  . Coronary artery disease Mother   . Lung cancer Mother   . Heart attack Father 60   Social History  Substance Use Topics  . Smoking status: Current Every Day Smoker -- 0.50 packs/day for 12 years    Types: Cigarettes  . Smokeless tobacco: Never Used     Comment: She is currently trying to quit.  . Alcohol Use: No   OB History    Gravida Para Term Preterm AB TAB SAB Ectopic Multiple Living   2 2             Review of Systems  Constitutional: Negative for diaphoresis and decreased responsiveness.  HENT: Positive for congestion, rhinorrhea and sinus pressure. Negative for ear pain.   Eyes: Negative for pain.  Respiratory: Positive for cough, chest tightness and shortness of breath.   Cardiovascular: Negative for chest pain.  Gastrointestinal: Negative for nausea, vomiting, diarrhea and constipation.  Genitourinary: Negative for dysuria, vaginal bleeding, vaginal discharge and difficulty urinating.  Musculoskeletal: Positive for myalgias. Negative for back pain, joint swelling, arthralgias and gait problem.  Skin: Negative for rash and wound.  Neurological: Positive for headaches. Negative for dizziness, tremors, seizures and speech difficulty.  Psychiatric/Behavioral: The patient is not nervous/anxious.       Allergies  Penicillins; Codeine; and Tramadol  Home Medications   Prior to Admission medications   Medication Sig Start Date End Date Taking? Authorizing Provider  acetaminophen (TYLENOL) 325 MG tablet Take 650 mg by mouth every 6 (six) hours as needed for mild pain.   Yes Historical Provider, MD  insulin aspart (NOVOLOG) 100 UNIT/ML injection Inject 2-4 Units into the skin 3 (three) times daily before meals. Use sliding scale with meals 3 times a day: If 150-250, inject 2 units If 250-350, inject 4 units 05/03/11  Yes Standley Brooking, MD  insulin glargine (LANTUS) 100 UNIT/ML injection Inject 40 Units into the skin at bedtime.   Yes Historical Provider, MD  Pseudoeph-CPM-DM-APAP (TYLENOL  COLD PO) Take 2 capsules by mouth daily as needed (cold / congestion).   Yes Historical Provider, MD   BP 106/84 mmHg  Pulse 79  Temp(Src) 97.9 F (36.6 C) (Oral)  Resp 18  SpO2 97%  LMP 09/27/2015 Physical Exam  Constitutional: She does not appear ill. No distress.  HENT:  Head: Head is without abrasion and  without contusion.  Right Ear: Hearing, tympanic membrane, external ear and ear canal normal.  Left Ear: Hearing, tympanic membrane, external ear and ear canal normal.  Nose: No rhinorrhea.  Mouth/Throat: Oropharynx is clear and moist and mucous membranes are normal.  Neck: Normal range of motion. Neck supple.  Cardiovascular: Normal rate and regular rhythm.   Pulmonary/Chest: Effort normal and breath sounds normal. No accessory muscle usage. No tachypnea. She has no wheezes. She has no rhonchi. She has no rales.  Abdominal: Normal appearance and bowel sounds are normal. She exhibits no fluid wave and no ascites. There is no tenderness. There is no rigidity, no guarding, no CVA tenderness and no tenderness at McBurney's point.  Neurological: She is alert. She has normal strength. No cranial nerve deficit or sensory deficit. GCS eye subscore is 4. GCS verbal subscore is 5. GCS motor subscore is 6.  Skin: No rash noted.  Psychiatric: She has a normal mood and affect. Her speech is normal and behavior is normal.    ED Course  Procedures (including critical care time) Labs Review Labs Reviewed  CBC WITH DIFFERENTIAL/PLATELET - Abnormal; Notable for the following:    WBC 17.8 (*)    Neutro Abs 15.6 (*)    All other components within normal limits  COMPREHENSIVE METABOLIC PANEL - Abnormal; Notable for the following:    Potassium 3.4 (*)    Glucose, Bld 184 (*)    Calcium 8.8 (*)    Albumin 3.4 (*)    ALT 12 (*)    All other components within normal limits  URINALYSIS, ROUTINE W REFLEX MICROSCOPIC (NOT AT [facility]) - Abnormal; Notable for the following:    APPearance CLOUDY (*)    Glucose, UA 100 (*)    Hgb urine dipstick LARGE (*)    Leukocytes, UA MODERATE (*)    All other components within normal limits  URINE MICROSCOPIC-ADD ON - Abnormal; Notable for the following:    Squamous Epithelial / LPF 6-30 (*)    Bacteria, UA MANY (*)    All other components within normal limits  CBG  MONITORING, ED - Abnormal; Notable for the following:    Glucose-Capillary 181 (*)    All other components within normal limits  I-STAT BETA HCG BLOOD, ED ([facility], [facility], [facility] ONLY) - Abnormal; Notable for the following:    I-stat hCG, quantitative 9.2 (*)    All other components within normal limits  I-STAT CG4 LACTIC ACID, ED - Abnormal; Notable for the following:    Lactic Acid, Venous 0.44 (*)    All other components within normal limits  CBG MONITORING, ED - Abnormal; Notable for the following:    Glucose-Capillary 36 (*)    All other components within normal limits  CBG MONITORING, ED - Abnormal; Notable for the following:    Glucose-Capillary 117 (*)    All other components within normal limits  URINE CULTURE  I-STAT CG4 LACTIC ACID, ED    Imaging Review Dg Chest 2 View  10/06/2015  CLINICAL DATA:  Patient with cough and chest pain. EXAM: CHEST  2 VIEW COMPARISON:  Chest radiograph 07/26/2014. FINDINGS:  Normal cardiac and mediastinal contours. No consolidative pulmonary opacities. No pleural effusion or pneumothorax. Regional skeleton is unremarkable. IMPRESSION: No active cardiopulmonary disease. Electronically Signed   By: Annia Belt M.D.   On: 10/06/2015 15:51   I have personally reviewed and evaluated these images and lab results as part of my medical decision-making.   EKG Interpretation None      MDM   Final diagnoses:  Cough  SOB (shortness of breath)  Influenza-like illness  Hypoglycemia    29 year old Caucasian female past medical history of type 1 diabetes presents to the setting of 3 days of cough, subjective fevers, myalgias, headache. Patient reports service time she's had bouts of hypoglycemia. She denies any vision changes, trauma, chest pain, nausea, vomiting, cough , diarrhea, dysuria, vaginal bleeding, vaginal discharge, rashes, recent sick contacts, recent travel. Patient reports she had her normal menstrual cycle for approximately one week ago.  In triage  patient had blood sugar checked which was within normal limits. She additionally had laboratory analysis performed which showed an elevated white blood cell count but no signs of anemia or significant electrolyte abnormalities. Chest x-ray performed which I personally reviewed which reveals no acute cardiopulmonary and no signs of pneumonia. On examination patient was resting comfortably with oxygen saturations within normal limits on room air. No wheezes or rhonchi bilaterally. No meningeal signs. Patient was afebrile on arrival.  U/A revealed revealed no signs of UTI.  Pt had repeat blood sugar which was decreased.  Pt given sandwich and had improvement in glucose.  Likely related to illness and decreased PO intake with no change in insulin dosage.  In setting of patient's history and elevated white blood cell count this is likely influenza-like illness. Patient given NSAID in emergency department for myalgias and reported improvement in symptoms.  Will be discharged home at this time. Patient advised to continue to monitor blood sugar as well as to decrease smoking and increase hydration. Patient advised follow-up with PCP in 2-3 days for reevaluation. Patient stable at time of discharge and in agreement with plan.  Attending has seen and available patient Dr. Denton Lank is in agreement with plan.    Stacy Gardner, MD 10/06/15 [id number]  Cathren Laine, MD 10/12/15 [phone]

## 2015-10-06 NOTE — ED Notes (Signed)
MD Steinl at the bedside  

## 2015-10-06 NOTE — ED Notes (Signed)
Pt sts hypoglycemia starting yesterday; pt sts DM; pt sts body aches

## 2015-10-06 NOTE — ED Notes (Signed)
POCT CBG resulted 181; Shanda Bumps, RN

## 2015-10-10 LAB — URINE CULTURE: Culture: >=100000

## 2015-10-11 ENCOUNTER — Telehealth (HOSPITAL_COMMUNITY): Payer: Self-pay

## 2015-10-11 NOTE — Telephone Encounter (Signed)
Post ED Visit - Positive Culture Follow-up: Successful Patient Follow-Up  Culture assessed and recommendations reviewed by: []  Enzo Bi, Pharm.D. []  Celedonio Miyamoto, Pharm.D., BCPS []  Garvin Fila, Pharm.D. []  Georgina Pillion, [address].D., BCPS []  [place], [address].D., BCPS, AAHIVP []  Estella Husk, Pharm.D., BCPS, AAHIVP []  Tennis Must, Pharm.D. []  Sherle Poe, [address].DLanetta Inch Masters, Pharm.D.  Positive urine culture, >/= 100,000 colonies -> Group B Strep & Staph. Lugdunensis  [x]  Patient discharged without antimicrobial prescription and treatment is now indicated []  Organism is resistant to prescribed ED discharge antimicrobial []  Patient with positive blood cultures  Changes discussed with ED provider: Rexene Edison Muthersbaugh PA  New antibiotic prescription:  if pregnant "Clindamycin 300 mg BID x 7 days" If not pregnant "Bactrim DS 1 BID x 3 days"  Called to   Contacted patient, date 10/11/2015, time 20:54 Letter sent to EPIC address   [name] 10/11/2015, 8:43 PM

## 2015-11-14 ENCOUNTER — Telehealth (HOSPITAL_COMMUNITY): Payer: Self-pay

## 2015-11-14 NOTE — Telephone Encounter (Signed)
Unable to contact pt by mail or telephone. Unable to communicate lab results or treatment changes. 

## 2015-12-15 ENCOUNTER — Encounter (INDEPENDENT_AMBULATORY_CARE_PROVIDER_SITE_OTHER): Payer: Self-pay

## 2016-01-04 ENCOUNTER — Emergency Department (HOSPITAL_COMMUNITY): Payer: Medicaid Other

## 2016-01-04 ENCOUNTER — Encounter (HOSPITAL_COMMUNITY): Payer: Self-pay | Admitting: Emergency Medicine

## 2016-01-04 DIAGNOSIS — K5900 Constipation, unspecified: Secondary | ICD-10-CM | POA: Insufficient documentation

## 2016-01-04 DIAGNOSIS — E119 Type 2 diabetes mellitus without complications: Secondary | ICD-10-CM | POA: Diagnosis not present

## 2016-01-04 DIAGNOSIS — F17210 Nicotine dependence, cigarettes, uncomplicated: Secondary | ICD-10-CM | POA: Diagnosis not present

## 2016-01-04 DIAGNOSIS — Z794 Long term (current) use of insulin: Secondary | ICD-10-CM | POA: Diagnosis not present

## 2016-01-04 NOTE — ED Notes (Signed)
Pt states she has been unable to have a good BM for a month and is now having abd pain with indigestion

## 2016-01-05 ENCOUNTER — Emergency Department (HOSPITAL_COMMUNITY)
Admission: EM | Admit: 2016-01-05 | Discharge: 2016-01-05 | Discharge status: Against Medical Advice | Payer: Medicaid Other | Attending: Emergency Medicine | Admitting: Emergency Medicine

## 2016-01-05 ENCOUNTER — Emergency Department (HOSPITAL_COMMUNITY): Payer: Medicaid Other

## 2016-01-05 DIAGNOSIS — K5900 Constipation, unspecified: Secondary | ICD-10-CM

## 2016-01-05 LAB — COMPREHENSIVE METABOLIC PANEL
ALBUMIN: 4.2 g/dL (ref 3.5–5.0)
ALK PHOS: 84 U/L (ref 38–126)
ALT: 19 U/L (ref 14–54)
AST: 16 U/L (ref 15–41)
Anion gap: 7 (ref 5–15)
BUN: 11 mg/dL (ref 6–20)
CALCIUM: 9.3 mg/dL (ref 8.9–10.3)
CO2: 29 mmol/L (ref 22–32)
CREATININE: 0.61 mg/dL (ref 0.44–1.00)
Chloride: 99 mmol/L — ABNORMAL LOW (ref 101–111)
GFR calc Af Amer: >60 mL/min (ref >60)
GFR calc non Af Amer: >60 mL/min (ref >60)
GLUCOSE: 315 mg/dL — AB (ref 65–99)
Potassium: 3.9 mmol/L (ref 3.5–5.1)
SODIUM: 135 mmol/L (ref 135–145)
Total Bilirubin: 0.4 mg/dL (ref 0.3–1.2)
Total Protein: 8.3 g/dL — ABNORMAL HIGH (ref 6.5–8.1)

## 2016-01-05 LAB — URINALYSIS, ROUTINE W REFLEX MICROSCOPIC
Glucose, UA: >1000 mg/dL — AB
Ketones, ur: NEGATIVE mg/dL
Leukocytes, UA: NEGATIVE
Nitrite: NEGATIVE
Protein, ur: NEGATIVE mg/dL
Specific Gravity, Urine: 1.01 (ref 1.005–1.030)
pH: 6 (ref 5.0–8.0)

## 2016-01-05 LAB — CBC WITH DIFFERENTIAL/PLATELET
BASOS PCT: 0 %
Basophils Absolute: 0 10*3/uL (ref 0.0–0.1)
EOS ABS: 0.1 10*3/uL (ref 0.0–0.7)
Eosinophils Relative: 0 %
HEMATOCRIT: 42.3 % (ref 36.0–46.0)
Hemoglobin: 14.3 g/dL (ref 12.0–15.0)
LYMPHS ABS: 4.1 10*3/uL — AB (ref 0.7–4.0)
Lymphocytes Relative: 34 %
MCH: 28.3 pg (ref 26.0–34.0)
MCHC: 33.8 g/dL (ref 30.0–36.0)
MCV: 83.8 fL (ref 78.0–100.0)
MONO ABS: 1 10*3/uL (ref 0.1–1.0)
MONOS PCT: 8 %
Neutro Abs: 7 10*3/uL (ref 1.7–7.7)
Neutrophils Relative %: 58 %
Platelets: 233 10*3/uL (ref 150–400)
RBC: 5.05 MIL/uL (ref 3.87–5.11)
RDW: 17.2 % — AB (ref 11.5–15.5)
WBC: 12.1 10*3/uL — ABNORMAL HIGH (ref 4.0–10.5)

## 2016-01-05 LAB — CBG MONITORING, ED: GLUCOSE-CAPILLARY: 258 mg/dL — AB (ref 65–99)

## 2016-01-05 LAB — URINE MICROSCOPIC-ADD ON

## 2016-01-05 LAB — POC URINE PREG, ED: Preg Test, Ur: NEGATIVE

## 2016-01-05 MED ORDER — SODIUM CHLORIDE 0.9 % IV BOLUS (SEPSIS)
1000.0000 mL | Freq: Once | INTRAVENOUS | Status: AC
  Administered 2016-01-05: 1000 mL via INTRAVENOUS

## 2016-01-05 MED ORDER — PEG 3350-KCL-NA BICARB-NACL 420 G PO SOLR
4000.0000 mL | Freq: Once | ORAL | Status: AC
  Administered 2016-01-05: 4000 mL via ORAL
  Filled 2016-01-05: qty 4000

## 2016-01-05 NOTE — ED Notes (Signed)
When trying to obtain vitals patient would not stop texting. Patient states that she is not able to drink anymore of the prep. Patient exited out the back door at this time.

## 2016-01-05 NOTE — ED Notes (Signed)
Pt removed own IV after drinking ~1/2 of the GoLytely and walked out of ED.

## 2016-01-05 NOTE — ED Provider Notes (Signed)
CSN: [id number]     Arrival date & time 01/04/16  2128 History  By signing my name below, I, Soijett Blue, attest that this documentation has been prepared under the direction and in the presence of Devoria Albe, MD at [phone]. Electronically Signed: Soijett Blue, ED Scribe. 01/05/2016. 1:08 AM.   Chief Complaint  Patient presents with  . Constipation  . Abdominal Pain      The history is provided by the patient. No language interpreter was used.    HPI Comments: [name] is a 30 y.o. female with a medical hx of DM who presents to the Emergency Department complaining of constant, moderate, constipation onset 1 month. She notes that she has not had a BM in 1 month. Pt has had issues before for constipation in the past and she notes that she normally cannot have a bowel movement without laxatives. Pt has been taking laxatives for the past two years. Pt denies any Rx narcotic use. Pt reports that today when she burps, it smells like rotten eggs. Pt notes that when she eats, she is immediately full. She is also b/o reflux with burning.   She states that she is having associated symptoms of abdominal pain, fever of 101 yesterday relieved with tylenol, and abdominal distension. She states that she has tried OTC women's laxative twice a day for 2 days, stool softener, and miralax twice a day taken yesterday and 3 days ago with no relief of her symptoms. She denies appetite change and any other symptoms. She reports normal appetite. She also reports her CBG monitor has been  reading high today.    Pt PCP is Dr. Loney Hering and she only sees him once a year. Pt smokes almost 1 PPD, but denies any drinking alcohol. Pt has had a tubal ligation.  PCP Dr Loney Hering   Past Medical History  Diagnosis Date  . Diabetes mellitus   . DKA 05/01/2011   Past Surgical History  Procedure Laterality Date  . Tubal ligation    . Wound exploration Left 07/26/2014    Procedure: Irrigation, debridement and exploration  of elbow wounds;  Surgeon: Betha Loa, MD;  Location: [facility] OR;  Service: Orthopedics;  Laterality: Left;   Family History  Problem Relation Age of Onset  . Coronary artery disease Mother   . Lung cancer Mother   . Heart attack Father 62   Social History  Substance Use Topics  . Smoking status: Current Every Day Smoker -- 1.00 packs/day for 12 years    Types: Cigarettes  . Smokeless tobacco: Never Used     Comment: She is currently trying to quit.  . Alcohol Use: No     OB History    Gravida Para Term Preterm AB TAB SAB Ectopic Multiple Living   2 2             Review of Systems  Constitutional: Positive for fever.  Gastrointestinal: Positive for abdominal pain, constipation and abdominal distention.  All other systems reviewed and are negative.     Allergies  Penicillins; Codeine; and Tramadol  Home Medications   Prior to Admission medications   Medication Sig Start Date End Date Taking? Authorizing Provider  acetaminophen (TYLENOL) 325 MG tablet Take 650 mg by mouth every 6 (six) hours as needed for mild pain.    Historical Provider, MD  insulin aspart (NOVOLOG) 100 UNIT/ML injection Inject 2-4 Units into the skin 3 (three) times daily before meals. Use sliding scale with meals 3 times a  day: If 150-250, inject 2 units If 250-350, inject 4 units 05/03/11   Standley Brooking, MD  insulin glargine (LANTUS) 100 UNIT/ML injection Inject 40 Units into the skin at bedtime.    Historical Provider, MD  Pseudoeph-CPM-DM-APAP (TYLENOL COLD PO) Take 2 capsules by mouth daily as needed (cold / congestion).    Historical Provider, MD   BP 147/106 mmHg  Pulse 103  Resp 18  Ht 5' (1.524 m)  Wt 105 lb (47.628 kg)  BMI 20.51 kg/m2  SpO2 99%  LMP 12/16/2015  Vital signs normal except tachycardia  Physical Exam  Constitutional: She is oriented to person, place, and time. She appears well-developed and well-nourished.  Non-toxic appearance. She does not appear ill. No distress.   HENT:  Head: Normocephalic and atraumatic.  Right Ear: External ear normal.  Left Ear: External ear normal.  Nose: Nose normal. No mucosal edema or rhinorrhea.  Mouth/Throat: Oropharynx is clear and moist and mucous membranes are normal. No dental abscesses or uvula swelling.  Eyes: Conjunctivae and EOM are normal. Pupils are equal, round, and reactive to light.  Neck: Normal range of motion and full passive range of motion without pain. Neck supple.  Cardiovascular: Normal rate, regular rhythm and normal heart sounds.  Exam reveals no gallop and no friction rub.   No murmur heard. Pulmonary/Chest: Effort normal and breath sounds normal. No respiratory distress. She has no wheezes. She has no rhonchi. She has no rales. She exhibits no tenderness and no crepitus.  Abdominal: Soft. Normal appearance and bowel sounds are normal. She exhibits distension. There is no tenderness. There is no rebound and no guarding.  Abdominal distention and feels firm.   Musculoskeletal: Normal range of motion. She exhibits no edema or tenderness.  Moves all extremities well.   Neurological: She is alert and oriented to person, place, and time. She has normal strength. No cranial nerve deficit.  Skin: Skin is warm, dry and intact. No rash noted. No erythema. No pallor.  Psychiatric: She has a normal mood and affect. Her speech is normal and behavior is normal. Her mood appears not anxious.  Nursing note and vitals reviewed.   ED Course  Procedures (including critical care time)  Medications  sodium chloride 0.9 % bolus 1,000 mL (1,000 mLs Intravenous New Bag/Given 01/05/16 0416)  polyethylene glycol-electrolytes (NuLYTELY/GoLYTELY) solution 4,000 mL (4,000 mLs Oral Given 01/05/16 0405)    DIAGNOSTIC STUDIES: Oxygen Saturation is 99% on RA, nl by my interpretation.    COORDINATION OF CARE: 1:08 AM we discussed her x-ray results. Discussed treatment plan with pt at bedside which includes labs, UA, abdominal  CT scan and pt agreed to plan.   After reviewing patient's CT scan patient was ordered a bottle of GoLYTELY.  4:30 AM nurse's note patient left out the back door and left the half bottle of GoLYTELY in her room. She did not get her IV fluids for her hyperglycemia or dehydration.   Labs Review Results for orders placed or performed during the hospital encounter of 01/05/16  Urinalysis, Routine w reflex microscopic  Result Value Ref Range   Color, Urine AMBER (A) YELLOW   APPearance HAZY (A) CLEAR   Specific Gravity, Urine 1.010 1.005 - 1.030   pH 6.0 5.0 - 8.0   Glucose, UA >1000 (A) NEGATIVE mg/dL   Hgb urine dipstick TRACE (A) NEGATIVE   Bilirubin Urine MODERATE (A) NEGATIVE   Ketones, ur NEGATIVE NEGATIVE mg/dL   Protein, ur NEGATIVE NEGATIVE mg/dL  Nitrite NEGATIVE NEGATIVE   Leukocytes, UA NEGATIVE NEGATIVE  Comprehensive metabolic panel  Result Value Ref Range   Sodium 135 135 - 145 mmol/L   Potassium 3.9 3.5 - 5.1 mmol/L   Chloride 99 (L) 101 - 111 mmol/L   CO2 29 22 - 32 mmol/L   Glucose, Bld 315 (H) 65 - 99 mg/dL   BUN 11 6 - 20 mg/dL   Creatinine, Ser 9.60 0.44 - 1.00 mg/dL   Calcium 9.3 8.9 - 45.4 mg/dL   Total Protein 8.3 (H) 6.5 - 8.1 g/dL   Albumin 4.2 3.5 - 5.0 g/dL   AST 16 15 - 41 U/L   ALT 19 14 - 54 U/L   Alkaline Phosphatase 84 38 - 126 U/L   Total Bilirubin 0.4 0.3 - 1.2 mg/dL   GFR calc non Af Amer >60 >60 mL/min   GFR calc Af Amer >60 >60 mL/min   Anion gap 7 5 - 15  CBC with Differential  Result Value Ref Range   WBC 12.1 (H) 4.0 - 10.5 K/uL   RBC 5.05 3.87 - 5.11 MIL/uL   Hemoglobin 14.3 12.0 - 15.0 g/dL   HCT 09.8 11.9 - 14.7 %   MCV 83.8 78.0 - 100.0 fL   MCH 28.3 26.0 - 34.0 pg   MCHC 33.8 30.0 - 36.0 g/dL   RDW 82.9 (H) 56.2 - 13.0 %   Platelets 233 150 - 400 K/uL   Neutrophils Relative % 58 %   Neutro Abs 7.0 1.7 - 7.7 K/uL   Lymphocytes Relative 34 %   Lymphs Abs 4.1 (H) 0.7 - 4.0 K/uL   Monocytes Relative 8 %   Monocytes  Absolute 1.0 0.1 - 1.0 K/uL   Eosinophils Relative 0 %   Eosinophils Absolute 0.1 0.0 - 0.7 K/uL   Basophils Relative 0 %   Basophils Absolute 0.0 0.0 - 0.1 K/uL  Urine microscopic-add on  Result Value Ref Range   Squamous Epithelial / LPF 0-5 (A) NONE SEEN   WBC, UA 6-30 0 - 5 WBC/hpf   RBC / HPF 0-5 0 - 5 RBC/hpf   Bacteria, UA MANY (A) NONE SEEN  CBG monitoring, ED  Result Value Ref Range   Glucose-Capillary 258 (H) 65 - 99 mg/dL   Comment 1 Notify RN    Comment 2 Document in Chart   POC urine preg, ED (not at [facility])  Result Value Ref Range   Preg Test, Ur NEGATIVE NEGATIVE   Laboratory interpretation all normal except hyperglycemia without acidosis, leukocytosis, possible UTI      Ct Abdomen Pelvis Wo Contrast  01/05/2016  CLINICAL DATA:  Abdominal pain and distention. No bowel movement for 1 month. EXAM: CT ABDOMEN AND PELVIS WITHOUT CONTRAST TECHNIQUE: Multidetector CT imaging of the abdomen and pelvis was performed following the standard protocol without IV contrast. COMPARISON:  Radiographs 01/05/2016, CT 12/16/2014. FINDINGS: The stomach is distended with ingested material. No gastric mass is evident. The small bowel is also moderately distended without evidence of obstruction. Very large colonic stool volume without evidence of mechanical obstruction. No extraluminal air. No pneumatosis. No focal bowel abnormality. No acute inflammatory changes are evident in the abdomen or pelvis. There is no ascites. There are unremarkable unenhanced appearances of the liver, biliary system, spleen, pancreas, adrenals and kidneys. Aorta is normal in caliber without atherosclerotic calcification. No significant abnormality in the lung bases. No significant skeletal lesion. IMPRESSION: Large volume of luminal content throughout the bowel without evidence of mechanical  obstruction, focal inflammatory change, or mass. Electronically Signed   By: Ellery Plunk M.D.   On: 01/05/2016 01:57   Dg  Abd 2 Views  01/05/2016  CLINICAL DATA:  Generalized abdominal pain, abdominal distention, and poor bowel movements for a month. EXAM: ABDOMEN - 2 VIEW COMPARISON:  CT 12/16/2014 FINDINGS: Stomach is diffusely distended with ingested material. Can't exclude a gastric bezoar. Scattered gas and stool throughout the colon. No small or large bowel distention. No free intra-abdominal air. No abnormal air-fluid levels. No radiopaque stones. Visualized bones appear intact. IMPRESSION: Diffusely distended stomach with ingested material. Can't exclude gastric bezoar. No evidence of small or large bowel obstruction. Electronically Signed   By: Burman Nieves M.D.   On: 01/05/2016 00:20     I have personally reviewed and evaluated these images and lab results as part of my medical decision-making.    MDM   Final diagnoses:  Constipation, unspecified constipation type   Pt left AMA  Devoria Albe, MD, FACEP   I personally performed the services described in this documentation, which was scribed in my presence. The recorded information has been reviewed and considered.  Devoria Albe, MD, Concha Pyo, MD 01/05/16 [phone]

## 2016-01-18 ENCOUNTER — Emergency Department (HOSPITAL_COMMUNITY)
Admission: EM | Admit: 2016-01-18 | Discharge: 2016-01-19 | Disposition: A | Discharge status: Other Acute Inpatient Hospital | Payer: Medicaid Other | Attending: Emergency Medicine | Admitting: Emergency Medicine

## 2016-01-18 ENCOUNTER — Encounter (HOSPITAL_COMMUNITY): Payer: Self-pay | Admitting: Emergency Medicine

## 2016-01-18 DIAGNOSIS — R45851 Suicidal ideations: Secondary | ICD-10-CM | POA: Diagnosis not present

## 2016-01-18 DIAGNOSIS — F17210 Nicotine dependence, cigarettes, uncomplicated: Secondary | ICD-10-CM | POA: Insufficient documentation

## 2016-01-18 DIAGNOSIS — Z0000 Encounter for general adult medical examination without abnormal findings: Secondary | ICD-10-CM | POA: Diagnosis present

## 2016-01-18 DIAGNOSIS — Z79899 Other long term (current) drug therapy: Secondary | ICD-10-CM | POA: Insufficient documentation

## 2016-01-18 DIAGNOSIS — E1165 Type 2 diabetes mellitus with hyperglycemia: Secondary | ICD-10-CM | POA: Diagnosis not present

## 2016-01-18 DIAGNOSIS — R739 Hyperglycemia, unspecified: Secondary | ICD-10-CM

## 2016-01-18 DIAGNOSIS — Z794 Long term (current) use of insulin: Secondary | ICD-10-CM | POA: Insufficient documentation

## 2016-01-18 DIAGNOSIS — R0789 Other chest pain: Secondary | ICD-10-CM | POA: Insufficient documentation

## 2016-01-18 LAB — COMPREHENSIVE METABOLIC PANEL
ALBUMIN: 3.8 g/dL (ref 3.5–5.0)
ALT: 15 U/L (ref 14–54)
AST: 19 U/L (ref 15–41)
Alkaline Phosphatase: 69 U/L (ref 38–126)
Anion gap: 9 (ref 5–15)
BUN: 12 mg/dL (ref 6–20)
CHLORIDE: 95 mmol/L — AB (ref 101–111)
CO2: 26 mmol/L (ref 22–32)
Calcium: 8.8 mg/dL — ABNORMAL LOW (ref 8.9–10.3)
Creatinine, Ser: 0.51 mg/dL (ref 0.44–1.00)
GFR calc Af Amer: >60 mL/min (ref >60)
GFR calc non Af Amer: >60 mL/min (ref >60)
GLUCOSE: 699 mg/dL — AB (ref 65–99)
POTASSIUM: 4 mmol/L (ref 3.5–5.1)
SODIUM: 130 mmol/L — AB (ref 135–145)
Total Bilirubin: 0.3 mg/dL (ref 0.3–1.2)
Total Protein: 7.6 g/dL (ref 6.5–8.1)

## 2016-01-18 LAB — CBC
HEMATOCRIT: 41.7 % (ref 36.0–46.0)
Hemoglobin: 13.6 g/dL (ref 12.0–15.0)
MCH: 28.2 pg (ref 26.0–34.0)
MCHC: 32.6 g/dL (ref 30.0–36.0)
MCV: 86.3 fL (ref 78.0–100.0)
Platelets: 239 10*3/uL (ref 150–400)
RBC: 4.83 MIL/uL (ref 3.87–5.11)
RDW: 16.6 % — AB (ref 11.5–15.5)
WBC: 7.8 10*3/uL (ref 4.0–10.5)

## 2016-01-18 LAB — RAPID URINE DRUG SCREEN, HOSP PERFORMED
Amphetamines: NOT DETECTED
BARBITURATES: NOT DETECTED
Benzodiazepines: POSITIVE — AB
Cocaine: NOT DETECTED
Opiates: NOT DETECTED
TETRAHYDROCANNABINOL: POSITIVE — AB

## 2016-01-18 LAB — CBG MONITORING, ED: GLUCOSE-CAPILLARY: 465 mg/dL — AB (ref 65–99)

## 2016-01-18 LAB — ETHANOL: Alcohol, Ethyl (B): <5 mg/dL (ref <5)

## 2016-01-18 MED ORDER — INSULIN ASPART 100 UNIT/ML ~~LOC~~ SOLN
5.0000 [IU] | Freq: Once | SUBCUTANEOUS | Status: AC
  Administered 2016-01-18: 5 [IU] via INTRAVENOUS
  Filled 2016-01-18: qty 1

## 2016-01-18 MED ORDER — IBUPROFEN 400 MG PO TABS: 600.0000 mg | ORAL_TABLET | Freq: Three times a day (TID) | ORAL | Status: DC | PRN

## 2016-01-18 MED ORDER — ALUM & MAG HYDROXIDE-SIMETH 200-200-20 MG/5ML PO SUSP: 30.0000 mL | ORAL | Status: DC | PRN

## 2016-01-18 MED ORDER — SODIUM CHLORIDE 0.9 % IV BOLUS (SEPSIS)
1000.0000 mL | Freq: Once | INTRAVENOUS | Status: AC
  Administered 2016-01-18: 1000 mL via INTRAVENOUS

## 2016-01-18 MED ORDER — INSULIN ASPART 100 UNIT/ML ~~LOC~~ SOLN
2.0000 [IU] | Freq: Three times a day (TID) | SUBCUTANEOUS | Status: DC
  Administered 2016-01-19: 4 [IU] via SUBCUTANEOUS
  Filled 2016-01-18: qty 1

## 2016-01-18 MED ORDER — ACETAMINOPHEN 325 MG PO TABS: 650.0000 mg | ORAL_TABLET | ORAL | Status: DC | PRN

## 2016-01-18 MED ORDER — INSULIN GLARGINE 100 UNIT/ML ~~LOC~~ SOLN
40.0000 [IU] | Freq: Every day | SUBCUTANEOUS | Status: DC
  Administered 2016-01-18: 40 [IU] via SUBCUTANEOUS
  Filled 2016-01-18 (×2): qty 0.4

## 2016-01-18 MED ORDER — ONDANSETRON HCL 4 MG PO TABS: 4.0000 mg | ORAL_TABLET | Freq: Three times a day (TID) | ORAL | Status: DC | PRN

## 2016-01-18 NOTE — ED Notes (Signed)
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 Pt reports she takes 10mg -50mg  po xanax daily. Last took 30mg  xanax yesterday. Pt reports last use of crack cocaine on Friday.

## 2016-01-18 NOTE — ED Notes (Signed)
CRITICAL VALUE ALERT  Critical value received:  Glucose  Date of notification:  01/18/2016  Time of notification:  1947  Critical value read back:Yes.    Nurse who received alert:  Neldon Mc RN  MD notified (1st page):  Dr. Clayborne Dana  Time of first page:  1952

## 2016-01-18 NOTE — ED Notes (Signed)
IV fluids not running. Pt c/o pain at IV site.
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
## 2016-01-18 NOTE — ED Notes (Signed)
Pt states that she "migth not have been that truthful with doctor" and that she does feel like killing self and that she would do it by "taking a bunch of pills".

## 2016-01-18 NOTE — ED Notes (Signed)
Pt up and ambulatory to bathroom with sitter.

## 2016-01-18 NOTE — ED Notes (Signed)
Pt wanting to leave because she is upset with our policies for Psych Hold pt's. Dr. Clayborne Dana notified. Stated he would fill out IVC paperwork.

## 2016-01-18 NOTE — ED Notes (Signed)
AC called for Lantus dose.

## 2016-01-18 NOTE — ED Notes (Signed)
Sitter at bedside, pt placed in scrubs and belongings locked in lockers.

## 2016-01-18 NOTE — ED Notes (Signed)
Notified Dr. Clayborne Dana that pt wanted to commit suicide. Pt now placed on suicide precautions.

## 2016-01-18 NOTE — ED Notes (Signed)
Pt's IV site changed. Fluids infusing at bolus rate.

## 2016-01-19 ENCOUNTER — Encounter (HOSPITAL_COMMUNITY): Payer: Self-pay

## 2016-01-19 ENCOUNTER — Inpatient Hospital Stay (HOSPITAL_COMMUNITY)
Admission: AD | Admit: 2016-01-19 | Discharge: 2016-01-20 | DRG: 880 | Disposition: A | Discharge status: Home / Self Care | Payer: Medicaid Other | Source: Intra-hospital | Attending: Psychiatry | Admitting: Psychiatry

## 2016-01-19 ENCOUNTER — Encounter (HOSPITAL_COMMUNITY): Payer: Self-pay | Admitting: *Deleted

## 2016-01-19 DIAGNOSIS — F1310 Sedative, hypnotic or anxiolytic abuse, uncomplicated: Secondary | ICD-10-CM | POA: Diagnosis not present

## 2016-01-19 DIAGNOSIS — F329 Major depressive disorder, single episode, unspecified: Secondary | ICD-10-CM | POA: Diagnosis present

## 2016-01-19 DIAGNOSIS — F17210 Nicotine dependence, cigarettes, uncomplicated: Secondary | ICD-10-CM | POA: Diagnosis present

## 2016-01-19 DIAGNOSIS — G4700 Insomnia, unspecified: Secondary | ICD-10-CM | POA: Diagnosis present

## 2016-01-19 DIAGNOSIS — F410 Panic disorder [episodic paroxysmal anxiety] without agoraphobia: Principal | ICD-10-CM

## 2016-01-19 DIAGNOSIS — F411 Generalized anxiety disorder: Secondary | ICD-10-CM | POA: Diagnosis present

## 2016-01-19 DIAGNOSIS — R45851 Suicidal ideations: Secondary | ICD-10-CM | POA: Diagnosis not present

## 2016-01-19 DIAGNOSIS — Z8249 Family history of ischemic heart disease and other diseases of the circulatory system: Secondary | ICD-10-CM

## 2016-01-19 DIAGNOSIS — Z801 Family history of malignant neoplasm of trachea, bronchus and lung: Secondary | ICD-10-CM | POA: Diagnosis not present

## 2016-01-19 DIAGNOSIS — Z794 Long term (current) use of insulin: Secondary | ICD-10-CM

## 2016-01-19 DIAGNOSIS — E109 Type 1 diabetes mellitus without complications: Secondary | ICD-10-CM | POA: Diagnosis present

## 2016-01-19 DIAGNOSIS — F1994 Other psychoactive substance use, unspecified with psychoactive substance-induced mood disorder: Secondary | ICD-10-CM | POA: Diagnosis present

## 2016-01-19 LAB — CBG MONITORING, ED
GLUCOSE-CAPILLARY: 297 mg/dL — AB (ref 65–99)
GLUCOSE-CAPILLARY: 160 mg/dL — AB (ref 65–99)
Glucose-Capillary: 264 mg/dL — ABNORMAL HIGH (ref 65–99)
Glucose-Capillary: 78 mg/dL (ref 65–99)

## 2016-01-19 LAB — GLUCOSE, CAPILLARY: Glucose-Capillary: 209 mg/dL — ABNORMAL HIGH (ref 65–99)

## 2016-01-19 MED ORDER — ALUM & MAG HYDROXIDE-SIMETH 200-200-20 MG/5ML PO SUSP: 30.0000 mL | ORAL | Status: DC | PRN

## 2016-01-19 MED ORDER — ADULT MULTIVITAMIN W/MINERALS CH
1.0000 | ORAL_TABLET | Freq: Every day | ORAL | Status: DC
  Administered 2016-01-20: 1 via ORAL
  Filled 2016-01-19 (×4): qty 1

## 2016-01-19 MED ORDER — CHLORDIAZEPOXIDE HCL 25 MG PO CAPS
25.0000 mg | ORAL_CAPSULE | Freq: Four times a day (QID) | ORAL | Status: DC
  Administered 2016-01-19 – 2016-01-20 (×3): 25 mg via ORAL
  Filled 2016-01-19 (×3): qty 1

## 2016-01-19 MED ORDER — CHLORDIAZEPOXIDE HCL 25 MG PO CAPS
25.0000 mg | ORAL_CAPSULE | Freq: Every day | ORAL | Status: DC
Start: 2016-01-24 — End: 2016-01-20

## 2016-01-19 MED ORDER — ACETAMINOPHEN 325 MG PO TABS
650.0000 mg | ORAL_TABLET | Freq: Four times a day (QID) | ORAL | Status: DC | PRN
  Administered 2016-01-19: 650 mg via ORAL
  Filled 2016-01-19: qty 2

## 2016-01-19 MED ORDER — VITAMIN B-1 100 MG PO TABS
100.0000 mg | ORAL_TABLET | Freq: Every day | ORAL | Status: DC
  Administered 2016-01-20: 100 mg via ORAL
  Filled 2016-01-19 (×4): qty 1

## 2016-01-19 MED ORDER — CHLORDIAZEPOXIDE HCL 25 MG PO CAPS: 25.0000 mg | ORAL_CAPSULE | Freq: Three times a day (TID) | ORAL | Status: DC

## 2016-01-19 MED ORDER — CHLORDIAZEPOXIDE HCL 25 MG PO CAPS: 25.0000 mg | ORAL_CAPSULE | Freq: Four times a day (QID) | ORAL | Status: DC | PRN

## 2016-01-19 MED ORDER — ONDANSETRON 4 MG PO TBDP
4.0000 mg | ORAL_TABLET | Freq: Four times a day (QID) | ORAL | Status: DC | PRN
Start: 2016-01-19 — End: 2016-01-20

## 2016-01-19 MED ORDER — INSULIN ASPART 100 UNIT/ML ~~LOC~~ SOLN
0.0000 [IU] | Freq: Three times a day (TID) | SUBCUTANEOUS | Status: DC
  Administered 2016-01-20: 1 [IU] via SUBCUTANEOUS
  Administered 2016-01-20: 5 [IU] via SUBCUTANEOUS

## 2016-01-19 MED ORDER — CHLORDIAZEPOXIDE HCL 25 MG PO CAPS: 25.0000 mg | ORAL_CAPSULE | ORAL | Status: DC

## 2016-01-19 MED ORDER — INSULIN GLARGINE 100 UNIT/ML ~~LOC~~ SOLN
40.0000 [IU] | Freq: Every day | SUBCUTANEOUS | Status: DC
  Administered 2016-01-19: 40 [IU] via SUBCUTANEOUS

## 2016-01-19 MED ORDER — LOPERAMIDE HCL 2 MG PO CAPS: 2.0000 mg | ORAL_CAPSULE | ORAL | Status: DC | PRN

## 2016-01-19 MED ORDER — MAGNESIUM HYDROXIDE 400 MG/5ML PO SUSP: 30.0000 mL | Freq: Every day | ORAL | Status: DC | PRN

## 2016-01-19 MED ORDER — TRAZODONE HCL 50 MG PO TABS
50.0000 mg | ORAL_TABLET | Freq: Every evening | ORAL | Status: DC | PRN
  Administered 2016-01-19: 50 mg via ORAL
  Filled 2016-01-19: qty 7
  Filled 2016-01-19: qty 1

## 2016-01-19 MED ORDER — HYDROXYZINE HCL 25 MG PO TABS
25.0000 mg | ORAL_TABLET | Freq: Four times a day (QID) | ORAL | Status: DC | PRN
  Filled 2016-01-19: qty 10

## 2016-01-19 NOTE — ED Notes (Signed)
Patient given meal tray.

## 2016-01-19 NOTE — Tx Team (Signed)
Initial Interdisciplinary Treatment Plan   PATIENT STRESSORS: Loss of father Marital or family conflict Substance abuse   PATIENT STRENGTHS: Ability for insight Communication skills Motivation for treatment/growth Physical Health   PROBLEM LIST: Problem List/Patient Goals Date to be addressed Date deferred Reason deferred Estimated date of resolution  Substance Abuse Jan 22, 2016\ 22-Jan-2016   D/C  Anxiety 2016/01/22  01/22/2016   D/C  Depression 2016/01/22  01-22-16   D/C  "Tired of abusing Xanax" 01-22-2016  01-22-2016   D/C  "Stress from marriage" 2016/01/22  22-Jan-2016   D/C  "coping with my father's death" 01-22-16  2016/01/22   D/C                     DISCHARGE CRITERIA:  Ability to meet basic life and health needs Improved stabilization in mood, thinking, and/or behavior Motivation to continue treatment in a less acute level of care Withdrawal symptoms are absent or subacute and managed without 24-hour nursing intervention  PRELIMINARY DISCHARGE PLAN: Attend 12-step recovery group Outpatient therapy Participate in family therapy Return to previous living arrangement  PATIENT/FAMIILY INVOLVEMENT: This treatment plan has been presented to and reviewed with the patient, [name].  The patient and family have been given the opportunity to ask questions and make suggestions.  Larry Sierras P Jan 22, 2016, 11:19 PM

## 2016-01-19 NOTE — ED Notes (Signed)
Patient offered shower. Patient refused shower at this time.
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
## 2016-01-19 NOTE — ED Notes (Signed)
Tele psych being done at this time. 
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
## 2016-01-19 NOTE — ED Notes (Signed)
Samantha at [facility] called to inform us that they are seeking In-patient placement for Pt.  Nurse informed.

## 2016-01-19 NOTE — Progress Notes (Addendum)
Inpatient Diabetes Program Recommendations  AACE/ADA: New Consensus Statement on Inpatient Glycemic Control (2015)  Target Ranges:  Prepandial:   less than 140 mg/dL      Peak postprandial:   less than 180 mg/dL (1-2 hours)      Critically ill patients:  140 - 180 mg/dL   Review of Glycemic Control Results for [name], [name] (MRN [id number]) as of 01/19/2016 10:06  Ref. Range 01/05/2016 02:56 01/18/2016 22:24 01/19/2016 01:05 01/19/2016 07:19  Glucose-Capillary Latest Ref Range: 65-99 mg/dL 503 (H) 888 (H) 280 (H) 78   Diabetes history: DM Type 1 Outpatient Diabetes medications: Lantus insulin 40 units q hs + Novolog 2-4 tid with meals Current orders for Inpatient glycemic control: Lantus insulin 40 units q hs + Novolog 2-4 tid with meals   Inpatient Diabetes Program Recommendations:  Noted patient has restarted on home insulin orders. Please consider decrease in Lantus insulin to 25 units while in the hospital.  Thank you, Billy Fischer. Florenda Watt, RN, MSN, CDE Inpatient Glycemic Control Team Team Pager [phone] (8am-5pm) 01/19/2016 10:12 AM

## 2016-01-19 NOTE — ED Notes (Signed)
Called [facility] for transport to [facility]. 

## 2016-01-19 NOTE — BHH Counselor (Signed)
Continuing to monitor pt's blood sugar level.  It appears nothing has changed.  Per AC, pt is not medically cleared until blood sugar goes below and stays below 250.  Beryle Flock, MS, CRC, [facility] Triage Specialist [facility]

## 2016-01-19 NOTE — BH Assessment (Addendum)
Tele Assessment Note   [name] is an 30 y.o. 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 Pt disclosed to RN that she was suicidal with a plan to OD on pills. She then denied that she was suicidal and asked to leave. She was subsequently IVC'd. Pt was calm and cooperative during assessment. She denied SI, indicating "that was yesterday". Pt also denied HI, AVH or hx of any. Pt indicated that "I just have a drug problem" and expressed a desire to obtain detox/rehab. Pt reported never being diagnosed with a mental health disorder and never being prescribed any mental health medications. Pt did endorse depressive symptoms including tearfulness, anhedonia, and feelings of worthlessness.  Diagnosis: Cocaine use disorder, Severe; Unspecified other (or unknown) substance-related disorder  Past Medical History:  Past Medical History  Diagnosis Date  . Diabetes mellitus   . DKA 05/01/2011    Past Surgical History  Procedure Laterality Date  . Tubal ligation    . Wound exploration Left 07/26/2014    Procedure: Irrigation, debridement and exploration of elbow wounds;  Surgeon: Betha Loa, MD;  Location: [facility] OR;  Service: Orthopedics;  Laterality: Left;    Family History:  Family History  Problem Relation Age of Onset  . Coronary artery disease Mother   . Lung cancer Mother   . Heart attack Father 63    Social History:  reports that she has been smoking Cigarettes.  She has a 12 pack-year smoking history. She has never used smokeless tobacco. She reports that she uses illicit drugs (Cocaine). She reports that she does not drink alcohol.  Additional Social History:  Alcohol / Drug Use Pain Medications: see PTA meds Prescriptions: see PTA meds Over the Counter: see PTA meds History of alcohol / drug use?: Yes Longest period of sobriety (when/how long): 2 years (9 months ago) Negative Consequences of Use: Personal  relationships Substance #1 Name of Substance 1: Xanax 1 - Age of First Use: unknown 1 - Amount (size/oz): "depends" 1 - Frequency: "depends" 1 - Duration: ongoing 1 - Last Use / Amount: yesterday Substance #2 Name of Substance 2: crack 2 - Age of First Use: unknown 2 - Amount (size/oz): "depends" 2 - Frequency: couple of times/month 2 - Duration: ongoing 2 - Last Use / Amount: @ a week ago  CIWA: CIWA-Ar BP: (!) 90/50 mmHg Pulse Rate: 76 COWS:    PATIENT STRENGTHS: (choose at least two) Average or above average intelligence Capable of independent living Supportive family/friends  Allergies:  Allergies  Allergen Reactions  . Penicillins Anaphylaxis  . Codeine Nausea And Vomiting  . Tramadol Itching    Home Medications:  (Not in a hospital admission)  OB/GYN Status:  Patient's last menstrual period was 01/16/2016.  General Assessment Data [facility] of Assessment: [facility] Assessment: In system Is this a Tele or Face-to-Face Assessment?: Tele Assessment Is this an Initial Assessment or a Re-assessment for this encounter?: Initial Assessment Marital status: Divorced Is patient pregnant?: No Pregnancy Status: No Living Arrangements: Children, Other (Comment) (ex-husband) Can pt return to current living arrangement?: Yes Admission Status: Involuntary Is patient capable of signing voluntary admission?: Yes Referral Source: Self/Family/Friend Insurance type: Medicaid  Medical Screening Exam [facility] Walk-in ONLY) Medical Exam completed: Yes  Crisis Care Plan Living Arrangements: Children, Other (Comment) (ex-husband) Name of Psychiatrist: none Name of Therapist: none  Education Status Is patient currently in school?: No  Risk to self with the past  6 months Suicidal Ideation: No-Not Currently/Within Last 6 Months Has patient been a risk to self within the past 6 months prior to admission? : No Suicidal Intent: No-Not Currently/Within Last 6 Months Has patient  had any suicidal intent within the past 6 months prior to admission? : No Is patient at risk for suicide?: No Suicidal Plan?: No-Not Currently/Within Last 6 Months Has patient had any suicidal plan within the past 6 months prior to admission? : Yes Access to Means: Yes Specify Access to Suicidal Means: pt has access to pills What has been your use of drugs/alcohol within the last 12 months?: see above Previous Attempts/Gestures: No How many times?: 0 Other Self Harm Risks: 0 Triggers for Past Attempts: Other (Comment) (no past attempts) Intentional Self Injurious Behavior: None Family Suicide History: No Recent stressful life event(s): Other (Comment) (continued drug use) Persecutory voices/beliefs?: No Depression: Yes Depression Symptoms: Tearfulness, Loss of interest in usual pleasures, Feeling worthless/self pity, Feeling angry/irritable Substance abuse history and/or treatment for substance abuse?: No Suicide prevention information given to non-admitted patients: Not applicable  Risk to Others within the past 6 months Homicidal Ideation: No Does patient have any lifetime risk of violence toward others beyond the six months prior to admission? : No Thoughts of Harm to Others: No Current Homicidal Intent: No Current Homicidal Plan: No Access to Homicidal Means: No History of harm to others?: No Assessment of Violence: None Noted Violent Behavior Description: none noted Does patient have access to weapons?: No Criminal Charges Pending?: No Does patient have a court date: No Is patient on probation?: No  Psychosis Hallucinations: None noted Delusions: None noted  Mental Status Report Appearance/Hygiene: Unremarkable Eye Contact: Good Motor Activity: Unremarkable Speech: Logical/coherent Level of Consciousness: Quiet/awake, Alert Mood: Pleasant Affect: Appropriate to circumstance Anxiety Level: Minimal Thought Processes: Coherent, Relevant Judgement:  Unimpaired Orientation: Person, Place, Time, Situation, Appropriate for developmental age Obsessive Compulsive Thoughts/Behaviors: None  Cognitive Functioning Concentration: Normal Memory: Recent Intact, Remote Intact IQ: Average Insight: Good Impulse Control: Good Appetite: Good Sleep: No Change Vegetative Symptoms: None  ADLScreening [facility] Assessment Services) Patient's cognitive ability adequate to safely complete daily activities?: Yes Patient able to express need for assistance with ADLs?: Yes Independently performs ADLs?: Yes (appropriate for developmental age)  Prior Inpatient Therapy Prior Inpatient Therapy: No  Prior Outpatient Therapy Prior Outpatient Therapy: No Does patient have an ACCT team?: No Does patient have Intensive In-House Services?  : No Does patient have [facility] services? : No Does patient have P4CC services?: No  ADL Screening (condition at time of admission) Patient's cognitive ability adequate to safely complete daily activities?: Yes Is the patient deaf or have difficulty hearing?: No Does the patient have difficulty seeing, even when wearing glasses/contacts?: No Does the patient have difficulty concentrating, remembering, or making decisions?: No Patient able to express need for assistance with ADLs?: Yes Does the patient have difficulty dressing or bathing?: No Independently performs ADLs?: Yes (appropriate for developmental age) Does the patient have difficulty walking or climbing stairs?: No Weakness of Legs: None Weakness of Arms/Hands: None  Home Assistive Devices/Equipment Home Assistive Devices/Equipment: None  Therapy Consults (therapy consults require a physician order) PT Evaluation Needed: No OT Evalulation Needed: No SLP Evaluation Needed: No Abuse/Neglect Assessment (Assessment to be complete while patient is alone) Physical Abuse: Denies Verbal Abuse: Denies Sexual Abuse: Denies Exploitation of patient/patient's resources:  Denies Self-Neglect: Denies Values / Beliefs Cultural Requests During Hospitalization: None Spiritual Requests During Hospitalization: None Consults Spiritual Care Consult Needed:  No Social Work Consult Needed: No Merchant navy officer (For Healthcare) Does patient have an advance directive?: No Would patient like information on creating an advanced directive?: No - patient declined information    Additional Information 1:1 In Past 12 Months?: No CIRT Risk: No Elopement Risk: No Does patient have medical clearance?: Yes     Disposition:  Disposition Initial Assessment Completed for this Encounter: Yes Disposition of Patient: Inpatient treatment program (consulted with Fransisca Kaufmann, NP) Type of inpatient treatment program: Adult (TTS to seek placement. )  Laddie Aquas 01/19/2016 10:06 AM

## 2016-01-19 NOTE — Plan of Care (Signed)
Problem: Diagnosis: Increased Risk For Suicide Attempt Goal: STG-Patient Will Comply With Medication Regime Outcome: Progressing Pt has been compliant with medications tonight.       

## 2016-01-19 NOTE — Progress Notes (Signed)
Pt accepted to [facility] bed 306-2, attending is Dr. Dub Mikes. Report can be called at [phone]. Pt under IVC, can arrive anytime.   Ilean Skill, MSW, LCSW Clinical Social Work, Disposition  01/19/2016 [phone]

## 2016-01-19 NOTE — Progress Notes (Signed)
Pt did not attend wrap up group meeting.  

## 2016-01-19 NOTE — ED Provider Notes (Signed)
8:15 AM resting comfortably. Denies complaint Results for orders placed or performed during the hospital encounter of 01/18/16  Comprehensive metabolic panel  Result Value Ref Range   Sodium 130 (L) 135 - 145 mmol/L   Potassium 4.0 3.5 - 5.1 mmol/L   Chloride 95 (L) 101 - 111 mmol/L   CO2 26 22 - 32 mmol/L   Glucose, Bld 699 (HH) 65 - 99 mg/dL   BUN 12 6 - 20 mg/dL   Creatinine, Ser 2.33 0.44 - 1.00 mg/dL   Calcium 8.8 (L) 8.9 - 10.3 mg/dL   Total Protein 7.6 6.5 - 8.1 g/dL   Albumin 3.8 3.5 - 5.0 g/dL   AST 19 15 - 41 U/L   ALT 15 14 - 54 U/L   Alkaline Phosphatase 69 38 - 126 U/L   Total Bilirubin 0.3 0.3 - 1.2 mg/dL   GFR calc non Af Amer >60 >60 mL/min   GFR calc Af Amer >60 >60 mL/min   Anion gap 9 5 - 15  Ethanol (ETOH)  Result Value Ref Range   Alcohol, Ethyl (B) <5 <5 mg/dL  CBC  Result Value Ref Range   WBC 7.8 4.0 - 10.5 K/uL   RBC 4.83 3.87 - 5.11 MIL/uL   Hemoglobin 13.6 12.0 - 15.0 g/dL   HCT 61.2 24.4 - 97.5 %   MCV 86.3 78.0 - 100.0 fL   MCH 28.2 26.0 - 34.0 pg   MCHC 32.6 30.0 - 36.0 g/dL   RDW 30.0 (H) 51.1 - 02.1 %   Platelets 239 150 - 400 K/uL  Urine rapid drug screen (hosp performed) (Not at [facility])  Result Value Ref Range   Opiates NONE DETECTED NONE DETECTED   Cocaine NONE DETECTED NONE DETECTED   Benzodiazepines POSITIVE (A) NONE DETECTED   Amphetamines NONE DETECTED NONE DETECTED   Tetrahydrocannabinol POSITIVE (A) NONE DETECTED   Barbiturates NONE DETECTED NONE DETECTED  CBG monitoring, ED  Result Value Ref Range   Glucose-Capillary 465 (H) 65 - 99 mg/dL   Comment 1 Document in Chart   CBG monitoring, ED  Result Value Ref Range   Glucose-Capillary 264 (H) 65 - 99 mg/dL  CBG monitoring, ED  Result Value Ref Range   Glucose-Capillary 78 65 - 99 mg/dL   Ct Abdomen Pelvis Wo Contrast  01/05/2016  CLINICAL DATA:  Abdominal pain and distention. No bowel movement for 1 month. EXAM: CT ABDOMEN AND PELVIS WITHOUT CONTRAST TECHNIQUE: Multidetector  CT imaging of the abdomen and pelvis was performed following the standard protocol without IV contrast. COMPARISON:  Radiographs 01/05/2016, CT 12/16/2014. FINDINGS: The stomach is distended with ingested material. No gastric mass is evident. The small bowel is also moderately distended without evidence of obstruction. Very large colonic stool volume without evidence of mechanical obstruction. No extraluminal air. No pneumatosis. No focal bowel abnormality. No acute inflammatory changes are evident in the abdomen or pelvis. There is no ascites. There are unremarkable unenhanced appearances of the liver, biliary system, spleen, pancreas, adrenals and kidneys. Aorta is normal in caliber without atherosclerotic calcification. No significant abnormality in the lung bases. No significant skeletal lesion. IMPRESSION: Large volume of luminal content throughout the bowel without evidence of mechanical obstruction, focal inflammatory change, or mass. Electronically Signed   By: Ellery Plunk M.D.   On: 01/05/2016 01:57   Dg Abd 2 Views  01/05/2016  CLINICAL DATA:  Generalized abdominal pain, abdominal distention, and poor bowel movements for a month. EXAM: ABDOMEN - 2 VIEW COMPARISON:  CT 12/16/2014 FINDINGS: Stomach is diffusely distended with ingested material. Can't exclude a gastric bezoar. Scattered gas and stool throughout the colon. No small or large bowel distention. No free intra-abdominal air. No abnormal air-fluid levels. No radiopaque stones. Visualized bones appear intact. IMPRESSION: Diffusely distended stomach with ingested material. Can't exclude gastric bezoar. No evidence of small or large bowel obstruction. Electronically Signed   By: Burman Nieves M.D.   On: 01/05/2016 00:20   4 PM patient is alert and with poor a systematic Glasgow Coma Score 15 and in no distress. He will be transfer to [facility] Dr.Lugo 's accepting physician  Doug Sou, MD 01/19/16 [phone]

## 2016-01-19 NOTE — ED Notes (Signed)
Called to patient's room, patient states "I want my Lantus, they didn't give me my lantus last night." Explained to patient that her blood sugar was 70 and we would be checking her blood sugar prior to meals throughout the day and she would be given Novolog as needed for increased blood sugar, then she would resume her lantus tonight. Patient standing in room with arms crossed stating "Just give me my belongings, I'm going to leave. You can't keep me here. I only said I wanted to hurt myself because I didn't want to be sent home." Advised patient that she was under IVC paperwork and was not able to leave voluntarily. Advised patient we were still waiting for placement at another facility for her treatment. Security called to bedside.

## 2016-01-20 ENCOUNTER — Encounter (HOSPITAL_COMMUNITY): Payer: Self-pay | Admitting: Psychiatry

## 2016-01-20 DIAGNOSIS — F410 Panic disorder [episodic paroxysmal anxiety] without agoraphobia: Secondary | ICD-10-CM | POA: Diagnosis present

## 2016-01-20 DIAGNOSIS — F1310 Sedative, hypnotic or anxiolytic abuse, uncomplicated: Secondary | ICD-10-CM | POA: Diagnosis present

## 2016-01-20 LAB — GLUCOSE, CAPILLARY
GLUCOSE-CAPILLARY: 140 mg/dL — AB (ref 65–99)
GLUCOSE-CAPILLARY: 270 mg/dL — AB (ref 65–99)

## 2016-01-20 MED ORDER — ESCITALOPRAM OXALATE 10 MG PO TABS
5.0000 mg | ORAL_TABLET | Freq: Every day | ORAL | Status: DC
  Administered 2016-01-20: 5 mg via ORAL
  Filled 2016-01-20 (×3): qty 1

## 2016-01-20 MED ORDER — TRAZODONE HCL 50 MG PO TABS: 50.0000 mg | ORAL_TABLET | Freq: Every evening | ORAL | Status: DC | PRN

## 2016-01-20 MED ORDER — CHLORDIAZEPOXIDE HCL 25 MG PO CAPS: ORAL_CAPSULE | ORAL | Status: DC

## 2016-01-20 MED ORDER — HYDROXYZINE HCL 25 MG PO TABS: 25.0000 mg | ORAL_TABLET | Freq: Four times a day (QID) | ORAL | Status: DC | PRN

## 2016-01-20 MED ORDER — INSULIN GLARGINE 100 UNIT/ML ~~LOC~~ SOLN: 40.0000 [IU] | Freq: Every day | SUBCUTANEOUS | Status: DC

## 2016-01-20 MED ORDER — ESCITALOPRAM OXALATE 10 MG PO TABS
ORAL_TABLET | ORAL | Status: AC
  Filled 2016-01-20: qty 1

## 2016-01-20 MED ORDER — ESCITALOPRAM OXALATE 10 MG PO TABS: ORAL_TABLET | ORAL | Status: DC

## 2016-01-20 NOTE — Progress Notes (Signed)
Recreation Therapy Notes  Date: 04.04.02017 Time: 9:30am Location: [address] Hall Group Room   Group Topic: Stress Management  Goal Area(s) Addresses:  Patient will actively participate in stress management techniques presented during session.   Behavioral Response: Did not attend.   Marykay Lex Tamana Hatfield, LRT/CTRS        Tehila Sokolow L 01/20/2016 2:18 PM

## 2016-01-20 NOTE — BHH Suicide Risk Assessment (Signed)
[facility] INPATIENT:  Family/Significant Other Suicide Prevention Education  Suicide Prevention Education:  Education Completed; Emylee Bolter (pt's husband) [phone] has been identified by the patient as the family member/significant other with whom the patient will be residing, and identified as the person(s) who will aid the patient in the event of a mental health crisis (suicidal ideations/suicide attempt).  With written consent from the patient, the family member/significant other has been provided the following suicide prevention education, prior to the and/or following the discharge of the patient.  The suicide prevention education provided includes the following:  Suicide risk factors  Suicide prevention and interventions  National Suicide Hotline telephone number  [facility] assessment telephone number  [facility] Emergency Assistance 911  [facility] and/or Residential Mobile Crisis Unit telephone number  Request made of family/significant other to:  Remove weapons (e.g., guns, rifles, knives), all items previously/currently identified as safety concern.    Remove drugs/medications (over-the-counter, prescriptions, illicit drugs), all items previously/currently identified as a safety concern.  The family member/significant other verbalizes understanding of the suicide prevention education information provided.  The family member/significant other agrees to remove the items of safety concern listed above.  SPE completed with pt's husband who voiced no concerns regarding pt d/cing today. Aftercare plan reviewed with him and he plans to pick pt up at 2:00PM.  Smart, Kesleigh Morson LCSW 01/20/2016, 11:12 AM

## 2016-01-20 NOTE — Progress Notes (Signed)
D: Patient is focused on discharge today.  She is scheduled to leave at 2:00 p.m.  Reviewed discharge instructions and follow up information with patient and she indicated understanding.  Patient states, "I don't know why I was here anyway.  I knew I was taking too many xanax.  I had a clean drug screen."  Patient denies SI/HI/AVH. A: Continue to monitor medication management and MD orders. Safety checks completed every 15 minutes per protocol.  Offer support and encouragement as needed. R: Patient is receptive to staff; her behavior is appropriate.

## 2016-01-20 NOTE — Discharge Summary (Signed)
Physician Discharge Summary Note  Patient:  [name] is an 30 y.o., female MRN:  [id number] DOB:  [birth year] Patient phone:  [phone] (home)  Patient address:   [address] [place] [address],  Total Time spent with patient: 30 minutes  Date of Admission:  01/19/2016 Date of Discharge:  01/20/2016  Reason for Admission:  Panic/anxiety attack  Principal Problem: Panic disorder without agoraphobia with moderate panic attacks Discharge Diagnoses: Patient Active Problem List   Diagnosis Date Noted  . Panic disorder without agoraphobia with moderate panic attacks [F41.0] 01/20/2016  . Benzodiazepine abuse [F13.10] 01/20/2016  . Substance induced mood disorder (HCC) [F19.94] 01/19/2016  . DKA [E13.10] 05/01/2011  . Diabetes mellitus type 1, uncontrolled (HCC) [E10.65] 05/01/2011  . Leukocytosis [D72.829] 05/01/2011    Past Psychiatric History:  See above noted  Past Medical History:  Past Medical History  Diagnosis Date  . Diabetes mellitus   . DKA 05/01/2011    Past Surgical History  Procedure Laterality Date  . Tubal ligation    . Wound exploration Left 07/26/2014    Procedure: Irrigation, debridement and exploration of elbow wounds;  Surgeon: Betha Loa, MD;  Location: [facility] OR;  Service: Orthopedics;  Laterality: Left;   Family History:  Family History  Problem Relation Age of Onset  . Coronary artery disease Mother   . Lung cancer Mother   . Heart attack Father 71   Family Psychiatric  History:  See above noted Social History:  History  Alcohol Use No     History  Drug Use  . Yes  . Special: Cocaine, Marijuana    Comment: xanax    Social History   Social History  . Marital Status: Legally Separated    Spouse Name: N/A  . Number of Children: N/A  . Years of Education: N/A   Social History Main Topics  . Smoking status: Current Every Day Smoker -- 1.00 packs/day for 12 years    Types: Cigarettes  . Smokeless tobacco: Never Used     Comment:  She is currently trying to quit.  . Alcohol Use: No  . Drug Use: Yes    Special: Cocaine, Marijuana     Comment: xanax  . Sexual Activity: Yes    Birth Control/ Protection: Surgical   Other Topics Concern  . None   Social History Narrative   Currently does not have insurance.    Hospital Course:  Mayan Dolney is a 30 yo who reported that her father passed Apr 2016 and she was just informed that her best friend just died.  She experienced panic attacks and was prescribed 0.5 mg.  She overused and wanted to get help.   [place] was admitted for Panic disorder without agoraphobia with moderate panic attacks and crisis management.  She was treated with a Librium detox protocol and started Lexapro 5 mg for 7 days.  If patient tolerates, maintain 10 mg daily regimen for depression.   Medical problems were identified and treated as needed.  Home medications were restarted as appropriate.  Improvement was monitored by observation and Erma Heritage daily report of symptom reduction.  Emotional and mental status was monitored by daily self inventory reports completed by Erma Heritage and clinical staff.  Patient reported continued improvement, denied any new concerns.  Patient had been compliant on medications and denied side effects.  Support and encouragement was provided.    Patient did well during inpatient stay.  At time of discharge, patient rated both depression and  anxiety levels to be manageable and minimal.  Patient was able to identify the triggers of emotional crises and de-stabilizations.  Patient identified the positive things in life that would help in dealing with feelings of loss, depression and unhealthy or abusive tendencies.         Kieara Kovalsky was evaluated by the treatment team for stability and plans for continued recovery upon discharge.  She was offered further treatment options upon discharge including Residential, Intensive Outpatient and Outpatient  treatment. She will follow up with agency listed below for medication management and counseling.  Encouraged patient to maintain satisfactory support network and home environment.  Advised to adhere to medication compliance and outpatient treatment follow up.      Makaylan Stiltner motivation was an integral factor for scheduling further treatment.  Employment, transportation, bed availability, health status, family support, and any pending legal issues were also considered during her hospital stay.  Upon completion of this admission the patient was both mentally and medically stable for discharge denying suicidal/homicidal ideation, auditory/visual/tactile hallucinations, delusional thoughts and paranoia.      Physical Findings: AIMS: Facial and Oral Movements Muscles of Facial Expression: None, normal Lips and Perioral Area: None, normal Jaw: None, normal Tongue: None, normal,Extremity Movements Upper (arms, wrists, hands, fingers): None, normal Lower (legs, knees, ankles, toes): None, normal, Trunk Movements Neck, shoulders, hips: None, normal, Overall Severity Severity of abnormal movements (highest score from questions above): None, normal Incapacitation due to abnormal movements: None, normal Patient's awareness of abnormal movements (rate only patient's report): No Awareness, Dental Status Current problems with teeth and/or dentures?: Yes Does patient usually wear dentures?: Yes  CIWA:  CIWA-Ar Total: 6 COWS:     Musculoskeletal: Strength & Muscle Tone: within normal limits Gait & Station: normal Patient leans: N/A  Psychiatric Specialty Exam:  SEE MD SRA Review of Systems  All other systems reviewed and are negative.   Blood pressure 112/74, pulse 99, temperature 98.1 F (36.7 C), temperature source Oral, resp. rate 16, height 5' (1.524 m), weight 49.896 kg (110 lb), last menstrual period 01/16/2016.Body mass index is 21.48 kg/(m^2).  Have you used any form of tobacco in the  last 30 days? (Cigarettes, Smokeless Tobacco, Cigars, and/or Pipes): Yes  Has this patient used any form of tobacco in the last 30 days? (Cigarettes, Smokeless Tobacco, Cigars, and/or Pipes) Yes, N/A  Blood Alcohol level:  Lab Results  Component Value Date   [facility] <5 01/18/2016   ETH <11 07/26/2014    Metabolic Disorder Labs:  Lab Results  Component Value Date   HGBA1C 11.3* 10/31/2011   MPG 278* 10/31/2011   MPG 352* 06/11/2011   No results found for: PROLACTIN Lab Results  Component Value Date   CHOL 139 03/29/2011   TRIG 100 03/29/2011   HDL 45 03/29/2011   CHOLHDL 3.1 03/29/2011   VLDL 20 03/29/2011   LDLCALC 74 03/29/2011   LDLCALC  01/01/2008    90        Total Cholesterol/HDL:CHD Risk Coronary Heart Disease Risk Table                     Men   Women  1/2 Average Risk   3.4   3.3    See Psychiatric Specialty Exam and Suicide Risk Assessment completed by Attending Physician prior to discharge.  Discharge destination:  Home  Is patient on multiple antipsychotic therapies at discharge:  No   Has Patient had three or more failed trials of  antipsychotic monotherapy by history:  No  Recommended Plan for Multiple Antipsychotic Therapies: NA     Medication List    STOP taking these medications        insulin aspart 100 UNIT/ML injection  Commonly known as:  novoLOG      TAKE these medications      Indication   chlordiazePOXIDE 25 MG capsule  Commonly known as:  LIBRIUM  Day 1:  take 1 capsule (25 mg) 4 times/day Day 2:  take 1 capsule (25 mg) 3 times/day Day 3:  take 1 capsule (25 mg) 2 times/day Day 4:  take  1 capsule (25 mg) once.  This is you last day on it.   Indication:  Acute Alcohol Withdrawal Syndrome     escitalopram 10 MG tablet  Commonly known as:  LEXAPRO  Take 1/2 tab (5 mg) daily for the first 7 days, then take 10 mg for remainder   Indication:  Major Depressive Disorder     hydrOXYzine 25 MG tablet  Commonly known as:  ATARAX/VISTARIL   Take 1 tablet (25 mg total) by mouth every 6 (six) hours as needed for anxiety.   Indication:  Anxiety Neurosis     insulin glargine 100 UNIT/ML injection  Commonly known as:  LANTUS  Inject 0.4 mLs (40 Units total) into the skin at bedtime.   Indication:  Type 2 Diabetes     traZODone 50 MG tablet  Commonly known as:  DESYREL  Take 1 tablet (50 mg total) by mouth at bedtime as needed for sleep.   Indication:  Trouble Sleeping           Follow-up Information    Follow up with [facility] On 01/25/2016.   Why:  Appt on this date at 2:00PM for assessment (medication management and counseling). Please arrive between 1:30PM-1:45PM to complete new patient paperwork and bring Photo ID and Medicaid Card. Thank you.    Contact information:   [address] [place], [place] [address] Phone: [phone] Fax: [phone]      Follow-up recommendations:  Activity:  as tol Diet:  as tol  Comments:  1.  Take all your medications as prescribed.   2.  Report any adverse side effects to outpatient provider. 3.  Patient instructed to not use alcohol or illegal drugs while on prescription medicines. 4.  In the event of worsening symptoms, instructed patient to call 911, the crisis hotline or go to nearest emergency room for evaluation of symptoms.  Signed: Lindwood Qua, NP [facility] 01/20/2016, 2:24 PM  I personally assessed the patient and formulated the plan Madie Reno A. Dub Mikes, M.D.
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
## 2016-01-20 NOTE — [facility] Suicide Risk Assessment (Signed)
[facility] Discharge Suicide Risk Assessment   Principal Problem: Panic disorder without agoraphobia with moderate panic attacks Discharge Diagnoses:  Patient Active Problem List   Diagnosis Date Noted  . Panic disorder without agoraphobia with moderate panic attacks [F41.0] 01/20/2016  . Benzodiazepine abuse [F13.10] 01/20/2016  . Substance induced mood disorder (HCC) [F19.94] 01/19/2016  . DKA [E13.10] 05/01/2011  . Diabetes mellitus type 1, uncontrolled (HCC) [E10.65] 05/01/2011  . Leukocytosis [D72.829] 05/01/2011    Total Time spent with patient: 45 minutes  Musculoskeletal: Strength & Muscle Tone: within normal limits Gait & Station: normal Patient leans: normal  Psychiatric Specialty Exam: Review of Systems  Constitutional: Negative.   HENT: Negative.   Eyes: Negative.   Respiratory: Negative.   Cardiovascular: Negative.   Gastrointestinal: Negative.   Genitourinary: Negative.   Musculoskeletal: Negative.   Skin: Negative.   Neurological: Negative.   Endo/Heme/Allergies: Negative.   Psychiatric/Behavioral: Positive for substance abuse. The patient is nervous/anxious.     Blood pressure 112/74, pulse 99, temperature 98.1 F (36.7 C), temperature source Oral, resp. rate 16, height 5' (1.524 m), weight 49.896 kg (110 lb), last menstrual period 01/16/2016.Body mass index is 21.48 kg/(m^2).  General Appearance: Fairly Groomed  Patent attorney::  Fair  Speech:  Clear and Coherent409  Volume:  Normal  Mood:  Anxious and sad due to her friends death this AM  Affect:  Appropriate  Thought Process:  Coherent and Goal Directed  Orientation:  Full (Time, Place, and Person)  Thought Content:  events worries concerns  Suicidal Thoughts:  No  Homicidal Thoughts:  No  Memory:  Immediate;   Fair Recent;   Fair Remote;   Fair  Judgement:  Fair  Insight:  Present  Psychomotor Activity:  Normal  Concentration:  Fair  Recall:  [employer] of Knowledge:Fair  Language: Fair   Akathisia:  No  Handed:  Right  AIMS (if indicated):     Assets:  Desire for Improvement Housing Social Support  Sleep:  Number of Hours: 6.5  Cognition: WNL  ADL's:  Intact  In full contact with reality. There are no active S/S of withdrawal. There are no active SI plans or intent. He is willing and motivated to work with a Veterinary surgeon on an outpatient basis as well as take the Lexapro and give it a good try understanding it is going to take few weeks before it starts working. She understands that it is going to be stressful going to her friend's funeral tomorrow AM and have the anniversary of her father's death the 35, but states she is ready to deal with them Mental Status Per Nursing Assessment::   On Admission:  NA  Demographic Factors:  Caucasian  Loss Factors: Loss of significant relationship  Historical Factors: Impulsivity  Risk Reduction Factors:   Responsible for children under 1 years of age, Sense of responsibility to family, Living with another person, especially a relative and Positive social support  Continued Clinical Symptoms:  Panic Attacks Alcohol/Substance Abuse/Dependencies  Cognitive Features That Contribute To Risk:  None    Suicide Risk:  Minimal: No identifiable suicidal ideation.  Patients presenting with no risk factors but with morbid ruminations; may be classified as minimal risk based on the severity of the depressive symptoms  Follow-up Information    [facility] with [facility] On 01/25/2016.   Why:  Appt on this date at 2:00PM for assessment (medication management and counseling). Please arrive between 1:30PM-1:45PM to complete new patient paperwork and bring Photo  ID and Medicaid Card. Thank you.    Contact information:   [address] [place], [place] [address] Phone: [phone] Fax: [phone]      Plan Of Care/Follow-up recommendations:  Activity:  as tolerated Diet:  regular Follow up as above Jamar Casagrande A,  MD 01/20/2016, 12:50 PM

## 2016-01-20 NOTE — Tx Team (Addendum)
Interdisciplinary Treatment Plan Update (Adult)  Date:  01/20/2016  Time Reviewed:  8:48 AM   Progress in Treatment: Attending groups: No. New to unit.  Participating in groups:  No. Taking medication as prescribed:  Yes. Tolerating medication:  Yes. Family/Significant othe contact made:  SPE completed with pt's husband. Patient understands diagnosis:  Yes. and As evidenced by:  seeking treatment for SI, crack cocaine/xanax abuse, depression, and for medication stabilization. Discussing patient identified problems/goals with staff:  Yes. Medical problems stabilized or resolved:  Yes. Denies suicidal/homicidal ideation: Yes. Issues/concerns per patient self-inventory:  Other:  Discharge Plan or Barriers: Pt is returning home; follow-up made for [facility] (medication management and counseling).   Reason for Continuation of Hospitalization: None   Comments:  [name] is an 30 y.o. female who presented voluntarily to [facility] for assistance with detox and rehab for her crack & Xanax addiction. Pt disclosed to RN that she was suicidal with a plan to OD on pills. She then denied that she was suicidal and asked to leave. She was subsequently IVC'd. Pt was calm and cooperative during assessment. She denied SI, indicating "that was yesterday". Pt also denied HI, AVH or hx of any. Pt indicated that "I just have a drug problem" and expressed a desire to obtain detox/rehab. Pt reported never being diagnosed with a mental health disorder and never being prescribed any mental health medications. Pt did endorse depressive symptoms including tearfulness, anhedonia, and feelings of worthlessness. Diagnosis: Cocaine use disorder, Severe; Unspecified other (or unknown) substance-related disorder  Estimated length of stay:  D/c today per Dr. Sabra Heck   Additional Comments:  Patient and CSW reviewed pt's identified goals and treatment plan. Patient verbalized understanding and agreed to treatment  plan. CSW reviewed [facility] "Discharge Process and Patient Involvement" Form. Pt verbalized understanding of information provided and signed form.    Review of initial/current patient goals per problem list:  1. Goal(s): Patient will participate in aftercare plan  Met: Yes  Target date: at discharge  As evidenced by: Patient will participate within aftercare plan AEB aftercare provider and housing plan at discharge being identified.  4/5: Appt made with [facility] for medication management and counseling assessment.   2. Goal (s): Patient will exhibit decreased depressive symptoms and suicidal ideations.  Met: Yes.    Target date: at discharge  As evidenced by: Patient will utilize self rating of depression at 3 or below and demonstrate decreased signs of depression or be deemed stable for discharge by MD.  4/5: Pt rates depression as 2/10 and denies SI/HI/AVH.   3. Goal(s): Patient will demonstrate decreased signs of withdrawal due to substance abuse  IEP:PIRJ adequate for discharge.    Target date:at discharge   As evidenced by: Patient will produce a CIWA/COWS score of 0, have stable vitals signs, and no symptoms of withdrawal.  4/5: Pt reports mild withdrawals with COWS of 6 and high pulse. Per Dr. Sabra Heck, pt is medically stable for discharge and will be provided with Librium to complete detox protocol at home.   Attendees: Patient:   01/20/2016 8:48 AM   Family:   01/20/2016 8:48 AM   Physician:  Dr. Carlton Adam, MD 01/20/2016 8:48 AM   Nursing:   Nevada Crane RN 01/20/2016 8:48 AM   Clinical Social Worker: Maxie Better, LCSW 01/20/2016 8:48 AM   Clinical Social Worker: Erasmo Downer Drinkard LCSW 01/20/2016 8:48 AM   Other:  Gerline Legacy Nurse Case Manager 01/20/2016 8:48 AM   Other:  Agustina Caroli NP  01/20/2016 8:48 AM   Other:   01/20/2016 8:48 AM   Other:  01/20/2016 8:48 AM   Other:  01/20/2016 8:48 AM   Other:  01/20/2016 8:48 AM    01/20/2016 8:48 AM    01/20/2016 8:48 AM     01/20/2016 8:48 AM    01/20/2016 8:48 AM    Scribe for Treatment Team:   Maxie Better, LCSW 01/20/2016 8:48 AM
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
## 2016-01-20 NOTE — Progress Notes (Signed)
Discharge note:  Patient discharged home per MD order.  Patient received all personal belongings from locker and unit. Reviewed discharge instructions and follow up appointments with patient and she indicated understanding.  She denies SI/HI/AVH.  Patient will follow up with [facility].  Patient was given first dose of lexapro before discharge.  Reviewed taper on librium protocol with patient.  Patient denies any substance abuse.  She left ambulatory with her husband and daughter.

## 2016-01-20 NOTE — Progress Notes (Signed)
  [facility] Adult Case Management Discharge Plan :  Will you be returning to the same living situation after discharge:  Yes,  home At discharge, do you have transportation home?: Yes,  husband coming at 2pm Do you have the ability to pay for your medications: Yes,  [place] Medicaid  Release of information consent forms completed and submitted to medical records by CSW.   Patient to Follow up at: Follow-up Information    Follow up with [facility] On 01/25/2016.   Why:  Appt on this date at 2:00PM for assessment (medication management and counseling). Please arrive between 1:30PM-1:45PM to complete new patient paperwork and bring Photo ID and Medicaid Card. Thank you.    Contact information:   [address] [place], [place] [address] Phone: [phone] Fax: [phone]      Next level of care provider has access to [facility] Link:no  Safety Planning and Suicide Prevention discussed: Yes,  SPE completed with pt's husband. SPI pamphlet and mobile crisis information provided to pt and she was encouraged to share information with support network.   Have you used any form of tobacco in the last 30 days? (Cigarettes, Smokeless Tobacco, Cigars, and/or Pipes): Yes  Has patient been referred to the Quitline?: Patient refused referral  Patient has been referred for addiction treatment: Yes-see above.   Smart, Jonell Krontz LCSW 01/20/2016, 11:15 AM

## 2016-03-29 ENCOUNTER — Encounter (HOSPITAL_COMMUNITY): Payer: Self-pay | Admitting: Emergency Medicine

## 2016-03-29 ENCOUNTER — Inpatient Hospital Stay (HOSPITAL_COMMUNITY)
Admission: EM | Admit: 2016-03-29 | Discharge: 2016-03-31 | DRG: 638 | Payer: Medicaid Other | Attending: Internal Medicine | Admitting: Internal Medicine

## 2016-03-29 DIAGNOSIS — E871 Hypo-osmolality and hyponatremia: Secondary | ICD-10-CM | POA: Diagnosis present

## 2016-03-29 DIAGNOSIS — Z23 Encounter for immunization: Secondary | ICD-10-CM

## 2016-03-29 DIAGNOSIS — E1110 Type 2 diabetes mellitus with ketoacidosis without coma: Secondary | ICD-10-CM | POA: Diagnosis present

## 2016-03-29 DIAGNOSIS — E0810 Diabetes mellitus due to underlying condition with ketoacidosis without coma: Secondary | ICD-10-CM

## 2016-03-29 DIAGNOSIS — F17210 Nicotine dependence, cigarettes, uncomplicated: Secondary | ICD-10-CM | POA: Diagnosis present

## 2016-03-29 DIAGNOSIS — Z8249 Family history of ischemic heart disease and other diseases of the circulatory system: Secondary | ICD-10-CM

## 2016-03-29 DIAGNOSIS — I959 Hypotension, unspecified: Secondary | ICD-10-CM | POA: Diagnosis present

## 2016-03-29 DIAGNOSIS — Z794 Long term (current) use of insulin: Secondary | ICD-10-CM

## 2016-03-29 DIAGNOSIS — Z833 Family history of diabetes mellitus: Secondary | ICD-10-CM | POA: Diagnosis not present

## 2016-03-29 DIAGNOSIS — E1011 Type 1 diabetes mellitus with ketoacidosis with coma: Secondary | ICD-10-CM | POA: Diagnosis not present

## 2016-03-29 DIAGNOSIS — Z801 Family history of malignant neoplasm of trachea, bronchus and lung: Secondary | ICD-10-CM | POA: Diagnosis not present

## 2016-03-29 DIAGNOSIS — N179 Acute kidney failure, unspecified: Secondary | ICD-10-CM | POA: Diagnosis present

## 2016-03-29 DIAGNOSIS — R748 Abnormal levels of other serum enzymes: Secondary | ICD-10-CM | POA: Diagnosis not present

## 2016-03-29 DIAGNOSIS — R739 Hyperglycemia, unspecified: Secondary | ICD-10-CM | POA: Diagnosis present

## 2016-03-29 DIAGNOSIS — E1069 Type 1 diabetes mellitus with other specified complication: Secondary | ICD-10-CM | POA: Diagnosis present

## 2016-03-29 DIAGNOSIS — E1010 Type 1 diabetes mellitus with ketoacidosis without coma: Secondary | ICD-10-CM | POA: Diagnosis present

## 2016-03-29 DIAGNOSIS — E1065 Type 1 diabetes mellitus with hyperglycemia: Secondary | ICD-10-CM

## 2016-03-29 DIAGNOSIS — D72829 Elevated white blood cell count, unspecified: Secondary | ICD-10-CM | POA: Diagnosis not present

## 2016-03-29 LAB — BASIC METABOLIC PANEL
ANION GAP: 23 — AB (ref 5–15)
ANION GAP: 7 (ref 5–15)
BUN: 34 mg/dL — AB (ref 6–20)
BUN: 24 mg/dL — ABNORMAL HIGH (ref 6–20)
CALCIUM: 7.6 mg/dL — AB (ref 8.9–10.3)
CO2: 13 mmol/L — ABNORMAL LOW (ref 22–32)
CO2: 17 mmol/L — ABNORMAL LOW (ref 22–32)
CREATININE: 0.72 mg/dL (ref 0.44–1.00)
Calcium: 9.6 mg/dL (ref 8.9–10.3)
Chloride: 93 mmol/L — ABNORMAL LOW (ref 101–111)
Chloride: 111 mmol/L (ref 101–111)
Creatinine, Ser: 1.19 mg/dL — ABNORMAL HIGH (ref 0.44–1.00)
GFR, EST NON AFRICAN AMERICAN: 60 mL/min — AB (ref >60)
GLUCOSE: 232 mg/dL — AB (ref 65–99)
Glucose, Bld: 536 mg/dL — ABNORMAL HIGH (ref 65–99)
POTASSIUM: 4.9 mmol/L (ref 3.5–5.1)
Potassium: 4.4 mmol/L (ref 3.5–5.1)
SODIUM: 129 mmol/L — AB (ref 135–145)
Sodium: 135 mmol/L (ref 135–145)

## 2016-03-29 LAB — CBG MONITORING, ED
GLUCOSE-CAPILLARY: 543 mg/dL — AB (ref 65–99)
Glucose-Capillary: 427 mg/dL — ABNORMAL HIGH (ref 65–99)

## 2016-03-29 LAB — URINALYSIS, ROUTINE W REFLEX MICROSCOPIC
Bilirubin Urine: NEGATIVE
Ketones, ur: 40 mg/dL — AB
NITRITE: NEGATIVE
PH: 5.5 (ref 5.0–8.0)
PROTEIN: NEGATIVE mg/dL
Specific Gravity, Urine: 1.025 (ref 1.005–1.030)

## 2016-03-29 LAB — HEPATIC FUNCTION PANEL
ALBUMIN: 4.5 g/dL (ref 3.5–5.0)
ALT: 18 U/L (ref 14–54)
AST: 24 U/L (ref 15–41)
Alkaline Phosphatase: 86 U/L (ref 38–126)
BILIRUBIN DIRECT: 0.1 mg/dL (ref 0.1–0.5)
BILIRUBIN TOTAL: 1.3 mg/dL — AB (ref 0.3–1.2)
Indirect Bilirubin: 1.2 mg/dL — ABNORMAL HIGH (ref 0.3–0.9)
Total Protein: 8.4 g/dL — ABNORMAL HIGH (ref 6.5–8.1)

## 2016-03-29 LAB — URINE MICROSCOPIC-ADD ON

## 2016-03-29 LAB — GLUCOSE, CAPILLARY
GLUCOSE-CAPILLARY: 187 mg/dL — AB (ref 65–99)
GLUCOSE-CAPILLARY: 202 mg/dL — AB (ref 65–99)
GLUCOSE-CAPILLARY: 214 mg/dL — AB (ref 65–99)
GLUCOSE-CAPILLARY: 390 mg/dL — AB (ref 65–99)
Glucose-Capillary: 246 mg/dL — ABNORMAL HIGH (ref 65–99)
Glucose-Capillary: 201 mg/dL — ABNORMAL HIGH (ref 65–99)

## 2016-03-29 LAB — CBC
HEMATOCRIT: 44.4 % (ref 36.0–46.0)
Hemoglobin: 15.2 g/dL — ABNORMAL HIGH (ref 12.0–15.0)
MCH: 28.6 pg (ref 26.0–34.0)
MCHC: 34.2 g/dL (ref 30.0–36.0)
MCV: 83.6 fL (ref 78.0–100.0)
Platelets: 288 10*3/uL (ref 150–400)
RBC: 5.31 MIL/uL — ABNORMAL HIGH (ref 3.87–5.11)
RDW: 14.8 % (ref 11.5–15.5)
WBC: 20.7 10*3/uL — AB (ref 4.0–10.5)

## 2016-03-29 LAB — LIPASE, BLOOD: Lipase: <10 U/L — ABNORMAL LOW (ref 11–51)

## 2016-03-29 LAB — PHOSPHORUS: Phosphorus: 6.7 mg/dL — ABNORMAL HIGH (ref 2.5–4.6)

## 2016-03-29 LAB — PREGNANCY, URINE: Preg Test, Ur: NEGATIVE

## 2016-03-29 LAB — MRSA PCR SCREENING: MRSA BY PCR: NEGATIVE

## 2016-03-29 LAB — MAGNESIUM: Magnesium: 2 mg/dL (ref 1.7–2.4)

## 2016-03-29 MED ORDER — DEXTROSE-NACL 5-0.45 % IV SOLN
INTRAVENOUS | Status: DC
Start: 2016-03-29 — End: 2016-03-29

## 2016-03-29 MED ORDER — SODIUM CHLORIDE 0.9 % IV BOLUS (SEPSIS)
1000.0000 mL | Freq: Once | INTRAVENOUS | Status: AC
  Administered 2016-03-29: 1000 mL via INTRAVENOUS

## 2016-03-29 MED ORDER — POTASSIUM CHLORIDE 10 MEQ/100ML IV SOLN
10.0000 meq | INTRAVENOUS | Status: AC
  Administered 2016-03-29 (×2): 10 meq via INTRAVENOUS
  Filled 2016-03-29 (×2): qty 100

## 2016-03-29 MED ORDER — SODIUM CHLORIDE 0.9 % IV SOLN
INTRAVENOUS | Status: DC
  Administered 2016-03-29: 18:00:00 via INTRAVENOUS

## 2016-03-29 MED ORDER — METOCLOPRAMIDE HCL 5 MG/ML IJ SOLN
INTRAMUSCULAR | Status: AC
  Filled 2016-03-29: qty 2

## 2016-03-29 MED ORDER — ONDANSETRON HCL 4 MG PO TABS: 4.0000 mg | ORAL_TABLET | Freq: Four times a day (QID) | ORAL | Status: DC | PRN

## 2016-03-29 MED ORDER — ENOXAPARIN SODIUM 40 MG/0.4ML ~~LOC~~ SOLN
40.0000 mg | SUBCUTANEOUS | Status: DC
Start: 2016-03-29 — End: 2016-03-31

## 2016-03-29 MED ORDER — ACETAMINOPHEN 650 MG RE SUPP: 650.0000 mg | Freq: Four times a day (QID) | RECTAL | Status: DC | PRN

## 2016-03-29 MED ORDER — ONDANSETRON HCL 4 MG/2ML IJ SOLN
4.0000 mg | Freq: Once | INTRAMUSCULAR | Status: AC
  Administered 2016-03-29: 4 mg via INTRAVENOUS

## 2016-03-29 MED ORDER — PNEUMOCOCCAL VAC POLYVALENT 25 MCG/0.5ML IJ INJ: 0.5000 mL | INJECTION | INTRAMUSCULAR | Status: DC

## 2016-03-29 MED ORDER — INSULIN REGULAR HUMAN 100 UNIT/ML IJ SOLN
INTRAMUSCULAR | Status: DC
  Administered 2016-03-29: 3.7 [IU]/h via INTRAVENOUS
  Filled 2016-03-29: qty 2.5

## 2016-03-29 MED ORDER — ONDANSETRON HCL 4 MG/2ML IJ SOLN
INTRAMUSCULAR | Status: AC
  Filled 2016-03-29: qty 2

## 2016-03-29 MED ORDER — ONDANSETRON HCL 4 MG/2ML IJ SOLN: 4.0000 mg | Freq: Four times a day (QID) | INTRAMUSCULAR | Status: DC | PRN

## 2016-03-29 MED ORDER — METOCLOPRAMIDE HCL 5 MG/ML IJ SOLN
10.0000 mg | Freq: Once | INTRAMUSCULAR | Status: AC
  Administered 2016-03-29: 10 mg via INTRAVENOUS

## 2016-03-29 MED ORDER — SODIUM CHLORIDE 0.9% FLUSH
3.0000 mL | Freq: Two times a day (BID) | INTRAVENOUS | Status: DC
  Administered 2016-03-29 – 2016-03-30 (×2): 3 mL via INTRAVENOUS

## 2016-03-29 MED ORDER — ONDANSETRON HCL 4 MG/2ML IJ SOLN
INTRAMUSCULAR | Status: AC
  Administered 2016-03-29: 4 mg via INTRAVENOUS
  Filled 2016-03-29: qty 2

## 2016-03-29 MED ORDER — DEXTROSE-NACL 5-0.45 % IV SOLN
INTRAVENOUS | Status: DC
  Administered 2016-03-29 – 2016-03-30 (×2): via INTRAVENOUS

## 2016-03-29 MED ORDER — ACETAMINOPHEN 325 MG PO TABS
650.0000 mg | ORAL_TABLET | Freq: Four times a day (QID) | ORAL | Status: DC | PRN
  Administered 2016-03-29: 650 mg via ORAL
  Filled 2016-03-29 (×2): qty 2

## 2016-03-29 MED ORDER — SODIUM CHLORIDE 0.9 % IV BOLUS (SEPSIS)
2000.0000 mL | Freq: Once | INTRAVENOUS | Status: AC
  Administered 2016-03-29: 2000 mL via INTRAVENOUS

## 2016-03-29 MED ORDER — SODIUM CHLORIDE 0.9 % IV SOLN
INTRAVENOUS | Status: DC
  Filled 2016-03-29: qty 2.5

## 2016-03-29 NOTE — ED Notes (Signed)
Pt reports vomiting and general malaise. States she has not been given insulin in 5 days. Pt CBG 543 in triage.

## 2016-03-29 NOTE — H&P (Signed)
History and Physical  Patient Name: [name]     ZOX:[id number]    DOB: [birth year]    DOA: 03/29/2016 PCP: Ernestine Conrad, MD   Patient coming from: [place]  Chief Complaint: Vomiting and malaise  HPI: [name] is a 30 y.o. female with a past medical history significant for T1DM uncontrolled who presents with vomiting and 5 days no insulin.  The patient was in her usual state of health until 5 days ago when she was put in jail for a wart regarding findings. Since then she has not had any insulin.  Over the last 3 days she has had worsening thirst and polyuria.  Today, she started vomiting, developed generalized pain, malaise and abdominal discomfort, and so was brought to the ER.  ED course: -Afebrile, tachycardic, normotensive, mentating well -Na 129, K 4.9, Cr 1.10 (baseline 0.5), WBC 20.7K, Hgb 15 -HCO3 13, AG 23, blood sugar 543, ketonuria -Insulin drip and 3L NS were administered and TRH were asked to evaluate for admission.  Normally she takes Lantus 40 nightly and a sliding scale short acting with meals (I think she gives this by guesswork, because she tells me that she takes a sliding scale based on her pre-meal BG but also that she gives herself 1 unit per "50 grams" of carbs).    Review of Systems:  Pt complains of generalized malaise, chest discomfort, abdominal discomfort, back discomfort, neck discomfort, polyuria, polydipsia, chills, vomiting, malaise. Pt denies any fever, cough, dyspnea, purulent sputum, dysuria, urinary urgency, suprapubic pain.  All other systems negative except as just noted or noted in the history of present illness.    Past Medical History  Diagnosis Date  . Diabetes mellitus   . DKA 05/01/2011    Past Surgical History  Procedure Laterality Date  . Tubal ligation    . Wound exploration Left 07/26/2014    Procedure: Irrigation, debridement and exploration of elbow wounds;  Surgeon: Betha Loa, MD;  Location: [facility] OR;  Service:  Orthopedics;  Laterality: Left;    Social History: Patient lives With her husband and children. She works at [employer]. She was reviewed recently incarcerated for 1. She smokes.  The patient walks without assistance.    Allergies  Allergen Reactions  . Penicillins Anaphylaxis    .[name] KitchenHas patient had a PCN reaction causing immediate rash, facial/tongue/throat swelling, SOB or lightheadedness with hypotension: Yes Has patient had a PCN reaction causing severe rash involving mucus membranes or skin necrosis: Yes Has patient had a PCN reaction that required hospitalization Yes Has patient had a PCN reaction occurring within the last 10 years: No If all of the above answers are "NO", then may proceed with Cephalosporin use.   . Codeine Nausea And Vomiting  . Tramadol Itching    Family history: family history includes Coronary artery disease in her mother; Diabetes type I in her daughter; Heart attack (age of onset: 43) in her father; Lung cancer in her mother; Thyroid disease in her maternal grandmother.  Prior to Admission medications   Medication Sig Start Date End Date Taking? Authorizing Provider  insulin aspart (NOVOLOG) 100 UNIT/ML injection Inject 1-10 Units into the skin 3 (three) times daily as needed for high blood sugar. Per sliding scale instructions per the patient: 150-250=3  251-350=4 units  351-400+=10 units   Yes Historical Provider, MD  insulin glargine (LANTUS) 100 UNIT/ML injection Inject 0.4 mLs (40 Units total) into the skin at bedtime. 01/20/16  Yes Adonis Brook, NP  Physical Exam: BP 129/77 mmHg  Pulse 122  Temp(Src) 98.3 F (36.8 C) (Temporal)  Resp 20  Ht 5' (1.524 m)  Wt 49.896 kg (110 lb)  BMI 21.48 kg/m2  SpO2 100%  LMP 03/29/2016 General appearance: Thin adult female, alert and in no acute distress.   Eyes: Anicteric, conjunctiva pink, lids and lashes normal.     ENT: No nasal deformity, discharge, or epistaxis.  OP pasty without lesions.   Dentition poor.   Lymph: No cervical or supraclavicular lymphadenopathy. Skin: Warm and dry.  No jaundice.  No suspicious rashes or lesions. Cardiac: Tachycardic, regular, nl S1-S2, no murmurs appreciated.  Capillary refill is brisk.  No LE edema.  Radial and DP pulses 2+ and symmetric. Respiratory: Normal respiratory rate and rhythm.  CTAB without rales or wheezes. Abdomen: Abdomen soft without rigidity.  No TTP. No ascites, distension.   MSK: No deformities or effusions. Neuro: Sensorium intact and responding to questions, attention normal.  Speech is fluent.  Moves all extremities equally and with normal coordination.    Psych: Behavior appropriate.  Affect normal.  No evidence of aural or visual hallucinations or delusions.       Labs on Admission:  I have personally reviewed following labs and imaging studies: CBC:  Recent Labs Lab 03/29/16 1500  WBC 20.7*  HGB 15.2*  HCT 44.4  MCV 83.6  PLT 288   Basic Metabolic Panel:  Recent Labs Lab 03/29/16 1500  NA 129*  K 4.9  CL 93*  CO2 13*  GLUCOSE 536*  BUN 34*  CREATININE 1.19*  CALCIUM 9.6   GFR: Estimated Creatinine Clearance: 48.8 mL/min (by C-G formula based on Cr of 1.19). Liver Function Tests:  Recent Labs Lab 03/29/16 1529  AST 24  ALT 18  ALKPHOS 86  BILITOT 1.3*  PROT 8.4*  ALBUMIN 4.5    Recent Labs Lab 03/29/16 1529  LIPASE <10*   CBG:  Recent Labs Lab 03/29/16 1453  GLUCAP 543*         Assessment/Plan 1. DKA:  Bicarb 13, gap elevated, hyperglycemia and ketonuria. -Insulin drip and fluids started in ER, will continue -Check mag, phos, and q4hrs BMP -Admit to stepdown on telemetry monitoring -K 20 mEQ per protocol -Check HgbA1c -Home insulin regimen 40 units lantus and 1:15? Lispro-carbs ratio   2. Leukocytosis:  Reactive from #1.  3. AKI vs pre-renal injury:  Likely from #1. -Fluids and trend creatinine  4. Pyuria:  No symptoms of UTI.  DKA seems clearly precipitated  by lack of insulin, not infection. -Culture urine, cephalexin if growth or symptoms by discharge      DVT prophylaxis: Lovenox  Code Status: FULL  Family Communication: None present  Disposition Plan: Anticipate aggressive fluids, IV insulin, close monitoring of electrolytes. Consults called: None Admission status: Inpatient, stepdown    Medical decision making: Patient seen at 5:01 PM on 03/29/2016.  The patient was discussed with Dr. Estell Harpin. What exists of the patient's chart was reviewed in depth.  Clinical condition: unstable and acidotic.        Alberteen Sam [facility] Pager [phone]    At the time of admission, it appears that the appropriate admission status for this patient is INPATIENT. This is judged to be reasonable and necessary in order to provide the required intensity of service to ensure the patient's safety given the presenting symptoms, physical exam findings, and initial radiographic and laboratory data in the context of their chronic comorbidities.  Together, these circumstances  are felt to place her/him at high risk for further clinical deterioration threatening life, limb, or organ. The following factors support the admission status of inpatient:   A. The patient's presenting symptoms include malaise, abdominal pain, polyuria, polydipsia, vomiting. B. The worrisome physical exam findings include dry mucus membranes, tachycardia. C. The initial radiographic and laboratory data are worrisome because of acidosis, acute decrease in renal function, hyponatremia, leukocytosis.   D. The chronic co-morbidities include type 1 diabetes. E. Patient requires inpatient status due to high intensity of service, high risk for further deterioration and high frequency of surveillance required. F. I certify that at the point of admission it is my clinical judgment that the patient will require inpatient hospital care spanning beyond 2 midnights from the point of  admission.

## 2016-03-29 NOTE — ED Provider Notes (Signed)
CSN: [id number]     Arrival date & time 03/29/16  1444 History   First MD Initiated Contact with Patient 03/29/16 1505     Chief Complaint  Patient presents with  . Hyperglycemia     (Consider location/radiation/quality/duration/timing/severity/associated sxs/prior Treatment) Patient is a 30 y.o. female presenting with hyperglycemia. The history is provided by the patient (Patient complains of vomiting she states she's not had her insulin for 5 days).  Hyperglycemia Severity:  Moderate Onset quality:  Sudden Timing:  Constant Progression:  Waxing and waning Chronicity:  New Diabetes status:  Controlled with insulin Associated symptoms: nausea and vomiting   Associated symptoms: no abdominal pain, no chest pain and no fatigue     Past Medical History  Diagnosis Date  . Diabetes mellitus   . DKA 05/01/2011   Past Surgical History  Procedure Laterality Date  . Tubal ligation    . Wound exploration Left 07/26/2014    Procedure: Irrigation, debridement and exploration of elbow wounds;  Surgeon: Betha Loa, MD;  Location: [facility] OR;  Service: Orthopedics;  Laterality: Left;   Family History  Problem Relation Age of Onset  . Coronary artery disease Mother   . Lung cancer Mother   . Heart attack Father 78  . Thyroid disease Maternal Grandmother   . Diabetes type I Daughter    Social History  Substance Use Topics  . Smoking status: Current Every Day Smoker -- 1.00 packs/day for 12 years    Types: Cigarettes  . Smokeless tobacco: Never Used     Comment: She is currently trying to quit.  . Alcohol Use: No   OB History    Gravida Para Term Preterm AB TAB SAB Ectopic Multiple Living   2 2             Review of Systems  Constitutional: Negative for appetite change and fatigue.  HENT: Negative for congestion, ear discharge and sinus pressure.   Eyes: Negative for discharge.  Respiratory: Negative for cough.   Cardiovascular: Negative for chest pain.  Gastrointestinal:  Positive for nausea and vomiting. Negative for abdominal pain and diarrhea.  Genitourinary: Negative for frequency and hematuria.  Musculoskeletal: Negative for back pain.  Skin: Negative for rash.  Neurological: Negative for seizures and headaches.  Psychiatric/Behavioral: Negative for hallucinations.      Allergies  Penicillins; Codeine; and Tramadol  Home Medications   Prior to Admission medications   Medication Sig Start Date End Date Taking? Authorizing Provider  insulin aspart (NOVOLOG) 100 UNIT/ML injection Inject 1-10 Units into the skin 3 (three) times daily as needed for high blood sugar. Per sliding scale instructions per the patient: 150-250=3  251-350=4 units  351-400+=10 units   Yes Historical Provider, MD  insulin glargine (LANTUS) 100 UNIT/ML injection Inject 0.4 mLs (40 Units total) into the skin at bedtime. 01/20/16  Yes Adonis Brook, NP  traZODone (DESYREL) 50 MG tablet Take 1 tablet (50 mg total) by mouth at bedtime as needed for sleep. Patient not taking: Reported on 03/29/2016 01/20/16   Adonis Brook, NP   BP 129/77 mmHg  Pulse 122  Temp(Src) 98.3 F (36.8 C) (Temporal)  Resp 20  Ht 5' (1.524 m)  Wt 110 lb (49.896 kg)  BMI 21.48 kg/m2  SpO2 100%  LMP 03/29/2016 Physical Exam  Constitutional: She is oriented to person, place, and time. She appears well-developed.  HENT:  Head: Normocephalic.  Eyes: Conjunctivae and EOM are normal. No scleral icterus.  Neck: Neck supple. No thyromegaly present.  Cardiovascular: Normal rate and regular rhythm.  Exam reveals no gallop and no friction rub.   No murmur heard. Pulmonary/Chest: No stridor. She has no wheezes. She has no rales. She exhibits no tenderness.  Abdominal: She exhibits no distension. There is tenderness. There is no rebound.  Mild tenderness throughout  Musculoskeletal: Normal range of motion. She exhibits no edema.  Lymphadenopathy:    She has no cervical adenopathy.  Neurological: She is  oriented to person, place, and time. She exhibits normal muscle tone. Coordination normal.  Skin: No rash noted. No erythema.  Psychiatric: She has a normal mood and affect. Her behavior is normal.    ED Course  Procedures (including critical care time) Labs Review Labs Reviewed  BASIC METABOLIC PANEL - Abnormal; Notable for the following:    Sodium 129 (*)    Chloride 93 (*)    CO2 13 (*)    Glucose, Bld 536 (*)    BUN 34 (*)    Creatinine, Ser 1.19 (*)    GFR calc non Af Amer 60 (*)    Anion gap 23 (*)    All other components within normal limits  CBC - Abnormal; Notable for the following:    WBC 20.7 (*)    RBC 5.31 (*)    Hemoglobin 15.2 (*)    All other components within normal limits  URINALYSIS, ROUTINE W REFLEX MICROSCOPIC (NOT AT [facility]) - Abnormal; Notable for the following:    APPearance HAZY (*)    Glucose, UA >1000 (*)    Hgb urine dipstick SMALL (*)    Ketones, ur 40 (*)    Leukocytes, UA TRACE (*)    All other components within normal limits  HEPATIC FUNCTION PANEL - Abnormal; Notable for the following:    Total Protein 8.4 (*)    Total Bilirubin 1.3 (*)    Indirect Bilirubin 1.2 (*)    All other components within normal limits  LIPASE, BLOOD - Abnormal; Notable for the following:    Lipase <10 (*)    All other components within normal limits  URINE MICROSCOPIC-ADD ON - Abnormal; Notable for the following:    Squamous Epithelial / LPF 0-5 (*)    Bacteria, UA FEW (*)    All other components within normal limits  CBG MONITORING, ED - Abnormal; Notable for the following:    Glucose-Capillary 543 (*)    All other components within normal limits  PREGNANCY, URINE  CBG MONITORING, ED    Imaging Review No results found. I have personally reviewed and evaluated these images and lab results as part of my medical decision-making.   EKG Interpretation None     CRITICAL CARE Performed by: Geena Weinhold L Total critical care time: Critical care  time was exclusive of separately billable procedures and treating other patients. Critical care was necessary to treat or prevent imminent or life-threatening deterioration. Critical care was time spent personally by me on the following activities: development of treatment plan with patient and/or surrogate as well as nursing, discussions with consultants, evaluation of patient's response to treatment, examination of patient, obtaining history from patient or surrogate, ordering and performing treatments and interventions, ordering and review of laboratory studies, ordering and review of radiographic studies, pulse oximetry and re-evaluation of patient's condition.   MDM   Final diagnoses:  Diabetic ketoacidosis without coma associated with diabetes mellitus due to underlying condition [facility])       Bethann Berkshire, MD 03/29/16 [phone]

## 2016-03-30 ENCOUNTER — Inpatient Hospital Stay (HOSPITAL_COMMUNITY): Payer: Medicaid Other

## 2016-03-30 DIAGNOSIS — E1011 Type 1 diabetes mellitus with ketoacidosis with coma: Secondary | ICD-10-CM

## 2016-03-30 LAB — CBC
HCT: 34.1 % — ABNORMAL LOW (ref 36.0–46.0)
Hemoglobin: 12 g/dL (ref 12.0–15.0)
MCH: 29.2 pg (ref 26.0–34.0)
MCHC: 35.2 g/dL (ref 30.0–36.0)
MCV: 83 fL (ref 78.0–100.0)
PLATELETS: 220 10*3/uL (ref 150–400)
RBC: 4.11 MIL/uL (ref 3.87–5.11)
RDW: 14.9 % (ref 11.5–15.5)
WBC: 23.5 10*3/uL — AB (ref 4.0–10.5)

## 2016-03-30 LAB — GLUCOSE, CAPILLARY
GLUCOSE-CAPILLARY: 127 mg/dL — AB (ref 65–99)
GLUCOSE-CAPILLARY: 110 mg/dL — AB (ref 65–99)
GLUCOSE-CAPILLARY: 114 mg/dL — AB (ref 65–99)
GLUCOSE-CAPILLARY: 134 mg/dL — AB (ref 65–99)
GLUCOSE-CAPILLARY: 175 mg/dL — AB (ref 65–99)
Glucose-Capillary: 117 mg/dL — ABNORMAL HIGH (ref 65–99)
Glucose-Capillary: 125 mg/dL — ABNORMAL HIGH (ref 65–99)
Glucose-Capillary: 133 mg/dL — ABNORMAL HIGH (ref 65–99)
Glucose-Capillary: 139 mg/dL — ABNORMAL HIGH (ref 65–99)
Glucose-Capillary: 143 mg/dL — ABNORMAL HIGH (ref 65–99)
Glucose-Capillary: 170 mg/dL — ABNORMAL HIGH (ref 65–99)

## 2016-03-30 LAB — BASIC METABOLIC PANEL
ANION GAP: 3 — AB (ref 5–15)
BUN: 15 mg/dL (ref 6–20)
BUN: 18 mg/dL (ref 6–20)
CALCIUM: 7.5 mg/dL — AB (ref 8.9–10.3)
CALCIUM: 7.5 mg/dL — AB (ref 8.9–10.3)
CO2: 22 mmol/L (ref 22–32)
CO2: 20 mmol/L — AB (ref 22–32)
Chloride: 110 mmol/L (ref 101–111)
Chloride: 111 mmol/L (ref 101–111)
Creatinine, Ser: 0.53 mg/dL (ref 0.44–1.00)
Creatinine, Ser: 0.61 mg/dL (ref 0.44–1.00)
GFR calc non Af Amer: >60 mL/min (ref >60)
GLUCOSE: 97 mg/dL (ref 65–99)
Glucose, Bld: 180 mg/dL — ABNORMAL HIGH (ref 65–99)
POTASSIUM: 3.5 mmol/L (ref 3.5–5.1)
Potassium: 3.5 mmol/L (ref 3.5–5.1)
SODIUM: 134 mmol/L — AB (ref 135–145)
SODIUM: 134 mmol/L — AB (ref 135–145)

## 2016-03-30 LAB — HEMOGLOBIN A1C
HEMOGLOBIN A1C: 10.9 % — AB (ref 4.8–5.6)
MEAN PLASMA GLUCOSE: 266 mg/dL

## 2016-03-30 MED ORDER — INSULIN ASPART 100 UNIT/ML ~~LOC~~ SOLN
0.0000 [IU] | Freq: Three times a day (TID) | SUBCUTANEOUS | Status: DC
  Administered 2016-03-30: 1 [IU] via SUBCUTANEOUS
  Administered 2016-03-30: 2 [IU] via SUBCUTANEOUS
  Administered 2016-03-30 – 2016-03-31 (×2): 1 [IU] via SUBCUTANEOUS

## 2016-03-30 MED ORDER — INSULIN ASPART 100 UNIT/ML ~~LOC~~ SOLN
0.0000 [IU] | Freq: Every day | SUBCUTANEOUS | Status: DC
Start: 2016-03-30 — End: 2016-03-31

## 2016-03-30 MED ORDER — SODIUM CHLORIDE 0.9 % IV BOLUS (SEPSIS)
500.0000 mL | Freq: Once | INTRAVENOUS | Status: AC
  Administered 2016-03-30: 500 mL via INTRAVENOUS

## 2016-03-30 MED ORDER — SODIUM CHLORIDE 0.9 % IV SOLN
INTRAVENOUS | Status: DC
  Administered 2016-03-30: 12:00:00 via INTRAVENOUS

## 2016-03-30 MED ORDER — INSULIN GLARGINE 100 UNIT/ML ~~LOC~~ SOLN
25.0000 [IU] | SUBCUTANEOUS | Status: DC
  Administered 2016-03-30 – 2016-03-31 (×2): 25 [IU] via SUBCUTANEOUS
  Filled 2016-03-30 (×3): qty 0.25

## 2016-03-30 NOTE — Progress Notes (Signed)
Called report to Earnstine Regal, RN on dept 300. Verbalized understanding. Pt transferred to room 302 in safe and stable condition.

## 2016-03-30 NOTE — Progress Notes (Signed)
PROGRESS NOTE    [name]  ZOX:[id number] DOB: 1986/03/02 DOA: 03/29/2016 PCP: Ernestine Conrad, MD     Brief Narrative:  30 year old woman with type 1 diabetes who was [facility] about 5 days ago. Presented with vomiting and fatigue, found to be in DKA. Patient states she has not received any insulin since being admitted to prison.   Assessment & Plan:   Principal Problem:   DKA Active Problems:   Diabetes mellitus type 1, uncontrolled (HCC)   Leukocytosis   Diabetic ketoacidosis -Resolved, has been transitioned off IV insulin. -CBGs remain well controlled. -Continue Lantus at roughly half her home dose as well as sliding scale of insulin.  Hypotension -Systolic blood pressure remains in the 80s. -Suspect likely due to hyperosmolar diuresis secondary to DKA. -Give saline bolus and increased maintenance IV fluids. -Blood pressure remains stable, can be transferred to regular medical floor later today.  Acute renal failure -Secondary to DKA, resolved.  Leukocytosis -Could simply be reactive secondary to DKA, however will check chest x-ray to rule out infective process. Urinalysis shows pyuria without concrete evidence for infection, continue to hold antibiotics for now.   DVT prophylaxis: Lovenox Code Status: Full code Family Communication: Patient only Disposition Plan: Likely back to prison in 24 hours  Consultants:   None  Procedures:   None  Antimicrobials:   None    Subjective: Still feels nauseous, some epigastric abdominal pain  Objective: Filed Vitals:   03/30/16 0400 03/30/16 0500 03/30/16 0600 03/30/16 0800  BP: 81/45  87/41   Pulse: 86  76   Temp: 99 F (37.2 C)   98.6 F (37 C)  TempSrc: Oral   Oral  Resp: 18  18   Height:      Weight:  48.3 kg (106 lb 7.7 oz)    SpO2: 99%  98%     Intake/Output Summary (Last 24 hours) at 03/30/16 1148 Last data filed at 03/30/16 0830  Gross per 24 hour  Intake 2089.41 ml    Output      0 ml  Net 2089.41 ml   Filed Weights   03/29/16 1454 03/29/16 1747 03/30/16 0500  Weight: 49.896 kg (110 lb) 46 kg (101 lb 6.6 oz) 48.3 kg (106 lb 7.7 oz)    Examination:  General exam: Alert, awake, oriented x 3 Respiratory system: Clear to auscultation. Respiratory effort normal. Cardiovascular system:Tachycardic, regular rhythm No murmurs, rubs, gallops. Gastrointestinal system: Abdomen is nondistended, soft and nontender. No organomegaly or masses felt. Normal bowel sounds heard. Central nervous system: Alert and oriented. No focal neurological deficits. Extremities: No C/C/E, +pedal pulses Skin: No rashes, lesions or ulcers Psychiatry: Judgement and insight appear normal. Mood & affect appropriate.     Data Reviewed: I have personally reviewed following labs and imaging studies  CBC:  Recent Labs Lab 03/29/16 1500 03/30/16 0414  WBC 20.7* 23.5*  HGB 15.2* 12.0  HCT 44.4 34.1*  MCV 83.6 83.0  PLT 288 220   Basic Metabolic Panel:  Recent Labs Lab 03/29/16 1500 03/29/16 1954 03/30/16 0006 03/30/16 0414  NA 129* 135 134* 134*  K 4.9 4.4 3.5 3.5  CL 93* 111 111 110  CO2 13* 17* 20* 22  GLUCOSE 536* 232* 180* 97  BUN 34* 24* 18 15  CREATININE 1.19* 0.72 0.61 0.53  CALCIUM 9.6 7.6* 7.5* 7.5*  MG 2.0  --   --   --   PHOS 6.7*  --   --   --  GFR: Estimated Creatinine Clearance: 72.5 mL/min (by C-G formula based on Cr of 0.53). Liver Function Tests:  Recent Labs Lab 03/29/16 1529  AST 24  ALT 18  ALKPHOS 86  BILITOT 1.3*  PROT 8.4*  ALBUMIN 4.5    Recent Labs Lab 03/29/16 1529  LIPASE <10*   No results for input(s): AMMONIA in the last 168 hours. Coagulation Profile: No results for input(s): INR, PROTIME in the last 168 hours. Cardiac Enzymes: No results for input(s): CKTOTAL, CKMB, CKMBINDEX, TROPONINI in the last 168 hours. BNP (last 3 results) No results for input(s): PROBNP in the last 8760 hours. HbA1C:  Recent Labs   03/29/16 1500  HGBA1C 10.9*   CBG:  Recent Labs Lab 03/30/16 0333 03/30/16 0431 03/30/16 0549 03/30/16 0648 03/30/16 0747  GLUCAP 127* 110* 114* 125* 134*   Lipid Profile: No results for input(s): CHOL, HDL, LDLCALC, TRIG, CHOLHDL, LDLDIRECT in the last 72 hours. Thyroid Function Tests: No results for input(s): TSH, T4TOTAL, FREET4, T3FREE, THYROIDAB in the last 72 hours. Anemia Panel: No results for input(s): VITAMINB12, FOLATE, FERRITIN, TIBC, IRON, RETICCTPCT in the last 72 hours. Urine analysis:    Component Value Date/Time   COLORURINE YELLOW 03/29/2016 1522   APPEARANCEUR HAZY* 03/29/2016 1522   LABSPEC 1.025 03/29/2016 1522   PHURINE 5.5 03/29/2016 1522   GLUCOSEU >1000* 03/29/2016 1522   HGBUR SMALL* 03/29/2016 1522   BILIRUBINUR NEGATIVE 03/29/2016 1522   KETONESUR 40* 03/29/2016 1522   PROTEINUR NEGATIVE 03/29/2016 1522   UROBILINOGEN 1.0 07/28/2015 1402   NITRITE NEGATIVE 03/29/2016 1522   LEUKOCYTESUR TRACE* 03/29/2016 1522   Sepsis Labs: @LABRCNTIP (procalcitonin:4,lacticidven:4)  ) Recent Results (from the past 240 hour(s))  MRSA PCR Screening     Status: None   Collection Time: 03/29/16  6:00 PM  Result Value Ref Range Status   MRSA by PCR NEGATIVE NEGATIVE Final    Comment:        The GeneXpert MRSA Assay (FDA approved for NASAL specimens only), is one component of a comprehensive MRSA colonization surveillance program. It is not intended to diagnose MRSA infection nor to guide or monitor treatment for MRSA infections.          Radiology Studies: No results found.      Scheduled Meds: . enoxaparin (LOVENOX) injection  40 mg Subcutaneous Q24H  . insulin aspart  0-5 Units Subcutaneous QHS  . insulin aspart  0-9 Units Subcutaneous TID WC  . insulin glargine  25 Units Subcutaneous Q24H  . pneumococcal 23 valent vaccine  0.5 mL Intramuscular Tomorrow-1000  . sodium chloride flush  3 mL Intravenous Q12H   Continuous Infusions: .  dextrose 5 % and 0.45% NaCl 125 mL/hr at 03/30/16 0341     LOS: 1 day    Time spent: 30 minutes. Greater than 50% of this time was spent in direct contact with the patient coordinating care.     Chaya Jan, MD [facility] Pager [phone]  If 7PM-7AM, please contact night-coverage [email or web address] Password [facility] 03/30/2016, 11:48 AM

## 2016-03-30 NOTE — Progress Notes (Signed)
Paged MD on call, Dr. Antionette Char to notifiy of am labs. New orders obtained will continue to monitor.

## 2016-03-30 NOTE — Progress Notes (Signed)
Pt presented in DKA. She was on insulin infusion. Gap has closed, serum bicarb 22, CBG's <250. Will transition to sq insulin at this time.

## 2016-03-31 DIAGNOSIS — D72829 Elevated white blood cell count, unspecified: Secondary | ICD-10-CM

## 2016-03-31 DIAGNOSIS — E1010 Type 1 diabetes mellitus with ketoacidosis without coma: Principal | ICD-10-CM

## 2016-03-31 LAB — BASIC METABOLIC PANEL
ANION GAP: 3 — AB (ref 5–15)
BUN: 10 mg/dL (ref 6–20)
CHLORIDE: 109 mmol/L (ref 101–111)
CO2: 24 mmol/L (ref 22–32)
CREATININE: 0.37 mg/dL — AB (ref 0.44–1.00)
Calcium: 7.6 mg/dL — ABNORMAL LOW (ref 8.9–10.3)
GFR calc non Af Amer: >60 mL/min (ref >60)
Glucose, Bld: 173 mg/dL — ABNORMAL HIGH (ref 65–99)
POTASSIUM: 3.5 mmol/L (ref 3.5–5.1)
Sodium: 136 mmol/L (ref 135–145)

## 2016-03-31 LAB — URINE CULTURE

## 2016-03-31 LAB — GLUCOSE, CAPILLARY
GLUCOSE-CAPILLARY: 88 mg/dL (ref 65–99)
Glucose-Capillary: 164 mg/dL — ABNORMAL HIGH (ref 65–99)
Glucose-Capillary: 125 mg/dL — ABNORMAL HIGH (ref 65–99)

## 2016-03-31 LAB — CBC
HEMATOCRIT: 33.9 % — AB (ref 36.0–46.0)
HEMOGLOBIN: 11.9 g/dL — AB (ref 12.0–15.0)
MCH: 29 pg (ref 26.0–34.0)
MCHC: 35.1 g/dL (ref 30.0–36.0)
MCV: 82.5 fL (ref 78.0–100.0)
Platelets: 203 10*3/uL (ref 150–400)
RBC: 4.11 MIL/uL (ref 3.87–5.11)
RDW: 15.1 % (ref 11.5–15.5)
WBC: 10.1 10*3/uL (ref 4.0–10.5)

## 2016-03-31 MED ORDER — INSULIN GLARGINE 100 UNIT/ML ~~LOC~~ SOLN: 25.0000 [IU] | SUBCUTANEOUS | Status: DC

## 2016-03-31 NOTE — Discharge Summary (Signed)
Physician Discharge Summary  [name] WUJ:[id number] DOB: 18-Jul-1986 DOA: 03/29/2016  PCP: Ernestine Conrad, MD  Admit date: 03/29/2016 Discharge date: 03/31/2016  Time spent: 45 minutes  Recommendations for Outpatient Follow-up:  -Will be discharged today.   Discharge Diagnoses:  Principal Problem:   DKA Active Problems:   Diabetes mellitus type 1, uncontrolled (HCC)   Leukocytosis   Discharge Condition: Stable and improved  Filed Weights   03/29/16 1454 03/29/16 1747 03/30/16 0500  Weight: 49.896 kg (110 lb) 46 kg (101 lb 6.6 oz) 48.3 kg (106 lb 7.7 oz)    History of present illness:  As per Dr. Maryfrances Bunnell on 6/13: [name] is a 30 y.o. female with a past medical history significant for T1DM uncontrolled who presents with vomiting and 5 days no insulin.  The patient was in her usual state of health until 5 days ago when she was put in jail for a wart regarding findings. Since then she has not had any insulin. Over the last 3 days she has had worsening thirst and polyuria. Today, she started vomiting, developed generalized pain, malaise and abdominal discomfort, and so was brought to the ER.  ED course: -Afebrile, tachycardic, normotensive, mentating well -Na 129, K 4.9, Cr 1.10 (baseline 0.5), WBC 20.7K, Hgb 15 -HCO3 13, AG 23, blood sugar 543, ketonuria -Insulin drip and 3L NS were administered and TRH were asked to evaluate for admission.  Normally she takes Lantus 40 nightly and a sliding scale short acting with meals (I think she gives this by guesswork, because she tells me that she takes a sliding scale based on her pre-meal BG but also that she gives herself 1 unit per "50 grams" of carbs).  Hospital Course:   Diabetic ketoacidosis -Resolved, has been transitioned off IV insulin. -CBGs remain well controlled. -Continue Lantus at roughly half her home dose as well as sliding scale of insulin.  Hypotension -Resolved overnight with IVF.  Acute  renal failure -Secondary to DKA, resolved.  Leukocytosis -Resolved. -No infectious process identified. -Likely reactive due to DKA.  Procedures:  None   Consultations:  None  Discharge Instructions  Discharge Instructions    Diet - low sodium heart healthy    Complete by:  As directed      Increase activity slowly    Complete by:  As directed             Medication List    STOP taking these medications        traZODone 50 MG tablet  Commonly known as:  DESYREL      TAKE these medications        insulin aspart 100 UNIT/ML injection  Commonly known as:  novoLOG  Inject 1-10 Units into the skin 3 (three) times daily as needed for high blood sugar. Per sliding scale instructions per the patient: 150-250=3  251-350=4 units  351-400+=10 units     insulin glargine 100 UNIT/ML injection  Commonly known as:  LANTUS  Inject 0.25 mLs (25 Units total) into the skin daily.       Allergies  Allergen Reactions  . Penicillins Anaphylaxis    .[name] KitchenHas patient had a PCN reaction causing immediate rash, facial/tongue/throat swelling, SOB or lightheadedness with hypotension: Yes Has patient had a PCN reaction causing severe rash involving mucus membranes or skin necrosis: Yes Has patient had a PCN reaction that required hospitalization Yes Has patient had a PCN reaction occurring within the last 10 years: No If all of the  above answers are "NO", then may proceed with Cephalosporin use.   . Codeine Nausea And Vomiting  . Tramadol Itching       Follow-up Information    Follow up with Ernestine Conrad, MD. Schedule an appointment as soon as possible for a visit in 2 weeks.   Specialty:  Family Medicine   Contact information:   [address] [place] [address] [phone]        The results of significant diagnostics from this hospitalization (including imaging, microbiology, ancillary and laboratory) are listed below for reference.    Significant Diagnostic  Studies: Dg Chest Port 1 View  03/30/2016  CLINICAL DATA:  Left-sided chest pain with shortness of breath and cough for 4 days. History of diabetes. Leukocytosis. EXAM: PORTABLE CHEST 1 VIEW COMPARISON:  10/06/2015. FINDINGS: 1616 hours. The heart size and mediastinal contours are normal. The lungs are clear. There is no pleural effusion or pneumothorax. No acute osseous findings are identified. IMPRESSION: Stable chest.  No active cardiopulmonary process. Electronically Signed   By: Carey Bullocks M.D.   On: 03/30/2016 16:37    Microbiology: Recent Results (from the past 240 hour(s))  Urine culture     Status: None (Preliminary result)   Collection Time: 03/29/16  3:00 PM  Result Value Ref Range Status   Specimen Description URINE, CLEAN CATCH  Final   Special Requests NONE  Final   Culture   Final    CULTURE REINCUBATED FOR BETTER GROWTH Performed at [facility]    Report Status PENDING  Incomplete  MRSA PCR Screening     Status: None   Collection Time: 03/29/16  6:00 PM  Result Value Ref Range Status   MRSA by PCR NEGATIVE NEGATIVE Final    Comment:        The GeneXpert MRSA Assay (FDA approved for NASAL specimens only), is one component of a comprehensive MRSA colonization surveillance program. It is not intended to diagnose MRSA infection nor to guide or monitor treatment for MRSA infections.      Labs: Basic Metabolic Panel:  Recent Labs Lab 03/29/16 1500 03/29/16 1954 03/30/16 0006 03/30/16 0414 03/31/16 0544  NA 129* 135 134* 134* 136  K 4.9 4.4 3.5 3.5 3.5  CL 93* 111 111 110 109  CO2 13* 17* 20* 22 24  GLUCOSE 536* 232* 180* 97 173*  BUN 34* 24* 18 15 10   CREATININE 1.19* 0.72 0.61 0.53 0.37*  CALCIUM 9.6 7.6* 7.5* 7.5* 7.6*  MG 2.0  --   --   --   --   PHOS 6.7*  --   --   --   --    Liver Function Tests:  Recent Labs Lab 03/29/16 1529  AST 24  ALT 18  ALKPHOS 86  BILITOT 1.3*  PROT 8.4*  ALBUMIN 4.5    Recent Labs Lab  03/29/16 1529  LIPASE <10*   No results for input(s): AMMONIA in the last 168 hours. CBC:  Recent Labs Lab 03/29/16 1500 03/30/16 0414 03/31/16 0544  WBC 20.7* 23.5* 10.1  HGB 15.2* 12.0 11.9*  HCT 44.4 34.1* 33.9*  MCV 83.6 83.0 82.5  PLT 288 220 203   Cardiac Enzymes: No results for input(s): CKTOTAL, CKMB, CKMBINDEX, TROPONINI in the last 168 hours. BNP: BNP (last 3 results) No results for input(s): BNP in the last 8760 hours.  ProBNP (last 3 results) No results for input(s): PROBNP in the last 8760 hours.  CBG:  Recent Labs Lab  03/30/16 1640 03/30/16 2052 03/31/16 0547 03/31/16 0729 03/31/16 1110  GLUCAP 133* 143* 164* 125* 88       Signed:  HERNANDEZ ACOSTA,ESTELA  [facility] Pager: [phone] 03/31/2016, 11:28 AM

## 2016-03-31 NOTE — Progress Notes (Signed)
Patient states understanding of discharge instructions,prescription and discharge handout given to officer.

## 2016-03-31 NOTE — Care Management Note (Signed)
Case Management Note  Patient Details  Name: [name] MRN: [id number] Date of Birth: 1986-06-16  Subjective/Objective:                  Pt from [facility]. Admitted with DKA after not taking insulin. Pt discharging back to jail today. RN at [facility] notified via phone call of DC today and faxed DC summary. Rail aware pt needs to receive her insulin.   Action/Plan: No further needs.   Expected Discharge Date:       03/31/2016           Expected Discharge Plan:  [facility]  In-House Referral:  NA  Discharge planning Services  CM Consult  Post Acute Care Choice:  NA Choice offered to:  NA  DME Arranged:    DME Agency:     HH Arranged:    HH Agency:     Status of Service:  Completed, signed off  Medicare Important Message Given:    Date Medicare IM Given:    Medicare IM give by:    Date Additional Medicare IM Given:    Additional Medicare Important Message give by:     If discussed at Long Length of Stay Meetings, dates discussed:    Additional Comments:  Malcolm Metro, RN 03/31/2016, 12:03 PM

## 2016-07-14 ENCOUNTER — Encounter (HOSPITAL_COMMUNITY): Payer: Self-pay | Admitting: *Deleted

## 2016-07-14 ENCOUNTER — Inpatient Hospital Stay (HOSPITAL_COMMUNITY): Payer: Medicaid Other

## 2016-07-14 ENCOUNTER — Inpatient Hospital Stay (HOSPITAL_COMMUNITY)
Admission: EM | Admit: 2016-07-14 | Discharge: 2016-07-15 | DRG: 638 | Disposition: A | Discharge status: Home / Self Care | Payer: Medicaid Other | Attending: Internal Medicine | Admitting: Internal Medicine

## 2016-07-14 DIAGNOSIS — R112 Nausea with vomiting, unspecified: Secondary | ICD-10-CM | POA: Diagnosis not present

## 2016-07-14 DIAGNOSIS — E871 Hypo-osmolality and hyponatremia: Secondary | ICD-10-CM | POA: Diagnosis present

## 2016-07-14 DIAGNOSIS — Z794 Long term (current) use of insulin: Secondary | ICD-10-CM | POA: Diagnosis not present

## 2016-07-14 DIAGNOSIS — E1069 Type 1 diabetes mellitus with other specified complication: Secondary | ICD-10-CM | POA: Diagnosis present

## 2016-07-14 DIAGNOSIS — E1065 Type 1 diabetes mellitus with hyperglycemia: Secondary | ICD-10-CM

## 2016-07-14 DIAGNOSIS — N390 Urinary tract infection, site not specified: Secondary | ICD-10-CM | POA: Diagnosis present

## 2016-07-14 DIAGNOSIS — E1110 Type 2 diabetes mellitus with ketoacidosis without coma: Secondary | ICD-10-CM | POA: Diagnosis present

## 2016-07-14 DIAGNOSIS — E1010 Type 1 diabetes mellitus with ketoacidosis without coma: Secondary | ICD-10-CM | POA: Diagnosis present

## 2016-07-14 DIAGNOSIS — F17210 Nicotine dependence, cigarettes, uncomplicated: Secondary | ICD-10-CM | POA: Diagnosis present

## 2016-07-14 DIAGNOSIS — Z885 Allergy status to narcotic agent status: Secondary | ICD-10-CM | POA: Diagnosis not present

## 2016-07-14 DIAGNOSIS — Z880 Allergy status to penicillin: Secondary | ICD-10-CM

## 2016-07-14 DIAGNOSIS — Z833 Family history of diabetes mellitus: Secondary | ICD-10-CM | POA: Diagnosis not present

## 2016-07-14 DIAGNOSIS — Z888 Allergy status to other drugs, medicaments and biological substances status: Secondary | ICD-10-CM | POA: Diagnosis not present

## 2016-07-14 LAB — CBC WITH DIFFERENTIAL/PLATELET
BASOS ABS: 0 10*3/uL (ref 0.0–0.1)
Basophils Relative: 0 %
EOS PCT: 0 %
Eosinophils Absolute: 0 10*3/uL (ref 0.0–0.7)
HCT: 40.7 % (ref 36.0–46.0)
HEMOGLOBIN: 13.3 g/dL (ref 12.0–15.0)
LYMPHS PCT: 9 %
Lymphs Abs: 0.8 10*3/uL (ref 0.7–4.0)
MCH: 28.7 pg (ref 26.0–34.0)
MCHC: 32.7 g/dL (ref 30.0–36.0)
MCV: 87.7 fL (ref 78.0–100.0)
Monocytes Absolute: 0.3 10*3/uL (ref 0.1–1.0)
Monocytes Relative: 4 %
NEUTROS PCT: 87 %
Neutro Abs: 7.9 10*3/uL — ABNORMAL HIGH (ref 1.7–7.7)
PLATELETS: 249 10*3/uL (ref 150–400)
RBC: 4.64 MIL/uL (ref 3.87–5.11)
RDW: 14.4 % (ref 11.5–15.5)
WBC: 9.1 10*3/uL (ref 4.0–10.5)

## 2016-07-14 LAB — POC URINE PREG, ED: PREG TEST UR: NEGATIVE

## 2016-07-14 LAB — URINALYSIS, ROUTINE W REFLEX MICROSCOPIC
Bilirubin Urine: NEGATIVE
Glucose, UA: >1000 mg/dL — AB
NITRITE: NEGATIVE
PH: 5.5 (ref 5.0–8.0)
PROTEIN: NEGATIVE mg/dL
Specific Gravity, Urine: 1.04 — ABNORMAL HIGH (ref 1.005–1.030)

## 2016-07-14 LAB — I-STAT VENOUS BLOOD GAS, ED
Acid-base deficit: 9 mmol/L — ABNORMAL HIGH (ref 0.0–2.0)
Bicarbonate: 15.8 mmol/L — ABNORMAL LOW (ref 20.0–28.0)
O2 Saturation: 92 %
PH VEN: 7.327 (ref 7.250–7.430)
TCO2: 17 mmol/L (ref 0–100)
pCO2, Ven: 30.2 mmHg — ABNORMAL LOW (ref 44.0–60.0)
pO2, Ven: 68 mmHg — ABNORMAL HIGH (ref 32.0–45.0)

## 2016-07-14 LAB — CBG MONITORING, ED
GLUCOSE-CAPILLARY: 297 mg/dL — AB (ref 65–99)
GLUCOSE-CAPILLARY: 190 mg/dL — AB (ref 65–99)
Glucose-Capillary: 235 mg/dL — ABNORMAL HIGH (ref 65–99)

## 2016-07-14 LAB — I-STAT TROPONIN, ED: Troponin i, poc: 0 ng/mL (ref 0.00–0.08)

## 2016-07-14 LAB — BLOOD GAS, VENOUS
Acid-base deficit: 0.3 mmol/L (ref 0.0–2.0)
Bicarbonate: 25.1 mmol/L (ref 20.0–28.0)
FIO2: 21
O2 Saturation: 80.5 %
PATIENT TEMPERATURE: 98.6
PO2 VEN: 52.2 mmHg — AB (ref 32.0–45.0)
pCO2, Ven: 50.7 mmHg (ref 44.0–60.0)
pH, Ven: 7.314 (ref 7.250–7.430)

## 2016-07-14 LAB — BASIC METABOLIC PANEL
ANION GAP: 17 — AB (ref 5–15)
BUN: 29 mg/dL — ABNORMAL HIGH (ref 6–20)
CHLORIDE: 102 mmol/L (ref 101–111)
CO2: 15 mmol/L — ABNORMAL LOW (ref 22–32)
Calcium: 8.8 mg/dL — ABNORMAL LOW (ref 8.9–10.3)
Creatinine, Ser: 0.97 mg/dL (ref 0.44–1.00)
GFR calc Af Amer: >60 mL/min (ref >60)
GLUCOSE: 301 mg/dL — AB (ref 65–99)
POTASSIUM: 4.2 mmol/L (ref 3.5–5.1)
SODIUM: 134 mmol/L — AB (ref 135–145)

## 2016-07-14 LAB — URINE MICROSCOPIC-ADD ON

## 2016-07-14 LAB — I-STAT CG4 LACTIC ACID, ED: LACTIC ACID, VENOUS: 2.01 mmol/L — AB (ref 0.5–1.9)

## 2016-07-14 MED ORDER — SODIUM CHLORIDE 0.9 % IV SOLN
INTRAVENOUS | Status: DC
  Administered 2016-07-14: 22:00:00 via INTRAVENOUS

## 2016-07-14 MED ORDER — ONDANSETRON HCL 4 MG/2ML IJ SOLN
4.0000 mg | Freq: Once | INTRAMUSCULAR | Status: AC
  Administered 2016-07-14: 4 mg via INTRAVENOUS
  Filled 2016-07-14: qty 2

## 2016-07-14 MED ORDER — SODIUM CHLORIDE 0.9 % IV SOLN
INTRAVENOUS | Status: DC
  Administered 2016-07-14: 1.8 [IU]/h via INTRAVENOUS
  Filled 2016-07-14: qty 2.5

## 2016-07-14 MED ORDER — DEXTROSE-NACL 5-0.45 % IV SOLN
INTRAVENOUS | Status: DC
  Administered 2016-07-14: via INTRAVENOUS

## 2016-07-14 MED ORDER — GI COCKTAIL ~~LOC~~
30.0000 mL | Freq: Once | ORAL | Status: AC
  Administered 2016-07-14: 30 mL via ORAL
  Filled 2016-07-14: qty 30

## 2016-07-14 MED ORDER — SODIUM CHLORIDE 0.9 % IV BOLUS (SEPSIS)
1000.0000 mL | Freq: Once | INTRAVENOUS | Status: AC
  Administered 2016-07-14: 1000 mL via INTRAVENOUS

## 2016-07-14 MED ORDER — LEVOFLOXACIN IN D5W 500 MG/100ML IV SOLN
500.0000 mg | INTRAVENOUS | Status: DC
  Administered 2016-07-14: 500 mg via INTRAVENOUS
  Filled 2016-07-14: qty 100

## 2016-07-14 NOTE — ED Notes (Signed)
Dr. Selena Batten at [facility]

## 2016-07-14 NOTE — ED Provider Notes (Signed)
[facility]-EMERGENCY DEPT Provider Note   CSN: [id number] Arrival date & time: 07/14/16  1928     History   Chief Complaint Chief Complaint  Patient presents with  . Emesis    HPI [name] is a 30 y.o. female.  HPI Patient with nausea and vomiting. Began earlier today. She's been out of her long-acting insulin for the last few days. States that she has had the short acting. No fevers. States she has had some chills. Has chest pain and abdominal pain with it. States she feels better left jaw 2. States she feels the way she has a past sugars got too high.   Past Medical History:  Diagnosis Date  . Diabetes mellitus   . DKA 05/01/2011    Patient Active Problem List   Diagnosis Date Noted  . Panic disorder without agoraphobia with moderate panic attacks 01/20/2016  . Benzodiazepine abuse 01/20/2016  . Substance induced mood disorder (HCC) 01/19/2016  . DKA 05/01/2011  . Diabetes mellitus type 1, uncontrolled (HCC) 05/01/2011  . Leukocytosis 05/01/2011    Past Surgical History:  Procedure Laterality Date  . TUBAL LIGATION    . WOUND EXPLORATION Left 07/26/2014   Procedure: Irrigation, debridement and exploration of elbow wounds;  Surgeon: Betha Loa, MD;  Location: [facility] OR;  Service: Orthopedics;  Laterality: Left;    OB History    Gravida Para Term Preterm AB Living   2 2           SAB TAB Ectopic Multiple Live Births                   Home Medications    Prior to Admission medications   Medication Sig Start Date End Date Taking? Authorizing Provider  insulin aspart (NOVOLOG) 100 UNIT/ML injection Inject 1-10 Units into the skin 3 (three) times daily as needed for high blood sugar. Per sliding scale instructions per the patient: 150-250=3  251-350=4 units  351-400+=10 units    Historical Provider, MD  insulin glargine (LANTUS) 100 UNIT/ML injection Inject 0.25 mLs (25 Units total) into the skin daily. 03/31/16   Estela Isaiah Blakes, MD    Family  History Family History  Problem Relation Age of Onset  . Coronary artery disease Mother   . Lung cancer Mother   . Heart attack Father 58  . Thyroid disease Maternal Grandmother   . Diabetes type I Daughter     Social History Social History  Substance Use Topics  . Smoking status: Current Every Day Smoker    Packs/day: 1.00    Years: 12.00    Types: Cigarettes  . Smokeless tobacco: Never Used     Comment: She is currently trying to quit.  . Alcohol use No     Allergies   Penicillins; Codeine; and Tramadol   Review of Systems Review of Systems  Constitutional: Positive for appetite change and fatigue.  HENT: Negative for congestion.   Eyes: Negative for photophobia.  Respiratory: Positive for shortness of breath.   Cardiovascular: Positive for chest pain.  Gastrointestinal: Positive for abdominal pain, nausea and vomiting.  Genitourinary: Negative for difficulty urinating.  Musculoskeletal: Negative for back pain.  Skin: Negative for wound.  Neurological: Negative for seizures.  Hematological: Negative for adenopathy.     Physical Exam Updated Vital Signs BP 129/65 (BP Location: Right Arm)   Pulse 94   Temp 97.9 F (36.6 C) (Oral)   Resp 25   Ht 5' (1.524 m)   Wt 110 lb (  49.9 kg)   SpO2 100%   BMI 21.48 kg/m   Physical Exam  Constitutional: She appears well-developed.  HENT:  Head: Atraumatic.  Neck: Neck supple.  Cardiovascular: Normal rate.   Pulmonary/Chest: Effort normal.  Abdominal: There is tenderness.  Mild epigastric tenderness without rebound or guarding.  Musculoskeletal: Normal range of motion.  Neurological: She is alert.  Skin: Skin is warm.  Psychiatric: She has a normal mood and affect.     ED Treatments / Results  Labs (all labs ordered are listed, but only abnormal results are displayed) Labs Reviewed  CBC WITH DIFFERENTIAL/PLATELET - Abnormal; Notable for the following:       Result Value   Neutro Abs 7.9 (*)    All other  components within normal limits  BASIC METABOLIC PANEL - Abnormal; Notable for the following:    Sodium 134 (*)    CO2 15 (*)    Glucose, Bld 301 (*)    BUN 29 (*)    Calcium 8.8 (*)    Anion gap 17 (*)    All other components within normal limits  BLOOD GAS, VENOUS - Abnormal; Notable for the following:    pO2, Ven 52.2 (*)    All other components within normal limits  URINALYSIS, ROUTINE W REFLEX MICROSCOPIC (NOT AT [facility]) - Abnormal; Notable for the following:    APPearance HAZY (*)    Specific Gravity, Urine 1.040 (*)    Glucose, UA >1000 (*)    Hgb urine dipstick LARGE (*)    Ketones, ur >80 (*)    Leukocytes, UA SMALL (*)    All other components within normal limits  URINE MICROSCOPIC-ADD ON - Abnormal; Notable for the following:    Squamous Epithelial / LPF 6-30 (*)    Bacteria, UA FEW (*)    All other components within normal limits  I-STAT CG4 LACTIC ACID, ED - Abnormal; Notable for the following:    Lactic Acid, Venous 2.01 (*)    All other components within normal limits  CBG MONITORING, ED - Abnormal; Notable for the following:    Glucose-Capillary 297 (*)    All other components within normal limits  I-STAT VENOUS BLOOD GAS, ED - Abnormal; Notable for the following:    pCO2, Ven 30.2 (*)    pO2, Ven 68.0 (*)    Bicarbonate 15.8 (*)    Acid-base deficit 9.0 (*)    All other components within normal limits  I-STAT TROPOININ, ED  POC URINE PREG, ED    EKG  EKG Interpretation  Date/Time:  Thursday July 14 2016 19:41:27 EDT Ventricular Rate:  94 PR Interval:    QRS Duration: 92 QT Interval:  385 QTC Calculation: 482 R Axis:   82 Text Interpretation:  Sinus rhythm Probable left atrial enlargement RSR' in V1 or V2, probably normal variant Nonspecific T abnrm, anterolateral leads Borderline prolonged QT interval Confirmed by Rubin PayorPICKERING  MD, Harrold DonathNATHAN [phone]([id number]) on 07/14/2016 9:17:17 PM       Radiology No results found.  Procedures Procedures (including  critical care time)  Medications Ordered in ED Medications  sodium chloride 0.9 % bolus 1,000 mL (1,000 mLs Intravenous New Bag/Given 07/14/16 2052)  insulin regular (NOVOLIN R,HUMULIN R) 250 Units in sodium chloride 0.9 % 250 mL (1 Units/mL) infusion (not administered)  dextrose 5 %-0.45 % sodium chloride infusion (not administered)  sodium chloride 0.9 % bolus 1,000 mL (not administered)    And  0.9 %  sodium chloride infusion (not administered)  Initial Impression / Assessment and Plan / ED Course  I have reviewed the triage vital signs and the nursing notes.  Pertinent labs & imaging results that were available during my care of the patient were reviewed by me and considered in my medical decision making (see chart for details).  Clinical Course    Patient presents with nausea vomiting. Found to be in DKA. Bicarbonate of 17. PH of 7.31. Tachycardia is improved somewhat. Greater than 80 ketones in the urine. Admit to stepdown bed.  Final Clinical Impressions(s) / ED Diagnoses   Final diagnoses:  Type 1 diabetes mellitus with ketoacidosis without coma [facility])    New Prescriptions New Prescriptions   No medications on file     Benjiman Core, MD 07/14/16 2117

## 2016-07-14 NOTE — ED Notes (Signed)
Dr. Rubin Payor in to room

## 2016-07-14 NOTE — ED Triage Notes (Signed)
H/o DM, out of long acting insulin, has been using up her short acting insulin, here by EMS for nv, tachypnea and CP. Given NS 500cc IVF and zofran PTA. CBG PTA 273. Reported as orthostatic. HR improved from 122 down to 100. Onset today.

## 2016-07-14 NOTE — H&P (Addendum)
TRH H&P   Patient Demographics:    [name], is a 30 y.o. female  MRN: [id number]   DOB - [birth year]  Admit Date - 07/14/2016  Outpatient Primary MD for the patient is Ernestine Conrad, MD  Referring MD/NP/PA: Benjiman Core  Outpatient Specialists:    Patient coming from: home  Chief Complaint  Patient presents with  . Emesis      HPI:    [name]  is a 31 y.o. female, w Dm type 1, apparently has had dysuria x 2 days as well as nausea and vomitting.  Pt denies fever, chills, flank pain.  Pt notes that her sugar was high due to running out of medication x 4 days. Due to loosing her medicaid.   In ED, pt noted to have uti as well as DKA.  Pt will be admitted for the above.     Review of systems:    In addition to the HPI above,  No Fever-chills, No Headache, No changes with Vision or hearing, No problems swallowing food or Liquids, No Chest pain, Cough or Shortness of Breath, No Bowel movements are regular, No Blood in stool or Urine, No new skin rashes or bruises, No new joints pains-aches,  No new weakness, tingling, numbness in any extremity, No recent weight gain or loss, No polyuria, polydypsia or polyphagia, No significant Mental Stressors.  A full 10 point Review of Systems was done, except as stated above, all other Review of Systems were negative.   With Past History of the following :    Past Medical History:  Diagnosis Date  . Diabetes mellitus   . DKA 05/01/2011      Past Surgical History:  Procedure Laterality Date  . TUBAL LIGATION    . WOUND EXPLORATION Left 07/26/2014   Procedure: Irrigation, debridement and exploration of elbow wounds;  Surgeon: Betha Loa, MD;  Location: [facility] OR;  Service: Orthopedics;  Laterality: Left;      Social History:     Social History  Substance Use Topics  . Smoking status: Current Every  Day Smoker    Packs/day: 1.00    Years: 15.00    Types: Cigarettes  . Smokeless tobacco: Never Used     Comment: She is currently trying to quit.  . Alcohol use No     Lives - at home  Mobility -   Ambulates by self   Family History :     Family History  Problem Relation Age of Onset  . Coronary artery disease Mother   . Lung cancer Mother   . Heart attack Father 65  . Thyroid disease Maternal Grandmother   . Diabetes type I Daughter       Home Medications:   Prior to Admission medications   Medication Sig Start Date End Date Taking? Authorizing Provider  insulin aspart (NOVOLOG) 100 UNIT/ML injection Inject 1-10 Units  into the skin 3 (three) times daily as needed for high blood sugar. Per sliding scale instructions per the patient: 150-250=3  251-350=4 units  351-400+=10 units   Yes Historical Provider, MD  insulin glargine (LANTUS) 100 UNIT/ML injection Inject 0.25 mLs (25 Units total) into the skin daily. Patient taking differently: Inject 35 Units into the skin at bedtime.  03/31/16  Yes Estela Isaiah Blakes, MD     Allergies:     Allergies  Allergen Reactions  . Penicillins Anaphylaxis    .[name] KitchenHas patient had a PCN reaction causing immediate rash, facial/tongue/throat swelling, SOB or lightheadedness with hypotension: Yes Has patient had a PCN reaction causing severe rash involving mucus membranes or skin necrosis: Yes Has patient had a PCN reaction that required hospitalization Yes Has patient had a PCN reaction occurring within the last 10 years: No If all of the above answers are "NO", then may proceed with Cephalosporin use.   . Codeine Nausea And Vomiting  . Tramadol Itching     Physical Exam:   Vitals  Blood pressure 129/65, pulse 94, temperature 97.9 F (36.6 C), temperature source Oral, resp. rate 25, height 5' (1.524 m), weight 49.9 kg (110 lb), SpO2 100 %.   1. General  lying in bed in NAD,    2. Normal affect and insight, Not Suicidal  or Homicidal, Awake Alert, Oriented X 3.  3. No F.N deficits, ALL C.Nerves Intact, Strength 5/5 all 4 extremities, Sensation intact all 4 extremities, Plantars down going.  4. Ears and Eyes appear Normal, Conjunctivae clear, PERRLA. Moist Oral Mucosa.  5. Supple Neck, No JVD, No cervical lymphadenopathy appriciated, No Carotid Bruits.  6. Symmetrical Chest wall movement, Good air movement bilaterally, CTAB.  7. RRR, No Gallops, Rubs or Murmurs, No Parasternal Heave.  8. Positive Bowel Sounds, Abdomen Soft, No tenderness, No organomegaly appriciated,No rebound -guarding or rigidity.  9.  No Cyanosis, Normal Skin Turgor, No Skin Rash or Bruise.  10. Good muscle tone,  joints appear normal , no effusions, Normal ROM.  11. No Palpable Lymph Nodes in Neck or Axillae  No flank pain   Data Review:    CBC  Recent Labs Lab 07/14/16 1955  WBC 9.1  HGB 13.3  HCT 40.7  PLT 249  MCV 87.7  MCH 28.7  MCHC 32.7  RDW 14.4  LYMPHSABS 0.8  MONOABS 0.3  EOSABS 0.0  BASOSABS 0.0   ------------------------------------------------------------------------------------------------------------------  Chemistries   Recent Labs Lab 07/14/16 1955  NA 134*  K 4.2  CL 102  CO2 15*  GLUCOSE 301*  BUN 29*  CREATININE 0.97  CALCIUM 8.8*   ------------------------------------------------------------------------------------------------------------------ estimated creatinine clearance is 59.8 mL/min (by C-G formula based on SCr of 0.97 mg/dL). ------------------------------------------------------------------------------------------------------------------ No results for input(s): TSH, T4TOTAL, T3FREE, THYROIDAB in the last 72 hours.  Invalid input(s): FREET3  Coagulation profile No results for input(s): INR, PROTIME in the last 168 hours. ------------------------------------------------------------------------------------------------------------------- No results for input(s): DDIMER  in the last 72 hours. -------------------------------------------------------------------------------------------------------------------  Cardiac Enzymes No results for input(s): CKMB, TROPONINI, MYOGLOBIN in the last 168 hours.  Invalid input(s): CK ------------------------------------------------------------------------------------------------------------------ No results found for: BNP   ---------------------------------------------------------------------------------------------------------------  Urinalysis    Component Value Date/Time   COLORURINE YELLOW 07/14/2016 2005   APPEARANCEUR HAZY (A) 07/14/2016 2005   LABSPEC 1.040 (H) 07/14/2016 2005   PHURINE 5.5 07/14/2016 2005   GLUCOSEU >1000 (A) 07/14/2016 2005   HGBUR LARGE (A) 07/14/2016 2005   BILIRUBINUR NEGATIVE 07/14/2016 2005   KETONESUR >80 (A) 07/14/2016  2005   PROTEINUR NEGATIVE 07/14/2016 2005   UROBILINOGEN 1.0 07/28/2015 1402   NITRITE NEGATIVE 07/14/2016 2005   LEUKOCYTESUR SMALL (A) 07/14/2016 2005    ----------------------------------------------------------------------------------------------------------------   Imaging Results:    No results found.    Assessment & Plan:    Principal Problem:   DKA Active Problems:   Diabetes mellitus type 1, uncontrolled (HCC)   UTI (lower urinary tract infection)    1. DKA Ns iv at 200 mL per hour NPO except for medications and ice chips Insulin IV  2. Nausea and vomitting zofran  3. Uti levaquin pharmacy to dose.   4. Hyponatremia (pseudo) Mild Hydrate with ns iv  5. Tobacco use Nicotine patch declined  DVT Prophylaxis  Lovenox - SCDs   AM Labs Ordered, also please review Full Orders  Family Communication: Admission, patients condition and plan of care including tests being ordered have been discussed with the patient  who indicate understanding and agree with the plan and Code Status.  Code Status FULL CODE  Likely DC to   home  Condition Critical  Consults called:   Admission status: inpatient  Time spent in minutes : 45 minutes critical care time   Pearson GrippeJames Lithzy Bernard M.D on 07/14/2016 at 10:28 PM  Between 7am to 7pm - Pager - [phone]. After 7pm go to [email or web address] - password [email or web address]  [facility] - Office  [phone]

## 2016-07-14 NOTE — ED Notes (Signed)
H/o DM, out of long acting insulin, has been using up her short acting insulin, here by EMS for nv, CP, tachypneic, weakness, dizziness, cbg 273, HR 122, down to 100 after NS IVF 500cc by EMS, also given zofran 4mg  IV PTA, 20g NSL L AC. Alert, NAD, calm, interactive, tachypneic, no dyspnea, skin W&D.

## 2016-07-14 NOTE — ED Notes (Signed)
Up to b/r, steady gait, urine and blood sent to lab, pending results.

## 2016-07-14 NOTE — Progress Notes (Addendum)
Pharmacy Antibiotic Note  [name] is a 30 y.o. female admitted on 07/14/2016 with UTI (and DKA).  Pharmacy has been consulted for Levaquin dosing. QTc 482. Afebrile, WBC within normal limits, and lactate elevated at 2.01.   SCr in June 0.37-0.6, now with SCr 0.97.   Plan: Levaquin 500 mg q24 hr Monitor renal function, clinical picture, and culture results Follow-up antibiotic duration of therapy   Height: 5' (152.4 cm) Weight: 110 lb (49.9 kg) IBW/kg (Calculated) : 45.5  Temp (24hrs), Avg:97.9 F (36.6 C), Min:97.9 F (36.6 C), Max:97.9 F (36.6 C)   Recent Labs Lab 07/14/16 1955 07/14/16 2013  WBC 9.1  --   CREATININE 0.97  --   LATICACIDVEN  --  2.01*    Estimated Creatinine Clearance: 59.8 mL/min (by C-G formula based on SCr of 0.97 mg/dL).    Allergies  Allergen Reactions  . Penicillins Anaphylaxis    .[name] KitchenHas patient had a PCN reaction causing immediate rash, facial/tongue/throat swelling, SOB or lightheadedness with hypotension: Yes Has patient had a PCN reaction causing severe rash involving mucus membranes or skin necrosis: Yes Has patient had a PCN reaction that required hospitalization Yes Has patient had a PCN reaction occurring within the last 10 years: No If all of the above answers are "NO", then may proceed with Cephalosporin use.   . Codeine Nausea And Vomiting  . Tramadol Itching    Antimicrobials this admission: 9/28 Levaquin >>   Dose adjustments this admission: n/a  Microbiology results: pending   Thank you for allowing pharmacy to be a part of this patient's care.  York Cerise, PharmD Pharmacy Resident  Pager [phone] 07/14/16 10:48 PM

## 2016-07-15 DIAGNOSIS — E1011 Type 1 diabetes mellitus with ketoacidosis with coma: Secondary | ICD-10-CM

## 2016-07-15 DIAGNOSIS — R112 Nausea with vomiting, unspecified: Secondary | ICD-10-CM

## 2016-07-15 DIAGNOSIS — N390 Urinary tract infection, site not specified: Secondary | ICD-10-CM

## 2016-07-15 DIAGNOSIS — E1010 Type 1 diabetes mellitus with ketoacidosis without coma: Principal | ICD-10-CM

## 2016-07-15 LAB — BASIC METABOLIC PANEL
Anion gap: 6 (ref 5–15)
Anion gap: 3 — ABNORMAL LOW (ref 5–15)
Anion gap: 2 — ABNORMAL LOW (ref 5–15)
Anion gap: 4 — ABNORMAL LOW (ref 5–15)
Anion gap: 4 — ABNORMAL LOW (ref 5–15)
BUN: 11 mg/dL (ref 6–20)
BUN: 11 mg/dL (ref 6–20)
BUN: 12 mg/dL (ref 6–20)
BUN: 15 mg/dL (ref 6–20)
BUN: 22 mg/dL — AB (ref 6–20)
CALCIUM: 7.5 mg/dL — AB (ref 8.9–10.3)
CHLORIDE: 111 mmol/L (ref 101–111)
CHLORIDE: 111 mmol/L (ref 101–111)
CHLORIDE: 107 mmol/L (ref 101–111)
CO2: 19 mmol/L — AB (ref 22–32)
CO2: 21 mmol/L — AB (ref 22–32)
CO2: 21 mmol/L — AB (ref 22–32)
CO2: 22 mmol/L (ref 22–32)
CO2: 25 mmol/L (ref 22–32)
CREATININE: 0.58 mg/dL (ref 0.44–1.00)
CREATININE: 0.58 mg/dL (ref 0.44–1.00)
CREATININE: 0.59 mg/dL (ref 0.44–1.00)
CREATININE: 0.6 mg/dL (ref 0.44–1.00)
CREATININE: 0.69 mg/dL (ref 0.44–1.00)
Calcium: 7.5 mg/dL — ABNORMAL LOW (ref 8.9–10.3)
Calcium: 7.7 mg/dL — ABNORMAL LOW (ref 8.9–10.3)
Calcium: 7.8 mg/dL — ABNORMAL LOW (ref 8.9–10.3)
Calcium: 7.8 mg/dL — ABNORMAL LOW (ref 8.9–10.3)
Chloride: 109 mmol/L (ref 101–111)
Chloride: 112 mmol/L — ABNORMAL HIGH (ref 101–111)
GFR calc Af Amer: >60 mL/min (ref >60)
GFR calc Af Amer: >60 mL/min (ref >60)
GFR calc Af Amer: >60 mL/min (ref >60)
GFR calc Af Amer: >60 mL/min (ref >60)
GFR calc Af Amer: >60 mL/min (ref >60)
GFR calc non Af Amer: >60 mL/min (ref >60)
GFR calc non Af Amer: >60 mL/min (ref >60)
GFR calc non Af Amer: >60 mL/min (ref >60)
GFR calc non Af Amer: >60 mL/min (ref >60)
GLUCOSE: 121 mg/dL — AB (ref 65–99)
GLUCOSE: 284 mg/dL — AB (ref 65–99)
Glucose, Bld: 202 mg/dL — ABNORMAL HIGH (ref 65–99)
Glucose, Bld: 112 mg/dL — ABNORMAL HIGH (ref 65–99)
Glucose, Bld: 171 mg/dL — ABNORMAL HIGH (ref 65–99)
POTASSIUM: 4.2 mmol/L (ref 3.5–5.1)
POTASSIUM: 4.2 mmol/L (ref 3.5–5.1)
POTASSIUM: 3.8 mmol/L (ref 3.5–5.1)
Potassium: 4.2 mmol/L (ref 3.5–5.1)
Potassium: 4.2 mmol/L (ref 3.5–5.1)
SODIUM: 135 mmol/L (ref 135–145)
SODIUM: 136 mmol/L (ref 135–145)
Sodium: 134 mmol/L — ABNORMAL LOW (ref 135–145)
Sodium: 136 mmol/L (ref 135–145)
Sodium: 136 mmol/L (ref 135–145)

## 2016-07-15 LAB — GLUCOSE, CAPILLARY
GLUCOSE-CAPILLARY: 198 mg/dL — AB (ref 65–99)
GLUCOSE-CAPILLARY: 142 mg/dL — AB (ref 65–99)
GLUCOSE-CAPILLARY: 129 mg/dL — AB (ref 65–99)
GLUCOSE-CAPILLARY: 112 mg/dL — AB (ref 65–99)
GLUCOSE-CAPILLARY: 160 mg/dL — AB (ref 65–99)
GLUCOSE-CAPILLARY: 241 mg/dL — AB (ref 65–99)
Glucose-Capillary: 118 mg/dL — ABNORMAL HIGH (ref 65–99)
Glucose-Capillary: 119 mg/dL — ABNORMAL HIGH (ref 65–99)
Glucose-Capillary: 173 mg/dL — ABNORMAL HIGH (ref 65–99)

## 2016-07-15 LAB — MRSA PCR SCREENING: MRSA BY PCR: NEGATIVE

## 2016-07-15 LAB — LACTIC ACID, PLASMA: Lactic Acid, Venous: 0.7 mmol/L (ref 0.5–1.9)

## 2016-07-15 MED ORDER — ENOXAPARIN SODIUM 40 MG/0.4ML ~~LOC~~ SOLN
40.0000 mg | Freq: Every day | SUBCUTANEOUS | Status: DC
  Administered 2016-07-15: 40 mg via SUBCUTANEOUS
  Filled 2016-07-15: qty 0.4

## 2016-07-15 MED ORDER — SODIUM CHLORIDE 0.9 % IV SOLN: INTRAVENOUS | Status: DC

## 2016-07-15 MED ORDER — "INSULIN SYRINGE 27G X 1/2"" 0.5 ML MISC": 1.0000 | Freq: Three times a day (TID) | 0 refills | Status: DC

## 2016-07-15 MED ORDER — INSULIN GLARGINE 100 UNIT/ML SOLOSTAR PEN: 35.0000 [IU] | PEN_INJECTOR | Freq: Every day | SUBCUTANEOUS | 11 refills | Status: DC

## 2016-07-15 MED ORDER — INSULIN ASPART 100 UNIT/ML ~~LOC~~ SOLN: 0.0000 [IU] | Freq: Every day | SUBCUTANEOUS | Status: DC

## 2016-07-15 MED ORDER — INSULIN ASPART 100 UNIT/ML ~~LOC~~ SOLN
0.0000 [IU] | Freq: Three times a day (TID) | SUBCUTANEOUS | Status: DC
Start: 2016-07-15 — End: 2016-07-15
  Administered 2016-07-15: 3 [IU] via SUBCUTANEOUS
  Administered 2016-07-15: 2 [IU] via SUBCUTANEOUS

## 2016-07-15 MED ORDER — INSULIN REGULAR HUMAN 100 UNIT/ML IJ SOLN: INTRAMUSCULAR | Status: DC

## 2016-07-15 MED ORDER — LEVOFLOXACIN 250 MG PO TABS
250.0000 mg | ORAL_TABLET | Freq: Every day | ORAL | Status: DC
  Administered 2016-07-15: 250 mg via ORAL
  Filled 2016-07-15: qty 1

## 2016-07-15 MED ORDER — LIVING WELL WITH DIABETES BOOK
Freq: Once | Status: AC
  Administered 2016-07-15: 10:00:00
  Filled 2016-07-15: qty 1

## 2016-07-15 MED ORDER — POTASSIUM CHLORIDE 10 MEQ/100ML IV SOLN
10.0000 meq | INTRAVENOUS | Status: AC
  Administered 2016-07-15 (×2): 10 meq via INTRAVENOUS
  Filled 2016-07-15 (×2): qty 100

## 2016-07-15 MED ORDER — SODIUM CHLORIDE 0.9 % IV SOLN
INTRAVENOUS | Status: DC
  Administered 2016-07-15: 09:00:00 via INTRAVENOUS

## 2016-07-15 MED ORDER — DEXTROSE-NACL 5-0.45 % IV SOLN: INTRAVENOUS | Status: DC

## 2016-07-15 MED ORDER — CIPROFLOXACIN HCL 250 MG PO TABS: 250.0000 mg | ORAL_TABLET | Freq: Two times a day (BID) | ORAL | 0 refills | Status: DC

## 2016-07-15 MED ORDER — INSULIN LISPRO 100 UNIT/ML ~~LOC~~ SOLN: SUBCUTANEOUS | 11 refills | Status: DC

## 2016-07-15 MED ORDER — INSULIN GLARGINE 100 UNIT/ML ~~LOC~~ SOLN
35.0000 [IU] | Freq: Every day | SUBCUTANEOUS | Status: DC
  Administered 2016-07-15: 35 [IU] via SUBCUTANEOUS
  Filled 2016-07-15: qty 0.35

## 2016-07-15 NOTE — Discharge Summary (Signed)
DISCHARGE SUMMARY  [name]  MR#: [id number]  DOB:1986-05-04  Date of Admission: 07/14/2016 Date of Discharge: 07/15/2016  Attending Physician:MCCLUNG,JEFFREY T  Patient's WUJ:[id number]:BLUTH, Lyda PeroneKIRK, MD  Consults: None  Disposition: D/C home   Follow-up Appts: Follow-up Information    Ernestine ConradBLUTH, KIRK, MD Follow up in 1 week(s).   Specialty:  Family Medicine Contact information: [address] [place] [address] [phone]           Tests Needing Follow-up: Monitoring or CBG control   Discharge Diagnoses: Uncontrolled DM1 DKA in DM1  Uncomplicated UTI  Initial presentation: 30 y.o. female w/ DM1 who had dysuria x 2 days as well as nausea and vomitting.  Pt denied fever, chills, or flank pain.  Pt noted that her sugar was high due to running out of medication 4 days prior after she lost her medicaid.   In the ED the pt was noted to have a UTI as well as DKA.   Hospital Course: The patient was admitted to the acute units with a diagnosis of DKA as well as uncomplicated UTI.  She rapidly corrected with IV insulin.  It was clear the cause for her DKA was simply the lack of insulin in the setting of diabetes type 1.  This was found to be due to the inability to afford her insulin.  Case management was able to address this and provided the patient w/ a 30 day supply of her insulin at the time of discharge.  She was transitioned to her usual insulin dosing and tolerated this without any difficulty.  At the time of discharge her CBGs are well-controlled and she was tolerating oral intake without difficulty.  Her UTI responded quickly to a short course of antibiotics which will be completed on the day following her discharge.    Medication List    STOP taking these medications   insulin aspart 100 UNIT/ML injection Commonly known as:  novoLOG   insulin glargine 100 UNIT/ML injection Commonly known as:  LANTUS Replaced by:  Insulin Glargine 100 UNIT/ML Solostar Pen     TAKE these medications   ciprofloxacin 250 MG tablet Commonly known as:  CIPRO Take 1 tablet (250 mg total) by mouth 2 (two) times daily. Start taking on:  07/16/2016   Insulin Glargine 100 UNIT/ML Solostar Pen Commonly known as:  LANTUS SOLOSTAR Inject 35 Units into the skin daily at 10 pm. Replaces:  insulin glargine 100 UNIT/ML injection   insulin lispro 100 UNIT/ML injection Commonly known as:  HUMALOG Inject 2 units for every 15 grams of carbohydrate consumed with meals TID   Insulin Syringe 27G X 1/2" 0.5 ML Misc 1 Syringe by Does not apply route 3 (three) times daily with meals.       Day of Discharge BP (!) 102/58   Pulse 68   Temp 98.7 F (37.1 C) (Oral)   Resp 14   Ht 5' (1.524 m)   Wt 45.6 kg (100 lb 8 oz)   LMP 06/30/2016   SpO2 97%   BMI 19.63 kg/m   Physical Exam: General: No acute respiratory distress Lungs: Clear to auscultation bilaterally without wheezes or crackles Cardiovascular: Regular rate and rhythm without murmur gallop or rub normal S1 and S2 Abdomen: Nontender, nondistended, soft, bowel sounds positive, no rebound, no ascites, no appreciable mass Extremities: No significant cyanosis, clubbing, or edema bilateral lower extremities  Basic Metabolic Panel:  Recent Labs Lab 07/14/16 1955 07/15/16 0049 07/15/16 0435 07/15/16 0712 07/15/16 1158  NA  134* 134* 136 135 136  K 4.2 4.2 4.2 4.2 4.2  CL 102 109 112* 111 111  CO2 15* 19* 21* 22 21*  GLUCOSE 301* 202* 121* 112* 171*  BUN 29* 22* 15 11 12   CREATININE 0.97 0.69 0.58 0.59 0.60  CALCIUM 8.8* 7.5* 7.5* 7.7* 7.8*    CBC:  Recent Labs Lab 07/14/16 1955  WBC 9.1  NEUTROABS 7.9*  HGB 13.3  HCT 40.7  MCV 87.7  PLT 249   CBG:  Recent Labs Lab 07/15/16 0512 07/15/16 0628 07/15/16 0825 07/15/16 1226 07/15/16 1619  GLUCAP 112* 118* 119* 160* 241*    Recent Results (from the past 240 hour(s))  MRSA PCR Screening     Status: None   Collection Time: 07/15/16 12:32 AM   Result Value Ref Range Status   MRSA by PCR NEGATIVE NEGATIVE Final    Comment:        The GeneXpert MRSA Assay (FDA approved for NASAL specimens only), is one component of a comprehensive MRSA colonization surveillance program. It is not intended to diagnose MRSA infection nor to guide or monitor treatment for MRSA infections.      Time spent in discharge (includes decision making & examination of pt): <30 minutes  07/15/2016, 5:08 PM   Lonia Blood, MD [facility] Office  [phone] Pager [phone]  On-Call/Text Page:      [email or web address]      password [facility]

## 2016-07-15 NOTE — Progress Notes (Signed)
ANTIBIOTIC CONSULT NOTE - f/u  Pharmacy Consult for Levaquin Indication: UTI  Allergies  Allergen Reactions  . Penicillins Anaphylaxis    .[name] KitchenHas patient had a PCN reaction causing immediate rash, facial/tongue/throat swelling, SOB or lightheadedness with hypotension: Yes Has patient had a PCN reaction causing severe rash involving mucus membranes or skin necrosis: Yes Has patient had a PCN reaction that required hospitalization Yes Has patient had a PCN reaction occurring within the last 10 years: No If all of the above answers are "NO", then may proceed with Cephalosporin use.   . Codeine Nausea And Vomiting  . Tramadol Itching    Patient Measurements: Height: 5' (152.4 cm) Weight: 100 lb 8 oz (45.6 kg) IBW/kg (Calculated) : 45.5 Adjusted Body Weight:    Vital Signs: Temp: 98.1 F (36.7 C) (09/29 0808) Temp Source: Oral (09/29 0808) BP: 95/58 (09/29 0808) Pulse Rate: 86 (09/29 0808) Intake/Output from previous day: 09/28 0701 - 09/29 0700 In: 2137.5 [I.V.:1837.5; IV Piggyback:300] Out: -  Intake/Output from this shift: Total I/O In: 1197.5 [P.O.:240; I.V.:957.5] Out: -   Labs:  Recent Labs  07/14/16 1955 07/15/16 0049 07/15/16 0435 07/15/16 0712  WBC 9.1  --   --   --   HGB 13.3  --   --   --   PLT 249  --   --   --   CREATININE 0.97 0.69 0.58 0.59   Estimated Creatinine Clearance: 72.5 mL/min (by C-G formula based on SCr of 0.59 mg/dL). No results for input(s): VANCOTROUGH, VANCOPEAK, VANCORANDOM, GENTTROUGH, GENTPEAK, GENTRANDOM, TOBRATROUGH, TOBRAPEAK, TOBRARND, AMIKACINPEAK, AMIKACINTROU, AMIKACIN in the last 72 hours.   Microbiology:   Medical History: Past Medical History:  Diagnosis Date  . Diabetes mellitus   . DKA 05/01/2011   Assessment: 30 y.o. female admitted on 07/14/2016 with UTI (and DKA).  ID: UTI (and DKA). QTc 482. Afebrile, WBC within normal limits, and lactate elevated at 2.01. No urine culture noted. SCr 0.59  Goal of Therapy:   Eradication of infection   Plan:  Levaquin IV q24 hrs dose ok for CrCl>50 Consider changing to PO as soon as possible Pharmacy will sign off. Please reconsult for further dosing assitance.   Danaye Sobh S. Merilynn Finland, PharmD, BCPS Clinical Staff Pharmacist Pager [phone]  Misty Stanley Stillinger 07/15/2016,11:05 AM

## 2016-07-15 NOTE — Discharge Instructions (Signed)
Diabetic Ketoacidosis °Diabetic ketoacidosis is a life-threatening complication of diabetes. If it is not treated, it can cause severe dehydration and organ damage and can lead to a coma or death. °CAUSES °This condition develops when there is not enough of the hormone insulin in the body. Insulin helps the body to break down sugar for energy. Without insulin, the body cannot break down sugar, so it breaks down fats instead. This leads to the production of acids that are called ketones. Ketones are poisonous at high levels. °This condition can be triggered by: °· Stress on the body that is brought on by an illness. °· Medicines that raise blood glucose levels. °· Not taking diabetes medicine. °SYMPTOMS °Symptoms of this condition include: °· Fatigue. °· Weight loss. °· Excessive thirst. °· Light-headedness. °· Fruity or sweet-smelling breath. °· Excessive urination. °· Vision changes. °· Confusion or irritability. °· Nausea. °· Vomiting. °· Rapid breathing. °· Abdominal pain. °· Feeling flushed. °DIAGNOSIS °This condition is diagnosed based on a medical history, a physical exam, and blood tests. You may also have a urine test that checks for ketones. °TREATMENT °This condition may be treated with: °· Fluid replacement. This may be done to correct dehydration. °· Insulin injections. These may be given through the skin or through an IV tube. °· Electrolyte replacement. Electrolytes, such as potassium and sodium, may be given in pill form or through an IV tube. °· Antibiotic medicines. These may be prescribed if your condition was caused by an infection. °HOME CARE INSTRUCTIONS °Eating and Drinking °· Drink enough fluids to keep your urine clear or pale yellow. °· If you cannot eat, alternate between drinking fluids with sugar (such as juice) and salty fluids (such as broth or bouillon). °· If you can eat, follow your usual diet and drink sugar-free liquids, such as water. °Other Instructions °· Take insulin as  directed by your health care provider. Do not skip insulin injections. Do not use expired insulin. °· If your blood sugar is over 240 mg/dL, monitor your urine ketones every 4-6 hours. °· If you were prescribed an antibiotic medicine, finish all of it even if you start to feel better. °· Rest and exercise only as directed by your health care provider. °· If you get sick, call your health care provider and begin treatment quickly. Your body often needs extra insulin to fight an illness. °· Check your blood glucose levels regularly. If your blood glucose is high, drink plenty of fluids. This helps to flush out ketones. °SEEK MEDICAL CARE IF: °· Your blood glucose level is too high or too low. °· You have ketones in your urine. °· You have a fever. °· You cannot eat. °· You cannot tolerate fluids. °· You have been vomiting for more than 2 hours. °· You continue to have symptoms of this condition. °· You develop new symptoms. °SEEK IMMEDIATE MEDICAL CARE IF: °· Your blood glucose levels continue to be high (elevated). °· Your monitor reads "high" even when you are taking insulin. °· You faint. °· You have chest pain. °· You have trouble breathing. °· You have a sudden, severe headache. °· You have sudden weakness in one arm or one leg. °· You have sudden trouble speaking or swallowing. °· You have vomiting or diarrhea that gets worse after 3 hours. °· You feel severely fatigued. °· You have trouble thinking. °· You have abdominal pain. °· You are severely dehydrated. Symptoms of severe dehydration include: °¨ Extreme thirst. °¨ Dry mouth. °¨ Blue lips. °¨   Cold hands and feet. °¨ Rapid breathing. °  °This information is not intended to replace advice given to you by your health care provider. Make sure you discuss any questions you have with your health care provider. °  °Document Released: 09/30/2000 Document Revised: 02/17/2015 Document Reviewed: 09/10/2014 °Elsevier Interactive Patient Education ©2016 Elsevier  Inc. ° °

## 2016-07-15 NOTE — Care Management Note (Addendum)
Case Management Note  Patient Details  Name: [name] MRN: [id number] Date of Birth: 04/23/1986  Subjective/Objective:   Pt states she changed her living situation, she and husband were formerly separated and now are living together and she no longer has medicaid coverage.  Financial counselor will determine whether her medicaid coverage can be reinstated or if she will need another application.  Pt has both free voucher for 30 day supply of Lantus SoloStar and free voucher for Humalog provided by Diabetes Coordinator.  Pt's PCP is Dr Ernestine Conrad.                  Expected Discharge Plan:  Home/Self Care  Discharge planning Services  CM Consult, Medication Assistance  Status of Service:  Completed, signed off  Magdalene [place], [place] 07/15/2016, 3:21 PM

## 2016-07-15 NOTE — Progress Notes (Signed)
Pt discharged to home, verbalized understanding of discharge instructions, prescription discount cards, and prescriptions. VSS. All belongings returned to patient.

## 2016-07-15 NOTE — Progress Notes (Signed)
Inpatient Diabetes Program Recommendations  AACE/ADA: New Consensus Statement on Inpatient Glycemic Control (2015)  Target Ranges:  Prepandial:   less than 140 mg/dL      Peak postprandial:   less than 180 mg/dL (1-2 hours)      Critically ill patients:  140 - 180 mg/dL   Lab Results  Component Value Date   GLUCAP 119 (H) 07/15/2016   HGBA1C 10.9 (H) 03/29/2016    Review of Glycemic Control  Diabetes history: DM1 Outpatient Diabetes medications: Lantus 35 units + Novolog meal coverage 2 units/15 gm carbohydrate tid Current orders for Inpatient glycemic control: Lantus 35 units + Novolog correction 0-9 units tid+0-5 units hs  Inpatient Diabetes Program Recommendations:  Spoke with patient to clarify medication regimen and review medications. -Add meal coverage Novolog 2 units for every 15 gm carbohydrate tid  Provided card for free month supply of Lantus and copay card for Humalog to case manager if needed.  Thank you, Billy Fischer. Panayiota Larkin, RN, MSN, CDE Inpatient Glycemic Control Team Team Pager [phone] (8am-5pm) 07/15/2016 11:44 AM

## 2016-07-15 NOTE — ED Notes (Signed)
Alert, NAD, calm, "feeling better", denies nausea, reports "belly pain 6/10", "just sore from vomiting". VSS.

## 2016-08-13 ENCOUNTER — Ambulatory Visit (HOSPITAL_COMMUNITY)
Admission: RE | Admit: 2016-08-13 | Discharge: 2016-08-13 | Disposition: A | Discharge status: Home / Self Care | Payer: Self-pay | Attending: Psychiatry | Admitting: Psychiatry

## 2016-08-13 NOTE — H&P (Signed)
Behavioral Health Medical Screening Exam  [name] is an 30 y.o. female.  Total Time spent with patient: 20 minutes  Psychiatric Specialty Exam: Physical Exam  Constitutional: She is oriented to person, place, and time. She appears well-developed and well-nourished.  HENT:  Head: Normocephalic and atraumatic.  Eyes: Pupils are equal, round, and reactive to light.  Neck: Normal range of motion.  Cardiovascular: Normal rate, regular rhythm and normal heart sounds.   Respiratory: Effort normal and breath sounds normal.  GI: Soft. Bowel sounds are normal.  Musculoskeletal: Normal range of motion.  Neurological: She is alert and oriented to person, place, and time.  Skin: Skin is warm and dry.    Review of Systems  Psychiatric/Behavioral: Positive for hallucinations (auditory) and substance abuse (ETOH. Marijuana). Negative for depression, memory loss and suicidal ideas. The patient is not nervous/anxious and does not have insomnia.   All other systems reviewed and are negative.   BP 116/85 (BP Location: Left Arm)   Pulse (!) 105   Temp 98.1 F (36.7 C) (Oral)   Resp 18   LMP 06/30/2016   SpO2 100%    General Appearance: Disheveled  Eye Contact:  Good  Speech:  Clear and Coherent and Normal Rate  Volume:  Normal  Mood:  Anxious, Depressed, Hopeless and Worthless  Affect:  Congruent and Depressed  Thought Process:  Coherent, Goal Directed and Linear  Orientation:  Full (Time, Place, and Person)  Thought Content:  Logical  Suicidal Thoughts:  No  Homicidal Thoughts:  No  Memory:  Immediate;   Good Recent;   Fair Remote;   Fair  Judgement:  Fair  Insight:  Good  Psychomotor Activity:  Normal  Concentration: Concentration: Fair and Attention Span: Fair  Recall:  Good  Fund of Knowledge:Good  Language: Good  Akathisia:  No  Handed:  Right  AIMS (if indicated):     Assets:  Communication Skills Desire for Improvement Housing Resilience Social  Support Transportation  Sleep:   Poor    Musculoskeletal: Unable to assess: camera  Last menstrual period 06/30/2016. BP 116/85 (BP Location: Left Arm)   Pulse (!) 105   Temp 98.1 F (36.7 C) (Oral)   Resp 18   LMP 06/30/2016   SpO2 100%   Recommendations:  Based on my evaluation the patient does not appear to have an emergency medical condition.  Outpatient resources given to patient for substance abuse and therapy.  Laveda Abbe, NP 08/13/2016, 10:56 AM

## 2016-08-13 NOTE — BH Assessment (Signed)
Assessment Note  [name] is an 30 y.o. female with history of substance abuse and depression. She presents to [facility] requesting detox. Patient reports using Xanax and Crack. She uses each drug regularly; daily. Last use of substances was late last night and this morning. She started using both substances around the age of 77. She has used substances on/off for the past 6 yrs. Patient reports current withdrawal symptoms of nasea, slight tremors, and agitation. She has participated in substance abuse treatment at [facility] 01/2016. Longest period of sobriety is 2 yrs. Patient has passive suicidal thoughts. No suicidal plan and/or intent. She has no history of suicidal attempts or gestures. She does admit to worsening depressive associated with crying spells, hopelessness, and loss of interest in usual pleasures. Patient's stressor is her relationship with her daughter. Sts, "My daughter told me she didn't want to have anything to do with me until I get off drugs". No HI. No history of harm to others or aggressive behaviors. No AVH's.   Diagnosis: Depressive Disorder and Substance Use Disorder  Past Medical History:  Past Medical History:  Diagnosis Date  . Diabetes mellitus   . DKA 05/01/2011    Past Surgical History:  Procedure Laterality Date  . TUBAL LIGATION    . WOUND EXPLORATION Left 07/26/2014   Procedure: Irrigation, debridement and exploration of elbow wounds;  Surgeon: Betha Loa, MD;  Location: [facility] OR;  Service: Orthopedics;  Laterality: Left;    Family History:  Family History  Problem Relation Age of Onset  . Coronary artery disease Mother   . Lung cancer Mother   . Heart attack Father 39  . Thyroid disease Maternal Grandmother   . Diabetes type I Daughter     Social History:  reports that she has been smoking Cigarettes.  She has a 15.00 pack-year smoking history. She has never used smokeless tobacco. She reports that she uses drugs, including Cocaine and Marijuana. She  reports that she does not drink alcohol.  Additional Social History:  Alcohol / Drug Use Pain Medications: see PTA meds Prescriptions: see PTA meds Over the Counter: see PTA meds History of alcohol / drug use?: Yes Longest period of sobriety (when/how long): 2 years (9 months ago) Negative Consequences of Use: Personal relationships Withdrawal Symptoms: Nausea / Vomiting Substance #1 Name of Substance 1: crack cocaine  1 - Age of First Use: 30 yrs old 1 - Amount (size/oz): "As much as I can get" 1 - Frequency: daily for the past 2-3 months  1 - Duration: on-going; 6 yrs  1 - Last Use / Amount: "last night" Substance #2 Name of Substance 2: Xanax 2 - Age of First Use: 30 yrs old  2 - Amount (size/oz): 10 pills; "10 footballs..1mg  per pill" 2 - Frequency: on-going  2 - Duration: 6 months  2 - Last Use / Amount: 08/13/2016  CIWA:   COWS:    Allergies:  Allergies  Allergen Reactions  . Penicillins Anaphylaxis    .[name] KitchenHas patient had a PCN reaction causing immediate rash, facial/tongue/throat swelling, SOB or lightheadedness with hypotension: Yes Has patient had a PCN reaction causing severe rash involving mucus membranes or skin necrosis: Yes Has patient had a PCN reaction that required hospitalization Yes Has patient had a PCN reaction occurring within the last 10 years: No If all of the above answers are "NO", then may proceed with Cephalosporin use.   . Codeine Nausea And Vomiting  . Tramadol Itching    Home Medications:  (  Not in a hospital admission)  OB/GYN Status:  Patient's last menstrual period was 06/30/2016.  General Assessment Data Location of Assessment: [facility] TTS Assessment: In system Is this a Tele or Face-to-Face Assessment?: Face-to-Face Is this an Initial Assessment or a Re-assessment for this encounter?: Initial Assessment Marital status: Married [employer] name:  (unk) Is patient pregnant?: No Pregnancy Status: No Living Arrangements: Other (Comment)  (with friends ) Can pt return to current living arrangement?: Yes Admission Status: Voluntary Is patient capable of signing voluntary admission?: Yes Referral Source: Self/Family/Friend Insurance type:  (Medicaid )     Crisis Care Plan Living Arrangements: Other (Comment) (with friends ) Legal Guardian: Other: (no legal guardian ) Name of Psychiatrist:  (no psychiatrist ) Name of Therapist:  (no therapist)  Education Status Is patient currently in school?: No Current Grade:  (no ) Highest grade of school patient has completed:  (unk) Name of school:  (n/a) Contact person:  (n/a)  Risk to self with the past 6 months Suicidal Ideation: No-Not Currently/Within Last 6 Months Has patient been a risk to self within the past 6 months prior to admission? : No Suicidal Intent: No-Not Currently/Within Last 6 Months Has patient had any suicidal intent within the past 6 months prior to admission? : No Is patient at risk for suicide?: No Suicidal Plan?: No Has patient had any suicidal plan within the past 6 months prior to admission? : No Access to Means: Yes Specify Access to Suicidal Means:  (able to obtain medications (prescription and OTC)) What has been your use of drugs/alcohol within the last 12 months?:  (crack cocaine and xanax) Previous Attempts/Gestures: No How many times?:  (0) Other Self Harm Risks:  (denies ) Triggers for Past Attempts: Other (Comment) (no previous attempts or gestures) Intentional Self Injurious Behavior: None Family Suicide History: No (no family history of mental health illness) Recent stressful life event(s): Other (Comment) Persecutory voices/beliefs?: No Depression: Yes Depression Symptoms: Feeling angry/irritable, Feeling worthless/self pity, Loss of interest in usual pleasures, Guilt, Fatigue, Isolating, Tearfulness, Insomnia, Despondent Substance abuse history and/or treatment for substance abuse?: No Suicide prevention information given to  non-admitted patients: Not applicable  Risk to Others within the past 6 months Homicidal Ideation: No Does patient have any lifetime risk of violence toward others beyond the six months prior to admission? : No Thoughts of Harm to Others: No Current Homicidal Intent: No Current Homicidal Plan: No Access to Homicidal Means: No Identified Victim:  (n/a) History of harm to others?: No Assessment of Violence: None Noted Violent Behavior Description:  (patient is calm and cooperative ) Does patient have access to weapons?: No Criminal Charges Pending?: No Does patient have a court date: No Is patient on probation?: No  Psychosis Hallucinations: None noted Delusions: None noted  Mental Status Report Appearance/Hygiene: Disheveled Eye Contact: Good Motor Activity: Freedom of movement Speech: Logical/coherent Level of Consciousness: Alert Mood: Depressed Affect: Appropriate to circumstance Anxiety Level: None Thought Processes: Relevant, Coherent Judgement: Impaired Orientation: Person, Place, Time, Situation Obsessive Compulsive Thoughts/Behaviors: None  Cognitive Functioning Concentration: Decreased Memory: Recent Intact, Remote Intact IQ: Average Level of Function:  (n/a) Insight: Fair Impulse Control: Poor Appetite: Poor Weight Loss:  (patient unsure ) Weight Gain:  (no wt. gain reported) Sleep: Decreased Total Hours of Sleep:  (varies ) Vegetative Symptoms: None  ADLScreening [facility] Assessment Services) Patient's cognitive ability adequate to safely complete daily activities?: Yes Patient able to express need for assistance with ADLs?: Yes Independently performs ADLs?: Yes (appropriate  for developmental age)  Prior Inpatient Therapy Prior Inpatient Therapy: Yes Prior Therapy Dates:  (01/2016) Prior Therapy Facilty/Provider(s):  ([facility] ) Reason for Treatment:  (substance abuse and depression)  Prior Outpatient Therapy Prior Outpatient Therapy: No Prior  Therapy Dates:  (n/a) Prior Therapy Facilty/Provider(s):  (n/a) Reason for Treatment:  (n/a) Does patient have an ACCT team?: No Does patient have Intensive In-House Services?  : No Does patient have [facility] services? : No Does patient have P4CC services?: No  ADL Screening (condition at time of admission) Patient's cognitive ability adequate to safely complete daily activities?: Yes Is the patient deaf or have difficulty hearing?: No Does the patient have difficulty seeing, even when wearing glasses/contacts?: No Does the patient have difficulty concentrating, remembering, or making decisions?: No Patient able to express need for assistance with ADLs?: Yes Does the patient have difficulty dressing or bathing?: No Independently performs ADLs?: Yes (appropriate for developmental age) Does the patient have difficulty walking or climbing stairs?: No Weakness of Arms/Hands: None  Home Assistive Devices/Equipment Home Assistive Devices/Equipment: None    Abuse/Neglect Assessment (Assessment to be complete while patient is alone) Physical Abuse: Denies Verbal Abuse: Denies Sexual Abuse: Denies Exploitation of patient/patient's resources: Denies Self-Neglect: Denies Values / Beliefs Cultural Requests During Hospitalization: None Spiritual Requests During Hospitalization: None   Advance Directives (For Healthcare) Does patient have an advance directive?: No Would patient like information on creating an advanced directive?: No - patient declined information Nutrition Screen- [facility] Patient's home diet: Regular  Additional Information 1:1 In Past 12 Months?: No CIRT Risk: No Elopement Risk: No Does patient have medical clearance?: Yes     Disposition: Patient evaluated by Elta GuadeloupeLaurie Parks, NP and does not meet criteria for INPT treatment. Patient to follow up with residential substance abuse programs. Assessor provided patient with a list of referrals.  Disposition Initial  Assessment Completed for this Encounter: Yes Disposition of Patient: Outpatient treatment (Per [facility])  On Site Evaluation by:   Reviewed with Physician:  Elta GuadeloupeLaurie Parks, NP  Melynda RipplePerry, Tessi Eustache [facility] Center[facility] 08/13/2016 9:49 AM

## 2016-10-03 ENCOUNTER — Emergency Department (HOSPITAL_COMMUNITY)
Admission: EM | Admit: 2016-10-03 | Discharge: 2016-10-04 | Disposition: A | Discharge status: Home / Self Care | Payer: Medicaid Other | Attending: Emergency Medicine | Admitting: Emergency Medicine

## 2016-10-03 ENCOUNTER — Emergency Department (HOSPITAL_COMMUNITY): Payer: Medicaid Other

## 2016-10-03 ENCOUNTER — Encounter (HOSPITAL_COMMUNITY): Payer: Self-pay | Admitting: Emergency Medicine

## 2016-10-03 DIAGNOSIS — F17210 Nicotine dependence, cigarettes, uncomplicated: Secondary | ICD-10-CM | POA: Diagnosis not present

## 2016-10-03 DIAGNOSIS — N3000 Acute cystitis without hematuria: Secondary | ICD-10-CM | POA: Insufficient documentation

## 2016-10-03 DIAGNOSIS — Z794 Long term (current) use of insulin: Secondary | ICD-10-CM | POA: Insufficient documentation

## 2016-10-03 DIAGNOSIS — E109 Type 1 diabetes mellitus without complications: Secondary | ICD-10-CM | POA: Diagnosis not present

## 2016-10-03 DIAGNOSIS — R1031 Right lower quadrant pain: Secondary | ICD-10-CM | POA: Diagnosis present

## 2016-10-03 LAB — COMPREHENSIVE METABOLIC PANEL
ALK PHOS: 68 U/L (ref 38–126)
ALT: 14 U/L (ref 14–54)
ANION GAP: 6 (ref 5–15)
AST: 15 U/L (ref 15–41)
Albumin: 3.5 g/dL (ref 3.5–5.0)
BUN: 15 mg/dL (ref 6–20)
CALCIUM: 9.1 mg/dL (ref 8.9–10.3)
CO2: 27 mmol/L (ref 22–32)
Chloride: 99 mmol/L — ABNORMAL LOW (ref 101–111)
Creatinine, Ser: 0.63 mg/dL (ref 0.44–1.00)
Glucose, Bld: 345 mg/dL — ABNORMAL HIGH (ref 65–99)
Potassium: 3.8 mmol/L (ref 3.5–5.1)
SODIUM: 132 mmol/L — AB (ref 135–145)
TOTAL PROTEIN: 6.9 g/dL (ref 6.5–8.1)
Total Bilirubin: 0.4 mg/dL (ref 0.3–1.2)

## 2016-10-03 LAB — CBC WITH DIFFERENTIAL/PLATELET
Basophils Absolute: 0 10*3/uL (ref 0.0–0.1)
Basophils Relative: 0 %
EOS PCT: 1 %
Eosinophils Absolute: 0.1 10*3/uL (ref 0.0–0.7)
HEMATOCRIT: 38.7 % (ref 36.0–46.0)
Hemoglobin: 12.9 g/dL (ref 12.0–15.0)
LYMPHS ABS: 4.7 10*3/uL — AB (ref 0.7–4.0)
LYMPHS PCT: 43 %
MCH: 29 pg (ref 26.0–34.0)
MCHC: 33.3 g/dL (ref 30.0–36.0)
MCV: 87 fL (ref 78.0–100.0)
MONO ABS: 0.9 10*3/uL (ref 0.1–1.0)
MONOS PCT: 8 %
NEUTROS ABS: 5.3 10*3/uL (ref 1.7–7.7)
Neutrophils Relative %: 48 %
PLATELETS: 276 10*3/uL (ref 150–400)
RBC: 4.45 MIL/uL (ref 3.87–5.11)
RDW: 16.6 % — AB (ref 11.5–15.5)
WBC: 11 10*3/uL — ABNORMAL HIGH (ref 4.0–10.5)

## 2016-10-03 LAB — URINALYSIS, ROUTINE W REFLEX MICROSCOPIC
Bilirubin Urine: NEGATIVE
Glucose, UA: >=500 mg/dL — AB
KETONES UR: NEGATIVE mg/dL
LEUKOCYTES UA: NEGATIVE
NITRITE: NEGATIVE
PH: 6 (ref 5.0–8.0)
PROTEIN: NEGATIVE mg/dL
Specific Gravity, Urine: 1.01 (ref 1.005–1.030)

## 2016-10-03 LAB — URINALYSIS, MICROSCOPIC (REFLEX)

## 2016-10-03 LAB — PREGNANCY, URINE: Preg Test, Ur: NEGATIVE

## 2016-10-03 LAB — CBG MONITORING, ED: Glucose-Capillary: 335 mg/dL — ABNORMAL HIGH (ref 65–99)

## 2016-10-03 MED ORDER — CIPROFLOXACIN HCL 500 MG PO TABS: 500.0000 mg | ORAL_TABLET | Freq: Two times a day (BID) | ORAL | 0 refills | Status: DC

## 2016-10-03 MED ORDER — IOPAMIDOL (ISOVUE-300) INJECTION 61%
100.0000 mL | Freq: Once | INTRAVENOUS | Status: AC | PRN
  Administered 2016-10-03: 100 mL via INTRAVENOUS

## 2016-10-03 MED ORDER — SODIUM CHLORIDE 0.9 % IV BOLUS (SEPSIS)
500.0000 mL | Freq: Once | INTRAVENOUS | Status: AC
  Administered 2016-10-03: 500 mL via INTRAVENOUS

## 2016-10-03 MED ORDER — CIPROFLOXACIN IN D5W 400 MG/200ML IV SOLN
400.0000 mg | Freq: Once | INTRAVENOUS | Status: AC
  Administered 2016-10-03: 400 mg via INTRAVENOUS
  Filled 2016-10-03: qty 200

## 2016-10-03 MED ORDER — HYDROCODONE-ACETAMINOPHEN 5-325 MG PO TABS: 1.0000 | ORAL_TABLET | Freq: Four times a day (QID) | ORAL | 0 refills | Status: DC | PRN

## 2016-10-03 MED ORDER — ONDANSETRON HCL 4 MG/2ML IJ SOLN
4.0000 mg | Freq: Once | INTRAMUSCULAR | Status: AC
  Administered 2016-10-03: 4 mg via INTRAVENOUS
  Filled 2016-10-03: qty 2

## 2016-10-03 NOTE — Discharge Instructions (Signed)
Follow-up with your doctor after he finished the antibiotics

## 2016-10-03 NOTE — ED Triage Notes (Signed)
On Friday onset sharp pain RLQ pain, today onset of vomiting.

## 2016-10-03 NOTE — ED Provider Notes (Signed)
[facility] DEPT Provider Note   CSN: [id number] Arrival date & time: 10/03/16  1641     History   Chief Complaint Chief Complaint  Patient presents with  . Abdominal Pain    HPI [name] is a 30 y.o. female.  Patient complains of right lower quadrant pain for couple days. She also complains of some dysuria and chills   The history is provided by the patient. No language interpreter was used.  Abdominal Pain   This is a new problem. The current episode started 2 days ago. The problem occurs daily. The problem has not changed since onset.The pain is associated with an unknown factor. The pain is located in the RLQ. The quality of the pain is aching. The pain is at a severity of 6/10. The pain is moderate. Associated symptoms include dysuria. Pertinent negatives include diarrhea, flatus, frequency, hematuria and headaches.    Past Medical History:  Diagnosis Date  . Diabetes mellitus   . DKA 05/01/2011    Patient Active Problem List   Diagnosis Date Noted  . UTI (lower urinary tract infection) 07/14/2016  . Panic disorder without agoraphobia with moderate panic attacks 01/20/2016  . Benzodiazepine abuse 01/20/2016  . Substance induced mood disorder (HCC) 01/19/2016  . Diabetes mellitus type 1, uncontrolled (HCC) 05/01/2011  . Leukocytosis 05/01/2011    Past Surgical History:  Procedure Laterality Date  . TUBAL LIGATION    . WOUND EXPLORATION Left 07/26/2014   Procedure: Irrigation, debridement and exploration of elbow wounds;  Surgeon: Betha Loa, MD;  Location: [facility] OR;  Service: Orthopedics;  Laterality: Left;    OB History    Gravida Para Term Preterm AB Living   2 2           SAB TAB Ectopic Multiple Live Births                   Home Medications    Prior to Admission medications   Medication Sig Start Date End Date Taking? Authorizing Provider  acetaminophen (TYLENOL) 500 MG tablet Take 500 mg by mouth every 6 (six) hours as needed for mild  pain or moderate pain.   Yes Historical Provider, MD  insulin aspart (NOVOLOG FLEXPEN) 100 UNIT/ML FlexPen Inject 2 Units into the skin 3 (three) times daily with meals. Per sliding scale 2units per 15 carbs   Yes Historical Provider, MD  Insulin Glargine (LANTUS SOLOSTAR) 100 UNIT/ML Solostar Pen Inject 35 Units into the skin daily at 10 pm. Patient taking differently: Inject 40 Units into the skin daily at 10 pm.  07/15/16  Yes Lonia Blood, MD  ciprofloxacin (CIPRO) 500 MG tablet Take 1 tablet (500 mg total) by mouth 2 (two) times daily. One po bid x 7 days 10/03/16   Bethann Berkshire, MD  HYDROcodone-acetaminophen (NORCO/VICODIN) 5-325 MG tablet Take 1 tablet by mouth every 6 (six) hours as needed for moderate pain. 10/03/16   Bethann Berkshire, MD  Insulin Syringe 27G X 1/2" 0.5 ML MISC 1 Syringe by Does not apply route 3 (three) times daily with meals. 07/15/16   Lonia Blood, MD    Family History Family History  Problem Relation Age of Onset  . Coronary artery disease Mother   . Lung cancer Mother   . Heart attack Father 23  . Thyroid disease Maternal Grandmother   . Diabetes type I Daughter     Social History Social History  Substance Use Topics  . Smoking status: Current Every Day Smoker  Packs/day: 1.00    Years: 15.00    Types: Cigarettes  . Smokeless tobacco: Never Used     Comment: She is currently trying to quit.  . Alcohol use No     Allergies   Penicillins; Codeine; and Tramadol   Review of Systems Review of Systems  Constitutional: Negative for appetite change and fatigue.  HENT: Negative for congestion, ear discharge and sinus pressure.   Eyes: Negative for discharge.  Respiratory: Negative for cough.   Cardiovascular: Negative for chest pain.  Gastrointestinal: Positive for abdominal pain. Negative for diarrhea and flatus.  Genitourinary: Positive for dysuria. Negative for frequency and hematuria.  Musculoskeletal: Negative for back pain.  Skin:  Negative for rash.  Neurological: Negative for seizures and headaches.  Psychiatric/Behavioral: Negative for hallucinations.     Physical Exam Updated Vital Signs BP 111/78   Pulse 70   Temp 98.4 F (36.9 C) (Oral)   Resp 20   Ht 5' (1.524 m)   Wt 107 lb (48.5 kg)   LMP 09/30/2016   SpO2 97%   BMI 20.90 kg/m   Physical Exam  Constitutional: She is oriented to person, place, and time. She appears well-developed.  HENT:  Head: Normocephalic.  Eyes: Conjunctivae and EOM are normal. No scleral icterus.  Neck: Neck supple. No thyromegaly present.  Cardiovascular: Normal rate and regular rhythm.  Exam reveals no gallop and no friction rub.   No murmur heard. Pulmonary/Chest: No stridor. She has no wheezes. She has no rales. She exhibits no tenderness.  Abdominal: She exhibits no distension. There is tenderness. There is no rebound.  Moderate right lower quadrant tenderness.  Musculoskeletal: Normal range of motion. She exhibits no edema.  Lymphadenopathy:    She has no cervical adenopathy.  Neurological: She is oriented to person, place, and time. She exhibits normal muscle tone. Coordination normal.  Skin: No rash noted. No erythema.  Psychiatric: She has a normal mood and affect. Her behavior is normal.     ED Treatments / Results  Labs (all labs ordered are listed, but only abnormal results are displayed) Labs Reviewed  COMPREHENSIVE METABOLIC PANEL - Abnormal; Notable for the following:       Result Value   Sodium 132 (*)    Chloride 99 (*)    Glucose, Bld 345 (*)    All other components within normal limits  URINALYSIS, ROUTINE W REFLEX MICROSCOPIC - Abnormal; Notable for the following:    Glucose, UA >=500 (*)    Hgb urine dipstick LARGE (*)    All other components within normal limits  URINALYSIS, MICROSCOPIC (REFLEX) - Abnormal; Notable for the following:    Bacteria, UA MANY (*)    Squamous Epithelial / LPF 0-5 (*)    All other components within normal  limits  CBC WITH DIFFERENTIAL/PLATELET - Abnormal; Notable for the following:    WBC 11.0 (*)    RDW 16.6 (*)    Lymphs Abs 4.7 (*)    All other components within normal limits  CBG MONITORING, ED - Abnormal; Notable for the following:    Glucose-Capillary 335 (*)    All other components within normal limits  URINE CULTURE  PREGNANCY, URINE    EKG  EKG Interpretation None       Radiology No results found.  Procedures Procedures (including critical care time)  Medications Ordered in ED Medications  ciprofloxacin (CIPRO) IVPB 400 mg (400 mg Intravenous New Bag/Given 10/03/16 2109)  sodium chloride 0.9 % bolus 500 mL (0  mLs Intravenous Stopped 10/03/16 2056)  ondansetron (ZOFRAN) injection 4 mg (4 mg Intravenous Given 10/03/16 1953)     Initial Impression / Assessment and Plan / ED Course  I have reviewed the triage vital signs and the nursing notes.  Pertinent labs & imaging results that were available during my care of the patient were reviewed by me and considered in my medical decision making (see chart for details).  Clinical Course     Patient with abdominal pain. Urinalysis suggests urinary tract infection. Patient will get a CT scan of her abdomen to rule out appendicitis.  Final Clinical Impressions(s) / ED Diagnoses   Final diagnoses:  Acute cystitis without hematuria    New Prescriptions New Prescriptions   CIPROFLOXACIN (CIPRO) 500 MG TABLET    Take 1 tablet (500 mg total) by mouth 2 (two) times daily. One po bid x 7 days   HYDROCODONE-ACETAMINOPHEN (NORCO/VICODIN) 5-325 MG TABLET    Take 1 tablet by mouth every 6 (six) hours as needed for moderate pain.     Bethann Berkshire, MD 10/03/16 2203

## 2016-10-06 LAB — URINE CULTURE: Culture: >=100000 — AB

## 2016-10-07 ENCOUNTER — Telehealth: Payer: Self-pay

## 2016-10-07 NOTE — Telephone Encounter (Signed)
Post ED Visit - Positive Culture Follow-up  Culture report reviewed by antimicrobial stewardship pharmacist:  []  Enzo Bi, Pharm.D. []  Celedonio Miyamoto, [address].D., BCPS [x]  Garvin Fila, Pharm.D. []  Georgina Pillion, Pharm.D., BCPS []  [place], [address].D., BCPS, AAHIVP []  Estella Husk, Pharm.D., BCPS, AAHIVP []  Tennis Must, Pharm.D. []  Sherle Poe, [address].D.  Positive urine culture Treated with Ciprofloxacin, organism sensitive to the same and no further patient follow-up is required at this time.  Jerry Caras 10/07/2016, 9:09 AM

## 2016-11-15 ENCOUNTER — Emergency Department (HOSPITAL_COMMUNITY)
Admission: EM | Admit: 2016-11-15 | Discharge: 2016-11-16 | Disposition: A | Discharge status: Home / Self Care | Payer: Medicaid Other | Attending: Emergency Medicine | Admitting: Emergency Medicine

## 2016-11-15 ENCOUNTER — Encounter (HOSPITAL_COMMUNITY): Payer: Self-pay

## 2016-11-15 DIAGNOSIS — Z794 Long term (current) use of insulin: Secondary | ICD-10-CM | POA: Insufficient documentation

## 2016-11-15 DIAGNOSIS — R739 Hyperglycemia, unspecified: Secondary | ICD-10-CM

## 2016-11-15 DIAGNOSIS — F17210 Nicotine dependence, cigarettes, uncomplicated: Secondary | ICD-10-CM | POA: Diagnosis not present

## 2016-11-15 DIAGNOSIS — E1165 Type 2 diabetes mellitus with hyperglycemia: Secondary | ICD-10-CM | POA: Insufficient documentation

## 2016-11-15 DIAGNOSIS — R238 Other skin changes: Secondary | ICD-10-CM | POA: Diagnosis not present

## 2016-11-15 LAB — CBC WITH DIFFERENTIAL/PLATELET
BASOS ABS: 0 10*3/uL (ref 0.0–0.1)
BASOS PCT: 0 %
EOS ABS: 0.1 10*3/uL (ref 0.0–0.7)
Eosinophils Relative: 1 %
HEMATOCRIT: 38.7 % (ref 36.0–46.0)
HEMOGLOBIN: 13.4 g/dL (ref 12.0–15.0)
LYMPHS PCT: 37 %
Lymphs Abs: 4.5 10*3/uL — ABNORMAL HIGH (ref 0.7–4.0)
MCH: 29.5 pg (ref 26.0–34.0)
MCHC: 34.6 g/dL (ref 30.0–36.0)
MCV: 85.1 fL (ref 78.0–100.0)
Monocytes Absolute: 0.9 10*3/uL (ref 0.1–1.0)
Monocytes Relative: 7 %
Neutro Abs: 6.6 10*3/uL (ref 1.7–7.7)
Neutrophils Relative %: 55 %
Platelets: 258 10*3/uL (ref 150–400)
RBC: 4.55 MIL/uL (ref 3.87–5.11)
RDW: 15.3 % (ref 11.5–15.5)
WBC: 12.1 10*3/uL — AB (ref 4.0–10.5)

## 2016-11-15 LAB — BASIC METABOLIC PANEL
ANION GAP: 8 (ref 5–15)
BUN: 21 mg/dL — ABNORMAL HIGH (ref 6–20)
CALCIUM: 9.1 mg/dL (ref 8.9–10.3)
CO2: 27 mmol/L (ref 22–32)
CREATININE: 0.5 mg/dL (ref 0.44–1.00)
Chloride: 100 mmol/L — ABNORMAL LOW (ref 101–111)
GFR calc Af Amer: >60 mL/min (ref >60)
GFR calc non Af Amer: >60 mL/min (ref >60)
Glucose, Bld: 312 mg/dL — ABNORMAL HIGH (ref 65–99)
Potassium: 4 mmol/L (ref 3.5–5.1)
SODIUM: 135 mmol/L (ref 135–145)

## 2016-11-15 LAB — CBG MONITORING, ED
GLUCOSE-CAPILLARY: 297 mg/dL — AB (ref 65–99)
Glucose-Capillary: 401 mg/dL — ABNORMAL HIGH (ref 65–99)

## 2016-11-15 MED ORDER — HYDROCODONE-ACETAMINOPHEN 5-325 MG PO TABS
1.0000 | ORAL_TABLET | Freq: Once | ORAL | Status: AC
  Administered 2016-11-15: 1 via ORAL
  Filled 2016-11-15: qty 1

## 2016-11-15 MED ORDER — INSULIN ASPART 100 UNIT/ML ~~LOC~~ SOLN: 4.0000 [IU] | Freq: Once | SUBCUTANEOUS | Status: DC

## 2016-11-15 NOTE — ED Triage Notes (Addendum)
Pt has abscess area to right ear lobe for one day. Increasing in size. Reports pain on the inside of ear as well. Pt is diabetic and blood sugar is 401

## 2016-11-16 MED ORDER — SULFAMETHOXAZOLE-TRIMETHOPRIM 800-160 MG PO TABS
1.0000 | ORAL_TABLET | Freq: Once | ORAL | Status: AC
  Administered 2016-11-16: 1 via ORAL
  Filled 2016-11-16: qty 1

## 2016-11-16 MED ORDER — HYDROCODONE-ACETAMINOPHEN 5-325 MG PO TABS: 1.0000 | ORAL_TABLET | ORAL | 0 refills | Status: DC | PRN

## 2016-11-16 MED ORDER — SULFAMETHOXAZOLE-TRIMETHOPRIM 800-160 MG PO TABS: 1.0000 | ORAL_TABLET | Freq: Two times a day (BID) | ORAL | 0 refills | Status: AC

## 2016-11-16 NOTE — Discharge Instructions (Signed)
Take your next dose of the antibiotic tomorrow morning. Start warm compresses to the papule site. Avoid squeezing this infection.  Warm compresses and antibiotics should resolve this problem but it persists or gets larger,  you may need to have it lanced.  Please keep a close watch on your blood glucose levels and use your sliding scale insulin to keep it better controlled.

## 2016-11-17 NOTE — ED Provider Notes (Signed)
[facility] Provider Note   CSN: [id number] Arrival date & time: 11/15/16  1818     History   Chief Complaint Chief Complaint  Patient presents with  . Abscess  . Hyperglycemia    HPI [name] is a 31 y.o. female with a history of Type I DM with a cbg of 401(checked here) presenting with a one day history of a painful abscess at her right ear.  She denies a history of similar symptoms.  She denies any constitutional sx including fevers, chills, nausea, vomiting, abdominal pain.  She also denies headache, but endorses radiation of pain from the infected site along her right jaw line.  She has had no treatment prior to arrival for this problem. She has been compliant with her DM medicines except did not take her novolog with the restaurant meal she ate just prior to arriving here.  The history is provided by the patient.    Past Medical History:  Diagnosis Date  . Diabetes mellitus   . DKA 05/01/2011    Patient Active Problem List   Diagnosis Date Noted  . UTI (lower urinary tract infection) 07/14/2016  . Panic disorder without agoraphobia with moderate panic attacks 01/20/2016  . Benzodiazepine abuse 01/20/2016  . Substance induced mood disorder (HCC) 01/19/2016  . Diabetes mellitus type 1, uncontrolled (HCC) 05/01/2011  . Leukocytosis 05/01/2011    Past Surgical History:  Procedure Laterality Date  . TUBAL LIGATION    . WOUND EXPLORATION Left 07/26/2014   Procedure: Irrigation, debridement and exploration of elbow wounds;  Surgeon: Betha Loa, MD;  Location: [facility] OR;  Service: Orthopedics;  Laterality: Left;    OB History    Gravida Para Term Preterm AB Living   2 2           SAB TAB Ectopic Multiple Live Births                   Home Medications    Prior to Admission medications   Medication Sig Start Date End Date Taking? Authorizing Provider  acetaminophen (TYLENOL) 500 MG tablet Take 500 mg by mouth every 6 (six) hours as needed for mild  pain or moderate pain.    Historical Provider, MD  ciprofloxacin (CIPRO) 500 MG tablet Take 1 tablet (500 mg total) by mouth 2 (two) times daily. One po bid x 7 days 10/03/16   Bethann Berkshire, MD  HYDROcodone-acetaminophen (NORCO/VICODIN) 5-325 MG tablet Take 1 tablet by mouth every 4 (four) hours as needed. 11/16/16   Burgess Amor, PA-C  insulin aspart (NOVOLOG FLEXPEN) 100 UNIT/ML FlexPen Inject 2 Units into the skin 3 (three) times daily with meals. Per sliding scale 2units per 15 carbs    Historical Provider, MD  Insulin Glargine (LANTUS SOLOSTAR) 100 UNIT/ML Solostar Pen Inject 35 Units into the skin daily at 10 pm. Patient taking differently: Inject 40 Units into the skin daily at 10 pm.  07/15/16   Lonia Blood, MD  Insulin Syringe 27G X 1/2" 0.5 ML MISC 1 Syringe by Does not apply route 3 (three) times daily with meals. 07/15/16   Lonia Blood, MD  sulfamethoxazole-trimethoprim (BACTRIM DS,SEPTRA DS) 800-160 MG tablet Take 1 tablet by mouth 2 (two) times daily. 11/16/16 11/23/16  Burgess Amor, PA-C    Family History Family History  Problem Relation Age of Onset  . Coronary artery disease Mother   . Lung cancer Mother   . Heart attack Father 37  . Thyroid disease Maternal Grandmother   .  Diabetes type I Daughter     Social History Social History  Substance Use Topics  . Smoking status: Current Every Day Smoker    Packs/day: 1.00    Years: 15.00    Types: Cigarettes  . Smokeless tobacco: Never Used     Comment: She is currently trying to quit.  . Alcohol use No     Allergies   Penicillins; Codeine; and Tramadol   Review of Systems Review of Systems  Constitutional: Negative for chills and fever.  Respiratory: Negative for shortness of breath and wheezing.   Gastrointestinal: Negative for abdominal pain, nausea and vomiting.  Skin:       Negative except as mentioned in HPI.   Neurological: Negative for numbness.     Physical Exam Updated Vital Signs BP 133/79  (BP Location: Left Arm)   Pulse 100   Temp 97.5 F (36.4 C) (Oral)   Resp 18   Ht 5' (1.524 m)   Wt 50.3 kg   LMP 10/31/2016   SpO2 100%   BMI 21.68 kg/m   Physical Exam  Constitutional: She appears well-developed and well-nourished. No distress.  HENT:  Head: Normocephalic.  Right Ear: Ear canal normal.  Left Ear: Ear canal normal.  Neck: Neck supple.  Cardiovascular: Normal rate.   Pulmonary/Chest: Effort normal. She has no wheezes.  Abdominal: Soft. There is no tenderness.  Musculoskeletal: Normal range of motion. She exhibits no edema.  Lymphadenopathy:       Head (right side): No submandibular, no tonsillar, no preauricular and no posterior auricular adenopathy present.    She has no cervical adenopathy.  Skin:  Small indurated papule of skin immediately inferior to the right earlobe.  approx 3 mm diameter, no fluctuance, no pointing and no surrounding erythema.       ED Treatments / Results  Labs (all labs ordered are listed, but only abnormal results are displayed) Labs Reviewed  CBC WITH DIFFERENTIAL/PLATELET - Abnormal; Notable for the following:       Result Value   WBC 12.1 (*)    Lymphs Abs 4.5 (*)    All other components within normal limits  BASIC METABOLIC PANEL - Abnormal; Notable for the following:    Chloride 100 (*)    Glucose, Bld 312 (*)    BUN 21 (*)    All other components within normal limits  CBG MONITORING, ED - Abnormal; Notable for the following:    Glucose-Capillary 401 (*)    All other components within normal limits  CBG MONITORING, ED - Abnormal; Notable for the following:    Glucose-Capillary 297 (*)    All other components within normal limits    EKG  EKG Interpretation None       Radiology No results found.  Procedures Procedures (including critical care time)  Medications Ordered in ED Medications  HYDROcodone-acetaminophen (NORCO/VICODIN) 5-325 MG per tablet 1 tablet (1 tablet Oral Given 11/15/16 2356)    sulfamethoxazole-trimethoprim (BACTRIM DS,SEPTRA DS) 800-160 MG per tablet 1 tablet (1 tablet Oral Given 11/16/16 0028)     Initial Impression / Assessment and Plan / ED Course  I have reviewed the triage vital signs and the nursing notes.  Pertinent labs & imaging results that were available during my care of the patient were reviewed by me and considered in my medical decision making (see chart for details).     Labs reviewed, no dka.  No drainable abscess. Pt advised warm compresses, placed on bactrim. Advised recheck for any worsening  sx, but at this time there is a fair chance this will resolve spontaneously. Advised avoiding squeezing the site.  Offered novolog injection, pt states will take hers once home.  Prn f/u with pcp or return here for any worsened sx.  Final Clinical Impressions(s) / ED Diagnoses   Final diagnoses:  Papule  Hyperglycemia    New Prescriptions Discharge Medication List as of 11/16/2016 12:09 AM    START taking these medications   Details  sulfamethoxazole-trimethoprim (BACTRIM DS,SEPTRA DS) 800-160 MG tablet Take 1 tablet by mouth 2 (two) times daily., Starting Wed 11/16/2016, Until Wed 11/23/2016, Print         Burgess Amor, PA-C 11/17/16 1435    Devoria Albe, MD 11/17/16 2304

## 2017-04-01 ENCOUNTER — Emergency Department (HOSPITAL_COMMUNITY)
Admission: EM | Admit: 2017-04-01 | Discharge: 2017-04-01 | Disposition: A | Discharge status: Home / Self Care | Payer: Medicaid Other | Attending: Emergency Medicine | Admitting: Emergency Medicine

## 2017-04-01 ENCOUNTER — Encounter (HOSPITAL_COMMUNITY): Payer: Self-pay | Admitting: Adult Health

## 2017-04-01 DIAGNOSIS — N12 Tubulo-interstitial nephritis, not specified as acute or chronic: Secondary | ICD-10-CM

## 2017-04-01 DIAGNOSIS — Z79899 Other long term (current) drug therapy: Secondary | ICD-10-CM | POA: Diagnosis not present

## 2017-04-01 DIAGNOSIS — F17210 Nicotine dependence, cigarettes, uncomplicated: Secondary | ICD-10-CM | POA: Insufficient documentation

## 2017-04-01 DIAGNOSIS — E1065 Type 1 diabetes mellitus with hyperglycemia: Secondary | ICD-10-CM | POA: Insufficient documentation

## 2017-04-01 DIAGNOSIS — R739 Hyperglycemia, unspecified: Secondary | ICD-10-CM

## 2017-04-01 DIAGNOSIS — R509 Fever, unspecified: Secondary | ICD-10-CM | POA: Diagnosis present

## 2017-04-01 LAB — BASIC METABOLIC PANEL
Anion gap: 9 (ref 5–15)
BUN: 19 mg/dL (ref 6–20)
CALCIUM: 8.5 mg/dL — AB (ref 8.9–10.3)
CHLORIDE: 101 mmol/L (ref 101–111)
CO2: 23 mmol/L (ref 22–32)
CREATININE: 0.68 mg/dL (ref 0.44–1.00)
Glucose, Bld: 276 mg/dL — ABNORMAL HIGH (ref 65–99)
Potassium: 3.4 mmol/L — ABNORMAL LOW (ref 3.5–5.1)
SODIUM: 133 mmol/L — AB (ref 135–145)

## 2017-04-01 LAB — URINALYSIS, ROUTINE W REFLEX MICROSCOPIC
BILIRUBIN URINE: NEGATIVE
Hgb urine dipstick: NEGATIVE
KETONES UR: 5 mg/dL — AB
NITRITE: NEGATIVE
PH: 5 (ref 5.0–8.0)
Protein, ur: 100 mg/dL — AB
SPECIFIC GRAVITY, URINE: 1.036 — AB (ref 1.005–1.030)

## 2017-04-01 LAB — CBC
HCT: 41.7 % (ref 36.0–46.0)
Hemoglobin: 14.1 g/dL (ref 12.0–15.0)
MCH: 29.4 pg (ref 26.0–34.0)
MCHC: 33.8 g/dL (ref 30.0–36.0)
MCV: 86.9 fL (ref 78.0–100.0)
PLATELETS: 250 10*3/uL (ref 150–400)
RBC: 4.8 MIL/uL (ref 3.87–5.11)
RDW: 15 % (ref 11.5–15.5)
WBC: 10.5 10*3/uL (ref 4.0–10.5)

## 2017-04-01 LAB — CBG MONITORING, ED
GLUCOSE-CAPILLARY: 280 mg/dL — AB (ref 65–99)
Glucose-Capillary: 290 mg/dL — ABNORMAL HIGH (ref 65–99)

## 2017-04-01 LAB — PREGNANCY, URINE: Preg Test, Ur: NEGATIVE

## 2017-04-01 MED ORDER — PHENAZOPYRIDINE HCL 100 MG PO TABS: 100.0000 mg | ORAL_TABLET | Freq: Three times a day (TID) | ORAL | 0 refills | Status: DC | PRN

## 2017-04-01 MED ORDER — ONDANSETRON 4 MG PO TBDP
4.0000 mg | ORAL_TABLET | Freq: Once | ORAL | Status: AC
  Administered 2017-04-01: 4 mg via ORAL
  Filled 2017-04-01: qty 1

## 2017-04-01 MED ORDER — CIPROFLOXACIN HCL 500 MG PO TABS: 500.0000 mg | ORAL_TABLET | Freq: Two times a day (BID) | ORAL | 0 refills | Status: AC

## 2017-04-01 MED ORDER — CIPROFLOXACIN HCL 250 MG PO TABS
500.0000 mg | ORAL_TABLET | Freq: Once | ORAL | Status: AC
  Administered 2017-04-01: 500 mg via ORAL
  Filled 2017-04-01: qty 2

## 2017-04-01 MED ORDER — ONDANSETRON 4 MG PO TBDP: 4.0000 mg | ORAL_TABLET | Freq: Three times a day (TID) | ORAL | 0 refills | Status: DC | PRN

## 2017-04-01 MED ORDER — POTASSIUM CHLORIDE CRYS ER 20 MEQ PO TBCR
40.0000 meq | EXTENDED_RELEASE_TABLET | Freq: Once | ORAL | Status: AC
  Administered 2017-04-01: 40 meq via ORAL
  Filled 2017-04-01: qty 2

## 2017-04-01 MED ORDER — SODIUM CHLORIDE 0.9 % IV BOLUS (SEPSIS)
1000.0000 mL | Freq: Once | INTRAVENOUS | Status: AC
  Administered 2017-04-01: 1000 mL via INTRAVENOUS

## 2017-04-01 NOTE — ED Provider Notes (Signed)
[facility]-EM[facility] DEPT Provider Note   CSN: [id number] Arrival date & time: 04/01/17  1319   History   Chief Complaint Chief Complaint  Patient presents with  . Hyperglycemia  . Fever    HPI [name] is a 31 y.o. female.  Patient reports a 3 day h/o fevers, chills, nausea, vomiting, elevated BGs.  She reports dysuria that started at that time as well.  Reports fevers TMax 101.33F.  Last measured fever was this am 100.33F.  She took tylenol prior to arrival.  She took AZO on Wednesday.  She reports urgency, frequency, back pain.  Denies frequent UTIs but notes she required hospitalization for pyelonephritis before.  Allergic to PCNs.  Takes no other medications.  LMP last month.  She reports that BG this am was in the 400s.  She took 4 units of Novolog.  She reports compliance with insulin regimen (novolog on SSI, Lantus 40 u daily).  She is a T1DM.  She reports her PCP recently passed and she is in need of a new one.       Past Medical History:  Diagnosis Date  . Diabetes mellitus   . DKA 05/01/2011    Patient Active Problem List   Diagnosis Date Noted  . UTI (lower urinary tract infection) 07/14/2016  . Panic disorder without agoraphobia with moderate panic attacks 01/20/2016  . Benzodiazepine abuse 01/20/2016  . Substance induced mood disorder (HCC) 01/19/2016  . Diabetes mellitus type 1, uncontrolled (HCC) 05/01/2011  . Leukocytosis 05/01/2011    Past Surgical History:  Procedure Laterality Date  . TUBAL LIGATION    . WOUND EXPLORATION Left 07/26/2014   Procedure: Irrigation, debridement and exploration of elbow wounds;  Surgeon: Betha Loa, MD;  Location: [facility] OR;  Service: Orthopedics;  Laterality: Left;    OB History    Gravida Para Term Preterm AB Living   2 2           SAB TAB Ectopic Multiple Live Births                   Home Medications    Prior to Admission medications   Medication Sig Start Date End Date Taking? Authorizing Provider    acetaminophen (TYLENOL) 500 MG tablet Take 500 mg by mouth every 6 (six) hours as needed for mild pain or moderate pain.    [provider]  ciprofloxacin (CIPRO) 500 MG tablet Take 1 tablet (500 mg total) by mouth 2 (two) times daily. One po bid x 7 days 10/03/16   Bethann Berkshire, MD  HYDROcodone-acetaminophen (NORCO/VICODIN) 5-325 MG tablet Take 1 tablet by mouth every 4 (four) hours as needed. 11/16/16   Burgess Amor, PA-C  insulin aspart (NOVOLOG FLEXPEN) 100 UNIT/ML FlexPen Inject 2 Units into the skin 3 (three) times daily with meals. Per sliding scale 2units per 15 carbs    [provider]  Insulin Glargine (LANTUS SOLOSTAR) 100 UNIT/ML Solostar Pen Inject 35 Units into the skin daily at 10 pm. Patient taking differently: Inject 40 Units into the skin daily at 10 pm.  07/15/16   Lonia Blood, MD  Insulin Syringe 27G X 1/2" 0.5 ML MISC 1 Syringe by Does not apply route 3 (three) times daily with meals. 07/15/16   Lonia Blood, MD    Family History Family History  Problem Relation Age of Onset  . Coronary artery disease Mother   . Lung cancer Mother   . Heart attack Father 3  . Thyroid disease Maternal  Grandmother   . Diabetes type I Daughter     Social History Social History  Substance Use Topics  . Smoking status: Current Every Day Smoker    Packs/day: 1.00    Years: 15.00    Types: Cigarettes  . Smokeless tobacco: Never Used     Comment: She is currently trying to quit.  . Alcohol use No     Allergies   Penicillins; Codeine; and Tramadol   Review of Systems Review of Systems  Constitutional: Positive for chills and fever. Negative for activity change.  HENT: Positive for sore throat (after vomiting).   Respiratory: Negative for shortness of breath.   Cardiovascular: Negative for chest pain.  Gastrointestinal: Positive for abdominal pain, nausea and vomiting. Negative for constipation and diarrhea.  Endocrine: Positive for polydipsia and  polyuria.  Genitourinary: Positive for dysuria, flank pain, frequency and urgency. Negative for hematuria, pelvic pain, vaginal bleeding, vaginal discharge and vaginal pain.  Musculoskeletal: Positive for back pain (flank).  Neurological: Negative for dizziness and headaches.    Physical Exam Updated Vital Signs BP (!) 151/85   Pulse 100   Temp 97.8 F (36.6 C) (Oral)   Resp (!) 22   Wt 47.3 kg (104 lb 4 oz)   SpO2 100%   BMI 20.36 kg/m   Physical Exam  Constitutional: She is oriented to person, place, and time. She appears well-developed. No distress.  Thin female  HENT:  Head: Normocephalic and atraumatic.  Mouth/Throat: Uvula is midline.  MM slightly dry. Mild erythema of o/p  Eyes: EOM are normal. Pupils are equal, round, and reactive to light.  Neck: Normal range of motion. Neck supple.  Cardiovascular: Normal rate, regular rhythm, normal heart sounds and intact distal pulses.   No murmur heard. Pulmonary/Chest: Effort normal and breath sounds normal. No respiratory distress. She has no wheezes.  Abdominal: Soft. Bowel sounds are normal. She exhibits no distension and no mass. There is tenderness (suprapubic). There is no guarding.  Musculoskeletal: Normal range of motion. She exhibits no edema.  + bilateral CVA TTP  Neurological: She is alert and oriented to person, place, and time.  Skin: Skin is warm and dry. Capillary refill takes less than 2 seconds. She is not diaphoretic.     Psychiatric: She has a normal mood and affect. Her behavior is normal. Judgment and thought content normal.  Nursing note and vitals reviewed.   ED Treatments / Results  Labs (all labs ordered are listed, but only abnormal results are displayed) Labs Reviewed  BASIC METABOLIC PANEL - Abnormal; Notable for the following:       Result Value   Sodium 133 (*)    Potassium 3.4 (*)    Glucose, Bld 276 (*)    Calcium 8.5 (*)    All other components within normal limits  URINALYSIS,  ROUTINE W REFLEX MICROSCOPIC - Abnormal; Notable for the following:    APPearance HAZY (*)    Specific Gravity, Urine 1.036 (*)    Glucose, UA >=500 (*)    Ketones, ur 5 (*)    Protein, ur 100 (*)    Leukocytes, UA MODERATE (*)    Bacteria, UA FEW (*)    Squamous Epithelial / LPF 0-5 (*)    All other components within normal limits  CBG MONITORING, ED - Abnormal; Notable for the following:    Glucose-Capillary 280 (*)    All other components within normal limits  CBG MONITORING, ED - Abnormal; Notable for the following:  Glucose-Capillary 290 (*)    All other components within normal limits  URINE CULTURE  CBC  PREGNANCY, URINE    EKG  EKG Interpretation None       Radiology No results found.  Procedures Procedures (including critical care time)  Medications Ordered in ED Medications  sodium chloride 0.9 % bolus 1,000 mL (not administered)  ondansetron (ZOFRAN-ODT) disintegrating tablet 4 mg (not administered)    Initial Impression / Assessment and Plan / ED Course  I have reviewed the triage vital signs and the nursing notes.  Pertinent labs & imaging results that were available during my care of the patient were reviewed by me and considered in my medical decision making (see chart for details).    1357: CBC, BMP, UA, Ucx, Upreg, 1L NS, Zofran ODT ordered.  History is suspicious for UTI vs pyelonephritis, which is likely the cause of her elevated BGs.  Will evaluate infection and signs of DKA.  BG 280 here in ED.  09/2016 Ucx reviewed, which grew Cipro sensitive E Coli and Strep Agalactiae.    1414: Upreg negative. UA w/ few bacteria, moderate LE, too numerous to count WBCs, >500 glucose, SG 1.036.  Patient has PCN allergy (anaphylaxis).  No record of Cephalosporin use, so Cipro 500mg  PO ordered.  CBC within normal limits, no leukocytosis.  1430: Patient updated with lab results/ plan.  She reports improvement in nausea after Zofran.  She is in NAD.  1L NS bolus  being hung during evaluation.  BMP with corrected Na 136.  Normal renal function. No anion gap.  K 3.4. KDur 40 mEQ PO x1 ordered.  Final Clinical Impressions(s) / ED Diagnoses   Final diagnoses:  Pyelonephritis  Hyperglycemia   [name] is a 31 y.o. female that presented to the ED with symptoms of UTI and hyperglycemia.  Her UA supported UTI.  She was afebrile here and had a normal WBC.  She is being treated as pyelonephritis, given report of vomiting and fevers at home.  Her BMP revealed normal renal function and no anion gap (so not in DKA).  Her potassium was slightly low at 3.4.  This was repleted in ED and was likely secondary to recent vomiting.  She was given 1L NS.  Her BG remained stable in ED at 276.  She can resume home doses of insulin.  CBGs should improve as infection resolves.  She was discharged on Cipro 500mg  BID x7d.  Urine cultures pending at discharge.  Reviewed that she would be called should culture grow a bacteria resistant to current therapy.  Reasons for return reviewed.  Discharge instructions reviewed.  List of local PCPs provided in discharge paperwork.  Encouraged patient to establish with new PCP soon for continued outpatient healthcare needs.  New Prescriptions New Prescriptions   CIPROFLOXACIN (CIPRO) 500 MG TABLET    Take 1 tablet (500 mg total) by mouth 2 (two) times daily. Take first dose tonight (04/01/17).   ONDANSETRON (ZOFRAN ODT) 4 MG DISINTEGRATING TABLET    Take 1 tablet (4 mg total) by mouth every 8 (eight) hours as needed for nausea or vomiting.   PHENAZOPYRIDINE (PYRIDIUM) 100 MG TABLET    Take 1 tablet (100 mg total) by mouth 3 (three) times daily as needed for pain.     Raliegh Ip, DO 04/01/17 1446    Raliegh Ip, DO 04/01/17 1447    Blane Ohara, MD 04/01/17 [phone]

## 2017-04-01 NOTE — ED Notes (Signed)
cbg 290 

## 2017-04-01 NOTE — Discharge Instructions (Signed)
You were seen in the ED for UTI.  Your white blood count was normal.  Your urinalysis did show bacteria and evidence of infection.  You were started on Ciprofloxacin for this infection.  You will need to take this twice daily.  Take your next dose before bed tonight.  You had a urine culture collected.  If this shows that you need a different antibiotic, you will be called.  You were given fluids through an IV.  You are being discharged with Zofran to use as needed as directed for nausea.  If you are unable to stay hydrated, you vomit blood, you have continued fevers after 2 days on antibiotics, please seek medical evaluation.

## 2017-04-01 NOTE — ED Triage Notes (Signed)
PResents with fever since Wednesday, increased CBGs, and dysuria. Fever of 101 reportsed at home. Today began feeling worse, took tylenol at 11:30 this am for fever and pain. Endorses nausea and vomiting, denies diarrhea. Unable to hold fluids down. Sugars at home running in 400s, topok insulin before coming. Here CBG 280.

## 2017-04-01 NOTE — ED Triage Notes (Signed)
Pt reports fever for 2 days Also high blood sugars  Her physician has died will need physician referral

## 2017-04-03 LAB — URINE CULTURE: Culture: >=100000 — AB

## 2017-04-04 ENCOUNTER — Telehealth: Payer: Self-pay | Admitting: *Deleted

## 2017-04-04 NOTE — Telephone Encounter (Signed)
Post ED Visit - Positive Culture Follow-up: Unsuccessful Patient Follow-up  Culture assessed and recommendations reviewed by:  [x]  Enzo Bi, Pharm.D. []  Celedonio Miyamoto, Pharm.D., BCPS AQ-ID []  Garvin Fila, Pharm.D., BCPS []  Georgina Pillion, Pharm.D., BCPS []  [place], [place].D., BCPS, AAHIVP []  Estella Husk, Pharm.D., BCPS, AAHIVP []  Lysle Pearl, PharmD, BCPS []  Casilda Carls, PharmD, BCPS []  Pollyann Samples, PharmD, BCPS  Positive urine culture, chart reviewed by [place] Russo, PA-C  []  Patient discharged without antimicrobial prescription and treatment is now indicated []  Organism is resistant to prescribed ED discharge antimicrobial []  Patient with positive blood cultures   Unable to contact patient after 3 attempts, letter will be sent to address on file  Lysle Pearl 04/04/2017, 9:33 AM

## 2017-04-04 NOTE — Progress Notes (Signed)
ED Antimicrobial Stewardship Positive Culture Follow Up   [name] is an 31 y.o. female who presented to [facility] on 04/01/2017 with a chief complaint of  Chief Complaint  Patient presents with  . Hyperglycemia  . Fever    Recent Results (from the past 720 hour(s))  Urine culture     Status: Abnormal   Collection Time: 04/01/17  1:31 PM  Result Value Ref Range Status   Specimen Description URINE, CLEAN CATCH  Final   Special Requests NONE  Final   Culture (A)  Final    >=100,000 COLONIES/mL GROUP B STREP(S.AGALACTIAE)ISOLATED TESTING AGAINST S. AGALACTIAE NOT ROUTINELY PERFORMED DUE TO PREDICTABILITY OF AMP/PEN/VAN SUSCEPTIBILITY. Performed at [facility], [address] [place], [place] [address]    Report Status 04/03/2017 FINAL  Final    Presented with fevers and dysuria. Also was positive for flank pain. Treated with cipro and cx grew Group B strep. Most likely will cover but willl call patient and make sure symptoms improving.  If not improving can switch cipro to Bactrim 1 DS tablet PO BID x 14 days  ED Provider: [place] Russo PA-C   Armandina Stammer 04/04/2017, 9:15 AM Infectious Diseases Pharmacist Phone# [phone]

## 2017-04-10 ENCOUNTER — Encounter (HOSPITAL_COMMUNITY): Payer: Self-pay | Admitting: *Deleted

## 2017-04-10 ENCOUNTER — Emergency Department (HOSPITAL_COMMUNITY): Payer: Medicaid Other

## 2017-04-10 ENCOUNTER — Emergency Department (HOSPITAL_COMMUNITY)
Admission: EM | Admit: 2017-04-10 | Discharge: 2017-04-10 | Disposition: A | Discharge status: Home / Self Care | Payer: Medicaid Other | Attending: Emergency Medicine | Admitting: Emergency Medicine

## 2017-04-10 DIAGNOSIS — F17210 Nicotine dependence, cigarettes, uncomplicated: Secondary | ICD-10-CM | POA: Insufficient documentation

## 2017-04-10 DIAGNOSIS — E109 Type 1 diabetes mellitus without complications: Secondary | ICD-10-CM | POA: Diagnosis not present

## 2017-04-10 DIAGNOSIS — R1031 Right lower quadrant pain: Secondary | ICD-10-CM | POA: Diagnosis present

## 2017-04-10 DIAGNOSIS — Z79899 Other long term (current) drug therapy: Secondary | ICD-10-CM | POA: Diagnosis not present

## 2017-04-10 DIAGNOSIS — R1030 Lower abdominal pain, unspecified: Secondary | ICD-10-CM

## 2017-04-10 LAB — URINALYSIS, ROUTINE W REFLEX MICROSCOPIC
BILIRUBIN URINE: NEGATIVE
Glucose, UA: >=500 mg/dL — AB
Hgb urine dipstick: NEGATIVE
Ketones, ur: NEGATIVE mg/dL
Nitrite: POSITIVE — AB
PROTEIN: NEGATIVE mg/dL
SPECIFIC GRAVITY, URINE: 1.028 (ref 1.005–1.030)
pH: 6 (ref 5.0–8.0)

## 2017-04-10 LAB — COMPREHENSIVE METABOLIC PANEL
ALT: 13 U/L — AB (ref 14–54)
AST: 18 U/L (ref 15–41)
Albumin: 4 g/dL (ref 3.5–5.0)
Alkaline Phosphatase: 62 U/L (ref 38–126)
Anion gap: 8 (ref 5–15)
BUN: 18 mg/dL (ref 6–20)
CO2: 29 mmol/L (ref 22–32)
CREATININE: 0.89 mg/dL (ref 0.44–1.00)
Calcium: 9.2 mg/dL (ref 8.9–10.3)
Chloride: 95 mmol/L — ABNORMAL LOW (ref 101–111)
GFR calc Af Amer: >60 mL/min (ref >60)
GFR calc non Af Amer: >60 mL/min (ref >60)
GLUCOSE: 395 mg/dL — AB (ref 65–99)
Potassium: 4.3 mmol/L (ref 3.5–5.1)
SODIUM: 132 mmol/L — AB (ref 135–145)
Total Bilirubin: 0.4 mg/dL (ref 0.3–1.2)
Total Protein: 7.6 g/dL (ref 6.5–8.1)

## 2017-04-10 LAB — CBC
HCT: 42 % (ref 36.0–46.0)
Hemoglobin: 14.1 g/dL (ref 12.0–15.0)
MCH: 28.8 pg (ref 26.0–34.0)
MCHC: 33.6 g/dL (ref 30.0–36.0)
MCV: 85.9 fL (ref 78.0–100.0)
PLATELETS: 278 10*3/uL (ref 150–400)
RBC: 4.89 MIL/uL (ref 3.87–5.11)
RDW: 14.4 % (ref 11.5–15.5)
WBC: 9.6 10*3/uL (ref 4.0–10.5)

## 2017-04-10 LAB — I-STAT BETA HCG BLOOD, ED (MC, WL, AP ONLY)

## 2017-04-10 LAB — LIPASE, BLOOD: LIPASE: 24 U/L (ref 11–51)

## 2017-04-10 MED ORDER — SODIUM CHLORIDE 0.9 % IV BOLUS (SEPSIS)
1000.0000 mL | Freq: Once | INTRAVENOUS | Status: AC
  Administered 2017-04-10: 1000 mL via INTRAVENOUS

## 2017-04-10 MED ORDER — IBUPROFEN 800 MG PO TABS: 800.0000 mg | ORAL_TABLET | Freq: Three times a day (TID) | ORAL | 0 refills | Status: DC

## 2017-04-10 MED ORDER — HYDROMORPHONE HCL 1 MG/ML IJ SOLN
0.5000 mg | Freq: Once | INTRAMUSCULAR | Status: AC
  Administered 2017-04-10: 0.5 mg via INTRAVENOUS
  Filled 2017-04-10: qty 1

## 2017-04-10 MED ORDER — IOPAMIDOL (ISOVUE-300) INJECTION 61%
INTRAVENOUS | Status: AC
  Administered 2017-04-10: 30 mL
  Filled 2017-04-10: qty 30

## 2017-04-10 MED ORDER — IOPAMIDOL (ISOVUE-300) INJECTION 61%
100.0000 mL | Freq: Once | INTRAVENOUS | Status: AC | PRN
  Administered 2017-04-10: 100 mL via INTRAVENOUS

## 2017-04-10 MED ORDER — ONDANSETRON HCL 4 MG/2ML IJ SOLN
4.0000 mg | Freq: Once | INTRAMUSCULAR | Status: AC
  Administered 2017-04-10: 4 mg via INTRAVENOUS
  Filled 2017-04-10: qty 2

## 2017-04-10 NOTE — ED Notes (Signed)
Pt ambulated to bathroom 

## 2017-04-10 NOTE — ED Notes (Signed)
Ct called and alerted this nurse that they are running 20 mins behind. Will be here to pick pt up closer to 1500

## 2017-04-10 NOTE — ED Notes (Signed)
Pt to CT

## 2017-04-10 NOTE — Discharge Instructions (Signed)
Follow up with dr. Ouida Sills or at the [facility] clinic next week for recheck

## 2017-04-10 NOTE — ED Triage Notes (Signed)
Abdominal pain with vomiting 

## 2017-04-12 LAB — URINE CULTURE

## 2017-04-14 NOTE — ED Provider Notes (Signed)
[facility] Provider Note   CSN: [id number] Arrival date & time: 04/10/17  9562     History   Chief Complaint Chief Complaint  Patient presents with  . Abdominal Pain    HPI [name] is a 31 y.o. female.  Patient complains of lower abdominal pain.   The history is provided by the patient. No language interpreter was used.  Abdominal Pain   This is a new problem. The current episode started 2 days ago. The problem occurs rarely. The problem has not changed since onset.The pain is associated with an unknown factor. The pain is located in the suprapubic region. Pertinent negatives include diarrhea, frequency, hematuria and headaches.    Past Medical History:  Diagnosis Date  . Diabetes mellitus   . DKA 05/01/2011    Patient Active Problem List   Diagnosis Date Noted  . UTI (lower urinary tract infection) 07/14/2016  . Panic disorder without agoraphobia with moderate panic attacks 01/20/2016  . Benzodiazepine abuse 01/20/2016  . Substance induced mood disorder (HCC) 01/19/2016  . Diabetes mellitus type 1, uncontrolled (HCC) 05/01/2011  . Leukocytosis 05/01/2011    Past Surgical History:  Procedure Laterality Date  . TUBAL LIGATION    . WOUND EXPLORATION Left 07/26/2014   Procedure: Irrigation, debridement and exploration of elbow wounds;  Surgeon: Betha Loa, MD;  Location: [facility] OR;  Service: Orthopedics;  Laterality: Left;    OB History    Gravida Para Term Preterm AB Living   2 2           SAB TAB Ectopic Multiple Live Births                   Home Medications    Prior to Admission medications   Medication Sig Start Date End Date Taking? Authorizing Provider  acetaminophen (TYLENOL) 500 MG tablet Take 1,500 mg by mouth every 6 (six) hours as needed for mild pain or moderate pain.    Yes [provider]  insulin aspart (NOVOLOG FLEXPEN) 100 UNIT/ML FlexPen Inject 2-15 Units into the skin 3 (three) times daily with meals. Per sliding  scale 2units per 15 carbs    Yes [provider]  Insulin Glargine (LANTUS SOLOSTAR) 100 UNIT/ML Solostar Pen Inject 35 Units into the skin daily at 10 pm. Patient taking differently: Inject 40 Units into the skin daily at 10 pm.  07/15/16  Yes Lonia Blood, MD  Insulin Syringe 27G X 1/2" 0.5 ML MISC 1 Syringe by Does not apply route 3 (three) times daily with meals. 07/15/16  Yes Lonia Blood, MD  ondansetron (ZOFRAN ODT) 4 MG disintegrating tablet Take 1 tablet (4 mg total) by mouth every 8 (eight) hours as needed for nausea or vomiting. 04/01/17  Yes Nadine Counts, Ashly M, DO  ibuprofen (ADVIL,MOTRIN) 800 MG tablet Take 1 tablet (800 mg total) by mouth 3 (three) times daily. 04/10/17   Bethann Berkshire, MD  phenazopyridine (PYRIDIUM) 100 MG tablet Take 1 tablet (100 mg total) by mouth 3 (three) times daily as needed for pain. Patient not taking: Reported on 04/10/2017 04/01/17 04/01/18  Raliegh Ip, DO    Family History Family History  Problem Relation Age of Onset  . Coronary artery disease Mother   . Lung cancer Mother   . Heart attack Father 68  . Thyroid disease Maternal Grandmother   . Diabetes type I Daughter     Social History Social History  Substance Use Topics  . Smoking status: Current Every  Day Smoker    Packs/day: 1.00    Years: 15.00    Types: Cigarettes  . Smokeless tobacco: Never Used     Comment: She is currently trying to quit.  . Alcohol use No     Allergies   Penicillins; Codeine; and Tramadol   Review of Systems Review of Systems  Constitutional: Negative for appetite change and fatigue.  HENT: Negative for congestion, ear discharge and sinus pressure.   Eyes: Negative for discharge.  Respiratory: Negative for cough.   Cardiovascular: Negative for chest pain.  Gastrointestinal: Positive for abdominal pain. Negative for diarrhea.  Genitourinary: Negative for frequency and hematuria.  Musculoskeletal: Negative for back pain.    Skin: Negative for rash.  Neurological: Negative for seizures and headaches.  Psychiatric/Behavioral: Negative for hallucinations.     Physical Exam Updated Vital Signs BP 106/66 (BP Location: Right Arm)   Pulse 66   Temp 98.2 F (36.8 C)   Resp 16   Ht 5\' 1"  (1.549 m)   Wt 47.2 kg (104 lb)   LMP 04/05/2017 Comment: neg hcg  SpO2 95%   BMI 19.65 kg/m   Physical Exam  Constitutional: She is oriented to person, place, and time. She appears well-developed.  HENT:  Head: Normocephalic.  Eyes: Conjunctivae and EOM are normal. No scleral icterus.  Neck: Neck supple. No thyromegaly present.  Cardiovascular: Normal rate and regular rhythm.  Exam reveals no gallop and no friction rub.   No murmur heard. Pulmonary/Chest: No stridor. She has no wheezes. She has no rales. She exhibits no tenderness.  Abdominal: She exhibits no distension. There is tenderness. There is no rebound.  Mild suprapubic and right lower quadrant tenderness  Musculoskeletal: Normal range of motion. She exhibits no edema.  Lymphadenopathy:    She has no cervical adenopathy.  Neurological: She is oriented to person, place, and time. She exhibits normal muscle tone. Coordination normal.  Skin: No rash noted. No erythema.  Psychiatric: She has a normal mood and affect. Her behavior is normal.     ED Treatments / Results  Labs (all labs ordered are listed, but only abnormal results are displayed) Labs Reviewed  URINE CULTURE - Abnormal; Notable for the following:       Result Value   Culture MULTIPLE SPECIES PRESENT, SUGGEST RECOLLECTION (*)    All other components within normal limits  COMPREHENSIVE METABOLIC PANEL - Abnormal; Notable for the following:    Sodium 132 (*)    Chloride 95 (*)    Glucose, Bld 395 (*)    ALT 13 (*)    All other components within normal limits  URINALYSIS, ROUTINE W REFLEX MICROSCOPIC - Abnormal; Notable for the following:    Color, Urine AMBER (*)    Glucose, UA >=500 (*)     Nitrite POSITIVE (*)    Leukocytes, UA TRACE (*)    Bacteria, UA RARE (*)    Squamous Epithelial / LPF 0-5 (*)    Crystals PRESENT (*)    All other components within normal limits  LIPASE, BLOOD  CBC  I-STAT BETA HCG BLOOD, ED ([facility], [facility], [facility] ONLY)    EKG  EKG Interpretation None       Radiology No results found.  Procedures Procedures (including critical care time)  Medications Ordered in ED Medications  HYDROmorphone (DILAUDID) injection 0.5 mg (0.5 mg Intravenous Given 04/10/17 1130)  ondansetron (ZOFRAN) injection 4 mg (4 mg Intravenous Given 04/10/17 1129)  sodium chloride 0.9 % bolus 1,000 mL (0 mLs  Intravenous Stopped 04/10/17 1346)  iopamidol (ISOVUE-300) 61 % injection (30 mLs  Contrast Given 04/10/17 1519)  iopamidol (ISOVUE-300) 61 % injection 100 mL (100 mLs Intravenous Contrast Given 04/10/17 1519)  sodium chloride 0.9 % bolus 1,000 mL (0 mLs Intravenous Stopped 04/10/17 1628)     Initial Impression / Assessment and Plan / ED Course  I have reviewed the triage vital signs and the nursing notes.  Pertinent labs & imaging results that were available during my care of the patient were reviewed by me and considered in my medical decision making (see chart for details).     CT scan unremarkable. A urine culture will be sent. Patient will follow-up with PCP and placed on Motrin  Final Clinical Impressions(s) / ED Diagnoses   Final diagnoses:  Lower abdominal pain    New Prescriptions Discharge Medication List as of 04/10/2017  3:57 PM    START taking these medications   Details  ibuprofen (ADVIL,MOTRIN) 800 MG tablet Take 1 tablet (800 mg total) by mouth 3 (three) times daily., Starting Mon 04/10/2017, Print         Bethann Berkshire, MD 04/14/17 1038

## 2017-05-18 ENCOUNTER — Emergency Department (HOSPITAL_COMMUNITY): Payer: Self-pay

## 2017-05-18 ENCOUNTER — Encounter (HOSPITAL_COMMUNITY): Payer: Self-pay | Admitting: *Deleted

## 2017-05-18 ENCOUNTER — Emergency Department (HOSPITAL_COMMUNITY)
Admission: EM | Admit: 2017-05-18 | Discharge: 2017-05-18 | Discharge status: Against Medical Advice | Payer: Self-pay | Attending: Emergency Medicine | Admitting: Emergency Medicine

## 2017-05-18 DIAGNOSIS — Z794 Long term (current) use of insulin: Secondary | ICD-10-CM | POA: Insufficient documentation

## 2017-05-18 DIAGNOSIS — F17210 Nicotine dependence, cigarettes, uncomplicated: Secondary | ICD-10-CM | POA: Insufficient documentation

## 2017-05-18 DIAGNOSIS — R739 Hyperglycemia, unspecified: Secondary | ICD-10-CM

## 2017-05-18 DIAGNOSIS — Z5320 Procedure and treatment not carried out because of patient's decision for unspecified reasons: Secondary | ICD-10-CM | POA: Insufficient documentation

## 2017-05-18 DIAGNOSIS — R079 Chest pain, unspecified: Secondary | ICD-10-CM | POA: Insufficient documentation

## 2017-05-18 DIAGNOSIS — E1065 Type 1 diabetes mellitus with hyperglycemia: Secondary | ICD-10-CM | POA: Insufficient documentation

## 2017-05-18 DIAGNOSIS — F1920 Other psychoactive substance dependence, uncomplicated: Secondary | ICD-10-CM | POA: Insufficient documentation

## 2017-05-18 DIAGNOSIS — N390 Urinary tract infection, site not specified: Secondary | ICD-10-CM | POA: Insufficient documentation

## 2017-05-18 DIAGNOSIS — F1910 Other psychoactive substance abuse, uncomplicated: Secondary | ICD-10-CM

## 2017-05-18 HISTORY — DX: Other psychoactive substance abuse, uncomplicated: F19.10

## 2017-05-18 LAB — URINALYSIS, ROUTINE W REFLEX MICROSCOPIC
BILIRUBIN URINE: NEGATIVE
KETONES UR: 5 mg/dL — AB
Nitrite: NEGATIVE
PROTEIN: 30 mg/dL — AB
Specific Gravity, Urine: 1.036 — ABNORMAL HIGH (ref 1.005–1.030)
pH: 5 (ref 5.0–8.0)

## 2017-05-18 LAB — CBC WITH DIFFERENTIAL/PLATELET
BASOS ABS: 0 10*3/uL (ref 0.0–0.1)
BASOS PCT: 0 %
EOS ABS: 0 10*3/uL (ref 0.0–0.7)
EOS PCT: 0 %
HCT: 40.1 % (ref 36.0–46.0)
Hemoglobin: 13.9 g/dL (ref 12.0–15.0)
Lymphocytes Relative: 28 %
Lymphs Abs: 2.9 10*3/uL (ref 0.7–4.0)
MCH: 29.3 pg (ref 26.0–34.0)
MCHC: 34.7 g/dL (ref 30.0–36.0)
MCV: 84.6 fL (ref 78.0–100.0)
Monocytes Absolute: 1.5 10*3/uL — ABNORMAL HIGH (ref 0.1–1.0)
Monocytes Relative: 14 %
Neutro Abs: 5.9 10*3/uL (ref 1.7–7.7)
Neutrophils Relative %: 58 %
PLATELETS: 252 10*3/uL (ref 150–400)
RBC: 4.74 MIL/uL (ref 3.87–5.11)
RDW: 14 % (ref 11.5–15.5)
WBC: 10.3 10*3/uL (ref 4.0–10.5)

## 2017-05-18 LAB — COMPREHENSIVE METABOLIC PANEL
ALK PHOS: 64 U/L (ref 38–126)
ALT: 14 U/L (ref 14–54)
AST: 18 U/L (ref 15–41)
Albumin: 4.4 g/dL (ref 3.5–5.0)
Anion gap: 13 (ref 5–15)
BUN: 27 mg/dL — ABNORMAL HIGH (ref 6–20)
CHLORIDE: 97 mmol/L — AB (ref 101–111)
CO2: 25 mmol/L (ref 22–32)
CREATININE: 0.87 mg/dL (ref 0.44–1.00)
Calcium: 10.1 mg/dL (ref 8.9–10.3)
Glucose, Bld: 499 mg/dL — ABNORMAL HIGH (ref 65–99)
Potassium: 4.1 mmol/L (ref 3.5–5.1)
Sodium: 135 mmol/L (ref 135–145)
TOTAL PROTEIN: 8.2 g/dL — AB (ref 6.5–8.1)
Total Bilirubin: 0.8 mg/dL (ref 0.3–1.2)

## 2017-05-18 LAB — RAPID URINE DRUG SCREEN, HOSP PERFORMED
Amphetamines: NOT DETECTED
BENZODIAZEPINES: NOT DETECTED
Barbiturates: NOT DETECTED
COCAINE: POSITIVE — AB
Opiates: NOT DETECTED
Tetrahydrocannabinol: POSITIVE — AB

## 2017-05-18 LAB — I-STAT BETA HCG BLOOD, ED (MC, WL, AP ONLY): I-stat hCG, quantitative: <5 m[IU]/mL (ref <5)

## 2017-05-18 LAB — LIPASE, BLOOD: LIPASE: 17 U/L (ref 11–51)

## 2017-05-18 LAB — CBG MONITORING, ED: GLUCOSE-CAPILLARY: 485 mg/dL — AB (ref 65–99)

## 2017-05-18 LAB — I-STAT CG4 LACTIC ACID, ED: LACTIC ACID, VENOUS: 2.58 mmol/L — AB (ref 0.5–1.9)

## 2017-05-18 MED ORDER — PROMETHAZINE HCL 25 MG/ML IJ SOLN
12.5000 mg | Freq: Once | INTRAMUSCULAR | Status: AC
  Administered 2017-05-18: 12.5 mg via INTRAVENOUS
  Filled 2017-05-18: qty 1

## 2017-05-18 MED ORDER — IOPAMIDOL (ISOVUE-370) INJECTION 76%
75.0000 mL | Freq: Once | INTRAVENOUS | Status: AC | PRN
  Administered 2017-05-18: 100 mL via INTRAVENOUS

## 2017-05-18 MED ORDER — SODIUM CHLORIDE 0.9 % IV SOLN: INTRAVENOUS | Status: DC

## 2017-05-18 MED ORDER — INSULIN ASPART 100 UNIT/ML ~~LOC~~ SOLN
10.0000 [IU] | Freq: Once | SUBCUTANEOUS | Status: AC
  Administered 2017-05-18: 10 [IU] via SUBCUTANEOUS
  Filled 2017-05-18: qty 1

## 2017-05-18 MED ORDER — SODIUM CHLORIDE 0.9 % IV BOLUS (SEPSIS)
1000.0000 mL | Freq: Once | INTRAVENOUS | Status: AC
  Administered 2017-05-18: 1000 mL via INTRAVENOUS

## 2017-05-18 MED ORDER — ACETAMINOPHEN 325 MG PO TABS
650.0000 mg | ORAL_TABLET | Freq: Once | ORAL | Status: AC
  Administered 2017-05-18: 650 mg via ORAL
  Filled 2017-05-18: qty 2

## 2017-05-18 NOTE — ED Notes (Signed)
RN called pt at request of Dr. Ethelda Chick to inform her she is positive for UTI and ask her to return, offer to call in prescription or instruct her to f/u with her doctor. Non descripive message left for pt to return call.

## 2017-05-18 NOTE — ED Triage Notes (Signed)
CALLED FOR ROOM, OUTSIDE SMOKING 

## 2017-05-18 NOTE — ED Provider Notes (Addendum)
4:40 PM Patient eloped from the emergency department without notifying staff. I did not get to interview her. Charge nurse reports that she was of sound mind to make decision. Dr.Mcmannus's note reports she oriented 3 and alert with clear speech. I've asked the charge nurse to call her to return to the emergency department    Doug Sou, MD 05/18/17 1655    Doug Sou, MD 05/18/17 [phone]

## 2017-05-18 NOTE — ED Notes (Signed)
Verified insulin with Sue Lush RN-10Units

## 2017-05-18 NOTE — ED Triage Notes (Signed)
PATIETN WAS OUTSIDE SMOKING PRIOR TO TRIAGE

## 2017-05-18 NOTE — ED Provider Notes (Addendum)
[facility]-EMERG[facility]Y DEPT Provider Note   CSN: [id number] Arrival date & time: 05/18/17  1122     History   Chief Complaint Chief Complaint  Patient presents with  . Fever    HPI [name] is a 31 y.o. female.  HPI  Pt was seen at 1355. Per pt, c/o gradual onset and persistence of constant multiple complaints for the past 3 days. Complaints include: thoracic back pain which worsens with cough and deep breath, SOB/cough, "fast HR," runny/stuffy nose, N/V, CBG's in the "300's," poor PO intake and home fevers to "101." Denies abd pain, no diarrhea, no CP/palpitations, no dysuria/hematuria, no black or blood in stools or emesis, no rash.    Past Medical History:  Diagnosis Date  . Diabetes mellitus   . DKA 05/01/2011  . Polysubstance abuse     Patient Active Problem List   Diagnosis Date Noted  . UTI (lower urinary tract infection) 07/14/2016  . Panic disorder without agoraphobia with moderate panic attacks 01/20/2016  . Benzodiazepine abuse 01/20/2016  . Substance induced mood disorder (HCC) 01/19/2016  . Diabetes mellitus type 1, uncontrolled (HCC) 05/01/2011  . Leukocytosis 05/01/2011    Past Surgical History:  Procedure Laterality Date  . TUBAL LIGATION    . WOUND EXPLORATION Left 07/26/2014   Procedure: Irrigation, debridement and exploration of elbow wounds;  Surgeon: Betha Loa, MD;  Location: [facility] OR;  Service: Orthopedics;  Laterality: Left;    OB History    Gravida Para Term Preterm AB Living   2 2           SAB TAB Ectopic Multiple Live Births                   Home Medications    Prior to Admission medications   Medication Sig Start Date End Date Taking? Authorizing Provider  acetaminophen (TYLENOL) 500 MG tablet Take 1,500 mg by mouth every 6 (six) hours as needed for mild pain or moderate pain.    Yes [provider]  insulin aspart (NOVOLOG FLEXPEN) 100 UNIT/ML FlexPen Inject 2-15 Units into the skin 3 (three) times daily with meals. Per  sliding scale 2units per 15 carbs    Yes [provider]  Insulin Glargine (LANTUS SOLOSTAR) 100 UNIT/ML Solostar Pen Inject 35 Units into the skin daily at 10 pm. Patient taking differently: Inject 40 Units into the skin daily at 10 pm.  07/15/16  Yes Lonia Blood, MD  Pseudoeph-Doxylamine-DM-APAP (DAYQUIL/NYQUIL COLD/FLU RELIEF PO) Take 2 tablets by mouth every 6 (six) hours.   Yes [provider]    Family History Family History  Problem Relation Age of Onset  . Coronary artery disease Mother   . Lung cancer Mother   . Heart attack Father 40  . Thyroid disease Maternal Grandmother   . Diabetes type I Daughter     Social History Social History  Substance Use Topics  . Smoking status: Current Every Day Smoker    Packs/day: 1.00    Years: 15.00    Types: Cigarettes  . Smokeless tobacco: Never Used     Comment: She is currently trying to quit.  . Alcohol use No     Allergies   Penicillins; Codeine; and Tramadol   Review of Systems Review of Systems ROS: Statement: All systems negative except as marked or noted in the HPI; Constitutional: +fever and chills. ; ; Eyes: Negative for eye pain, redness and discharge. ; ; ENMT: Negative for ear pain, hoarseness, nasal congestion,  sinus pressure and sore throat. ; ; Cardiovascular: +"fast HR." Negative for chest pain, palpitations, diaphoresis, and peripheral edema. ; ; Respiratory: +SOB, cough. Negative for wheezing and stridor. ; ; Gastrointestinal: +N/V. Negative for diarrhea, abdominal pain, blood in stool, hematemesis, jaundice and rectal bleeding. . ; ; Genitourinary: Negative for dysuria, flank pain and hematuria. ; ; Musculoskeletal: +thoracic back pain. Negative for neck pain. Negative for swelling and trauma.; ; Skin: Negative for pruritus, rash, abrasions, blisters, bruising and skin lesion.; ; Neuro: Negative for headache, lightheadedness and neck stiffness. Negative for weakness, altered level of  consciousness, altered mental status, extremity weakness, paresthesias, involuntary movement, seizure and syncope.       Physical Exam Updated Vital Signs BP 119/83 (BP Location: Left Arm)   Pulse 77   Temp 98 F (36.7 C) (Oral)   Resp 20   Ht 5\' 1"  (1.549 m)   Wt 43.4 kg (95 lb 9.6 oz)   LMP 04/30/2017 (LMP Unknown)   SpO2 98%   BMI 18.06 kg/m    Patient Vitals for the past 24 hrs:  BP Temp Temp src Pulse Resp SpO2 Height Weight  05/18/17 1422 119/83 98 F (36.7 C) Oral 77 20 98 % - -  05/18/17 1137 - - - - - - 5\' 1"  (1.549 m) 43.4 kg (95 lb 9.6 oz)  05/18/17 1136 109/71 98 F (36.7 C) Oral (!) 121 20 99 % - -      Physical Exam 1400: Physical examination:  Nursing notes reviewed; Vital signs and O2 SAT reviewed;  Constitutional: Well developed, Well nourished, Well hydrated, In no acute distress; Head:  Normocephalic, atraumatic; Eyes: EOMI, PERRL, No scleral icterus; ENMT: TM's clear bilat. +edemetous nasal turbinates bilat with clear rhinorrhea. Mouth and pharynx without lesions. No tonsillar exudates. No intra-oral edema. No submandibular or sublingual edema. No hoarse voice, no drooling, no stridor. No pain with manipulation of larynx. No trismus.  Mouth and pharynx normal, Mucous membranes moist; Neck: Supple, Full range of motion, No lymphadenopathy; Cardiovascular: Tachycardic rate and rhythm, No gallop; Respiratory: Breath sounds clear & equal bilaterally, No wheezes.  Speaking full sentences with ease, Normal respiratory effort/excursion; Chest: Nontender, Movement normal; Abdomen: Soft, Nontender, Nondistended, Normal bowel sounds; Genitourinary: No CVA tenderness; Spine:  No midline CS, TS, LS tenderness. +TTP bilat thoracic paraspinal muscles.;; Extremities: Pulses normal, No tenderness, No edema, No calf edema or asymmetry.; Neuro: AA&Ox3, Major CN grossly intact.  Speech clear. No gross focal motor or sensory deficits in extremities. Climbs on and off stretcher easily  by herself. Gait steady.; Skin: Color normal, Warm, Dry.; Psych:  Pt is restless on stretcher.    ED Treatments / Results  Labs (all labs ordered are listed, but only abnormal results are displayed)   EKG  EKG Interpretation None       Radiology   Procedures Procedures (including critical care time)  Medications Ordered in ED Medications  sodium chloride 0.9 % bolus 1,000 mL (not administered)  promethazine (PHENERGAN) injection 12.5 mg (not administered)  0.9 %  sodium chloride infusion (not administered)  acetaminophen (TYLENOL) tablet 650 mg (650 mg Oral Given 05/18/17 1413)     Initial Impression / Assessment and Plan / ED Course  I have reviewed the triage vital signs and the nursing notes.  Pertinent labs & imaging results that were available during my care of the patient were reviewed by me and considered in my medical decision making (see chart for details).  MDM Reviewed: previous chart, nursing  note and vitals Reviewed previous: labs Interpretation: labs and CT scan    Results for orders placed or performed during the hospital encounter of 05/18/17  Comprehensive metabolic panel  Result Value Ref Range   Sodium 135 135 - 145 mmol/L   Potassium 4.1 3.5 - 5.1 mmol/L   Chloride 97 (L) 101 - 111 mmol/L   CO2 25 22 - 32 mmol/L   Glucose, Bld 499 (H) 65 - 99 mg/dL   BUN 27 (H) 6 - 20 mg/dL   Creatinine, Ser 1.61 0.44 - 1.00 mg/dL   Calcium 09.6 8.9 - 04.5 mg/dL   Total Protein 8.2 (H) 6.5 - 8.1 g/dL   Albumin 4.4 3.5 - 5.0 g/dL   AST 18 15 - 41 U/L   ALT 14 14 - 54 U/L   Alkaline Phosphatase 64 38 - 126 U/L   Total Bilirubin 0.8 0.3 - 1.2 mg/dL   GFR calc non Af Amer >60 >60 mL/min   GFR calc Af Amer >60 >60 mL/min   Anion gap 13 5 - 15  Lipase, blood  Result Value Ref Range   Lipase 17 11 - 51 U/L  CBC with Differential  Result Value Ref Range   WBC 10.3 4.0 - 10.5 K/uL   RBC 4.74 3.87 - 5.11 MIL/uL   Hemoglobin 13.9 12.0 - 15.0 g/dL   HCT  40.9 81.1 - 91.4 %   MCV 84.6 78.0 - 100.0 fL   MCH 29.3 26.0 - 34.0 pg   MCHC 34.7 30.0 - 36.0 g/dL   RDW 78.2 95.6 - 21.3 %   Platelets 252 150 - 400 K/uL   Neutrophils Relative % 58 %   Neutro Abs 5.9 1.7 - 7.7 K/uL   Lymphocytes Relative 28 %   Lymphs Abs 2.9 0.7 - 4.0 K/uL   Monocytes Relative 14 %   Monocytes Absolute 1.5 (H) 0.1 - 1.0 K/uL   Eosinophils Relative 0 %   Eosinophils Absolute 0.0 0.0 - 0.7 K/uL   Basophils Relative 0 %   Basophils Absolute 0.0 0.0 - 0.1 K/uL  Urine rapid drug screen (hosp performed)  Result Value Ref Range   Opiates NONE DETECTED NONE DETECTED   Cocaine POSITIVE (A) NONE DETECTED   Benzodiazepines NONE DETECTED NONE DETECTED   Amphetamines NONE DETECTED NONE DETECTED   Tetrahydrocannabinol POSITIVE (A) NONE DETECTED   Barbiturates NONE DETECTED NONE DETECTED  Urinalysis, Routine w reflex microscopic  Result Value Ref Range   Color, Urine YELLOW YELLOW   APPearance CLOUDY (A) CLEAR   Specific Gravity, Urine 1.036 (H) 1.005 - 1.030   pH 5.0 5.0 - 8.0   Glucose, UA >=500 (A) NEGATIVE mg/dL   Hgb urine dipstick MODERATE (A) NEGATIVE   Bilirubin Urine NEGATIVE NEGATIVE   Ketones, ur 5 (A) NEGATIVE mg/dL   Protein, ur 30 (A) NEGATIVE mg/dL   Nitrite NEGATIVE NEGATIVE   Leukocytes, UA LARGE (A) NEGATIVE   RBC / HPF 6-30 0 - 5 RBC/hpf   WBC, UA TOO NUMEROUS TO COUNT 0 - 5 WBC/hpf   Bacteria, UA RARE (A) NONE SEEN   Squamous Epithelial / LPF 0-5 (A) NONE SEEN   Mucous PRESENT   CBG monitoring, ED  Result Value Ref Range   Glucose-Capillary 485 (H) 65 - 99 mg/dL  I-Stat beta hCG blood, ED  Result Value Ref Range   I-stat hCG, quantitative <5.0 <5 mIU/mL   Comment 3            1605:  CBG elevated but pt not acidotic:  IVF NS x2L and SQ insulin 10 units given. +UTI, UC pending: lactic acid ordered. Remains afebrile in ED with stable BP. CT-A chest pending. Pt will need re-check of CBG after interventions. Dispo based on results. Sign out to  Dr. Ethelda Chick.    Final Clinical Impressions(s) / ED Diagnoses   Final diagnoses:  None    New Prescriptions New Prescriptions   No medications on file         Samuel Jester, DO 05/18/17 1610

## 2017-05-18 NOTE — ED Notes (Signed)
Pt visitor to nurse's station stating pt wishing to speak with head nurse.  Pt voiced concerns that her primary nurse said "what the hell" while in her room and she felt she was "rude and unprofessional". This RN apologized that the patient was upset and offered to take over her care. Pt refused and requested her IV be removed. Pt paced around room with pressured speech and flight of ideas.   RN explained to patient risks of leaving without receiving results of CT scan or being reevaluated by EDP, pt states she didn't care and would come back if she started feeling worse.   IV removed and patient witnessed walking out through EMS doors with visitors. Staggering gait.

## 2017-05-18 NOTE — ED Notes (Signed)
Patient transported to CT 

## 2017-05-18 NOTE — ED Notes (Signed)
Pt. Was difficult to arouse when I entered the room to give meds. Responded to rigorous stimulation. Will continue to monitor.

## 2017-05-18 NOTE — ED Notes (Signed)
Entered pt's room to draw I-stat and pt found to be pulling the tape off of her IV access.  Asked pt what she was doing and pt became very defensive and argumentative.  States she was just scratching around it.  Pt states she did not like my attitude and wanted another nurse.  Informed pt I was her nurse and I was concerned about why she was messing with her IV.  Pt then stated she wanted to leave.  Offered to remove IV which pt refused.  Then allowed me to draw her blood and states she did not appreciate my cursing her.  I did not curse pt and told her this.  Pt states she heard me.  Apologized for whatever she thought she heard.  Pt having flight of ideas and difficulty focusing at this time.  Also unable to sit still in bed for blood draw.  Concerned pt may have been injecting into IV as she has track marks on her arms.  Charge nurse Tiffany aware and speaking with pt.

## 2017-05-20 LAB — URINE CULTURE: Culture: >=100000 — AB

## 2017-05-21 ENCOUNTER — Telehealth: Payer: Self-pay

## 2017-05-21 NOTE — Telephone Encounter (Signed)
No treatment for UC ED 05/18/17 per Lysle Pearl Pharm D

## 2017-09-11 ENCOUNTER — Telehealth: Payer: Self-pay | Admitting: Emergency Medicine

## 2017-09-11 NOTE — Telephone Encounter (Signed)
Lost to followup 

## 2018-03-20 ENCOUNTER — Encounter (HOSPITAL_COMMUNITY): Payer: Self-pay | Admitting: Emergency Medicine

## 2018-03-20 ENCOUNTER — Other Ambulatory Visit: Payer: Self-pay

## 2018-03-20 ENCOUNTER — Emergency Department (HOSPITAL_COMMUNITY)
Admission: EM | Admit: 2018-03-20 | Discharge: 2018-03-20 | Disposition: A | Discharge status: Home / Self Care | Payer: Medicaid Other | Attending: Emergency Medicine | Admitting: Emergency Medicine

## 2018-03-20 DIAGNOSIS — A599 Trichomoniasis, unspecified: Secondary | ICD-10-CM | POA: Diagnosis not present

## 2018-03-20 DIAGNOSIS — F17210 Nicotine dependence, cigarettes, uncomplicated: Secondary | ICD-10-CM | POA: Diagnosis not present

## 2018-03-20 DIAGNOSIS — N3001 Acute cystitis with hematuria: Secondary | ICD-10-CM | POA: Diagnosis not present

## 2018-03-20 DIAGNOSIS — E1065 Type 1 diabetes mellitus with hyperglycemia: Secondary | ICD-10-CM | POA: Diagnosis not present

## 2018-03-20 DIAGNOSIS — Z79899 Other long term (current) drug therapy: Secondary | ICD-10-CM | POA: Diagnosis not present

## 2018-03-20 DIAGNOSIS — Z794 Long term (current) use of insulin: Secondary | ICD-10-CM | POA: Diagnosis not present

## 2018-03-20 DIAGNOSIS — R109 Unspecified abdominal pain: Secondary | ICD-10-CM | POA: Diagnosis present

## 2018-03-20 DIAGNOSIS — R739 Hyperglycemia, unspecified: Secondary | ICD-10-CM

## 2018-03-20 LAB — COMPREHENSIVE METABOLIC PANEL
ALT: 15 U/L (ref 14–54)
AST: 20 U/L (ref 15–41)
Albumin: 4.1 g/dL (ref 3.5–5.0)
Alkaline Phosphatase: 72 U/L (ref 38–126)
Anion gap: 14 (ref 5–15)
BUN: 19 mg/dL (ref 6–20)
CHLORIDE: 100 mmol/L — AB (ref 101–111)
CO2: 24 mmol/L (ref 22–32)
Calcium: 9.6 mg/dL (ref 8.9–10.3)
Creatinine, Ser: 0.71 mg/dL (ref 0.44–1.00)
Glucose, Bld: 273 mg/dL — ABNORMAL HIGH (ref 65–99)
POTASSIUM: 4.3 mmol/L (ref 3.5–5.1)
Sodium: 138 mmol/L (ref 135–145)
Total Bilirubin: 0.7 mg/dL (ref 0.3–1.2)
Total Protein: 8.6 g/dL — ABNORMAL HIGH (ref 6.5–8.1)

## 2018-03-20 LAB — LIPASE, BLOOD: LIPASE: 19 U/L (ref 11–51)

## 2018-03-20 LAB — WET PREP, GENITAL
Sperm: NONE SEEN
Yeast Wet Prep HPF POC: NONE SEEN

## 2018-03-20 LAB — URINALYSIS, ROUTINE W REFLEX MICROSCOPIC
Bilirubin Urine: NEGATIVE
Glucose, UA: >=500 mg/dL — AB
KETONES UR: 80 mg/dL — AB
Nitrite: POSITIVE — AB
Protein, ur: 100 mg/dL — AB
Specific Gravity, Urine: 1.035 — ABNORMAL HIGH (ref 1.005–1.030)
pH: 5 (ref 5.0–8.0)

## 2018-03-20 LAB — I-STAT BETA HCG BLOOD, ED (MC, WL, AP ONLY)

## 2018-03-20 LAB — CBC
HEMATOCRIT: 46.6 % — AB (ref 36.0–46.0)
Hemoglobin: 15.6 g/dL — ABNORMAL HIGH (ref 12.0–15.0)
MCH: 28.9 pg (ref 26.0–34.0)
MCHC: 33.5 g/dL (ref 30.0–36.0)
MCV: 86.3 fL (ref 78.0–100.0)
Platelets: 285 10*3/uL (ref 150–400)
RBC: 5.4 MIL/uL — AB (ref 3.87–5.11)
RDW: 13.8 % (ref 11.5–15.5)
WBC: 8.9 10*3/uL (ref 4.0–10.5)

## 2018-03-20 LAB — CBG MONITORING, ED: Glucose-Capillary: 264 mg/dL — ABNORMAL HIGH (ref 65–99)

## 2018-03-20 MED ORDER — METRONIDAZOLE 500 MG PO TABS: 500.0000 mg | ORAL_TABLET | Freq: Two times a day (BID) | ORAL | 0 refills | Status: DC

## 2018-03-20 MED ORDER — AZITHROMYCIN 250 MG PO TABS
1000.0000 mg | ORAL_TABLET | Freq: Once | ORAL | Status: AC
  Administered 2018-03-20: 1000 mg via ORAL
  Filled 2018-03-20: qty 4

## 2018-03-20 MED ORDER — SODIUM CHLORIDE 0.9 % IV BOLUS
1000.0000 mL | Freq: Once | INTRAVENOUS | Status: AC
  Administered 2018-03-20: 1000 mL via INTRAVENOUS

## 2018-03-20 MED ORDER — ONDANSETRON HCL 4 MG PO TABS: 4.0000 mg | ORAL_TABLET | Freq: Four times a day (QID) | ORAL | 0 refills | Status: DC

## 2018-03-20 MED ORDER — LIDOCAINE HCL (PF) 1 % IJ SOLN
INTRAMUSCULAR | Status: AC
  Administered 2018-03-20: 0.9 mL
  Filled 2018-03-20: qty 5

## 2018-03-20 MED ORDER — CEFTRIAXONE SODIUM 250 MG IJ SOLR
250.0000 mg | Freq: Once | INTRAMUSCULAR | Status: AC
  Administered 2018-03-20: 250 mg via INTRAMUSCULAR
  Filled 2018-03-20: qty 250

## 2018-03-20 MED ORDER — ONDANSETRON HCL 4 MG/2ML IJ SOLN
4.0000 mg | Freq: Once | INTRAMUSCULAR | Status: AC
  Administered 2018-03-20: 4 mg via INTRAVENOUS
  Filled 2018-03-20: qty 2

## 2018-03-20 MED ORDER — CEPHALEXIN 500 MG PO CAPS: 500.0000 mg | ORAL_CAPSULE | Freq: Two times a day (BID) | ORAL | 0 refills | Status: DC

## 2018-03-20 NOTE — Discharge Instructions (Addendum)
Please read attached information. If you experience any new or worsening signs or symptoms please return to the emergency room for evaluation. Please follow-up with your primary care provider or specialist as discussed. Please use medication prescribed only as directed and discontinue taking if you have any concerning signs or symptoms.   °

## 2018-03-20 NOTE — ED Triage Notes (Signed)
Pt states she has been having abd pain n/v for several days. Pt is type 1 diabetic, hasn't taken insulin due to not eating. Pt thirsty, feeling like her CBG may be high. WIll check in triage

## 2018-03-20 NOTE — ED Provider Notes (Signed)
[facility] EMERGENCY DEPARTMENT Provider Note   CSN: [id number] Arrival date & time: 03/20/18  1507   History   Chief Complaint Chief Complaint  Patient presents with  . Abdominal Pain  . Hyperglycemia    HPI [name] is a 32 y.o. female.  HPI   32 year old female presents today with complaints of urinary tract infection.  Patient notes approximately 2 weeks ago she was diagnosed with UTI.  She notes she was seen by her primary care provider and diagnosed with urinary tract infection.  She was put on Bactrim for 10 days.  She notes that after taking the antibiotics his symptoms resolved and was back to her baseline.  She notes this was approximately 7 days ago.  Patient notes over the last 2 to 3 days she has had nausea, burning with urination, and pain in the suprapubic region.  Patient denies any fevers, she notes some chronic nausea but had vomiting yesterday.  She reports white vaginal discharge after completing antibiotics.  She notes she is a type I diabetic, notes she did not take her insulin today as she has not had anything to eat.   Past Medical History:  Diagnosis Date  . Diabetes mellitus   . DKA 05/01/2011  . Polysubstance abuse [facility])     Patient Active Problem List   Diagnosis Date Noted  . UTI (lower urinary tract infection) 07/14/2016  . Panic disorder without agoraphobia with moderate panic attacks 01/20/2016  . Benzodiazepine abuse (HCC) 01/20/2016  . Substance induced mood disorder (HCC) 01/19/2016  . Diabetes mellitus type 1, uncontrolled (HCC) 05/01/2011  . Leukocytosis 05/01/2011    Past Surgical History:  Procedure Laterality Date  . TUBAL LIGATION    . WOUND EXPLORATION Left 07/26/2014   Procedure: Irrigation, debridement and exploration of elbow wounds;  Surgeon: Betha Loa, MD;  Location: [facility] OR;  Service: Orthopedics;  Laterality: Left;     OB History    Gravida  2   Para  2   Term      Preterm      AB      Living        SAB      TAB      Ectopic      Multiple      Live Births               Home Medications    Prior to Admission medications   Medication Sig Start Date End Date Taking? Authorizing Provider  acetaminophen (TYLENOL) 500 MG tablet Take 1,500 mg by mouth every 6 (six) hours as needed for mild pain or moderate pain.    Yes [provider]  insulin aspart (NOVOLOG FLEXPEN) 100 UNIT/ML FlexPen Inject into the skin See admin instructions. Inject 2 units per 15 carbs into the skin three times a day with meals, per sliding scale   Yes [provider]  Insulin Glargine (LANTUS SOLOSTAR) 100 UNIT/ML Solostar Pen Inject 35 Units into the skin daily at 10 pm. Patient taking differently: Inject 35 Units into the skin at bedtime.  07/15/16  Yes Lonia Blood, MD  omeprazole (PRILOSEC) 40 MG capsule Take 40 mg by mouth 2 (two) times daily.   Yes [provider]  ranitidine (ZANTAC) 150 MG tablet Take 150 mg by mouth 2 (two) times daily.   Yes [provider]  cephALEXin (KEFLEX) 500 MG capsule Take 1 capsule (500 mg total) by mouth 2 (two) times daily. 03/20/18  Verlean Allport, Tinnie Gens, PA-C  metroNIDAZOLE (FLAGYL) 500 MG tablet Take 1 tablet (500 mg total) by mouth 2 (two) times daily. 03/20/18   Jakorey Mcconathy, Tinnie Gens, PA-C  ondansetron (ZOFRAN) 4 MG tablet Take 1 tablet (4 mg total) by mouth every 6 (six) hours. 03/20/18   Eyvonne Mechanic, PA-C    Family History Family History  Problem Relation Age of Onset  . Coronary artery disease Mother   . Lung cancer Mother   . Heart attack Father 26  . Thyroid disease Maternal Grandmother   . Diabetes type I Daughter     Social History Social History   Tobacco Use  . Smoking status: Current Every Day Smoker    Packs/day: 1.00    Years: 15.00    Pack years: 15.00    Types: Cigarettes  . Smokeless tobacco: Never Used  . Tobacco comment: She is currently trying to quit.  Substance Use Topics  . Alcohol  use: No  . Drug use: No    Comment: HX of cocaine and marijuana use     Allergies   Codeine; Penicillins; and Tramadol   Review of Systems Review of Systems  All other systems reviewed and are negative.  Physical Exam Updated Vital Signs BP 122/87   Pulse (!) 112   Temp 98 F (36.7 C) (Oral)   Resp 18   LMP 03/12/2018   SpO2 99%   Physical Exam  Constitutional: She is oriented to person, place, and time. She appears well-developed and well-nourished.  HENT:  Head: Normocephalic and atraumatic.  Eyes: Pupils are equal, round, and reactive to light. Conjunctivae are normal. Right eye exhibits no discharge. Left eye exhibits no discharge. No scleral icterus.  Neck: Normal range of motion. No JVD present. No tracheal deviation present.  Pulmonary/Chest: Effort normal. No stridor.  Abdominal: Soft.  Minimal tenderness over the suprapubic region  Genitourinary:  Genitourinary Comments: Purulent discharge noted in vaginal vault, no cervical motion tenderness, no adnexal tenderness or masses  Neurological: She is alert and oriented to person, place, and time. Coordination normal.  Psychiatric: She has a normal mood and affect. Her behavior is normal. Judgment and thought content normal.  Nursing note and vitals reviewed.   ED Treatments / Results  Labs (all labs ordered are listed, but only abnormal results are displayed) Labs Reviewed  WET PREP, GENITAL - Abnormal; Notable for the following components:      Result Value   Trich, Wet Prep PRESENT (*)    Clue Cells Wet Prep HPF POC PRESENT (*)    WBC, Wet Prep HPF POC MODERATE (*)    All other components within normal limits  COMPREHENSIVE METABOLIC PANEL - Abnormal; Notable for the following components:   Chloride 100 (*)    Glucose, Bld 273 (*)    Total Protein 8.6 (*)    All other components within normal limits  CBC - Abnormal; Notable for the following components:   RBC 5.40 (*)    Hemoglobin 15.6 (*)    HCT 46.6  (*)    All other components within normal limits  URINALYSIS, ROUTINE W REFLEX MICROSCOPIC - Abnormal; Notable for the following components:   APPearance CLOUDY (*)    Specific Gravity, Urine 1.035 (*)    Glucose, UA >=500 (*)    Hgb urine dipstick MODERATE (*)    Ketones, ur 80 (*)    Protein, ur 100 (*)    Nitrite POSITIVE (*)    Leukocytes, UA LARGE (*)    Bacteria,  UA FEW (*)    All other components within normal limits  CBG MONITORING, ED - Abnormal; Notable for the following components:   Glucose-Capillary 264 (*)    All other components within normal limits  URINE CULTURE  LIPASE, BLOOD  I-STAT BETA HCG BLOOD, ED ([facility], [facility], [facility] ONLY)  GC/CHLAMYDIA PROBE AMP ([place]) NOT AT [facility]    EKG None  Radiology No results found.  Procedures Procedures (including critical care time)  Medications Ordered in ED Medications  sodium chloride 0.9 % bolus 1,000 mL (0 mLs Intravenous Stopped 03/20/18 2011)  ondansetron (ZOFRAN) injection 4 mg (4 mg Intravenous Given 03/20/18 1828)  cefTRIAXone (ROCEPHIN) injection 250 mg (250 mg Intramuscular Given 03/20/18 2011)  azithromycin (ZITHROMAX) tablet 1,000 mg (1,000 mg Oral Given 03/20/18 2011)  lidocaine (PF) (XYLOCAINE) 1 % injection (0.9 mLs  Given 03/20/18 2012)     Initial Impression / Assessment and Plan / ED Course  I have reviewed the triage vital signs and the nursing notes.  Pertinent labs & imaging results that were available during my care of the patient were reviewed by me and considered in my medical decision making (see chart for details).     Labs: Urine culture, wet prep, GC, i-STAT beta-hCG, lipase, CMP, CBC  Imaging:  Consults:  Therapeutics:  Discharge Meds:   Assessment/Plan: 32 year old female presents today with likely urinary tract infection, trichomoniasis, and hyperglycemia.  Patient's urine concerning for urinary tract infection, she recently did have UTI that resolved, again having symptoms.  Patient's  urine will be cultured, she will be started on Keflex for this.  Patient has trichomoniasis on her wet prep, concern for other STDs as well, she will be prophylactically treated with ceftriaxone and azithromycin (patient has had ceftriaxone in the past) patient also hyperglycemic here if she has not taken her insulin as she was having nausea and vomiting.  She is tolerating p.o. here.,  She was discharged home with Zofran.  Patient has no signs of severe systemic illness, well-appearing, she will be discharged with strict return precautions and follow-up information.  She verbalized understanding and agreement to today's plan had no further questions or concerns.    Final Clinical Impressions(s) / ED Diagnoses   Final diagnoses:  Hyperglycemia  Trichimoniasis  Acute cystitis with hematuria    ED Discharge Orders        Ordered    metroNIDAZOLE (FLAGYL) 500 MG tablet  2 times daily     03/20/18 2010    cephALEXin (KEFLEX) 500 MG capsule  2 times daily     03/20/18 2010    ondansetron (ZOFRAN) 4 MG tablet  Every 6 hours     03/20/18 2010       Eyvonne Mechanic, PA-C 03/20/18 2128    Benjiman Core, MD 03/20/18 2227

## 2018-03-20 NOTE — ED Provider Notes (Signed)
Patient placed in Quick Look pathway, seen and evaluated   Chief Complaint: nausea, vomiting, abdominal pain  HPI:   [name] is a 32 y.o. female who presents to the ED with abd pain n/v for several days. Pt is type 1 diabetic, hasn't taken insulin due to not eating. Pt thirsty, feeling like her CBG may be high. WIll check in triage  ROS: GI: n/v, abdominal pain  Physical Exam:  BP (!) 125/94 (BP Location: Right Arm)   Pulse (!) 123   Temp 98 F (36.7 C) (Oral)   Resp 16   LMP 03/12/2018   SpO2 100%    Gen: No distress  Neuro: Awake and Alert  Skin: Warm and dry  Abdomen: soft, tender with palpation epigastric area.     Initiation of care has begun. The patient has been counseled on the process, plan, and necessity for staying for the completion/evaluation, and the remainder of the medical screening examination    Janne Napoleon, NP 03/20/18 1606    Maia Plan, MD 03/21/18 [phone]

## 2018-03-21 LAB — GC/CHLAMYDIA PROBE AMP (~~LOC~~) NOT AT ARMC
Chlamydia: NEGATIVE
Neisseria Gonorrhea: NEGATIVE

## 2018-03-23 LAB — URINE CULTURE: Culture: >=100000 — AB

## 2018-03-24 ENCOUNTER — Telehealth: Payer: Self-pay

## 2018-03-24 NOTE — Telephone Encounter (Signed)
Post ED Visit - Positive Culture Follow-up  Culture report reviewed by antimicrobial stewardship pharmacist:  []  Enzo Bi, Pharm.D. []  Celedonio Miyamoto, Pharm.D., BCPS AQ-ID []  Garvin Fila, Pharm.D., BCPS []  Georgina Pillion, Pharm.D., BCPS []  [place], [address].D., BCPS, AAHIVP []  Estella Husk, Pharm.D., BCPS, AAHIVP []  Lysle Pearl, PharmD, BCPS []  Sherlynn Carbon, PharmD []  Pollyann Samples, PharmD, BCPS [facility] Pharm D Positive urine culture Treated with Cephalexin, organism sensitive to the same and no further patient follow-up is required at this time.  Jerry Caras 03/24/2018, 10:20 AM

## 2018-05-22 ENCOUNTER — Encounter (HOSPITAL_COMMUNITY): Payer: Self-pay | Admitting: Emergency Medicine

## 2018-05-22 ENCOUNTER — Emergency Department (HOSPITAL_COMMUNITY)
Admission: EM | Admit: 2018-05-22 | Discharge: 2018-05-22 | Disposition: A | Discharge status: Home / Self Care | Payer: Medicaid Other | Attending: Emergency Medicine | Admitting: Emergency Medicine

## 2018-05-22 ENCOUNTER — Other Ambulatory Visit: Payer: Self-pay

## 2018-05-22 DIAGNOSIS — R109 Unspecified abdominal pain: Secondary | ICD-10-CM | POA: Insufficient documentation

## 2018-05-22 DIAGNOSIS — Z5321 Procedure and treatment not carried out due to patient leaving prior to being seen by health care provider: Secondary | ICD-10-CM | POA: Diagnosis not present

## 2018-05-22 LAB — CBC
HEMATOCRIT: 43.8 % (ref 36.0–46.0)
HEMOGLOBIN: 14.8 g/dL (ref 12.0–15.0)
MCH: 30.1 pg (ref 26.0–34.0)
MCHC: 33.8 g/dL (ref 30.0–36.0)
MCV: 89 fL (ref 78.0–100.0)
Platelets: 282 10*3/uL (ref 150–400)
RBC: 4.92 MIL/uL (ref 3.87–5.11)
RDW: 14.3 % (ref 11.5–15.5)
WBC: 9.4 10*3/uL (ref 4.0–10.5)

## 2018-05-22 LAB — COMPREHENSIVE METABOLIC PANEL
ALT: 15 U/L (ref 0–44)
ANION GAP: 12 (ref 5–15)
AST: 14 U/L — AB (ref 15–41)
Albumin: 4.5 g/dL (ref 3.5–5.0)
Alkaline Phosphatase: 64 U/L (ref 38–126)
BUN: 21 mg/dL — ABNORMAL HIGH (ref 6–20)
CHLORIDE: 99 mmol/L (ref 98–111)
CO2: 27 mmol/L (ref 22–32)
Calcium: 9.9 mg/dL (ref 8.9–10.3)
Creatinine, Ser: 0.73 mg/dL (ref 0.44–1.00)
GFR calc non Af Amer: >60 mL/min (ref >60)
GLUCOSE: 354 mg/dL — AB (ref 70–99)
Potassium: 4.1 mmol/L (ref 3.5–5.1)
SODIUM: 138 mmol/L (ref 135–145)
Total Bilirubin: 1 mg/dL (ref 0.3–1.2)
Total Protein: 8.2 g/dL — ABNORMAL HIGH (ref 6.5–8.1)

## 2018-05-22 LAB — CBG MONITORING, ED: Glucose-Capillary: 341 mg/dL — ABNORMAL HIGH (ref 70–99)

## 2018-05-22 LAB — HCG, QUANTITATIVE, PREGNANCY

## 2018-05-22 LAB — LIPASE, BLOOD: Lipase: 21 U/L (ref 11–51)

## 2018-05-22 MED ORDER — ONDANSETRON 4 MG PO TBDP
4.0000 mg | ORAL_TABLET | Freq: Once | ORAL | Status: AC | PRN
  Administered 2018-05-22: 4 mg via ORAL
  Filled 2018-05-22: qty 1

## 2018-05-22 NOTE — ED Triage Notes (Addendum)
Patient complaining of lower abdominal pain with vomiting x 3 days. States she was treated at [facility] recently for "infection in my intestines and I haven't had a bowel movement in 3 weeks." States she tried to follow up with PCP but can't get appt until September.

## 2018-05-23 NOTE — ED Notes (Signed)
Follow up call made  No answer  05/23/18  1028  s Aqib Lough rn

## 2018-05-26 ENCOUNTER — Encounter (HOSPITAL_COMMUNITY): Payer: Self-pay | Admitting: Emergency Medicine

## 2018-05-26 ENCOUNTER — Emergency Department (HOSPITAL_COMMUNITY)
Admission: EM | Admit: 2018-05-26 | Discharge: 2018-05-26 | Disposition: A | Discharge status: Home / Self Care | Payer: Medicaid Other | Attending: Emergency Medicine | Admitting: Emergency Medicine

## 2018-05-26 ENCOUNTER — Emergency Department (HOSPITAL_COMMUNITY): Payer: Medicaid Other

## 2018-05-26 DIAGNOSIS — E109 Type 1 diabetes mellitus without complications: Secondary | ICD-10-CM | POA: Insufficient documentation

## 2018-05-26 DIAGNOSIS — Z794 Long term (current) use of insulin: Secondary | ICD-10-CM | POA: Diagnosis not present

## 2018-05-26 DIAGNOSIS — E162 Hypoglycemia, unspecified: Secondary | ICD-10-CM | POA: Insufficient documentation

## 2018-05-26 DIAGNOSIS — F17210 Nicotine dependence, cigarettes, uncomplicated: Secondary | ICD-10-CM | POA: Diagnosis not present

## 2018-05-26 DIAGNOSIS — Z79899 Other long term (current) drug therapy: Secondary | ICD-10-CM | POA: Diagnosis not present

## 2018-05-26 DIAGNOSIS — K5900 Constipation, unspecified: Secondary | ICD-10-CM | POA: Insufficient documentation

## 2018-05-26 LAB — URINALYSIS, ROUTINE W REFLEX MICROSCOPIC
BACTERIA UA: NONE SEEN
Bilirubin Urine: NEGATIVE
Glucose, UA: >=500 mg/dL — AB
KETONES UR: 20 mg/dL — AB
Nitrite: NEGATIVE
PH: 5 (ref 5.0–8.0)
Protein, ur: NEGATIVE mg/dL
SPECIFIC GRAVITY, URINE: 1.027 (ref 1.005–1.030)
WBC, UA: >50 WBC/hpf — ABNORMAL HIGH (ref 0–5)

## 2018-05-26 LAB — CBC WITH DIFFERENTIAL/PLATELET
BASOS ABS: 0 10*3/uL (ref 0.0–0.1)
BASOS PCT: 0 %
Eosinophils Absolute: 0 10*3/uL (ref 0.0–0.7)
Eosinophils Relative: 0 %
HEMATOCRIT: 46.3 % — AB (ref 36.0–46.0)
HEMOGLOBIN: 16.1 g/dL — AB (ref 12.0–15.0)
LYMPHS PCT: 12 %
Lymphs Abs: 1.3 10*3/uL (ref 0.7–4.0)
MCH: 30.7 pg (ref 26.0–34.0)
MCHC: 34.8 g/dL (ref 30.0–36.0)
MCV: 88.2 fL (ref 78.0–100.0)
MONO ABS: 0.5 10*3/uL (ref 0.1–1.0)
Monocytes Relative: 4 %
NEUTROS ABS: 9.6 10*3/uL — AB (ref 1.7–7.7)
NEUTROS PCT: 84 %
Platelets: 262 10*3/uL (ref 150–400)
RBC: 5.25 MIL/uL — AB (ref 3.87–5.11)
RDW: 14 % (ref 11.5–15.5)
WBC: 11.5 10*3/uL — ABNORMAL HIGH (ref 4.0–10.5)

## 2018-05-26 LAB — COMPREHENSIVE METABOLIC PANEL
ALBUMIN: 4.4 g/dL (ref 3.5–5.0)
ALK PHOS: 58 U/L (ref 38–126)
ALT: 13 U/L (ref 0–44)
AST: 17 U/L (ref 15–41)
Anion gap: 11 (ref 5–15)
BILIRUBIN TOTAL: 0.8 mg/dL (ref 0.3–1.2)
BUN: 17 mg/dL (ref 6–20)
CALCIUM: 9.5 mg/dL (ref 8.9–10.3)
CO2: 27 mmol/L (ref 22–32)
CREATININE: 0.61 mg/dL (ref 0.44–1.00)
Chloride: 101 mmol/L (ref 98–111)
GFR calc Af Amer: >60 mL/min (ref >60)
GLUCOSE: 164 mg/dL — AB (ref 70–99)
POTASSIUM: 3.2 mmol/L — AB (ref 3.5–5.1)
Sodium: 139 mmol/L (ref 135–145)
TOTAL PROTEIN: 8.5 g/dL — AB (ref 6.5–8.1)

## 2018-05-26 LAB — CBG MONITORING, ED
GLUCOSE-CAPILLARY: 181 mg/dL — AB (ref 70–99)
GLUCOSE-CAPILLARY: 340 mg/dL — AB (ref 70–99)

## 2018-05-26 LAB — LIPASE, BLOOD: LIPASE: 18 U/L (ref 11–51)

## 2018-05-26 LAB — PREGNANCY, URINE: Preg Test, Ur: NEGATIVE

## 2018-05-26 MED ORDER — SODIUM CHLORIDE 0.9 % IV SOLN
INTRAVENOUS | Status: DC
  Administered 2018-05-26: 08:00:00 via INTRAVENOUS

## 2018-05-26 NOTE — Discharge Instructions (Signed)
Please take MiraLAX 4 times a day until you are having regular stools, then cut it back to twice a day, if you continue to have regular stools or watery stools go back to once a day.  If you should develop blood in the stools or increasing pain or vomiting return to the emergency department.  Your x-ray shows no signs of blockages, your urine sample shows no bacteria but lots of blood cells so a urine culture was ordered.  If you do have a urinary infection that needs a treatment other than the cephalexin which you are currently taking then a antibiotic will be prescribed for you.

## 2018-05-26 NOTE — ED Provider Notes (Signed)
[facility] EMERGENCY DEPARTMENT Provider Note   CSN: [id number] Arrival date & time: 05/26/18  4098     History   Chief Complaint Chief Complaint  Patient presents with  . Hypoglycemia    HPI [name] is a 32 y.o. female.  HPI  32 year old female, has a history of diabetes, type I as well as a history of recurrent DKA and a history of polysubstance abuse.  She presents to the hospital today complaining of low blood sugar.  The patient does not recall the exact circumstances surrounding this is when she woke up this morning there was paramedics in the room.  Per the boyfriend who called for ambulance transport she had altered mental status, had a blood sugar of 39 and was given a sandwich and something to drink and mental status improved rather quickly.  The patient has no recollection of any of this.  She does state that her A1c was as high as 16 as recently as 6 months ago but she has been compliant with her medications including her insulin.  She has had some abdominal pain for the last month as well as no bowel movements in over 3 weeks and has been seen at an outside hospital where she was diagnosed with a bowel infection and given cephalexin which she states she has been taking.  She has had no diarrhea, no stool, no dysuria, no fevers, no back pain, no rashes, no coughing or shortness of breath.  The abdominal pain is rather persistent, she has tried to take laxatives for constipation but this has not helped.  Past Medical History:  Diagnosis Date  . Diabetes mellitus   . DKA 05/01/2011  . Polysubstance abuse [facility])     Patient Active Problem List   Diagnosis Date Noted  . UTI (lower urinary tract infection) 07/14/2016  . Panic disorder without agoraphobia with moderate panic attacks 01/20/2016  . Benzodiazepine abuse (HCC) 01/20/2016  . Substance induced mood disorder (HCC) 01/19/2016  . Diabetes mellitus type 1, uncontrolled (HCC) 05/01/2011  . Leukocytosis  05/01/2011    Past Surgical History:  Procedure Laterality Date  . TUBAL LIGATION    . WOUND EXPLORATION Left 07/26/2014   Procedure: Irrigation, debridement and exploration of elbow wounds;  Surgeon: Betha Loa, MD;  Location: [facility] OR;  Service: Orthopedics;  Laterality: Left;     OB History    Gravida  2   Para  2   Term      Preterm      AB      Living        SAB      TAB      Ectopic      Multiple      Live Births               Home Medications    Prior to Admission medications   Medication Sig Start Date End Date Taking? Authorizing Provider  acetaminophen (TYLENOL) 500 MG tablet Take 1,500 mg by mouth every 6 (six) hours as needed for mild pain or moderate pain.     [provider]  cephALEXin (KEFLEX) 500 MG capsule Take 1 capsule (500 mg total) by mouth 2 (two) times daily. 03/20/18   Hedges, Tinnie Gens, PA-C  insulin aspart (NOVOLOG FLEXPEN) 100 UNIT/ML FlexPen Inject into the skin See admin instructions. Inject 2 units per 15 carbs into the skin three times a day with meals, per sliding scale    [provider]  Insulin  Glargine (LANTUS SOLOSTAR) 100 UNIT/ML Solostar Pen Inject 35 Units into the skin daily at 10 pm. Patient taking differently: Inject 35 Units into the skin at bedtime.  07/15/16   Lonia Blood, MD  metroNIDAZOLE (FLAGYL) 500 MG tablet Take 1 tablet (500 mg total) by mouth 2 (two) times daily. 03/20/18   Hedges, Tinnie Gens, PA-C  omeprazole (PRILOSEC) 40 MG capsule Take 40 mg by mouth 2 (two) times daily.    [provider]  ondansetron (ZOFRAN) 4 MG tablet Take 1 tablet (4 mg total) by mouth every 6 (six) hours. 03/20/18   Hedges, Tinnie Gens, PA-C  ranitidine (ZANTAC) 150 MG tablet Take 150 mg by mouth 2 (two) times daily.    [provider]    Family History Family History  Problem Relation Age of Onset  . Coronary artery disease Mother   . Lung cancer Mother   . Heart attack Father 53  . Thyroid disease  Maternal Grandmother   . Diabetes type I Daughter     Social History Social History   Tobacco Use  . Smoking status: Current Every Day Smoker    Packs/day: 1.00    Years: 15.00    Pack years: 15.00    Types: Cigarettes  . Smokeless tobacco: Never Used  . Tobacco comment: She is currently trying to quit.  Substance Use Topics  . Alcohol use: No  . Drug use: No    Comment: HX of cocaine and marijuana use     Allergies   Codeine; Penicillins; and Tramadol   Review of Systems Review of Systems  All other systems reviewed and are negative.    Physical Exam Updated Vital Signs BP 138/90 (BP Location: Left Arm)   Pulse (!) 103   Resp 18   Ht 5' (1.524 m)   Wt 47.6 kg   LMP 05/15/2018   SpO2 100%   BMI 20.51 kg/m   Physical Exam  Constitutional: She appears well-developed and well-nourished. No distress.  HENT:  Head: Normocephalic and atraumatic.  Mouth/Throat: Oropharynx is clear and moist. No oropharyngeal exudate.  Mucous membranes are moist, no gingival swelling trismus or torticollis  Eyes: Pupils are equal, round, and reactive to light. Conjunctivae and EOM are normal. Right eye exhibits no discharge. Left eye exhibits no discharge. No scleral icterus.  Neck: Normal range of motion. Neck supple. No JVD present. No thyromegaly present.  Cardiovascular: Normal rate, regular rhythm, normal heart sounds and intact distal pulses. Exam reveals no gallop and no friction rub.  No murmur heard. Heart rate of 95, normal pulses, no edema  Pulmonary/Chest: Effort normal and breath sounds normal. No respiratory distress. She has no wheezes. She has no rales.  Lung sounds are clear without any increased work of breathing  Abdominal: Soft. Bowel sounds are normal. She exhibits no distension and no mass. There is no tenderness.  Abdomen is nondistended, she is very thin, she has mild bilateral lower abdominal tenderness without masses palpated guarding or peritoneal signs    Musculoskeletal: Normal range of motion. She exhibits no edema or tenderness.  Lymphadenopathy:    She has no cervical adenopathy.  Neurological: She is alert. Coordination normal.  The patient is awake alert and able to follow all of my commands, she has no changes in her mental status, speech is clear, cranial nerves III through XII are normal, strength is normal in all 4 extremities  Skin: Skin is warm and dry. No rash noted. No erythema.  Left lower extremity with  some healing scabs to the left ankle  Psychiatric: She has a normal mood and affect. Her behavior is normal.  Nursing note and vitals reviewed.    ED Treatments / Results  Labs (all labs ordered are listed, but only abnormal results are displayed) Labs Reviewed  CBC WITH DIFFERENTIAL/PLATELET - Abnormal; Notable for the following components:      Result Value   WBC 11.5 (*)    RBC 5.25 (*)    Hemoglobin 16.1 (*)    HCT 46.3 (*)    Neutro Abs 9.6 (*)    All other components within normal limits  COMPREHENSIVE METABOLIC PANEL - Abnormal; Notable for the following components:   Potassium 3.2 (*)    Glucose, Bld 164 (*)    Total Protein 8.5 (*)    All other components within normal limits  URINALYSIS, ROUTINE W REFLEX MICROSCOPIC - Abnormal; Notable for the following components:   APPearance CLOUDY (*)    Glucose, UA >=500 (*)    Hgb urine dipstick MODERATE (*)    Ketones, ur 20 (*)    Leukocytes, UA MODERATE (*)    WBC, UA >50 (*)    All other components within normal limits  CBG MONITORING, ED - Abnormal; Notable for the following components:   Glucose-Capillary 181 (*)    All other components within normal limits  CBG MONITORING, ED - Abnormal; Notable for the following components:   Glucose-Capillary 340 (*)    All other components within normal limits  URINE CULTURE  LIPASE, BLOOD  PREGNANCY, URINE  GC/CHLAMYDIA PROBE AMP ([place]) NOT AT [facility]    EKG None  Radiology Dg Abd Acute  W/chest  Result Date: 05/26/2018 CLINICAL DATA:  Abdominal pain, vomiting, constipation and hypoglycemia. Smoker. EXAM: DG ABDOMEN ACUTE W/ 1V CHEST COMPARISON:  Chest radiograph dated 05/23/2018, chest, abdomen pelvis radiographs dated 05/15/2018, chest CTA dated 05/18/2017 and abdomen and pelvis CT dated 20,018. FINDINGS: Normal sized heart. Clear lungs. Mild peribronchial thickening. Bilateral nipple shadows. Normal bowel gas pattern. Prominent stool in the left and rectosigmoid colon. Unremarkable bones. IMPRESSION: 1. No acute abnormality. 2. Mild chronic bronchitic changes. 3. Prominent stool in the left and rectosigmoid colon. Electronically Signed   By: Beckie Salts M.D.   On: 05/26/2018 09:17    Procedures Procedures (including critical care time)  Medications Ordered in ED Medications  0.9 %  sodium chloride infusion ( Intravenous New Bag/Given 05/26/18 6045)     Initial Impression / Assessment and Plan / ED Course  I have reviewed the triage vital signs and the nursing notes.  Pertinent labs & imaging results that were available during my care of the patient were reviewed by me and considered in my medical decision making (see chart for details).  Clinical Course as of May 26 944  Sat May 26, 2018  4098 Tray confirms prominent stool burden on the left side, not pregnant, urinalysis with greater than 50 white blood cells but no bacteria seen, will add on gonorrhea and Chlamydia testing to the sample.  Patient has no STD symptoms.  Her blood sugar has remained in a normal range, she appears continually well.  Urine culture added as well.  Stable for discharge   [BM]  [phone] On repeat exam the patient again denies any urinary symptoms, her vital signs were reviewed and currently they are normal.  She is willing to go home with a constipation treatment regimen and to follow-up as needed.   [BM]    Clinical  Course User Index [BM] Eber Hong, MD    The patient has a history of  diabetes and likely with her decrease in appetite, occasional vomiting and abdominal discomfort has not had very much in the way of calories but is still taking her insulin which I suspect is the cause of the hypoglycemia.  Her mental status at this time is totally back to normal, her blood sugar is tested here was 180, her vital signs are overall unremarkable.  We will proceed with basic lab work, acute abdominal series and a urinalysis.  She has had a bilateral tubal ligation but no other abdominal surgery in the past.  Doubt bowel obstruction given her abdominal exam however she does appear to have some severe constipation and with her history of diabetes which has been uncontrolled fairly severely it would not surprise me if she has some bowel motility abnormalities.  Final Clinical Impressions(s) / ED Diagnoses   Final diagnoses:  Hypoglycemia  Constipation, unspecified constipation type    ED Discharge Orders    None       Eber Hong, MD 05/26/18 [phone]

## 2018-05-26 NOTE — ED Triage Notes (Signed)
Pt reports her boyfriend called this morning for hypoglycemia.  Pt was given 1/2 sandwich and milk with resolution of altered mental status (initial glucose 39).  Pt also reports abd pain which has been ongoing.  States she was treated for intestinal infection at [facility] recently with Keflex.

## 2018-05-28 LAB — URINE CULTURE: Culture: <10000 — AB

## 2018-09-15 ENCOUNTER — Encounter (HOSPITAL_COMMUNITY): Payer: Self-pay | Admitting: Emergency Medicine

## 2018-09-15 ENCOUNTER — Emergency Department (HOSPITAL_COMMUNITY): Payer: Medicaid Other

## 2018-09-15 ENCOUNTER — Other Ambulatory Visit: Payer: Self-pay

## 2018-09-15 ENCOUNTER — Observation Stay (HOSPITAL_COMMUNITY)
Admission: EM | Admit: 2018-09-15 | Discharge: 2018-09-16 | Disposition: A | Discharge status: Home / Self Care | Payer: Medicaid Other | Attending: Internal Medicine | Admitting: Internal Medicine

## 2018-09-15 DIAGNOSIS — Z885 Allergy status to narcotic agent status: Secondary | ICD-10-CM | POA: Diagnosis not present

## 2018-09-15 DIAGNOSIS — F17210 Nicotine dependence, cigarettes, uncomplicated: Secondary | ICD-10-CM | POA: Diagnosis not present

## 2018-09-15 DIAGNOSIS — Z794 Long term (current) use of insulin: Secondary | ICD-10-CM | POA: Diagnosis not present

## 2018-09-15 DIAGNOSIS — E1065 Type 1 diabetes mellitus with hyperglycemia: Secondary | ICD-10-CM | POA: Diagnosis not present

## 2018-09-15 DIAGNOSIS — Z801 Family history of malignant neoplasm of trachea, bronchus and lung: Secondary | ICD-10-CM | POA: Insufficient documentation

## 2018-09-15 DIAGNOSIS — E1010 Type 1 diabetes mellitus with ketoacidosis without coma: Principal | ICD-10-CM | POA: Insufficient documentation

## 2018-09-15 DIAGNOSIS — D72829 Elevated white blood cell count, unspecified: Secondary | ICD-10-CM | POA: Insufficient documentation

## 2018-09-15 DIAGNOSIS — F410 Panic disorder [episodic paroxysmal anxiety] without agoraphobia: Secondary | ICD-10-CM | POA: Diagnosis not present

## 2018-09-15 DIAGNOSIS — N390 Urinary tract infection, site not specified: Secondary | ICD-10-CM | POA: Diagnosis present

## 2018-09-15 DIAGNOSIS — Z888 Allergy status to other drugs, medicaments and biological substances status: Secondary | ICD-10-CM | POA: Diagnosis not present

## 2018-09-15 DIAGNOSIS — R059 Cough, unspecified: Secondary | ICD-10-CM | POA: Diagnosis present

## 2018-09-15 DIAGNOSIS — Z720 Tobacco use: Secondary | ICD-10-CM | POA: Diagnosis not present

## 2018-09-15 DIAGNOSIS — R05 Cough: Secondary | ICD-10-CM | POA: Diagnosis not present

## 2018-09-15 DIAGNOSIS — E43 Unspecified severe protein-calorie malnutrition: Secondary | ICD-10-CM | POA: Insufficient documentation

## 2018-09-15 DIAGNOSIS — K219 Gastro-esophageal reflux disease without esophagitis: Secondary | ICD-10-CM | POA: Diagnosis not present

## 2018-09-15 DIAGNOSIS — Z79899 Other long term (current) drug therapy: Secondary | ICD-10-CM | POA: Insufficient documentation

## 2018-09-15 DIAGNOSIS — Z8349 Family history of other endocrine, nutritional and metabolic diseases: Secondary | ICD-10-CM | POA: Diagnosis not present

## 2018-09-15 DIAGNOSIS — F1994 Other psychoactive substance use, unspecified with psychoactive substance-induced mood disorder: Secondary | ICD-10-CM | POA: Insufficient documentation

## 2018-09-15 DIAGNOSIS — E1069 Type 1 diabetes mellitus with other specified complication: Secondary | ICD-10-CM | POA: Diagnosis present

## 2018-09-15 DIAGNOSIS — Z833 Family history of diabetes mellitus: Secondary | ICD-10-CM | POA: Diagnosis not present

## 2018-09-15 DIAGNOSIS — R112 Nausea with vomiting, unspecified: Secondary | ICD-10-CM | POA: Diagnosis present

## 2018-09-15 DIAGNOSIS — Z8249 Family history of ischemic heart disease and other diseases of the circulatory system: Secondary | ICD-10-CM | POA: Diagnosis not present

## 2018-09-15 DIAGNOSIS — Z880 Allergy status to penicillin: Secondary | ICD-10-CM | POA: Diagnosis not present

## 2018-09-15 DIAGNOSIS — F1910 Other psychoactive substance abuse, uncomplicated: Secondary | ICD-10-CM | POA: Diagnosis present

## 2018-09-15 DIAGNOSIS — E1110 Type 2 diabetes mellitus with ketoacidosis without coma: Secondary | ICD-10-CM | POA: Diagnosis present

## 2018-09-15 LAB — CBC WITH DIFFERENTIAL/PLATELET
ABS IMMATURE GRANULOCYTES: 0.03 10*3/uL (ref 0.00–0.07)
Basophils Absolute: 0 10*3/uL (ref 0.0–0.1)
Basophils Relative: 0 %
Eosinophils Absolute: 0 10*3/uL (ref 0.0–0.5)
Eosinophils Relative: 0 %
HEMATOCRIT: 51.5 % — AB (ref 36.0–46.0)
HEMOGLOBIN: 16.5 g/dL — AB (ref 12.0–15.0)
Immature Granulocytes: 0 %
LYMPHS PCT: 23 %
Lymphs Abs: 2.3 10*3/uL (ref 0.7–4.0)
MCH: 28.5 pg (ref 26.0–34.0)
MCHC: 32 g/dL (ref 30.0–36.0)
MCV: 88.9 fL (ref 80.0–100.0)
MONO ABS: 0.6 10*3/uL (ref 0.1–1.0)
MONOS PCT: 6 %
NEUTROS ABS: 6.8 10*3/uL (ref 1.7–7.7)
Neutrophils Relative %: 71 %
Platelets: 320 10*3/uL (ref 150–400)
RBC: 5.79 MIL/uL — ABNORMAL HIGH (ref 3.87–5.11)
RDW: 13.5 % (ref 11.5–15.5)
WBC: 9.8 10*3/uL (ref 4.0–10.5)
nRBC: 0 % (ref 0.0–0.2)

## 2018-09-15 LAB — COMPREHENSIVE METABOLIC PANEL
ALT: 18 U/L (ref 0–44)
AST: 18 U/L (ref 15–41)
Albumin: 4.4 g/dL (ref 3.5–5.0)
Alkaline Phosphatase: 75 U/L (ref 38–126)
Anion gap: 18 — ABNORMAL HIGH (ref 5–15)
BUN: 19 mg/dL (ref 6–20)
CHLORIDE: 95 mmol/L — AB (ref 98–111)
CO2: 21 mmol/L — AB (ref 22–32)
Calcium: 9.7 mg/dL (ref 8.9–10.3)
Creatinine, Ser: 0.87 mg/dL (ref 0.44–1.00)
GFR calc non Af Amer: >60 mL/min (ref >60)
Glucose, Bld: 288 mg/dL — ABNORMAL HIGH (ref 70–99)
POTASSIUM: 4.4 mmol/L (ref 3.5–5.1)
Sodium: 134 mmol/L — ABNORMAL LOW (ref 135–145)
Total Bilirubin: 1.3 mg/dL — ABNORMAL HIGH (ref 0.3–1.2)
Total Protein: 8.6 g/dL — ABNORMAL HIGH (ref 6.5–8.1)

## 2018-09-15 LAB — URINALYSIS, ROUTINE W REFLEX MICROSCOPIC
Bacteria, UA: NONE SEEN
Bilirubin Urine: NEGATIVE
Glucose, UA: >=500 mg/dL — AB
Ketones, ur: 80 mg/dL — AB
Nitrite: NEGATIVE
Protein, ur: 100 mg/dL — AB
Specific Gravity, Urine: 1.033 — ABNORMAL HIGH (ref 1.005–1.030)
pH: 5 (ref 5.0–8.0)

## 2018-09-15 LAB — I-STAT CHEM 8, ED
BUN: 24 mg/dL — ABNORMAL HIGH (ref 6–20)
Calcium, Ion: 1.09 mmol/L — ABNORMAL LOW (ref 1.15–1.40)
Chloride: 100 mmol/L (ref 98–111)
Creatinine, Ser: 0.5 mg/dL (ref 0.44–1.00)
Glucose, Bld: 296 mg/dL — ABNORMAL HIGH (ref 70–99)
HCT: 53 % — ABNORMAL HIGH (ref 36.0–46.0)
Hemoglobin: 18 g/dL — ABNORMAL HIGH (ref 12.0–15.0)
Potassium: 4.1 mmol/L (ref 3.5–5.1)
Sodium: 135 mmol/L (ref 135–145)
TCO2: 26 mmol/L (ref 22–32)

## 2018-09-15 LAB — I-STAT VENOUS BLOOD GAS, ED
ACID-BASE DEFICIT: 3 mmol/L — AB (ref 0.0–2.0)
Bicarbonate: 22.4 mmol/L (ref 20.0–28.0)
O2 SAT: 87 %
Patient temperature: 37
TCO2: 24 mmol/L (ref 22–32)
pCO2, Ven: 40.6 mmHg — ABNORMAL LOW (ref 44.0–60.0)
pH, Ven: 7.349 (ref 7.250–7.430)
pO2, Ven: 56 mmHg — ABNORMAL HIGH (ref 32.0–45.0)

## 2018-09-15 LAB — BASIC METABOLIC PANEL
Anion gap: 14 (ref 5–15)
Anion gap: 12 (ref 5–15)
BUN: 20 mg/dL (ref 6–20)
BUN: 19 mg/dL (ref 6–20)
CO2: 20 mmol/L — AB (ref 22–32)
CO2: 21 mmol/L — ABNORMAL LOW (ref 22–32)
Calcium: 8.8 mg/dL — ABNORMAL LOW (ref 8.9–10.3)
Calcium: 8.7 mg/dL — ABNORMAL LOW (ref 8.9–10.3)
Chloride: 98 mmol/L (ref 98–111)
Chloride: 97 mmol/L — ABNORMAL LOW (ref 98–111)
Creatinine, Ser: 0.83 mg/dL (ref 0.44–1.00)
Creatinine, Ser: 0.79 mg/dL (ref 0.44–1.00)
GFR calc Af Amer: >60 mL/min (ref >60)
GFR calc Af Amer: >60 mL/min (ref >60)
GFR calc non Af Amer: >60 mL/min (ref >60)
GFR calc non Af Amer: >60 mL/min (ref >60)
GLUCOSE: 324 mg/dL — AB (ref 70–99)
GLUCOSE: 383 mg/dL — AB (ref 70–99)
Potassium: 4.3 mmol/L (ref 3.5–5.1)
Potassium: 4.5 mmol/L (ref 3.5–5.1)
Sodium: 132 mmol/L — ABNORMAL LOW (ref 135–145)
Sodium: 130 mmol/L — ABNORMAL LOW (ref 135–145)

## 2018-09-15 LAB — I-STAT BETA HCG BLOOD, ED (MC, WL, AP ONLY): I-stat hCG, quantitative: <5 m[IU]/mL (ref <5)

## 2018-09-15 LAB — RAPID URINE DRUG SCREEN, HOSP PERFORMED
Amphetamines: NOT DETECTED
Barbiturates: NOT DETECTED
Benzodiazepines: NOT DETECTED
Cocaine: POSITIVE — AB
Opiates: NOT DETECTED
Tetrahydrocannabinol: POSITIVE — AB

## 2018-09-15 LAB — LIPASE, BLOOD: LIPASE: 21 U/L (ref 11–51)

## 2018-09-15 LAB — CBG MONITORING, ED
GLUCOSE-CAPILLARY: 290 mg/dL — AB (ref 70–99)
GLUCOSE-CAPILLARY: 325 mg/dL — AB (ref 70–99)
Glucose-Capillary: 236 mg/dL — ABNORMAL HIGH (ref 70–99)

## 2018-09-15 LAB — I-STAT CG4 LACTIC ACID, ED
Lactic Acid, Venous: 2.39 mmol/L (ref 0.5–1.9)
Lactic Acid, Venous: 1.73 mmol/L (ref 0.5–1.9)

## 2018-09-15 MED ORDER — SODIUM CHLORIDE 0.45 % IV SOLN: INTRAVENOUS | Status: DC

## 2018-09-15 MED ORDER — SODIUM CHLORIDE 0.9 % IV BOLUS
2000.0000 mL | Freq: Once | INTRAVENOUS | Status: AC
  Administered 2018-09-15: 2000 mL via INTRAVENOUS

## 2018-09-15 MED ORDER — IPRATROPIUM-ALBUTEROL 0.5-2.5 (3) MG/3ML IN SOLN
3.0000 mL | Freq: Once | RESPIRATORY_TRACT | Status: AC
  Administered 2018-09-15: 3 mL via RESPIRATORY_TRACT
  Filled 2018-09-15: qty 3

## 2018-09-15 MED ORDER — MENTHOL 3 MG MT LOZG: 1.0000 | LOZENGE | OROMUCOSAL | Status: DC | PRN

## 2018-09-15 MED ORDER — ZOLPIDEM TARTRATE 5 MG PO TABS: 5.0000 mg | ORAL_TABLET | Freq: Every evening | ORAL | Status: DC | PRN

## 2018-09-15 MED ORDER — ENOXAPARIN SODIUM 40 MG/0.4ML ~~LOC~~ SOLN
40.0000 mg | SUBCUTANEOUS | Status: DC
  Administered 2018-09-16: 40 mg via SUBCUTANEOUS
  Filled 2018-09-15: qty 0.4

## 2018-09-15 MED ORDER — DOCUSATE SODIUM 50 MG/5ML PO LIQD
10.0000 mg | Freq: Once | ORAL | Status: AC
  Administered 2018-09-16: 10 mg via OTIC
  Filled 2018-09-15 (×2): qty 10

## 2018-09-15 MED ORDER — ACETAMINOPHEN 325 MG PO TABS
650.0000 mg | ORAL_TABLET | Freq: Four times a day (QID) | ORAL | Status: DC | PRN
  Administered 2018-09-16: 650 mg via ORAL
  Filled 2018-09-15: qty 2

## 2018-09-15 MED ORDER — DM-GUAIFENESIN ER 30-600 MG PO TB12: 1.0000 | ORAL_TABLET | Freq: Two times a day (BID) | ORAL | Status: DC | PRN

## 2018-09-15 MED ORDER — INSULIN REGULAR(HUMAN) IN NACL 100-0.9 UT/100ML-% IV SOLN
INTRAVENOUS | Status: DC
  Administered 2018-09-15: 2.7 [IU]/h via INTRAVENOUS
  Filled 2018-09-15: qty 100

## 2018-09-15 MED ORDER — PANTOPRAZOLE SODIUM 40 MG PO TBEC
40.0000 mg | DELAYED_RELEASE_TABLET | Freq: Every day | ORAL | Status: DC
  Administered 2018-09-16: 40 mg via ORAL
  Filled 2018-09-15: qty 1

## 2018-09-15 MED ORDER — ONDANSETRON HCL 4 MG/2ML IJ SOLN
4.0000 mg | Freq: Three times a day (TID) | INTRAMUSCULAR | Status: DC | PRN
Start: 2018-09-15 — End: 2018-09-16
  Administered 2018-09-15: 4 mg via INTRAVENOUS
  Filled 2018-09-15: qty 2

## 2018-09-15 MED ORDER — ALUM & MAG HYDROXIDE-SIMETH 200-200-20 MG/5ML PO SUSP: 30.0000 mL | Freq: Four times a day (QID) | ORAL | Status: DC | PRN

## 2018-09-15 MED ORDER — POTASSIUM CHLORIDE 10 MEQ/100ML IV SOLN
10.0000 meq | INTRAVENOUS | Status: AC
  Administered 2018-09-15 (×2): 10 meq via INTRAVENOUS
  Filled 2018-09-15 (×2): qty 100

## 2018-09-15 MED ORDER — ONDANSETRON HCL 4 MG/2ML IJ SOLN
4.0000 mg | Freq: Once | INTRAMUSCULAR | Status: AC
  Administered 2018-09-15: 4 mg via INTRAVENOUS
  Filled 2018-09-15: qty 2

## 2018-09-15 MED ORDER — NICOTINE 21 MG/24HR TD PT24
21.0000 mg | MEDICATED_PATCH | Freq: Every day | TRANSDERMAL | Status: DC
  Administered 2018-09-16 (×2): 21 mg via TRANSDERMAL
  Filled 2018-09-15 (×2): qty 1

## 2018-09-15 MED ORDER — DEXTROSE-NACL 5-0.45 % IV SOLN: INTRAVENOUS | Status: DC

## 2018-09-15 MED ORDER — INSULIN GLARGINE 100 UNIT/ML ~~LOC~~ SOLN
18.0000 [IU] | Freq: Every day | SUBCUTANEOUS | Status: DC
  Administered 2018-09-16: 18 [IU] via SUBCUTANEOUS
  Filled 2018-09-15: qty 0.18

## 2018-09-15 MED ORDER — GABAPENTIN 100 MG PO CAPS
200.0000 mg | ORAL_CAPSULE | Freq: Every day | ORAL | Status: DC
  Administered 2018-09-16: 200 mg via ORAL
  Filled 2018-09-15: qty 2

## 2018-09-15 MED ORDER — LEVOFLOXACIN 500 MG PO TABS
500.0000 mg | ORAL_TABLET | Freq: Every day | ORAL | Status: DC
  Administered 2018-09-15: 500 mg via ORAL
  Filled 2018-09-15 (×2): qty 1

## 2018-09-15 MED ORDER — LACTATED RINGERS IV BOLUS
2000.0000 mL | Freq: Once | INTRAVENOUS | Status: AC
  Administered 2018-09-15: 1000 mL via INTRAVENOUS

## 2018-09-15 MED ORDER — LINACLOTIDE 145 MCG PO CAPS
290.0000 ug | ORAL_CAPSULE | Freq: Every day | ORAL | Status: DC
  Administered 2018-09-16: 290 ug via ORAL
  Filled 2018-09-15: qty 2

## 2018-09-15 MED ORDER — SENNOSIDES-DOCUSATE SODIUM 8.6-50 MG PO TABS: 1.0000 | ORAL_TABLET | Freq: Every evening | ORAL | Status: DC | PRN

## 2018-09-15 MED ORDER — INSULIN ASPART 100 UNIT/ML ~~LOC~~ SOLN
0.0000 [IU] | Freq: Three times a day (TID) | SUBCUTANEOUS | Status: DC
  Administered 2018-09-16: 5 [IU] via SUBCUTANEOUS
  Administered 2018-09-16: 18 [IU] via SUBCUTANEOUS

## 2018-09-15 MED ORDER — ALBUTEROL SULFATE (2.5 MG/3ML) 0.083% IN NEBU: 2.5000 mg | INHALATION_SOLUTION | RESPIRATORY_TRACT | Status: DC | PRN

## 2018-09-15 MED ORDER — SODIUM CHLORIDE 0.9 % IV SOLN
INTRAVENOUS | Status: DC
  Administered 2018-09-15 – 2018-09-16 (×2): via INTRAVENOUS

## 2018-09-15 NOTE — ED Notes (Signed)
Lab notified of add on of UDS to urine specimen.

## 2018-09-15 NOTE — ED Triage Notes (Signed)
Patient reports hyperglycemia (reading 600 at home this am) along with shortness of breath, weakness, fevers and vomiting x 3 days. Patient adds fever of this am and took 3 tylenols. States she just completed a course of antibiotics 3 days ago for an ear infection but states pain is now worse in her left ear.

## 2018-09-15 NOTE — ED Provider Notes (Signed)
[facility] EMERGENCY DEPARTMENT Provider Note   CSN: [id number] Arrival date & time: 09/15/18  1628     History   Chief Complaint Chief Complaint  Patient presents with  . Emesis  . Shortness of Breath    HPI [name] is a 32 y.o. female.  32 yo F with a chief complaint of nausea vomiting and shortness of breath.  Going on for the past 3 or 4 days.  No noted sick contacts.  Has been having trouble eating and drinking.  Is also had trouble with her blood sugars have been very high and very low.  Blood sugar this afternoon was greater than 600 and she took some insulin prior.  Some subjective fevers and chills.  Complaining of left ear pain and just finished a course of antibiotics she is not sure what she was taking.  Pharmacy tech reviewed this and it seems that with Ceftin  she finished it about a week ago.  She denies dysuria increased frequency or hesitancy.  Has some mild epigastric pain with vomiting.  She has a chronic cough and feel that it is unchanged.  Feels mildly short of breath on exertion.  The history is provided by the patient.  Emesis   Associated symptoms include abdominal pain. Pertinent negatives include no arthralgias, no chills, no fever, no headaches and no myalgias.  Shortness of Breath  Associated symptoms include ear pain, vomiting and abdominal pain. Pertinent negatives include no fever, no headaches, no rhinorrhea, no wheezing and no chest pain.  Illness  This is a new problem. The current episode started 2 days ago. The problem occurs constantly. The problem has been gradually worsening. Associated symptoms include abdominal pain. Pertinent negatives include no chest pain, no headaches and no shortness of breath. Nothing aggravates the symptoms. Nothing relieves the symptoms. She has tried nothing for the symptoms. The treatment provided no relief.    Past Medical History:  Diagnosis Date  . Diabetes mellitus   . DKA  05/01/2011  . Polysubstance abuse [facility])     Patient Active Problem List   Diagnosis Date Noted  . Polysubstance abuse (HCC) 09/15/2018  . Tobacco abuse 09/15/2018  . Cough 09/15/2018  . Protein-calorie malnutrition, severe (HCC) 09/15/2018  . Acute lower UTI 07/14/2016  . Panic disorder without agoraphobia with moderate panic attacks 01/20/2016  . Benzodiazepine abuse (HCC) 01/20/2016  . Substance induced mood disorder (HCC) 01/19/2016  . DKA 05/01/2011  . Diabetes mellitus type 1, uncontrolled (HCC) 05/01/2011  . Leukocytosis 05/01/2011    Past Surgical History:  Procedure Laterality Date  . TUBAL LIGATION    . WOUND EXPLORATION Left 07/26/2014   Procedure: Irrigation, debridement and exploration of elbow wounds;  Surgeon: Betha Loa, MD;  Location: [facility] OR;  Service: Orthopedics;  Laterality: Left;     OB History    Gravida  2   Para  2   Term      Preterm      AB      Living        SAB      TAB      Ectopic      Multiple      Live Births               Home Medications    Prior to Admission medications   Medication Sig Start Date End Date Taking? Authorizing Provider  acetaminophen (TYLENOL) 500 MG tablet Take 1,500 mg by mouth every 6 (six)  hours as needed for mild pain, moderate pain or fever.    Yes [provider]  gabapentin (NEURONTIN) 100 MG capsule Take 200 mg by mouth at bedtime. 08/24/18  Yes [provider]  linaclotide (LINZESS) 290 MCG CAPS capsule Take 290 mcg by mouth daily before breakfast.   Yes [provider]  Naphazoline HCl (CLEAR EYES OP) Place 1 drop into both eyes daily as needed (dry eyes/ itching/redness).   Yes [provider]  omeprazole (PRILOSEC) 40 MG capsule Take 40 mg by mouth daily before breakfast.    Yes [provider]  ondansetron (ZOFRAN) 4 MG tablet Take 1 tablet (4 mg total) by mouth every 6 (six) hours. Patient taking differently: Take 4 mg by mouth every 6 (six)  hours as needed for nausea or vomiting.  03/20/18  Yes Hedges, Tinnie Gens, PA-C  cefdinir (OMNICEF) 300 MG capsule Take 300 mg by mouth 2 (two) times daily. 10 day course completed on or about 08/31/18 08/21/18   [provider]  insulin aspart (NOVOLOG FLEXPEN) 100 UNIT/ML FlexPen Inject into the skin See admin instructions. Inject 2 units per 15 carbs into the skin three times a day with meals, per sliding scale    [provider]  Insulin Glargine (LANTUS SOLOSTAR) 100 UNIT/ML Solostar Pen Inject 35 Units into the skin daily at 10 pm. Patient taking differently: Inject 35 Units into the skin at bedtime.  07/15/16   Lonia Blood, MD  metroNIDAZOLE (FLAGYL) 500 MG tablet Take 1 tablet (500 mg total) by mouth 2 (two) times daily. 03/20/18   Hedges, Tinnie Gens, PA-C  ranitidine (ZANTAC) 150 MG tablet Take 150 mg by mouth 2 (two) times daily.    [provider]    Family History Family History  Problem Relation Age of Onset  . Coronary artery disease Mother   . Lung cancer Mother   . Heart attack Father 58  . Thyroid disease Maternal Grandmother   . Diabetes type I Daughter     Social History Social History   Tobacco Use  . Smoking status: Current Every Day Smoker    Packs/day: 1.00    Years: 15.00    Pack years: 15.00    Types: Cigarettes  . Smokeless tobacco: Never Used  . Tobacco comment: She is currently trying to quit.  Substance Use Topics  . Alcohol use: No  . Drug use: No    Comment: HX of cocaine and marijuana use     Allergies   Codeine; Penicillins; and Tramadol   Review of Systems Review of Systems  Constitutional: Negative for chills and fever.  HENT: Positive for ear pain. Negative for congestion and rhinorrhea.   Eyes: Negative for redness and visual disturbance.  Respiratory: Negative for shortness of breath and wheezing.   Cardiovascular: Negative for chest pain and palpitations.  Gastrointestinal: Positive for abdominal pain, nausea  and vomiting.  Genitourinary: Negative for dysuria and urgency.  Musculoskeletal: Negative for arthralgias and myalgias.  Skin: Negative for pallor and wound.  Neurological: Negative for dizziness and headaches.     Physical Exam Updated Vital Signs BP 113/66   Pulse 98   Temp 97.8 F (36.6 C) (Oral)   Resp 19   Ht 5' (1.524 m)   Wt 43.1 kg   LMP 09/08/2018   SpO2 97%   BMI 18.55 kg/m   Physical Exam  Constitutional: She is oriented to person, place, and time. She appears well-developed and well-nourished. No distress.  Cachectic  HENT:  Head: Normocephalic and atraumatic.  Swollen turbinates, posterior nasal drip, no noted sinus ttp, tm normal on the right, left TM is obscured by wax  Eyes: Pupils are equal, round, and reactive to light. EOM are normal.  Neck: Normal range of motion. Neck supple.  Cardiovascular: Regular rhythm. Tachycardia present. Exam reveals no gallop and no friction rub.  No murmur heard. Pulmonary/Chest: Effort normal. She has no wheezes. She has no rales.  Diminished breath sounds in all fields  Abdominal: Soft. She exhibits no distension. There is no tenderness. There is no rebound and no guarding.  Musculoskeletal: She exhibits no edema or tenderness.  Neurological: She is alert and oriented to person, place, and time.  Skin: Skin is warm and dry. She is not diaphoretic.  Psychiatric: She has a normal mood and affect. Her behavior is normal.  Nursing note and vitals reviewed.    ED Treatments / Results  Labs (all labs ordered are listed, but only abnormal results are displayed) Labs Reviewed  CBC WITH DIFFERENTIAL/PLATELET - Abnormal; Notable for the following components:      Result Value   RBC 5.79 (*)    Hemoglobin 16.5 (*)    HCT 51.5 (*)    All other components within normal limits  COMPREHENSIVE METABOLIC PANEL - Abnormal; Notable for the following components:   Sodium 134 (*)    Chloride 95 (*)    CO2 21 (*)    Glucose, Bld  288 (*)    Total Protein 8.6 (*)    Total Bilirubin 1.3 (*)    Anion gap 18 (*)    All other components within normal limits  URINALYSIS, ROUTINE W REFLEX MICROSCOPIC - Abnormal; Notable for the following components:   APPearance HAZY (*)    Specific Gravity, Urine 1.033 (*)    Glucose, UA >=500 (*)    Hgb urine dipstick SMALL (*)    Ketones, ur 80 (*)    Protein, ur 100 (*)    Leukocytes, UA SMALL (*)    All other components within normal limits  CBG MONITORING, ED - Abnormal; Notable for the following components:   Glucose-Capillary 290 (*)    All other components within normal limits  I-STAT CHEM 8, ED - Abnormal; Notable for the following components:   BUN 24 (*)    Glucose, Bld 296 (*)    Calcium, Ion 1.09 (*)    Hemoglobin 18.0 (*)    HCT 53.0 (*)    All other components within normal limits  I-STAT CG4 LACTIC ACID, ED - Abnormal; Notable for the following components:   Lactic Acid, Venous 2.39 (*)    All other components within normal limits  I-STAT VENOUS BLOOD GAS, ED - Abnormal; Notable for the following components:   pCO2, Ven 40.6 (*)    pO2, Ven 56.0 (*)    Acid-base deficit 3.0 (*)    All other components within normal limits  CBG MONITORING, ED - Abnormal; Notable for the following components:   Glucose-Capillary 325 (*)    All other components within normal limits  URINE CULTURE  CULTURE, BLOOD (ROUTINE X 2)  CULTURE, BLOOD (ROUTINE X 2)  CULTURE, GROUP A STREP ([facility])  LIPASE, BLOOD  HIV ANTIBODY (ROUTINE TESTING W REFLEX)  BASIC METABOLIC PANEL  BASIC METABOLIC PANEL  BASIC METABOLIC PANEL  BASIC METABOLIC PANEL  RAPID URINE DRUG SCREEN, HOSP PERFORMED  INFLUENZA PANEL BY PCR (TYPE A & B)  I-STAT BETA HCG BLOOD, ED ([facility], [facility], [facility]  ONLY)  I-STAT CG4 LACTIC ACID, ED    EKG EKG Interpretation  Date/Time:  Saturday September 15 2018 16:46:18 EST Ventricular Rate:  101 PR Interval:    QRS Duration: 88 QT Interval:  357 QTC Calculation: 463 R  Axis:   86 Text Interpretation:  Sinus tachycardia Right atrial enlargement No significant change since last tracing Confirmed by Melene Plan [phone]) on 09/15/2018 5:17:25 PM   Radiology Dg Chest 2 View  Result Date: 09/15/2018 CLINICAL DATA:  Hypoglycemia which shortness-of-breath, weakness, fever and vomiting 3 days. EXAM: CHEST - 2 VIEW COMPARISON:  05/26/2018 FINDINGS: Lungs are adequately inflated without consolidation or effusion. Cardiomediastinal silhouette, bones and soft tissues are normal. IMPRESSION: No active cardiopulmonary disease. Electronically Signed   By: Elberta Fortis M.D.   On: 09/15/2018 17:27    Procedures Procedures (including critical care time)  Medications Ordered in ED Medications  potassium chloride 10 mEq in 100 mL IVPB (10 mEq Intravenous New Bag/Given 09/15/18 2037)  dextrose 5 %-0.45 % sodium chloride infusion (has no administration in time range)  docusate (COLACE) 50 MG/5ML liquid 10 mg (has no administration in time range)  ondansetron (ZOFRAN) injection 4 mg (has no administration in time range)  sodium chloride 0.9 % bolus 2,000 mL (2,000 mLs Intravenous New Bag/Given 09/15/18 2054)  insulin regular, human (MYXREDLIN) 100 units/ 100 mL infusion (has no administration in time range)  enoxaparin (LOVENOX) injection 40 mg (has no administration in time range)  0.9 %  sodium chloride infusion (has no administration in time range)  nicotine (NICODERM CQ - dosed in mg/24 hours) patch 21 mg (has no administration in time range)  dextromethorphan-guaiFENesin (MUCINEX DM) 30-600 MG per 12 hr tablet 1 tablet (has no administration in time range)  levofloxacin (LEVAQUIN) tablet 500 mg (has no administration in time range)  alum & mag hydroxide-simeth (MAALOX/MYLANTA) 200-200-20 MG/5ML suspension 30 mL (has no administration in time range)  albuterol (PROVENTIL) (2.5 MG/3ML) 0.083% nebulizer solution 2.5 mg (has no administration in time range)  senna-docusate  (Senokot-S) tablet 1 tablet (has no administration in time range)  lactated ringers bolus 2,000 mL (0 mLs Intravenous Stopped 09/15/18 1815)  ipratropium-albuterol (DUONEB) 0.5-2.5 (3) MG/3ML nebulizer solution 3 mL (3 mLs Nebulization Given 09/15/18 1747)  ondansetron (ZOFRAN) injection 4 mg (4 mg Intravenous Given 09/15/18 1817)     Initial Impression / Assessment and Plan / ED Course  I have reviewed the triage vital signs and the nursing notes.  Pertinent labs & imaging results that were available during my care of the patient were reviewed by me and considered in my medical decision making (see chart for details).     32 yo F with a chief complaint of URI-like illness.  Going on for the past 3 days.  She is also been having some nausea and vomiting.  She has a benign abdominal exam for me.  She is tachycardic to the 1 teens, will give a fluid bolus give nausea medicine given DuoNeb reassess.  The patient is feeling somewhat better, her tachycardia has resolved.  Her CMP has an anion gap of 18 with slight acidosis of 21.  Her glucose is 288.  She is greater than 80 ketones.  I wonder if the patient is in DKA, though it appears to be just be mild.  She still is not completely back to her baseline and I suspect has an upper respiratory illness that caused her DKA which is not resolved.  Will start on insulin drip and  discussed with the hospitalist.  CRITICAL CARE Performed by: Rae Roam   Total critical care time: 35 minutes  Critical care time was exclusive of separately billable procedures and treating other patients.  Critical care was necessary to treat or prevent imminent or life-threatening deterioration.  Critical care was time spent personally by me on the following activities: development of treatment plan with patient and/or surrogate as well as nursing, discussions with consultants, evaluation of patient's response to treatment, examination of patient, obtaining  history from patient or surrogate, ordering and performing treatments and interventions, ordering and review of laboratory studies, ordering and review of radiographic studies, pulse oximetry and re-evaluation of patient's condition.  The patients results and plan were reviewed and discussed.   Any x-rays performed were independently reviewed by myself.   Differential diagnosis were considered with the presenting HPI.  Medications  potassium chloride 10 mEq in 100 mL IVPB (10 mEq Intravenous New Bag/Given 09/15/18 2037)  dextrose 5 %-0.45 % sodium chloride infusion (has no administration in time range)  docusate (COLACE) 50 MG/5ML liquid 10 mg (has no administration in time range)  ondansetron (ZOFRAN) injection 4 mg (has no administration in time range)  sodium chloride 0.9 % bolus 2,000 mL (2,000 mLs Intravenous New Bag/Given 09/15/18 2054)  insulin regular, human (MYXREDLIN) 100 units/ 100 mL infusion (has no administration in time range)  enoxaparin (LOVENOX) injection 40 mg (has no administration in time range)  0.9 %  sodium chloride infusion (has no administration in time range)  nicotine (NICODERM CQ - dosed in mg/24 hours) patch 21 mg (has no administration in time range)  dextromethorphan-guaiFENesin (MUCINEX DM) 30-600 MG per 12 hr tablet 1 tablet (has no administration in time range)  levofloxacin (LEVAQUIN) tablet 500 mg (has no administration in time range)  alum & mag hydroxide-simeth (MAALOX/MYLANTA) 200-200-20 MG/5ML suspension 30 mL (has no administration in time range)  albuterol (PROVENTIL) (2.5 MG/3ML) 0.083% nebulizer solution 2.5 mg (has no administration in time range)  senna-docusate (Senokot-S) tablet 1 tablet (has no administration in time range)  lactated ringers bolus 2,000 mL (0 mLs Intravenous Stopped 09/15/18 1815)  ipratropium-albuterol (DUONEB) 0.5-2.5 (3) MG/3ML nebulizer solution 3 mL (3 mLs Nebulization Given 09/15/18 1747)  ondansetron (ZOFRAN) injection  4 mg (4 mg Intravenous Given 09/15/18 1817)    Vitals:   09/15/18 1730 09/15/18 1800 09/15/18 1815 09/15/18 1830  BP: 106/79 114/76 100/68 113/66  Pulse: 91 98 96 98  Resp: (!) 21 (!) 24 (!) 21 19  Temp:      TempSrc:      SpO2: 100% 99% 99% 97%  Weight:      Height:        Final diagnoses:  Diabetic ketoacidosis without coma associated with type 1 diabetes mellitus (HCC)    Admission/ observation were discussed with the admitting physician, patient and/or family and they are comfortable with the plan.   Final Clinical Impressions(s) / ED Diagnoses   Final diagnoses:  Diabetic ketoacidosis without coma associated with type 1 diabetes mellitus [facility])    ED Discharge Orders    None       Melene Plan, DO 09/15/18 2131

## 2018-09-15 NOTE — ED Notes (Signed)
Pt seen walking out "to go smoke" pt informed that she is not able to leave unless she is leaving AMA. Pt continues to walk out. This RN and another RN followed pt to lobby to inform security, security escorted pt back into ED

## 2018-09-15 NOTE — H&P (Addendum)
History and Physical    [name] WUX:[id number] DOB: [birth year] DOA: 09/15/2018  Referring MD/NP/PA:   PCP: [facility]   Patient coming from:  The patient is coming from home.  At baseline, pt is independent for most of ADL.        Chief Complaint: Generalized weakness, nausea, vomiting, cough, sore throat  HPI: [name] is a 32 y.o. female with medical history significant of poorly controlled type 1 diabetes, polysubstance abuse, tobacco abuse, DKA, GERD, who presents with generalized weakness, nausea, vomiting, cough, sore throat.  Patient states that she has been feeling bad in the past 3 days.  She reports generalized weakness, nausea, vomiting, cough and sore throat.  She has nonbilious and nonbloody vomitus, more than 15 times today.  She also has mild diffuse abdominal pain.  No diarrhea.  She reports subjective fever and chills, but her temperature is 97.8 in ED.  She ha cough with yellow-colored sputum production.  She has mild shortness of breath, but no chest pain.  She has increased urinary frequency, burning on urination sometimes, but no dysuria or hematuria.  She states that she is taking her insulin consistently, but her blood sugar is elevated>600 at home.  Patient states that she just completed antibiotics for ear infection 3 days ago, still has medication improved to left ear pain.  No active draining.  ED Course: pt was found to have DKA (blood sugar 296, bicarbonate of 21, anion gap 18, positive ketones in urine analysis), WBC 9.8, positive urinalysis (hazy appearance, small amount of leukocyte, no bacteria, WBC 21-50), elevated lactic acid, 2.39-->1.73, creatinine normal, temperature normal, tachycardia, tachypnea, oxygen saturation 97% on room air.  Chest x-ray negative.  Patient is placed on stepdown bed for observation.  Review of Systems:   General: Has subjective fevers, chills, no body weight gain, has poor appetite, has fatigue HEENT:  no blurry vision, hearing changes or sore throat Respiratory: has dyspnea, coughing, no wheezing CV: no chest pain, no palpitations GI: has nausea, vomiting, abdominal pain, no diarrhea, has constipation GU: no dysuria, has burning on urination, increased urinary frequency, no hematuria  Ext: no leg edema Neuro: no unilateral weakness, numbness, or tingling, no vision change or hearing loss Skin: no rash, no skin tear. MSK: No muscle spasm, no deformity, no limitation of range of movement in spin Heme: No easy bruising.  Travel history: No recent long distant travel.  Allergy:  Allergies  Allergen Reactions  . Codeine Anaphylaxis and Nausea And Vomiting  . Penicillins Anaphylaxis and Nausea And Vomiting    Has patient had a PCN reaction causing immediate rash, facial/tongue/throat swelling, SOB or lightheadedness with hypotension: Yes Has patient had a PCN reaction causing severe rash involving mucus membranes or skin necrosis: Yes Has patient had a PCN reaction that required hospitalization Yes Has patient had a PCN reaction occurring within the last 10 years: No If all of the above answers are "NO", then may proceed with Cephalosporin use.   . Tramadol Itching    Past Medical History:  Diagnosis Date  . Diabetes mellitus   . DKA 05/01/2011  . Polysubstance abuse [facility])     Past Surgical History:  Procedure Laterality Date  . TUBAL LIGATION    . WOUND EXPLORATION Left 07/26/2014   Procedure: Irrigation, debridement and exploration of elbow wounds;  Surgeon: Betha Loa, MD;  Location: [facility] OR;  Service: Orthopedics;  Laterality: Left;    Social History:  reports that she has been smoking cigarettes.  She has a 15.00 pack-year smoking history. She has never used smokeless tobacco. She reports that she does not drink alcohol or use drugs.  Family History:  Family History  Problem Relation Age of Onset  . Coronary artery disease Mother   . Lung cancer Mother   . Heart attack  Father 64  . Thyroid disease Maternal Grandmother   . Diabetes type I Daughter      Prior to Admission medications   Medication Sig Start Date End Date Taking? Authorizing Provider  acetaminophen (TYLENOL) 500 MG tablet Take 1,500 mg by mouth every 6 (six) hours as needed for mild pain or moderate pain.     [provider]  cephALEXin (KEFLEX) 500 MG capsule Take 1 capsule (500 mg total) by mouth 2 (two) times daily. 03/20/18   Hedges, Tinnie Gens, PA-C  insulin aspart (NOVOLOG FLEXPEN) 100 UNIT/ML FlexPen Inject into the skin See admin instructions. Inject 2 units per 15 carbs into the skin three times a day with meals, per sliding scale    [provider]  Insulin Glargine (LANTUS SOLOSTAR) 100 UNIT/ML Solostar Pen Inject 35 Units into the skin daily at 10 pm. Patient taking differently: Inject 35 Units into the skin at bedtime.  07/15/16   Lonia Blood, MD  metroNIDAZOLE (FLAGYL) 500 MG tablet Take 1 tablet (500 mg total) by mouth 2 (two) times daily. 03/20/18   Hedges, Tinnie Gens, PA-C  omeprazole (PRILOSEC) 40 MG capsule Take 40 mg by mouth 2 (two) times daily.    [provider]  ondansetron (ZOFRAN) 4 MG tablet Take 1 tablet (4 mg total) by mouth every 6 (six) hours. 03/20/18   Hedges, Tinnie Gens, PA-C  ranitidine (ZANTAC) 150 MG tablet Take 150 mg by mouth 2 (two) times daily.    [provider]    Physical Exam: Vitals:   09/15/18 1730 09/15/18 1800 09/15/18 1815 09/15/18 1830  BP: 106/79 114/76 100/68 113/66  Pulse: 91 98 96 98  Resp: (!) 21 (!) 24 (!) 21 19  Temp:      TempSrc:      SpO2: 100% 99% 99% 97%  Weight:      Height:       General: Not in acute distress. Very thin body habitus HEENT:       Eyes: PERRL, EOMI, no scleral icterus.       ENT: No discharge from the ears and nose, no pharynx injection, no tonsillar enlargement.        Neck: No JVD, no bruit, no mass felt. Heme: No neck lymph node enlargement. Cardiac: S1/S2, RRR, No murmurs,  No gallops or rubs. Respiratory: No rales, wheezing, rhonchi or rubs. GI: Soft, nondistended, has mild tender diffusely, no rebound pain, no organomegaly, BS present. GU: No hematuria Ext: No pitting leg edema bilaterally. 2+DP/PT pulse bilaterally. Musculoskeletal: No joint deformities, No joint redness or warmth, no limitation of ROM in spin. Skin: No rashes.  Neuro: Alert, oriented X3, cranial nerves II-XII grossly intact, moves all extremities normally. Psych: Patient is not psychotic, no suicidal or hemocidal ideation.  Labs on Admission: I have personally reviewed following labs and imaging studies  CBC: Recent Labs  Lab 09/15/18 1649 09/15/18 1700  WBC 9.8  --   NEUTROABS 6.8  --   HGB 16.5* 18.0*  HCT 51.5* 53.0*  MCV 88.9  --   PLT 320  --    Basic Metabolic Panel: Recent Labs  Lab 09/15/18 1649 09/15/18 1700 09/15/18 2042  NA 134*  135 132*  K 4.4 4.1 4.3  CL 95* 100 98  CO2 21*  --  20*  GLUCOSE 288* 296* 324*  BUN 19 24* 20  CREATININE 0.87 0.50 0.83  CALCIUM 9.7  --  8.8*   GFR: Estimated Creatinine Clearance: 65 mL/min (by C-G formula based on SCr of 0.83 mg/dL). Liver Function Tests: Recent Labs  Lab 09/15/18 1649  AST 18  ALT 18  ALKPHOS 75  BILITOT 1.3*  PROT 8.6*  ALBUMIN 4.4   Recent Labs  Lab 09/15/18 1649  LIPASE 21   No results for input(s): AMMONIA in the last 168 hours. Coagulation Profile: No results for input(s): INR, PROTIME in the last 168 hours. Cardiac Enzymes: No results for input(s): CKTOTAL, CKMB, CKMBINDEX, TROPONINI in the last 168 hours. BNP (last 3 results) No results for input(s): PROBNP in the last 8760 hours. HbA1C: No results for input(s): HGBA1C in the last 72 hours. CBG: Recent Labs  Lab 09/15/18 1639 09/15/18 2101  GLUCAP 290* 325*   Lipid Profile: No results for input(s): CHOL, HDL, LDLCALC, TRIG, CHOLHDL, LDLDIRECT in the last 72 hours. Thyroid Function Tests: No results for input(s): TSH,  T4TOTAL, FREET4, T3FREE, THYROIDAB in the last 72 hours. Anemia Panel: No results for input(s): VITAMINB12, FOLATE, FERRITIN, TIBC, IRON, RETICCTPCT in the last 72 hours. Urine analysis:    Component Value Date/Time   COLORURINE YELLOW 09/15/2018 1843   APPEARANCEUR HAZY (A) 09/15/2018 1843   LABSPEC 1.033 (H) 09/15/2018 1843   PHURINE 5.0 09/15/2018 1843   GLUCOSEU >=500 (A) 09/15/2018 1843   HGBUR SMALL (A) 09/15/2018 1843   BILIRUBINUR NEGATIVE 09/15/2018 1843   KETONESUR 80 (A) 09/15/2018 1843   PROTEINUR 100 (A) 09/15/2018 1843   UROBILINOGEN 1.0 07/28/2015 1402   NITRITE NEGATIVE 09/15/2018 1843   LEUKOCYTESUR SMALL (A) 09/15/2018 1843   Sepsis Labs: @LABRCNTIP (procalcitonin:4,lacticidven:4) )No results found for this or any previous visit (from the past 240 hour(s)).   Radiological Exams on Admission: Dg Chest 2 View  Result Date: 09/15/2018 CLINICAL DATA:  Hypoglycemia which shortness-of-breath, weakness, fever and vomiting 3 days. EXAM: CHEST - 2 VIEW COMPARISON:  05/26/2018 FINDINGS: Lungs are adequately inflated without consolidation or effusion. Cardiomediastinal silhouette, bones and soft tissues are normal. IMPRESSION: No active cardiopulmonary disease. Electronically Signed   By: Elberta Fortis M.D.   On: 09/15/2018 17:27     EKG: Independently reviewed.  Tachycardia, QTC 463, LAE, no ischemic change.    Assessment/Plan Principal Problem:   DKA Active Problems:   Diabetes mellitus type 1, uncontrolled (HCC)   Acute lower UTI   Nausea & vomiting   Polysubstance abuse (HCC)   Tobacco abuse   Cough   Protein-calorie malnutrition, severe (HCC)   GERD (gastroesophageal reflux disease)   DKA: blood sugar 296, bicarbonate of 21, anion gap 18, positive ketones in urine analysis.  Patient states that she has been taking insulin consistently.  This is likely triggered by infection, possible UTI  - Place on  stepdown for obs - 2L of NS bolus - start DKA protocol  with BMP q4h - IVF: NS 125 cc/h; will switch to D5-1/2NS when CBG<250 - replete K as needed - Zofran prn nausea  - NPO   Addendum: AG is closed.  -will stop insulin gtt -start lantus 18 U daily -SSI -continue IVF  Diabetes mellitus type 1, uncontrolled: Last A1c 10.9 on 03/29/18, poorly controled. Patient is taking NovoLog and Lantus -on DKA protocol  Polysubstance abuse and Tobacco abuse: -check  UDS -Nicotine patch -Did counseling about importance of quitting substance use.  Acute lower UTI:  -Levaquin (pt has penicillin allergy.  Will choose Levaquin to cover UTI, some upper respiratory symptoms, and left ear pain) -Follow-up urine culture, blood culture  Cough and sore throat: Chest x-ray negative. -Check flu PCR -Check rapid strep screen - On Levaquin as above -prn Cepacol  Nausea, vomiting, abdominal pain: Patient has mild diffuse abdominal tenderness on physical examination, but no guarding or rebound pain, no acute abdomen.  Unclear etiology.  Differential diagnosis including viral gastritis, DKA, UTI, gastroparesis -Supportive care - check Lipase - PRN Zofran for nausea -IV fluid as above  GERD: -Protonix  Protein-calorie malnutrition, severe (HCC): -Nutrition consult   DVT ppx:   SQ Lovenox Code Status: Full code Family Communication:   Yes, patient's husband at bed side Disposition Plan:  Anticipate discharge back to previous home environment Consults called: None Admission status:   SDU/obs  Date of Service 09/15/2018    Lorretta Harp [facility] Pager [phone]  If 7PM-7AM, please contact night-coverage [email or web address] Password [facility] 09/15/2018, 9:43 PM

## 2018-09-15 NOTE — ED Notes (Signed)
Patient transported to X-ray 

## 2018-09-16 ENCOUNTER — Encounter (HOSPITAL_COMMUNITY): Payer: Self-pay

## 2018-09-16 DIAGNOSIS — E1010 Type 1 diabetes mellitus with ketoacidosis without coma: Secondary | ICD-10-CM | POA: Diagnosis not present

## 2018-09-16 LAB — INFLUENZA PANEL BY PCR (TYPE A & B)
Influenza A By PCR: NEGATIVE
Influenza B By PCR: NEGATIVE

## 2018-09-16 LAB — HIV ANTIBODY (ROUTINE TESTING W REFLEX): HIV Screen 4th Generation wRfx: NONREACTIVE

## 2018-09-16 LAB — GLUCOSE, CAPILLARY: Glucose-Capillary: 263 mg/dL — ABNORMAL HIGH (ref 70–99)

## 2018-09-16 MED ORDER — LEVOFLOXACIN 500 MG PO TABS: 500.0000 mg | ORAL_TABLET | Freq: Every day | ORAL | 0 refills | Status: AC

## 2018-09-16 MED ORDER — MENTHOL 3 MG MT LOZG: 1.0000 | LOZENGE | OROMUCOSAL | 12 refills | Status: DC | PRN

## 2018-09-16 MED ORDER — DM-GUAIFENESIN ER 30-600 MG PO TB12: 1.0000 | ORAL_TABLET | Freq: Two times a day (BID) | ORAL | 0 refills | Status: AC | PRN

## 2018-09-16 NOTE — Discharge Instructions (Signed)
Pt provided with discharge instructions and educated on medications.  Teach-back method used. Pt denies needs and concerns at this time. Denies any further questions. Follow up care discussed. Pt ambulatory at time of discharge with family present

## 2018-09-16 NOTE — Discharge Summary (Signed)
Physician Discharge Summary  [name] ZOX:[id number] DOB: 1986/09/24 DOA: 09/15/2018  PCP: [facility]  Admit date: 09/15/2018  Discharge date: 09/16/2018  Admitted From:Home  Disposition:  Home  Recommendations for Outpatient Follow-up:  1. Follow up with PCP in 1-2 weeks 2. Continue on home insulin regimen as previously prescribed 3. Continue on Levaquin for 6 more days to complete 7-day course of treatment  Home Health: None  Equipment/Devices: None  Discharge Condition: Stable  CODE STATUS: Full  Diet recommendation: Heart Healthy/carb modified  Brief/Interim Summary: Per HPI from Dr. Clyde Lundborg: [name] is a 32 y.o. female with medical history significant of poorly controlled type 1 diabetes, polysubstance abuse, tobacco abuse, DKA, GERD, who presents with generalized weakness, nausea, vomiting, cough, sore throat.  Patient states that she has been feeling bad in the past 3 days.  She reports generalized weakness, nausea, vomiting, cough and sore throat.  She has nonbilious and nonbloody vomitus, more than 15 times today.  She also has mild diffuse abdominal pain.  No diarrhea.  She reports subjective fever and chills, but her temperature is 97.8 in ED.  She ha cough with yellow-colored sputum production.  She has mild shortness of breath, but no chest pain.  She has increased urinary frequency, burning on urination sometimes, but no dysuria or hematuria.  She states that she is taking her insulin consistently, but her blood sugar is elevated>600 at home.  Patient states that she just completed antibiotics for ear infection 3 days ago, still has medication improved to left ear pain.  No active draining.  She was admitted for DKA which was likely triggered by upper respiratory infection along with possible UTI.  She was started on IV Levaquin with improvement noted this morning.  After IV fluid administration and insulin drip she was quickly transitioned to  Lantus as well as SSI with good blood glucose control noted.  She is now tolerating a diet and does not have any further symptoms of nausea or vomiting.  Her acidosis and anion gap levels have resolved.  Influenza swab and strep swab were noted to be within normal limits.  UDS does indicate polysubstance abuse with cocaine and marijuana.  No other acute events noted during the course of this brief admission.  Discharge Diagnoses:  Principal Problem:   DKA Active Problems:   Diabetes mellitus type 1, uncontrolled (HCC)   Acute lower UTI   Nausea & vomiting   Polysubstance abuse (HCC)   Tobacco abuse   Cough   Protein-calorie malnutrition, severe (HCC)   GERD (gastroesophageal reflux disease)  Principal discharge diagnosis: DKA.  Discharge Instructions  Discharge Instructions    Diet - low sodium heart healthy   Complete by:  As directed    Increase activity slowly   Complete by:  As directed      Allergies as of 09/16/2018      Reactions   Codeine Anaphylaxis, Nausea And Vomiting   Penicillins Anaphylaxis, Nausea And Vomiting   Has patient had a PCN reaction causing immediate rash, facial/tongue/throat swelling, SOB or lightheadedness with hypotension: Yes Has patient had a PCN reaction causing severe rash involving mucus membranes or skin necrosis: Yes Has patient had a PCN reaction that required hospitalization Yes Has patient had a PCN reaction occurring within the last 10 years: No If all of the above answers are "NO", then may proceed with Cephalosporin use.   Tramadol Itching      Medication List    TAKE these medications  acetaminophen 500 MG tablet Commonly known as:  TYLENOL Take 1,500 mg by mouth every 6 (six) hours as needed for mild pain, moderate pain or fever.   CLEAR EYES OP Place 1 drop into both eyes daily as needed (dry eyes/ itching/redness).   dextromethorphan-guaiFENesin 30-600 MG 12hr tablet Commonly known as:  MUCINEX DM Take 1 tablet by mouth 2  (two) times daily as needed for up to 10 days for cough.   gabapentin 100 MG capsule Commonly known as:  NEURONTIN Take 200 mg by mouth at bedtime.   insulin glargine 100 UNIT/ML injection Commonly known as:  LANTUS Inject 25 Units into the skin at bedtime.   Insulin Glargine 100 UNIT/ML Solostar Pen Commonly known as:  LANTUS Inject 35 Units into the skin daily at 10 pm.   levofloxacin 500 MG tablet Commonly known as:  LEVAQUIN Take 1 tablet (500 mg total) by mouth at bedtime for 6 days.   LINZESS 290 MCG Caps capsule Generic drug:  linaclotide Take 290 mcg by mouth daily before breakfast.   menthol-cetylpyridinium 3 MG lozenge Commonly known as:  CEPACOL Take 1 lozenge (3 mg total) by mouth as needed for sore throat.   NOVOLOG FLEXPEN 100 UNIT/ML FlexPen Generic drug:  insulin aspart Inject 1-7 Units into the skin 3 (three) times daily with meals. Dose based on CBG and carb count   omeprazole 40 MG capsule Commonly known as:  PRILOSEC Take 40 mg by mouth daily before breakfast.   ondansetron 4 MG tablet Commonly known as:  ZOFRAN Take 1 tablet (4 mg total) by mouth every 6 (six) hours. What changed:    when to take this  reasons to take this      Follow-up Information    [facility] Follow up in 1 week(s).   Contact information: [address] [place] [address] [phone]          Allergies  Allergen Reactions  . Codeine Anaphylaxis and Nausea And Vomiting  . Penicillins Anaphylaxis and Nausea And Vomiting    Has patient had a PCN reaction causing immediate rash, facial/tongue/throat swelling, SOB or lightheadedness with hypotension: Yes Has patient had a PCN reaction causing severe rash involving mucus membranes or skin necrosis: Yes Has patient had a PCN reaction that required hospitalization Yes Has patient had a PCN reaction occurring within the last 10 years: No If all of the above answers are "NO", then may proceed with  Cephalosporin use.   . Tramadol Itching    Consultations:  None   Procedures/Studies: Dg Chest 2 View  Result Date: 09/15/2018 CLINICAL DATA:  Hypoglycemia which shortness-of-breath, weakness, fever and vomiting 3 days. EXAM: CHEST - 2 VIEW COMPARISON:  05/26/2018 FINDINGS: Lungs are adequately inflated without consolidation or effusion. Cardiomediastinal silhouette, bones and soft tissues are normal. IMPRESSION: No active cardiopulmonary disease. Electronically Signed   By: Elberta Fortis M.D.   On: 09/15/2018 17:27     Discharge Exam: Vitals:   09/16/18 0542 09/16/18 0959  BP: 109/68 (!) 101/55  Pulse: 74 77  Resp: 18 16  Temp: 97.9 F (36.6 C) 98 F (36.7 C)  SpO2: 96% 98%   Vitals:   09/16/18 0000 09/16/18 0048 09/16/18 0542 09/16/18 0959  BP: 119/70 100/68 109/68 (!) 101/55  Pulse: 78 89 74 77  Resp: 18 20 18 16   Temp:  97.9 F (36.6 C) 97.9 F (36.6 C) 98 F (36.7 C)  TempSrc:  Oral Oral Oral  SpO2: 98% 98% 96%  98%  Weight:      Height:        General: Pt is alert, awake, not in acute distress Cardiovascular: RRR, S1/S2 +, no rubs, no gallops Respiratory: CTA bilaterally, no wheezing, no rhonchi Abdominal: Soft, NT, ND, bowel sounds + Extremities: no edema, no cyanosis    The results of significant diagnostics from this hospitalization (including imaging, microbiology, ancillary and laboratory) are listed below for reference.     Microbiology: Recent Results (from the past 240 hour(s))  Culture, blood (Routine X 2) w Reflex to ID Panel     Status: None (Preliminary result)   Collection Time: 09/15/18  8:42 PM  Result Value Ref Range Status   Specimen Description BLOOD LEFT HAND  Final   Special Requests   Final    BOTTLES DRAWN AEROBIC AND ANAEROBIC Blood Culture adequate volume   Culture   Final    NO GROWTH < 12 HOURS Performed at [facility], [address], [place], [place] [address]    Report Status PENDING  Incomplete  Culture,  blood (Routine X 2) w Reflex to ID Panel     Status: None (Preliminary result)   Collection Time: 09/15/18  8:49 PM  Result Value Ref Range Status   Specimen Description BLOOD LEFT HAND  Final   Special Requests   Final    BOTTLES DRAWN AEROBIC AND ANAEROBIC Blood Culture results may not be optimal due to an inadequate volume of blood received in culture bottles   Culture   Final    NO GROWTH < 12 HOURS Performed at [facility], [address] [place], [place] [address]    Report Status PENDING  Incomplete     Labs: BNP (last 3 results) No results for input(s): BNP in the last 8760 hours. Basic Metabolic Panel: Recent Labs  Lab 09/15/18 1649 09/15/18 1700 09/15/18 2042 09/15/18 2147  NA 134* 135 132* 130*  K 4.4 4.1 4.3 4.5  CL 95* 100 98 97*  CO2 21*  --  20* 21*  GLUCOSE 288* 296* 324* 383*  BUN 19 24* 20 19  CREATININE 0.87 0.50 0.83 0.79  CALCIUM 9.7  --  8.8* 8.7*   Liver Function Tests: Recent Labs  Lab 09/15/18 1649  AST 18  ALT 18  ALKPHOS 75  BILITOT 1.3*  PROT 8.6*  ALBUMIN 4.4   Recent Labs  Lab 09/15/18 1649  LIPASE 21   No results for input(s): AMMONIA in the last 168 hours. CBC: Recent Labs  Lab 09/15/18 1649 09/15/18 1700  WBC 9.8  --   NEUTROABS 6.8  --   HGB 16.5* 18.0*  HCT 51.5* 53.0*  MCV 88.9  --   PLT 320  --    Cardiac Enzymes: No results for input(s): CKTOTAL, CKMB, CKMBINDEX, TROPONINI in the last 168 hours. BNP: Invalid input(s): POCBNP CBG: Recent Labs  Lab 09/15/18 1639 09/15/18 2101 09/15/18 2257 09/16/18 0742  GLUCAP 290* 325* 236* 263*   D-Dimer No results for input(s): DDIMER in the last 72 hours. Hgb A1c No results for input(s): HGBA1C in the last 72 hours. Lipid Profile No results for input(s): CHOL, HDL, LDLCALC, TRIG, CHOLHDL, LDLDIRECT in the last 72 hours. Thyroid function studies No results for input(s): TSH, T4TOTAL, T3FREE, THYROIDAB in the last 72 hours.  Invalid input(s):  FREET3 Anemia work up No results for input(s): VITAMINB12, FOLATE, FERRITIN, TIBC, IRON, RETICCTPCT in the last 72 hours. Urinalysis    Component Value Date/Time  COLORURINE YELLOW 09/15/2018 1843   APPEARANCEUR HAZY (A) 09/15/2018 1843   LABSPEC 1.033 (H) 09/15/2018 1843   PHURINE 5.0 09/15/2018 1843   GLUCOSEU >=500 (A) 09/15/2018 1843   HGBUR SMALL (A) 09/15/2018 1843   BILIRUBINUR NEGATIVE 09/15/2018 1843   KETONESUR 80 (A) 09/15/2018 1843   PROTEINUR 100 (A) 09/15/2018 1843   UROBILINOGEN 1.0 07/28/2015 1402   NITRITE NEGATIVE 09/15/2018 1843   LEUKOCYTESUR SMALL (A) 09/15/2018 1843   Sepsis Labs Invalid input(s): PROCALCITONIN,  WBC,  LACTICIDVEN Microbiology Recent Results (from the past 240 hour(s))  Culture, blood (Routine X 2) w Reflex to ID Panel     Status: None (Preliminary result)   Collection Time: 09/15/18  8:42 PM  Result Value Ref Range Status   Specimen Description BLOOD LEFT HAND  Final   Special Requests   Final    BOTTLES DRAWN AEROBIC AND ANAEROBIC Blood Culture adequate volume   Culture   Final    NO GROWTH < 12 HOURS Performed at [facility], [address] [place], [place] [address]    Report Status PENDING  Incomplete  Culture, blood (Routine X 2) w Reflex to ID Panel     Status: None (Preliminary result)   Collection Time: 09/15/18  8:49 PM  Result Value Ref Range Status   Specimen Description BLOOD LEFT HAND  Final   Special Requests   Final    BOTTLES DRAWN AEROBIC AND ANAEROBIC Blood Culture results may not be optimal due to an inadequate volume of blood received in culture bottles   Culture   Final    NO GROWTH < 12 HOURS Performed at [facility], [address], [place], [place] [address]    Report Status PENDING  Incomplete     Time coordinating discharge: 35 minutes  SIGNED:   Erick Blinks, DO [facility] 09/16/2018, 10:07 AM Pager [phone]  If 7PM-7AM, please contact  night-coverage [email or web address] Password [email or web address]

## 2018-09-17 LAB — URINE CULTURE: Culture: <10000 — AB

## 2018-09-20 LAB — CULTURE, BLOOD (ROUTINE X 2)
CULTURE: NO GROWTH
CULTURE: NO GROWTH
Special Requests: ADEQUATE

## 2018-10-22 ENCOUNTER — Encounter (HOSPITAL_COMMUNITY): Payer: Self-pay | Admitting: Emergency Medicine

## 2018-10-22 ENCOUNTER — Emergency Department (HOSPITAL_COMMUNITY)
Admission: EM | Admit: 2018-10-22 | Discharge: 2018-10-22 | Disposition: A | Discharge status: Home / Self Care | Payer: Medicaid Other | Attending: Emergency Medicine | Admitting: Emergency Medicine

## 2018-10-22 ENCOUNTER — Other Ambulatory Visit: Payer: Self-pay

## 2018-10-22 DIAGNOSIS — N898 Other specified noninflammatory disorders of vagina: Secondary | ICD-10-CM | POA: Insufficient documentation

## 2018-10-22 DIAGNOSIS — Z5321 Procedure and treatment not carried out due to patient leaving prior to being seen by health care provider: Secondary | ICD-10-CM | POA: Insufficient documentation

## 2018-10-22 NOTE — ED Notes (Signed)
Called x 3 no answer 

## 2018-10-22 NOTE — ED Triage Notes (Signed)
Pt reports she feels a protrusion into her vagina 3 days ago, states it feels like a prolapsed bladder or cervix. Last pap was a month ago and states it was normal. Denies hx of prolapse. Also has been having increased blood sugars despite insulin usage.

## 2018-10-28 ENCOUNTER — Emergency Department (HOSPITAL_COMMUNITY): Payer: Medicaid Other

## 2018-10-28 ENCOUNTER — Other Ambulatory Visit: Payer: Self-pay

## 2018-10-28 ENCOUNTER — Inpatient Hospital Stay (HOSPITAL_COMMUNITY)
Admission: EM | Admit: 2018-10-28 | Discharge: 2018-10-29 | DRG: 639 | Discharge status: Against Medical Advice | Payer: Medicaid Other | Attending: Internal Medicine | Admitting: Internal Medicine

## 2018-10-28 ENCOUNTER — Encounter (HOSPITAL_COMMUNITY): Payer: Self-pay | Admitting: Pharmacy Technician

## 2018-10-28 DIAGNOSIS — Z8249 Family history of ischemic heart disease and other diseases of the circulatory system: Secondary | ICD-10-CM | POA: Diagnosis not present

## 2018-10-28 DIAGNOSIS — Z885 Allergy status to narcotic agent status: Secondary | ICD-10-CM

## 2018-10-28 DIAGNOSIS — T383X6A Underdosing of insulin and oral hypoglycemic [antidiabetic] drugs, initial encounter: Secondary | ICD-10-CM | POA: Diagnosis present

## 2018-10-28 DIAGNOSIS — F1410 Cocaine abuse, uncomplicated: Secondary | ICD-10-CM | POA: Diagnosis present

## 2018-10-28 DIAGNOSIS — Z880 Allergy status to penicillin: Secondary | ICD-10-CM

## 2018-10-28 DIAGNOSIS — Z720 Tobacco use: Secondary | ICD-10-CM | POA: Diagnosis present

## 2018-10-28 DIAGNOSIS — K219 Gastro-esophageal reflux disease without esophagitis: Secondary | ICD-10-CM | POA: Diagnosis present

## 2018-10-28 DIAGNOSIS — Z888 Allergy status to other drugs, medicaments and biological substances status: Secondary | ICD-10-CM

## 2018-10-28 DIAGNOSIS — F17210 Nicotine dependence, cigarettes, uncomplicated: Secondary | ICD-10-CM | POA: Diagnosis present

## 2018-10-28 DIAGNOSIS — E1010 Type 1 diabetes mellitus with ketoacidosis without coma: Principal | ICD-10-CM | POA: Diagnosis present

## 2018-10-28 DIAGNOSIS — Z833 Family history of diabetes mellitus: Secondary | ICD-10-CM | POA: Diagnosis not present

## 2018-10-28 DIAGNOSIS — E1069 Type 1 diabetes mellitus with other specified complication: Secondary | ICD-10-CM | POA: Diagnosis present

## 2018-10-28 DIAGNOSIS — E1065 Type 1 diabetes mellitus with hyperglycemia: Secondary | ICD-10-CM | POA: Diagnosis present

## 2018-10-28 DIAGNOSIS — R112 Nausea with vomiting, unspecified: Secondary | ICD-10-CM | POA: Diagnosis not present

## 2018-10-28 DIAGNOSIS — Z5329 Procedure and treatment not carried out because of patient's decision for other reasons: Secondary | ICD-10-CM | POA: Diagnosis present

## 2018-10-28 DIAGNOSIS — Z794 Long term (current) use of insulin: Secondary | ICD-10-CM | POA: Diagnosis not present

## 2018-10-28 DIAGNOSIS — Z801 Family history of malignant neoplasm of trachea, bronchus and lung: Secondary | ICD-10-CM | POA: Diagnosis not present

## 2018-10-28 DIAGNOSIS — Z9119 Patient's noncompliance with other medical treatment and regimen: Secondary | ICD-10-CM

## 2018-10-28 DIAGNOSIS — Z79899 Other long term (current) drug therapy: Secondary | ICD-10-CM | POA: Diagnosis not present

## 2018-10-28 DIAGNOSIS — R739 Hyperglycemia, unspecified: Secondary | ICD-10-CM

## 2018-10-28 DIAGNOSIS — F1910 Other psychoactive substance abuse, uncomplicated: Secondary | ICD-10-CM | POA: Diagnosis present

## 2018-10-28 DIAGNOSIS — R0602 Shortness of breath: Secondary | ICD-10-CM | POA: Diagnosis not present

## 2018-10-28 LAB — CBG MONITORING, ED
Glucose-Capillary: >600 mg/dL (ref 70–99)
Glucose-Capillary: 505 mg/dL (ref 70–99)
Glucose-Capillary: 358 mg/dL — ABNORMAL HIGH (ref 70–99)
Glucose-Capillary: 223 mg/dL — ABNORMAL HIGH (ref 70–99)

## 2018-10-28 LAB — CBC
HCT: 45.8 % (ref 36.0–46.0)
Hemoglobin: 14.2 g/dL (ref 12.0–15.0)
MCH: 28.7 pg (ref 26.0–34.0)
MCHC: 31 g/dL (ref 30.0–36.0)
MCV: 92.5 fL (ref 80.0–100.0)
Platelets: 269 10*3/uL (ref 150–400)
RBC: 4.95 MIL/uL (ref 3.87–5.11)
RDW: 14.8 % (ref 11.5–15.5)
WBC: 6.1 10*3/uL (ref 4.0–10.5)
nRBC: 0 % (ref 0.0–0.2)

## 2018-10-28 LAB — BASIC METABOLIC PANEL
Anion gap: 6 (ref 5–15)
BUN: 16 mg/dL (ref 6–20)
CO2: 26 mmol/L (ref 22–32)
Calcium: 8.5 mg/dL — ABNORMAL LOW (ref 8.9–10.3)
Chloride: 106 mmol/L (ref 98–111)
Creatinine, Ser: 0.64 mg/dL (ref 0.44–1.00)
GFR calc Af Amer: >60 mL/min (ref >60)
GFR calc non Af Amer: >60 mL/min (ref >60)
Glucose, Bld: 150 mg/dL — ABNORMAL HIGH (ref 70–99)
POTASSIUM: 4 mmol/L (ref 3.5–5.1)
Sodium: 138 mmol/L (ref 135–145)

## 2018-10-28 LAB — I-STAT VENOUS BLOOD GAS, ED
Acid-base deficit: 2 mmol/L (ref 0.0–2.0)
BICARBONATE: 24.6 mmol/L (ref 20.0–28.0)
O2 Saturation: 99 %
PCO2 VEN: 46.9 mmHg (ref 44.0–60.0)
TCO2: 26 mmol/L (ref 22–32)
pH, Ven: 7.327 (ref 7.250–7.430)
pO2, Ven: 149 mmHg — ABNORMAL HIGH (ref 32.0–45.0)

## 2018-10-28 LAB — COMPREHENSIVE METABOLIC PANEL
ALT: 27 U/L (ref 0–44)
AST: 18 U/L (ref 15–41)
Albumin: 3.9 g/dL (ref 3.5–5.0)
Alkaline Phosphatase: 67 U/L (ref 38–126)
Anion gap: 19 — ABNORMAL HIGH (ref 5–15)
BILIRUBIN TOTAL: 0.6 mg/dL (ref 0.3–1.2)
BUN: 19 mg/dL (ref 6–20)
CO2: 19 mmol/L — ABNORMAL LOW (ref 22–32)
Calcium: 9.3 mg/dL (ref 8.9–10.3)
Chloride: 90 mmol/L — ABNORMAL LOW (ref 98–111)
Creatinine, Ser: 1.07 mg/dL — ABNORMAL HIGH (ref 0.44–1.00)
GFR calc Af Amer: >60 mL/min (ref >60)
GFR calc non Af Amer: >60 mL/min (ref >60)
Glucose, Bld: 778 mg/dL (ref 70–99)
POTASSIUM: 4.9 mmol/L (ref 3.5–5.1)
Sodium: 128 mmol/L — ABNORMAL LOW (ref 135–145)
TOTAL PROTEIN: 7.6 g/dL (ref 6.5–8.1)

## 2018-10-28 LAB — GLUCOSE, CAPILLARY
Glucose-Capillary: 143 mg/dL — ABNORMAL HIGH (ref 70–99)
Glucose-Capillary: 120 mg/dL — ABNORMAL HIGH (ref 70–99)
Glucose-Capillary: 125 mg/dL — ABNORMAL HIGH (ref 70–99)
Glucose-Capillary: 153 mg/dL — ABNORMAL HIGH (ref 70–99)

## 2018-10-28 LAB — I-STAT CHEM 8, ED
BUN: 23 mg/dL — ABNORMAL HIGH (ref 6–20)
Calcium, Ion: 1.12 mmol/L — ABNORMAL LOW (ref 1.15–1.40)
Chloride: 93 mmol/L — ABNORMAL LOW (ref 98–111)
Creatinine, Ser: 0.6 mg/dL (ref 0.44–1.00)
Glucose, Bld: >700 mg/dL (ref 70–99)
HCT: 48 % — ABNORMAL HIGH (ref 36.0–46.0)
Hemoglobin: 16.3 g/dL — ABNORMAL HIGH (ref 12.0–15.0)
POTASSIUM: 4.9 mmol/L (ref 3.5–5.1)
Sodium: 127 mmol/L — ABNORMAL LOW (ref 135–145)
TCO2: 24 mmol/L (ref 22–32)

## 2018-10-28 LAB — URINALYSIS, ROUTINE W REFLEX MICROSCOPIC
Bilirubin Urine: NEGATIVE
Ketones, ur: 20 mg/dL — AB
Nitrite: NEGATIVE
Protein, ur: NEGATIVE mg/dL
Specific Gravity, Urine: 1.028 (ref 1.005–1.030)
pH: 5 (ref 5.0–8.0)

## 2018-10-28 LAB — RAPID URINE DRUG SCREEN, HOSP PERFORMED
Amphetamines: NOT DETECTED
Barbiturates: NOT DETECTED
Benzodiazepines: NOT DETECTED
Cocaine: POSITIVE — AB
Opiates: NOT DETECTED
Tetrahydrocannabinol: NOT DETECTED

## 2018-10-28 LAB — I-STAT BETA HCG BLOOD, ED (MC, WL, AP ONLY): I-stat hCG, quantitative: <5 m[IU]/mL (ref <5)

## 2018-10-28 LAB — I-STAT TROPONIN, ED: Troponin i, poc: 0 ng/mL (ref 0.00–0.08)

## 2018-10-28 LAB — HCG, SERUM, QUALITATIVE: Preg, Serum: NEGATIVE

## 2018-10-28 LAB — HEMOGLOBIN A1C
Hgb A1c MFr Bld: 11.5 % — ABNORMAL HIGH (ref 4.8–5.6)
Mean Plasma Glucose: 283.35 mg/dL

## 2018-10-28 LAB — MRSA PCR SCREENING: MRSA by PCR: NEGATIVE

## 2018-10-28 MED ORDER — SODIUM CHLORIDE 0.9 % IV SOLN
INTRAVENOUS | Status: DC
  Administered 2018-10-29: 10:00:00 via INTRAVENOUS

## 2018-10-28 MED ORDER — ONDANSETRON HCL 4 MG PO TABS: 4.0000 mg | ORAL_TABLET | Freq: Four times a day (QID) | ORAL | Status: DC | PRN

## 2018-10-28 MED ORDER — INSULIN REGULAR(HUMAN) IN NACL 100-0.9 UT/100ML-% IV SOLN
INTRAVENOUS | Status: DC
  Administered 2018-10-28: 4.5 [IU]/h via INTRAVENOUS
  Filled 2018-10-28: qty 100

## 2018-10-28 MED ORDER — ENOXAPARIN SODIUM 40 MG/0.4ML ~~LOC~~ SOLN
40.0000 mg | SUBCUTANEOUS | Status: DC
  Administered 2018-10-29: 40 mg via SUBCUTANEOUS
  Filled 2018-10-28: qty 0.4

## 2018-10-28 MED ORDER — ACETAMINOPHEN 500 MG PO TABS: 500.0000 mg | ORAL_TABLET | Freq: Four times a day (QID) | ORAL | Status: DC | PRN

## 2018-10-28 MED ORDER — ALBUTEROL SULFATE (2.5 MG/3ML) 0.083% IN NEBU: 2.5000 mg | INHALATION_SOLUTION | RESPIRATORY_TRACT | Status: DC | PRN

## 2018-10-28 MED ORDER — SERTRALINE HCL 25 MG PO TABS
25.0000 mg | ORAL_TABLET | Freq: Every day | ORAL | Status: DC
  Administered 2018-10-29: 25 mg via ORAL
  Filled 2018-10-28: qty 1

## 2018-10-28 MED ORDER — ONDANSETRON HCL 4 MG/2ML IJ SOLN: 4.0000 mg | Freq: Four times a day (QID) | INTRAMUSCULAR | Status: DC | PRN

## 2018-10-28 MED ORDER — SODIUM CHLORIDE 0.9 % IV SOLN: INTRAVENOUS | Status: AC

## 2018-10-28 MED ORDER — NICOTINE 7 MG/24HR TD PT24
7.0000 mg | MEDICATED_PATCH | Freq: Every day | TRANSDERMAL | Status: DC
  Administered 2018-10-29: 7 mg via TRANSDERMAL
  Filled 2018-10-28: qty 1

## 2018-10-28 MED ORDER — METOCLOPRAMIDE HCL 5 MG/ML IJ SOLN
10.0000 mg | Freq: Once | INTRAMUSCULAR | Status: AC
  Administered 2018-10-28: 10 mg via INTRAVENOUS
  Filled 2018-10-28: qty 2

## 2018-10-28 MED ORDER — DEXTROSE-NACL 5-0.45 % IV SOLN
INTRAVENOUS | Status: DC
  Administered 2018-10-28: 19:00:00 via INTRAVENOUS

## 2018-10-28 MED ORDER — PANTOPRAZOLE SODIUM 40 MG PO TBEC
40.0000 mg | DELAYED_RELEASE_TABLET | Freq: Every day | ORAL | Status: DC
  Administered 2018-10-29: 40 mg via ORAL
  Filled 2018-10-28: qty 1

## 2018-10-28 MED ORDER — DEXTROSE-NACL 5-0.45 % IV SOLN
INTRAVENOUS | Status: DC
  Administered 2018-10-28: 23:00:00 via INTRAVENOUS

## 2018-10-28 MED ORDER — INSULIN REGULAR(HUMAN) IN NACL 100-0.9 UT/100ML-% IV SOLN
INTRAVENOUS | Status: DC
  Filled 2018-10-28 (×2): qty 100

## 2018-10-28 MED ORDER — GABAPENTIN 100 MG PO CAPS
200.0000 mg | ORAL_CAPSULE | Freq: Every day | ORAL | Status: DC
  Administered 2018-10-28: 200 mg via ORAL
  Filled 2018-10-28: qty 2

## 2018-10-28 NOTE — ED Triage Notes (Signed)
Pt arrives POV with reports of glucose being high and feeling sob since yesterday. Pt in NAD.

## 2018-10-28 NOTE — ED Notes (Signed)
[name] made awre of Chem8 lab results. ED-Lab

## 2018-10-28 NOTE — ED Provider Notes (Addendum)
[facility] EMERGENCY DEPARTMENT Provider Note   CSN: [id number] Arrival date & time: 10/28/18  1213     History   Chief Complaint Chief Complaint  Patient presents with  . Shortness of Breath  . Hyperglycemia    HPI [name] is a 33 y.o. female.  33 y.o female with a PMH of DKA, Polysubstance abuse presents to the ED with a chief complaint of elevated blood sugar and abdominal pain x 1 day. Patient reports she has not been taking her insulin because she has been feeling nauseated in the past day. She reports taking her insulin this morning but already having an elevated sugar. She endorses abdominal pain dull ache in the generalized abdominal region. Patient also reports some nausea and 3 episodes of vomiting this morning.She denies any drug use, chest pain but does endorse some shortness of breath.      Past Medical History:  Diagnosis Date  . Diabetes mellitus   . DKA 05/01/2011  . Polysubstance abuse [facility])     Patient Active Problem List   Diagnosis Date Noted  . Polysubstance abuse (HCC) 09/15/2018  . Tobacco abuse 09/15/2018  . Cough 09/15/2018  . Protein-calorie malnutrition, severe (HCC) 09/15/2018  . GERD (gastroesophageal reflux disease) 09/15/2018  . Nausea & vomiting   . Acute lower UTI 07/14/2016  . Panic disorder without agoraphobia with moderate panic attacks 01/20/2016  . Benzodiazepine abuse (HCC) 01/20/2016  . Substance induced mood disorder (HCC) 01/19/2016  . DKA 05/01/2011  . Diabetes mellitus type 1, uncontrolled (HCC) 05/01/2011  . Leukocytosis 05/01/2011    Past Surgical History:  Procedure Laterality Date  . TUBAL LIGATION    . WOUND EXPLORATION Left 07/26/2014   Procedure: Irrigation, debridement and exploration of elbow wounds;  Surgeon: Betha Loa, MD;  Location: [facility];  Service: Orthopedics;  Laterality: Left;     OB History    Gravida  2   Para  2   Term      Preterm      AB      Living        SAB      TAB      Ectopic      Multiple      Live Births               Home Medications    Prior to Admission medications   Medication Sig Start Date End Date Taking? Authorizing Provider  acetaminophen (TYLENOL) 500 MG tablet Take 1,500 mg by mouth every 6 (six) hours as needed for mild pain, moderate pain or fever.    Yes [provider]  gabapentin (NEURONTIN) 100 MG capsule Take 200 mg by mouth at bedtime. 08/24/18  Yes [provider]  insulin aspart (NOVOLOG FLEXPEN) 100 UNIT/ML FlexPen Inject 1-7 Units into the skin 3 (three) times daily with meals. Dose based on CBG and carb count   Yes [provider]  Insulin Glargine (LANTUS SOLOSTAR) 100 UNIT/ML Solostar Pen Inject 35 Units into the skin daily at 10 pm. 07/15/16  Yes Lonia Blood, MD  insulin glargine (LANTUS) 100 UNIT/ML injection Inject 35 Units into the skin at bedtime.    Yes [provider]  linaclotide (LINZESS) 290 MCG CAPS capsule Take 290 mcg by mouth daily before breakfast.   Yes [provider]  menthol-cetylpyridinium (CEPACOL) 3 MG lozenge Take 1 lozenge (3 mg total) by mouth as needed for sore throat. 09/16/18  Yes Sherryll Burger, Pratik D,  DO  Naphazoline HCl (CLEAR EYES OP) Place 1 drop into both eyes daily as needed (dry eyes/ itching/redness).   Yes [provider]  omeprazole (PRILOSEC) 40 MG capsule Take 40 mg by mouth daily before breakfast.    Yes [provider]  ondansetron (ZOFRAN) 4 MG tablet Take 1 tablet (4 mg total) by mouth every 6 (six) hours. Patient taking differently: Take 4 mg by mouth every 6 (six) hours as needed for nausea or vomiting.  03/20/18  Yes Hedges, Tinnie Gens, PA-C  sertraline (ZOLOFT) 25 MG tablet Take 25 mg by mouth daily.   Yes [provider]    Family History Family History  Problem Relation Age of Onset  . Coronary artery disease Mother   . Lung cancer Mother   . Heart attack Father 36  . Thyroid  disease Maternal Grandmother   . Diabetes type I Daughter     Social History Social History   Tobacco Use  . Smoking status: Current Every Day Smoker    Packs/day: 1.00    Years: 15.00    Pack years: 15.00    Types: Cigarettes  . Smokeless tobacco: Never Used  . Tobacco comment: She is currently trying to quit.  Substance Use Topics  . Alcohol use: No  . Drug use: No    Comment: HX of cocaine and marijuana use     Allergies   Codeine; Penicillins; and Tramadol   Review of Systems Review of Systems  Constitutional: Negative for fever.  HENT: Negative for sore throat.   Respiratory: Positive for shortness of breath.   Cardiovascular: Negative for chest pain.  Gastrointestinal: Positive for abdominal pain, nausea and vomiting.  Genitourinary: Negative for dysuria and flank pain.  Musculoskeletal: Negative for back pain.  Skin: Negative for pallor and wound.  Neurological: Negative for dizziness and light-headedness.  All other systems reviewed and are negative.    Physical Exam Updated Vital Signs BP 103/75   Pulse (!) 109   Resp 20   Ht 5' (1.524 m)   Wt 45 kg   LMP 10/08/2018   SpO2 99%   BMI 19.38 kg/m   Physical Exam Vitals signs and nursing note reviewed.  Constitutional:      General: She is not in acute distress.    Appearance: She is well-developed. She is ill-appearing.     Comments: cachectic   HENT:     Head: Normocephalic and atraumatic.     Mouth/Throat:     Pharynx: No oropharyngeal exudate.  Eyes:     Pupils: Pupils are equal, round, and reactive to light.  Neck:     Musculoskeletal: Normal range of motion.  Cardiovascular:     Rate and Rhythm: Regular rhythm.     Heart sounds: Normal heart sounds.  Pulmonary:     Effort: Pulmonary effort is normal. No respiratory distress.     Breath sounds: Examination of the right-middle field reveals decreased breath sounds. Examination of the left-middle field reveals decreased breath sounds.  Examination of the right-lower field reveals decreased breath sounds. Examination of the left-lower field reveals decreased breath sounds. Decreased breath sounds present. No wheezing.  Abdominal:     General: Bowel sounds are normal. There is no distension.     Palpations: Abdomen is soft.     Tenderness: There is no abdominal tenderness.  Musculoskeletal:        General: No tenderness or deformity.     Right lower leg: No edema.  Left lower leg: No edema.  Skin:    General: Skin is warm and dry.  Neurological:     Mental Status: She is alert and oriented to person, place, and time.      ED Treatments / Results  Labs (all labs ordered are listed, but only abnormal results are displayed) Labs Reviewed  COMPREHENSIVE METABOLIC PANEL - Abnormal; Notable for the following components:      Result Value   Sodium 128 (*)    Chloride 90 (*)    CO2 19 (*)    Glucose, Bld 778 (*)    Creatinine, Ser 1.07 (*)    Anion gap 19 (*)    All other components within normal limits  URINALYSIS, ROUTINE W REFLEX MICROSCOPIC - Abnormal; Notable for the following components:   Color, Urine COLORLESS (*)    Glucose, UA >=500 (*)    Hgb urine dipstick SMALL (*)    Ketones, ur 20 (*)    Leukocytes, UA SMALL (*)    Bacteria, UA RARE (*)    All other components within normal limits  RAPID URINE DRUG SCREEN, HOSP PERFORMED - Abnormal; Notable for the following components:   Cocaine POSITIVE (*)    All other components within normal limits  CBG MONITORING, ED - Abnormal; Notable for the following components:   Glucose-Capillary >600 (*)    All other components within normal limits  I-STAT CHEM 8, ED - Abnormal; Notable for the following components:   Sodium 127 (*)    Chloride 93 (*)    BUN 23 (*)    Glucose, Bld >700 (*)    Calcium, Ion 1.12 (*)    Hemoglobin 16.3 (*)    HCT 48.0 (*)    All other components within normal limits  I-STAT VENOUS BLOOD GAS, ED - Abnormal; Notable for the  following components:   pO2, Ven 149.0 (*)    All other components within normal limits  CBG MONITORING, ED - Abnormal; Notable for the following components:   Glucose-Capillary 505 (*)    All other components within normal limits  CBG MONITORING, ED - Abnormal; Notable for the following components:   Glucose-Capillary 358 (*)    All other components within normal limits  CBC  HCG, SERUM, QUALITATIVE  I-STAT TROPONIN, ED  I-STAT BETA HCG BLOOD, ED ([facility], [facility], [facility] ONLY)  I-STAT BETA HCG BLOOD, ED ([facility], [facility], [facility] ONLY)    EKG EKG Interpretation  Date/Time:  Sunday October 28 2018 12:29:11 EST Ventricular Rate:  85 PR Interval:    QRS Duration: 93 QT Interval:  393 QTC Calculation: 468 R Axis:   83 Text Interpretation:  Sinus rhythm Anterior infarct, possibly acute Baseline wander in lead(s) II III aVF Since last tracing Non-specific ST-t changes are present Confirmed by Mancel BaleWentz, Elliott [phone]([id number]) on 10/28/2018 12:37:49 PM   Radiology Dg Chest 2 View  Result Date: 10/28/2018 CLINICAL DATA:  Shortness of breath. EXAM: CHEST - 2 VIEW COMPARISON:  Chest x-ray dated September 15, 2018. FINDINGS: The heart size and mediastinal contours are within normal limits. Both lungs are clear. The visualized skeletal structures are unremarkable. IMPRESSION: No active cardiopulmonary disease. Electronically Signed   By: Obie DredgeWilliam T Derry M.D.   On: 10/28/2018 14:33    Procedures .Critical Care Performed by: Claude MangesSoto, Keslyn Teater, PA-C Authorized by: Claude MangesSoto, Londin Antone, PA-C   Critical care provider statement:    Critical care time (minutes):  45   Critical care start time:  10/28/2018 12:00 PM   Critical care  end time:  10/28/2018 12:45 PM   Critical care time was exclusive of:  Separately billable procedures and treating other patients   Critical care was necessary to treat or prevent imminent or life-threatening deterioration of the following conditions:  Metabolic crisis   Critical care was time spent personally by me  on the following activities:  Blood draw for specimens, development of treatment plan with patient or surrogate, discussions with consultants, evaluation of patient's response to treatment, examination of patient, obtaining history from patient or surrogate, ordering and performing treatments and interventions, ordering and review of laboratory studies, ordering and review of radiographic studies, pulse oximetry, re-evaluation of patient's condition and review of old charts   I assumed direction of critical care for this patient from another provider in my specialty: no     (including critical care time)  Medications Ordered in ED Medications  insulin regular, human (MYXREDLIN) 100 units/ 100 mL infusion (4.5 Units/hr Intravenous New Bag/Given 10/28/18 1419)  dextrose 5 %-0.45 % sodium chloride infusion ( Intravenous Hold 10/28/18 1424)  metoCLOPramide (REGLAN) injection 10 mg (10 mg Intravenous Given 10/28/18 1418)     Initial Impression / Assessment and Plan / ED Course  I have reviewed the triage vital signs and the nursing notes.  Pertinent labs & imaging results that were available during my care of the patient were reviewed by me and considered in my medical decision making (see chart for details).     Has been complaints of shortness of breath, elevated glucose, she reports noncompliance with her medication, glucose on arrival was greater than 600. CMP showed slight decrease in sodium, CO2 is 19, creatinine is 1.07 from previous visit. Anion gap is 19 at this time, also reports a generalized abdominal pain, along with nausea.  Will provide her with fluids along with glucose and Reglan for her nausea.  Leota JacobsenFountain was negative, chest x-ray is pending.  Blood glucose is now 500 after 1 L bolus, will now begin insulin. UDS returned positive for cocaine, she denies any illicit drug use. UA shows no nitrite, small leukocytes, small hgb. At this time will place call for hospitalist admission for  hyperglycemia.   3:38 PM Spoke to hospitalist, who advised patient be kept on insulin and he will evaluate and admit patient for hyperglycemia.   Final Clinical Impressions(s) / ED Diagnoses   Final diagnoses:  Hyperglycemia  Intractable vomiting with nausea, unspecified vomiting type    ED Discharge Orders    None       Claude MangesSoto, Genavie Boettger, Cordelia Poche-C 10/28/18 1539    Mancel BaleWentz, Elliott, MD 10/29/18 [address]55    Cabe Lashley, Midway[place], PA-C 12/21/18 [id number]    Mancel BaleWentz, Elliott, MD 12/22/18 [phone]

## 2018-10-28 NOTE — H&P (Signed)
History and Physical    [name] EHO:[id number] DOB: 1986-03-13 DOA: 10/28/2018  [facility]: [facility]  Patient coming from: Home.  I have personally briefly reviewed patient's old medical records in Epic and Care everywhere.  She had been to the hospital multiple times.  Chief Complaint: Nausea vomiting and high blood sugars.  Shortness of breath.  HPI: [name] is a 33 y.o. female with medical history significant of poorly controlled type 1 diabetes, polysubstance abuse, smoker, recurrent diabetic ketoacidosis, GERD who presents to the hospital with persistent nausea, multiple episodes of vomiting, not feeling well and high blood sugars at home.  Patient is type 1 diabetes.  She uses cocaine.  Has been to the hospital many times with similar problems.  She herself stated that she is not compliant to insulin all the time.  According to the patient, she has been missing her doses of insulin last week.  Patient is started having some abdominal pain per last 2 days, worse since yesterday.  Complains of diffuse abdominal discomfort, multiple episodes of emesis and not able to eat anything.  She also complains of shortness of breath mostly along with nausea and vomiting.  Denies any fever chills.  Denies any flulike symptoms.  Patient denies any headache, dizziness, lightheadedness, syncopal episodes.  Denies any chest pain or shortness of breath or wheezing.  Her last bowel movement was 3 days ago and it was normal.  She is urinating normally. ED Course: Patient is hemodynamically stable.  She is with extreme nausea.  Her blood sugars are more than 700 on arrival.  Slightly acidotic.  Renal function is normal.  Anion gap is 19.  Chest x-ray was essentially normal.  No evidence of infection.  UDS positive for cocaine.  Because of persistent nausea, she will need IV insulin until symptomatically better to transition to subcu insulin.  Review of Systems: As per HPI otherwise 10 point  review of systems negative.    Past Medical History:  Diagnosis Date  . Diabetes mellitus   . DKA 05/01/2011  . Polysubstance abuse [facility])     Past Surgical History:  Procedure Laterality Date  . TUBAL LIGATION    . WOUND EXPLORATION Left 07/26/2014   Procedure: Irrigation, debridement and exploration of elbow wounds;  Surgeon: Betha Loa, MD;  Location: [facility] OR;  Service: Orthopedics;  Laterality: Left;     reports that she has been smoking cigarettes. She has a 15.00 pack-year smoking history. She has never used smokeless tobacco. She reports that she does not drink alcohol or use drugs.  Allergies  Allergen Reactions  . Codeine Anaphylaxis and Nausea And Vomiting  . Penicillins Anaphylaxis and Nausea And Vomiting    Has patient had a PCN reaction causing immediate rash, facial/tongue/throat swelling, SOB or lightheadedness with hypotension: Yes Has patient had a PCN reaction causing severe rash involving mucus membranes or skin necrosis: Yes Has patient had a PCN reaction that required hospitalization Yes Has patient had a PCN reaction occurring within the last 10 years: No If all of the above answers are "NO", then may proceed with Cephalosporin use.   . Tramadol Itching    Family History  Problem Relation Age of Onset  . Coronary artery disease Mother   . Lung cancer Mother   . Heart attack Father 61  . Thyroid disease Maternal Grandmother   . Diabetes type I Daughter      Prior to Admission medications   Medication Sig Start Date End Date Taking?  Authorizing Provider  acetaminophen (TYLENOL) 500 MG tablet Take 1,500 mg by mouth every 6 (six) hours as needed for mild pain, moderate pain or fever.    Yes [provider]  gabapentin (NEURONTIN) 100 MG capsule Take 200 mg by mouth at bedtime. 08/24/18  Yes [provider]  insulin aspart (NOVOLOG FLEXPEN) 100 UNIT/ML FlexPen Inject 1-7 Units into the skin 3 (three) times daily with meals. Dose based on  CBG and carb count   Yes [provider]  Insulin Glargine (LANTUS SOLOSTAR) 100 UNIT/ML Solostar Pen Inject 35 Units into the skin daily at 10 pm. 07/15/16  Yes Lonia BloodMcClung, Jeffrey T, MD  insulin glargine (LANTUS) 100 UNIT/ML injection Inject 35 Units into the skin at bedtime.    Yes [provider]  linaclotide (LINZESS) 290 MCG CAPS capsule Take 290 mcg by mouth daily before breakfast.   Yes [provider]  menthol-cetylpyridinium (CEPACOL) 3 MG lozenge Take 1 lozenge (3 mg total) by mouth as needed for sore throat. 09/16/18  Yes Shah, Pratik D, DO  Naphazoline HCl (CLEAR EYES OP) Place 1 drop into both eyes daily as needed (dry eyes/ itching/redness).   Yes [provider]  omeprazole (PRILOSEC) 40 MG capsule Take 40 mg by mouth daily before breakfast.    Yes [provider]  ondansetron (ZOFRAN) 4 MG tablet Take 1 tablet (4 mg total) by mouth every 6 (six) hours. Patient taking differently: Take 4 mg by mouth every 6 (six) hours as needed for nausea or vomiting.  03/20/18  Yes Hedges, Tinnie GensJeffrey, PA-C  sertraline (ZOLOFT) 25 MG tablet Take 25 mg by mouth daily.   Yes [provider]    Physical Exam: Vitals:   10/28/18 1500 10/28/18 1515 10/28/18 1545 10/28/18 1630  BP: 110/71 103/75 96/62 (!) 90/54  Pulse: 91 (!) 109 85 78  Resp: 16 20 17 17   SpO2: 99% 99% 97% 98%  Weight:      Height:        Constitutional: NAD, calm, comfortable Vitals:   10/28/18 1500 10/28/18 1515 10/28/18 1545 10/28/18 1630  BP: 110/71 103/75 96/62 (!) 90/54  Pulse: 91 (!) 109 85 78  Resp: 16 20 17 17   SpO2: 99% 99% 97% 98%  Weight:      Height:       Eyes: PERRL, lids and conjunctivae normal.  Patient is chronically sick looking.  She is thinly built. ENMT: Mucous membranes are moist. Posterior pharynx clear of any exudate or lesions.Normal dentition.  Neck: normal, supple, no masses, no thyromegaly Respiratory: clear to auscultation bilaterally, no  wheezing, no crackles. Normal respiratory effort. No accessory muscle use.  Cardiovascular: Regular rate and rhythm, no murmurs / rubs / gallops. No extremity edema. 2+ pedal pulses. No carotid bruits.  Abdomen: no tenderness, no masses palpated. No hepatosplenomegaly. Bowel sounds positive.  Musculoskeletal: no clubbing / cyanosis. No joint deformity upper and lower extremities. Good ROM, no contractures. Normal muscle tone.  Skin: no rashes, lesions, ulcers. No induration Neurologic: CN 2-12 grossly intact. Sensation intact, DTR normal. Strength 5/5 in all 4.  Psychiatric: Normal judgment and insight. Alert and oriented x 3. Normal mood.     Labs on Admission: I have personally reviewed following labs and imaging studies  CBC: Recent Labs  Lab 10/28/18 1240 10/28/18 1257  WBC 6.1  --   HGB 14.2 16.3*  HCT 45.8 48.0*  MCV 92.5  --   PLT 269  --    Basic Metabolic Panel:  Recent Labs  Lab 10/28/18 1240 10/28/18 1257  NA 128* 127*  K 4.9 4.9  CL 90* 93*  CO2 19*  --   GLUCOSE 778* >700*  BUN 19 23*  CREATININE 1.07* 0.60  CALCIUM 9.3  --    GFR: Estimated Creatinine Clearance: 70.4 mL/min (by C-G formula based on SCr of 0.6 mg/dL). Liver Function Tests: Recent Labs  Lab 10/28/18 1240  AST 18  ALT 27  ALKPHOS 67  BILITOT 0.6  PROT 7.6  ALBUMIN 3.9   No results for input(s): LIPASE, AMYLASE in the last 168 hours. No results for input(s): AMMONIA in the last 168 hours. Coagulation Profile: No results for input(s): INR, PROTIME in the last 168 hours. Cardiac Enzymes: No results for input(s): CKTOTAL, CKMB, CKMBINDEX, TROPONINI in the last 168 hours. BNP (last 3 results) No results for input(s): PROBNP in the last 8760 hours. HbA1C: No results for input(s): HGBA1C in the last 72 hours. CBG: Recent Labs  Lab 10/28/18 1222 10/28/18 1408 10/28/18 1531  GLUCAP >600* 505* 358*   Lipid Profile: No results for input(s): CHOL, HDL, LDLCALC, TRIG, CHOLHDL,  LDLDIRECT in the last 72 hours. Thyroid Function Tests: No results for input(s): TSH, T4TOTAL, FREET4, T3FREE, THYROIDAB in the last 72 hours. Anemia Panel: No results for input(s): VITAMINB12, FOLATE, FERRITIN, TIBC, IRON, RETICCTPCT in the last 72 hours. Urine analysis:    Component Value Date/Time   COLORURINE COLORLESS (A) 10/28/2018 1413   APPEARANCEUR CLEAR 10/28/2018 1413   LABSPEC 1.028 10/28/2018 1413   PHURINE 5.0 10/28/2018 1413   GLUCOSEU >=500 (A) 10/28/2018 1413   HGBUR SMALL (A) 10/28/2018 1413   BILIRUBINUR NEGATIVE 10/28/2018 1413   KETONESUR 20 (A) 10/28/2018 1413   PROTEINUR NEGATIVE 10/28/2018 1413   UROBILINOGEN 1.0 07/28/2015 1402   NITRITE NEGATIVE 10/28/2018 1413   LEUKOCYTESUR SMALL (A) 10/28/2018 1413    Radiological Exams on Admission: Dg Chest 2 View  Result Date: 10/28/2018 CLINICAL DATA:  Shortness of breath. EXAM: CHEST - 2 VIEW COMPARISON:  Chest x-ray dated September 15, 2018. FINDINGS: The heart size and mediastinal contours are within normal limits. Both lungs are clear. The visualized skeletal structures are unremarkable. IMPRESSION: No active cardiopulmonary disease. Electronically Signed   By: Obie Dredge M.D.   On: 10/28/2018 14:33    EKG: Independently reviewed.  Normal sinus rhythm.  She does have some diffuse ST-T wave changes that are nonspecific.  Assessment/Plan Active Problems:   Diabetes mellitus type 1, uncontrolled (HCC)   Nausea & vomiting   Polysubstance abuse (HCC)   Tobacco abuse   GERD (gastroesophageal reflux disease)   Hyperglycemia due to type 1 diabetes mellitus (HCC)   Diabetes type 1, uncontrolled blood sugars with active nausea and vomiting: We will treat with titratable IV insulin given active symptoms and patient not able to eat. Agreed with admission to stepdown unit. Insulin infusion for titration. BMP every 4 hours, magnesium phosphorus every 12 hours. We will check A1c.  Patient will need extensive  counseling to continue to use insulin at home.  Nausea and vomiting: Intractable.  Will keep n.p.o.  Allow ice chips.  Symptomatic treatment with nausea medications and IV fluids.  GERD: On PPI that she will continue.  Smoker: Counseled to quit.  Unlikely to quit.  Nicotine patch prescribed.  Substance abuse: She uses cocaine.  Counseled her.  DVT prophylaxis: Lovenox. Code Status: Full code. Family Communication: No family present. Disposition Plan: Home. Consults called: None. Admission status: Inpatient.   Lyndel Safe  Varetta Chavers MD [facility] Pager [phone]  If 7PM-7AM, please contact night-coverage [email or web address] Password [email or web address]  10/28/2018, 5:11 PM

## 2018-10-28 NOTE — ED Provider Notes (Signed)
  Face-to-face evaluation   History: She presents for evaluation of nausea, vomiting, and high blood sugar present for 2 days.  Has some vague lower abdominal pain present for couple weeks.  She denies chest pain.  States that she has some shortness of breath today, which is mild.  She is upset because her mother is on life support in the hospital and her sister was involved in a serious motor vehicle accident yesterday.  Physical exam: Frail, underweight, uncomfortable.  Heart regular rate and rhythm without murmur lungs clear anteriorly.  Abdomen soft and nontender to palpation.  Lucid.  Knees are without edema.  Medical screening examination/treatment/procedure(s) were conducted as a shared visit with non-physician practitioner(s) and myself.  I personally evaluated the patient during the encounter    Mancel Bale, MD 10/29/18 1055

## 2018-10-29 LAB — BASIC METABOLIC PANEL
Anion gap: 7 (ref 5–15)
Anion gap: 7 (ref 5–15)
BUN: 18 mg/dL (ref 6–20)
BUN: 18 mg/dL (ref 6–20)
CO2: 26 mmol/L (ref 22–32)
CO2: 26 mmol/L (ref 22–32)
Calcium: 8.2 mg/dL — ABNORMAL LOW (ref 8.9–10.3)
Calcium: 8 mg/dL — ABNORMAL LOW (ref 8.9–10.3)
Chloride: 106 mmol/L (ref 98–111)
Chloride: 103 mmol/L (ref 98–111)
Creatinine, Ser: 0.63 mg/dL (ref 0.44–1.00)
Creatinine, Ser: 0.78 mg/dL (ref 0.44–1.00)
GFR calc Af Amer: >60 mL/min (ref >60)
GFR calc Af Amer: >60 mL/min (ref >60)
GFR calc non Af Amer: >60 mL/min (ref >60)
GFR calc non Af Amer: >60 mL/min (ref >60)
Glucose, Bld: 172 mg/dL — ABNORMAL HIGH (ref 70–99)
Glucose, Bld: 179 mg/dL — ABNORMAL HIGH (ref 70–99)
Potassium: 3.6 mmol/L (ref 3.5–5.1)
Potassium: 3.4 mmol/L — ABNORMAL LOW (ref 3.5–5.1)
Sodium: 139 mmol/L (ref 135–145)
Sodium: 136 mmol/L (ref 135–145)

## 2018-10-29 LAB — GLUCOSE, CAPILLARY
GLUCOSE-CAPILLARY: 157 mg/dL — AB (ref 70–99)
GLUCOSE-CAPILLARY: 200 mg/dL — AB (ref 70–99)
Glucose-Capillary: 149 mg/dL — ABNORMAL HIGH (ref 70–99)
Glucose-Capillary: 153 mg/dL — ABNORMAL HIGH (ref 70–99)
Glucose-Capillary: 159 mg/dL — ABNORMAL HIGH (ref 70–99)
Glucose-Capillary: 162 mg/dL — ABNORMAL HIGH (ref 70–99)
Glucose-Capillary: 163 mg/dL — ABNORMAL HIGH (ref 70–99)
Glucose-Capillary: 167 mg/dL — ABNORMAL HIGH (ref 70–99)
Glucose-Capillary: 169 mg/dL — ABNORMAL HIGH (ref 70–99)
Glucose-Capillary: 172 mg/dL — ABNORMAL HIGH (ref 70–99)
Glucose-Capillary: 193 mg/dL — ABNORMAL HIGH (ref 70–99)
Glucose-Capillary: 269 mg/dL — ABNORMAL HIGH (ref 70–99)

## 2018-10-29 LAB — CBC WITH DIFFERENTIAL/PLATELET
Abs Immature Granulocytes: 0.01 10*3/uL (ref 0.00–0.07)
Basophils Absolute: 0 10*3/uL (ref 0.0–0.1)
Basophils Relative: 1 %
Eosinophils Absolute: 0.1 10*3/uL (ref 0.0–0.5)
Eosinophils Relative: 1 %
HCT: 37.1 % (ref 36.0–46.0)
Hemoglobin: 12.3 g/dL (ref 12.0–15.0)
Immature Granulocytes: 0 %
Lymphocytes Relative: 53 %
Lymphs Abs: 3.5 10*3/uL (ref 0.7–4.0)
MCH: 29.9 pg (ref 26.0–34.0)
MCHC: 33.2 g/dL (ref 30.0–36.0)
MCV: 90.3 fL (ref 80.0–100.0)
Monocytes Absolute: 0.5 10*3/uL (ref 0.1–1.0)
Monocytes Relative: 8 %
NEUTROS PCT: 37 %
Neutro Abs: 2.5 10*3/uL (ref 1.7–7.7)
PLATELETS: 282 10*3/uL (ref 150–400)
RBC: 4.11 MIL/uL (ref 3.87–5.11)
RDW: 14.9 % (ref 11.5–15.5)
WBC: 6.6 10*3/uL (ref 4.0–10.5)
nRBC: 0 % (ref 0.0–0.2)

## 2018-10-29 LAB — PHOSPHORUS: PHOSPHORUS: 2.4 mg/dL — AB (ref 2.5–4.6)

## 2018-10-29 LAB — MAGNESIUM: Magnesium: 2 mg/dL (ref 1.7–2.4)

## 2018-10-29 MED ORDER — INSULIN ASPART 100 UNIT/ML ~~LOC~~ SOLN
0.0000 [IU] | Freq: Three times a day (TID) | SUBCUTANEOUS | Status: DC
  Administered 2018-10-29 (×2): 2 [IU] via SUBCUTANEOUS

## 2018-10-29 MED ORDER — INSULIN ASPART 100 UNIT/ML ~~LOC~~ SOLN: 0.0000 [IU] | Freq: Every day | SUBCUTANEOUS | Status: DC

## 2018-10-29 MED ORDER — SODIUM CHLORIDE 0.9 % IV SOLN: INTRAVENOUS | Status: AC

## 2018-10-29 MED ORDER — INSULIN GLARGINE 100 UNIT/ML ~~LOC~~ SOLN
35.0000 [IU] | Freq: Every day | SUBCUTANEOUS | Status: DC
  Administered 2018-10-29: 35 [IU] via SUBCUTANEOUS
  Filled 2018-10-29: qty 0.35

## 2018-10-29 NOTE — Progress Notes (Signed)
PROGRESS NOTE    [name]  FHQ:[id number] DOB: 1986-02-21 DOA: 10/28/2018 PCP: [facility]  Brief Narrative: 33 year old female with type 1 diabetes, polysubstance abuse, cocaine, tobacco use, recurrent DKA, noncompliance admitted from the ED yesterday with nausea vomiting hyperglycemia and DKA  Assessment & Plan:   Diabetes type 1, uncontrolled blood sugars -Improving, anion gap has corrected will transition to Lantus, sliding scale insulin -Discontinue insulin drip -Change fluids to saline at 75 an hour for 6 more hours -Patient admitted not taking Lantus for the last week -Discussed importance of compliance with long-acting insulin -Diabetes coordinator consult -Follow-up hemoglobin A1c  Nausea and vomiting -Due to DKA, resolved  GERD -Continue PPI  Cocaine/polysubstance abuse -Counseled  Tobacco abuse -Counseled, nicotine patch  DVT prophylaxis: Lovenox. Code Status: Full code. Family Communication: No family present. Disposition Plan: Home.  Consultants:      Procedures:   Antimicrobials:    Subjective: -Feels much better, nausea and vomiting improving  Objective: Vitals:   10/28/18 2022 10/29/18 0010 10/29/18 0012 10/29/18 0500  BP: 100/61 (!) 94/58 101/67 (!) 94/59  Pulse: 76 69    Resp: 16 15    Temp: 98.3 F (36.8 C) 98.2 F (36.8 C)  98.2 F (36.8 C)  TempSrc: Oral Oral  Oral  SpO2: 93% 97%    Weight:      Height:        Intake/Output Summary (Last 24 hours) at 10/29/2018 1023 Last data filed at 10/29/2018 0900 Gross per 24 hour  Intake 2098.17 ml  Output -  Net 2098.17 ml   Filed Weights   10/28/18 1227  Weight: 45 kg    Examination:  General exam: Appears calm and comfortable, cachectic, chronically ill-appearing Respiratory system: Clear to auscultation. Respiratory effort normal. Cardiovascular system: S1 & S2 heard, RRR Gastrointestinal system: Abdomen is nondistended, soft and nontender.Normal bowel  sounds heard. Central nervous system: Alert and oriented. No focal neurological deficits. Extremities: No edema Skin: No rashes, lesions or ulcers Psychiatry: Judgement and insight appear normal. Mood & affect appropriate.     Data Reviewed:   CBC: Recent Labs  Lab 10/28/18 1240 10/28/18 1257 10/29/18 0509  WBC 6.1  --  6.6  NEUTROABS  --   --  2.5  HGB 14.2 16.3* 12.3  HCT 45.8 48.0* 37.1  MCV 92.5  --  90.3  PLT 269  --  282   Basic Metabolic Panel: Recent Labs  Lab 10/28/18 1240 10/28/18 1257 10/28/18 1901 10/29/18 0033 10/29/18 0509  NA 128* 127* 138 139 136  K 4.9 4.9 4.0 3.6 3.4*  CL 90* 93* 106 106 103  CO2 19*  --  26 26 26   GLUCOSE 778* >700* 150* 172* 179*  BUN 19 23* 16 18 18   CREATININE 1.07* 0.60 0.64 0.63 0.78  CALCIUM 9.3  --  8.5* 8.2* 8.0*  MG  --   --   --   --  2.0  PHOS  --   --   --   --  2.4*   GFR: Estimated Creatinine Clearance: 70.4 mL/min (by C-G formula based on SCr of 0.78 mg/dL). Liver Function Tests: Recent Labs  Lab 10/28/18 1240  AST 18  ALT 27  ALKPHOS 67  BILITOT 0.6  PROT 7.6  ALBUMIN 3.9   No results for input(s): LIPASE, AMYLASE in the last 168 hours. No results for input(s): AMMONIA in the last 168 hours. Coagulation Profile: No results for input(s): INR, PROTIME in the last 168 hours.  Cardiac Enzymes: No results for input(s): CKTOTAL, CKMB, CKMBINDEX, TROPONINI in the last 168 hours. BNP (last 3 results) No results for input(s): PROBNP in the last 8760 hours. HbA1C: Recent Labs    10/28/18 1901  HGBA1C 11.5*   CBG: Recent Labs  Lab 10/29/18 0503 10/29/18 0645 10/29/18 0750 10/29/18 0852 10/29/18 1012  GLUCAP 169* 149* 159* 157* 269*   Lipid Profile: No results for input(s): CHOL, HDL, LDLCALC, TRIG, CHOLHDL, LDLDIRECT in the last 72 hours. Thyroid Function Tests: No results for input(s): TSH, T4TOTAL, FREET4, T3FREE, THYROIDAB in the last 72 hours. Anemia Panel: No results for input(s):  VITAMINB12, FOLATE, FERRITIN, TIBC, IRON, RETICCTPCT in the last 72 hours. Urine analysis:    Component Value Date/Time   COLORURINE COLORLESS (A) 10/28/2018 1413   APPEARANCEUR CLEAR 10/28/2018 1413   LABSPEC 1.028 10/28/2018 1413   PHURINE 5.0 10/28/2018 1413   GLUCOSEU >=500 (A) 10/28/2018 1413   HGBUR SMALL (A) 10/28/2018 1413   BILIRUBINUR NEGATIVE 10/28/2018 1413   KETONESUR 20 (A) 10/28/2018 1413   PROTEINUR NEGATIVE 10/28/2018 1413   UROBILINOGEN 1.0 07/28/2015 1402   NITRITE NEGATIVE 10/28/2018 1413   LEUKOCYTESUR SMALL (A) 10/28/2018 1413   Sepsis Labs: @LABRCNTIP (procalcitonin:4,lacticidven:4)  ) Recent Results (from the past 240 hour(s))  MRSA PCR Screening     Status: None   Collection Time: 10/28/18  6:57 PM  Result Value Ref Range Status   MRSA by PCR NEGATIVE NEGATIVE Final    Comment:        The GeneXpert MRSA Assay (FDA approved for NASAL specimens only), is one component of a comprehensive MRSA colonization surveillance program. It is not intended to diagnose MRSA infection nor to guide or monitor treatment for MRSA infections. Performed at [facility], [address] [place], [place] [address]          Radiology Studies: Dg Chest 2 View  Result Date: 10/28/2018 CLINICAL DATA:  Shortness of breath. EXAM: CHEST - 2 VIEW COMPARISON:  Chest x-ray dated September 15, 2018. FINDINGS: The heart size and mediastinal contours are within normal limits. Both lungs are clear. The visualized skeletal structures are unremarkable. IMPRESSION: No active cardiopulmonary disease. Electronically Signed   By: Obie Dredge M.D.   On: 10/28/2018 14:33        Scheduled Meds: . enoxaparin (LOVENOX) injection  40 mg Subcutaneous Q24H  . gabapentin  200 mg Oral QHS  . insulin aspart  0-5 Units Subcutaneous QHS  . insulin aspart  0-9 Units Subcutaneous TID WC  . insulin glargine  35 Units Subcutaneous Daily  . nicotine  7 mg Transdermal Daily  .  pantoprazole  40 mg Oral Daily  . sertraline  25 mg Oral Daily   Continuous Infusions: . sodium chloride 75 mL/hr at 10/29/18 1022     LOS: 1 day    Time spent:    Zannie Cove, MD [facility] Page via [email or web address], password [email or web address] After 7PM please contact night-coverage  10/29/2018, 10:23 AM

## 2018-10-29 NOTE — Progress Notes (Signed)
Inpatient Diabetes Program Recommendations  AACE/ADA: New Consensus Statement on Inpatient Glycemic Control (2015)  Target Ranges:  Prepandial:   less than 140 mg/dL      Peak postprandial:   less than 180 mg/dL (1-2 hours)      Critically ill patients:  140 - 180 mg/dL   Lab Results  Component Value Date   GLUCAP 200 (H) 10/29/2018   HGBA1C 11.5 (H) 10/28/2018    Review of Glycemic ControlResults for [name], PAS (MRN [id number]) as of 10/29/2018 16:09  Ref. Range 10/29/2018 07:50 10/29/2018 08:52 10/29/2018 10:12 10/29/2018 11:23  Glucose-Capillary Latest Ref Range: 70 - 99 mg/dL 032 (H) 122 (H) 482 (H) 200 (H)    Diabetes history: DM 1 Outpatient Diabetes medications:  Lantus 35 units q HS, Novolog 2- 10 units tid with meals ( does not give unless blood sugars >201 mg/dL) Current orders for Inpatient glycemic control:  Lantus 35 units daily, Novolog sensitive tid with meals and HS  Inpatient Diabetes Program Recommendations:    Note that patient admitted with DKA.  She states that she did "miss a dose" of Lantus and that she has been under a lot of stress recently with her mother being in [place] in the ICU. She states that she has recently lost 10 pounds and wants to regain weight.  We discussed that when blood sugars are elevated, weight loss is increased.  Discussed importance of glycemic control and control of A1C.  Patient says she has had DM >17 years.  She states that she sometimes has low blood sugars first thing in the morning.  We discussed that this is likely due to Lantus dose.  I also told her she may need Novolog meal coverage to prevent rises in blood sugars from CHO intake.  She see's MD at the [facility] and has Medicaid which pays for insulin. Again patient tearful about her mother's current condition.  Discussed with RN.    MD, consider adding Novolog 3 units tid with meals (hold if patient eats less than 50%).  Also may consider reducing Lantus to 30 units daily.   Will follow.  Thanks  Beryl Meager, RN, BC-ADM Inpatient Diabetes Coordinator Pager [phone] (8a-5p)

## 2018-10-29 NOTE — Progress Notes (Signed)
Date: 10/29/2018 Patient: [name] Attending Provider: Zannie Cove, MD  [name] has made the decision to leave the emergency department against the advice of Zannie Cove, MD.  She has been informed and understands the inherent risks, including death.  She  has decided to accept the responsibility for this decision. [name] and all necessary parties have been advised that she may return for further evaluation or treatment.   [name] had current vital signs as follows:  Blood pressure 92/74, pulse 91, temperature 97.6 F (36.4 C), temperature source Oral, resp. rate 15, height 5' (1.524 m), weight 45 kg, last menstrual period 10/08/2018, SpO2 100 %.   [name] has signed the Leaving Against Medical Advice form prior to leaving the department and placed in chart. Dr. Jomarie Longs notified of patient's status.   [name] [facility] 10/29/2018

## 2018-11-11 NOTE — Discharge Summary (Signed)
Physician Discharge Summary  [name] TMH:[id number] DOB: 1986-07-22 DOA: 10/28/2018  PCP: [facility]  Admit date: 10/28/2018 Discharge date: 10/29/2018  Time spent: 35 minutes  Recommendations for Outpatient Follow-up:  1. Left AGAINST MEDICAL ADVICE   Discharge Diagnoses:  Active Problems:   Diabetes mellitus type 1, uncontrolled (HCC)   Nausea & vomiting   Polysubstance abuse (HCC)   Tobacco abuse   GERD (gastroesophageal reflux disease)   Hyperglycemia due to type 1 diabetes mellitus [facility])   Discharge Condition:   Diet recommendation:   Filed Weights   10/28/18 1227  Weight: 45 kg    History of present illness:  33 year old female with type 1 diabetes, polysubstance abuse, cocaine, tobacco use, recurrent DKA, noncompliance admitted from the ED yesterday with nausea vomiting hyperglycemia and DKA  Hospital Course:   Diabetes type 1, uncontrolled blood sugars -Treated with insulin drip, IV fluids, supportive care  -Was transitioned to Lantus today, within 2 hours of coming off insulin drip decided to leave AGAINST MEDICAL ADVICE -counseled regarding compliance with medications  Nausea and vomiting -Due to DKA, resolved  GERD -Continue PPI  Cocaine/polysubstance abuse -Counseled  Tobacco abuse -Counseled, nicotine patch  Discharge Exam: Vitals:   10/29/18 0500 10/29/18 1658  BP: (!) 94/59 92/74  Pulse:  91  Resp:    Temp: 98.2 F (36.8 C) 97.6 F (36.4 C)  SpO2:  100%     Discharge Instructions    Allergies as of 10/29/2018      Reactions   Codeine Anaphylaxis, Nausea And Vomiting   Penicillins Anaphylaxis, Nausea And Vomiting   Has patient had a PCN reaction causing immediate rash, facial/tongue/throat swelling, SOB or lightheadedness with hypotension: Yes Has patient had a PCN reaction causing severe rash involving mucus membranes or skin necrosis: Yes Has patient had a PCN reaction that required hospitalization  Yes Has patient had a PCN reaction occurring within the last 10 years: No If all of the above answers are "NO", then may proceed with Cephalosporin use.   Tramadol Itching      Medication List    ASK your doctor about these medications   acetaminophen 500 MG tablet Commonly known as:  TYLENOL Take 1,500 mg by mouth every 6 (six) hours as needed for mild pain, moderate pain or fever.   CLEAR EYES OP Place 1 drop into both eyes daily as needed (dry eyes/ itching/redness).   gabapentin 100 MG capsule Commonly known as:  NEURONTIN Take 200 mg by mouth at bedtime.   insulin glargine 100 UNIT/ML injection Commonly known as:  LANTUS Inject 35 Units into the skin at bedtime.   Insulin Glargine 100 UNIT/ML Solostar Pen Commonly known as:  LANTUS SOLOSTAR Inject 35 Units into the skin daily at 10 pm.   LINZESS 290 MCG Caps capsule Generic drug:  linaclotide Take 290 mcg by mouth daily before breakfast.   menthol-cetylpyridinium 3 MG lozenge Commonly known as:  CEPACOL Take 1 lozenge (3 mg total) by mouth as needed for sore throat.   NOVOLOG FLEXPEN 100 UNIT/ML FlexPen Generic drug:  insulin aspart Inject 1-7 Units into the skin 3 (three) times daily with meals. Dose based on CBG and carb count   omeprazole 40 MG capsule Commonly known as:  PRILOSEC Take 40 mg by mouth daily before breakfast.   ondansetron 4 MG tablet Commonly known as:  ZOFRAN Take 1 tablet (4 mg total) by mouth every 6 (six) hours.   sertraline 25 MG tablet Commonly known as:  ZOLOFT Take 25 mg by mouth daily.      Allergies  Allergen Reactions  . Codeine Anaphylaxis and Nausea And Vomiting  . Penicillins Anaphylaxis and Nausea And Vomiting    Has patient had a PCN reaction causing immediate rash, facial/tongue/throat swelling, SOB or lightheadedness with hypotension: Yes Has patient had a PCN reaction causing severe rash involving mucus membranes or skin necrosis: Yes Has patient had a PCN reaction  that required hospitalization Yes Has patient had a PCN reaction occurring within the last 10 years: No If all of the above answers are "NO", then may proceed with Cephalosporin use.   . Tramadol Itching      The results of significant diagnostics from this hospitalization (including imaging, microbiology, ancillary and laboratory) are listed below for reference.    Significant Diagnostic Studies: Dg Chest 2 View  Result Date: 10/28/2018 CLINICAL DATA:  Shortness of breath. EXAM: CHEST - 2 VIEW COMPARISON:  Chest x-ray dated September 15, 2018. FINDINGS: The heart size and mediastinal contours are within normal limits. Both lungs are clear. The visualized skeletal structures are unremarkable. IMPRESSION: No active cardiopulmonary disease. Electronically Signed   By: Obie Dredge M.D.   On: 10/28/2018 14:33    Microbiology: No results found for this or any previous visit (from the past 240 hour(s)).   Labs: Basic Metabolic Panel: No results for input(s): NA, K, CL, CO2, GLUCOSE, BUN, CREATININE, CALCIUM, MG, PHOS in the last 168 hours. Liver Function Tests: No results for input(s): AST, ALT, ALKPHOS, BILITOT, PROT, ALBUMIN in the last 168 hours. No results for input(s): LIPASE, AMYLASE in the last 168 hours. No results for input(s): AMMONIA in the last 168 hours. CBC: No results for input(s): WBC, NEUTROABS, HGB, HCT, MCV, PLT in the last 168 hours. Cardiac Enzymes: No results for input(s): CKTOTAL, CKMB, CKMBINDEX, TROPONINI in the last 168 hours. BNP: BNP (last 3 results) No results for input(s): BNP in the last 8760 hours.  ProBNP (last 3 results) No results for input(s): PROBNP in the last 8760 hours.  CBG: No results for input(s): GLUCAP in the last 168 hours.     Signed:  Zannie Cove MD.  [facility] 11/11/2018, 4:30 PM

## 2019-02-28 ENCOUNTER — Other Ambulatory Visit: Payer: Self-pay

## 2019-02-28 ENCOUNTER — Emergency Department (HOSPITAL_COMMUNITY): Payer: Medicaid Other

## 2019-02-28 ENCOUNTER — Emergency Department (HOSPITAL_COMMUNITY)
Admission: EM | Admit: 2019-02-28 | Discharge: 2019-02-28 | Disposition: A | Discharge status: Home / Self Care | Payer: Medicaid Other | Attending: Emergency Medicine | Admitting: Emergency Medicine

## 2019-02-28 ENCOUNTER — Encounter (HOSPITAL_COMMUNITY): Payer: Self-pay | Admitting: *Deleted

## 2019-02-28 DIAGNOSIS — Z03818 Encounter for observation for suspected exposure to other biological agents ruled out: Secondary | ICD-10-CM | POA: Insufficient documentation

## 2019-02-28 DIAGNOSIS — L02413 Cutaneous abscess of right upper limb: Secondary | ICD-10-CM

## 2019-02-28 DIAGNOSIS — Z79899 Other long term (current) drug therapy: Secondary | ICD-10-CM | POA: Insufficient documentation

## 2019-02-28 DIAGNOSIS — E109 Type 1 diabetes mellitus without complications: Secondary | ICD-10-CM | POA: Diagnosis not present

## 2019-02-28 DIAGNOSIS — F17210 Nicotine dependence, cigarettes, uncomplicated: Secondary | ICD-10-CM | POA: Diagnosis not present

## 2019-02-28 DIAGNOSIS — N390 Urinary tract infection, site not specified: Secondary | ICD-10-CM | POA: Insufficient documentation

## 2019-02-28 DIAGNOSIS — R509 Fever, unspecified: Secondary | ICD-10-CM | POA: Diagnosis present

## 2019-02-28 LAB — GROUP A STREP BY PCR: Group A Strep by PCR: NOT DETECTED

## 2019-02-28 LAB — URINALYSIS, ROUTINE W REFLEX MICROSCOPIC
Bilirubin Urine: NEGATIVE
Glucose, UA: NEGATIVE mg/dL
Ketones, ur: NEGATIVE mg/dL
Nitrite: POSITIVE — AB
Protein, ur: NEGATIVE mg/dL
Specific Gravity, Urine: 1.006 (ref 1.005–1.030)
pH: 6 (ref 5.0–8.0)

## 2019-02-28 LAB — CBC WITH DIFFERENTIAL/PLATELET
Abs Immature Granulocytes: 0.28 10*3/uL — ABNORMAL HIGH (ref 0.00–0.07)
Basophils Absolute: 0.1 10*3/uL (ref 0.0–0.1)
Basophils Relative: 1 %
Eosinophils Absolute: 0 10*3/uL (ref 0.0–0.5)
Eosinophils Relative: 0 %
HCT: 43.2 % (ref 36.0–46.0)
Hemoglobin: 14.7 g/dL (ref 12.0–15.0)
Immature Granulocytes: 2 %
Lymphocytes Relative: 13 %
Lymphs Abs: 1.5 10*3/uL (ref 0.7–4.0)
MCH: 29.5 pg (ref 26.0–34.0)
MCHC: 34 g/dL (ref 30.0–36.0)
MCV: 86.7 fL (ref 80.0–100.0)
Monocytes Absolute: 0.9 10*3/uL (ref 0.1–1.0)
Monocytes Relative: 7 %
Neutro Abs: 9.5 10*3/uL — ABNORMAL HIGH (ref 1.7–7.7)
Neutrophils Relative %: 77 %
Platelets: 249 10*3/uL (ref 150–400)
RBC: 4.98 MIL/uL (ref 3.87–5.11)
RDW: 14.1 % (ref 11.5–15.5)
WBC: 12.3 10*3/uL — ABNORMAL HIGH (ref 4.0–10.5)
nRBC: 0 % (ref 0.0–0.2)

## 2019-02-28 LAB — BASIC METABOLIC PANEL
Anion gap: 8 (ref 5–15)
BUN: 14 mg/dL (ref 6–20)
CO2: 24 mmol/L (ref 22–32)
Calcium: 7.5 mg/dL — ABNORMAL LOW (ref 8.9–10.3)
Chloride: 100 mmol/L (ref 98–111)
Creatinine, Ser: 0.71 mg/dL (ref 0.44–1.00)
GFR calc Af Amer: >60 mL/min (ref >60)
GFR calc non Af Amer: >60 mL/min (ref >60)
Glucose, Bld: 64 mg/dL — ABNORMAL LOW (ref 70–99)
Potassium: 3.5 mmol/L (ref 3.5–5.1)
Sodium: 132 mmol/L — ABNORMAL LOW (ref 135–145)

## 2019-02-28 MED ORDER — IBUPROFEN 400 MG PO TABS
400.0000 mg | ORAL_TABLET | Freq: Once | ORAL | Status: AC
  Administered 2019-02-28: 13:00:00 400 mg via ORAL
  Filled 2019-02-28: qty 1

## 2019-02-28 MED ORDER — NITROFURANTOIN MONOHYD MACRO 100 MG PO CAPS: 100.0000 mg | ORAL_CAPSULE | Freq: Two times a day (BID) | ORAL | 0 refills | Status: DC

## 2019-02-28 MED ORDER — DOXYCYCLINE HYCLATE 100 MG PO TABS
100.0000 mg | ORAL_TABLET | Freq: Once | ORAL | Status: AC
  Administered 2019-02-28: 13:00:00 100 mg via ORAL
  Filled 2019-02-28: qty 1

## 2019-02-28 MED ORDER — NITROFURANTOIN MONOHYD MACRO 100 MG PO CAPS
100.0000 mg | ORAL_CAPSULE | Freq: Once | ORAL | Status: AC
  Administered 2019-02-28: 15:00:00 100 mg via ORAL
  Filled 2019-02-28: qty 1

## 2019-02-28 MED ORDER — DOXYCYCLINE HYCLATE 100 MG PO CAPS: 100.0000 mg | ORAL_CAPSULE | Freq: Two times a day (BID) | ORAL | 0 refills | Status: DC

## 2019-02-28 MED ORDER — PROMETHAZINE HCL 12.5 MG PO TABS
12.5000 mg | ORAL_TABLET | Freq: Once | ORAL | Status: AC
  Administered 2019-02-28: 13:00:00 12.5 mg via ORAL
  Filled 2019-02-28: qty 1

## 2019-02-28 MED ORDER — HYDROCODONE-ACETAMINOPHEN 5-325 MG PO TABS
2.0000 | ORAL_TABLET | Freq: Once | ORAL | Status: AC
  Administered 2019-02-28: 13:00:00 2 via ORAL
  Filled 2019-02-28: qty 2

## 2019-02-28 MED ORDER — LIDOCAINE HCL (PF) 2 % IJ SOLN
INTRAMUSCULAR | Status: AC
  Administered 2019-02-28: 13:00:00
  Filled 2019-02-28: qty 20

## 2019-02-28 MED ORDER — HYDROCODONE-ACETAMINOPHEN 5-325 MG PO TABS: 1.0000 | ORAL_TABLET | ORAL | 0 refills | Status: DC | PRN

## 2019-02-28 NOTE — Discharge Instructions (Signed)
Your labs show evidence of a urinary tract infection.  Please use Macrobid 2 times daily with food until all taken.  Your abscess was drained today.  A culture has been sent to the lab.  Please use doxycycline 2 times daily with food until all taken.  Use Tylenol every 4 hours, or ibuprofen every 6 hours for generalized soreness.  Use Norco for more severe pain. This medication may cause drowsiness. Please do not drink, drive, or participate in activity that requires concentration while taking this medication.  Please see your primary physician, urgent care, or the health department to have your urine rechecked in 7 to 10 days to ensure that the infection has completely resolved.

## 2019-02-28 NOTE — ED Provider Notes (Signed)
[facility] EMERGENCY DEPARTMENT Provider Note   CSN: [id number] Arrival date & time: 02/28/19  1127    History   Chief Complaint Chief Complaint  Patient presents with  . Fever    HPI [name] is a 33 y.o. female.     Patient is a 33 year old female who presents to the emergency department with a complaint of fever, sore throat from, and an abscess.  The patient states this problem started about 3 days ago.  2 days ago she noted temperature elevation with a temperature maximum of 104.  The patient states that she noticed an abscess about 3 to 4 days ago on her right forearm.  She says it is not draining, but it is very painful.  She is not sure how she acquired this abscess as she denies any known bites.  She says that she has not had anything to puncture her skin.  She denies the use of any injectable drugs.  The patient says that she has increased redness of the back of her throat.  She is able to get liquids down, but food is uncomfortable.  She has not had any diarrhea.  She has not had any vomiting.  There is no pain with urination. There has been increase back pain that is worse than usual. There is been no blood in urine or in the stools recently.  The patient denies any known contact with patients positive for COVID-19 virus.  She has not been doing any traveling recently.  It is of note that the patient is a type I diabetic.  The history is provided by the patient.    Past Medical History:  Diagnosis Date  . Diabetes mellitus   . DKA 05/01/2011  . Polysubstance abuse [facility])     Patient Active Problem List   Diagnosis Date Noted  . Hyperglycemia due to type 1 diabetes mellitus (HCC) 10/28/2018  . Polysubstance abuse (HCC) 09/15/2018  . Tobacco abuse 09/15/2018  . Cough 09/15/2018  . Protein-calorie malnutrition, severe (HCC) 09/15/2018  . GERD (gastroesophageal reflux disease) 09/15/2018  . Nausea & vomiting   . Acute lower UTI 07/14/2016  . Panic  disorder without agoraphobia with moderate panic attacks 01/20/2016  . Benzodiazepine abuse (HCC) 01/20/2016  . Substance induced mood disorder (HCC) 01/19/2016  . DKA 05/01/2011  . Diabetes mellitus type 1, uncontrolled (HCC) 05/01/2011  . Leukocytosis 05/01/2011    Past Surgical History:  Procedure Laterality Date  . TUBAL LIGATION    . WOUND EXPLORATION Left 07/26/2014   Procedure: Irrigation, debridement and exploration of elbow wounds;  Surgeon: Betha Loa, MD;  Location: [facility] OR;  Service: Orthopedics;  Laterality: Left;     OB History    Gravida  2   Para  2   Term      Preterm      AB      Living        SAB      TAB      Ectopic      Multiple      Live Births               Home Medications    Prior to Admission medications   Medication Sig Start Date End Date Taking? Authorizing Provider  acetaminophen (TYLENOL) 500 MG tablet Take 1,500 mg by mouth every 6 (six) hours as needed for mild pain, moderate pain or fever.     [provider]  gabapentin (NEURONTIN) 100 MG capsule Take  200 mg by mouth at bedtime. 08/24/18   [provider]  insulin aspart (NOVOLOG FLEXPEN) 100 UNIT/ML FlexPen Inject 1-7 Units into the skin 3 (three) times daily with meals. Dose based on CBG and carb count    [provider]  Insulin Glargine (LANTUS SOLOSTAR) 100 UNIT/ML Solostar Pen Inject 35 Units into the skin daily at 10 pm. 07/15/16   Lonia Blood, MD  insulin glargine (LANTUS) 100 UNIT/ML injection Inject 35 Units into the skin at bedtime.     [provider]  linaclotide (LINZESS) 290 MCG CAPS capsule Take 290 mcg by mouth daily before breakfast.    [provider]  menthol-cetylpyridinium (CEPACOL) 3 MG lozenge Take 1 lozenge (3 mg total) by mouth as needed for sore throat. 09/16/18   Sherryll Burger, Pratik D, DO  Naphazoline HCl (CLEAR EYES OP) Place 1 drop into both eyes daily as needed (dry eyes/ itching/redness).    [provider]  omeprazole (PRILOSEC) 40 MG capsule Take 40 mg by mouth daily before breakfast.     [provider]  ondansetron (ZOFRAN) 4 MG tablet Take 1 tablet (4 mg total) by mouth every 6 (six) hours. Patient taking differently: Take 4 mg by mouth every 6 (six) hours as needed for nausea or vomiting.  03/20/18   Hedges, Tinnie Gens, PA-C  sertraline (ZOLOFT) 25 MG tablet Take 25 mg by mouth daily.    [provider]    Family History Family History  Problem Relation Age of Onset  . Coronary artery disease Mother   . Lung cancer Mother   . Heart attack Father 87  . Thyroid disease Maternal Grandmother   . Diabetes type I Daughter     Social History Social History   Tobacco Use  . Smoking status: Current Every Day Smoker    Packs/day: 1.00    Years: 15.00    Pack years: 15.00    Types: Cigarettes  . Smokeless tobacco: Never Used  . Tobacco comment: She is currently trying to quit.  Substance Use Topics  . Alcohol use: No  . Drug use: No    Comment: HX of cocaine and marijuana use     Allergies   Codeine; Penicillins; and Tramadol   Review of Systems Review of Systems  Constitutional: Positive for chills and fever. Negative for activity change.       All ROS Neg except as noted in HPI  HENT: Positive for sore throat. Negative for nosebleeds.   Eyes: Negative for photophobia and discharge.  Respiratory: Negative for cough, shortness of breath and wheezing.   Cardiovascular: Negative for chest pain and palpitations.  Gastrointestinal: Negative for abdominal pain and blood in stool.  Genitourinary: Negative for dysuria, frequency and hematuria.  Musculoskeletal: Positive for back pain and myalgias. Negative for arthralgias and neck pain.  Skin: Negative.        abscess  Neurological: Negative for dizziness, seizures and speech difficulty.  Psychiatric/Behavioral: Negative for confusion and hallucinations.     Physical Exam Updated Vital Signs BP  129/83 (BP Location: Right Arm)   Pulse (!) 106   Temp 97.8 F (36.6 C) (Oral)   Resp 18   Ht 5' (1.524 m)   Wt 47.6 kg   LMP 02/24/2019 (Approximate)   SpO2 99%   BMI 20.51 kg/m   Physical Exam Vitals signs and nursing note reviewed.  Constitutional:      Appearance: She is well-developed. She is not toxic-appearing.  HENT:  Head: Normocephalic.     Right Ear: Tympanic membrane and external ear normal.     Left Ear: Tympanic membrane and external ear normal.     Mouth/Throat:     Pharynx: Posterior oropharyngeal erythema present.     Comments: There is increased redness of the uvula.  Uvula swollen, but midline.  Airway is patent. Eyes:     General: Lids are normal.     Pupils: Pupils are equal, round, and reactive to light.  Neck:     Musculoskeletal: Normal range of motion and neck supple.     Vascular: No carotid bruit.  Cardiovascular:     Rate and Rhythm: Regular rhythm. Tachycardia present.     Pulses: Normal pulses.     Heart sounds: Normal heart sounds.  Pulmonary:     Effort: No respiratory distress.     Breath sounds: Normal breath sounds.  Abdominal:     General: Bowel sounds are normal.     Palpations: Abdomen is soft.     Tenderness: There is no abdominal tenderness. There is no right CVA tenderness or guarding.  Musculoskeletal: Normal range of motion.     Lumbar back: She exhibits pain.  Lymphadenopathy:     Head:     Right side of head: No submandibular adenopathy.     Left side of head: No submandibular adenopathy.     Cervical: No cervical adenopathy.  Skin:    General: Skin is warm and dry.     Comments: 2.6 cm abscess of the palmar aspect of the right forearm.  Increased redness present, but no red streaking noted.  No drainage at this time.  Neurological:     Mental Status: She is alert and oriented to person, place, and time.     Cranial Nerves: No cranial nerve deficit.     Sensory: No sensory deficit.  Psychiatric:        Speech:  Speech normal.      ED Treatments / Results  Labs (all labs ordered are listed, but only abnormal results are displayed) Labs Reviewed - No data to display  EKG None  Radiology No results found.  Procedures .[name] KitchenIncision and Drainage Date/Time: 02/28/2019 1:20 PM Performed by: Ivery Quale, PA-C Authorized by: Ivery Quale, PA-C   Consent:    Consent obtained:  Verbal   Consent given by:  Patient   Risks discussed:  Bleeding, incomplete drainage, infection and pain   Alternatives discussed:  Referral Universal protocol:    Procedure explained and questions answered to patient or proxy's satisfaction: yes     Immediately prior to procedure a time out was called: yes     Patient identity confirmed:  Arm band Location:    Type:  Abscess   Location:  Upper extremity   Upper extremity location:  Arm   Arm location:  R lower arm Pre-procedure details:    Skin preparation:  Betadine Anesthesia (see MAR for exact dosages):    Anesthesia method:  Local infiltration   Local anesthetic:  Lidocaine 2% w/o epi Procedure type:    Complexity:  Simple Procedure details:    Incision types:  Single straight   Incision depth:  Subcutaneous   Scalpel blade:  11   Wound management:  Probed and deloculated and irrigated with saline   Drainage:  Bloody and purulent   Drainage amount:  Moderate   Wound treatment:  Wound left open   Packing materials:  None Post-procedure details:    Patient tolerance of  procedure:  Tolerated well, no immediate complications   (including critical care time)  Medications Ordered in ED Medications - No data to display   Initial Impression / Assessment and Plan / ED Course  I have reviewed the triage vital signs and the nursing notes.  Pertinent labs & imaging results that were available during my care of the patient were reviewed by me and considered in my medical decision making (see chart for details).          Final Clinical  Impressions(s) / ED Diagnoses MDM  Vital signs reviewed.  Pulse oximetry is 99% on room air.  Within normal limits by my interpretation.  The patient presents to the emergency department with fever, sore throat, and an abscess of the right forearm.  During the examination she also complains of some back pain.  Patient reports maximum temperature of 104.  Rapid strep obtained, COVID-19 also obtained.  Urine analysis obtained.  Chest x-ray.  We will obtain a culture of the abscess of the arm.  The patient denies any recent operations or procedures.  She says that she does not participate in any IV drug use.  There is been no recent changes in antibiotics or diarrhea.  No recent problems with vomiting, and no dysuria.  It is of note that the patient is a type I diabetic.  Incision and drainage carried out.  Culture sent to the lab.  Patient started on antibiotics and medication for pain discomfort. Pt to return if any changes or problem.   Final diagnoses:  Abscess of right forearm  Urinary tract infection without hematuria, site unspecified    ED Discharge Orders         Ordered    doxycycline (VIBRAMYCIN) 100 MG capsule  2 times daily     02/28/19 1457    nitrofurantoin, macrocrystal-monohydrate, (MACROBID) 100 MG capsule  2 times daily     02/28/19 1457    HYDROcodone-acetaminophen (NORCO/VICODIN) 5-325 MG tablet  Every 4 hours PRN     02/28/19 1457           Ivery Quale, PA-C 03/01/19 2110    Samuel Jester, DO 03/04/19 1023

## 2019-02-28 NOTE — ED Triage Notes (Signed)
Fever, sore throat, abscess ion right inner forearm, onset 3 days ago

## 2019-03-01 LAB — NOVEL CORONAVIRUS, NAA (HOSP ORDER, SEND-OUT TO REF LAB; TAT 18-24 HRS): SARS-CoV-2, NAA: NOT DETECTED

## 2019-03-03 LAB — AEROBIC CULTURE W GRAM STAIN (SUPERFICIAL SPECIMEN)

## 2019-03-03 LAB — URINE CULTURE
Culture: >=100000 — AB
Special Requests: NORMAL

## 2019-03-03 LAB — AEROBIC CULTURE? (SUPERFICIAL SPECIMEN)

## 2019-03-04 ENCOUNTER — Telehealth: Payer: Self-pay | Admitting: Emergency Medicine

## 2019-03-04 NOTE — Telephone Encounter (Signed)
Post ED Visit - Positive Culture Follow-up  Culture report reviewed by antimicrobial stewardship pharmacist: [facility] []  Enzo Bi, Pharm.D. []  Celedonio Miyamoto, Pharm.D., BCPS AQ-ID []  Garvin Fila, Pharm.D., BCPS []  Georgina Pillion, Pharm.D., BCPS []  [place], [address].D., BCPS, AAHIVP []  Estella Husk, Pharm.D., BCPS, AAHIVP [x]  Lysle Pearl, PharmD, BCPS []  Phillips Climes, PharmD, BCPS []  Agapito Games, PharmD, BCPS []  Verlan Friends, PharmD []  Mervyn Gay, PharmD, BCPS []  Vinnie Level, PharmD  [facility] []  Len Childs, PharmD []  Greer Pickerel, PharmD []  Adalberto Cole, PharmD []  Perlie Gold, Rph []  Lonell Face) Jean Rosenthal, PharmD []  Earl Many, PharmD []  Junita Push, PharmD []  Dorna Leitz, PharmD []  Terrilee Files, PharmD []  Lynann Beaver, PharmD []  Keturah Barre, PharmD []  Loralee Pacas, PharmD []  Bernadene Person, PharmD   Positive wound and urine culture Treated with doxycycline and nitrofurantoin, organism sensitive to the same and no further patient follow-up is required at this time.  Berle Mull 03/04/2019, 11:22 AM

## 2019-03-08 ENCOUNTER — Encounter (HOSPITAL_COMMUNITY): Payer: Self-pay | Admitting: Emergency Medicine

## 2019-03-08 ENCOUNTER — Other Ambulatory Visit: Payer: Self-pay

## 2019-03-08 ENCOUNTER — Emergency Department (HOSPITAL_COMMUNITY): Payer: Medicaid Other

## 2019-03-08 ENCOUNTER — Inpatient Hospital Stay (HOSPITAL_COMMUNITY)
Admission: EM | Admit: 2019-03-08 | Discharge: 2019-03-10 | DRG: 872 | Disposition: A | Discharge status: Home / Self Care | Payer: Medicaid Other | Attending: Family Medicine | Admitting: Family Medicine

## 2019-03-08 DIAGNOSIS — Z880 Allergy status to penicillin: Secondary | ICD-10-CM

## 2019-03-08 DIAGNOSIS — R197 Diarrhea, unspecified: Secondary | ICD-10-CM | POA: Diagnosis present

## 2019-03-08 DIAGNOSIS — R011 Cardiac murmur, unspecified: Secondary | ICD-10-CM

## 2019-03-08 DIAGNOSIS — F17210 Nicotine dependence, cigarettes, uncomplicated: Secondary | ICD-10-CM | POA: Diagnosis present

## 2019-03-08 DIAGNOSIS — Z885 Allergy status to narcotic agent status: Secondary | ICD-10-CM | POA: Diagnosis not present

## 2019-03-08 DIAGNOSIS — Z1159 Encounter for screening for other viral diseases: Secondary | ICD-10-CM | POA: Diagnosis not present

## 2019-03-08 DIAGNOSIS — Z79899 Other long term (current) drug therapy: Secondary | ICD-10-CM | POA: Diagnosis not present

## 2019-03-08 DIAGNOSIS — K219 Gastro-esophageal reflux disease without esophagitis: Secondary | ICD-10-CM | POA: Diagnosis present

## 2019-03-08 DIAGNOSIS — Z833 Family history of diabetes mellitus: Secondary | ICD-10-CM

## 2019-03-08 DIAGNOSIS — K5900 Constipation, unspecified: Secondary | ICD-10-CM | POA: Diagnosis present

## 2019-03-08 DIAGNOSIS — L02413 Cutaneous abscess of right upper limb: Secondary | ICD-10-CM | POA: Diagnosis present

## 2019-03-08 DIAGNOSIS — F419 Anxiety disorder, unspecified: Secondary | ICD-10-CM | POA: Diagnosis present

## 2019-03-08 DIAGNOSIS — E1065 Type 1 diabetes mellitus with hyperglycemia: Secondary | ICD-10-CM | POA: Diagnosis present

## 2019-03-08 DIAGNOSIS — F1910 Other psychoactive substance abuse, uncomplicated: Secondary | ICD-10-CM | POA: Diagnosis not present

## 2019-03-08 DIAGNOSIS — Z8249 Family history of ischemic heart disease and other diseases of the circulatory system: Secondary | ICD-10-CM

## 2019-03-08 DIAGNOSIS — Z79891 Long term (current) use of opiate analgesic: Secondary | ICD-10-CM | POA: Diagnosis not present

## 2019-03-08 DIAGNOSIS — D899 Disorder involving the immune mechanism, unspecified: Secondary | ICD-10-CM | POA: Diagnosis present

## 2019-03-08 DIAGNOSIS — E876 Hypokalemia: Secondary | ICD-10-CM | POA: Diagnosis present

## 2019-03-08 DIAGNOSIS — Z801 Family history of malignant neoplasm of trachea, bronchus and lung: Secondary | ICD-10-CM

## 2019-03-08 DIAGNOSIS — K121 Other forms of stomatitis: Secondary | ICD-10-CM | POA: Diagnosis present

## 2019-03-08 DIAGNOSIS — Z794 Long term (current) use of insulin: Secondary | ICD-10-CM

## 2019-03-08 DIAGNOSIS — B370 Candidal stomatitis: Secondary | ICD-10-CM | POA: Diagnosis present

## 2019-03-08 DIAGNOSIS — E1069 Type 1 diabetes mellitus with other specified complication: Secondary | ICD-10-CM | POA: Diagnosis not present

## 2019-03-08 DIAGNOSIS — F1290 Cannabis use, unspecified, uncomplicated: Secondary | ICD-10-CM | POA: Diagnosis present

## 2019-03-08 DIAGNOSIS — N12 Tubulo-interstitial nephritis, not specified as acute or chronic: Secondary | ICD-10-CM | POA: Diagnosis present

## 2019-03-08 DIAGNOSIS — M7989 Other specified soft tissue disorders: Secondary | ICD-10-CM | POA: Diagnosis present

## 2019-03-08 DIAGNOSIS — L0291 Cutaneous abscess, unspecified: Secondary | ICD-10-CM | POA: Diagnosis not present

## 2019-03-08 DIAGNOSIS — A419 Sepsis, unspecified organism: Secondary | ICD-10-CM | POA: Diagnosis not present

## 2019-03-08 LAB — CBC
HCT: 31.9 % — ABNORMAL LOW (ref 36.0–46.0)
Hemoglobin: 10.6 g/dL — ABNORMAL LOW (ref 12.0–15.0)
MCH: 28.8 pg (ref 26.0–34.0)
MCHC: 33.2 g/dL (ref 30.0–36.0)
MCV: 86.7 fL (ref 80.0–100.0)
Platelets: 354 10*3/uL (ref 150–400)
RBC: 3.68 MIL/uL — ABNORMAL LOW (ref 3.87–5.11)
RDW: 14.6 % (ref 11.5–15.5)
WBC: 18.1 10*3/uL — ABNORMAL HIGH (ref 4.0–10.5)
nRBC: 0 % (ref 0.0–0.2)

## 2019-03-08 LAB — COMPREHENSIVE METABOLIC PANEL
ALT: 31 U/L (ref 0–44)
AST: 12 U/L — ABNORMAL LOW (ref 15–41)
Albumin: 1.8 g/dL — ABNORMAL LOW (ref 3.5–5.0)
Alkaline Phosphatase: 145 U/L — ABNORMAL HIGH (ref 38–126)
Anion gap: 10 (ref 5–15)
BUN: 6 mg/dL (ref 6–20)
CO2: 30 mmol/L (ref 22–32)
Calcium: 8.2 mg/dL — ABNORMAL LOW (ref 8.9–10.3)
Chloride: 98 mmol/L (ref 98–111)
Creatinine, Ser: 0.77 mg/dL (ref 0.44–1.00)
GFR calc Af Amer: >60 mL/min (ref >60)
GFR calc non Af Amer: >60 mL/min (ref >60)
Glucose, Bld: 333 mg/dL — ABNORMAL HIGH (ref 70–99)
Potassium: 3.4 mmol/L — ABNORMAL LOW (ref 3.5–5.1)
Sodium: 138 mmol/L (ref 135–145)
Total Bilirubin: 0.5 mg/dL (ref 0.3–1.2)
Total Protein: 6.1 g/dL — ABNORMAL LOW (ref 6.5–8.1)

## 2019-03-08 LAB — URINALYSIS, ROUTINE W REFLEX MICROSCOPIC
Bilirubin Urine: NEGATIVE
Glucose, UA: >=500 mg/dL — AB
Ketones, ur: NEGATIVE mg/dL
Nitrite: NEGATIVE
Protein, ur: NEGATIVE mg/dL
Specific Gravity, Urine: 1.002 — ABNORMAL LOW (ref 1.005–1.030)
pH: 7 (ref 5.0–8.0)

## 2019-03-08 LAB — RAPID URINE DRUG SCREEN, HOSP PERFORMED
Amphetamines: NOT DETECTED
Barbiturates: NOT DETECTED
Benzodiazepines: NOT DETECTED
Cocaine: POSITIVE — AB
Opiates: NOT DETECTED
Tetrahydrocannabinol: NOT DETECTED

## 2019-03-08 LAB — I-STAT BETA HCG BLOOD, ED (MC, WL, AP ONLY): I-stat hCG, quantitative: <5 m[IU]/mL (ref <5)

## 2019-03-08 LAB — SARS CORONAVIRUS 2 BY RT PCR (HOSPITAL ORDER, PERFORMED IN ~~LOC~~ HOSPITAL LAB): SARS Coronavirus 2: NEGATIVE

## 2019-03-08 LAB — LIPASE, BLOOD: Lipase: 17 U/L (ref 11–51)

## 2019-03-08 LAB — GLUCOSE, CAPILLARY: Glucose-Capillary: 315 mg/dL — ABNORMAL HIGH (ref 70–99)

## 2019-03-08 LAB — BRAIN NATRIURETIC PEPTIDE: B Natriuretic Peptide: 352.8 pg/mL — ABNORMAL HIGH (ref 0.0–100.0)

## 2019-03-08 MED ORDER — INSULIN GLARGINE 100 UNIT/ML ~~LOC~~ SOLN
15.0000 [IU] | Freq: Every day | SUBCUTANEOUS | Status: DC
  Administered 2019-03-09: 15 [IU] via SUBCUTANEOUS
  Filled 2019-03-08 (×2): qty 0.15

## 2019-03-08 MED ORDER — GABAPENTIN 100 MG PO CAPS
200.0000 mg | ORAL_CAPSULE | Freq: Every day | ORAL | Status: DC
  Administered 2019-03-09 (×2): 200 mg via ORAL
  Filled 2019-03-08 (×3): qty 2

## 2019-03-08 MED ORDER — ENOXAPARIN SODIUM 40 MG/0.4ML ~~LOC~~ SOLN
40.0000 mg | SUBCUTANEOUS | Status: DC
  Administered 2019-03-09 (×2): 40 mg via SUBCUTANEOUS
  Filled 2019-03-08 (×2): qty 0.4

## 2019-03-08 MED ORDER — LACTATED RINGERS IV BOLUS
1000.0000 mL | Freq: Once | INTRAVENOUS | Status: AC
  Administered 2019-03-08: 1000 mL via INTRAVENOUS

## 2019-03-08 MED ORDER — INSULIN ASPART 100 UNIT/ML ~~LOC~~ SOLN
0.0000 [IU] | Freq: Every day | SUBCUTANEOUS | Status: DC
  Administered 2019-03-09: 4 [IU] via SUBCUTANEOUS
  Administered 2019-03-09: 5 [IU] via SUBCUTANEOUS

## 2019-03-08 MED ORDER — NYSTATIN 100000 UNIT/ML MT SUSP
5.0000 mL | Freq: Four times a day (QID) | OROMUCOSAL | Status: DC
  Administered 2019-03-09 – 2019-03-10 (×6): 500000 [IU] via OROMUCOSAL
  Filled 2019-03-08 (×6): qty 5

## 2019-03-08 MED ORDER — PANTOPRAZOLE SODIUM 40 MG PO TBEC
40.0000 mg | DELAYED_RELEASE_TABLET | Freq: Every day | ORAL | Status: DC
  Administered 2019-03-09 – 2019-03-10 (×3): 40 mg via ORAL
  Filled 2019-03-08 (×3): qty 1

## 2019-03-08 MED ORDER — RAMELTEON 8 MG PO TABS
8.0000 mg | ORAL_TABLET | Freq: Every day | ORAL | Status: DC
  Administered 2019-03-09 (×2): 8 mg via ORAL
  Filled 2019-03-08 (×3): qty 1

## 2019-03-08 MED ORDER — VANCOMYCIN HCL IN DEXTROSE 1-5 GM/200ML-% IV SOLN
1000.0000 mg | INTRAVENOUS | Status: DC
  Administered 2019-03-09: 1000 mg via INTRAVENOUS
  Filled 2019-03-08 (×2): qty 200

## 2019-03-08 MED ORDER — INSULIN ASPART 100 UNIT/ML ~~LOC~~ SOLN
0.0000 [IU] | Freq: Three times a day (TID) | SUBCUTANEOUS | Status: DC
  Administered 2019-03-09: 3 [IU] via SUBCUTANEOUS
  Administered 2019-03-09: 9 [IU] via SUBCUTANEOUS
  Administered 2019-03-10: 3 [IU] via SUBCUTANEOUS
  Administered 2019-03-10: 9 [IU] via SUBCUTANEOUS

## 2019-03-08 MED ORDER — SODIUM CHLORIDE 0.9% FLUSH
3.0000 mL | Freq: Two times a day (BID) | INTRAVENOUS | Status: DC
  Administered 2019-03-09 – 2019-03-10 (×4): 3 mL via INTRAVENOUS

## 2019-03-08 MED ORDER — ACETAMINOPHEN 325 MG PO TABS
650.0000 mg | ORAL_TABLET | Freq: Four times a day (QID) | ORAL | Status: DC | PRN
  Administered 2019-03-09 – 2019-03-10 (×3): 650 mg via ORAL
  Filled 2019-03-08 (×3): qty 2

## 2019-03-08 MED ORDER — ONDANSETRON HCL 4 MG PO TABS: 4.0000 mg | ORAL_TABLET | Freq: Four times a day (QID) | ORAL | Status: DC | PRN

## 2019-03-08 MED ORDER — SODIUM CHLORIDE 0.9 % IV SOLN
1.0000 g | Freq: Once | INTRAVENOUS | Status: AC
  Administered 2019-03-08: 1 g via INTRAVENOUS
  Filled 2019-03-08: qty 10

## 2019-03-08 MED ORDER — ACETAMINOPHEN 650 MG RE SUPP: 650.0000 mg | Freq: Four times a day (QID) | RECTAL | Status: DC | PRN

## 2019-03-08 MED ORDER — SODIUM CHLORIDE 0.9 % IV SOLN
1.0000 g | INTRAVENOUS | Status: AC
  Administered 2019-03-09: 1 g via INTRAVENOUS
  Filled 2019-03-08: qty 10

## 2019-03-08 MED ORDER — PHENOL 1.4 % MT LIQD
1.0000 | OROMUCOSAL | Status: DC | PRN
  Administered 2019-03-09: 1 via OROMUCOSAL
  Filled 2019-03-08: qty 177

## 2019-03-08 MED ORDER — SODIUM CHLORIDE 0.9 % IV SOLN
2.0000 g | INTRAVENOUS | Status: DC
  Administered 2019-03-09: 2 g via INTRAVENOUS
  Filled 2019-03-08: qty 20

## 2019-03-08 MED ORDER — ONDANSETRON HCL 4 MG/2ML IJ SOLN: 4.0000 mg | Freq: Four times a day (QID) | INTRAMUSCULAR | Status: DC | PRN

## 2019-03-08 MED ORDER — POTASSIUM CHLORIDE CRYS ER 20 MEQ PO TBCR
40.0000 meq | EXTENDED_RELEASE_TABLET | Freq: Once | ORAL | Status: AC
  Administered 2019-03-09: 40 meq via ORAL
  Filled 2019-03-08: qty 2

## 2019-03-08 MED ORDER — SODIUM CHLORIDE 0.9% FLUSH: 3.0000 mL | INTRAVENOUS | Status: DC | PRN

## 2019-03-08 MED ORDER — VANCOMYCIN HCL IN DEXTROSE 1-5 GM/200ML-% IV SOLN
1000.0000 mg | Freq: Once | INTRAVENOUS | Status: AC
  Administered 2019-03-08: 1000 mg via INTRAVENOUS
  Filled 2019-03-08: qty 200

## 2019-03-08 MED ORDER — SODIUM CHLORIDE 0.9 % IV SOLN: 250.0000 mL | INTRAVENOUS | Status: DC | PRN

## 2019-03-08 NOTE — ED Provider Notes (Signed)
[facility] EMERGENCY DEPARTMENT Provider Note   CSN: [id number] Arrival date & time: 03/08/19  1404    History   Chief Complaint Chief Complaint  Patient presents with  . Leg Swelling  . Diarrhea    HPI [name] is a 33 y.o. female.   HPI 33 year old female with a history of type 1 diabetes and polysubstance abuse presents with swelling.  Patient states that she recently had an abscess drained from her right forearm and was started on antibiotics.  She started the antibiotics 3 days ago and since that time has developed swelling in her ankles, abdomen, and face.  She also has pain with swallowing secondary to white lesions on her tongue consistent with thrush.  She has had continued to have fevers T-max 101 last night.  No nausea, vomiting.  She does endorse nonbloody diarrhea.  Past Medical History:  Diagnosis Date  . Diabetes mellitus   . DKA 05/01/2011  . Polysubstance abuse [facility])     Patient Active Problem List   Diagnosis Date Noted  . Pyelonephritis 03/08/2019  . Abscess   . Systolic murmur   . Hyperglycemia due to type 1 diabetes mellitus (HCC) 10/28/2018  . Substance abuse (HCC) 09/15/2018  . Tobacco abuse 09/15/2018  . Cough 09/15/2018  . Protein-calorie malnutrition, severe (HCC) 09/15/2018  . GERD (gastroesophageal reflux disease) 09/15/2018  . Nausea & vomiting   . Acute lower UTI 07/14/2016  . Panic disorder without agoraphobia with moderate panic attacks 01/20/2016  . Benzodiazepine abuse (HCC) 01/20/2016  . Substance induced mood disorder (HCC) 01/19/2016  . DKA 05/01/2011  . Type 1 diabetes mellitus with other specified complication (HCC) 05/01/2011  . Leukocytosis 05/01/2011    Past Surgical History:  Procedure Laterality Date  . TUBAL LIGATION    . WOUND EXPLORATION Left 07/26/2014   Procedure: Irrigation, debridement and exploration of elbow wounds;  Surgeon: Betha Loa, MD;  Location: [facility] OR;  Service: Orthopedics;   Laterality: Left;     OB History    Gravida  2   Para  2   Term      Preterm      AB      Living        SAB      TAB      Ectopic      Multiple      Live Births               Home Medications    Prior to Admission medications   Medication Sig Start Date End Date Taking? Authorizing Provider  acetaminophen (TYLENOL) 500 MG tablet Take 1,500 mg by mouth every 6 (six) hours as needed for mild pain, moderate pain or fever.     [provider]  doxycycline (VIBRAMYCIN) 100 MG capsule Take 1 capsule (100 mg total) by mouth 2 (two) times daily. 02/28/19   Ivery Quale, PA-C  gabapentin (NEURONTIN) 100 MG capsule Take 200 mg by mouth at bedtime. 08/24/18   [provider]  HYDROcodone-acetaminophen (NORCO/VICODIN) 5-325 MG tablet Take 1 tablet by mouth every 4 (four) hours as needed. 02/28/19   Ivery Quale, PA-C  insulin aspart (NOVOLOG FLEXPEN) 100 UNIT/ML FlexPen Inject 1-7 Units into the skin 3 (three) times daily with meals. Dose based on CBG and carb count    [provider]  Insulin Glargine (LANTUS SOLOSTAR) 100 UNIT/ML Solostar Pen Inject 35 Units into the skin daily at 10 pm. 07/15/16   Lonia Blood,  MD  linaclotide (LINZESS) 290 MCG CAPS capsule Take 290 mcg by mouth daily before breakfast.    [provider]  menthol-cetylpyridinium (CEPACOL) 3 MG lozenge Take 1 lozenge (3 mg total) by mouth as needed for sore throat. Patient not taking: Reported on 02/28/2019 09/16/18   Maurilio LovelyShah, Pratik D, DO  nitrofurantoin, macrocrystal-monohydrate, (MACROBID) 100 MG capsule Take 1 capsule (100 mg total) by mouth 2 (two) times daily. 02/28/19   Ivery QualeBryant, Hobson, PA-C  omeprazole (PRILOSEC) 40 MG capsule Take 40 mg by mouth daily before breakfast.     [provider]  ondansetron (ZOFRAN) 4 MG tablet Take 1 tablet (4 mg total) by mouth every 6 (six) hours. Patient not taking: Reported on 02/28/2019 03/20/18   Hedges, Tinnie GensJeffrey, PA-C   sertraline (ZOLOFT) 25 MG tablet Take 25 mg by mouth daily.    [provider]    Family History Family History  Problem Relation Age of Onset  . Coronary artery disease Mother   . Lung cancer Mother   . Heart attack Father 7550  . Thyroid disease Maternal Grandmother   . Diabetes type I Daughter     Social History Social History   Tobacco Use  . Smoking status: Current Every Day Smoker    Packs/day: 1.00    Years: 15.00    Pack years: 15.00    Types: Cigarettes  . Smokeless tobacco: Never Used  . Tobacco comment: She is currently trying to quit.  Substance Use Topics  . Alcohol use: No  . Drug use: No    Comment: HX of cocaine and marijuana use     Allergies   Codeine; Penicillins; and Tramadol   Review of Systems Review of Systems  Constitutional: Positive for fever. Negative for chills.  HENT: Negative for ear pain and sore throat.   Eyes: Negative for pain and visual disturbance.  Respiratory: Negative for cough and shortness of breath.   Cardiovascular: Negative for chest pain and palpitations.  Gastrointestinal: Positive for diarrhea. Negative for abdominal pain and vomiting.  Genitourinary: Negative for dysuria and hematuria.  Musculoskeletal: Negative for arthralgias and back pain.  Skin: Negative for color change and rash.  Neurological: Negative for seizures and syncope.  All other systems reviewed and are negative.    Physical Exam Updated Vital Signs BP (!) 169/91 (BP Location: Right Arm)   Pulse 72   Temp 98.2 F (36.8 C) (Oral)   Resp 18   Ht 5' (1.524 m)   Wt 45.6 kg   LMP 02/24/2019 (Approximate)   SpO2 94%   BMI 19.63 kg/m   Physical Exam Vitals signs and nursing note reviewed.  Constitutional:      General: She is not in acute distress.    Appearance: She is well-developed. She is ill-appearing.     Comments: Lethargic  HENT:     Head: Normocephalic and atraumatic.     Mouth/Throat:     Comments: Thrush on the tongue  and posterior oropharynx Eyes:     Conjunctiva/sclera: Conjunctivae normal.  Neck:     Musculoskeletal: Neck supple.  Cardiovascular:     Rate and Rhythm: Normal rate and regular rhythm.     Heart sounds: No murmur.  Pulmonary:     Effort: Pulmonary effort is normal. No respiratory distress.     Breath sounds: Normal breath sounds.  Abdominal:     Palpations: Abdomen is soft.     Tenderness: There is no abdominal tenderness.  Musculoskeletal:     Comments:  1+ pitting edema bilateral ankles  Skin:    General: Skin is warm and dry.     Comments: Open wound on the right forearm, no surrounding erythema or induration  Neurological:     General: No focal deficit present.     Mental Status: She is alert and oriented to person, place, and time.      ED Treatments / Results  Labs (all labs ordered are listed, but only abnormal results are displayed) Labs Reviewed  COMPREHENSIVE METABOLIC PANEL - Abnormal; Notable for the following components:      Result Value   Potassium 3.4 (*)    Glucose, Bld 333 (*)    Calcium 8.2 (*)    Total Protein 6.1 (*)    Albumin 1.8 (*)    AST 12 (*)    Alkaline Phosphatase 145 (*)    All other components within normal limits  CBC - Abnormal; Notable for the following components:   WBC 18.1 (*)    RBC 3.68 (*)    Hemoglobin 10.6 (*)    HCT 31.9 (*)    All other components within normal limits  URINALYSIS, ROUTINE W REFLEX MICROSCOPIC - Abnormal; Notable for the following components:   Color, Urine STRAW (*)    Specific Gravity, Urine 1.002 (*)    Glucose, UA >=500 (*)    Hgb urine dipstick SMALL (*)    Leukocytes,Ua SMALL (*)    Bacteria, UA RARE (*)    All other components within normal limits  BRAIN NATRIURETIC PEPTIDE - Abnormal; Notable for the following components:   B Natriuretic Peptide 352.8 (*)    All other components within normal limits  RAPID URINE DRUG SCREEN, HOSP PERFORMED - Abnormal; Notable for the following components:    Cocaine POSITIVE (*)    All other components within normal limits  GLUCOSE, CAPILLARY - Abnormal; Notable for the following components:   Glucose-Capillary 315 (*)    All other components within normal limits  SARS CORONAVIRUS 2 (HOSPITAL ORDER, PERFORMED IN [facility])  CULTURE, BLOOD (ROUTINE X 2)  CULTURE, BLOOD (ROUTINE X 2)  URINE CULTURE  LIPASE, BLOOD  BASIC METABOLIC PANEL  CBC  HEMOGLOBIN A1C  HIV ANTIBODY (ROUTINE TESTING W REFLEX)  HEPATITIS PANEL, ACUTE  I-STAT BETA HCG BLOOD, ED ([facility], [facility], [facility] ONLY)    EKG None  Radiology Dg Chest Portable 1 View  Result Date: 03/08/2019 CLINICAL DATA:  Fever EXAM: PORTABLE CHEST 1 VIEW COMPARISON:  Feb 28, 2019 FINDINGS: There is no evident edema or consolidation. The heart size and pulmonary vascularity are normal. No adenopathy. No bone lesions. IMPRESSION: No edema or consolidation. Electronically Signed   By: Bretta Bang III M.D.   On: 03/08/2019 19:56    Procedures Procedures (including critical care time)  Medications Ordered in ED Medications  pantoprazole (PROTONIX) EC tablet 40 mg (has no administration in time range)  cefTRIAXone (ROCEPHIN) 2 g in sodium chloride 0.9 % 100 mL IVPB (has no administration in time range)  enoxaparin (LOVENOX) injection 40 mg (has no administration in time range)  sodium chloride flush (NS) 0.9 % injection 3 mL (has no administration in time range)  sodium chloride flush (NS) 0.9 % injection 3 mL (has no administration in time range)  0.9 %  sodium chloride infusion (has no administration in time range)  acetaminophen (TYLENOL) tablet 650 mg (has no administration in time range)    Or  acetaminophen (TYLENOL) suppository 650 mg (has no administration in  time range)  ondansetron (ZOFRAN) tablet 4 mg (has no administration in time range)    Or  ondansetron (ZOFRAN) injection 4 mg (has no administration in time range)  insulin glargine (LANTUS) injection 15 Units (has no  administration in time range)  insulin aspart (novoLOG) injection 0-9 Units (has no administration in time range)  insulin aspart (novoLOG) injection 0-5 Units (has no administration in time range)  nystatin (MYCOSTATIN) 100000 UNIT/ML suspension 500,000 Units (has no administration in time range)  phenol (CHLORASEPTIC) mouth spray 1 spray (has no administration in time range)  cefTRIAXone (ROCEPHIN) 1 g in sodium chloride 0.9 % 100 mL IVPB (has no administration in time range)  ramelteon (ROZEREM) tablet 8 mg (has no administration in time range)  gabapentin (NEURONTIN) capsule 200 mg (has no administration in time range)  vancomycin (VANCOCIN) IVPB 1000 mg/200 mL premix (has no administration in time range)  potassium chloride SA (K-DUR) CR tablet 40 mEq (has no administration in time range)  lactated ringers bolus 1,000 mL (1,000 mLs Intravenous New Bag/Given 03/08/19 1948)  cefTRIAXone (ROCEPHIN) 1 g in sodium chloride 0.9 % 100 mL IVPB (0 g Intravenous Stopped 03/08/19 2058)  vancomycin (VANCOCIN) IVPB 1000 mg/200 mL premix (1,000 mg Intravenous New Bag/Given 03/08/19 2059)     Initial Impression / Assessment and Plan / ED Course  I have reviewed the triage vital signs and the nursing notes.  Pertinent labs & imaging results that were available during my care of the patient were reviewed by me and considered in my medical decision making (see chart for details).  33 year old female with a history of type 1 diabetes and polysubstance abuse presents with swelling.  Started on doxycycline for a abscess that was drained from her right forearm on 5/14.  She was also treated with nitrofurantoin for a UTI.  On exam, she has 1+ pitting edema of her ankles.  Patient is also swollen.  Thrush present on the tongue.  Abdomen is soft and nontender.  No rashes present.  No stridor or wheezing.  Doubt anaphylaxis.  Patient's urinary symptoms have improved.  No CVA tenderness.  Doubt pyelonephritis.  CBC  notable for leukocytosis of 18.1 up from 12.3 on the 14th.  Hemoglobin is also 10.6 down from 14.7.  Potassium 3.4, glucose 333, CO2 31 anion gap 10.  Labs not consistent with DKA.  Her albumin is 1.8 which could be the reason for her diffuse swelling.   As patient has had continued fevers despite antibiotics as well as a rising white count, we will admit for IV antibiotics.  Prior wound culture grew MRSA.  Prior urine culture grew E. Coli.   Blood cultures obtained. Vanc and ceftriaxone started.  Final Clinical Impressions(s) / ED Diagnoses   Final diagnoses:  None    ED Discharge Orders    None       Vallery Ridge, MD 03/08/19 2339    Melene Plan, DO 03/09/19 1619

## 2019-03-08 NOTE — ED Triage Notes (Signed)
Pt in with c/o bilateral leg swelling. States she was recently admitted at [facility] for trx of arm abcess. Denies any fevers, sob, but the leg swelling has gotten worse x past few days. Denies any drug injection use

## 2019-03-08 NOTE — Progress Notes (Signed)
Pharmacy Antibiotic Note  [name] is a 33 y.o. female admitted on 03/08/2019 with bilateral leg swelling. Reports hx of abscess on R arm. Hx of polysubstance abuse. Starting empiric antibiotics. May work up for endocarditis. She was recently on doxycycline and nitrofurantoin.  Vancomycin 1000 mg IV Q 24 hrs. Goal AUC 400-550. Expected AUC: 483 SCr used: 0.8.   Plan: -Ceftriaxone 2g IV q24h -Vancomycin 1 g IV q24h -Monitor renal fx, cultures, VT as needed   Height: 5' (152.4 cm) Weight: 100 lb 8 oz (45.6 kg) IBW/kg (Calculated) : 45.5  Temp (24hrs), Avg:99.7 F (37.6 C), Min:98.2 F (36.8 C), Max:101.7 F (38.7 C)  Recent Labs  Lab 03/08/19 1541  WBC 18.1*  CREATININE 0.77    Antimicrobials this admission: 5/22 vancomycin > 5/22 ceftriaxone >  Dose adjustments this admission: N/A  Microbiology results: 5/22 blood cx: 5/22 urine cx: 5/22 Covid-19:  Baldemar Friday 03/08/2019 10:21 PM

## 2019-03-08 NOTE — ED Notes (Signed)
ED TO INPATIENT HANDOFF REPORT  ED Nurse Name and Phone #: Marcelina Morel [phone]  S Name/Age/Gender [name] 33 y.o. female Room/Bed: 017C/017C  Code Status   Code Status: Prior  Home/SNF/Other Home Patient oriented to: self, place, time and situation Is this baseline? Yes   Triage Complete: Triage complete  Chief Complaint swelling/diarrhea  Triage Note Pt in with c/o bilateral leg swelling. States she was recently admitted at [facility] for trx of arm abcess. Denies any fevers, sob, but the leg swelling has gotten worse x past few days. Denies any drug injection use   Allergies Allergies  Allergen Reactions  . Codeine Anaphylaxis and Nausea And Vomiting  . Penicillins Anaphylaxis and Nausea And Vomiting    Has patient had a PCN reaction causing immediate rash, facial/tongue/throat swelling, SOB or lightheadedness with hypotension: Yes Has patient had a PCN reaction causing severe rash involving mucus membranes or skin necrosis: Yes Has patient had a PCN reaction that required hospitalization Yes Has patient had a PCN reaction occurring within the last 10 years: No If all of the above answers are "NO", then may proceed with Cephalosporin use.   . Tramadol Itching    Level of Care/Admitting Diagnosis ED Disposition    ED Disposition Condition Comment   Admit  Hospital Area: [facility] [[id number]]  Level of Care: Med-Surg [16]  Covid Evaluation: Screening Protocol (No Symptoms)  Diagnosis: Pyelonephritis [454098]  Admitting Physician: Sandre Kitty [[id number]]  Attending Physician: Westley Chandler [[id number]]  Estimated length of stay: 3 - 4 days  Certification:: I certify this patient will need inpatient services for at least 2 midnights  PT Class (Do Not Modify): Inpatient [101]  PT Acc Code (Do Not Modify): Private [1]       B Medical/Surgery History Past Medical History:  Diagnosis Date  . Diabetes mellitus   . DKA 05/01/2011  . Polysubstance  abuse [facility])    Past Surgical History:  Procedure Laterality Date  . TUBAL LIGATION    . WOUND EXPLORATION Left 07/26/2014   Procedure: Irrigation, debridement and exploration of elbow wounds;  Surgeon: Betha Loa, MD;  Location: [facility] OR;  Service: Orthopedics;  Laterality: Left;     A IV Location/Drains/Wounds Patient Lines/Drains/Airways Status   Active Line/Drains/Airways    Name:   Placement date:   Placement time:   Site:   Days:   Peripheral IV 03/08/19 Right Forearm   03/08/19    1947    Forearm   less than 1          Intake/Output Last 24 hours  Intake/Output Summary (Last 24 hours) at 03/08/2019 2110 Last data filed at 03/08/2019 2058 Gross per 24 hour  Intake 100 ml  Output -  Net 100 ml    Labs/Imaging Results for orders placed or performed during the hospital encounter of 03/08/19 (from the past 48 hour(s))  Lipase, blood     Status: None   Collection Time: 03/08/19  3:41 PM  Result Value Ref Range   Lipase 17 11 - 51 U/L    Comment: Performed at [facility], [address] [place], [place] [address]  Comprehensive metabolic panel     Status: Abnormal   Collection Time: 03/08/19  3:41 PM  Result Value Ref Range   Sodium 138 135 - 145 mmol/L   Potassium 3.4 (L) 3.5 - 5.1 mmol/L   Chloride 98 98 - 111 mmol/L   CO2 30 22 - 32 mmol/L  Glucose, Bld 333 (H) 70 - 99 mg/dL   BUN 6 6 - 20 mg/dL   Creatinine, Ser 2.44 0.44 - 1.00 mg/dL   Calcium 8.2 (L) 8.9 - 10.3 mg/dL   Total Protein 6.1 (L) 6.5 - 8.1 g/dL   Albumin 1.8 (L) 3.5 - 5.0 g/dL   AST 12 (L) 15 - 41 U/L   ALT 31 0 - 44 U/L   Alkaline Phosphatase 145 (H) 38 - 126 U/L   Total Bilirubin 0.5 0.3 - 1.2 mg/dL   GFR calc non Af Amer >60 >60 mL/min   GFR calc Af Amer >60 >60 mL/min   Anion gap 10 5 - 15    Comment: Performed at [facility], [address], [place], [place] [address]  CBC     Status: Abnormal   Collection Time: 03/08/19  3:41 PM  Result Value Ref Range   WBC 18.1 (H) 4.0 -  10.5 K/uL   RBC 3.68 (L) 3.87 - 5.11 MIL/uL   Hemoglobin 10.6 (L) 12.0 - 15.0 g/dL   HCT 81.7 (L) 71.1 - 65.7 %   MCV 86.7 80.0 - 100.0 fL   MCH 28.8 26.0 - 34.0 pg   MCHC 33.2 30.0 - 36.0 g/dL   RDW 90.3 83.3 - 38.3 %   Platelets 354 150 - 400 K/uL   nRBC 0.0 0.0 - 0.2 %    Comment: Performed at [facility], [address], [place], [place] [address]  I-Stat beta hCG blood, ED     Status: None   Collection Time: 03/08/19  3:55 PM  Result Value Ref Range   I-stat hCG, quantitative <5.0 <5 mIU/mL   Comment 3            Comment:   GEST. AGE      CONC.  (mIU/mL)   <=1 WEEK        5 - 50     2 WEEKS       50 - 500     3 WEEKS       100 - 10,000     4 WEEKS     1,000 - 30,000        FEMALE AND NON-PREGNANT FEMALE:     LESS THAN 5 mIU/mL   Brain natriuretic peptide     Status: Abnormal   Collection Time: 03/08/19  6:47 PM  Result Value Ref Range   B Natriuretic Peptide 352.8 (H) 0.0 - 100.0 pg/mL    Comment: Performed at [facility], [address], [place], [place] [address]  Urinalysis, Routine w reflex microscopic     Status: Abnormal   Collection Time: 03/08/19  8:00 PM  Result Value Ref Range   Color, Urine STRAW (A) YELLOW   APPearance CLEAR CLEAR   Specific Gravity, Urine 1.002 (L) 1.005 - 1.030   pH 7.0 5.0 - 8.0   Glucose, UA >=500 (A) NEGATIVE mg/dL   Hgb urine dipstick SMALL (A) NEGATIVE   Bilirubin Urine NEGATIVE NEGATIVE   Ketones, ur NEGATIVE NEGATIVE mg/dL   Protein, ur NEGATIVE NEGATIVE mg/dL   Nitrite NEGATIVE NEGATIVE   Leukocytes,Ua SMALL (A) NEGATIVE   RBC / HPF 6-10 0 - 5 RBC/hpf   WBC, UA 11-20 0 - 5 WBC/hpf   Bacteria, UA RARE (A) NONE SEEN   Squamous Epithelial / LPF 0-5 0 - 5    Comment: Performed at [facility], [address] [place], [place] [address]  Urine rapid drug screen (hosp performed)     Status: Abnormal   Collection Time: 03/08/19  8:00 PM  Result Value Ref Range   Opiates NONE DETECTED NONE DETECTED   Cocaine  POSITIVE (A) NONE DETECTED   Benzodiazepines NONE DETECTED NONE DETECTED   Amphetamines NONE DETECTED NONE DETECTED   Tetrahydrocannabinol NONE DETECTED NONE DETECTED   Barbiturates NONE DETECTED NONE DETECTED    Comment: (NOTE) DRUG SCREEN FOR MEDICAL PURPOSES ONLY.  IF CONFIRMATION IS NEEDED FOR ANY PURPOSE, NOTIFY LAB WITHIN 5 DAYS. LOWEST DETECTABLE LIMITS FOR URINE DRUG SCREEN Drug Class                     Cutoff (ng/mL) Amphetamine and metabolites    1000 Barbiturate and metabolites    200 Benzodiazepine                 200 Tricyclics and metabolites     300 Opiates and metabolites        300 Cocaine and metabolites        300 THC                            50 Performed at [facility], [address], [place], [place] [address]    Dg Chest Portable 1 View  Result Date: 03/08/2019 CLINICAL DATA:  Fever EXAM: PORTABLE CHEST 1 VIEW COMPARISON:  Feb 28, 2019 FINDINGS: There is no evident edema or consolidation. The heart size and pulmonary vascularity are normal. No adenopathy. No bone lesions. IMPRESSION: No edema or consolidation. Electronically Signed   By: Bretta Bang III M.D.   On: 03/08/2019 19:56    Pending Labs Unresulted Labs (From admission, onward)    Start     Ordered   03/08/19 1825  Urine culture  ONCE - STAT,   STAT     03/08/19 1824   03/08/19 1824  Blood culture (routine x 2)  BLOOD CULTURE X 2,   STAT     03/08/19 1823   03/08/19 1824  SARS Coronavirus 2 (CEPHEID - Performed in [facility] hospital lab), Hosp Order  (Asymptomatic Patients Labs)  Once,   R    Question:  Rule Out  Answer:  Yes   03/08/19 1823          Vitals/Pain Today's Vitals   03/08/19 1429 03/08/19 1430 03/08/19 1915 03/08/19 1915  BP: (!) 162/82   130/78  Pulse: 90   87  Resp: 20   12  Temp: 99.2 F (37.3 C)   (!) 101.7 F (38.7 C)  TempSrc: Oral     SpO2: 99%   92%  Weight:  47.6 kg    PainSc:   7  7     Isolation Precautions No active  isolations  Medications Medications  vancomycin (VANCOCIN) IVPB 1000 mg/200 mL premix (1,000 mg Intravenous New Bag/Given 03/08/19 2059)  lactated ringers bolus 1,000 mL (1,000 mLs Intravenous New Bag/Given 03/08/19 1948)  cefTRIAXone (ROCEPHIN) 1 g in sodium chloride 0.9 % 100 mL IVPB (0 g Intravenous Stopped 03/08/19 2058)    Mobility walks Low fall risk   Focused Assessments Pain   R Recommendations: See Admitting Provider Note  Report given to:   Additional Notes:

## 2019-03-08 NOTE — H&P (Addendum)
Family Medicine Teaching [facility] Admission History and Physical Service Pager: [phone]  Patient name: [name] Medical record number: [id number] Date of birth: 25-Jun-1986 Age: 33 y.o. Gender: female  Primary Care Provider: [facility], [place] [facility] Consultants: none Code Status: full  Chief Complaint: fever, dysuria  Assessment and Plan: [name] is a 33 y.o. female presenting with approximately 1 week of fever, dysuria and skin abscess that has failed outpatient Abx, and found to have pyelonephritis.  [name] Kitchen PMH is significant for DM-I, substance abuse, anxiety.   Pyelonephritis - patient has had approximately ten days of fever, n/v/d/abdominal pain, and dysuria.  On 5/14 a urine cx was positive for > 100k E.coli.  The patient was started on doxycycline and macrobid for this and a concurrent skin abscess which grew MRSA.  She has been taking the Abx for approximately 3 days but has not improved during this time.  Still having same symptoms.  On exam today she has some LLE and suprapubic TTP as well as exquisite CVA tenderness on the right, with milder tenderness on the left.  UA has small LE, negative nitrites, 11-20 WBC, and rare bacteria although she has been on Abx for 3 days.  Tmax in ED was 101.7. Also has leukocytosis WBC 18.1 meeting 2/4 SIRS criteria. VS stable.  Given the CVA tenderness, systemic symptoms, and failure of o/p Abx for UTI, patient will be admitted for IV abx for treatment of pyelonephritis.  Will give CTX and vancomycin and monitor for improvement.   - admit to inpatient, med-surg, Dr. Manson Passey attending.  - IV CTX 2g qd (5/21-) and vanc per pharm (5/21 - ) - f/u Urine and blood Cx - AM labs: cbc, bmp, a1c - daily weights - I/O per shift - vitals per routine. Pulse ox w/ vitals - tylenol PRN  Skin Abscess - located on right wrist anteriorly. I&D was performed last Thursday and pt was put on macrobid and doxycycline for that and UTI.  Wound currently  has some mild erythema surrounding it, but does not look obviously infected. MRSA seen on wound culture.   -vancomycin per pharm consult - monitor for infection - HIV test given hx of drug abuse.   Thrush - thrush visible on anterior tongue.  See picture below.  Able to be scraped off. Developed after starting abx, which patient states has happened to her once before when taking abx. Appears to have multiple painful ulcers on her tongue as well. No hx of HSV. Also complaining of sore throat.  If there is concern for esophageal candidiasis then we can order EGD if needed. Thrush likely 2/2 immunocompromised status from longstanding poorly controlled DM-I but need to r/o HIV as well.  - nystatin swish and swallow - chloraseptic throat spray PRN - HIV test  Diabetes Mellitus- I - pt states she has had DM for 20 years.  She takes 35 U lantus at home with novolog at meals.  All documented A1c's on epic dating back to 2012 have been > 10.9.  Uncertain how compliant patient is with her medications, though she states she checks her BG daily and they have been > 300.  -  Start lantus 15U qhs, increase if needed.   - sSSI w/ meals and qhs - f/u A1c.   Systolic Murmur - systolic murmur heard most loudly over the left upper sternal border.  Given the possibility of IVDA (though patient denies both current and past IV drug use), skin abscess of the right arm,  and fever that has failed o/p abx, she could have endocarditis.  She also has a 20 yr hx of type I DM that is currently poorly controlled, which could be causing heart failure.  This could also explain her b/l leg swelling. BNP is 352, no previous BNP on file.  Giving 2g instead of 1g IV CTX until endocarditis ruled out.  - TTE  - consider TEE if still suspecting endocarditis.   - AM EKG  Substance use disorder - positive for cocaine on UDS.  Admits to marijuana use.  Smokes 0.5-1 ppd.  Denies alcohol use.   - consult to social work.  - acute hep  panel  Hypokalemia. K 3.4 - repleted - monitor on BMP  FEN/GI: carb modified, pantoprazole Prophylaxis: lovenox  Disposition: med-surg  History of Present Illness:  [name] is a 33 y.o. female presenting with  Approximately one week of fever, n/v/d, and dysuria.    The patient started developing symptoms of fever, nausea, vomiting, diarrhea, and dysuria approximately 10 days ago.  She also developed an abscess on her right wrist that started as a 'pimple' at this time.  Two days after she developed symptoms she went to the ED and had an I&D performed and was given doxycycline and macrobid.  She could not get these abx initially so she has only been taking them for about three days.  Her symptoms have remained during this time. She is also complaining of a 'pain in her kidneys'.  She has been alternating taking tylenol and Ibuprofen for her fever.  She denies vaginal discharge or foul urine odor.  She reports no sick contacts recently. She also reports developing thrush after starting abx.  She says this has happened before.      The patient is a type I diabetic who takes lantus and novolog at home. She reports being compliant with her medication.    The patient reports having constipation at baseline, for which she takes linzess.  She is currently having diarrhea.   Review Of Systems: Per HPI with the following additions:   Review of Systems  Constitutional: Positive for fever. Negative for chills.  HENT: Positive for sore throat.        Thrush  Eyes: Positive for blurred vision.  Respiratory: Negative for cough and shortness of breath.   Cardiovascular: Positive for leg swelling. Negative for chest pain.  Gastrointestinal: Positive for abdominal pain, diarrhea, nausea and vomiting.  Genitourinary: Positive for dysuria.  Musculoskeletal: Negative for joint pain and myalgias.  Skin: Negative for itching.  Neurological: Negative for dizziness.  Psychiatric/Behavioral:  Negative for substance abuse.    Patient Active Problem List   Diagnosis Date Noted  . Hyperglycemia due to type 1 diabetes mellitus (HCC) 10/28/2018  . Polysubstance abuse (HCC) 09/15/2018  . Tobacco abuse 09/15/2018  . Cough 09/15/2018  . Protein-calorie malnutrition, severe (HCC) 09/15/2018  . GERD (gastroesophageal reflux disease) 09/15/2018  . Nausea & vomiting   . Acute lower UTI 07/14/2016  . Panic disorder without agoraphobia with moderate panic attacks 01/20/2016  . Benzodiazepine abuse (HCC) 01/20/2016  . Substance induced mood disorder (HCC) 01/19/2016  . DKA 05/01/2011  . Diabetes mellitus type 1, uncontrolled (HCC) 05/01/2011  . Leukocytosis 05/01/2011    Past Medical History: Past Medical History:  Diagnosis Date  . Diabetes mellitus   . DKA 05/01/2011  . Polysubstance abuse [facility](HCC)     Past Surgical History: Past Surgical History:  Procedure Laterality Date  . TUBAL LIGATION    .  WOUND EXPLORATION Left 07/26/2014   Procedure: Irrigation, debridement and exploration of elbow wounds;  Surgeon: Betha Loa, MD;  Location: [facility] OR;  Service: Orthopedics;  Laterality: Left;    Social History: Social History   Tobacco Use  . Smoking status: Current Every Day Smoker    Packs/day: 1.00    Years: 15.00    Pack years: 15.00    Types: Cigarettes  . Smokeless tobacco: Never Used  . Tobacco comment: She is currently trying to quit.  Substance Use Topics  . Alcohol use: No  . Drug use: No    Comment: HX of cocaine and marijuana use   Additional social history: lives with husband and two children. She has had her tubes tied and reports being in a monogamous relationship. She says she smokes 0.5 ppd, does not drink alcohol, and does not do other drugs other than marijuana occasionally.     Please also refer to relevant sections of EMR.  Family History: Family History  Problem Relation Age of Onset  . Coronary artery disease Mother   . Lung cancer Mother   .  Heart attack Father 77  . Thyroid disease Maternal Grandmother   . Diabetes type I Daughter     Allergies and Medications: Allergies  Allergen Reactions  . Codeine Anaphylaxis and Nausea And Vomiting  . Penicillins Anaphylaxis and Nausea And Vomiting    Has patient had a PCN reaction causing immediate rash, facial/tongue/throat swelling, SOB or lightheadedness with hypotension: Yes Has patient had a PCN reaction causing severe rash involving mucus membranes or skin necrosis: Yes Has patient had a PCN reaction that required hospitalization Yes Has patient had a PCN reaction occurring within the last 10 years: No If all of the above answers are "NO", then may proceed with Cephalosporin use.   . Tramadol Itching   No current facility-administered medications on file prior to encounter.    Current Outpatient Medications on File Prior to Encounter  Medication Sig Dispense Refill  . acetaminophen (TYLENOL) 500 MG tablet Take 1,500 mg by mouth every 6 (six) hours as needed for mild pain, moderate pain or fever.     . doxycycline (VIBRAMYCIN) 100 MG capsule Take 1 capsule (100 mg total) by mouth 2 (two) times daily. 14 capsule 0  . gabapentin (NEURONTIN) 100 MG capsule Take 200 mg by mouth at bedtime.  0  . HYDROcodone-acetaminophen (NORCO/VICODIN) 5-325 MG tablet Take 1 tablet by mouth every 4 (four) hours as needed. 12 tablet 0  . insulin aspart (NOVOLOG FLEXPEN) 100 UNIT/ML FlexPen Inject 1-7 Units into the skin 3 (three) times daily with meals. Dose based on CBG and carb count    . Insulin Glargine (LANTUS SOLOSTAR) 100 UNIT/ML Solostar Pen Inject 35 Units into the skin daily at 10 pm. 15 mL 11  . linaclotide (LINZESS) 290 MCG CAPS capsule Take 290 mcg by mouth daily before breakfast.    . menthol-cetylpyridinium (CEPACOL) 3 MG lozenge Take 1 lozenge (3 mg total) by mouth as needed for sore throat. (Patient not taking: Reported on 02/28/2019) 100 tablet 12  . nitrofurantoin,  macrocrystal-monohydrate, (MACROBID) 100 MG capsule Take 1 capsule (100 mg total) by mouth 2 (two) times daily. 14 capsule 0  . omeprazole (PRILOSEC) 40 MG capsule Take 40 mg by mouth daily before breakfast.     . ondansetron (ZOFRAN) 4 MG tablet Take 1 tablet (4 mg total) by mouth every 6 (six) hours. (Patient not taking: Reported on 02/28/2019) 12 tablet 0  .  sertraline (ZOLOFT) 25 MG tablet Take 25 mg by mouth daily.      Objective: BP 130/78   Pulse 87   Temp (!) 101.7 F (38.7 C)   Resp 12   Wt 47.6 kg   LMP 02/24/2019 (Approximate)   SpO2 92%   BMI 20.49 kg/m  Exam: General: alert and oriented.  Laying on her side in moderate discomfort.   Eyes: PERRL.  EOMI. No scleral icterus.   ENTM: white, scrapable film over tongue along with multple small ulcerations seen.  Could not open up mouth wide enough to visualize the oropharynx.   Neck: no thyromegaly, some mild nontender swelling of the sublingual glands.   Cardiovascular: 2/6 systolic murmur heard in the left upper sternal border.  Regular rhythm. Normal rate.  2+ radial pulse. No JVD. Trace pitting edema to mild shins bilaterally Respiratory: LCTAB.  No crackles or wheezes.   Gastrointestinal: TTP on the LLQ and suprapubic region. Normal bowel sounds. Exquisitely tender to palpation in the right costovertebral angle.  Milder TTP on left CVA.   MSK: 5/5 strength upper and lower extremities b/l.   Derm: approximately 2cm healing incision with surrounding erythema s/p I&D on right forearm, just proximal to old tattoo. See picture below. No needle marks seen in antecubital fossa or on the patient's feet.   Neuro: CN 2-12 grossly intact.  Sensation grossly intact.  Psych: pleasant affect.  Makes eye contact.          Labs and Imaging: CBC BMET  Recent Labs  Lab 03/08/19 1541  WBC 18.1*  HGB 10.6*  HCT 31.9*  PLT 354   Recent Labs  Lab 03/08/19 1541  NA 138  K 3.4*  CL 98  CO2 30  BUN 6  CREATININE 0.77  GLUCOSE  333*  CALCIUM 8.2*     BNP    Component Value Date/Time   BNP 352.8 (H) 03/08/2019 1847   Urine dipstick shows positive for WBC's, positive for RBC's, positive for glucose and positive for leukocytes.  Micro exam: 11-20 WBC's per HPF, 6-10 RBC's per HPF and rare bacteria.  UDS positive for cocaine  CXR: no edema or consolidation  EKG: t wave inversion in V1-V2.  QTc 418.   Sandre Kitty, MD 03/08/2019, 7:32 PM PGY-1, [facility] Family Medicine FPTS Intern pager: [phone], text pages welcome  FPTS Upper-Level Resident Addendum  I have independently interviewed and examined the patient. I have discussed the above with the original author and agree with their documentation. My edits for correction/addition/clarification are in blue. Please see also any attending notes.   Leland Her, DO PGY-3, [facility] 03/09/2019 12:31 AM  FPTS Service pager: [phone] (text pages welcome through [facility])

## 2019-03-09 ENCOUNTER — Other Ambulatory Visit (HOSPITAL_COMMUNITY): Payer: Medicaid Other

## 2019-03-09 ENCOUNTER — Inpatient Hospital Stay (HOSPITAL_COMMUNITY): Payer: Medicaid Other

## 2019-03-09 ENCOUNTER — Other Ambulatory Visit: Payer: Self-pay

## 2019-03-09 DIAGNOSIS — R011 Cardiac murmur, unspecified: Secondary | ICD-10-CM

## 2019-03-09 LAB — BASIC METABOLIC PANEL
Anion gap: 7 (ref 5–15)
BUN: 5 mg/dL — ABNORMAL LOW (ref 6–20)
CO2: 31 mmol/L (ref 22–32)
Calcium: 8.1 mg/dL — ABNORMAL LOW (ref 8.9–10.3)
Chloride: 102 mmol/L (ref 98–111)
Creatinine, Ser: 0.66 mg/dL (ref 0.44–1.00)
GFR calc Af Amer: >60 mL/min (ref >60)
GFR calc non Af Amer: >60 mL/min (ref >60)
Glucose, Bld: 212 mg/dL — ABNORMAL HIGH (ref 70–99)
Potassium: 3.4 mmol/L — ABNORMAL LOW (ref 3.5–5.1)
Sodium: 140 mmol/L (ref 135–145)

## 2019-03-09 LAB — ECHOCARDIOGRAM COMPLETE
Height: 60 in
Weight: 1608 oz

## 2019-03-09 LAB — GLUCOSE, CAPILLARY
Glucose-Capillary: 206 mg/dL — ABNORMAL HIGH (ref 70–99)
Glucose-Capillary: 77 mg/dL (ref 70–99)
Glucose-Capillary: 418 mg/dL — ABNORMAL HIGH (ref 70–99)
Glucose-Capillary: 352 mg/dL — ABNORMAL HIGH (ref 70–99)

## 2019-03-09 LAB — CBC
HCT: 28.7 % — ABNORMAL LOW (ref 36.0–46.0)
Hemoglobin: 9.7 g/dL — ABNORMAL LOW (ref 12.0–15.0)
MCH: 29 pg (ref 26.0–34.0)
MCHC: 33.8 g/dL (ref 30.0–36.0)
MCV: 85.9 fL (ref 80.0–100.0)
Platelets: 328 10*3/uL (ref 150–400)
RBC: 3.34 MIL/uL — ABNORMAL LOW (ref 3.87–5.11)
RDW: 14.5 % (ref 11.5–15.5)
WBC: 12 10*3/uL — ABNORMAL HIGH (ref 4.0–10.5)
nRBC: 0 % (ref 0.0–0.2)

## 2019-03-09 LAB — HEMOGLOBIN A1C
Hgb A1c MFr Bld: 11.2 % — ABNORMAL HIGH (ref 4.8–5.6)
Mean Plasma Glucose: 274.74 mg/dL

## 2019-03-09 LAB — MAGNESIUM: Magnesium: 1.7 mg/dL (ref 1.7–2.4)

## 2019-03-09 LAB — HIV ANTIBODY (ROUTINE TESTING W REFLEX): HIV Screen 4th Generation wRfx: NONREACTIVE

## 2019-03-09 MED ORDER — INSULIN GLARGINE 100 UNIT/ML ~~LOC~~ SOLN
5.0000 [IU] | Freq: Every day | SUBCUTANEOUS | Status: DC
  Administered 2019-03-09: 5 [IU] via SUBCUTANEOUS
  Filled 2019-03-09 (×2): qty 0.05

## 2019-03-09 MED ORDER — MAGIC MOUTHWASH W/LIDOCAINE
10.0000 mL | Freq: Four times a day (QID) | ORAL | Status: DC | PRN
  Administered 2019-03-10: 10 mL via ORAL
  Filled 2019-03-09 (×3): qty 10

## 2019-03-09 NOTE — Progress Notes (Signed)
CSW attempted to meet with the patient at bedside. Patient was asleep. CSW provided SA resources to the patient's nurse.   CSW signing off. If CSW is needed again please consult again.   Drucilla Schmidt, MSW, LCSW-A Clinical Social Worker [employer]

## 2019-03-09 NOTE — Progress Notes (Signed)
Echocardiogram 2D Echocardiogram has been performed.  [name] 03/09/2019, 2:12 PM

## 2019-03-09 NOTE — Progress Notes (Signed)
Family Medicine Teaching Service Daily Progress Note Intern Pager: [phone]  Patient name: [name] Medical record number: [id number] Date of birth: 1986-05-05 Age: 33 y.o. Gender: female  Primary Care Provider: [facility] [place] Medical Consultants: none Code Status: full  Pt Overview and Major Events to Date:  5/23 - admitted.  Given CTX and vanc  Assessment and Plan: [name] is a 33 y.o. female presenting with approximately 1 week of fever, dysuria and skin abscess that has failed outpatient Abx, and found to have pyelonephritis.  [name] Kitchen PMH is significant for DM-I, substance abuse, anxiety.   Pyelonephritis -5/14 a urine cx was positive for > 100k E.coli.  failed o/p doxy and macrobid. This morning afebrile. Wbc 18.1>12.0.  .    - day 2 IV CTX 2g qd (5/21-) and vanc per pharm (5/21 - ) - f/u Urine and blood Cx - daily weights - I/O per shift - vitals per routine. Pulse ox w/ vitals - tylenol PRN  Skin Abscess - located on right wrist anteriorly. I&D was performed last Thursday and pt was put on macrobid and doxycycline for that and UTI.  MRSA grew on culture alst week. HIV pending -vancomycin per pharm consult - f/u blood cx  Thrush - thrush visible on anterior tongue.  See media file.  Developed after starting abx, which patient states has happened to her once before when taking abx. Appears to have multiple painful ulcers on her tongue as well.  If there is concern for esophageal candidiasis then we can order EGD if needed. Thrush likely 2/2 immunocompromised status from longstanding poorly controlled DM-I but need to r/o HIV as well.  - nystatin swish and swallow - chloraseptic throat spray PRN  Diabetes Mellitus- I -   AM glucose 212. Received 15 U lantus and 4 U novolog. She takes 35 U lantus at home with novolog at meals.  All documented A1c's on epic dating back to 2012 have been > 10.9. A1c this admission 11.2  -  Start lantus 15U qhs, increase if needed.    - sSSI w/ meals and qhs  Systolic Murmur - systolic murmur heard most loudly over the left upper sternal border.  possibly 2/2 endocarditis or HF d/t poorly controlled DM-I. BNP is 352, no previous BNP on file.  Giving 2g instead of 1g IV CTX until endocarditis ruled out.  - TTE, consider f/u TEE if still suspecting endocarditis.   - AM EKG  Substance use disorder - positive for cocaine on UDS.  Admits to marijuana use.  Smokes 0.5-1 ppd.  Denies alcohol use.   - consult to social work.  - acute hep panel pending  Hypokalemia. K 3.4.  Am K 3.4 - repleted - monitor on BMP - f/u mag  FEN/GI: carb modified, pantoprazole Prophylaxis: lovenox   Disposition: home  Subjective:  Feels a little better this morning.  Still feels feverish.  Abdominal pain slightly improved.   Objective: Temp:  [98.2 F (36.8 C)-101.7 F (38.7 C)] 98.2 F (36.8 C) (05/22 2206) Pulse Rate:  [59-90] 72 (05/22 2206) Resp:  [12-20] 18 (05/22 2206) BP: (130-175)/(78-91) 169/91 (05/22 2206) SpO2:  [92 %-99 %] 94 % (05/22 2206) Weight:  [45.6 kg-47.6 kg] 45.6 kg (05/22 2158) Physical Exam: General: sleeping initially.  Easily awoken.   Cardiovascular: systolic murmur heard best on right sternal border.   Respiratory: LCTAB.   Abdomen: soft, nontender.  Nbs.  Extremities: pedal edema bilaterally.   Laboratory: Recent Labs  Lab 03/08/19 1541  WBC 18.1*  HGB 10.6*  HCT 31.9*  PLT 354   Recent Labs  Lab 03/08/19 1541  NA 138  K 3.4*  CL 98  CO2 30  BUN 6  CREATININE 0.77  CALCIUM 8.2*  PROT 6.1*  BILITOT 0.5  ALKPHOS 145*  ALT 31  AST 12*  GLUCOSE 333*     Sandre Kitty, MD 03/09/2019, 12:48 AM PGY-1, [facility] Family Medicine FPTS Intern pager: [phone], text pages welcome

## 2019-03-10 LAB — CBC WITH DIFFERENTIAL/PLATELET
Abs Immature Granulocytes: 0.07 10*3/uL (ref 0.00–0.07)
Basophils Absolute: 0 10*3/uL (ref 0.0–0.1)
Basophils Relative: 0 %
Eosinophils Absolute: 0 10*3/uL (ref 0.0–0.5)
Eosinophils Relative: 0 %
HCT: 31.6 % — ABNORMAL LOW (ref 36.0–46.0)
Hemoglobin: 10.5 g/dL — ABNORMAL LOW (ref 12.0–15.0)
Immature Granulocytes: 1 %
Lymphocytes Relative: 13 %
Lymphs Abs: 1.7 10*3/uL (ref 0.7–4.0)
MCH: 28.8 pg (ref 26.0–34.0)
MCHC: 33.2 g/dL (ref 30.0–36.0)
MCV: 86.6 fL (ref 80.0–100.0)
Monocytes Absolute: 1 10*3/uL (ref 0.1–1.0)
Monocytes Relative: 8 %
Neutro Abs: 10.5 10*3/uL — ABNORMAL HIGH (ref 1.7–7.7)
Neutrophils Relative %: 78 %
Platelets: 345 10*3/uL (ref 150–400)
RBC: 3.65 MIL/uL — ABNORMAL LOW (ref 3.87–5.11)
RDW: 14.4 % (ref 11.5–15.5)
WBC: 13.3 10*3/uL — ABNORMAL HIGH (ref 4.0–10.5)
nRBC: 0 % (ref 0.0–0.2)

## 2019-03-10 LAB — BASIC METABOLIC PANEL
Anion gap: 10 (ref 5–15)
BUN: 5 mg/dL — ABNORMAL LOW (ref 6–20)
CO2: 30 mmol/L (ref 22–32)
Calcium: 8 mg/dL — ABNORMAL LOW (ref 8.9–10.3)
Chloride: 95 mmol/L — ABNORMAL LOW (ref 98–111)
Creatinine, Ser: 0.8 mg/dL (ref 0.44–1.00)
GFR calc Af Amer: >60 mL/min (ref >60)
GFR calc non Af Amer: >60 mL/min (ref >60)
Glucose, Bld: 449 mg/dL — ABNORMAL HIGH (ref 70–99)
Potassium: 5.1 mmol/L (ref 3.5–5.1)
Sodium: 135 mmol/L (ref 135–145)

## 2019-03-10 LAB — URINE CULTURE: Culture: 1000 — AB

## 2019-03-10 LAB — GLUCOSE, CAPILLARY
Glucose-Capillary: 232 mg/dL — ABNORMAL HIGH (ref 70–99)
Glucose-Capillary: 414 mg/dL — ABNORMAL HIGH (ref 70–99)

## 2019-03-10 MED ORDER — MAGNESIUM SULFATE 2 GM/50ML IV SOLN
2.0000 g | Freq: Once | INTRAVENOUS | Status: AC
  Administered 2019-03-10: 2 g via INTRAVENOUS
  Filled 2019-03-10: qty 50

## 2019-03-10 MED ORDER — MAGNESIUM SULFATE 50 % IJ SOLN: 2.0000 g | Freq: Once | INTRAMUSCULAR | Status: DC

## 2019-03-10 MED ORDER — MAGIC MOUTHWASH W/LIDOCAINE: 10.0000 mL | Freq: Four times a day (QID) | ORAL | 0 refills | Status: DC | PRN

## 2019-03-10 MED ORDER — VALACYCLOVIR HCL 1 G PO TABS: 2000.0000 mg | ORAL_TABLET | Freq: Two times a day (BID) | ORAL | 0 refills | Status: AC

## 2019-03-10 MED ORDER — CEFDINIR 300 MG PO CAPS: 300.0000 mg | ORAL_CAPSULE | Freq: Two times a day (BID) | ORAL | 0 refills | Status: AC

## 2019-03-10 MED ORDER — PHENOL 1.4 % MT LIQD: 1.0000 | OROMUCOSAL | 0 refills | Status: DC | PRN

## 2019-03-10 NOTE — Discharge Summary (Addendum)
[facility] Discharge Summary  Patient name: [name] Medical record number: [id number] Date of birth: October 31, 1985 Age: 33 y.o. Gender: female Date of Admission: 03/08/2019  Date of Discharge: 03/10/2019 Admitting Physician: Martyn Malay, MD  Primary Care Provider: [facility] [place] Consultants: None  Indication for Hospitalization: Sepsis  Discharge Diagnoses/Problem List:  Sepsis Pyelonephritis T1DM Skin Abscess Substance use disorder Hypokalemia  Disposition: Home  Discharge Condition: Improved  Discharge Exam:  General: pleasant thin lady, in no acute distress with non-toxic appearance, lying comfortably in bed on phone  HEENT: normocephalic, atraumatic, large 0.5x3cm ulcer on tongue, no signs of thrush, oropharynx otherwise clear without erythema or exudate Neck: supple CV: regular rate and rhythm without murmurs, rubs, or gallops, trace lower extremity swelling, 2+ radial and pedal pulses bilaterally Lungs: clear to auscultation bilaterally with normal work of breathing Abdomen: soft, non-tender, non-distended, normoactive bowel sounds Skin: right anterior wrist wound well appearing, only mild circumferential erythema surrounding incision, no incresaed warmth, drainage, or bleeding Extremities: warm and well perfused, normal tone Neuro: Alert and oriented, speech normal Genital exam: VULVA: normal appearing vulva with no masses, tenderness or lesions.  Brief Hospital Course:  [name] is a 33 y.o. female with past medical history significant for poorly controlled T1DM, polysubstance abuse, and trichomonas, who presented with 10 days of fever, dysuria,  worsening flank pain in the setting of a known UTI that failed outpatient therapy (Doxy and Macrobid) and found to have pyelonephritis. Urine culture obtained on 5/14 was positive for >100K E.coli and she met 2/4 SIRS criteria for leukocytosis of 18.1 and fever of 101.7. She  was started on IV Ceftriaxone with improvement in her symptoms. Given her history of polysubstance abuse and positive UDS for cocaine on admission (although denied any IV drug use), TTE was obtained to rule out endocarditis which was negative. Repeat urine culture significant for 1K colonies of GBS. Blood cultures negative to date. Leukocytosis resolved and patient remained hemodynamically stable and afebrile for >24 hours. Patient was discharged with Cefdinir for total of 10 day treatment. Additionally, due to her high risk behavior, gonorrhea/chlamydia testing was also obtained and pending at time of discharge.  Right arm abscess: Patient had anterior right arm abscess s/p I&D on 5/14 by [facility]. She was treated with Doxycycline and Macrobid for both abscess and her UTI. Wound culture returned positive for MRSA. Patient was treated with IV Vancomycin throughout admission and switched to PO Cefdinir on discharge with anticipated cross coverage for her pyelonephritis. On day of discharge, right arm was well appearing without any signs of infection.   Oral Ulcer: On admission, patient also endorsed development of an oral ulcer 3 days prior to admission with some dysphagia and sore throat. Some concern initially for oral thrush thus she was given oral nystatin. 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 HSV culture obtained and pending at discharge. No genital lesions appreciated on exam. Patient was treated prophylactically with Valtrex 1g BID x 7 days and discharged with Magic Mouthwash for the pain.  Issues for Follow Up:  1. Ensure patient finishes antibiotic and Valtrex  2. Follow up GC/Cl and treat accordingly  Significant Procedures: Echo  Significant Labs and Imaging:  Recent Labs  Lab 03/08/19 1541 03/09/19 0455 03/10/19 0843  WBC 18.1* 12.0* 13.3*  HGB 10.6* 9.7* 10.5*  HCT 31.9* 28.7* 31.6*  PLT 354 328 345   Recent Labs  Lab  03/08/19 1541 03/09/19 0455  03/10/19 0843  NA 138 140 135  K 3.4* 3.4* 5.1  CL 98 102 95*  CO2 '30 31 30  '$ GLUCOSE 333* 212* 449*  BUN 6 5* 5*  CREATININE 0.77 0.66 0.80  CALCIUM 8.2* 8.1* 8.0*  MG  --  1.7  --   ALKPHOS 145*  --   --   AST 12*  --   --   ALT 31  --   --   ALBUMIN 1.8*  --   --     Lipase; 17 BNP: 352.8 HIV: non-reactive Hgb A1C: 11.2 Mag: 1.7 Urine culture 5/14: >100K E.coli Urine culture 5/22: 1K GBS with mixed organisms Blood culture: NEg x 2 days COVID neg Hepatitis panel; pending HSV: pending GC/Cl: pending  CXR 03/08/19 IMPRESSION: No edema or consolidation.  Echo: 03/09/19 IMPRESSION: 1. The left ventricle has normal systolic function with an ejection fraction of 60-65%. The cavity size was normal. Left ventricular diastolic parameters were normal. 2. The right ventricle has normal systolic function. The cavity was normal. There is no increase in right ventricular wall thickness. 3. The interatrial septum was not assessed.  Results/Tests Pending at Time of Discharge: Hepatitis panel, Gonorrhea/Chlamydia, HSV  Discharge Medications:  Allergies as of 03/10/2019      Reactions   Codeine Anaphylaxis, Nausea And Vomiting   Penicillins Anaphylaxis, Nausea And Vomiting   Has patient had a PCN reaction causing immediate rash, facial/tongue/throat swelling, SOB or lightheadedness with hypotension: Yes Has patient had a PCN reaction causing severe rash involving mucus membranes or skin necrosis: Yes Has patient had a PCN reaction that required hospitalization Yes Has patient had a PCN reaction occurring within the last 10 years: No If all of the above answers are "NO", then may proceed with Cephalosporin use.   Tramadol Itching      Medication List    STOP taking these medications   doxycycline 100 MG capsule Commonly known as:  VIBRAMYCIN   nitrofurantoin (macrocrystal-monohydrate) 100 MG capsule Commonly known as:  MACROBID     TAKE  these medications   acetaminophen 500 MG tablet Commonly known as:  TYLENOL Take 1,500 mg by mouth every 6 (six) hours as needed for mild pain, moderate pain or fever.   cefdinir 300 MG capsule Commonly known as:  OMNICEF Take 1 capsule (300 mg total) by mouth 2 (two) times daily for 6 days. Start taking on:  Mar 11, 2019   gabapentin 100 MG capsule Commonly known as:  NEURONTIN Take 200 mg by mouth at bedtime.   HYDROcodone-acetaminophen 5-325 MG tablet Commonly known as:  NORCO/VICODIN Take 1 tablet by mouth every 4 (four) hours as needed.   Insulin Glargine 100 UNIT/ML Solostar Pen Commonly known as:  Lantus SoloStar Inject 35 Units into the skin daily at 10 pm.   Linzess 290 MCG Caps capsule Generic drug:  linaclotide Take 290 mcg by mouth daily before breakfast.   magic mouthwash w/lidocaine Soln Take 10 mLs by mouth 4 (four) times daily as needed for mouth pain.   NovoLOG FlexPen 100 UNIT/ML FlexPen Generic drug:  insulin aspart Inject 1-7 Units into the skin 3 (three) times daily with meals. Dose based on CBG and carb count   omeprazole 40 MG capsule Commonly known as:  PRILOSEC Take 40 mg by mouth daily before breakfast.   phenol 1.4 % Liqd Commonly known as:  CHLORASEPTIC Use as directed 1 spray in the mouth or throat as needed for throat irritation / pain.   valACYclovir  1000 MG tablet Commonly known as:  Valtrex Take 2 tablets (2,000 mg total) by mouth 2 (two) times daily for 7 days.       Discharge Instructions: Please refer to Patient Instructions section of EMR for full details.  Patient was counseled important signs and symptoms that should prompt return to medical care, changes in medications, dietary instructions, activity restrictions, and follow up appointments.   Follow-Up Appointments: Follow-up [place]. Schedule an appointment as soon as possible for a visit.   Why:  this week for a hospital follow up Contact  information: [place] [phone] [place], [place], DO 03/10/2019, 12:23 PM PGY-1, [place]

## 2019-03-10 NOTE — Progress Notes (Signed)
Discharge instructions (including medications) discussed with and copy provided to patient/caregiver.  [name] R

## 2019-03-10 NOTE — Discharge Instructions (Signed)
Pyelonephritis, Adult Pyelonephritis is a kidney infection. The kidneys are the organs that filter a person's blood and move waste out of the bloodstream and into the urine. Urine passes from the kidneys, through the ureters, and into the bladder. There are two main types of pyelonephritis:  Infections that come on quickly without any warning (acute pyelonephritis).  Infections that last for a long period of time (chronic pyelonephritis). In most cases, the infection clears up with treatment and does not cause further problems. More severe infections or chronic infections can sometimes spread to the bloodstream or lead to other problems with the kidneys. What are the causes? This condition is usually caused by:  Bacteria traveling from the bladder to the kidney through infected urine. The urine in the bladder can become infected with bacteria from: ? Bladder infection (cystitis). ? Inflammation of the prostate gland (prostatitis). ? Sexual intercourse, in females.  Bacteria traveling from the bloodstream to the kidney. What increases the risk? This condition is more likely to develop in:  Pregnant women.  Older people.  People who have diabetes.  People who have kidney stones or bladder stones.  People who have other abnormalities of the kidney or ureter.  People who have a catheter placed in the bladder.  People who have cancer.  People who are sexually active.  Women who use spermicides.  People who have had a prior urinary tract infection. What are the signs or symptoms? Symptoms of this condition include:  Frequent urination.  Strong or persistent urge to urinate.  Burning or stinging when urinating.  Abdominal pain.  Back pain.  Pain in the side or flank area.  Fever.  Chills.  Blood in the urine, or dark urine.  Nausea.  Vomiting. How is this diagnosed? This condition may be diagnosed based on:  Medical history and physical exam.  Urine  tests.  Blood tests. You may also have imaging tests of the kidneys, such as an ultrasound or CT scan. How is this treated? Treatment for this condition may depend on the severity of the infection.  If the infection is mild and is found early, you may be treated with antibiotic medicines taken by mouth. You will need to drink fluids to remain hydrated.  If the infection is more severe, you may need to stay in the hospital and receive antibiotics given directly into a vein through an IV tube. You may also need to receive fluids through an IV tube if you are not able to remain hydrated. After your hospital stay, you may need to take oral antibiotics for a period of time. Other treatments may be required, depending on the cause of the infection. Follow these instructions at home: Medicines  Take over-the-counter and prescription medicines only as told by your health care provider.  If you were prescribed an antibiotic medicine, take it as told by your health care provider. Do not stop taking the antibiotic even if you start to feel better. General instructions  Drink enough fluid to keep your urine clear or pale yellow.  Avoid caffeine, tea, and carbonated beverages. They tend to irritate the bladder.  Urinate often. Avoid holding in urine for long periods of time.  Urinate before and after sex.  After a bowel movement, women should cleanse from front to back. Use each tissue only once.  Keep all follow-up visits as told by your health care provider. This is important. Contact a health care provider if:  Your symptoms do not get better after  2 days of treatment.  Your symptoms get worse.  You have a fever. Get help right away if:  You are unable to take your antibiotics or fluids.  You have shaking chills.  You vomit.  You have severe flank or back pain.  You have extreme weakness or fainting. This information is not intended to replace advice given to you by your health  care provider. Make sure you discuss any questions you have with your health care provider. Document Released: 10/03/2005 Document Revised: 03/10/2016 Document Reviewed: 01/26/2015 Elsevier Interactive Patient Education  2019 [employer] Inc[employer]  Diabetes Basics  Diabetes (diabetes mellitus) is a long-term (chronic) disease. It occurs when the body does not properly use sugar (glucose) that is released from food after you eat. Diabetes may be caused by one or both of these problems:  Your pancreas does not make enough of a hormone called insulin.  Your body does not react in a normal way to insulin that it makes. Insulin lets sugars (glucose) go into cells in your body. This gives you energy. If you have diabetes, sugars cannot get into cells. This causes high blood sugar (hyperglycemia). Follow these instructions at home: How is diabetes treated? You may need to take insulin or other diabetes medicines daily to keep your blood sugar in balance. Take your diabetes medicines every day as told by your doctor. List your diabetes medicines here: Diabetes medicines  Name of medicine: ______________________________ ? Amount (dose): _______________ Time (a.m./p.m.): _______________ Notes: ___________________________________  Name of medicine: ______________________________ ? Amount (dose): _______________ Time (a.m./p.m.): _______________ Notes: ___________________________________  Name of medicine: ______________________________ ? Amount (dose): _______________ Time (a.m./p.m.): _______________ Notes: ___________________________________ If you use insulin, you will learn how to give yourself insulin by injection. You may need to adjust the amount based on the food that you eat. List the types of insulin you use here: Insulin  Insulin type: ______________________________ ? Amount (dose): _______________ Time (a.m./p.m.): _______________ Notes: ___________________________________  Insulin type:  ______________________________ ? Amount (dose): _______________ Time (a.m./p.m.): _______________ Notes: ___________________________________  Insulin type: ______________________________ ? Amount (dose): _______________ Time (a.m./p.m.): _______________ Notes: ___________________________________  Insulin type: ______________________________ ? Amount (dose): _______________ Time (a.m./p.m.): _______________ Notes: ___________________________________  Insulin type: ______________________________ ? Amount (dose): _______________ Time (a.m./p.m.): _______________ Notes: ___________________________________ How do I manage my blood sugar?  Check your blood sugar levels using a blood glucose monitor as directed by your doctor. Your doctor will set treatment goals for you. Generally, you should have these blood sugar levels:  Before meals (preprandial): 80-130 mg/dL (8.4-1.6 mmol/L).  After meals (postprandial): below 180 mg/dL (10 mmol/L).  A1c level: less than 7%. Write down the times that you will check your blood sugar levels: Blood sugar checks  Time: _______________ Notes: ___________________________________  Time: _______________ Notes: ___________________________________  Time: _______________ Notes: ___________________________________  Time: _______________ Notes: ___________________________________  Time: _______________ Notes: ___________________________________  Time: _______________ Notes: ___________________________________  What do I need to know about low blood sugar? Low blood sugar is called hypoglycemia. This is when blood sugar is at or below 70 mg/dL (3.9 mmol/L). Symptoms may include:  Feeling: ? Hungry. ? Worried or nervous (anxious). ? Sweaty and clammy. ? Confused. ? Dizzy. ? Sleepy. ? Sick to your stomach (nauseous).  Having: ? A fast heartbeat. ? A headache. ? A change in your vision. ? Tingling or no feeling (numbness) around the mouth, lips, or  tongue. ? Jerky movements that you cannot control (seizure).  Having trouble with: ? Moving (coordination). ? Sleeping. ? Passing out (  fainting). ? Getting upset easily (irritability). Treating low blood sugar To treat low blood sugar, eat or drink something sugary right away. If you can think clearly and swallow safely, follow the 15:15 rule:  Take 15 grams of a fast-acting carb (carbohydrate). Talk with your doctor about how much you should take.  Some fast-acting carbs are: ? Sugar tablets (glucose pills). Take 3-4 glucose pills. ? 6-8 pieces of hard candy. ? 4-6 oz (120-150 mL) of fruit juice. ? 4-6 oz (120-150 mL) of regular (not diet) soda. ? 1 Tbsp (15 mL) honey or sugar.  Check your blood sugar 15 minutes after you take the carb.  If your blood sugar is still at or below 70 mg/dL (3.9 mmol/L), take 15 grams of a carb again.  If your blood sugar does not go above 70 mg/dL (3.9 mmol/L) after 3 tries, get help right away.  After your blood sugar goes back to normal, eat a meal or a snack within 1 hour. Treating very low blood sugar If your blood sugar is at or below 54 mg/dL (3 mmol/L), you have very low blood sugar (severe hypoglycemia). This is an emergency. Do not wait to see if the symptoms will go away. Get medical help right away. Call your local emergency services (911 in the [place] Do not drive yourself to the hospital. Questions to ask your health care provider  Do I need to meet with a diabetes educator?  What equipment will I need to care for myself at home?  What diabetes medicines do I need? When should I take them?  How often do I need to check my blood sugar?  What number can I call if I have questions?  When is my next doctor's visit?  Where can I find a support group for people with diabetes? Where to find more information  American Diabetes Association: www.diabetes.org  American Association of Diabetes Educators:  www.diabeteseducator.org/patient-resources Contact a doctor if:  Your blood sugar is at or above 240 mg/dL (70.3 mmol/L) for 2 days in a row.  You have been sick or have had a fever for 2 days or more, and you are not getting better.  You have any of these problems for more than 6 hours: ? You cannot eat or drink. ? You feel sick to your stomach (nauseous). ? You throw up (vomit). ? You have watery poop (diarrhea). Get help right away if:  Your blood sugar is lower than 54 mg/dL (3 mmol/L).  You get confused.  You have trouble: ? Thinking clearly. ? Breathing. Summary  Diabetes (diabetes mellitus) is a long-term (chronic) disease. It occurs when the body does not properly use sugar (glucose) that is released from food after digestion.  Take insulin and diabetes medicines as told.  Check your blood sugar every day, as often as told.  Keep all follow-up visits as told by your doctor. This is important. This information is not intended to replace advice given to you by your health care provider. Make sure you discuss any questions you have with your health care provider. Document Released: 01/05/2018 Document Revised: 03/26/2018 Document Reviewed: 01/05/2018 Elsevier Interactive Patient Education  2019 [employer]. You were hospitalized at [facility] for a kidney infection.  You were treated with IV antibiotics with improvement.  You will need to continue antibiotics at discharge until completed. The antibiotic is called Cefdinir, you will take this twice a day, the first dose being tonight around 9pm. We have also prescribed you Valtrex  for your mouth ulcer. You will need to take this twice a day for 7 days as well. I have prescribed you magic mouthwash for the pain. Please continue your home insulin regimen. I highly recommend you follow up with your PCP this coming week to ensure your infections are resolving appropriately. If you develop fever or worsening pain or your right wrist  becomes more red and painful, I recommend you see a doctor sooner.  Thank you for allowing [place] to take care of you.

## 2019-03-12 LAB — HEPATITIS PANEL, ACUTE
HCV Ab: 0.2 s/co ratio (ref 0.0–0.9)
Hep A IgM: NEGATIVE
Hep B C IgM: NEGATIVE
Hepatitis B Surface Ag: NEGATIVE

## 2019-03-13 ENCOUNTER — Telehealth: Payer: Self-pay | Admitting: Family Medicine

## 2019-03-13 LAB — CULTURE, BLOOD (ROUTINE X 2)
Culture: NO GROWTH
Culture: NO GROWTH
Special Requests: ADEQUATE
Special Requests: ADEQUATE

## 2019-03-13 NOTE — Telephone Encounter (Signed)
Patient called regarding results--- HSV not yet resulted. All questions answered will call at below number once resulted. [phone] Terisa Starr, MD  Family Medicine Teaching Service

## 2019-03-18 LAB — HSV DNA BY PCR (REFERENCE LAB)
HSV 1 DNA: POSITIVE — AB
HSV 2 DNA: NEGATIVE

## 2019-06-01 IMAGING — CR DG CHEST 2V
2 series · 2 of 2 positions shown · non-contrast
Comparison: Chest x-ray dated September 15, 2018.

CLINICAL DATA: Shortness of breath.

EXAM:
CHEST - 2 VIEW

[chest pa]
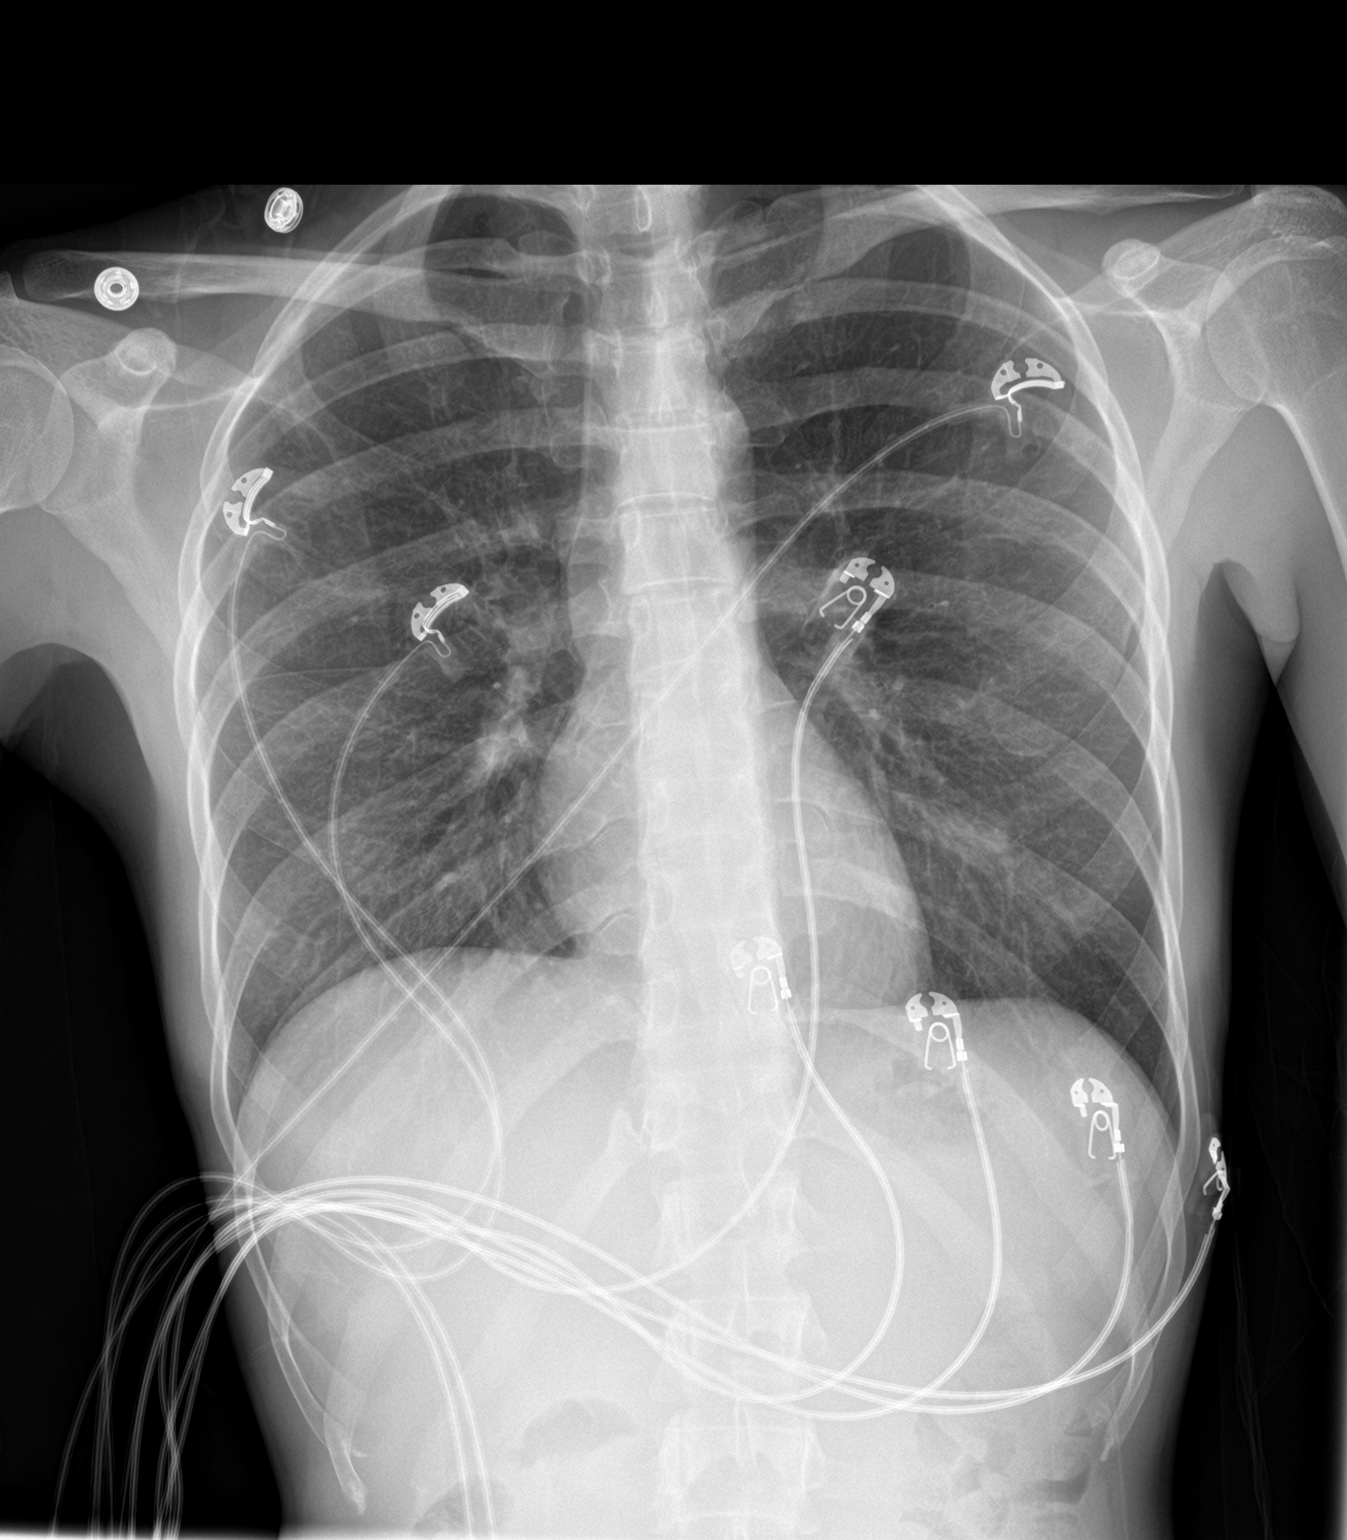

[chest lat]
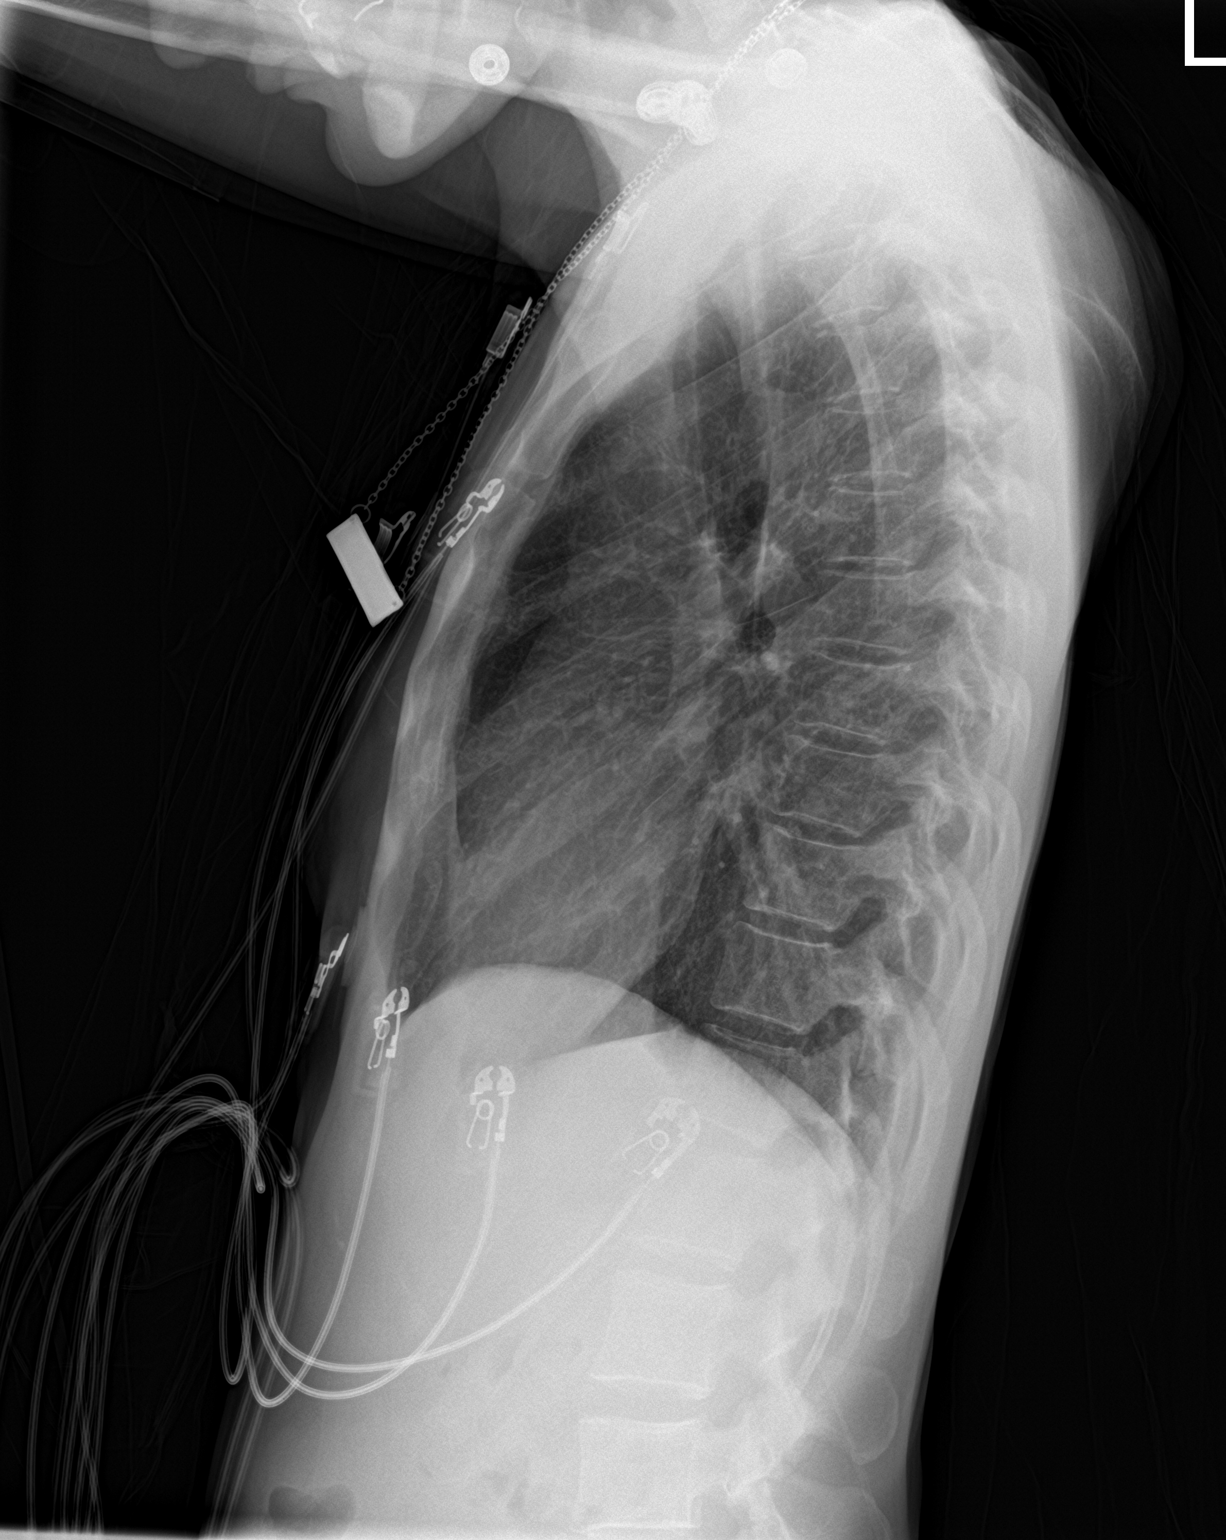

[2 of 2 positions shown; findings below may reference images not displayed]

FINDINGS: The heart size and mediastinal contours are within normal limits.
Both lungs are clear. The visualized skeletal structures are
unremarkable.
IMPRESSION: No active cardiopulmonary disease.

## 2019-06-12 ENCOUNTER — Emergency Department (HOSPITAL_COMMUNITY)
Admission: EM | Admit: 2019-06-12 | Discharge: 2019-06-12 | Discharge status: Against Medical Advice | Payer: Medicaid Other | Attending: Emergency Medicine | Admitting: Emergency Medicine

## 2019-06-12 ENCOUNTER — Encounter (HOSPITAL_COMMUNITY): Payer: Self-pay

## 2019-06-12 ENCOUNTER — Other Ambulatory Visit: Payer: Self-pay

## 2019-06-12 DIAGNOSIS — Z79899 Other long term (current) drug therapy: Secondary | ICD-10-CM | POA: Diagnosis not present

## 2019-06-12 DIAGNOSIS — F17210 Nicotine dependence, cigarettes, uncomplicated: Secondary | ICD-10-CM | POA: Insufficient documentation

## 2019-06-12 DIAGNOSIS — R739 Hyperglycemia, unspecified: Secondary | ICD-10-CM

## 2019-06-12 DIAGNOSIS — R109 Unspecified abdominal pain: Secondary | ICD-10-CM

## 2019-06-12 DIAGNOSIS — E1065 Type 1 diabetes mellitus with hyperglycemia: Secondary | ICD-10-CM | POA: Diagnosis not present

## 2019-06-12 DIAGNOSIS — Z794 Long term (current) use of insulin: Secondary | ICD-10-CM | POA: Diagnosis not present

## 2019-06-12 LAB — RAPID URINE DRUG SCREEN, HOSP PERFORMED
Amphetamines: NOT DETECTED
Barbiturates: NOT DETECTED
Benzodiazepines: NOT DETECTED
Cocaine: POSITIVE — AB
Opiates: NOT DETECTED
Tetrahydrocannabinol: NOT DETECTED

## 2019-06-12 LAB — COMPREHENSIVE METABOLIC PANEL
ALT: 20 U/L (ref 0–44)
AST: 16 U/L (ref 15–41)
Albumin: 4.1 g/dL (ref 3.5–5.0)
Alkaline Phosphatase: 94 U/L (ref 38–126)
Anion gap: 16 — ABNORMAL HIGH (ref 5–15)
BUN: 25 mg/dL — ABNORMAL HIGH (ref 6–20)
CO2: 26 mmol/L (ref 22–32)
Calcium: 10.1 mg/dL (ref 8.9–10.3)
Chloride: 87 mmol/L — ABNORMAL LOW (ref 98–111)
Creatinine, Ser: 0.98 mg/dL (ref 0.44–1.00)
GFR calc Af Amer: >60 mL/min (ref >60)
GFR calc non Af Amer: >60 mL/min (ref >60)
Glucose, Bld: 688 mg/dL (ref 70–99)
Potassium: 4.6 mmol/L (ref 3.5–5.1)
Sodium: 129 mmol/L — ABNORMAL LOW (ref 135–145)
Total Bilirubin: 0.6 mg/dL (ref 0.3–1.2)
Total Protein: 8.6 g/dL — ABNORMAL HIGH (ref 6.5–8.1)

## 2019-06-12 LAB — URINALYSIS, ROUTINE W REFLEX MICROSCOPIC
Bilirubin Urine: NEGATIVE
Glucose, UA: >=500 mg/dL — AB
Hgb urine dipstick: NEGATIVE
Ketones, ur: 5 mg/dL — AB
Nitrite: NEGATIVE
Protein, ur: NEGATIVE mg/dL
Specific Gravity, Urine: 1.025 (ref 1.005–1.030)
pH: 8 (ref 5.0–8.0)

## 2019-06-12 LAB — CBG MONITORING, ED: Glucose-Capillary: >600 mg/dL (ref 70–99)

## 2019-06-12 LAB — CBC
HCT: 44.3 % (ref 36.0–46.0)
Hemoglobin: 14.4 g/dL (ref 12.0–15.0)
MCH: 29.5 pg (ref 26.0–34.0)
MCHC: 32.5 g/dL (ref 30.0–36.0)
MCV: 90.8 fL (ref 80.0–100.0)
Platelets: 438 10*3/uL — ABNORMAL HIGH (ref 150–400)
RBC: 4.88 MIL/uL (ref 3.87–5.11)
RDW: 13.2 % (ref 11.5–15.5)
WBC: 11.9 10*3/uL — ABNORMAL HIGH (ref 4.0–10.5)
nRBC: 0 % (ref 0.0–0.2)

## 2019-06-12 LAB — POC URINE PREG, ED: Preg Test, Ur: NEGATIVE

## 2019-06-12 LAB — LIPASE, BLOOD: Lipase: 14 U/L (ref 11–51)

## 2019-06-12 MED ORDER — INSULIN ASPART 100 UNIT/ML ~~LOC~~ SOLN
5.0000 [IU] | Freq: Once | SUBCUTANEOUS | Status: DC
  Filled 2019-06-12: qty 1

## 2019-06-12 MED ORDER — ONDANSETRON HCL 4 MG/2ML IJ SOLN
4.0000 mg | Freq: Once | INTRAMUSCULAR | Status: AC
  Administered 2019-06-12: 20:00:00 4 mg via INTRAVENOUS
  Filled 2019-06-12: qty 2

## 2019-06-12 MED ORDER — SODIUM CHLORIDE 0.9 % IV BOLUS: 1000.0000 mL | Freq: Once | INTRAVENOUS | Status: DC

## 2019-06-12 MED ORDER — PANTOPRAZOLE SODIUM 40 MG IV SOLR
40.0000 mg | Freq: Once | INTRAVENOUS | Status: DC
  Filled 2019-06-12: qty 40

## 2019-06-12 MED ORDER — SODIUM CHLORIDE 0.9 % IV SOLN
Freq: Once | INTRAVENOUS | Status: AC
  Administered 2019-06-12: 20:00:00 via INTRAVENOUS

## 2019-06-12 NOTE — ED Notes (Signed)
This nurse went in to give medication to pt. Neither pt nor visitor was in room. At approximately 2025 that pt needed to go to bathroom. This nurse did not see pt return from bathroom. Normal Saline IVF running in left forearm. Pt not in facility. Security, [employer], and Dr Lacinda Axon notified. [employer] and [employer] notified of going to pt address to evaluate status of IV access and to remove access as necessary.

## 2019-06-12 NOTE — ED Notes (Signed)
CRITICAL VALUE ALERT  Critical Value:  Blood Glucose 688  Date & Time Notied: 06/12/2019 2027  Provider Notified: Nat Christen, MD  Orders Received/Actions taken: n/a

## 2019-06-12 NOTE — ED Provider Notes (Addendum)
[facility] EMERGENCY DEPARTMENT Provider Note   CSN: [id number] Arrival date & time: 06/12/19  1844     History   Chief Complaint Chief Complaint  Patient presents with  . Hyperglycemia  . Abdominal Pain    HPI [name] is a 33 y.o. female.     Level 5 caveat for acuity of condition.  Long-term type I diabetic presents with elevated glucose and vomiting c questionable blood-tinged gastric contents.  Glucose greater than 600 at home.  She has been taking her insulin.  Past medical history includes DKA, polysubstance abuse, malnutrition, others.     Past Medical History:  Diagnosis Date  . Acid reflux 2010  . Diabetes mellitus   . DKA 05/01/2011  . Polysubstance abuse [facility])     Patient Active Problem List   Diagnosis Date Noted  . Pyelonephritis 03/08/2019  . Abscess   . Systolic murmur   . Hyperglycemia due to type 1 diabetes mellitus (HCC) 10/28/2018  . Substance abuse (HCC) 09/15/2018  . Tobacco abuse 09/15/2018  . Cough 09/15/2018  . Protein-calorie malnutrition, severe (HCC) 09/15/2018  . GERD (gastroesophageal reflux disease) 09/15/2018  . Nausea & vomiting   . Acute lower UTI 07/14/2016  . Panic disorder without agoraphobia with moderate panic attacks 01/20/2016  . Benzodiazepine abuse (HCC) 01/20/2016  . Substance induced mood disorder (HCC) 01/19/2016  . DKA 05/01/2011  . Type 1 diabetes mellitus with other specified complication (HCC) 05/01/2011  . Leukocytosis 05/01/2011    Past Surgical History:  Procedure Laterality Date  . TUBAL LIGATION    . WOUND EXPLORATION Left 07/26/2014   Procedure: Irrigation, debridement and exploration of elbow wounds;  Surgeon: Betha Loa, MD;  Location: [facility] OR;  Service: Orthopedics;  Laterality: Left;     OB History    Gravida  2   Para  2   Term      Preterm      AB      Living        SAB      TAB      Ectopic      Multiple      Live Births               Home Medications     Prior to Admission medications   Medication Sig Start Date End Date Taking? Authorizing Provider  acetaminophen (TYLENOL) 500 MG tablet Take 1,500 mg by mouth every 6 (six) hours as needed for mild pain, moderate pain or fever.     [provider]  gabapentin (NEURONTIN) 100 MG capsule Take 200 mg by mouth at bedtime. 08/24/18   [provider]  HYDROcodone-acetaminophen (NORCO/VICODIN) 5-325 MG tablet Take 1 tablet by mouth every 4 (four) hours as needed. 02/28/19   Ivery Quale, PA-C  insulin aspart (NOVOLOG FLEXPEN) 100 UNIT/ML FlexPen Inject 1-7 Units into the skin 3 (three) times daily with meals. Dose based on CBG and carb count    [provider]  Insulin Glargine (LANTUS SOLOSTAR) 100 UNIT/ML Solostar Pen Inject 35 Units into the skin daily at 10 pm. 07/15/16   Lonia Blood, MD  linaclotide (LINZESS) 290 MCG CAPS capsule Take 290 mcg by mouth daily before breakfast.    [provider]  magic mouthwash w/lidocaine SOLN Take 10 mLs by mouth 4 (four) times daily as needed for mouth pain. 03/10/19   Mullis, Kiersten P, DO  omeprazole (PRILOSEC) 40 MG capsule Take 40 mg by mouth daily before breakfast.  [provider]  phenol (CHLORASEPTIC) 1.4 % LIQD Use as directed 1 spray in the mouth or throat as needed for throat irritation / pain. 03/10/19   Mullis, Archie Endo, DO    Family History Family History  Problem Relation Age of Onset  . Coronary artery disease Mother   . Lung cancer Mother   . Heart attack Father 48  . Thyroid disease Maternal Grandmother   . Diabetes type I Daughter     Social History Social History   Tobacco Use  . Smoking status: Current Every Day Smoker    Packs/day: 1.00    Years: 15.00    Pack years: 15.00    Types: Cigarettes  . Smokeless tobacco: Never Used  . Tobacco comment: She is currently trying to quit.  Substance Use Topics  . Alcohol use: No  . Drug use: No    Comment: HX of cocaine and  marijuana use     Allergies   Codeine, Penicillins, and Tramadol   Review of Systems Review of Systems  Unable to perform ROS: Acuity of condition     Physical Exam Updated Vital Signs BP (!) 136/95 (BP Location: Right Arm)   Pulse (!) 116   Temp 98.2 F (36.8 C) (Oral)   Resp 16   Ht 5' (1.524 m)   Wt 47.6 kg   LMP 05/18/2019   SpO2 98%   BMI 20.51 kg/m   Physical Exam Vitals signs and nursing note reviewed.  Constitutional:      Comments: Thin, dehydrated  HENT:     Head: Normocephalic and atraumatic.  Eyes:     Conjunctiva/sclera: Conjunctivae normal.  Neck:     Musculoskeletal: Neck supple.  Cardiovascular:     Rate and Rhythm: Normal rate and regular rhythm.  Pulmonary:     Effort: Pulmonary effort is normal.     Breath sounds: Normal breath sounds.  Abdominal:     General: Bowel sounds are normal.     Palpations: Abdomen is soft.     Comments: Minimal epigastric tenderness.  Musculoskeletal: Normal range of motion.  Skin:    General: Skin is warm and dry.  Neurological:     Mental Status: She is alert and oriented to person, place, and time.  Psychiatric:        Behavior: Behavior normal.      ED Treatments / Results  Labs (all labs ordered are listed, but only abnormal results are displayed) Labs Reviewed  CBC - Abnormal; Notable for the following components:      Result Value   WBC 11.9 (*)    Platelets 438 (*)    All other components within normal limits  URINALYSIS, ROUTINE W REFLEX MICROSCOPIC - Abnormal; Notable for the following components:   APPearance HAZY (*)    Glucose, UA >=500 (*)    Ketones, ur 5 (*)    Leukocytes,Ua TRACE (*)    Bacteria, UA RARE (*)    All other components within normal limits  RAPID URINE DRUG SCREEN, HOSP PERFORMED - Abnormal; Notable for the following components:   Cocaine POSITIVE (*)    All other components within normal limits  CBG MONITORING, ED - Abnormal; Notable for the following components:    Glucose-Capillary >600 (*)    All other components within normal limits  LIPASE, BLOOD  COMPREHENSIVE METABOLIC PANEL  POC URINE PREG, ED    EKG None  Radiology No results found.  Procedures Procedures (including critical care time)  Medications Ordered  in ED Medications  sodium chloride 0.9 % bolus 1,000 mL (has no administration in time range)  pantoprazole (PROTONIX) injection 40 mg (has no administration in time range)  ondansetron (ZOFRAN) injection 4 mg (4 mg Intravenous Given 06/12/19 1950)  0.9 %  sodium chloride infusion ( Intravenous New Bag/Given 06/12/19 1951)     Initial Impression / Assessment and Plan / ED Course  I have reviewed the triage vital signs and the nursing notes.  Pertinent labs & imaging results that were available during my care of the patient were reviewed by me and considered in my medical decision making (see chart for details).        Patient presents with hyperglycemia, probable DKA.  IV fluids, IV Protonix, IV Zofran, IV insulin.    2045:   RN reported that patient snuck out of the hospital with her IV in place.  This event was not witnessed by the nursing staff nor by me.  The sheriff's department was notified.  Final Clinical Impressions(s) / ED Diagnoses   Final diagnoses:  Hyperglycemia  Abdominal pain, unspecified abdominal location    ED Discharge Orders    None       Donnetta Hutchingook, Aerik Polan, MD 06/12/19 2019    Donnetta Hutchingook, Derrek Puff, MD 06/12/19 2119

## 2019-06-12 NOTE — ED Notes (Signed)
Triage POC CBG: over 600, meter reads "high", pt reports she last took her novalog sliding scale yesterday, last used lantus last night (20 units, normally takes 30 units)

## 2019-06-12 NOTE — ED Notes (Signed)
PER [facility] PT HAS TAKEN OUT IV HERSELF AT HOME.

## 2019-06-12 NOTE — ED Triage Notes (Signed)
Ptc/o upper abd pain x 2 days with emesis,denies fever and diarrhea, reports she takes meds for constipation and has a hx of acid reflux

## 2019-06-27 ENCOUNTER — Emergency Department (HOSPITAL_COMMUNITY)
Admission: EM | Admit: 2019-06-27 | Discharge: 2019-06-27 | Disposition: A | Discharge status: Home / Self Care | Payer: Medicaid Other | Source: Home / Self Care | Attending: Emergency Medicine | Admitting: Emergency Medicine

## 2019-06-27 ENCOUNTER — Other Ambulatory Visit: Payer: Self-pay

## 2019-06-27 ENCOUNTER — Encounter (HOSPITAL_COMMUNITY): Payer: Self-pay

## 2019-06-27 ENCOUNTER — Emergency Department (HOSPITAL_COMMUNITY)
Admission: EM | Admit: 2019-06-27 | Discharge: 2019-06-27 | Discharge status: Against Medical Advice | Payer: Medicaid Other | Attending: Emergency Medicine | Admitting: Emergency Medicine

## 2019-06-27 DIAGNOSIS — Z79899 Other long term (current) drug therapy: Secondary | ICD-10-CM | POA: Diagnosis not present

## 2019-06-27 DIAGNOSIS — F17210 Nicotine dependence, cigarettes, uncomplicated: Secondary | ICD-10-CM | POA: Insufficient documentation

## 2019-06-27 DIAGNOSIS — R1084 Generalized abdominal pain: Secondary | ICD-10-CM | POA: Insufficient documentation

## 2019-06-27 DIAGNOSIS — E1065 Type 1 diabetes mellitus with hyperglycemia: Secondary | ICD-10-CM | POA: Insufficient documentation

## 2019-06-27 DIAGNOSIS — R739 Hyperglycemia, unspecified: Secondary | ICD-10-CM

## 2019-06-27 LAB — URINALYSIS, ROUTINE W REFLEX MICROSCOPIC
Bilirubin Urine: NEGATIVE
Glucose, UA: >=500 mg/dL — AB
Ketones, ur: 20 mg/dL — AB
Nitrite: NEGATIVE
Protein, ur: NEGATIVE mg/dL
Specific Gravity, Urine: 1.026 (ref 1.005–1.030)
pH: 6 (ref 5.0–8.0)

## 2019-06-27 LAB — CBC WITH DIFFERENTIAL/PLATELET
Abs Immature Granulocytes: 0.04 10*3/uL (ref 0.00–0.07)
Basophils Absolute: 0.1 10*3/uL (ref 0.0–0.1)
Basophils Relative: 1 %
Eosinophils Absolute: 0.1 10*3/uL (ref 0.0–0.5)
Eosinophils Relative: 1 %
HCT: 44.8 % (ref 36.0–46.0)
Hemoglobin: 15.2 g/dL — ABNORMAL HIGH (ref 12.0–15.0)
Immature Granulocytes: 0 %
Lymphocytes Relative: 25 %
Lymphs Abs: 2.7 10*3/uL (ref 0.7–4.0)
MCH: 29.3 pg (ref 26.0–34.0)
MCHC: 33.9 g/dL (ref 30.0–36.0)
MCV: 86.3 fL (ref 80.0–100.0)
Monocytes Absolute: 0.8 10*3/uL (ref 0.1–1.0)
Monocytes Relative: 8 %
Neutro Abs: 7.1 10*3/uL (ref 1.7–7.7)
Neutrophils Relative %: 65 %
Platelets: 347 10*3/uL (ref 150–400)
RBC: 5.19 MIL/uL — ABNORMAL HIGH (ref 3.87–5.11)
RDW: 12.2 % (ref 11.5–15.5)
WBC: 10.8 10*3/uL — ABNORMAL HIGH (ref 4.0–10.5)
nRBC: 0 % (ref 0.0–0.2)

## 2019-06-27 LAB — BASIC METABOLIC PANEL
Anion gap: 16 — ABNORMAL HIGH (ref 5–15)
BUN: 20 mg/dL (ref 6–20)
CO2: 22 mmol/L (ref 22–32)
Calcium: 10.1 mg/dL (ref 8.9–10.3)
Chloride: 92 mmol/L — ABNORMAL LOW (ref 98–111)
Creatinine, Ser: 1.11 mg/dL — ABNORMAL HIGH (ref 0.44–1.00)
GFR calc Af Amer: >60 mL/min (ref >60)
GFR calc non Af Amer: >60 mL/min (ref >60)
Glucose, Bld: 683 mg/dL (ref 70–99)
Potassium: 4.4 mmol/L (ref 3.5–5.1)
Sodium: 130 mmol/L — ABNORMAL LOW (ref 135–145)

## 2019-06-27 LAB — POCT I-STAT EG7
Bicarbonate: 25.7 mmol/L (ref 20.0–28.0)
Calcium, Ion: 1.23 mmol/L (ref 1.15–1.40)
HCT: 48 % — ABNORMAL HIGH (ref 36.0–46.0)
Hemoglobin: 16.3 g/dL — ABNORMAL HIGH (ref 12.0–15.0)
O2 Saturation: 71 %
Potassium: 4.3 mmol/L (ref 3.5–5.1)
Sodium: 131 mmol/L — ABNORMAL LOW (ref 135–145)
TCO2: 27 mmol/L (ref 22–32)
pCO2, Ven: 44.3 mmHg (ref 44.0–60.0)
pH, Ven: 7.372 (ref 7.250–7.430)
pO2, Ven: 38 mmHg (ref 32.0–45.0)

## 2019-06-27 LAB — CBG MONITORING, ED
Glucose-Capillary: >600 mg/dL (ref 70–99)
Glucose-Capillary: >600 mg/dL (ref 70–99)
Glucose-Capillary: 274 mg/dL — ABNORMAL HIGH (ref 70–99)

## 2019-06-27 LAB — LIPASE, BLOOD: Lipase: 18 U/L (ref 11–51)

## 2019-06-27 LAB — HEPATIC FUNCTION PANEL
ALT: 26 U/L (ref 0–44)
AST: 28 U/L (ref 15–41)
Albumin: 4.1 g/dL (ref 3.5–5.0)
Alkaline Phosphatase: 99 U/L (ref 38–126)
Bilirubin, Direct: 0.2 mg/dL (ref 0.0–0.2)
Indirect Bilirubin: 0.6 mg/dL (ref 0.3–0.9)
Total Bilirubin: 0.8 mg/dL (ref 0.3–1.2)
Total Protein: 8.4 g/dL — ABNORMAL HIGH (ref 6.5–8.1)

## 2019-06-27 LAB — I-STAT BETA HCG BLOOD, ED (MC, WL, AP ONLY): I-stat hCG, quantitative: 5.6 m[IU]/mL — ABNORMAL HIGH (ref <5)

## 2019-06-27 MED ORDER — INSULIN ASPART 100 UNIT/ML ~~LOC~~ SOLN
10.0000 [IU] | Freq: Once | SUBCUTANEOUS | Status: AC
  Administered 2019-06-27: 10 [IU] via SUBCUTANEOUS

## 2019-06-27 MED ORDER — ONDANSETRON 4 MG PO TBDP: 4.0000 mg | ORAL_TABLET | Freq: Three times a day (TID) | ORAL | 0 refills | Status: DC | PRN

## 2019-06-27 MED ORDER — LANTUS SOLOSTAR 100 UNIT/ML ~~LOC~~ SOPN: 30.0000 [IU] | PEN_INJECTOR | Freq: Every day | SUBCUTANEOUS | 1 refills | Status: DC

## 2019-06-27 MED ORDER — ONDANSETRON HCL 4 MG/2ML IJ SOLN
4.0000 mg | Freq: Once | INTRAMUSCULAR | Status: AC
  Administered 2019-06-27: 12:00:00 4 mg via INTRAVENOUS
  Filled 2019-06-27: qty 2

## 2019-06-27 MED ORDER — SODIUM CHLORIDE 0.9 % IV BOLUS
1000.0000 mL | Freq: Once | INTRAVENOUS | Status: AC
  Administered 2019-06-27: 12:00:00 1000 mL via INTRAVENOUS

## 2019-06-27 MED ORDER — LACTATED RINGERS IV BOLUS
1000.0000 mL | Freq: Once | INTRAVENOUS | Status: AC
  Administered 2019-06-27: 1000 mL via INTRAVENOUS

## 2019-06-27 NOTE — ED Triage Notes (Signed)
Per EMS, Pt c/o hyperglycemia after being out of [place] x 3 days and abdominal pain, nausea, and vomiting x this morning.  Pt denies drug use, but seems to be intoxicated on an unknown substance.

## 2019-06-27 NOTE — ED Provider Notes (Signed)
[place] EMERGENCY DEPARTMENT Provider Note   CSN: [id number] Arrival date & time: 06/27/19  1128     History   Chief Complaint Chief Complaint  Patient presents with  . Hyperglycemia    HPI [name] is a 33 y.o. female.     HPI  33 year old female presents with multiple complaints, primarily hyperglycemia.  She has had a broken glucometer for a few days.  Ran out of her Lantus but otherwise reports compliance with her insulin.  Has had vomiting, most recently last night.  Also pain in her abdomen and back.  No cough.  Past Medical History:  Diagnosis Date  . Acid reflux 2010  . Diabetes mellitus   . DKA 05/01/2011  . Polysubstance abuse [facility])     Patient Active Problem List   Diagnosis Date Noted  . Pyelonephritis 03/08/2019  . Abscess   . Systolic murmur   . Hyperglycemia due to type 1 diabetes mellitus ([place]) 10/28/2018  . Substance abuse ([place]) 09/15/2018  . Tobacco abuse 09/15/2018  . Cough 09/15/2018  . Protein-calorie malnutrition, severe ([place]) 09/15/2018  . GERD (gastroesophageal reflux disease) 09/15/2018  . Nausea & vomiting   . Acute lower UTI 07/14/2016  . Panic disorder without agoraphobia with moderate panic attacks 01/20/2016  . Benzodiazepine abuse ([place]) 01/20/2016  . Substance induced mood disorder ([place]) 01/19/2016  . DKA 05/01/2011  . Type 1 diabetes mellitus with other specified complication ([place]) 81/82/9937  . Leukocytosis 05/01/2011    Past Surgical History:  Procedure Laterality Date  . TUBAL LIGATION    . WOUND EXPLORATION Left 07/26/2014   Procedure: Irrigation, debridement and exploration of elbow wounds;  Surgeon: Leanora Cover, MD;  Location: [facility];  Service: Orthopedics;  Laterality: Left;     OB History    Gravida  2   Para  2   Term      Preterm      AB      Living        SAB      TAB      Ectopic      Multiple      Live Births               Home Medications    Prior to  Admission medications   Medication Sig Start Date End Date Taking? Authorizing Provider  acetaminophen (TYLENOL) 500 MG tablet Take 1,500 mg by mouth every 6 (six) hours as needed for mild pain, moderate pain or fever.    Yes [provider]  gabapentin (NEURONTIN) 300 MG capsule Take 600 mg by mouth at bedtime.  08/24/18  Yes [provider]  insulin aspart (NOVOLOG FLEXPEN) 100 UNIT/ML FlexPen Inject 1-7 Units into the skin 3 (three) times daily with meals. Dose based on CBG and carb count   Yes [provider]  linaclotide (LINZESS) 290 MCG CAPS capsule Take 290 mcg by mouth daily before breakfast.   Yes [provider]  omeprazole (PRILOSEC) 40 MG capsule Take 40 mg by mouth daily before breakfast.    Yes [provider]  HYDROcodone-acetaminophen (NORCO/VICODIN) 5-325 MG tablet Take 1 tablet by mouth every 4 (four) hours as needed. Patient not taking: Reported on 06/27/2019 02/28/19   Lily Kocher, PA-C  Insulin Glargine (LANTUS SOLOSTAR) 100 UNIT/ML Solostar Pen Inject 30 Units into the skin daily at 10 pm. 06/27/19 08/26/19  Sherwood Gambler, MD  magic mouthwash w/lidocaine SOLN Take 10 mLs by mouth 4 (four) times  daily as needed for mouth pain. Patient not taking: Reported on 06/27/2019 03/10/19   Orpah CobbMullis, Kiersten P, DO  ondansetron (ZOFRAN ODT) 4 MG disintegrating tablet Take 1 tablet (4 mg total) by mouth every 8 (eight) hours as needed for nausea or vomiting. 06/27/19   Pricilla LovelessGoldston, Judia Arnott, MD  phenol (CHLORASEPTIC) 1.4 % LIQD Use as directed 1 spray in the mouth or throat as needed for throat irritation / pain. Patient not taking: Reported on 06/27/2019 03/10/19   Joana ReamerMullis, Kiersten P, DO    Family History Family History  Problem Relation Age of Onset  . Coronary artery disease Mother   . Lung cancer Mother   . Heart attack Father [id number]  . Thyroid disease Maternal Grandmother   . Diabetes type I Daughter     Social History Social History   Tobacco  Use  . Smoking status: Current Every Day Smoker    Packs/day: 1.00    Years: 15.00    Pack years: 15.00    Types: Cigarettes  . Smokeless tobacco: Never Used  . Tobacco comment: She is currently trying to quit.  Substance Use Topics  . Alcohol use: No  . Drug use: No    Comment: HX of cocaine and marijuana use     Allergies   Codeine, Penicillins, and Tramadol   Review of Systems Review of Systems  Constitutional: Negative for fever.  Respiratory: Negative for cough.   Gastrointestinal: Positive for abdominal pain and vomiting. Negative for diarrhea.  Musculoskeletal: Positive for back pain.  All other systems reviewed and are negative.    Physical Exam Updated Vital Signs BP 117/76 (BP Location: Right Arm)   Pulse 86   Temp 98 F (36.7 C) (Oral)   Resp 14   Ht 5' (1.524 m)   Wt 45.4 kg   SpO2 96%   BMI 19.53 kg/m   Physical Exam Vitals signs and nursing note reviewed.  Constitutional:      Appearance: She is well-developed.  HENT:     Head: Normocephalic and atraumatic.     Right Ear: External ear normal.     Left Ear: External ear normal.     Nose: Nose normal.  Eyes:     General:        Right eye: No discharge.        Left eye: No discharge.  Cardiovascular:     Rate and Rhythm: Normal rate and regular rhythm.     Heart sounds: Normal heart sounds.  Pulmonary:     Effort: Pulmonary effort is normal.     Breath sounds: Normal breath sounds.  Abdominal:     Palpations: Abdomen is soft.     Tenderness: There is abdominal tenderness (generalized).  Skin:    General: Skin is warm and dry.  Neurological:     Mental Status: She is alert.  Psychiatric:        Mood and Affect: Mood is not anxious.      ED Treatments / Results  Labs (all labs ordered are listed, but only abnormal results are displayed) Labs Reviewed  BASIC METABOLIC PANEL - Abnormal; Notable for the following components:      Result Value   Sodium 130 (*)    Chloride 92 (*)     Glucose, Bld 683 (*)    Creatinine, Ser 1.11 (*)    Anion gap 16 (*)    All other components within normal limits  CBC WITH DIFFERENTIAL/PLATELET - Abnormal; Notable for the following components:  WBC 10.8 (*)    RBC 5.19 (*)    Hemoglobin 15.2 (*)    All other components within normal limits  URINALYSIS, ROUTINE W REFLEX MICROSCOPIC - Abnormal; Notable for the following components:   Color, Urine COLORLESS (*)    Glucose, UA >=500 (*)    Hgb urine dipstick MODERATE (*)    Ketones, ur 20 (*)    Leukocytes,Ua TRACE (*)    Bacteria, UA RARE (*)    All other components within normal limits  HEPATIC FUNCTION PANEL - Abnormal; Notable for the following components:   Total Protein 8.4 (*)    All other components within normal limits  CBG MONITORING, ED - Abnormal; Notable for the following components:   Glucose-Capillary >600 (*)    All other components within normal limits  I-STAT BETA HCG BLOOD, ED ([facility], [facility], [facility] ONLY) - Abnormal; Notable for the following components:   I-stat hCG, quantitative 5.6 (*)    All other components within normal limits  POCT I-STAT EG7 - Abnormal; Notable for the following components:   Sodium 131 (*)    HCT 48.0 (*)    Hemoglobin 16.3 (*)    All other components within normal limits  CBG MONITORING, ED - Abnormal; Notable for the following components:   Glucose-Capillary 274 (*)    All other components within normal limits  LIPASE, BLOOD  I-STAT VENOUS BLOOD GAS, ED    EKG None  Radiology No results found.  Procedures Procedures (including critical care time)  Medications Ordered in ED Medications  sodium chloride 0.9 % bolus 1,000 mL (1,000 mLs Intravenous New Bag/Given 06/27/19 1205)  ondansetron (ZOFRAN) injection 4 mg (4 mg Intravenous Given 06/27/19 1208)  lactated ringers bolus 1,000 mL (1,000 mLs Intravenous New Bag/Given 06/27/19 1344)  insulin aspart (novoLOG) injection 10 Units (10 Units Subcutaneous Given 06/27/19 1258)      Initial Impression / Assessment and Plan / ED Course  I have reviewed the triage vital signs and the nursing notes.  Pertinent labs & imaging results that were available during my care of the patient were reviewed by me and considered in my medical decision making (see chart for details).        Patient's vital signs are stable.  She has no urinary symptoms to indicate that her slightly abnormal urinalysis is from UTI.  Has hyperglycemia but her venous blood gas is not indicative of DKA.  Mild elevated anion gap appears to be more from the low chloride rather than true acidosis given her bicarb is over 20.  I do not think she is in DKA.  Has responded to IV fluids and subcutaneous insulin.  No vomiting in the ED.  Appears stable for discharge home and she wants to go home.  Will refill the Lantus.  Final Clinical Impressions(s) / ED Diagnoses   Final diagnoses:  Hyperglycemia    ED Discharge Orders         Ordered    Insulin Glargine (LANTUS SOLOSTAR) 100 UNIT/ML Solostar Pen  Daily at 10 pm     06/27/19 1458    ondansetron (ZOFRAN ODT) 4 MG disintegrating tablet  Every 8 hours PRN     06/27/19 1458           Pricilla Loveless, MD 06/27/19 1513

## 2019-06-27 NOTE — ED Notes (Signed)
Pt noted to be walking out of room, before this writer could finish Triage.  This Probation officer spoke to the Patient outside and she stated she is going to [facility].  Upon further questioning, she is upset about Environmental manager.  Attempted to address concerns and Patient sts "I'm just gonna go to [facility]."

## 2019-06-27 NOTE — ED Triage Notes (Signed)
Patient in for c/o hyperglycemia, was seen as [name] today but left and came to [facility]. C/o of back pain, SOB, N/V (3-4 episodes of emesis). Meter at home read high. Out of [place] for 3 days.

## 2020-04-06 ENCOUNTER — Other Ambulatory Visit: Payer: Self-pay

## 2020-04-06 ENCOUNTER — Emergency Department (HOSPITAL_COMMUNITY): Payer: Medicaid Other

## 2020-04-06 ENCOUNTER — Emergency Department (HOSPITAL_COMMUNITY)
Admission: AD | Admit: 2020-04-06 | Discharge: 2020-04-07 | DRG: 638 | Disposition: A | Discharge status: Home / Self Care | Payer: Medicaid Other | Attending: Family Medicine | Admitting: Family Medicine

## 2020-04-06 DIAGNOSIS — E1100 Type 2 diabetes mellitus with hyperosmolarity without nonketotic hyperglycemic-hyperosmolar coma (NKHHC): Secondary | ICD-10-CM | POA: Diagnosis not present

## 2020-04-06 DIAGNOSIS — K219 Gastro-esophageal reflux disease without esophagitis: Secondary | ICD-10-CM | POA: Diagnosis present

## 2020-04-06 DIAGNOSIS — Z794 Long term (current) use of insulin: Secondary | ICD-10-CM | POA: Diagnosis not present

## 2020-04-06 DIAGNOSIS — F1410 Cocaine abuse, uncomplicated: Secondary | ICD-10-CM | POA: Diagnosis not present

## 2020-04-06 DIAGNOSIS — N390 Urinary tract infection, site not specified: Secondary | ICD-10-CM | POA: Diagnosis not present

## 2020-04-06 DIAGNOSIS — E1010 Type 1 diabetes mellitus with ketoacidosis without coma: Secondary | ICD-10-CM | POA: Diagnosis present

## 2020-04-06 DIAGNOSIS — R1084 Generalized abdominal pain: Secondary | ICD-10-CM | POA: Diagnosis not present

## 2020-04-06 DIAGNOSIS — E1065 Type 1 diabetes mellitus with hyperglycemia: Secondary | ICD-10-CM | POA: Diagnosis not present

## 2020-04-06 DIAGNOSIS — Z833 Family history of diabetes mellitus: Secondary | ICD-10-CM | POA: Diagnosis not present

## 2020-04-06 DIAGNOSIS — Z8249 Family history of ischemic heart disease and other diseases of the circulatory system: Secondary | ICD-10-CM

## 2020-04-06 DIAGNOSIS — Z20822 Contact with and (suspected) exposure to covid-19: Secondary | ICD-10-CM | POA: Diagnosis present

## 2020-04-06 DIAGNOSIS — E1165 Type 2 diabetes mellitus with hyperglycemia: Secondary | ICD-10-CM

## 2020-04-06 DIAGNOSIS — F17210 Nicotine dependence, cigarettes, uncomplicated: Secondary | ICD-10-CM | POA: Diagnosis present

## 2020-04-06 DIAGNOSIS — Z801 Family history of malignant neoplasm of trachea, bronchus and lung: Secondary | ICD-10-CM | POA: Diagnosis not present

## 2020-04-06 DIAGNOSIS — E861 Hypovolemia: Secondary | ICD-10-CM | POA: Diagnosis present

## 2020-04-06 DIAGNOSIS — Z885 Allergy status to narcotic agent status: Secondary | ICD-10-CM | POA: Diagnosis not present

## 2020-04-06 DIAGNOSIS — N179 Acute kidney failure, unspecified: Secondary | ICD-10-CM | POA: Diagnosis not present

## 2020-04-06 DIAGNOSIS — Z880 Allergy status to penicillin: Secondary | ICD-10-CM | POA: Diagnosis not present

## 2020-04-06 DIAGNOSIS — E860 Dehydration: Secondary | ICD-10-CM | POA: Diagnosis not present

## 2020-04-06 LAB — OSMOLALITY: Osmolality: 309 mOsm/kg — ABNORMAL HIGH (ref 275–295)

## 2020-04-06 LAB — I-STAT VENOUS BLOOD GAS, ED
Acid-Base Excess: 11 mmol/L — ABNORMAL HIGH (ref 0.0–2.0)
Bicarbonate: 36.9 mmol/L — ABNORMAL HIGH (ref 20.0–28.0)
Calcium, Ion: 0.97 mmol/L — ABNORMAL LOW (ref 1.15–1.40)
HCT: 41 % (ref 36.0–46.0)
Hemoglobin: 13.9 g/dL (ref 12.0–15.0)
O2 Saturation: 68 %
Potassium: 3.9 mmol/L (ref 3.5–5.1)
Sodium: 131 mmol/L — ABNORMAL LOW (ref 135–145)
TCO2: 38 mmol/L — ABNORMAL HIGH (ref 22–32)
pCO2, Ven: 51 mmHg (ref 44.0–60.0)
pH, Ven: 7.467 — ABNORMAL HIGH (ref 7.250–7.430)
pO2, Ven: 34 mmHg (ref 32.0–45.0)

## 2020-04-06 LAB — CBC WITH DIFFERENTIAL/PLATELET
Abs Immature Granulocytes: 0.06 10*3/uL (ref 0.00–0.07)
Basophils Absolute: 0 10*3/uL (ref 0.0–0.1)
Basophils Relative: 0 %
Eosinophils Absolute: 0 10*3/uL (ref 0.0–0.5)
Eosinophils Relative: 0 %
HCT: 39.9 % (ref 36.0–46.0)
Hemoglobin: 13.1 g/dL (ref 12.0–15.0)
Immature Granulocytes: 0 %
Lymphocytes Relative: 8 %
Lymphs Abs: 1.2 10*3/uL (ref 0.7–4.0)
MCH: 29.2 pg (ref 26.0–34.0)
MCHC: 32.8 g/dL (ref 30.0–36.0)
MCV: 89.1 fL (ref 80.0–100.0)
Monocytes Absolute: 1 10*3/uL (ref 0.1–1.0)
Monocytes Relative: 7 %
Neutro Abs: 12.9 10*3/uL — ABNORMAL HIGH (ref 1.7–7.7)
Neutrophils Relative %: 85 %
Platelets: 367 10*3/uL (ref 150–400)
RBC: 4.48 MIL/uL (ref 3.87–5.11)
RDW: 13 % (ref 11.5–15.5)
WBC: 15.2 10*3/uL — ABNORMAL HIGH (ref 4.0–10.5)
nRBC: 0 % (ref 0.0–0.2)

## 2020-04-06 LAB — I-STAT BETA HCG BLOOD, ED (MC, WL, AP ONLY): I-stat hCG, quantitative: 5.4 m[IU]/mL — ABNORMAL HIGH (ref <5)

## 2020-04-06 LAB — BASIC METABOLIC PANEL
Anion gap: 24 — ABNORMAL HIGH (ref 5–15)
Anion gap: 13 (ref 5–15)
BUN: 31 mg/dL — ABNORMAL HIGH (ref 6–20)
BUN: 25 mg/dL — ABNORMAL HIGH (ref 6–20)
CO2: 30 mmol/L (ref 22–32)
CO2: 32 mmol/L (ref 22–32)
Calcium: 9 mg/dL (ref 8.9–10.3)
Calcium: 8.4 mg/dL — ABNORMAL LOW (ref 8.9–10.3)
Chloride: 80 mmol/L — ABNORMAL LOW (ref 98–111)
Chloride: 93 mmol/L — ABNORMAL LOW (ref 98–111)
Creatinine, Ser: 1.51 mg/dL — ABNORMAL HIGH (ref 0.44–1.00)
Creatinine, Ser: 1.02 mg/dL — ABNORMAL HIGH (ref 0.44–1.00)
GFR calc Af Amer: 51 mL/min — ABNORMAL LOW (ref >60)
GFR calc Af Amer: >60 mL/min (ref >60)
GFR calc non Af Amer: 44 mL/min — ABNORMAL LOW (ref >60)
GFR calc non Af Amer: >60 mL/min (ref >60)
Glucose, Bld: 785 mg/dL (ref 70–99)
Glucose, Bld: 324 mg/dL — ABNORMAL HIGH (ref 70–99)
Potassium: 4.1 mmol/L (ref 3.5–5.1)
Potassium: 3.5 mmol/L (ref 3.5–5.1)
Sodium: 134 mmol/L — ABNORMAL LOW (ref 135–145)
Sodium: 138 mmol/L (ref 135–145)

## 2020-04-06 LAB — URINALYSIS, ROUTINE W REFLEX MICROSCOPIC
Bilirubin Urine: NEGATIVE
Glucose, UA: >=500 mg/dL — AB
Hgb urine dipstick: NEGATIVE
Ketones, ur: 80 mg/dL — AB
Leukocytes,Ua: NEGATIVE
Nitrite: NEGATIVE
Protein, ur: NEGATIVE mg/dL
Specific Gravity, Urine: 1.023 (ref 1.005–1.030)
pH: 6 (ref 5.0–8.0)

## 2020-04-06 LAB — LIPASE, BLOOD: Lipase: 18 U/L (ref 11–51)

## 2020-04-06 LAB — RAPID URINE DRUG SCREEN, HOSP PERFORMED
Amphetamines: NOT DETECTED
Barbiturates: NOT DETECTED
Benzodiazepines: NOT DETECTED
Cocaine: POSITIVE — AB
Opiates: NOT DETECTED
Tetrahydrocannabinol: NOT DETECTED

## 2020-04-06 LAB — CBG MONITORING, ED
Glucose-Capillary: 178 mg/dL — ABNORMAL HIGH (ref 70–99)
Glucose-Capillary: 228 mg/dL — ABNORMAL HIGH (ref 70–99)
Glucose-Capillary: 238 mg/dL — ABNORMAL HIGH (ref 70–99)
Glucose-Capillary: 286 mg/dL — ABNORMAL HIGH (ref 70–99)
Glucose-Capillary: 369 mg/dL — ABNORMAL HIGH (ref 70–99)
Glucose-Capillary: 465 mg/dL — ABNORMAL HIGH (ref 70–99)
Glucose-Capillary: >600 mg/dL (ref 70–99)
Glucose-Capillary: >600 mg/dL (ref 70–99)

## 2020-04-06 LAB — SARS CORONAVIRUS 2 BY RT PCR (HOSPITAL ORDER, PERFORMED IN ~~LOC~~ HOSPITAL LAB): SARS Coronavirus 2: NEGATIVE

## 2020-04-06 LAB — BETA-HYDROXYBUTYRIC ACID: Beta-Hydroxybutyric Acid: 7.41 mmol/L — ABNORMAL HIGH (ref 0.05–0.27)

## 2020-04-06 LAB — HIV ANTIBODY (ROUTINE TESTING W REFLEX): HIV Screen 4th Generation wRfx: NONREACTIVE

## 2020-04-06 MED ORDER — SODIUM CHLORIDE 0.9 % IV SOLN: INTRAVENOUS | Status: DC

## 2020-04-06 MED ORDER — POTASSIUM CHLORIDE 10 MEQ/100ML IV SOLN
10.0000 meq | INTRAVENOUS | Status: AC
  Filled 2020-04-06: qty 100

## 2020-04-06 MED ORDER — POTASSIUM CHLORIDE 10 MEQ/100ML IV SOLN: 10.0000 meq | INTRAVENOUS | Status: DC

## 2020-04-06 MED ORDER — DEXTROSE 50 % IV SOLN
0.0000 mL | INTRAVENOUS | Status: DC | PRN
Start: 2020-04-06 — End: 2020-04-06

## 2020-04-06 MED ORDER — INSULIN REGULAR(HUMAN) IN NACL 100-0.9 UT/100ML-% IV SOLN: INTRAVENOUS | Status: DC

## 2020-04-06 MED ORDER — POTASSIUM CHLORIDE 10 MEQ/100ML IV SOLN
10.0000 meq | INTRAVENOUS | Status: AC
  Administered 2020-04-06: 10 meq via INTRAVENOUS
  Filled 2020-04-06: qty 100

## 2020-04-06 MED ORDER — SODIUM CHLORIDE 0.9 % IV BOLUS
1000.0000 mL | INTRAVENOUS | Status: AC
  Administered 2020-04-06 (×2): 1000 mL via INTRAVENOUS

## 2020-04-06 MED ORDER — ENOXAPARIN SODIUM 30 MG/0.3ML ~~LOC~~ SOLN
30.0000 mg | Freq: Two times a day (BID) | SUBCUTANEOUS | Status: DC
  Administered 2020-04-07: 30 mg via SUBCUTANEOUS
  Filled 2020-04-06 (×2): qty 0.3

## 2020-04-06 MED ORDER — DEXTROSE-NACL 5-0.45 % IV SOLN: INTRAVENOUS | Status: DC

## 2020-04-06 MED ORDER — INSULIN REGULAR(HUMAN) IN NACL 100-0.9 UT/100ML-% IV SOLN
INTRAVENOUS | Status: AC
  Administered 2020-04-06: 4.8 [IU]/h via INTRAVENOUS
  Filled 2020-04-06: qty 100

## 2020-04-06 MED ORDER — FAMOTIDINE IN NACL 20-0.9 MG/50ML-% IV SOLN
20.0000 mg | Freq: Once | INTRAVENOUS | Status: AC
  Administered 2020-04-06: 20 mg via INTRAVENOUS
  Filled 2020-04-06: qty 50

## 2020-04-06 MED ORDER — MORPHINE SULFATE (PF) 4 MG/ML IV SOLN
4.0000 mg | Freq: Once | INTRAVENOUS | Status: AC
  Administered 2020-04-06: 4 mg via INTRAVENOUS
  Filled 2020-04-06: qty 1

## 2020-04-06 MED ORDER — DEXTROSE 50 % IV SOLN: 0.0000 mL | INTRAVENOUS | Status: DC | PRN

## 2020-04-06 MED ORDER — ONDANSETRON HCL 4 MG/2ML IJ SOLN
4.0000 mg | Freq: Once | INTRAMUSCULAR | Status: AC
  Administered 2020-04-06: 4 mg via INTRAVENOUS
  Filled 2020-04-06: qty 2

## 2020-04-06 NOTE — ED Notes (Signed)
SDU [name] [phone] would like to speak to a nurse

## 2020-04-06 NOTE — ED Triage Notes (Signed)
Pt arrived to ED via [facility] with c/o dizziness, n/v, frequent urination,abd pain, & blood sugars in the 400's since Saturday. Pt states that she has recently finished a script of prednisolone d/t bronchitis & was recently switched from latus to tresiba, & also takes 35 units of novolog at night. EMS reports that she was 94/40 upon their arrival as well as A/Ox4. Then they gave her 500 cc bolus & went up to 126/81, pulse 104 bpm, 93% RA. Upon arrival pt was reading "high" on EMS glucometer & the same was read at the facility. Pt remains A/Ox4 upon arrival, verbal- able to make needs known.

## 2020-04-06 NOTE — ED Provider Notes (Signed)
[place] EMERGENCY DEPARTMENT Provider Note   CSN: [id number] Arrival date & time: 04/06/20  1505     History Chief Complaint  Patient presents with  . Hyperglycemia    [name] is a 34 y.o. female.  Pt presents to the ED today with elevated blood sugar and n/v.  Pt has a hx of DM1 and multiple DKA admissions.  She said she's been compliant with her insulin, but just finished a taper of prednisone.  Pt said her blood sugars have high over the weekend.  She's vomited multiple times.          Past Medical History:  Diagnosis Date  . Acid reflux 2010  . Diabetes mellitus   . DKA 05/01/2011  . Polysubstance abuse [facility])     Patient Active Problem List   Diagnosis Date Noted  . Pyelonephritis 03/08/2019  . Abscess   . Systolic murmur   . Hyperglycemia due to type 1 diabetes mellitus ([place]) 10/28/2018  . Substance abuse ([place]) 09/15/2018  . Tobacco abuse 09/15/2018  . Cough 09/15/2018  . Protein-calorie malnutrition, severe ([place]) 09/15/2018  . GERD (gastroesophageal reflux disease) 09/15/2018  . Nausea & vomiting   . Acute lower UTI 07/14/2016  . Panic disorder without agoraphobia with moderate panic attacks 01/20/2016  . Benzodiazepine abuse ([place]) 01/20/2016  . Substance induced mood disorder ([place]) 01/19/2016  . DKA 05/01/2011  . Type 1 diabetes mellitus with other specified complication ([place]) 19/37/9024  . Leukocytosis 05/01/2011    Past Surgical History:  Procedure Laterality Date  . TUBAL LIGATION    . WOUND EXPLORATION Left 07/26/2014   Procedure: Irrigation, debridement and exploration of elbow wounds;  Surgeon: Leanora Cover, MD;  Location: [place];  Service: Orthopedics;  Laterality: Left;     OB History    Gravida  2   Para  2   Term      Preterm      AB      Living        SAB      TAB      Ectopic      Multiple      Live Births              Family History  Problem Relation Age of Onset  . Coronary  artery disease Mother   . Lung cancer Mother   . Heart attack Father 66  . Thyroid disease Maternal Grandmother   . Diabetes type I Daughter     Social History   Tobacco Use  . Smoking status: Current Every Day Smoker    Packs/day: 1.00    Years: 15.00    Pack years: 15.00    Types: Cigarettes  . Smokeless tobacco: Never Used  . Tobacco comment: She is currently trying to quit.  Vaping Use  . Vaping Use: Never used  Substance Use Topics  . Alcohol use: No  . Drug use: No    Comment: HX of cocaine and marijuana use    Home Medications Prior to Admission medications   Medication Sig Start Date End Date Taking? Authorizing Provider  gabapentin (NEURONTIN) 300 MG capsule Take 300 mg by mouth 3 (three) times daily.  08/24/18  Yes [provider]  insulin aspart (NOVOLOG FLEXPEN) 100 UNIT/ML FlexPen Inject 1-15 Units into the skin 3 (three) times daily with meals. Dose based on CBG and carb count, 1 unit for every 15 carb.   Yes [provider]  Insulin Glargine (  LANTUS SOLOSTAR) 100 UNIT/ML Solostar Pen Inject 30 Units into the skin daily at 10 pm. Patient taking differently: Inject 35 Units into the skin daily.  06/27/19 04/06/20 Yes Pricilla Loveless, MD  linaclotide [facility]) 290 MCG CAPS capsule Take 290 mcg by mouth daily as needed (for constipation).    Yes [provider]  omeprazole (PRILOSEC) 40 MG capsule Take 40 mg by mouth daily before breakfast.    Yes [provider]  ondansetron (ZOFRAN ODT) 4 MG disintegrating tablet Take 1 tablet (4 mg total) by mouth every 8 (eight) hours as needed for nausea or vomiting. 06/27/19  Yes Pricilla Loveless, MD  HYDROcodone-acetaminophen (NORCO/VICODIN) 5-325 MG tablet Take 1 tablet by mouth every 4 (four) hours as needed. Patient not taking: Reported on 06/27/2019 02/28/19   Ivery Quale, PA-C  magic mouthwash w/lidocaine SOLN Take 10 mLs by mouth 4 (four) times daily as needed for mouth pain. Patient not  taking: Reported on 06/27/2019 03/10/19   Orpah Cobb P, DO  phenol (CHLORASEPTIC) 1.4 % LIQD Use as directed 1 spray in the mouth or throat as needed for throat irritation / pain. Patient not taking: Reported on 06/27/2019 03/10/19   Orpah Cobb P, DO    Allergies    Codeine, Penicillins, and Tramadol  Review of Systems   Review of Systems  Gastrointestinal: Positive for nausea and vomiting.  Endocrine:       Elevated blood sugar  All other systems reviewed and are negative.   Physical Exam Updated Vital Signs BP (!) 120/92   Pulse (!) 102   Resp 18   SpO2 93%   Physical Exam Vitals and nursing note reviewed.  Constitutional:      Appearance: Normal appearance.  HENT:     Head: Normocephalic and atraumatic.     Right Ear: External ear normal.     Left Ear: External ear normal.     Nose: Nose normal.     Mouth/Throat:     Mouth: Mucous membranes are dry.  Eyes:     Extraocular Movements: Extraocular movements intact.     Conjunctiva/sclera: Conjunctivae normal.     Pupils: Pupils are equal, round, and reactive to light.  Cardiovascular:     Rate and Rhythm: Regular rhythm. Tachycardia present.     Pulses: Normal pulses.     Heart sounds: Normal heart sounds.  Pulmonary:     Effort: Pulmonary effort is normal.     Breath sounds: Normal breath sounds.  Abdominal:     General: Abdomen is flat. Bowel sounds are normal.     Palpations: Abdomen is soft.     Tenderness: There is abdominal tenderness in the epigastric area.  Musculoskeletal:        General: Normal range of motion.     Cervical back: Normal range of motion and neck supple.  Skin:    General: Skin is warm.     Capillary Refill: Capillary refill takes less than 2 seconds.  Neurological:     General: No focal deficit present.     Mental Status: She is alert and oriented to person, place, and time.  Psychiatric:        Mood and Affect: Mood normal.        Behavior: Behavior normal.        Thought  Content: Thought content normal.        Judgment: Judgment normal.     ED Results / Procedures / Treatments   Labs (all labs ordered are  listed, but only abnormal results are displayed) Labs Reviewed  BASIC METABOLIC PANEL - Abnormal; Notable for the following components:      Result Value   Sodium 134 (*)    Chloride 80 (*)    Glucose, Bld 785 (*)    BUN 31 (*)    Creatinine, Ser 1.51 (*)    GFR calc non Af Amer 44 (*)    GFR calc Af Amer 51 (*)    Anion gap 24 (*)    All other components within normal limits  BETA-HYDROXYBUTYRIC ACID - Abnormal; Notable for the following components:   Beta-Hydroxybutyric Acid 7.41 (*)    All other components within normal limits  CBC WITH DIFFERENTIAL/PLATELET - Abnormal; Notable for the following components:   WBC 15.2 (*)    Neutro Abs 12.9 (*)    All other components within normal limits  CBG MONITORING, ED - Abnormal; Notable for the following components:   Glucose-Capillary >600 (*)    All other components within normal limits  I-STAT BETA HCG BLOOD, ED ([facility], [facility], [facility] ONLY) - Abnormal; Notable for the following components:   I-stat hCG, quantitative 5.4 (*)    All other components within normal limits  CBG MONITORING, ED - Abnormal; Notable for the following components:   Glucose-Capillary >600 (*)    All other components within normal limits  I-STAT VENOUS BLOOD GAS, ED - Abnormal; Notable for the following components:   pH, Ven 7.467 (*)    Bicarbonate 36.9 (*)    TCO2 38 (*)    Acid-Base Excess 11.0 (*)    Sodium 131 (*)    Calcium, Ion 0.97 (*)    All other components within normal limits  SARS CORONAVIRUS 2 BY RT PCR (HOSPITAL ORDER, PERFORMED IN [facility])  LIPASE, BLOOD  BASIC METABOLIC PANEL  BASIC METABOLIC PANEL  BASIC METABOLIC PANEL  BETA-HYDROXYBUTYRIC ACID  URINALYSIS, ROUTINE W REFLEX MICROSCOPIC  RAPID URINE DRUG SCREEN, HOSP PERFORMED    EKG EKG Interpretation  Date/Time:  Monday April 06 2020 15:20:42 EDT Ventricular Rate:  102 PR Interval:    QRS Duration: 95 QT Interval:  395 QTC Calculation: 515 R Axis:   78 Text Interpretation: Sinus tachycardia Nonspecific T abnrm, anterolateral leads Prolonged QT interval Since last tracing rate faster Confirmed by Jacalyn Lefevre [phone]) on 04/06/2020 3:55:06 PM   Radiology DG Chest Portable 1 View  Result Date: 04/06/2020 CLINICAL DATA:  Hyperglycemia. EXAM: PORTABLE CHEST 1 VIEW COMPARISON:  August 05, 2019 FINDINGS: There is no evidence of acute infiltrate, pleural effusion or pneumothorax. The heart size and mediastinal contours are within normal limits. The visualized skeletal structures are unremarkable. IMPRESSION: No active disease. Electronically Signed   By: Aram Candela M.D.   On: 04/06/2020 16:02    Procedures Procedures (including critical care time)  Medications Ordered in ED Medications  insulin regular, human (MYXREDLIN) 100 units/ 100 mL infusion (4.8 Units/hr Intravenous New Bag/Given 04/06/20 1631)  0.9 %  sodium chloride infusion ( Intravenous Bolus from Bag 04/06/20 1643)  dextrose 5 %-0.45 % sodium chloride infusion (0 mLs Intravenous Hold 04/06/20 1644)  dextrose 50 % solution 0-50 mL (has no administration in time range)  sodium chloride 0.9 % bolus 1,000 mL (0 mLs Intravenous Stopped 04/06/20 1736)  morphine 4 MG/ML injection 4 mg (has no administration in time range)  ondansetron (ZOFRAN) injection 4 mg (has no administration in time range)  potassium chloride 10 mEq in 100 mL IVPB (has no  administration in time range)  famotidine (PEPCID) IVPB 20 mg premix (20 mg Intravenous Bolus from Bag 04/06/20 1649)    ED Course  I have reviewed the triage vital signs and the nursing notes.  Pertinent labs & imaging results that were available during my care of the patient were reviewed by me and considered in my medical decision making (see chart for details).    MDM Rules/Calculators/A&P                           Pt started on an insulin drip for presumed DKA.  PH is nl, but she does have serum ketones.  She has been vomiting, so she may have self corrected ph from being alkalotic from the vomiting.  Cl is low.   Pt given 2 runs of KCl to normalize K.    She c/o epigastric abd pain, so is given pepcid, mso4, and zofran.  Pt d/w FM residents for admission.  CRITICAL CARE Performed by: Jacalyn Lefevre   Total critical care time: 30 minutes  Critical care time was exclusive of separately billable procedures and treating other patients.  Critical care was necessary to treat or prevent imminent or life-threatening deterioration.  Critical care was time spent personally by me on the following activities: development of treatment plan with patient and/or surrogate as well as nursing, discussions with consultants, evaluation of patient's response to treatment, examination of patient, obtaining history from patient or surrogate, ordering and performing treatments and interventions, ordering and review of laboratory studies, ordering and review of radiographic studies, pulse oximetry and re-evaluation of patient's condition.   Final Clinical Impression(s) / ED Diagnoses Final diagnoses:  Diabetic ketoacidosis without coma associated with type 1 diabetes mellitus (HCC)  Generalized abdominal pain    Rx / DC Orders ED Discharge Orders    None       Jacalyn Lefevre, MD 04/06/20 1801

## 2020-04-06 NOTE — ED Notes (Signed)
Pt was put on O2 due to 88% when sleeping. Pt continues to remove [place]. Repositioned pt and repositioned [place]. At 97% now

## 2020-04-06 NOTE — H&P (Addendum)
Family Medicine Teaching [facility] Admission History and Physical Service Pager: [phone]  Patient name: [name] Medical record number: [id number] Date of birth: [birth year] Age: 34 y.o. Gender: female  Primary Care Provider: Center, [facility] Medical Consultants: None Code Status: Full code Preferred Emergency Contact:   Chief Complaint: Hyperglycemia  Assessment and Plan: [name] is a 34 y.o. female presenting with hyperglycemia with ketonuria. PMH is significant for type 1 diabetes, substance abuse, anxiety.  HHS in the setting of T1DM Patient presented to the emergency department with elevated blood sugars and nausea and vomiting.  She reports being compliant with her insulin but that she just finished a taper of prednisone for COPD exacerbation and that her blood sugars have been high over the weekend.  She has had multiple episodes of vomiting.  Patient reports that her insulin regimen at home is 35 units of Lantus daily and then she carb corrects with 1 unit of short acting insulin for every 15 g of carbs.  On arrival to the ED she was found to have elevated blood glucose 785.  pH was 7.467, PCO2-38, bicarb-36.9, anion gap-24, sodium-134, potassium-4.1, creatinine-1.51, WBC 15.2.  Beta hydroxybutyrate was elevated at 7.41.  Urinalysis was positive for greater than 500 glucose in the urine, 80 ketones.  Given the anion gap, hyperglycemia, and beta hydroxybutyrate level and no acidemia this is most likely HHS rather than DKA. -Admit to inpatient teaching service with Dr. Jennette Kettle as attending -N.p.o. at this time -BMPs every 4 hours -Monitor creatinine and replace as necessary -Beta hydroxybutyrate every 8 hours -IV insulin via Endo tool at this time but will transition to subcu when possible -Maintenance IV fluids with normal saline or D5 half-normal depending on patient's blood glucoses -Continuous cardiac monitoring -Strict I's and O's -Vitals per  routine  Possible UTI Patient reports increased urination over the past few days which she attributes to her elevated blood sugars.  UA was significant for significant glucose, 80 ketones.  The urinalysis also showed many bacteria with 0-5 squamous epithelium and 11-20 WBCs.  Urinalysis was negative for leukocytes or nitrites.  White count was elevated at 15.9 which may be related to the patient's steroid as well as inflammatory response from her HHS. The patient denies any urinary urgency, dysuria, or suprapubic pain.  -Urine culture ordered, follow-up on culture -Holding off on antibiotics at this time but will monitor for dysuria or other signs of UTI.  AKI Creatinine on admission was 1.51.  Patient's baseline is approximately 1.  Most likely due to dehydration and hypovolemia. -Patient is being hydrated with IV fluids at this time, encourage PO hydration once off Insulin drip -BMPs -Avoid nephrotoxic agents  Substance abuse Disorder  Tobacco Use Disorder Patient with longstanding history of substance abuse.  Reports she used cocaine 2 to 3 days ago.  Also reports marijuana use.  Denies any other illicit substance use.  UDS is positive for cocaine. Additionally smokes 2 ppd.  -CIWA's -COWS ordered -Monitor for signs and symptoms of withdrawal -Consult to social work -Nicotine patch  Anxiety and depression Patient with history of anxiety and depression reports that her depression is not doing well at this time.  Her mother and sister recently passed away with her sister overdosing in her house.  Patient is not currently on any antidepressant but we will discuss this while she is hospitalized.  FEN/GI: N.p.o. Prophylaxis: Lovenox  Disposition: Admit to progressive for management of HHS  History of Present Illness:  [name] is  a 34 y.o. female presenting with hyperglycemia.  Patient reports that she has been compliant with her medications but over the last week she has  noticed an increase in her sugars.  She received a steroid Dosepak after being told she has "early stage of COPD".  Patient reports over the past few days she has had increased abdominal pain, nausea, vomiting.  She is also had increased thirst as well as increased urination.  She complains of chronic constipation.  Reports that she has had a total of 4 episodes of DKA but that she does not feel like she is in DKA at this time.  Review Of Systems: Per HPI with the following additions:   Review of Systems  Constitutional: Negative for chills and fever.  HENT: Negative for congestion and rhinorrhea.   Eyes: Negative for visual disturbance.  Respiratory: Positive for cough and wheezing (Chronic from smoking). Negative for shortness of breath.   Cardiovascular: Positive for leg swelling. Negative for chest pain.  Gastrointestinal: Positive for abdominal pain, constipation, nausea and vomiting. Negative for diarrhea.  Endocrine: Positive for polydipsia and polyuria.  Genitourinary: Positive for frequency. Negative for difficulty urinating, dysuria and hematuria.  Musculoskeletal: Negative for neck pain and neck stiffness.  Neurological: Positive for weakness and headaches.  Psychiatric/Behavioral: Positive for dysphoric mood. Negative for self-injury and suicidal ideas.     Patient Active Problem List   Diagnosis Date Noted  . Pyelonephritis 03/08/2019  . Abscess   . Systolic murmur   . Hyperglycemia due to type 1 diabetes mellitus (HCC) 10/28/2018  . Substance abuse (HCC) 09/15/2018  . Tobacco abuse 09/15/2018  . Cough 09/15/2018  . Protein-calorie malnutrition, severe (HCC) 09/15/2018  . GERD (gastroesophageal reflux disease) 09/15/2018  . Nausea & vomiting   . Acute lower UTI 07/14/2016  . Panic disorder without agoraphobia with moderate panic attacks 01/20/2016  . Benzodiazepine abuse (HCC) 01/20/2016  . Substance induced mood disorder (HCC) 01/19/2016  . DKA 05/01/2011  . Type 1  diabetes mellitus with other specified complication (HCC) 05/01/2011  . Leukocytosis 05/01/2011    Past Medical History: Past Medical History:  Diagnosis Date  . Acid reflux 2010  . Diabetes mellitus   . DKA 05/01/2011  . Polysubstance abuse [facility])     Past Surgical History: Past Surgical History:  Procedure Laterality Date  . TUBAL LIGATION    . WOUND EXPLORATION Left 07/26/2014   Procedure: Irrigation, debridement and exploration of elbow wounds;  Surgeon: Betha Loa, MD;  Location: [facility] OR;  Service: Orthopedics;  Laterality: Left;    Social History: Social History   Tobacco Use  . Smoking status: Current Every Day Smoker    Packs/day: 1.00    Years: 15.00    Pack years: 15.00    Types: Cigarettes  . Smokeless tobacco: Never Used  . Tobacco comment: She is currently trying to quit.  Vaping Use  . Vaping Use: Never used  Substance Use Topics  . Alcohol use: No  . Drug use: No    Comment: HX of cocaine and marijuana use   Additional social history:  Please also refer to relevant sections of EMR.  Family History: Family History  Problem Relation Age of Onset  . Coronary artery disease Mother   . Lung cancer Mother   . Heart attack Father 61  . Thyroid disease Maternal Grandmother   . Diabetes type I Daughter      Allergies and Medications: Allergies  Allergen Reactions  . Codeine Anaphylaxis and Nausea  And Vomiting  . Penicillins Anaphylaxis and Nausea And Vomiting    Has patient had a PCN reaction causing immediate rash, facial/tongue/throat swelling, SOB or lightheadedness with hypotension: Yes Has patient had a PCN reaction causing severe rash involving mucus membranes or skin necrosis: Yes Has patient had a PCN reaction that required hospitalization Yes Has patient had a PCN reaction occurring within the last 10 years: No If all of the above answers are "NO", then may proceed with Cephalosporin use.   . Tramadol Itching   No current  facility-administered medications on file prior to encounter.   Current Outpatient Medications on File Prior to Encounter  Medication Sig Dispense Refill  . gabapentin (NEURONTIN) 300 MG capsule Take 300 mg by mouth 3 (three) times daily.   0  . insulin aspart (NOVOLOG FLEXPEN) 100 UNIT/ML FlexPen Inject 1-15 Units into the skin 3 (three) times daily with meals. Dose based on CBG and carb count, 1 unit for every 15 carb.    . Insulin Glargine (LANTUS SOLOSTAR) 100 UNIT/ML Solostar Pen Inject 30 Units into the skin daily at 10 pm. (Patient taking differently: Inject 35 Units into the skin daily. ) 9 mL 1  . linaclotide (LINZESS) 290 MCG CAPS capsule Take 290 mcg by mouth daily as needed (for constipation).     [name] Kitchen omeprazole (PRILOSEC) 40 MG capsule Take 40 mg by mouth daily before breakfast.     . ondansetron (ZOFRAN ODT) 4 MG disintegrating tablet Take 1 tablet (4 mg total) by mouth every 8 (eight) hours as needed for nausea or vomiting. 10 tablet 0  . HYDROcodone-acetaminophen (NORCO/VICODIN) 5-325 MG tablet Take 1 tablet by mouth every 4 (four) hours as needed. (Patient not taking: Reported on 06/27/2019) 12 tablet 0  . magic mouthwash w/lidocaine SOLN Take 10 mLs by mouth 4 (four) times daily as needed for mouth pain. (Patient not taking: Reported on 06/27/2019) 100 mL 0  . phenol (CHLORASEPTIC) 1.4 % LIQD Use as directed 1 spray in the mouth or throat as needed for throat irritation / pain. (Patient not taking: Reported on 06/27/2019)  0    Objective: BP (!) 120/92   Pulse (!) 102   Resp 18   SpO2 93%  Physical Exam Vitals reviewed.  Constitutional:      General: She is not in acute distress. HENT:     Head: Normocephalic and atraumatic.     Nose: No congestion or rhinorrhea.     Mouth/Throat:     Mouth: Mucous membranes are dry.     Pharynx: No oropharyngeal exudate or posterior oropharyngeal erythema.  Eyes:     Extraocular Movements: Extraocular movements intact.      Conjunctiva/sclera: Conjunctivae normal.  Cardiovascular:     Rate and Rhythm: Normal rate and regular rhythm.     Pulses: Normal pulses.     Heart sounds: Normal heart sounds.  Pulmonary:     Effort: Pulmonary effort is normal.     Breath sounds: Wheezing (Expiratory wheeze noted) present.  Abdominal:     General: Abdomen is flat.     Tenderness: There is abdominal tenderness (Mild diffuse tenderness to palpation).  Musculoskeletal:        General: No swelling or tenderness.     Cervical back: Normal range of motion and neck supple. No rigidity or tenderness.  Skin:    General: Skin is warm and dry.  Neurological:     General: No focal deficit present.     Mental Status: She is  alert. Mental status is at baseline.  Psychiatric:     Comments: Patient appears anxious, asks if she can have some water and when Dr. Dareen Piano leaves the room she begins to cry and tells me that she has been depressed because her mother and sister just passed away.  Her sister passed away from an overdose in her house and she has been having a difficult time with that.  Denies any suicidal ideation.  Reports that she uses illicit substances to help her cope.      Labs and Imaging: CBC    Component Value Date/Time   WBC 15.2 (H) 04/06/2020 1519   RBC 4.48 04/06/2020 1519   HGB 13.9 04/06/2020 1528   HCT 41.0 04/06/2020 1528   PLT 367 04/06/2020 1519   MCV 89.1 04/06/2020 1519   MCH 29.2 04/06/2020 1519   MCHC 32.8 04/06/2020 1519   RDW 13.0 04/06/2020 1519   LYMPHSABS 1.2 04/06/2020 1519   MONOABS 1.0 04/06/2020 1519   EOSABS 0.0 04/06/2020 1519   BASOSABS 0.0 04/06/2020 1519   CMP     Component Value Date/Time   NA 138 04/06/2020 1948   K 3.5 04/06/2020 1948   CL 93 (L) 04/06/2020 1948   CO2 32 04/06/2020 1948   GLUCOSE 324 (H) 04/06/2020 1948   BUN 25 (H) 04/06/2020 1948   CREATININE 1.02 (H) 04/06/2020 1948   CALCIUM 8.4 (L) 04/06/2020 1948   PROT 8.4 (H) 06/27/2019 1146   ALBUMIN 4.1  06/27/2019 1146   AST 28 06/27/2019 1146   ALT 26 06/27/2019 1146   ALKPHOS 99 06/27/2019 1146   BILITOT 0.8 06/27/2019 1146   GFRNONAA >60 04/06/2020 1948   GFRAA >60 04/06/2020 1948   Beta hydroxybutyrate 7.41 VBG -pH 7.467 -PCO2 51 -PO2 34 -T CO2-38 -Acid base excess 11 -Bicarbonate 36.9 -Osmolality 309  UA -Greater than 500 glucose -Ketones 80 -Bacteria many -Squamous epithelium 0-5 -WBC 11-20  UDS-cocaine positive  DG Chest Portable 1 View  Result Date: 04/06/2020 CLINICAL DATA:  Hyperglycemia. EXAM: PORTABLE CHEST 1 VIEW COMPARISON:  August 05, 2019 FINDINGS: There is no evidence of acute infiltrate, pleural effusion or pneumothorax. The heart size and mediastinal contours are within normal limits. The visualized skeletal structures are unremarkable. IMPRESSION: No active disease. Electronically Signed   By: Aram Candela M.D.   On: 04/06/2020 16:02     Derrel Nip, MD 04/06/2020, 6:00 PM PGY-1, [facility] Family Medicine FPTS Intern pager: [phone], text pages welcome  FPTS Upper-Level Resident Addendum   I have independently interviewed and examined the patient. I have discussed the above with the original author and agree with their documentation. Please see also any attending notes.    Peggyann Shoals, DO PGY-2, [facility] 04/06/2020 9:50 PM  FPTS Service pager: [phone] (text pages welcome through [facility])

## 2020-04-07 DIAGNOSIS — E1165 Type 2 diabetes mellitus with hyperglycemia: Secondary | ICD-10-CM | POA: Diagnosis not present

## 2020-04-07 DIAGNOSIS — E1100 Type 2 diabetes mellitus with hyperosmolarity without nonketotic hyperglycemic-hyperosmolar coma (NKHHC): Secondary | ICD-10-CM | POA: Diagnosis not present

## 2020-04-07 DIAGNOSIS — R1084 Generalized abdominal pain: Secondary | ICD-10-CM | POA: Diagnosis not present

## 2020-04-07 DIAGNOSIS — F1410 Cocaine abuse, uncomplicated: Secondary | ICD-10-CM | POA: Diagnosis not present

## 2020-04-07 LAB — BASIC METABOLIC PANEL
Anion gap: 6 (ref 5–15)
Anion gap: 7 (ref 5–15)
Anion gap: 10 (ref 5–15)
BUN: 21 mg/dL — ABNORMAL HIGH (ref 6–20)
BUN: 21 mg/dL — ABNORMAL HIGH (ref 6–20)
BUN: 21 mg/dL — ABNORMAL HIGH (ref 6–20)
CO2: 33 mmol/L — ABNORMAL HIGH (ref 22–32)
CO2: 31 mmol/L (ref 22–32)
CO2: 27 mmol/L (ref 22–32)
Calcium: 7.6 mg/dL — ABNORMAL LOW (ref 8.9–10.3)
Calcium: 7.6 mg/dL — ABNORMAL LOW (ref 8.9–10.3)
Calcium: 7.5 mg/dL — ABNORMAL LOW (ref 8.9–10.3)
Chloride: 100 mmol/L (ref 98–111)
Chloride: 99 mmol/L (ref 98–111)
Chloride: 102 mmol/L (ref 98–111)
Creatinine, Ser: 0.79 mg/dL (ref 0.44–1.00)
Creatinine, Ser: 0.77 mg/dL (ref 0.44–1.00)
Creatinine, Ser: 0.83 mg/dL (ref 0.44–1.00)
GFR calc Af Amer: >60 mL/min (ref >60)
GFR calc Af Amer: >60 mL/min (ref >60)
GFR calc Af Amer: >60 mL/min (ref >60)
GFR calc non Af Amer: >60 mL/min (ref >60)
GFR calc non Af Amer: >60 mL/min (ref >60)
GFR calc non Af Amer: >60 mL/min (ref >60)
Glucose, Bld: 204 mg/dL — ABNORMAL HIGH (ref 70–99)
Glucose, Bld: 197 mg/dL — ABNORMAL HIGH (ref 70–99)
Glucose, Bld: 195 mg/dL — ABNORMAL HIGH (ref 70–99)
Potassium: 3.4 mmol/L — ABNORMAL LOW (ref 3.5–5.1)
Potassium: 3.5 mmol/L (ref 3.5–5.1)
Potassium: 3.8 mmol/L (ref 3.5–5.1)
Sodium: 139 mmol/L (ref 135–145)
Sodium: 137 mmol/L (ref 135–145)
Sodium: 139 mmol/L (ref 135–145)

## 2020-04-07 LAB — CBG MONITORING, ED
Glucose-Capillary: 191 mg/dL — ABNORMAL HIGH (ref 70–99)
Glucose-Capillary: 188 mg/dL — ABNORMAL HIGH (ref 70–99)
Glucose-Capillary: 118 mg/dL — ABNORMAL HIGH (ref 70–99)
Glucose-Capillary: 114 mg/dL — ABNORMAL HIGH (ref 70–99)
Glucose-Capillary: 119 mg/dL — ABNORMAL HIGH (ref 70–99)
Glucose-Capillary: 202 mg/dL — ABNORMAL HIGH (ref 70–99)
Glucose-Capillary: 249 mg/dL — ABNORMAL HIGH (ref 70–99)
Glucose-Capillary: 335 mg/dL — ABNORMAL HIGH (ref 70–99)
Glucose-Capillary: 211 mg/dL — ABNORMAL HIGH (ref 70–99)

## 2020-04-07 LAB — HEMOGLOBIN A1C
Hgb A1c MFr Bld: 14.1 % — ABNORMAL HIGH (ref 4.8–5.6)
Mean Plasma Glucose: 358 mg/dL

## 2020-04-07 LAB — BETA-HYDROXYBUTYRIC ACID
Beta-Hydroxybutyric Acid: 0.13 mmol/L (ref 0.05–0.27)
Beta-Hydroxybutyric Acid: 1.4 mmol/L — ABNORMAL HIGH (ref 0.05–0.27)

## 2020-04-07 MED ORDER — INSULIN GLARGINE 100 UNIT/ML ~~LOC~~ SOLN
25.0000 [IU] | SUBCUTANEOUS | Status: DC
  Administered 2020-04-07: 25 [IU] via SUBCUTANEOUS
  Filled 2020-04-07 (×2): qty 0.25

## 2020-04-07 MED ORDER — SENNA 8.6 MG PO TABS
1.0000 | ORAL_TABLET | Freq: Every day | ORAL | Status: DC
  Administered 2020-04-07: 8.6 mg via ORAL
  Filled 2020-04-07: qty 1

## 2020-04-07 MED ORDER — POLYETHYLENE GLYCOL 3350 17 G PO PACK: 17.0000 g | PACK | Freq: Every day | ORAL | 0 refills | Status: DC

## 2020-04-07 MED ORDER — ONDANSETRON 4 MG PO TBDP
4.0000 mg | ORAL_TABLET | Freq: Three times a day (TID) | ORAL | 0 refills | Status: DC | PRN
Start: 2020-04-07 — End: 2020-12-13

## 2020-04-07 MED ORDER — ONDANSETRON HCL 4 MG PO TABS: 4.0000 mg | ORAL_TABLET | Freq: Every day | ORAL | 1 refills | Status: DC | PRN

## 2020-04-07 MED ORDER — ENOXAPARIN SODIUM 30 MG/0.3ML ~~LOC~~ SOLN: 30.0000 mg | SUBCUTANEOUS | Status: DC

## 2020-04-07 MED ORDER — POLYETHYLENE GLYCOL 3350 17 G PO PACK
17.0000 g | PACK | Freq: Every day | ORAL | Status: DC
  Administered 2020-04-07: 17 g via ORAL
  Filled 2020-04-07: qty 1

## 2020-04-07 MED ORDER — FLUCONAZOLE 150 MG PO TABS: 150.0000 mg | ORAL_TABLET | Freq: Once | ORAL | 0 refills | Status: AC

## 2020-04-07 MED ORDER — CEPHALEXIN 500 MG PO CAPS: 500.0000 mg | ORAL_CAPSULE | Freq: Two times a day (BID) | ORAL | 0 refills | Status: AC

## 2020-04-07 MED ORDER — INSULIN ASPART 100 UNIT/ML ~~LOC~~ SOLN
0.0000 [IU] | Freq: Three times a day (TID) | SUBCUTANEOUS | Status: DC
  Administered 2020-04-07: 3 [IU] via SUBCUTANEOUS

## 2020-04-07 MED ORDER — ACETAMINOPHEN 325 MG PO TABS
650.0000 mg | ORAL_TABLET | Freq: Four times a day (QID) | ORAL | Status: DC | PRN
  Administered 2020-04-07: 650 mg via ORAL
  Filled 2020-04-07: qty 2

## 2020-04-07 NOTE — ED Notes (Signed)
Spoke to attending MD and she advised to continue to hold off on K+ until next lab results come back. Currently she is at 3.5 and a gap of 7. Pt states that she feels much better.  Pt also just went to bathroom on her own without notifying this RN of Nt and subsequently we were unable to obtain urine. We advised Pt same

## 2020-04-07 NOTE — Discharge Summary (Signed)
Family Medicine Teaching [facility] Discharge Summary  Patient name: [name] Medical record number: [id number] Date of birth: 05-25-1986 Age: 34 y.o. Gender: female Date of Admission: 04/06/2020  Date of Discharge: 04/08/2020  Admitting Physician: Nestor Ramp, MD  Primary Care Provider: Center, [facility] Medical Consultants: None  Indication for Hospitalization: HHS  Discharge Diagnoses/Problem List:  HHS with elevated anion gap in the setting of type 1 diabetes Increased urination Elevated WBC AKI from dehydration vomiting Chronic substance abuse disorder Tobacco use disorder  Disposition: Home  Discharge Condition: Stable, improved  Discharge Exam:  Pulse Rate:  [70-106] 77 (06/22 0452) Resp:  [9-26] 12 (06/22 0452) BP: (84-122)/(53-98) 91/61 (06/22 0452) SpO2:  [81 %-100 %] 93 % (06/22 0452) Physical Exam: General: Lying in bed comfortably, no acute distress, patient reports mild abdominal tenderness which has improved since admission Cardiovascular: Regular rate and rhythm, no murmurs appreciated Respiratory: Normal work of breathing, lungs clear to auscultation bilaterally Abdomen: Soft, mildly tender to palpation diffusely, positive bowel sounds Extremities: No edema noted, good peripheral pulses  Brief Hospital Course:  [name] is a 34 y.o. female presenting with hyperglycemia with ketonuria. PMH is significant for type 1 diabetes, substance abuse, anxiety.  Below is her hospital course by problem.  HHS in the setting of T1DM Patient presented with elevated blood sugars up to 785 with nausea and vomiting, she also had polyuria.  In the emergency department her VBG showed pH of 7.467, PCO2 38, bicarb 36.9, anion gap 24, sodium 134, potassium 4.1, creatinine 1.51, WBC 15.2.  Her beta hydroxybutyrate was elevated at 7.41.  Patient was started on an insulin drip as well as a 2 bag IV hydration method.  She had every 4 hours BMPs and regular blood sugar  checks.  Her beta hydroxybutyrate was also trended.  On 6/22 patient was transitioned from insulin infusion to her subcu insulin at 25 units Lantus daily with sensitive sliding scale.  She was given a meal and was able to tolerate p.o.  She was discharged home on her current home medication regimen of 35 units of Tresiba daily with carb correction.  AKI On arrival patient's creatinine was found to be 1.51.  She was given IV hydration and had every 4 hours BMPs for her HHS treatment.  Her creatinine was trended to 0.83 on discharge.  Substance abuse disorder Patient with longstanding history of substance abuse and reported that she had used cocaine 2 to 3 days prior to admission.  Also reported occasional marijuana use.  Denies any other illicit substance use.  Her UDS was positive for cocaine on admission.  Social work was consulted.  Issues for Follow Up:  1. Diabetes medication regimen, patient reports she is on [place] at this time but likely Lantus more and would like to go back to Lantus 2. Substance abuse follow-up 3. Depression follow-up 4. Follow-up on urine cultures and bladder pain 5. Patient discharged with antibiotics  Significant Procedures: None  Significant Labs and Imaging:  Recent Labs  Lab 04/06/20 1519 04/06/20 1528  WBC 15.2*  --   HGB 13.1 13.9  HCT 39.9 41.0  PLT 367  --    Recent Labs  Lab 04/06/20 1519 04/06/20 1519 04/06/20 1528 04/06/20 1528 04/06/20 1948 04/06/20 1948 04/07/20 0047 04/07/20 0047 04/07/20 0251 04/07/20 0723  NA 134*   < > 131*  --  138  --  139  --  137 139  K 4.1   < > 3.9   < >  3.5   < > 3.4*   < > 3.5 3.8  CL 80*  --   --   --  93*  --  100  --  99 102  CO2 30  --   --   --  32  --  33*  --  31 27  GLUCOSE 785*  --   --   --  324*  --  204*  --  197* 195*  BUN 31*  --   --   --  25*  --  21*  --  21* 21*  CREATININE 1.51*  --   --   --  1.02*  --  0.79  --  0.77 0.83  CALCIUM 9.0  --   --   --  8.4*  --  7.6*  --  7.6* 7.5*    < > = values in this interval not displayed.   Hemoglobin A1c-14.1  UDS positive for cocaine  UA with greater than 500 glucose, 80 ketones, many bacteria, 0-5 squamous epithelium, 11-20 WBC DG Chest Portable 1 View  Result Date: 04/06/2020 CLINICAL DATA:  Hyperglycemia. EXAM: PORTABLE CHEST 1 VIEW COMPARISON:  August 05, 2019 FINDINGS: There is no evidence of acute infiltrate, pleural effusion or pneumothorax. The heart size and mediastinal contours are within normal limits. The visualized skeletal structures are unremarkable. IMPRESSION: No active disease. Electronically Signed   By: Virgina Norfolk M.D.   On: 04/06/2020 16:02     Results/Tests Pending at Time of Discharge: Urine culture  Discharge Medications:  Allergies as of 04/07/2020      Reactions   Codeine Anaphylaxis, Nausea And Vomiting   Penicillins Rash   As a teenager   Tramadol Itching      Medication List    STOP taking these medications   HYDROcodone-acetaminophen 5-325 MG tablet Commonly known as: NORCO/VICODIN     TAKE these medications   cephALEXin 500 MG capsule Commonly known as: KEFLEX Take 1 capsule (500 mg total) by mouth 2 (two) times daily for 3 days.   gabapentin 300 MG capsule Commonly known as: NEURONTIN Take 300 mg by mouth 3 (three) times daily.   Lantus SoloStar 100 UNIT/ML Solostar Pen Generic drug: insulin glargine Inject 30 Units into the skin daily at 10 pm. What changed:   how much to take  when to take this   Linzess 290 MCG Caps capsule Generic drug: linaclotide Take 290 mcg by mouth daily as needed (for constipation).   magic mouthwash w/lidocaine Soln Take 10 mLs by mouth 4 (four) times daily as needed for mouth pain.   NovoLOG FlexPen 100 UNIT/ML FlexPen Generic drug: insulin aspart Inject 1-15 Units into the skin 3 (three) times daily with meals. Dose based on CBG and carb count, 1 unit for every 15 carb.   omeprazole 40 MG capsule Commonly known as:  PRILOSEC Take 40 mg by mouth daily before breakfast.   ondansetron 4 MG disintegrating tablet Commonly known as: Zofran ODT Take 1 tablet (4 mg total) by mouth every 8 (eight) hours as needed for nausea or vomiting.   phenol 1.4 % Liqd Commonly known as: CHLORASEPTIC Use as directed 1 spray in the mouth or throat as needed for throat irritation / pain.   polyethylene glycol 17 g packet Commonly known as: MIRALAX / GLYCOLAX Take 17 g by mouth daily. Start taking on: April 08, 2020   tiotropium 18 MCG inhalation capsule Commonly known as: SPIRIVA Place 18 mcg into inhaler and  inhale daily.       Discharge Instructions: Please refer to Patient Instructions section of EMR for full details.  Patient was counseled important signs and symptoms that should prompt return to medical care, changes in medications, dietary instructions, activity restrictions, and follow up appointments.   Follow-Up Appointments:   Derrel Nip, MD 04/07/2020, 2:26 PM PGY-1, [facility] Family Medicine

## 2020-04-07 NOTE — ED Notes (Signed)
Lunch tray at bedside. ?

## 2020-04-07 NOTE — Progress Notes (Signed)
Inpatient Diabetes Program Recommendations  AACE/ADA: New Consensus Statement on Inpatient Glycemic Control (2015)  Target Ranges:  Prepandial:   less than 140 mg/dL      Peak postprandial:   less than 180 mg/dL (1-2 hours)      Critically ill patients:  140 - 180 mg/dL   Lab Results  Component Value Date   GLUCAP 249 (H) 04/07/2020   HGBA1C 14.1 (H) 04/06/2020    Review of Glycemic Control Results for [name], [name] (MRN [id number]) as of 04/07/2020 10:38  Ref. Range 04/07/2020 06:12 04/07/2020 07:48 04/07/2020 09:07  Glucose-Capillary Latest Ref Range: 70 - 99 mg/dL 855 (H) 015 (H) 868 (H)   Diabetes history: DM 1 Outpatient Diabetes medications:  Lantus 35 units q HS, Novolog 2- 10 units tid with meals ( does not give unless blood sugars >201 mg/dL) Current orders for Inpatient glycemic control:  Lantus 25 units daily, Novolog sensitive tid with meals   Inpatient Diabetes Program Recommendations:    -Increase Lantus to 30 units daily -Novolog 3 units tid meal coverage if eats 50%  Thank you, Chelsea Hong E. Shambria Camerer, RN, MSN, CDE  Diabetes Coordinator Inpatient Glycemic Control Team Team Pager [phone] (8am-5pm) 04/07/2020 10:42 AM

## 2020-04-07 NOTE — ED Notes (Signed)
Dr. Nobie Putnam at bedside

## 2020-04-07 NOTE — ED Notes (Signed)
Breakfast tray at bedside 

## 2020-04-07 NOTE — Discharge Instructions (Signed)
You were admitted to the hospital for severe dehydration and dangerously elevated blood sugar.  While you were here, we gave you intravenous fluids and started you on an insulin drip.  With time, we were able to take you off of the insulin drip and put you back on your normal insulin injection.  This episode of elevated blood sugars was very dangerous and likely precipitated by her recent use of steroids.  Make sure to start taking your insulin at home as prescribed and follow-up with your primary care provider.  We also discharge you with some antibiotics for UTI as well as fluconazole for a yeast infection.  You can pick it up at your [facility] in [place].

## 2020-04-07 NOTE — ED Notes (Signed)
Lunch Tray Ordered @ 1048. °

## 2020-04-07 NOTE — ED Notes (Signed)
Patient verbalizes understanding of discharge instructions. Opportunity for questioning and answers were provided. Pt discharged from ED. 

## 2020-04-07 NOTE — ED Notes (Signed)
PAGED FM TO RN JOE--Vivian Neuwirth 

## 2020-04-09 LAB — URINE CULTURE: Culture: >=100000 — AB

## 2020-09-02 ENCOUNTER — Other Ambulatory Visit: Payer: Self-pay

## 2020-09-02 ENCOUNTER — Emergency Department (HOSPITAL_COMMUNITY)
Admission: EM | Admit: 2020-09-02 | Discharge: 2020-09-03 | Disposition: A | Discharge status: Home / Self Care | Payer: Medicaid Other | Attending: Emergency Medicine | Admitting: Emergency Medicine

## 2020-09-02 ENCOUNTER — Encounter (HOSPITAL_COMMUNITY): Payer: Self-pay | Admitting: *Deleted

## 2020-09-02 DIAGNOSIS — E1065 Type 1 diabetes mellitus with hyperglycemia: Secondary | ICD-10-CM | POA: Diagnosis not present

## 2020-09-02 DIAGNOSIS — Z794 Long term (current) use of insulin: Secondary | ICD-10-CM | POA: Insufficient documentation

## 2020-09-02 DIAGNOSIS — F17210 Nicotine dependence, cigarettes, uncomplicated: Secondary | ICD-10-CM | POA: Insufficient documentation

## 2020-09-02 DIAGNOSIS — N3000 Acute cystitis without hematuria: Secondary | ICD-10-CM | POA: Insufficient documentation

## 2020-09-02 DIAGNOSIS — S6991XA Unspecified injury of right wrist, hand and finger(s), initial encounter: Secondary | ICD-10-CM | POA: Insufficient documentation

## 2020-09-02 DIAGNOSIS — W268XXA Contact with other sharp object(s), not elsewhere classified, initial encounter: Secondary | ICD-10-CM | POA: Insufficient documentation

## 2020-09-02 DIAGNOSIS — T148XXD Other injury of unspecified body region, subsequent encounter: Secondary | ICD-10-CM

## 2020-09-02 DIAGNOSIS — E1010 Type 1 diabetes mellitus with ketoacidosis without coma: Secondary | ICD-10-CM | POA: Diagnosis not present

## 2020-09-02 NOTE — ED Triage Notes (Signed)
Pt says that she cut her right hand, pinky finger base on barb wire two weeks ago. She says the wound is not healing and it keeps opening up. She is diabetic and her sugars have been 300-400's when taking insulin. Taking aleve for pain.

## 2020-09-03 ENCOUNTER — Emergency Department (HOSPITAL_COMMUNITY): Payer: Medicaid Other

## 2020-09-03 LAB — BASIC METABOLIC PANEL
Anion gap: 8 (ref 5–15)
BUN: 30 mg/dL — ABNORMAL HIGH (ref 6–20)
CO2: 27 mmol/L (ref 22–32)
Calcium: 8.9 mg/dL (ref 8.9–10.3)
Chloride: 99 mmol/L (ref 98–111)
Creatinine, Ser: 0.74 mg/dL (ref 0.44–1.00)
GFR, Estimated: >60 mL/min (ref >60)
Glucose, Bld: 457 mg/dL — ABNORMAL HIGH (ref 70–99)
Potassium: 4.3 mmol/L (ref 3.5–5.1)
Sodium: 134 mmol/L — ABNORMAL LOW (ref 135–145)

## 2020-09-03 LAB — URINALYSIS, ROUTINE W REFLEX MICROSCOPIC
Bilirubin Urine: NEGATIVE
Glucose, UA: >=500 mg/dL — AB
Hgb urine dipstick: NEGATIVE
Ketones, ur: NEGATIVE mg/dL
Nitrite: POSITIVE — AB
Protein, ur: NEGATIVE mg/dL
Specific Gravity, Urine: 1.027 (ref 1.005–1.030)
WBC, UA: >50 WBC/hpf — ABNORMAL HIGH (ref 0–5)
pH: 5 (ref 5.0–8.0)

## 2020-09-03 LAB — CBC
HCT: 37.2 % (ref 36.0–46.0)
Hemoglobin: 11.9 g/dL — ABNORMAL LOW (ref 12.0–15.0)
MCH: 29.2 pg (ref 26.0–34.0)
MCHC: 32 g/dL (ref 30.0–36.0)
MCV: 91.4 fL (ref 80.0–100.0)
Platelets: 300 10*3/uL (ref 150–400)
RBC: 4.07 MIL/uL (ref 3.87–5.11)
RDW: 13.4 % (ref 11.5–15.5)
WBC: 8.1 10*3/uL (ref 4.0–10.5)
nRBC: 0 % (ref 0.0–0.2)

## 2020-09-03 LAB — CBG MONITORING, ED
Glucose-Capillary: 314 mg/dL — ABNORMAL HIGH (ref 70–99)
Glucose-Capillary: 415 mg/dL — ABNORMAL HIGH (ref 70–99)

## 2020-09-03 LAB — I-STAT BETA HCG BLOOD, ED (MC, WL, AP ONLY): I-stat hCG, quantitative: <5 m[IU]/mL (ref <5)

## 2020-09-03 MED ORDER — CEPHALEXIN 250 MG PO CAPS
500.0000 mg | ORAL_CAPSULE | Freq: Once | ORAL | Status: AC
  Administered 2020-09-03: 500 mg via ORAL
  Filled 2020-09-03: qty 2

## 2020-09-03 MED ORDER — CEPHALEXIN 500 MG PO CAPS: 500.0000 mg | ORAL_CAPSULE | Freq: Four times a day (QID) | ORAL | 0 refills | Status: DC

## 2020-09-03 MED ORDER — BACITRACIN ZINC 500 UNIT/GM EX OINT: TOPICAL_OINTMENT | Freq: Two times a day (BID) | CUTANEOUS | Status: DC

## 2020-09-03 NOTE — Progress Notes (Signed)
Orthopedic Tech Progress Note Patient Details:  [name] 1986/09/02 [id number]  Ortho Devices Type of Ortho Device: Finger splint Ortho Device/Splint Location: RUE Ortho Device/Splint Interventions: Ordered, Application   Post Interventions Patient Tolerated: Well Instructions Provided: Care of device   Donald Pore 09/03/2020, 3:49 AM

## 2020-09-03 NOTE — ED Provider Notes (Signed)
[facility] EMERGENCY DEPARTMENT Provider Note   CSN: [id number] Arrival date & time: 09/02/20  2331     History Chief Complaint  Patient presents with  . Hand Pain    [name] is a 34 y.o. female.  Patient with history of DM, polysubstance abuse, acid reflux presents for evaluation of remote right hand injury. She reports that 2 weeks ago she cut her finger on a barbed wire fence and it continues to be open and painful. No nausea, fever. No significant swelling. She reports she has been compliant with her insulin but her blood sugar has been elevated, 300-400's.   The history is provided by the patient. No language interpreter was used.  Hand Pain Pertinent negatives include no chest pain, no abdominal pain and no shortness of breath.       Past Medical History:  Diagnosis Date  . Acid reflux 2010  . Diabetes mellitus   . DKA 05/01/2011  . Polysubstance abuse [facility])     Patient Active Problem List   Diagnosis Date Noted  . Hyperosmolar hyperglycemic state (HHS) (HCC) 04/06/2020  . Generalized abdominal pain   . Cocaine abuse (HCC)   . Pyelonephritis 03/08/2019  . Abscess   . Systolic murmur   . Hyperglycemia due to type 1 diabetes mellitus (HCC) 10/28/2018  . Substance abuse (HCC) 09/15/2018  . Tobacco abuse 09/15/2018  . Cough 09/15/2018  . Protein-calorie malnutrition, severe (HCC) 09/15/2018  . GERD (gastroesophageal reflux disease) 09/15/2018  . Nausea & vomiting   . Acute lower UTI 07/14/2016  . Panic disorder without agoraphobia with moderate panic attacks 01/20/2016  . Benzodiazepine abuse (HCC) 01/20/2016  . Substance induced mood disorder (HCC) 01/19/2016  . DKA 05/01/2011  . Type 1 diabetes mellitus with other specified complication (HCC) 05/01/2011  . Leukocytosis 05/01/2011    Past Surgical History:  Procedure Laterality Date  . TUBAL LIGATION    . WOUND EXPLORATION Left 07/26/2014   Procedure: Irrigation, debridement  and exploration of elbow wounds;  Surgeon: Betha Loa, MD;  Location: [facility] OR;  Service: Orthopedics;  Laterality: Left;     OB History    Gravida  2   Para  2   Term      Preterm      AB      Living        SAB      TAB      Ectopic      Multiple      Live Births              Family History  Problem Relation Age of Onset  . Coronary artery disease Mother   . Lung cancer Mother   . Heart attack Father 25  . Thyroid disease Maternal Grandmother   . Diabetes type I Daughter     Social History   Tobacco Use  . Smoking status: Current Every Day Smoker    Packs/day: 1.00    Years: 15.00    Pack years: 15.00    Types: Cigarettes  . Smokeless tobacco: Never Used  . Tobacco comment: She is currently trying to quit.  Vaping Use  . Vaping Use: Never used  Substance Use Topics  . Alcohol use: No  . Drug use: No    Comment: HX of cocaine and marijuana use    Home Medications Prior to Admission medications   Medication Sig Start Date End Date Taking? Authorizing Provider  cephALEXin (KEFLEX) 500 MG capsule Take  1 capsule (500 mg total) by mouth 4 (four) times daily. 09/03/20   Elpidio Anis, PA-C  gabapentin (NEURONTIN) 300 MG capsule Take 300 mg by mouth 3 (three) times daily.  08/24/18   [provider]  insulin aspart (NOVOLOG FLEXPEN) 100 UNIT/ML FlexPen Inject 1-15 Units into the skin 3 (three) times daily with meals. Dose based on CBG and carb count, 1 unit for every 15 carb.    [provider]  Insulin Glargine (LANTUS SOLOSTAR) 100 UNIT/ML Solostar Pen Inject 30 Units into the skin daily at 10 pm. Patient taking differently: Inject 35 Units into the skin daily.  06/27/19 04/06/20  Pricilla Loveless, MD  linaclotide (LINZESS) 290 MCG CAPS capsule Take 290 mcg by mouth daily as needed (for constipation).     [provider]  magic mouthwash w/lidocaine SOLN Take 10 mLs by mouth 4 (four) times daily as needed for mouth pain. Patient  not taking: Reported on 06/27/2019 03/10/19   Orpah Cobb P, DO  omeprazole (PRILOSEC) 40 MG capsule Take 40 mg by mouth daily before breakfast.     [provider]  ondansetron (ZOFRAN ODT) 4 MG disintegrating tablet Take 1 tablet (4 mg total) by mouth every 8 (eight) hours as needed for nausea or vomiting. 04/07/20   Mirian Mo, MD  ondansetron (ZOFRAN) 4 MG tablet Take 1 tablet (4 mg total) by mouth daily as needed for nausea or vomiting. 04/07/20 04/07/21  Mirian Mo, MD  phenol (CHLORASEPTIC) 1.4 % LIQD Use as directed 1 spray in the mouth or throat as needed for throat irritation / pain. Patient not taking: Reported on 06/27/2019 03/10/19   Orpah Cobb P, DO  polyethylene glycol (MIRALAX / GLYCOLAX) 17 g packet Take 17 g by mouth daily. 04/08/20   Mirian Mo, MD  tiotropium (SPIRIVA) 18 MCG inhalation capsule Place 18 mcg into inhaler and inhale daily.    [provider]    Allergies    Codeine, Penicillins, and Tramadol  Review of Systems   Review of Systems  Constitutional: Negative for fever.  HENT: Negative.   Respiratory: Negative.  Negative for shortness of breath.   Cardiovascular: Negative.  Negative for chest pain.  Gastrointestinal: Negative for abdominal pain and nausea.  Musculoskeletal:       Right 5th finger injury  Skin: Positive for wound. Negative for color change.  Neurological: Negative for light-headedness.    Physical Exam Updated Vital Signs BP 129/77 (BP Location: Left Arm)   Pulse 88   Temp 98 F (36.7 C) (Oral)   Resp 16   SpO2 97%   Physical Exam Vitals and nursing note reviewed.  Constitutional:      Appearance: She is well-developed.  Pulmonary:     Effort: Pulmonary effort is normal.  Musculoskeletal:     Cervical back: Normal range of motion.     Comments: FROM fingers of right hand. There is a fissure to palmar base of finger with thickened skin layering. No open wound, redness, swelling. Cap RF <2s distally.      Skin:    General: Skin is warm and dry.  Neurological:     Mental Status: She is alert and oriented to person, place, and time.     ED Results / Procedures / Treatments   Labs (all labs ordered are listed, but only abnormal results are displayed) Labs Reviewed  BASIC METABOLIC PANEL - Abnormal; Notable for the following components:      Result Value   Sodium 134 (*)  Glucose, Bld 457 (*)    BUN 30 (*)    All other components within normal limits  CBC - Abnormal; Notable for the following components:   Hemoglobin 11.9 (*)    All other components within normal limits  URINALYSIS, ROUTINE W REFLEX MICROSCOPIC - Abnormal; Notable for the following components:   APPearance HAZY (*)    Glucose, UA >=500 (*)    Nitrite POSITIVE (*)    Leukocytes,Ua MODERATE (*)    WBC, UA >50 (*)    Bacteria, UA RARE (*)    All other components within normal limits  CBG MONITORING, ED - Abnormal; Notable for the following components:   Glucose-Capillary 415 (*)    All other components within normal limits  CBG MONITORING, ED - Abnormal; Notable for the following components:   Glucose-Capillary 314 (*)    All other components within normal limits  I-STAT BETA HCG BLOOD, ED ([facility], [facility], [facility] ONLY)    EKG None  Radiology DG Hand Complete Right  Result Date: 09/03/2020 CLINICAL DATA:  Initial evaluation for persistent right hand wound at base of right fifth digit. EXAM: RIGHT HAND - COMPLETE 3+ VIEW COMPARISON:  None. FINDINGS: Mild focal soft tissue swelling about the right fifth MCP joint. No visible soft tissue laceration. No radiopaque foreign body or soft tissue emphysema. No evidence for acute osteomyelitis. No fracture or dislocation. Joint spaces well maintained. IMPRESSION: 1. Mild focal soft tissue swelling about the right fifth MCP joint. No visible soft tissue laceration, radiopaque foreign body, or evidence for acute osteomyelitis. 2. No other acute osseous abnormality about the right  hand. Electronically Signed   By: Rise Mu M.D.   On: 09/03/2020 01:16    Procedures Procedures (including critical care time)  Medications Ordered in ED Medications  bacitracin ointment (has no administration in time range)  cephALEXin (KEFLEX) capsule 500 mg (has no administration in time range)    ED Course  I have reviewed the triage vital signs and the nursing notes.  Pertinent labs & imaging results that were available during my care of the patient were reviewed by me and considered in my medical decision making (see chart for details).    MDM Rules/Calculators/A&P                          Patient to ED with ss/sxs as per HPI.   Hand is healing without evidence of infection. Labs reviewed. No leukocytosis. Imaging c/w soft tissue swelling without osteo. There is evidence of UTI. When questioned, she does report dysuria x 2 weeks. Will treat with Keflex.   Hyperglycemia in the ED of 415. This reduces to 314 without intervention. No sign of DKA.   VSS. She can be discharged home with close PCP follow up. Splint to the finger supplied for comfort.  Final Clinical Impression(s) / ED Diagnoses Final diagnoses:  Delayed wound healing  Acute cystitis without hematuria    Rx / DC Orders ED Discharge Orders         Ordered    cephALEXin (KEFLEX) 500 MG capsule  4 times daily        09/03/20 0252           Elpidio Anis, PA-C 09/03/20 0310    Zadie Rhine, MD 09/03/20 [phone]

## 2020-09-03 NOTE — Discharge Instructions (Addendum)
Follow up with your doctor for recheck in 5-7 days. Take Keflex as directed for infection in your urinary tract. Monitor blood sugars closely.

## 2020-10-25 ENCOUNTER — Emergency Department (HOSPITAL_COMMUNITY)
Admission: EM | Admit: 2020-10-25 | Discharge: 2020-10-25 | Disposition: A | Discharge status: Home / Self Care | Payer: Medicaid Other

## 2020-10-25 ENCOUNTER — Other Ambulatory Visit: Payer: Self-pay

## 2020-11-27 ENCOUNTER — Encounter (HOSPITAL_COMMUNITY): Payer: Self-pay | Admitting: Emergency Medicine

## 2020-11-27 ENCOUNTER — Emergency Department (HOSPITAL_COMMUNITY): Payer: Medicaid Other

## 2020-11-27 ENCOUNTER — Other Ambulatory Visit: Payer: Self-pay

## 2020-11-27 ENCOUNTER — Emergency Department (HOSPITAL_COMMUNITY)
Admission: EM | Admit: 2020-11-27 | Discharge: 2020-11-28 | Disposition: A | Discharge status: Home / Self Care | Payer: Medicaid Other | Attending: Emergency Medicine | Admitting: Emergency Medicine

## 2020-11-27 DIAGNOSIS — K219 Gastro-esophageal reflux disease without esophagitis: Secondary | ICD-10-CM | POA: Insufficient documentation

## 2020-11-27 DIAGNOSIS — R112 Nausea with vomiting, unspecified: Secondary | ICD-10-CM | POA: Diagnosis present

## 2020-11-27 DIAGNOSIS — R1011 Right upper quadrant pain: Secondary | ICD-10-CM | POA: Diagnosis not present

## 2020-11-27 DIAGNOSIS — R109 Unspecified abdominal pain: Secondary | ICD-10-CM

## 2020-11-27 DIAGNOSIS — F17210 Nicotine dependence, cigarettes, uncomplicated: Secondary | ICD-10-CM | POA: Insufficient documentation

## 2020-11-27 DIAGNOSIS — R0602 Shortness of breath: Secondary | ICD-10-CM

## 2020-11-27 DIAGNOSIS — E1110 Type 2 diabetes mellitus with ketoacidosis without coma: Secondary | ICD-10-CM | POA: Diagnosis not present

## 2020-11-27 DIAGNOSIS — J189 Pneumonia, unspecified organism: Secondary | ICD-10-CM | POA: Insufficient documentation

## 2020-11-27 LAB — CBC
HCT: 43 % (ref 36.0–46.0)
Hemoglobin: 14.4 g/dL (ref 12.0–15.0)
MCH: 29.4 pg (ref 26.0–34.0)
MCHC: 33.5 g/dL (ref 30.0–36.0)
MCV: 87.8 fL (ref 80.0–100.0)
Platelets: 273 10*3/uL (ref 150–400)
RBC: 4.9 MIL/uL (ref 3.87–5.11)
RDW: 14.7 % (ref 11.5–15.5)
WBC: 21.1 10*3/uL — ABNORMAL HIGH (ref 4.0–10.5)
nRBC: 0 % (ref 0.0–0.2)

## 2020-11-27 LAB — COMPREHENSIVE METABOLIC PANEL
ALT: 130 U/L — ABNORMAL HIGH (ref 0–44)
AST: 100 U/L — ABNORMAL HIGH (ref 15–41)
Albumin: 3.1 g/dL — ABNORMAL LOW (ref 3.5–5.0)
Alkaline Phosphatase: 139 U/L — ABNORMAL HIGH (ref 38–126)
Anion gap: 13 (ref 5–15)
BUN: 33 mg/dL — ABNORMAL HIGH (ref 6–20)
CO2: 23 mmol/L (ref 22–32)
Calcium: 7.3 mg/dL — ABNORMAL LOW (ref 8.9–10.3)
Chloride: 93 mmol/L — ABNORMAL LOW (ref 98–111)
Creatinine, Ser: 1.11 mg/dL — ABNORMAL HIGH (ref 0.44–1.00)
GFR, Estimated: >60 mL/min (ref >60)
Glucose, Bld: 554 mg/dL (ref 70–99)
Potassium: 3.5 mmol/L (ref 3.5–5.1)
Sodium: 129 mmol/L — ABNORMAL LOW (ref 135–145)
Total Bilirubin: 0.8 mg/dL (ref 0.3–1.2)
Total Protein: 7.2 g/dL (ref 6.5–8.1)

## 2020-11-27 LAB — POC URINE PREG, ED: Preg Test, Ur: NEGATIVE

## 2020-11-27 LAB — LIPASE, BLOOD: Lipase: 18 U/L (ref 11–51)

## 2020-11-27 MED ORDER — IOHEXOL 300 MG/ML  SOLN
100.0000 mL | Freq: Once | INTRAMUSCULAR | Status: AC | PRN
  Administered 2020-11-28: 100 mL via INTRAVENOUS

## 2020-11-27 MED ORDER — ONDANSETRON HCL 4 MG/2ML IJ SOLN
4.0000 mg | Freq: Once | INTRAMUSCULAR | Status: AC
  Administered 2020-11-27: 4 mg via INTRAVENOUS
  Filled 2020-11-27: qty 2

## 2020-11-27 MED ORDER — SODIUM CHLORIDE 0.9 % IV BOLUS
1000.0000 mL | Freq: Once | INTRAVENOUS | Status: AC
  Administered 2020-11-27: 1000 mL via INTRAVENOUS

## 2020-11-27 MED ORDER — FENTANYL CITRATE (PF) 100 MCG/2ML IJ SOLN
50.0000 ug | Freq: Once | INTRAMUSCULAR | Status: AC
  Administered 2020-11-27: 50 ug via INTRAVENOUS
  Filled 2020-11-27: qty 2

## 2020-11-27 NOTE — ED Provider Notes (Signed)
[facility]EM[facility]Y DEPT [facility] Emergency Department Provider Note MRN:  [id number]  Arrival date & time: 11/28/20     Chief Complaint   Emesis   History of Present Illness   [name] is a 35 y.o. year-old female with a history of substance abuse, diabetes presenting to the ED with chief complaint of emesis.  2 or 3 days of nausea, vomiting, chills, fever, right-sided abdominal pain.  Pain is constant, severe.  Denies burning with urination, no cough, no chest pain or shortness of breath.  No exacerbating or alleviating factors.  Review of Systems  A complete 10 system review of systems was obtained and all systems are negative except as noted in the HPI and PMH.   Patient's Health History    Past Medical History:  Diagnosis Date  . Acid reflux 2010  . Diabetes mellitus   . DKA 05/01/2011  . Polysubstance abuse [facility])     Past Surgical History:  Procedure Laterality Date  . TUBAL LIGATION    . WOUND EXPLORATION Left 07/26/2014   Procedure: Irrigation, debridement and exploration of elbow wounds;  Surgeon: Betha Loa, MD;  Location: [facility] OR;  Service: Orthopedics;  Laterality: Left;    Family History  Problem Relation Age of Onset  . Coronary artery disease Mother   . Lung cancer Mother   . Heart attack Father 51  . Thyroid disease Maternal Grandmother   . Diabetes type I Daughter     Social History   Socioeconomic History  . Marital status: Single    Spouse name: Not on file  . Number of children: Not on file  . Years of education: Not on file  . Highest education level: Not on file  Occupational History  . Not on file  Tobacco Use  . Smoking status: Current Every Day Smoker    Packs/day: 1.00    Years: 15.00    Pack years: 15.00    Types: Cigarettes  . Smokeless tobacco: Never Used  . Tobacco comment: She is currently trying to quit.  Vaping Use  . Vaping Use: Never used  Substance and Sexual Activity  . Alcohol use: No  . Drug use: No     Comment: HX of cocaine and marijuana use  . Sexual activity: Yes    Birth control/protection: Surgical  Other Topics Concern  . Not on file  Social History Narrative   Currently does not have insurance.   Social Determinants of Health   Financial Resource Strain: Not on file  Food Insecurity: Not on file  Transportation Needs: Not on file  Physical Activity: Not on file  Stress: Not on file  Social Connections: Not on file  Intimate Partner Violence: Not on file     Physical Exam   Vitals:   11/28/20 0030 11/28/20 0145  BP: 92/70 (!) 138/97  Pulse: 75 86  Resp: (!) 28 (!) 21  Temp:    SpO2: 96% 99%    CONSTITUTIONAL: Chronically ill-appearing, NAD NEURO:  Alert and oriented x 3, no focal deficits EYES:  eyes equal and reactive ENT/NECK:  no LAD, no JVD CARDIO: Regular rate, well-perfused, normal S1 and S2 PULM:  CTAB no wheezing or rhonchi GI/GU:  normal bowel sounds, non-distended, non-tender MSK/SPINE:  No gross deformities, no edema SKIN:  no rash, atraumatic PSYCH:  Appropriate speech and behavior  *Additional and/or pertinent findings included in MDM below  Diagnostic and Interventional Summary    EKG Interpretation  Date/Time:    Ventricular Rate:  PR Interval:    QRS Duration:   QT Interval:    QTC Calculation:   R Axis:     Text Interpretation:        Labs Reviewed  COMPREHENSIVE METABOLIC PANEL - Abnormal; Notable for the following components:      Result Value   Sodium 129 (*)    Chloride 93 (*)    Glucose, Bld 554 (*)    BUN 33 (*)    Creatinine, Ser 1.11 (*)    Calcium 7.3 (*)    Albumin 3.1 (*)    AST 100 (*)    ALT 130 (*)    Alkaline Phosphatase 139 (*)    All other components within normal limits  CBC - Abnormal; Notable for the following components:   WBC 21.1 (*)    All other components within normal limits  URINALYSIS, ROUTINE W REFLEX MICROSCOPIC - Abnormal; Notable for the following components:   APPearance CLOUDY (*)     Glucose, UA >=500 (*)    Hgb urine dipstick MODERATE (*)    Protein, ur 30 (*)    Leukocytes,Ua MODERATE (*)    WBC, UA >50 (*)    Bacteria, UA MANY (*)    All other components within normal limits  LIPASE, BLOOD  PREGNANCY, URINE  POC URINE PREG, ED    CT ABDOMEN PELVIS W CONTRAST  Final Result    DG Chest Port 1 View  Final Result      Medications  cefTRIAXone (ROCEPHIN) 2 g in sodium chloride 0.9 % 100 mL IVPB (has no administration in time range)  azithromycin (ZITHROMAX) 500 mg in sodium chloride 0.9 % 250 mL IVPB (has no administration in time range)  sodium chloride 0.9 % bolus 1,000 mL (0 mLs Intravenous Stopped 11/28/20 0058)  ondansetron (ZOFRAN) injection 4 mg (4 mg Intravenous Given 11/27/20 2347)  fentaNYL (SUBLIMAZE) injection 50 mcg (50 mcg Intravenous Given 11/27/20 2347)  iohexol (OMNIPAQUE) 300 MG/ML solution 100 mL (100 mLs Intravenous Contrast Given 11/28/20 0051)  sodium chloride 0.9 % bolus 1,000 mL (1,000 mLs Intravenous New Bag/Given 11/28/20 0058)  insulin aspart (novoLOG) injection 4 Units (4 Units Intravenous Given 11/28/20 0059)  ketorolac (TORADOL) 15 MG/ML injection 15 mg (15 mg Intravenous Given 11/28/20 0111)     Procedures  /  Critical Care Procedures  ED Course and Medical Decision Making  I have reviewed the triage vital signs, the nursing notes, and pertinent available records from the EMR.  Listed above are laboratory and imaging tests that I personally ordered, reviewed, and interpreted and then considered in my medical decision making (see below for details).  Patient endorsing fever, pain is mostly in the right upper quadrant as well as the right flank.  Significant leukocytosis, hyperglycemia but no evidence of DKA.  Endorsing nausea vomiting and diarrhea, considering simple gastroenteritis but will obtain CT imaging to exclude cholecystitis or intra-abdominal abscess.     CT imaging overall without intra-abdominal process.  There is  evidence of diffuse possibly bacterial pneumonia on CT and x-ray.  Urinalysis showing evidence of infection as well.  Patient had some documented soft blood pressures however not far from her baseline and on my reevaluation her blood pressure is 120/70.  She is well-appearing in no acute distress, no shortness of breath.  No indication for admission or further testing, appropriate for discharge on antibiotics.  History of drug abuse but denies any recent IV drug use, none for a few years.  Elmer Sow. Thorn Demas,  MD [facility] Emergency [facility] [email or web address]  Final Clinical Impressions(s) / ED Diagnoses     ICD-10-CM   1. Community acquired pneumonia, unspecified laterality  J18.9   2. Flank pain  R10.9 DG Chest Port 1 View    CANCELED: DG Chest 2 View    CANCELED: DG Chest 2 View  3. SOB (shortness of breath)  R06.02 DG Chest Port 1 View    ED Discharge Orders         Ordered    cefdinir (OMNICEF) 300 MG capsule  2 times daily        11/28/20 0151    azithromycin (ZITHROMAX) 250 MG tablet        11/28/20 0151    naproxen (NAPROSYN) 500 MG tablet  2 times daily        11/28/20 0151           Discharge Instructions Discussed with and Provided to Patient:     Discharge Instructions     You were evaluated in the Emergency Department and after careful evaluation, we did not find any emergent condition requiring admission or further testing in the hospital.  Your exam/testing today was overall reassuring.  Your symptoms seem to be due to pneumonia.  You may also have a urinary tract infection.  Please take the antibiotics as directed.  Use the Naprosyn medication as needed for pain  Please return to the Emergency Department if you experience any worsening of your condition.  Thank you for allowing [place] to be a part of your care.        Sabas Sous, MD 11/28/20 [phone]

## 2020-11-27 NOTE — ED Notes (Signed)
Date and time results received: 11/27/20 2054  Test: glucose Critical Value: 554  Name of Provider Notified: Dr. Estell Harpin  Orders Received? Or Actions Taken?: n/a

## 2020-11-27 NOTE — ED Triage Notes (Signed)
Pt c/o N/V, fever and chills x 1 week.

## 2020-11-28 LAB — URINALYSIS, ROUTINE W REFLEX MICROSCOPIC
Bilirubin Urine: NEGATIVE
Glucose, UA: >=500 mg/dL — AB
Ketones, ur: NEGATIVE mg/dL
Nitrite: NEGATIVE
Protein, ur: 30 mg/dL — AB
Specific Gravity, Urine: 1.026 (ref 1.005–1.030)
WBC, UA: >50 WBC/hpf — ABNORMAL HIGH (ref 0–5)
pH: 5 (ref 5.0–8.0)

## 2020-11-28 LAB — PREGNANCY, URINE: Preg Test, Ur: NEGATIVE

## 2020-11-28 MED ORDER — AZITHROMYCIN 250 MG PO TABS: ORAL_TABLET | ORAL | 0 refills | Status: DC

## 2020-11-28 MED ORDER — SODIUM CHLORIDE 0.9 % IV SOLN
500.0000 mg | INTRAVENOUS | Status: DC
  Administered 2020-11-28: 500 mg via INTRAVENOUS
  Filled 2020-11-28: qty 500

## 2020-11-28 MED ORDER — INSULIN ASPART 100 UNIT/ML IV SOLN
4.0000 [IU] | Freq: Once | INTRAVENOUS | Status: AC
  Administered 2020-11-28: 4 [IU] via INTRAVENOUS

## 2020-11-28 MED ORDER — SODIUM CHLORIDE 0.9 % IV BOLUS
1000.0000 mL | Freq: Once | INTRAVENOUS | Status: AC
  Administered 2020-11-28: 1000 mL via INTRAVENOUS

## 2020-11-28 MED ORDER — NAPROXEN 500 MG PO TABS: 500.0000 mg | ORAL_TABLET | Freq: Two times a day (BID) | ORAL | 0 refills | Status: DC

## 2020-11-28 MED ORDER — CEFDINIR 300 MG PO CAPS: 300.0000 mg | ORAL_CAPSULE | Freq: Two times a day (BID) | ORAL | 0 refills | Status: DC

## 2020-11-28 MED ORDER — KETOROLAC TROMETHAMINE 15 MG/ML IJ SOLN
15.0000 mg | Freq: Once | INTRAMUSCULAR | Status: AC
  Administered 2020-11-28: 15 mg via INTRAVENOUS
  Filled 2020-11-28: qty 1

## 2020-11-28 MED ORDER — SODIUM CHLORIDE 0.9 % IV SOLN
2.0000 g | Freq: Once | INTRAVENOUS | Status: AC
  Administered 2020-11-28: 2 g via INTRAVENOUS
  Filled 2020-11-28: qty 20

## 2020-11-28 NOTE — ED Notes (Signed)
Patient transported to CT via stretcher accompanied by Radiology staff.  

## 2020-11-28 NOTE — Discharge Instructions (Addendum)
You were evaluated in the Emergency Department and after careful evaluation, we did not find any emergent condition requiring admission or further testing in the hospital.  Your exam/testing today was overall reassuring.  Your symptoms seem to be due to pneumonia.  You may also have a urinary tract infection.  Please take the antibiotics as directed.  Use the Naprosyn medication as needed for pain  Please return to the Emergency Department if you experience any worsening of your condition.  Thank you for allowing [place] to be a part of your care.

## 2020-11-30 ENCOUNTER — Inpatient Hospital Stay (HOSPITAL_COMMUNITY): Payer: Medicaid Other

## 2020-11-30 ENCOUNTER — Inpatient Hospital Stay (HOSPITAL_COMMUNITY)
Admission: EM | Admit: 2020-11-30 | Discharge: 2020-12-13 | DRG: 871 | Discharge status: Against Medical Advice | Payer: Medicaid Other | Attending: Internal Medicine | Admitting: Internal Medicine

## 2020-11-30 ENCOUNTER — Encounter (HOSPITAL_COMMUNITY): Payer: Self-pay

## 2020-11-30 ENCOUNTER — Other Ambulatory Visit: Payer: Self-pay

## 2020-11-30 ENCOUNTER — Emergency Department (HOSPITAL_COMMUNITY): Payer: Medicaid Other

## 2020-11-30 DIAGNOSIS — A4102 Sepsis due to Methicillin resistant Staphylococcus aureus: Principal | ICD-10-CM | POA: Diagnosis present

## 2020-11-30 DIAGNOSIS — J189 Pneumonia, unspecified organism: Secondary | ICD-10-CM | POA: Diagnosis present

## 2020-11-30 DIAGNOSIS — E109 Type 1 diabetes mellitus without complications: Secondary | ICD-10-CM | POA: Diagnosis present

## 2020-11-30 DIAGNOSIS — Z452 Encounter for adjustment and management of vascular access device: Secondary | ICD-10-CM

## 2020-11-30 DIAGNOSIS — R531 Weakness: Secondary | ICD-10-CM | POA: Diagnosis present

## 2020-11-30 DIAGNOSIS — E875 Hyperkalemia: Secondary | ICD-10-CM | POA: Diagnosis not present

## 2020-11-30 DIAGNOSIS — J439 Emphysema, unspecified: Secondary | ICD-10-CM | POA: Diagnosis present

## 2020-11-30 DIAGNOSIS — E874 Mixed disorder of acid-base balance: Secondary | ICD-10-CM | POA: Diagnosis not present

## 2020-11-30 DIAGNOSIS — J15212 Pneumonia due to Methicillin resistant Staphylococcus aureus: Secondary | ICD-10-CM | POA: Diagnosis present

## 2020-11-30 DIAGNOSIS — Z720 Tobacco use: Secondary | ICD-10-CM | POA: Diagnosis present

## 2020-11-30 DIAGNOSIS — R6520 Severe sepsis without septic shock: Secondary | ICD-10-CM

## 2020-11-30 DIAGNOSIS — F1310 Sedative, hypnotic or anxiolytic abuse, uncomplicated: Secondary | ICD-10-CM | POA: Diagnosis present

## 2020-11-30 DIAGNOSIS — Z794 Long term (current) use of insulin: Secondary | ICD-10-CM

## 2020-11-30 DIAGNOSIS — J9601 Acute respiratory failure with hypoxia: Secondary | ICD-10-CM

## 2020-11-30 DIAGNOSIS — A419 Sepsis, unspecified organism: Secondary | ICD-10-CM | POA: Diagnosis not present

## 2020-11-30 DIAGNOSIS — R0689 Other abnormalities of breathing: Secondary | ICD-10-CM

## 2020-11-30 DIAGNOSIS — Z5329 Procedure and treatment not carried out because of patient's decision for other reasons: Secondary | ICD-10-CM | POA: Diagnosis present

## 2020-11-30 DIAGNOSIS — D696 Thrombocytopenia, unspecified: Secondary | ICD-10-CM | POA: Diagnosis not present

## 2020-11-30 DIAGNOSIS — E1010 Type 1 diabetes mellitus with ketoacidosis without coma: Secondary | ICD-10-CM | POA: Diagnosis present

## 2020-11-30 DIAGNOSIS — I361 Nonrheumatic tricuspid (valve) insufficiency: Secondary | ICD-10-CM | POA: Diagnosis not present

## 2020-11-30 DIAGNOSIS — I38 Endocarditis, valve unspecified: Secondary | ICD-10-CM | POA: Diagnosis not present

## 2020-11-30 DIAGNOSIS — F1994 Other psychoactive substance use, unspecified with psychoactive substance-induced mood disorder: Secondary | ICD-10-CM | POA: Diagnosis present

## 2020-11-30 DIAGNOSIS — N179 Acute kidney failure, unspecified: Secondary | ICD-10-CM | POA: Diagnosis present

## 2020-11-30 DIAGNOSIS — F1410 Cocaine abuse, uncomplicated: Secondary | ICD-10-CM | POA: Diagnosis present

## 2020-11-30 DIAGNOSIS — K219 Gastro-esophageal reflux disease without esophagitis: Secondary | ICD-10-CM | POA: Diagnosis present

## 2020-11-30 DIAGNOSIS — Z79899 Other long term (current) drug therapy: Secondary | ICD-10-CM

## 2020-11-30 DIAGNOSIS — F17210 Nicotine dependence, cigarettes, uncomplicated: Secondary | ICD-10-CM | POA: Diagnosis present

## 2020-11-30 DIAGNOSIS — R7989 Other specified abnormal findings of blood chemistry: Secondary | ICD-10-CM

## 2020-11-30 DIAGNOSIS — R7401 Elevation of levels of liver transaminase levels: Secondary | ICD-10-CM

## 2020-11-30 DIAGNOSIS — R0600 Dyspnea, unspecified: Secondary | ICD-10-CM

## 2020-11-30 DIAGNOSIS — Z781 Physical restraint status: Secondary | ICD-10-CM

## 2020-11-30 DIAGNOSIS — E876 Hypokalemia: Secondary | ICD-10-CM | POA: Diagnosis present

## 2020-11-30 DIAGNOSIS — U071 COVID-19: Secondary | ICD-10-CM | POA: Diagnosis present

## 2020-11-30 DIAGNOSIS — J9600 Acute respiratory failure, unspecified whether with hypoxia or hypercapnia: Secondary | ICD-10-CM | POA: Diagnosis not present

## 2020-11-30 DIAGNOSIS — G9341 Metabolic encephalopathy: Secondary | ICD-10-CM | POA: Diagnosis not present

## 2020-11-30 DIAGNOSIS — Z8249 Family history of ischemic heart disease and other diseases of the circulatory system: Secondary | ICD-10-CM

## 2020-11-30 DIAGNOSIS — K760 Fatty (change of) liver, not elsewhere classified: Secondary | ICD-10-CM | POA: Diagnosis present

## 2020-11-30 DIAGNOSIS — J80 Acute respiratory distress syndrome: Secondary | ICD-10-CM | POA: Diagnosis present

## 2020-11-30 DIAGNOSIS — D649 Anemia, unspecified: Secondary | ICD-10-CM | POA: Diagnosis not present

## 2020-11-30 DIAGNOSIS — J1282 Pneumonia due to coronavirus disease 2019: Secondary | ICD-10-CM | POA: Diagnosis present

## 2020-11-30 DIAGNOSIS — I340 Nonrheumatic mitral (valve) insufficiency: Secondary | ICD-10-CM | POA: Diagnosis not present

## 2020-11-30 DIAGNOSIS — J984 Other disorders of lung: Secondary | ICD-10-CM | POA: Diagnosis not present

## 2020-11-30 DIAGNOSIS — Z885 Allergy status to narcotic agent status: Secondary | ICD-10-CM

## 2020-11-30 DIAGNOSIS — J9602 Acute respiratory failure with hypercapnia: Secondary | ICD-10-CM | POA: Diagnosis not present

## 2020-11-30 DIAGNOSIS — Z801 Family history of malignant neoplasm of trachea, bronchus and lung: Secondary | ICD-10-CM

## 2020-11-30 DIAGNOSIS — R9431 Abnormal electrocardiogram [ECG] [EKG]: Secondary | ICD-10-CM

## 2020-11-30 DIAGNOSIS — Z978 Presence of other specified devices: Secondary | ICD-10-CM

## 2020-11-30 DIAGNOSIS — R6521 Severe sepsis with septic shock: Secondary | ICD-10-CM | POA: Diagnosis present

## 2020-11-30 DIAGNOSIS — Z880 Allergy status to penicillin: Secondary | ICD-10-CM

## 2020-11-30 DIAGNOSIS — E871 Hypo-osmolality and hyponatremia: Secondary | ICD-10-CM | POA: Diagnosis present

## 2020-11-30 DIAGNOSIS — J918 Pleural effusion in other conditions classified elsewhere: Secondary | ICD-10-CM | POA: Diagnosis not present

## 2020-11-30 DIAGNOSIS — Z833 Family history of diabetes mellitus: Secondary | ICD-10-CM

## 2020-11-30 DIAGNOSIS — J869 Pyothorax without fistula: Secondary | ICD-10-CM | POA: Diagnosis present

## 2020-11-30 DIAGNOSIS — I5021 Acute systolic (congestive) heart failure: Secondary | ICD-10-CM | POA: Diagnosis present

## 2020-11-30 DIAGNOSIS — F1910 Other psychoactive substance abuse, uncomplicated: Secondary | ICD-10-CM | POA: Diagnosis present

## 2020-11-30 LAB — BETA-HYDROXYBUTYRIC ACID
Beta-Hydroxybutyric Acid: >8 mmol/L — ABNORMAL HIGH (ref 0.05–0.27)
Beta-Hydroxybutyric Acid: 4.23 mmol/L — ABNORMAL HIGH (ref 0.05–0.27)
Beta-Hydroxybutyric Acid: 0.07 mmol/L (ref 0.05–0.27)

## 2020-11-30 LAB — COMPREHENSIVE METABOLIC PANEL
ALT: 299 U/L — ABNORMAL HIGH (ref 0–44)
AST: 246 U/L — ABNORMAL HIGH (ref 15–41)
Albumin: 2.6 g/dL — ABNORMAL LOW (ref 3.5–5.0)
Alkaline Phosphatase: 748 U/L — ABNORMAL HIGH (ref 38–126)
Anion gap: 30 — ABNORMAL HIGH (ref 5–15)
BUN: 22 mg/dL — ABNORMAL HIGH (ref 6–20)
CO2: 11 mmol/L — ABNORMAL LOW (ref 22–32)
Calcium: 7 mg/dL — ABNORMAL LOW (ref 8.9–10.3)
Chloride: 82 mmol/L — ABNORMAL LOW (ref 98–111)
Creatinine, Ser: 1.15 mg/dL — ABNORMAL HIGH (ref 0.44–1.00)
GFR, Estimated: >60 mL/min (ref >60)
Glucose, Bld: 1056 mg/dL (ref 70–99)
Potassium: 3.1 mmol/L — ABNORMAL LOW (ref 3.5–5.1)
Sodium: 123 mmol/L — ABNORMAL LOW (ref 135–145)
Total Bilirubin: 1.7 mg/dL — ABNORMAL HIGH (ref 0.3–1.2)
Total Protein: 6.2 g/dL — ABNORMAL LOW (ref 6.5–8.1)

## 2020-11-30 LAB — CBC
HCT: 34.7 % — ABNORMAL LOW (ref 36.0–46.0)
Hemoglobin: 11.6 g/dL — ABNORMAL LOW (ref 12.0–15.0)
MCH: 29.7 pg (ref 26.0–34.0)
MCHC: 33.4 g/dL (ref 30.0–36.0)
MCV: 88.7 fL (ref 80.0–100.0)
Platelets: 195 10*3/uL (ref 150–400)
RBC: 3.91 MIL/uL (ref 3.87–5.11)
RDW: 15.5 % (ref 11.5–15.5)
WBC: 6.3 10*3/uL (ref 4.0–10.5)
nRBC: 0 % (ref 0.0–0.2)

## 2020-11-30 LAB — BLOOD GAS, ARTERIAL
Acid-base deficit: 0.3 mmol/L (ref 0.0–2.0)
Bicarbonate: 23 mmol/L (ref 20.0–28.0)
FIO2: 100
O2 Saturation: 96.7 %
Patient temperature: 37
pCO2 arterial: 63.2 mmHg — ABNORMAL HIGH (ref 32.0–48.0)
pH, Arterial: 7.242 — ABNORMAL LOW (ref 7.350–7.450)
pO2, Arterial: 109 mmHg — ABNORMAL HIGH (ref 83.0–108.0)

## 2020-11-30 LAB — BLOOD GAS, VENOUS
Acid-base deficit: 14.5 mmol/L — ABNORMAL HIGH (ref 0.0–2.0)
Bicarbonate: 13.3 mmol/L — ABNORMAL LOW (ref 20.0–28.0)
FIO2: 80
O2 Saturation: 84.1 %
Patient temperature: 35
pCO2, Ven: 27 mmHg — ABNORMAL LOW (ref 44.0–60.0)
pH, Ven: 7.247 — ABNORMAL LOW (ref 7.250–7.430)
pO2, Ven: 52.7 mmHg — ABNORMAL HIGH (ref 32.0–45.0)

## 2020-11-30 LAB — URINALYSIS, ROUTINE W REFLEX MICROSCOPIC
Bacteria, UA: NONE SEEN
Bilirubin Urine: NEGATIVE
Glucose, UA: >=500 mg/dL — AB
Ketones, ur: 20 mg/dL — AB
Leukocytes,Ua: NEGATIVE
Nitrite: NEGATIVE
Protein, ur: NEGATIVE mg/dL
Specific Gravity, Urine: 1.022 (ref 1.005–1.030)
pH: 5 (ref 5.0–8.0)

## 2020-11-30 LAB — LACTIC ACID, PLASMA
Lactic Acid, Venous: 6.8 mmol/L (ref 0.5–1.9)
Lactic Acid, Venous: 3.5 mmol/L (ref 0.5–1.9)
Lactic Acid, Venous: 2.1 mmol/L (ref 0.5–1.9)
Lactic Acid, Venous: 2.7 mmol/L (ref 0.5–1.9)

## 2020-11-30 LAB — BASIC METABOLIC PANEL
Anion gap: 14 (ref 5–15)
Anion gap: 12 (ref 5–15)
Anion gap: 9 (ref 5–15)
BUN: 19 mg/dL (ref 6–20)
BUN: 17 mg/dL (ref 6–20)
BUN: 16 mg/dL (ref 6–20)
CO2: 18 mmol/L — ABNORMAL LOW (ref 22–32)
CO2: 23 mmol/L (ref 22–32)
CO2: 20 mmol/L — ABNORMAL LOW (ref 22–32)
Calcium: 6.8 mg/dL — ABNORMAL LOW (ref 8.9–10.3)
Calcium: 7 mg/dL — ABNORMAL LOW (ref 8.9–10.3)
Calcium: 6.6 mg/dL — ABNORMAL LOW (ref 8.9–10.3)
Chloride: 97 mmol/L — ABNORMAL LOW (ref 98–111)
Chloride: 99 mmol/L (ref 98–111)
Chloride: 107 mmol/L (ref 98–111)
Creatinine, Ser: 0.98 mg/dL (ref 0.44–1.00)
Creatinine, Ser: 0.8 mg/dL (ref 0.44–1.00)
Creatinine, Ser: 0.68 mg/dL (ref 0.44–1.00)
GFR, Estimated: >60 mL/min (ref >60)
GFR, Estimated: >60 mL/min (ref >60)
GFR, Estimated: >60 mL/min (ref >60)
Glucose, Bld: 623 mg/dL (ref 70–99)
Glucose, Bld: 448 mg/dL — ABNORMAL HIGH (ref 70–99)
Glucose, Bld: 268 mg/dL — ABNORMAL HIGH (ref 70–99)
Potassium: 2.6 mmol/L — CL (ref 3.5–5.1)
Potassium: 2.9 mmol/L — ABNORMAL LOW (ref 3.5–5.1)
Potassium: 3.7 mmol/L (ref 3.5–5.1)
Sodium: 129 mmol/L — ABNORMAL LOW (ref 135–145)
Sodium: 134 mmol/L — ABNORMAL LOW (ref 135–145)
Sodium: 136 mmol/L (ref 135–145)

## 2020-11-30 LAB — RAPID URINE DRUG SCREEN, HOSP PERFORMED
Amphetamines: NOT DETECTED
Barbiturates: NOT DETECTED
Benzodiazepines: NOT DETECTED
Cocaine: NOT DETECTED
Opiates: NOT DETECTED
Tetrahydrocannabinol: NOT DETECTED

## 2020-11-30 LAB — GLUCOSE, CAPILLARY
Glucose-Capillary: 194 mg/dL — ABNORMAL HIGH (ref 70–99)
Glucose-Capillary: 213 mg/dL — ABNORMAL HIGH (ref 70–99)
Glucose-Capillary: 217 mg/dL — ABNORMAL HIGH (ref 70–99)
Glucose-Capillary: 250 mg/dL — ABNORMAL HIGH (ref 70–99)
Glucose-Capillary: 260 mg/dL — ABNORMAL HIGH (ref 70–99)
Glucose-Capillary: 340 mg/dL — ABNORMAL HIGH (ref 70–99)
Glucose-Capillary: 355 mg/dL — ABNORMAL HIGH (ref 70–99)
Glucose-Capillary: 365 mg/dL — ABNORMAL HIGH (ref 70–99)
Glucose-Capillary: 412 mg/dL — ABNORMAL HIGH (ref 70–99)
Glucose-Capillary: 472 mg/dL — ABNORMAL HIGH (ref 70–99)
Glucose-Capillary: 524 mg/dL (ref 70–99)
Glucose-Capillary: >600 mg/dL (ref 70–99)
Glucose-Capillary: >600 mg/dL (ref 70–99)

## 2020-11-30 LAB — CBC WITH DIFFERENTIAL/PLATELET
Abs Immature Granulocytes: 0.07 10*3/uL (ref 0.00–0.07)
Basophils Absolute: 0.1 10*3/uL (ref 0.0–0.1)
Basophils Relative: 2 %
Eosinophils Absolute: 0 10*3/uL (ref 0.0–0.5)
Eosinophils Relative: 0 %
HCT: 39.9 % (ref 36.0–46.0)
Hemoglobin: 12.2 g/dL (ref 12.0–15.0)
Immature Granulocytes: 1 %
Lymphocytes Relative: 8 %
Lymphs Abs: 0.4 10*3/uL — ABNORMAL LOW (ref 0.7–4.0)
MCH: 29.5 pg (ref 26.0–34.0)
MCHC: 30.6 g/dL (ref 30.0–36.0)
MCV: 96.4 fL (ref 80.0–100.0)
Monocytes Absolute: 0.1 10*3/uL (ref 0.1–1.0)
Monocytes Relative: 2 %
Neutro Abs: 4.6 10*3/uL (ref 1.7–7.7)
Neutrophils Relative %: 87 %
Platelets: 273 10*3/uL (ref 150–400)
RBC: 4.14 MIL/uL (ref 3.87–5.11)
RDW: 16.6 % — ABNORMAL HIGH (ref 11.5–15.5)
WBC Morphology: INCREASED
WBC: 5.3 10*3/uL (ref 4.0–10.5)
nRBC: 0.4 % — ABNORMAL HIGH (ref 0.0–0.2)

## 2020-11-30 LAB — RESP PANEL BY RT-PCR (FLU A&B, COVID) ARPGX2
Influenza A by PCR: NEGATIVE
Influenza B by PCR: NEGATIVE
SARS Coronavirus 2 by RT PCR: POSITIVE — AB

## 2020-11-30 LAB — LACTATE DEHYDROGENASE: LDH: 500 U/L — ABNORMAL HIGH (ref 98–192)

## 2020-11-30 LAB — HEPATITIS PANEL, ACUTE
HCV Ab: NONREACTIVE
Hep A IgM: NONREACTIVE
Hep B C IgM: NONREACTIVE
Hepatitis B Surface Ag: NONREACTIVE

## 2020-11-30 LAB — CBG MONITORING, ED
Glucose-Capillary: 519 mg/dL (ref 70–99)
Glucose-Capillary: >600 mg/dL (ref 70–99)
Glucose-Capillary: >600 mg/dL (ref 70–99)
Glucose-Capillary: >600 mg/dL (ref 70–99)
Glucose-Capillary: >600 mg/dL (ref 70–99)

## 2020-11-30 LAB — HCG, SERUM, QUALITATIVE: Preg, Serum: NEGATIVE

## 2020-11-30 LAB — FIBRINOGEN: Fibrinogen: >800 mg/dL — ABNORMAL HIGH (ref 210–475)

## 2020-11-30 LAB — MRSA PCR SCREENING: MRSA by PCR: POSITIVE — AB

## 2020-11-30 LAB — MAGNESIUM: Magnesium: 2.9 mg/dL — ABNORMAL HIGH (ref 1.7–2.4)

## 2020-11-30 LAB — FERRITIN: Ferritin: 528 ng/mL — ABNORMAL HIGH (ref 11–307)

## 2020-11-30 LAB — TRIGLYCERIDES: Triglycerides: 360 mg/dL — ABNORMAL HIGH (ref <150)

## 2020-11-30 LAB — PROCALCITONIN: Procalcitonin: 32.32 ng/mL

## 2020-11-30 LAB — C-REACTIVE PROTEIN: CRP: 19.5 mg/dL — ABNORMAL HIGH (ref <1.0)

## 2020-11-30 LAB — POC SARS CORONAVIRUS 2 AG -  ED: SARS Coronavirus 2 Ag: NEGATIVE

## 2020-11-30 LAB — D-DIMER, QUANTITATIVE: D-Dimer, Quant: 3.05 ug/mL-FEU — ABNORMAL HIGH (ref 0.00–0.50)

## 2020-11-30 MED ORDER — SODIUM CHLORIDE 0.9 % IV BOLUS
20.0000 mL/kg | Freq: Once | INTRAVENOUS | Status: AC
  Administered 2020-11-30: 1000 mL via INTRAVENOUS

## 2020-11-30 MED ORDER — SODIUM CHLORIDE 0.9 % IV SOLN
100.0000 mg | Freq: Every day | INTRAVENOUS | Status: AC
  Administered 2020-12-01 – 2020-12-04 (×4): 100 mg via INTRAVENOUS
  Filled 2020-11-30 (×4): qty 20

## 2020-11-30 MED ORDER — LORAZEPAM 2 MG/ML IJ SOLN
0.5000 mg | INTRAMUSCULAR | Status: DC | PRN
  Administered 2020-11-30 – 2020-12-02 (×4): 0.5 mg via INTRAVENOUS
  Filled 2020-11-30 (×5): qty 1

## 2020-11-30 MED ORDER — DEXTROSE IN LACTATED RINGERS 5 % IV SOLN: INTRAVENOUS | Status: DC

## 2020-11-30 MED ORDER — SODIUM CHLORIDE 0.9 % IV SOLN
1000.0000 mL | INTRAVENOUS | Status: DC
  Administered 2020-11-30: 1000 mL via INTRAVENOUS

## 2020-11-30 MED ORDER — MUPIROCIN 2 % EX OINT
1.0000 "application " | TOPICAL_OINTMENT | Freq: Two times a day (BID) | CUTANEOUS | Status: AC
  Administered 2020-12-01 – 2020-12-05 (×9): 1 via NASAL
  Filled 2020-11-30 (×2): qty 22

## 2020-11-30 MED ORDER — LACTATED RINGERS IV BOLUS
20.0000 mL/kg | Freq: Once | INTRAVENOUS | Status: AC
  Administered 2020-11-30: 1000 mL via INTRAVENOUS

## 2020-11-30 MED ORDER — SODIUM CHLORIDE 0.9 % IV SOLN
1.0000 g | Freq: Once | INTRAVENOUS | Status: AC
  Administered 2020-11-30: 1 g via INTRAVENOUS
  Filled 2020-11-30: qty 10

## 2020-11-30 MED ORDER — FENTANYL 2500MCG IN NS 250ML (10MCG/ML) PREMIX INFUSION
0.0000 ug/h | INTRAVENOUS | Status: DC
  Administered 2020-11-30: 25 ug/h via INTRAVENOUS
  Filled 2020-11-30 (×2): qty 250

## 2020-11-30 MED ORDER — GUAIFENESIN-DM 100-10 MG/5ML PO SYRP: 10.0000 mL | ORAL_SOLUTION | ORAL | Status: DC | PRN

## 2020-11-30 MED ORDER — IPRATROPIUM-ALBUTEROL 20-100 MCG/ACT IN AERS: 1.0000 | INHALATION_SPRAY | Freq: Four times a day (QID) | RESPIRATORY_TRACT | Status: DC | PRN

## 2020-11-30 MED ORDER — HEPARIN SODIUM (PORCINE) 5000 UNIT/ML IJ SOLN
5000.0000 [IU] | Freq: Three times a day (TID) | INTRAMUSCULAR | Status: DC
  Administered 2020-11-30 – 2020-12-01 (×3): 5000 [IU] via SUBCUTANEOUS
  Filled 2020-11-30 (×3): qty 1

## 2020-11-30 MED ORDER — METHYLPREDNISOLONE SODIUM SUCC 40 MG IJ SOLR
40.0000 mg | Freq: Two times a day (BID) | INTRAMUSCULAR | Status: DC
  Administered 2020-11-30 – 2020-12-03 (×6): 40 mg via INTRAVENOUS
  Filled 2020-11-30 (×6): qty 1

## 2020-11-30 MED ORDER — GABAPENTIN 100 MG PO CAPS
100.0000 mg | ORAL_CAPSULE | Freq: Three times a day (TID) | ORAL | Status: DC
  Administered 2020-12-01: 100 mg via ORAL
  Filled 2020-11-30: qty 1

## 2020-11-30 MED ORDER — DEXMEDETOMIDINE HCL IN NACL 400 MCG/100ML IV SOLN
0.4000 ug/kg/h | INTRAVENOUS | Status: DC
  Administered 2020-11-30: 0.8 ug/kg/h via INTRAVENOUS
  Administered 2020-12-02: 1 ug/kg/h via INTRAVENOUS
  Administered 2020-12-02: 0.4 ug/kg/h via INTRAVENOUS
  Administered 2020-12-03 (×3): 1.2 ug/kg/h via INTRAVENOUS
  Filled 2020-11-30 (×6): qty 100

## 2020-11-30 MED ORDER — CHLORHEXIDINE GLUCONATE CLOTH 2 % EX PADS
6.0000 | MEDICATED_PAD | Freq: Every day | CUTANEOUS | Status: DC
  Administered 2020-11-30 – 2020-12-13 (×12): 6 via TOPICAL

## 2020-11-30 MED ORDER — POTASSIUM CHLORIDE 10 MEQ/100ML IV SOLN: 10.0000 meq | INTRAVENOUS | Status: DC

## 2020-11-30 MED ORDER — REMDESIVIR 100 MG IV SOLR
100.0000 mg | INTRAVENOUS | Status: AC
  Administered 2020-11-30 (×2): 100 mg via INTRAVENOUS
  Filled 2020-11-30 (×2): qty 20

## 2020-11-30 MED ORDER — INSULIN REGULAR(HUMAN) IN NACL 100-0.9 UT/100ML-% IV SOLN
INTRAVENOUS | Status: AC
  Administered 2020-11-30: 6 [IU]/h via INTRAVENOUS
  Administered 2020-12-01: 3 [IU]/h via INTRAVENOUS
  Filled 2020-11-30 (×2): qty 100

## 2020-11-30 MED ORDER — MIDAZOLAM 50MG/50ML (1MG/ML) PREMIX INFUSION
0.5000 mg/h | INTRAVENOUS | Status: DC
  Administered 2020-11-30: 0.5 mg/h via INTRAVENOUS
  Administered 2020-12-01: 1 mg/h via INTRAVENOUS
  Filled 2020-11-30 (×3): qty 50

## 2020-11-30 MED ORDER — SODIUM CHLORIDE 0.9 % IV SOLN
2.0000 g | Freq: Two times a day (BID) | INTRAVENOUS | Status: DC
  Administered 2020-11-30 – 2020-12-01 (×3): 2 g via INTRAVENOUS
  Filled 2020-11-30 (×3): qty 2

## 2020-11-30 MED ORDER — POTASSIUM CHLORIDE 10 MEQ/100ML IV SOLN
10.0000 meq | INTRAVENOUS | Status: AC
  Administered 2020-11-30 (×4): 10 meq via INTRAVENOUS
  Filled 2020-11-30 (×3): qty 100

## 2020-11-30 MED ORDER — POTASSIUM CHLORIDE 10 MEQ/100ML IV SOLN
10.0000 meq | INTRAVENOUS | Status: AC
  Administered 2020-11-30 (×4): 10 meq via INTRAVENOUS
  Filled 2020-11-30 (×4): qty 100

## 2020-11-30 MED ORDER — CALCIUM GLUCONATE-NACL 1-0.675 GM/50ML-% IV SOLN
1.0000 g | Freq: Once | INTRAVENOUS | Status: AC
  Administered 2020-11-30: 1000 mg via INTRAVENOUS
  Filled 2020-11-30: qty 50

## 2020-11-30 MED ORDER — DEXTROSE 50 % IV SOLN
0.0000 mL | INTRAVENOUS | Status: DC | PRN
  Administered 2020-12-01: 25 mL via INTRAVENOUS
  Administered 2020-12-04: 08:00:00 50 mL via INTRAVENOUS
  Administered 2020-12-04: 25 mL via INTRAVENOUS
  Filled 2020-11-30 (×3): qty 50

## 2020-11-30 MED ORDER — SODIUM CHLORIDE 0.9 % IV SOLN
500.0000 mg | Freq: Once | INTRAVENOUS | Status: AC
  Administered 2020-11-30: 500 mg via INTRAVENOUS
  Filled 2020-11-30: qty 500

## 2020-11-30 MED ORDER — LACTATED RINGERS IV SOLN: INTRAVENOUS | Status: DC

## 2020-11-30 MED ORDER — DEXTROSE 50 % IV SOLN: 0.0000 mL | INTRAVENOUS | Status: DC | PRN

## 2020-11-30 NOTE — Progress Notes (Signed)
Patient on 15 liters high flow and 15 non-rebreather. Oxygen staying in the upper 80s range. Patient extremely anxious and continues to holler for help and that she is short of breath. Dr. Sherryll Burger notified and order received for 0.5 mg IV. Ativan given at 1434. Pt oxygen continues to drop staying in the 70s range. Jaime with respiratory called and placing patient on heated high flow nasal cannula. Currently on 40 liters with non-rebreather. Oxygen staying in the 60s and 70s. Plan to place on Bipap.

## 2020-11-30 NOTE — H&P (Addendum)
History and Physical    [name] DOB: April 27, 1986 DOA: 11/30/2020  [facility]P: [facility]   Patient coming from: Home via EMS  Chief Complaint: Weakness/hypoxemia  HPI: [name] is a 35 y.o. female with medical history significant for type 1 diabetes, GERD, and cocaine use who presented to the ED via EMS for evaluation of weakness and significant hypoxemia.  She was noted to be 66% on pulse oximetry on room air and blood sugar was noted to be greater than 600.  She has a history of prior episodes of DKA and actively uses cocaine, but denies any IV drug use.  She presented to the ED approximately 2 days ago and at that time was having some nausea and vomiting was found to have pneumonia for which she was discharged on cefdinir and Zithromax.  She has been compliant with her antibiotics, but has had worsening cough as well as shortness of breath and weakness and now nausea/vomiting and diarrhea as well.  She is vaccinated for COVID, but does not have booster.   ED Course: Patient noted to have tachycardia as well as hypothermia and tachypnea.  She is currently on nonrebreather mask further hypoxemia.  Lactic acid is 6.8.  Covid antigen is negative and full results are still pending.  Her glucose is over 1000 and sodium is 123 with bicarb of 11.  BUN 22 and creatinine 1.15.  Potassium is 3.1.  She has been started on IV fluids as well as insulin drip and has been given azithromycin and Rocephin in the ED.  Her LFTs are noted to be elevated as well as her alkaline phosphatase levels.  She is noted to have EKG with sinus tachycardia and elevated QT interval.  Review of Systems: Reviewed as noted above, otherwise negative.  Past Medical History:  Diagnosis Date  . Acid reflux 2010  . Diabetes mellitus   . DKA 05/01/2011  . Polysubstance abuse [facility])     Past Surgical History:  Procedure Laterality Date  . TUBAL LIGATION    . WOUND EXPLORATION Left 07/26/2014    Procedure: Irrigation, debridement and exploration of elbow wounds;  Surgeon: Betha Loa, MD;  Location: [facility] OR;  Service: Orthopedics;  Laterality: Left;     reports that she has been smoking cigarettes. She has a 15.00 pack-year smoking history. She has never used smokeless tobacco. She reports current drug use. Drug: Cocaine. She reports that she does not drink alcohol.  Allergies  Allergen Reactions  . Codeine Anaphylaxis and Nausea And Vomiting  . Penicillins Rash    As a teenager  . Tramadol Itching    Family History  Problem Relation Age of Onset  . Coronary artery disease Mother   . Lung cancer Mother   . Heart attack Father 6  . Thyroid disease Maternal Grandmother   . Diabetes type I Daughter     Prior to Admission medications   Medication Sig Start Date End Date Taking? Authorizing Provider  azithromycin (ZITHROMAX) 250 MG tablet Take 2 tablets together on the first day, then 1 every day until finished. 11/28/20   Sabas Sous, MD  cefdinir (OMNICEF) 300 MG capsule Take 1 capsule (300 mg total) by mouth 2 (two) times daily for 7 days. 11/28/20 12/05/20  Sabas Sous, MD  cephALEXin (KEFLEX) 500 MG capsule Take 1 capsule (500 mg total) by mouth 4 (four) times daily. 09/03/20   Elpidio Anis, PA-C  gabapentin (NEURONTIN) 300 MG capsule Take 300 mg by mouth  3 (three) times daily.  08/24/18   [provider]  insulin aspart (NOVOLOG FLEXPEN) 100 UNIT/ML FlexPen Inject 1-15 Units into the skin 3 (three) times daily with meals. Dose based on CBG and carb count, 1 unit for every 15 carb.    [provider]  Insulin Glargine (LANTUS SOLOSTAR) 100 UNIT/ML Solostar Pen Inject 30 Units into the skin daily at 10 pm. Patient taking differently: Inject 35 Units into the skin daily.  06/27/19 04/06/20  Pricilla Loveless, MD  linaclotide (LINZESS) 290 MCG CAPS capsule Take 290 mcg by mouth daily as needed (for constipation).     [provider]  magic  mouthwash w/lidocaine SOLN Take 10 mLs by mouth 4 (four) times daily as needed for mouth pain. Patient not taking: Reported on 06/27/2019 03/10/19   Orpah Cobb P, DO  naproxen (NAPROSYN) 500 MG tablet Take 1 tablet (500 mg total) by mouth 2 (two) times daily. 11/28/20   Sabas Sous, MD  omeprazole (PRILOSEC) 40 MG capsule Take 40 mg by mouth daily before breakfast.     [provider]  ondansetron (ZOFRAN ODT) 4 MG disintegrating tablet Take 1 tablet (4 mg total) by mouth every 8 (eight) hours as needed for nausea or vomiting. 04/07/20   Mirian Mo, MD  ondansetron (ZOFRAN) 4 MG tablet Take 1 tablet (4 mg total) by mouth daily as needed for nausea or vomiting. 04/07/20 04/07/21  Mirian Mo, MD  phenol (CHLORASEPTIC) 1.4 % LIQD Use as directed 1 spray in the mouth or throat as needed for throat irritation / pain. Patient not taking: Reported on 06/27/2019 03/10/19   Orpah Cobb P, DO  polyethylene glycol (MIRALAX / GLYCOLAX) 17 g packet Take 17 g by mouth daily. 04/08/20   Mirian Mo, MD  tiotropium (SPIRIVA) 18 MCG inhalation capsule Place 18 mcg into inhaler and inhale daily.    [provider]    Physical Exam: Vitals:   11/30/20 0911 11/30/20 1002 11/30/20 1015 11/30/20 1030  BP:  110/79 127/76 115/71  Pulse:  (!) 103 (!) 102 (!) 104  Resp:  (!) 22 20 (!) 23  Temp: (!) 94.6 F (34.8 C)     TempSrc: Rectal     SpO2:  90% (!) 89% (!) 86%  Weight:      Height:        Constitutional: NAD, calm, comfortable, thin Vitals:   11/30/20 0911 11/30/20 1002 11/30/20 1015 11/30/20 1030  BP:  110/79 127/76 115/71  Pulse:  (!) 103 (!) 102 (!) 104  Resp:  (!) 22 20 (!) 23  Temp: (!) 94.6 F (34.8 C)     TempSrc: Rectal     SpO2:  90% (!) 89% (!) 86%  Weight:      Height:       Eyes: lids and conjunctivae normal Neck: normal, supple Respiratory: clear to auscultation bilaterally. Normal respiratory effort. No accessory muscle use. Currently on  non-rebreather Cardiovascular: Regular rate and rhythm, no murmurs. Abdomen: no tenderness, no distention. Bowel sounds positive.  Musculoskeletal:  No edema. Skin: no rashes, lesions, ulcers.  Psychiatric: Flat affect  Labs on Admission: I have personally reviewed following labs and imaging studies  CBC: Recent Labs  Lab 11/27/20 1958 11/30/20 0856  WBC 21.1* 5.3  NEUTROABS  --  4.6  HGB 14.4 12.2  HCT 43.0 39.9  MCV 87.8 96.4  PLT 273 273   Basic Metabolic Panel: Recent Labs  Lab 11/27/20 1958 11/30/20 0856  NA 129* 123*  K 3.5 3.1*  CL 93* 82*  CO2 23 11*  GLUCOSE 554* 1,056*  BUN 33* 22*  CREATININE 1.11* 1.15*  CALCIUM 7.3* 7.0*  MG  --  2.9*   GFR: Estimated Creatinine Clearance: 48.6 mL/min (A) (by C-G formula based on SCr of 1.15 mg/dL (H)). Liver Function Tests: Recent Labs  Lab 11/27/20 1958 11/30/20 0856  AST 100* 246*  ALT 130* 299*  ALKPHOS 139* 748*  BILITOT 0.8 1.7*  PROT 7.2 6.2*  ALBUMIN 3.1* 2.6*   Recent Labs  Lab 11/27/20 1958  LIPASE 18   No results for input(s): AMMONIA in the last 168 hours. Coagulation Profile: No results for input(s): INR, PROTIME in the last 168 hours. Cardiac Enzymes: No results for input(s): CKTOTAL, CKMB, CKMBINDEX, TROPONINI in the last 168 hours. BNP (last 3 results) No results for input(s): PROBNP in the last 8760 hours. HbA1C: No results for input(s): HGBA1C in the last 72 hours. CBG: Recent Labs  Lab 11/30/20 0846 11/30/20 1021  GLUCAP >600* >600*   Lipid Profile: Recent Labs    11/30/20 0856  TRIG 360*   Thyroid Function Tests: No results for input(s): TSH, T4TOTAL, FREET4, T3FREE, THYROIDAB in the last 72 hours. Anemia Panel: Recent Labs    11/30/20 0856  FERRITIN 528*   Urine analysis:    Component Value Date/Time   COLORURINE STRAW (A) 11/30/2020 0949   APPEARANCEUR CLEAR 11/30/2020 0949   LABSPEC 1.022 11/30/2020 0949   PHURINE 5.0 11/30/2020 0949   GLUCOSEU >=500 (A)  11/30/2020 0949   HGBUR MODERATE (A) 11/30/2020 0949   BILIRUBINUR NEGATIVE 11/30/2020 0949   KETONESUR 20 (A) 11/30/2020 0949   PROTEINUR NEGATIVE 11/30/2020 0949   UROBILINOGEN 1.0 07/28/2015 1402   NITRITE NEGATIVE 11/30/2020 0949   LEUKOCYTESUR NEGATIVE 11/30/2020 0949    Radiological Exams on Admission: DG Chest Port 1 View  Result Date: 11/30/2020 CLINICAL DATA:  Shortness of breath, recent pneumonia in a 35 year old female EXAM: PORTABLE CHEST 1 VIEW COMPARISON:  November 28, 2020 FINDINGS: Interstitial and airspace disease worse than on the prior study, superimposed on a background of pulmonary emphysema. Interval development of a small RIGHT-sided pleural effusion. Trachea midline. Cardiomediastinal contours and hilar structures are stable. Nodular component to the airspace process with focal areas of nodularity the for example in the RIGHT upper and LEFT lower lobe. No acute skeletal process on limited assessment. IMPRESSION: 1. Worsening interstitial and airspace disease superimposed on a background of pulmonary emphysema. Findings suspicious for multifocal pneumonia. Given nodular component would suggest follow-up to ensure resolution and exclude underlying pulmonary nodules. 2. New small RIGHT-sided pleural effusion. Could also consider the possibility of an overlay of heart failure. Electronically Signed   By: Donzetta Kohut M.D.   On: 11/30/2020 09:21    EKG: Independently reviewed. ST 101 bpm.  Assessment/Plan Active Problems:   DKA, type 1 (HCC)    Septic shock secondary to multifocal pneumonia with COVID 19 and suspected bacterial superinfection -Elevated procalcitonin noted -Plan to transition to cefepime for now -Check MRSA -Aggressive IV fluid resuscitation with further bolus, continue normal saline -Continue to monitor lactic acid trend -Monitor microbiology -Careful observation in stepdown unit  Acute hypoxemic respiratory failure secondary to Covid  pneumonia -Start on remdesivir as well as Solu-Medrol twice daily -Avoid Actemra given elevated procalcitonin -Currently requiring BiPAP  DKA, type I secondary to above -Continue on insulin drip and monitor repeat labs with anion gap and CO2 as well as beta hydroxybutyrate -Keep n.p.o. until ready  for transition -Check hemoglobin A1c  AKI -Continue aggressive IV fluid hydration as this is likely prerenal -Continue to monitor input and output -Avoid nephrotoxic agents  Transaminitis -With obstructive pattern -Plan to check ultrasound right upper quadrant -Check hepatitis panel given drug use history  Hyponatremia -Likely pseudohyponatremia related to DKA -Continue on normal saline IV infusion and monitor  Hypokalemia -Continue to replete aggressively while on insulin drip  Mild hypocalcemia -Calcium corrected is 8.1 -Noted to be a chronic issue -Plan to give calcium gluconate and monitor on repeat  Cocaine abuse -Last used 3 days ago -Counseled on cessation -Urine toxicology  Prolonged QTC -Monitor magnesium and potassium levels -Monitor on telemetry -Likely also related to hypocalcemia above  GERD -IV PPI  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

## 2020-11-30 NOTE — ED Triage Notes (Addendum)
Pt dx with pneumonia 3 days ago. Pt brought in by EMS due to weakness. Upon arrival sats 66% and pt placed on NRB at 15 L Blood sugar greater than 600

## 2020-11-30 NOTE — ED Notes (Signed)
Bair hugger applied.

## 2020-11-30 NOTE — ED Provider Notes (Signed)
I was called around 7 PM to intubate this patient Dr. Clelia Croft was notified by the nurses and he stated that she needed to be intubated.  Patient was given succinylcholine and etomidate.  Patient was intubated with a #7 tube using the glide scope.  There were no difficulties with intubation and she was intubated on the first attempt.  Placement was verified by carbon dioxide detection and auscultation   Bethann Berkshire, MD 11/30/20 1926

## 2020-11-30 NOTE — ED Provider Notes (Signed)
[facility] EMERGENCY DEPARTMENT Provider Note   CSN: [id number] Arrival date & time: 11/30/20  4098     History Chief Complaint  Patient presents with  . Weakness       . hypoxia    Nini Cavan is a 35 y.o. female.  She is here by EMS for evaluation of weakness.  She had sats of 66% on room air was placed on a nonrebreather.  Blood sugar greater than 600.  She is a history of DKA and is actively using cocaine.  Denies IV drug use.  Was here 3 days ago for nausea vomiting and found to have pneumonia treated with cefdinir and Zithromax.  Patient states she has been compliant with medication.  Continues to have cough shortness of breath weakness nausea vomiting diarrhea.  She is not COVID vaccinated.  The history is provided by the patient.  Influenza Presenting symptoms: cough, diarrhea, fatigue, fever, myalgias, nausea, shortness of breath and vomiting   Presenting symptoms: no headaches and no sore throat   Fatigue:    Severity:  Severe   Timing:  Constant   Progression:  Worsening Shortness of breath:    Severity:  Moderate   Onset quality:  Gradual   Timing:  Constant   Progression:  Worsening Chronicity:  New Relieved by:  Nothing Worsened by:  Nothing Ineffective treatments:  Prescription medications Associated symptoms: no neck stiffness   Risk factors: diabetes        Past Medical History:  Diagnosis Date  . Acid reflux 2010  . Diabetes mellitus   . DKA 05/01/2011  . Polysubstance abuse [facility])     Patient Active Problem List   Diagnosis Date Noted  . Hyperosmolar hyperglycemic state (HHS) (HCC) 04/06/2020  . Generalized abdominal pain   . Cocaine abuse (HCC)   . Pyelonephritis 03/08/2019  . Abscess   . Systolic murmur   . Hyperglycemia due to type 1 diabetes mellitus (HCC) 10/28/2018  . Substance abuse (HCC) 09/15/2018  . Tobacco abuse 09/15/2018  . Cough 09/15/2018  . Protein-calorie malnutrition, severe (HCC) 09/15/2018  . GERD  (gastroesophageal reflux disease) 09/15/2018  . Nausea & vomiting   . Acute lower UTI 07/14/2016  . Panic disorder without agoraphobia with moderate panic attacks 01/20/2016  . Benzodiazepine abuse (HCC) 01/20/2016  . Substance induced mood disorder (HCC) 01/19/2016  . DKA 05/01/2011  . Type 1 diabetes mellitus with other specified complication (HCC) 05/01/2011  . Leukocytosis 05/01/2011    Past Surgical History:  Procedure Laterality Date  . TUBAL LIGATION    . WOUND EXPLORATION Left 07/26/2014   Procedure: Irrigation, debridement and exploration of elbow wounds;  Surgeon: Betha Loa, MD;  Location: [facility] OR;  Service: Orthopedics;  Laterality: Left;     OB History    Gravida  2   Para  2   Term      Preterm      AB      Living        SAB      IAB      Ectopic      Multiple      Live Births              Family History  Problem Relation Age of Onset  . Coronary artery disease Mother   . Lung cancer Mother   . Heart attack Father 8  . Thyroid disease Maternal Grandmother   . Diabetes type I Daughter     Social History  Tobacco Use  . Smoking status: Current Every Day Smoker    Packs/day: 1.00    Years: 15.00    Pack years: 15.00    Types: Cigarettes  . Smokeless tobacco: Never Used  . Tobacco comment: She is currently trying to quit.  Vaping Use  . Vaping Use: Never used  Substance Use Topics  . Alcohol use: No  . Drug use: Yes    Types: Cocaine    Comment: 2-3 days ago    Home Medications Prior to Admission medications   Medication Sig Start Date End Date Taking? Authorizing Provider  azithromycin (ZITHROMAX) 250 MG tablet Take 2 tablets together on the first day, then 1 every day until finished. 11/28/20   Sabas Sous, MD  cefdinir (OMNICEF) 300 MG capsule Take 1 capsule (300 mg total) by mouth 2 (two) times daily for 7 days. 11/28/20 12/05/20  Sabas Sous, MD  cephALEXin (KEFLEX) 500 MG capsule Take 1 capsule (500 mg total) by  mouth 4 (four) times daily. 09/03/20   Elpidio Anis, PA-C  gabapentin (NEURONTIN) 300 MG capsule Take 300 mg by mouth 3 (three) times daily.  08/24/18   [provider]  insulin aspart (NOVOLOG FLEXPEN) 100 UNIT/ML FlexPen Inject 1-15 Units into the skin 3 (three) times daily with meals. Dose based on CBG and carb count, 1 unit for every 15 carb.    [provider]  Insulin Glargine (LANTUS SOLOSTAR) 100 UNIT/ML Solostar Pen Inject 30 Units into the skin daily at 10 pm. Patient taking differently: Inject 35 Units into the skin daily.  06/27/19 04/06/20  Pricilla Loveless, MD  linaclotide (LINZESS) 290 MCG CAPS capsule Take 290 mcg by mouth daily as needed (for constipation).     [provider]  magic mouthwash w/lidocaine SOLN Take 10 mLs by mouth 4 (four) times daily as needed for mouth pain. Patient not taking: Reported on 06/27/2019 03/10/19   Orpah Cobb P, DO  naproxen (NAPROSYN) 500 MG tablet Take 1 tablet (500 mg total) by mouth 2 (two) times daily. 11/28/20   Sabas Sous, MD  omeprazole (PRILOSEC) 40 MG capsule Take 40 mg by mouth daily before breakfast.     [provider]  ondansetron (ZOFRAN ODT) 4 MG disintegrating tablet Take 1 tablet (4 mg total) by mouth every 8 (eight) hours as needed for nausea or vomiting. 04/07/20   Mirian Mo, MD  ondansetron (ZOFRAN) 4 MG tablet Take 1 tablet (4 mg total) by mouth daily as needed for nausea or vomiting. 04/07/20 04/07/21  Mirian Mo, MD  phenol (CHLORASEPTIC) 1.4 % LIQD Use as directed 1 spray in the mouth or throat as needed for throat irritation / pain. Patient not taking: Reported on 06/27/2019 03/10/19   Orpah Cobb P, DO  polyethylene glycol (MIRALAX / GLYCOLAX) 17 g packet Take 17 g by mouth daily. 04/08/20   Mirian Mo, MD  tiotropium (SPIRIVA) 18 MCG inhalation capsule Place 18 mcg into inhaler and inhale daily.    [provider]    Allergies    Codeine, Penicillins, and  Tramadol  Review of Systems   Review of Systems  Constitutional: Positive for fatigue and fever.  HENT: Negative for sore throat.   Eyes: Negative for visual disturbance.  Respiratory: Positive for cough and shortness of breath.   Cardiovascular: Negative for chest pain.  Gastrointestinal: Positive for diarrhea, nausea and vomiting.  Genitourinary: Negative for dysuria.  Musculoskeletal: Positive for myalgias. Negative for neck stiffness.  Skin: Negative  for rash.  Neurological: Negative for headaches.    Physical Exam Updated Vital Signs BP 117/65 (BP Location: Left Arm)   Pulse (!) 102   Resp (!) 21   Ht 5' (1.524 m)   Wt 49 kg   LMP 11/25/2020   SpO2 91%   BMI 21.10 kg/m   Physical Exam Vitals and nursing note reviewed.  Constitutional:      General: She is in acute distress.     Appearance: She is well-developed and well-nourished. She is ill-appearing.  HENT:     Head: Normocephalic and atraumatic.  Eyes:     Conjunctiva/sclera: Conjunctivae normal.  Cardiovascular:     Rate and Rhythm: Regular rhythm. Tachycardia present.     Heart sounds: No murmur heard.   Pulmonary:     Effort: Tachypnea and accessory muscle usage present. No respiratory distress.     Breath sounds: Normal breath sounds.  Abdominal:     Palpations: Abdomen is soft.     Tenderness: There is no abdominal tenderness. There is no guarding or rebound.  Musculoskeletal:        General: No deformity, signs of injury or edema. Normal range of motion.     Cervical back: Neck supple.  Skin:    General: Skin is warm and dry.     Capillary Refill: Capillary refill takes less than 2 seconds.  Neurological:     General: No focal deficit present.     Mental Status: She is alert.  Psychiatric:        Mood and Affect: Mood is anxious.     ED Results / Procedures / Treatments   Labs (all labs ordered are listed, but only abnormal results are displayed) Labs Reviewed  RESP PANEL BY RT-PCR (FLU  A&B, COVID) ARPGX2 - Abnormal; Notable for the following components:      Result Value   SARS Coronavirus 2 by RT PCR POSITIVE (*)    All other components within normal limits  MRSA PCR SCREENING - Abnormal; Notable for the following components:   MRSA by PCR POSITIVE (*)    All other components within normal limits  LACTIC ACID, PLASMA - Abnormal; Notable for the following components:   Lactic Acid, Venous 6.8 (*)    All other components within normal limits  LACTIC ACID, PLASMA - Abnormal; Notable for the following components:   Lactic Acid, Venous 3.5 (*)    All other components within normal limits  CBC WITH DIFFERENTIAL/PLATELET - Abnormal; Notable for the following components:   RDW 16.6 (*)    nRBC 0.4 (*)    Lymphs Abs 0.4 (*)    All other components within normal limits  COMPREHENSIVE METABOLIC PANEL - Abnormal; Notable for the following components:   Sodium 123 (*)    Potassium 3.1 (*)    Chloride 82 (*)    CO2 11 (*)    Glucose, Bld 1,056 (*)    BUN 22 (*)    Creatinine, Ser 1.15 (*)    Calcium 7.0 (*)    Total Protein 6.2 (*)    Albumin 2.6 (*)    AST 246 (*)    ALT 299 (*)    Alkaline Phosphatase 748 (*)    Total Bilirubin 1.7 (*)    Anion gap 30 (*)    All other components within normal limits  D-DIMER, QUANTITATIVE (NOT AT [facility]) - Abnormal; Notable for the following components:   D-Dimer, Quant 3.05 (*)    All other components within  normal limits  LACTATE DEHYDROGENASE - Abnormal; Notable for the following components:   LDH 500 (*)    All other components within normal limits  FERRITIN - Abnormal; Notable for the following components:   Ferritin 528 (*)    All other components within normal limits  TRIGLYCERIDES - Abnormal; Notable for the following components:   Triglycerides 360 (*)    All other components within normal limits  FIBRINOGEN - Abnormal; Notable for the following components:   Fibrinogen >800 (*)    All other components within normal  limits  C-REACTIVE PROTEIN - Abnormal; Notable for the following components:   CRP 19.5 (*)    All other components within normal limits  BETA-HYDROXYBUTYRIC ACID - Abnormal; Notable for the following components:   Beta-Hydroxybutyric Acid >8.00 (*)    All other components within normal limits  BLOOD GAS, VENOUS - Abnormal; Notable for the following components:   pH, Ven 7.247 (*)    pCO2, Ven 27.0 (*)    pO2, Ven 52.7 (*)    Bicarbonate 13.3 (*)    Acid-base deficit 14.5 (*)    All other components within normal limits  URINALYSIS, ROUTINE W REFLEX MICROSCOPIC - Abnormal; Notable for the following components:   Color, Urine STRAW (*)    Glucose, UA >=500 (*)    Hgb urine dipstick MODERATE (*)    Ketones, ur 20 (*)    All other components within normal limits  MAGNESIUM - Abnormal; Notable for the following components:   Magnesium 2.9 (*)    All other components within normal limits  CBC - Abnormal; Notable for the following components:   Hemoglobin 11.6 (*)    HCT 34.7 (*)    All other components within normal limits  BASIC METABOLIC PANEL - Abnormal; Notable for the following components:   Sodium 129 (*)    Potassium 2.6 (*)    Chloride 97 (*)    CO2 18 (*)    Glucose, Bld 623 (*)    Calcium 6.8 (*)    All other components within normal limits  BASIC METABOLIC PANEL - Abnormal; Notable for the following components:   Sodium 134 (*)    Potassium 2.9 (*)    Glucose, Bld 448 (*)    Calcium 7.0 (*)    All other components within normal limits  BETA-HYDROXYBUTYRIC ACID - Abnormal; Notable for the following components:   Beta-Hydroxybutyric Acid 4.23 (*)    All other components within normal limits  GLUCOSE, CAPILLARY - Abnormal; Notable for the following components:   Glucose-Capillary >600 (*)    All other components within normal limits  GLUCOSE, CAPILLARY - Abnormal; Notable for the following components:   Glucose-Capillary >600 (*)    All other components within  normal limits  GLUCOSE, CAPILLARY - Abnormal; Notable for the following components:   Glucose-Capillary 524 (*)    All other components within normal limits  GLUCOSE, CAPILLARY - Abnormal; Notable for the following components:   Glucose-Capillary 472 (*)    All other components within normal limits  GLUCOSE, CAPILLARY - Abnormal; Notable for the following components:   Glucose-Capillary 412 (*)    All other components within normal limits  GLUCOSE, CAPILLARY - Abnormal; Notable for the following components:   Glucose-Capillary 365 (*)    All other components within normal limits  GLUCOSE, CAPILLARY - Abnormal; Notable for the following components:   Glucose-Capillary 355 (*)    All other components within normal limits  GLUCOSE, CAPILLARY - Abnormal; Notable  for the following components:   Glucose-Capillary 340 (*)    All other components within normal limits  CBG MONITORING, ED - Abnormal; Notable for the following components:   Glucose-Capillary >600 (*)    All other components within normal limits  CBG MONITORING, ED - Abnormal; Notable for the following components:   Glucose-Capillary >600 (*)    All other components within normal limits  CBG MONITORING, ED - Abnormal; Notable for the following components:   Glucose-Capillary >600 (*)    All other components within normal limits  CBG MONITORING, ED - Abnormal; Notable for the following components:   Glucose-Capillary >600 (*)    All other components within normal limits  CULTURE, BLOOD (ROUTINE X 2)  CULTURE, BLOOD (ROUTINE X 2)  PROCALCITONIN  HCG, SERUM, QUALITATIVE  BASIC METABOLIC PANEL  BASIC METABOLIC PANEL  BETA-HYDROXYBUTYRIC ACID  HEMOGLOBIN A1C  RAPID URINE DRUG SCREEN, HOSP PERFORMED  HEPATITIS PANEL, ACUTE  LACTIC ACID, PLASMA  LACTIC ACID, PLASMA  BASIC METABOLIC PANEL  BETA-HYDROXYBUTYRIC ACID  PROCALCITONIN  CBC WITH DIFFERENTIAL/PLATELET  COMPREHENSIVE METABOLIC PANEL  C-REACTIVE PROTEIN  D-DIMER,  QUANTITATIVE (NOT AT [facility])  MAGNESIUM  FERRITIN  POC SARS CORONAVIRUS 2 AG -  ED    EKG EKG Interpretation  Date/Time:  Monday November 30 2020 09:47:06 EST Ventricular Rate:  101 PR Interval:    QRS Duration: 94 QT Interval:  398 QTC Calculation: 516 R Axis:   97 Text Interpretation: Sinus tachycardia Borderline right axis deviation Consider left ventricular hypertrophy Prolonged QT interval increased rate and QT from prior 6/21 Confirmed by Meridee ScoreButler, Cletus Paris [phone]([id number]) on 11/30/2020 9:52:08 AM   Radiology DG Chest Port 1 View  Result Date: 11/30/2020 CLINICAL DATA:  Shortness of breath, recent pneumonia in a 35 year old female EXAM: PORTABLE CHEST 1 VIEW COMPARISON:  November 28, 2020 FINDINGS: Interstitial and airspace disease worse than on the prior study, superimposed on a background of pulmonary emphysema. Interval development of a small RIGHT-sided pleural effusion. Trachea midline. Cardiomediastinal contours and hilar structures are stable. Nodular component to the airspace process with focal areas of nodularity the for example in the RIGHT upper and LEFT lower lobe. No acute skeletal process on limited assessment. IMPRESSION: 1. Worsening interstitial and airspace disease superimposed on a background of pulmonary emphysema. Findings suspicious for multifocal pneumonia. Given nodular component would suggest follow-up to ensure resolution and exclude underlying pulmonary nodules. 2. New small RIGHT-sided pleural effusion. Could also consider the possibility of an overlay of heart failure. Electronically Signed   By: Donzetta KohutGeoffrey  Wile M.D.   On: 11/30/2020 09:21   US Abdomen Limited RUQ (LIVER/GB)  Result Date: 11/30/2020 CLINICAL DATA:  Right upper quadrant pain and elevated LFTs EXAM: ULTRASOUND ABDOMEN LIMITED RIGHT UPPER QUADRANT COMPARISON:  None. FINDINGS: Gallbladder: Gallbladder is well distended. Mild gallbladder sludge is noted. No gallstones are seen. Common bile duct: Diameter: 3.2  mm. Liver: Mild increased echogenicity consistent with fatty infiltration. No mass lesion is noted. Portal vein is patent on color Doppler imaging with normal direction of blood flow towards the liver. Other: None. IMPRESSION: Fatty infiltration of the liver. Gallbladder sludge.  No cholelithiasis or wall thickening is noted. Electronically Signed   By: Alcide CleverMark  Lukens M.D.   On: 11/30/2020 12:50    Procedures .Critical Care Performed by: Terrilee FilesButler, Ita Fritzsche C, MD Authorized by: Terrilee FilesButler, Adar Rase C, MD   Critical care provider statement:    Critical care time (minutes):  80   Critical care time was exclusive of:  Separately billable  procedures and treating other patients   Critical care was necessary to treat or prevent imminent or life-threatening deterioration of the following conditions:  Metabolic crisis, respiratory failure and sepsis   Critical care was time spent personally by me on the following activities:  Discussions with consultants, evaluation of patient's response to treatment, examination of patient, ordering and performing treatments and interventions, ordering and review of laboratory studies, ordering and review of radiographic studies, pulse oximetry, re-evaluation of patient's condition, obtaining history from patient or surrogate, review of old charts and development of treatment plan with patient or surrogate     Medications Ordered in ED Medications  insulin regular, human (MYXREDLIN) 100 units/ 100 mL infusion ( Intravenous Infusion Verify 11/30/20 1718)  dextrose 5 % in lactated ringers infusion ( Intravenous Not Given 11/30/20 1220)  Chlorhexidine Gluconate Cloth 2 % PADS 6 each (6 each Topical Given 11/30/20 1233)  gabapentin (NEURONTIN) capsule 100 mg (100 mg Oral Not Given 11/30/20 1524)  heparin injection 5,000 Units (5,000 Units Subcutaneous Given 11/30/20 1234)  dextrose 5 % in lactated ringers infusion (0 mLs Intravenous Hold 11/30/20 1245)  dextrose 50 % solution 0-50 mL  (has no administration in time range)  0.9 %  sodium chloride infusion ( Intravenous Stopped 11/30/20 1646)  ceFEPIme (MAXIPIME) 2 g in sodium chloride 0.9 % 100 mL IVPB ( Intravenous Stopped 11/30/20 1418)  LORazepam (ATIVAN) injection 0.5 mg (0.5 mg Intravenous Given 11/30/20 1434)  methylPREDNISolone sodium succinate (SOLU-MEDROL) 40 mg/mL injection 40 mg (40 mg Intravenous Given 11/30/20 1643)  Ipratropium-Albuterol (COMBIVENT) respimat 1 puff (has no administration in time range)  guaiFENesin-dextromethorphan (ROBITUSSIN DM) 100-10 MG/5ML syrup 10 mL (has no administration in time range)  potassium chloride 10 mEq in 100 mL IVPB (10 mEq Intravenous New Bag/Given 11/30/20 1736)  remdesivir 100 mg in sodium chloride 0.9 % 100 mL IVPB (100 mg Intravenous New Bag/Given 11/30/20 1726)    Followed by  remdesivir 100 mg in sodium chloride 0.9 % 100 mL IVPB (has no administration in time range)  dexmedetomidine (PRECEDEX) 400 MCG/100ML (4 mcg/mL) infusion (0.8 mcg/kg/hr  49 kg Intravenous Infusion Verify 11/30/20 1718)  mupirocin ointment (BACTROBAN) 2 % 1 application (has no administration in time range)  cefTRIAXone (ROCEPHIN) 1 g in sodium chloride 0.9 % 100 mL IVPB (0 g Intravenous Stopped 11/30/20 1016)  azithromycin (ZITHROMAX) 500 mg in sodium chloride 0.9 % 250 mL IVPB (0 mg Intravenous Stopped 11/30/20 1104)  lactated ringers bolus 980 mL (0 mLs Intravenous Stopping Infusion hung by another clincian 11/30/20 1219)  potassium chloride 10 mEq in 100 mL IVPB ( Intravenous Stopped 11/30/20 1548)  sodium chloride 0.9 % bolus 980 mL (0 mLs Intravenous Stopped 11/30/20 1437)  calcium gluconate 1 g/ 50 mL sodium chloride IVPB (0 mg Intravenous Stopped 11/30/20 1348)    ED Course  I have reviewed the triage vital signs and the nursing notes.  Pertinent labs & imaging results that were available during my care of the patient were reviewed by me and considered in my medical decision making (see chart for  details).  Clinical Course as of 11/30/20 1750  Mon Nov 30, 2020  1165 Chest x-ray interpreted by me as multifocal pneumonia. [MB]  1003 Discussed with Dr. Sherryll Burger [facility] hospitalist who will evaluate the patient for admission. [MB]    Clinical Course User Index [MB] Terrilee Files, MD   MDM Rules/Calculators/A&P  Mylin Gignac was evaluated in Emergency Department on 11/30/2020 for the symptoms described in the history of present illness. She was evaluated in the context of the global COVID-19 pandemic, which necessitated consideration that the patient might be at risk for infection with the SARS-CoV-2 virus that causes COVID-19. Institutional protocols and algorithms that pertain to the evaluation of patients at risk for COVID-19 are in a state of rapid change based on information released by regulatory bodies including the CDC and federal and state organizations. These policies and algorithms were followed during the patient's care in the ED.  This patient complains of shortness of breath, generalized weakness; this involves an extensive number of treatment Options and is a complaint that carries with it a high risk of complications and Morbidity. The differential includes COVID, hypoxia, pneumothorax, PE, metabolic derangement, DKA  I ordered, reviewed and interpreted labs, which included fingerstick with elevated glucose, chemistries with normal white count, stable hemoglobin, lactate critically elevated, chemistries with elevated glucose low bicarb low sodium consistent with DKA and pseudohyponatremia, Covid testing positive, beta hydroxybutyrate elevated, LFTs abnormal question from Covid or other etiology I ordered medication IV fluids, IV antibiotics, IV insulin, potassium repletion I ordered imaging studies which included chest x-ray and I independently    visualized and interpreted imaging which showed multifocal pneumonia Additional history obtained from  EMS Previous records obtained and reviewed in epic including recent ED visit I consulted [facility] hospitalist Dr. Clelia Croft and discussed lab and imaging findings  Critical Interventions: Management of patient's hypoxia and DKA with aggressive oxygen supplementation and IV fluids IV insulin IV antibiotics  After the interventions stated above, I reevaluated the patient and found patient to be critically ill.  She will need to be admitted to the hospital for further stabilization.  Condition guarded.  Currently on nonrebreather.   Final Clinical Impression(s) / ED Diagnoses Final diagnoses:  Sepsis with acute hypoxic respiratory failure, due to unspecified organism, unspecified whether septic shock present (HCC)  Diabetic ketoacidosis without coma associated with type 1 diabetes mellitus (HCC)  Elevated LFTs  QT prolongation  Acute hypoxemic respiratory failure due to COVID-19 [facility])    Rx / DC Orders ED Discharge Orders    None       Terrilee Files, MD 11/30/20 1757

## 2020-11-30 NOTE — Progress Notes (Addendum)
Inpatient Diabetes Program Recommendations  AACE/ADA: New Consensus Statement on Inpatient Glycemic Control   Target Ranges:  Prepandial:   less than 140 mg/dL      Peak postprandial:   less than 180 mg/dL (1-2 hours)      Critically ill patients:  140 - 180 mg/dL   Results for JINNY, SWEETLAND (MRN [id number]) as of 11/30/2020 12:17  Ref. Range 11/30/2020 08:46 11/30/2020 10:21 11/30/2020 11:03 11/30/2020 11:34  Glucose-Capillary Latest Ref Range: 70 - 99 mg/dL >517 (HH) >616 (HH) >073 (HH) >600 [facility])  Results for PAMLEA, FINDER (MRN [id number]) as of 11/30/2020 12:17  Ref. Range 11/30/2020 08:56 11/30/2020 08:59  Beta-Hydroxybutyric Acid Latest Ref Range: 0.05 - 0.27 mmol/L  >8.00 (H)  Glucose Latest Ref Range: 70 - 99 mg/dL 5,462 (HH)    Review of Glycemic Control  Diabetes history: DM1 (makes NO insulin; requires basal, correction, and meal coverage insulin) Outpatient Diabetes medications: Lantus 35 units daily, Novolog 1-15 units TID with meals (1 unit per 15 grams of carbs) Current orders for Inpatient glycemic control: IV insulin  Inpatient Diabetes Program Recommendations:    Insulin: IV insulin should be continued until acidosis is completely resolved. Once acidosis is completely resolved and provider ready to transition from IV to SQ insulin, please consider ordering Lantus 15 units Q24H (based on 49 kg x 0.3 units), CBGs Q4H, and Novolog 0-9 units Q4H. If diet ordered and patient eating at least 50% of meals, consider ordering Novolog 3 units TID with meals for meal coverage if patient eats at least 50% of meals.  NOTE: Noted consult for diabetes coordinator. Chart reviewed. Noted patient in Emergency Department and being admitted with DKA. Per chart, initial glucose 1056 mg/dl on 04/18/49, CO2 11, AG 30, and beta-hydroxybutyric acid >8.00. IV insulin should be continued until acidosis resolved.   Thanks, Orlando Penner, RN, MSN, CDE Diabetes Coordinator Inpatient Diabetes  Program [phone] (Team Pager from 8am to 5pm)

## 2020-11-30 NOTE — Progress Notes (Addendum)
Patient has pulled bipap mask off twice and oxygen dropped significantly. Dr. Sherryll Burger notified and order for Precedex drip received. Will continue to monitor.

## 2020-11-30 NOTE — Progress Notes (Signed)
Pt continued to get increasingly agitated. Began swinging arms, attempting to pull bipap mask off, and kicking legs. Bi-pap mask changed to smaller size to fit patient better. Whole linen change given. Patient now resting comfortably with oxygen saturations ranging in the upper 80's and low 90s.

## 2020-11-30 NOTE — Progress Notes (Signed)
elink tracking

## 2020-11-30 NOTE — Progress Notes (Signed)
Pharmacy Antibiotic Note  [name] is a 35 y.o. female admitted on 11/30/2020 with sepsis.  Pharmacy has been consulted for Cefepime dosing.  Plan: Cefepime 2000 mg IV every 12 hours. Monitor labs, c/s, and patient improvement.  Height: 5' (152.4 cm) Weight: 49 kg (108 lb 0.4 oz) IBW/kg (Calculated) : 45.5  Temp (24hrs), Avg:96.1 F (35.6 C), Min:94.6 F (34.8 C), Max:96.8 F (36 C)  Recent Labs  Lab 11/27/20 1958 11/30/20 0856 11/30/20 1129  WBC 21.1* 5.3  --   CREATININE 1.11* 1.15*  --   LATICACIDVEN  --  6.8* 3.5*    Estimated Creatinine Clearance: 48.6 mL/min (A) (by C-G formula based on SCr of 1.15 mg/dL (H)).    Allergies  Allergen Reactions  . Codeine Anaphylaxis and Nausea And Vomiting  . Penicillins Rash    As a teenager  . Tramadol Itching    Antimicrobials this admission: Cefepime 2/14 >>  Azith/CTX 2/14  Microbiology results: 2/14 BCx: pending 2/14 MRSA PCR: pending  Thank you for allowing pharmacy to be a part of this patient's care.  Tad Moore 11/30/2020 12:29 PM

## 2020-11-30 NOTE — ED Notes (Signed)
Date and time results received: 11/30/20 0956 (use smartphrase ".now" to insert current time)  Test: Glucose Critical Value: 1056  Name of Provider Notified: Dr Charm Barges Orders Received? Or Actions Taken?: NA

## 2020-11-30 NOTE — ED Notes (Signed)
ED TO INPATIENT HANDOFF REPORT  ED Nurse Name and Phone #: [phone]  S Name/Age/Gender [name] 35 y.o. female Room/Bed: APA19/APA19  Code Status   Code Status: Prior  Home/SNF/Other Home Patient oriented to: self, place, time and situation Is this baseline? Yes   Triage Complete: Triage complete  Chief Complaint DKA, type 1 (HCC) [E10.10]  Triage Note Pt dx with pneumonia 3 days ago. Pt brought in by EMS due to weakness. Upon arrival sats 66% and pt placed on NRB at 15 L Blood sugar greater than 600    Allergies Allergies  Allergen Reactions  . Codeine Anaphylaxis and Nausea And Vomiting  . Penicillins Rash    As a teenager  . Tramadol Itching    Level of Care/Admitting Diagnosis ED Disposition    ED Disposition Condition Comment   Admit  Hospital Area: [facility] [[id number]]  Level of Care: Stepdown [14]  Covid Evaluation: Asymptomatic Screening Protocol (No Symptoms)  Diagnosis: DKA, type 1 [facility]) [[id number]]  Admitting Physician: Erick Blinks [[id number]]  Attending Physician: Erick Blinks [[id number]]  Estimated length of stay: past midnight tomorrow  Certification:: I certify this patient will need inpatient services for at least 2 midnights       B Medical/Surgery History Past Medical History:  Diagnosis Date  . Acid reflux 2010  . Diabetes mellitus   . DKA 05/01/2011  . Polysubstance abuse [facility])    Past Surgical History:  Procedure Laterality Date  . TUBAL LIGATION    . WOUND EXPLORATION Left 07/26/2014   Procedure: Irrigation, debridement and exploration of elbow wounds;  Surgeon: Betha Loa, MD;  Location: [facility] OR;  Service: Orthopedics;  Laterality: Left;     A IV Location/Drains/Wounds Patient Lines/Drains/Airways Status    Active Line/Drains/Airways    Name Placement date Placement time Site Days   Peripheral IV 11/30/20 Left Antecubital 11/30/20  0849  Antecubital  less than 1   Peripheral IV 11/30/20 Right Wrist 11/30/20   0936  Wrist  less than 1   Peripheral IV 11/30/20 Left Forearm 11/30/20  1028  Forearm  less than 1   External Urinary Catheter 11/30/20  0919  --  less than 1          Intake/Output Last 24 hours  Intake/Output Summary (Last 24 hours) at 11/30/2020 1108 Last data filed at 11/30/2020 1104 Gross per 24 hour  Intake 405.24 ml  Output --  Net 405.24 ml    Labs/Imaging Results for orders placed or performed during the hospital encounter of 11/30/20 (from the past 48 hour(s))  CBG monitoring, ED     Status: Abnormal   Collection Time: 11/30/20  8:46 AM  Result Value Ref Range   Glucose-Capillary >600 (HH) 70 - 99 mg/dL    Comment: Glucose reference range applies only to samples taken after fasting for at least 8 hours.  POC SARS Coronavirus 2 Ag-ED -     Status: None   Collection Time: 11/30/20  8:49 AM  Result Value Ref Range   SARS Coronavirus 2 Ag NEGATIVE NEGATIVE    Comment: (NOTE) SARS-CoV-2 antigen NOT DETECTED.   Negative results are presumptive.  Negative results do not preclude SARS-CoV-2 infection and should not be used as the sole basis for treatment or other patient management decisions, including infection  control decisions, particularly in the presence of clinical signs and  symptoms consistent with COVID-19, or in those who have been in contact with the virus.  Negative results must be  combined with clinical observations, patient history, and epidemiological information. The expected result is Negative.  Fact Sheet for Patients: https://www.jennings-kim.com/  Fact Sheet for Healthcare Providers: https://alexander-rogers.biz/  This test is not yet approved or cleared by the [place] FDA and  has been authorized for detection and/or diagnosis of SARS-CoV-2 by FDA under an Emergency Use Authorization (EUA).  This EUA will remain in effect (meaning this test can be used) for the duration of  the COV ID-19 declaration under Section  564(b)(1) of the Act, 21 U.S.C. section 360bbb-3(b)(1), unless the authorization is terminated or revoked sooner.    Blood Culture (routine x 2)     Status: None (Preliminary result)   Collection Time: 11/30/20  8:49 AM   Specimen: BLOOD LEFT ARM  Result Value Ref Range   Specimen Description BLOOD LEFT ARM DRAWN BY RN    Special Requests      BOTTLES DRAWN AEROBIC AND ANAEROBIC Blood Culture results may not be optimal due to an inadequate volume of blood received in culture bottles   Culture      NO GROWTH <12 HOURS Performed at [facility], [address] [place], [place] [address]    Report Status PENDING   Resp Panel by RT-PCR (Flu A&B, Covid) Nasopharyngeal Swab     Status: Abnormal   Collection Time: 11/30/20  8:56 AM   Specimen: Nasopharyngeal Swab; Nasopharyngeal(NP) swabs in vial transport medium  Result Value Ref Range   SARS Coronavirus 2 by RT PCR POSITIVE (A) NEGATIVE    Comment: RESULT CALLED TO, READ BACK BY AND VERIFIED WITH: Valree Feild H. AT 1049AM ON [id number] BY THOMPSON S. (NOTE) SARS-CoV-2 target nucleic acids are DETECTED.  The SARS-CoV-2 RNA is generally detectable in upper respiratory specimens during the acute phase of infection. Positive results are indicative of the presence of the identified virus, but do not rule out bacterial infection or co-infection with other pathogens not detected by the test. Clinical correlation with patient history and other diagnostic information is necessary to determine patient infection status. The expected result is Negative.  Fact Sheet for Patients: BloggerCourse.com  Fact Sheet for Healthcare Providers: SeriousBroker.it  This test is not yet approved or cleared by the [place] FDA and  has been authorized for detection and/or diagnosis of SARS-CoV-2 by FDA under an Emergency Use Authorization (EUA).  This EUA will remain in effect (meaning thi s test can be used)  for the duration of  the COVID-19 declaration under Section 564(b)(1) of the Act, 21 U.S.C. section 360bbb-3(b)(1), unless the authorization is terminated or revoked sooner.     Influenza A by PCR NEGATIVE NEGATIVE   Influenza B by PCR NEGATIVE NEGATIVE    Comment: (NOTE) The Xpert Xpress SARS-CoV-2/FLU/RSV plus assay is intended as an aid in the diagnosis of influenza from Nasopharyngeal swab specimens and should not be used as a sole basis for treatment. Nasal washings and aspirates are unacceptable for Xpert Xpress SARS-CoV-2/FLU/RSV testing.  Fact Sheet for Patients: BloggerCourse.com  Fact Sheet for Healthcare Providers: SeriousBroker.it  This test is not yet approved or cleared by the [place] FDA and has been authorized for detection and/or diagnosis of SARS-CoV-2 by FDA under an Emergency Use Authorization (EUA). This EUA will remain in effect (meaning this test can be used) for the duration of the COVID-19 declaration under Section 564(b)(1) of the Act, 21 U.S.C. section 360bbb-3(b)(1), unless the authorization is terminated or revoked.  Performed at [facility], [address] [place], [place] [address]  Lactic acid, plasma     Status: Abnormal   Collection Time: 11/30/20  8:56 AM  Result Value Ref Range   Lactic Acid, Venous 6.8 (HH) 0.5 - 1.9 mmol/L    Comment: CRITICAL RESULT CALLED TO, READ BACK BY AND VERIFIED WITH: VOGLER,H AT 9:45AM ON 11/30/20 BY [facility] Performed at [facility], [address], [place], [place] [address]   CBC WITH DIFFERENTIAL     Status: Abnormal   Collection Time: 11/30/20  8:56 AM  Result Value Ref Range   WBC 5.3 4.0 - 10.5 K/uL   RBC 4.14 3.87 - 5.11 MIL/uL   Hemoglobin 12.2 12.0 - 15.0 g/dL   HCT 63.8 75.6 - 43.3 %   MCV 96.4 80.0 - 100.0 fL   MCH 29.5 26.0 - 34.0 pg   MCHC 30.6 30.0 - 36.0 g/dL   RDW 29.5 (H) 18.8 - 41.6 %   Platelets 273 150 - 400 K/uL   nRBC 0.4  (H) 0.0 - 0.2 %   Neutrophils Relative % 87 %   Neutro Abs 4.6 1.7 - 7.7 K/uL   Lymphocytes Relative 8 %   Lymphs Abs 0.4 (L) 0.7 - 4.0 K/uL   Monocytes Relative 2 %   Monocytes Absolute 0.1 0.1 - 1.0 K/uL   Eosinophils Relative 0 %   Eosinophils Absolute 0.0 0.0 - 0.5 K/uL   Basophils Relative 2 %   Basophils Absolute 0.1 0.0 - 0.1 K/uL   WBC Morphology INCREASED BANDS (>20% BANDS)     Comment: TOXIC GRANULATION   Immature Granulocytes 1 %   Abs Immature Granulocytes 0.07 0.00 - 0.07 K/uL    Comment: Performed at [facility], [address] [place], [place] [address]  Comprehensive metabolic panel     Status: Abnormal   Collection Time: 11/30/20  8:56 AM  Result Value Ref Range   Sodium 123 (L) 135 - 145 mmol/L    Comment: RESULT REPEATED AND VERIFIED   Potassium 3.1 (L) 3.5 - 5.1 mmol/L   Chloride 82 (L) 98 - 111 mmol/L   CO2 11 (L) 22 - 32 mmol/L   Glucose, Bld 1,056 (HH) 70 - 99 mg/dL    Comment: Glucose reference range applies only to samples taken after fasting for at least 8 hours. CRITICAL RESULT CALLED TO, READ BACK BY AND VERIFIED WITH: Emmylou Bieker,H AT 9:40AM ON 11/30/20 BY FESTERMAN,C    BUN 22 (H) 6 - 20 mg/dL   Creatinine, Ser 1.60 (H) 0.44 - 1.00 mg/dL   Calcium 7.0 (L) 8.9 - 10.3 mg/dL   Total Protein 6.2 (L) 6.5 - 8.1 g/dL   Albumin 2.6 (L) 3.5 - 5.0 g/dL   AST 109 (H) 15 - 41 U/L   ALT 299 (H) 0 - 44 U/L   Alkaline Phosphatase 748 (H) 38 - 126 U/L   Total Bilirubin 1.7 (H) 0.3 - 1.2 mg/dL   GFR, Estimated >32 >35 mL/min    Comment: (NOTE) Calculated using the CKD-EPI Creatinine Equation (2021)    Anion gap 30 (H) 5 - 15    Comment: Performed at [facility], [address], [place], [place] [address]  D-dimer, quantitative     Status: Abnormal   Collection Time: 11/30/20  8:56 AM  Result Value Ref Range   D-Dimer, Quant 3.05 (H) 0.00 - 0.50 ug/mL-FEU    Comment: (NOTE) At the manufacturer cut-off value of 0.5 g/mL FEU, this assay has a negative  predictive value of 95-100%.This assay is intended for use in  conjunction with a clinical pretest probability (PTP) assessment model to exclude pulmonary embolism (PE) and deep venous thrombosis (DVT) in outpatients suspected of PE or DVT. Results should be correlated with clinical presentation. Performed at [facility], [address] [place], [place] [id number]   Procalcitonin     Status: None   Collection Time: 11/30/20  8:56 AM  Result Value Ref Range   Procalcitonin 32.32 ng/mL    Comment:        Interpretation: PCT >= 10 ng/mL: Important systemic inflammatory response, almost exclusively due to severe bacterial sepsis or septic shock. (NOTE)       Sepsis PCT Algorithm           Lower Respiratory Tract                                      Infection PCT Algorithm    ----------------------------     ----------------------------         PCT < 0.25 ng/mL                PCT < 0.10 ng/mL          Strongly encourage             Strongly discourage   discontinuation of antibiotics    initiation of antibiotics    ----------------------------     -----------------------------       PCT 0.25 - 0.50 ng/mL            PCT 0.10 - 0.25 ng/mL               OR       >80% decrease in PCT            Discourage initiation of                                            antibiotics      Encourage discontinuation           of antibiotics    ----------------------------     -----------------------------         PCT >= 0.50 ng/mL              PCT 0.26 - 0.50 ng/mL                AND       <80% decrease in PCT             Encourage initiation of                                             antibiotics       Encourage continuation           of antibiotics    ----------------------------     -----------------------------        PCT >= 0.50 ng/mL                  PCT > 0.50 ng/mL               AND         increase in PCT  Strongly encourage                                       initiation of antibiotics    Strongly encourage escalation           of antibiotics                                     -----------------------------                                           PCT <= 0.25 ng/mL                                                 OR                                        > 80% decrease in PCT                                      Discontinue / Do not initiate                                             antibiotics  Performed at [facility], [address] [place], [place] [address]   Lactate dehydrogenase     Status: Abnormal   Collection Time: 11/30/20  8:56 AM  Result Value Ref Range   LDH 500 (H) 98 - 192 U/L    Comment: Performed at [facility], [address], [place], [place] [address]  Ferritin     Status: Abnormal   Collection Time: 11/30/20  8:56 AM  Result Value Ref Range   Ferritin 528 (H) 11 - 307 ng/mL    Comment: Performed at [facility], [address], [place], [place] [address]  Triglycerides     Status: Abnormal   Collection Time: 11/30/20  8:56 AM  Result Value Ref Range   Triglycerides 360 (H) <150 mg/dL    Comment: Performed at [facility], [address], [place], [place] [address]  Fibrinogen     Status: Abnormal   Collection Time: 11/30/20  8:56 AM  Result Value Ref Range   Fibrinogen >800 (H) 210 - 475 mg/dL    Comment: Performed at [facility], [address] [place], [place] [address]  C-reactive protein     Status: Abnormal   Collection Time: 11/30/20  8:56 AM  Result Value Ref Range   CRP 19.5 (H) <1.0 mg/dL    Comment: Performed at [facility], [address] [place], [place] [address]  Magnesium     Status: Abnormal   Collection Time: 11/30/20  8:56 AM  Result Value Ref Range   Magnesium 2.9 (H) 1.7 - 2.4 mg/dL    Comment: Performed at [facility], [address], [place], [place] [address]  Beta-hydroxybutyric acid     Status: Abnormal   Collection Time: 11/30/20  8:59 AM  Result Value Ref Range    Beta-Hydroxybutyric Acid >8.00 (H) 0.05 - 0.27 mmol/L    Comment: Performed at [facility], [address], [place], [place] [address]  hCG, serum, qualitative     Status: None   Collection Time: 11/30/20  9:02 AM  Result Value Ref Range   Preg, Serum NEGATIVE NEGATIVE    Comment:        THE SENSITIVITY OF THIS METHODOLOGY IS >10 mIU/mL. Performed at [facility], [address] [place], [place] [address]   Blood gas, venous     Status: Abnormal   Collection Time: 11/30/20  9:20 AM  Result Value Ref Range   FIO2 80.00    pH, Ven 7.247 (L) 7.250 - 7.430   pCO2, Ven 27.0 (L) 44.0 - 60.0 mmHg   pO2, Ven 52.7 (H) 32.0 - 45.0 mmHg   Bicarbonate 13.3 (L) 20.0 - 28.0 mmol/L   Acid-base deficit 14.5 (H) 0.0 - 2.0 mmol/L   O2 Saturation 84.1 %   Patient temperature 35.0     Comment: Performed at [facility], [address] [place], [place] [address]  Blood Culture (routine x 2)     Status: None (Preliminary result)   Collection Time: 11/30/20  9:21 AM   Specimen: BLOOD LEFT FOREARM  Result Value Ref Range   Specimen Description BLOOD LEFT FOREARM    Special Requests      BOTTLES DRAWN AEROBIC AND ANAEROBIC Blood Culture adequate volume   Culture      NO GROWTH <12 HOURS Performed at [facility], [address], [place], [place] [address]    Report Status PENDING   Urinalysis, Routine w reflex microscopic Urine, Clean Catch     Status: Abnormal   Collection Time: 11/30/20  9:49 AM  Result Value Ref Range   Color, Urine STRAW (A) YELLOW   APPearance CLEAR CLEAR   Specific Gravity, Urine 1.022 1.005 - 1.030   pH 5.0 5.0 - 8.0   Glucose, UA >=500 (A) NEGATIVE mg/dL   Hgb urine dipstick MODERATE (A) NEGATIVE   Bilirubin Urine NEGATIVE NEGATIVE   Ketones, ur 20 (A) NEGATIVE mg/dL   Protein, ur NEGATIVE NEGATIVE mg/dL   Nitrite NEGATIVE NEGATIVE   Leukocytes,Ua NEGATIVE NEGATIVE   RBC / HPF 0-5 0 - 5 RBC/hpf   WBC, UA 0-5 0 - 5 WBC/hpf   Bacteria, UA NONE SEEN NONE SEEN    Squamous Epithelial / LPF 0-5 0 - 5   Mucus PRESENT     Comment: Performed at [facility], [address] [place], [place] [address]  CBG monitoring, ED     Status: Abnormal   Collection Time: 11/30/20 10:21 AM  Result Value Ref Range   Glucose-Capillary >600 (HH) 70 - 99 mg/dL    Comment: Glucose reference range applies only to samples taken after fasting for at least 8 hours.  CBG monitoring, ED     Status: Abnormal   Collection Time: 11/30/20 11:03 AM  Result Value Ref Range   Glucose-Capillary >600 (HH) 70 - 99 mg/dL    Comment: Glucose reference range applies only to samples taken after fasting for at least 8 hours.   DG Chest Port 1 View  Result Date: 11/30/2020 CLINICAL DATA:  Shortness of breath, recent pneumonia in a 35 year old female EXAM: PORTABLE CHEST 1 VIEW COMPARISON:  November 28, 2020 FINDINGS:  Interstitial and airspace disease worse than on the prior study, superimposed on a background of pulmonary emphysema. Interval development of a small RIGHT-sided pleural effusion. Trachea midline. Cardiomediastinal contours and hilar structures are stable. Nodular component to the airspace process with focal areas of nodularity the for example in the RIGHT upper and LEFT lower lobe. No acute skeletal process on limited assessment. IMPRESSION: 1. Worsening interstitial and airspace disease superimposed on a background of pulmonary emphysema. Findings suspicious for multifocal pneumonia. Given nodular component would suggest follow-up to ensure resolution and exclude underlying pulmonary nodules. 2. New small RIGHT-sided pleural effusion. Could also consider the possibility of an overlay of heart failure. Electronically Signed   By: Donzetta Kohut M.D.   On: 11/30/2020 09:21    Pending Labs Unresulted Labs (From admission, onward)          Start     Ordered   11/30/20 0856  Lactic acid, plasma  Now then every 2 hours,   STAT      11/30/20 0856          Vitals/Pain Today's  Vitals   11/30/20 1015 11/30/20 1030 11/30/20 1045 11/30/20 1054  BP: 127/76 115/71 136/79   Pulse: (!) 102 (!) 104 100   Resp: 20 (!) 23 18   Temp:      TempSrc:      SpO2: (!) 89% (!) 86% (!) 86% (!) 88%  Weight:      Height:      PainSc:        Isolation Precautions Airborne and Contact precautions  Medications Medications  0.9 %  sodium chloride infusion (1,000 mLs Intravenous New Bag/Given 11/30/20 0941)  insulin regular, human (MYXREDLIN) 100 units/ 100 mL infusion (6 Units/hr Intravenous Rate/Dose Verify 11/30/20 1104)  lactated ringers infusion ( Intravenous New Bag/Given 11/30/20 1030)  dextrose 5 % in lactated ringers infusion (has no administration in time range)  dextrose 50 % solution 0-50 mL (has no administration in time range)  potassium chloride 10 mEq in 100 mL IVPB (10 mEq Intravenous New Bag/Given 11/30/20 1032)  cefTRIAXone (ROCEPHIN) 1 g in sodium chloride 0.9 % 100 mL IVPB (0 g Intravenous Stopped 11/30/20 1016)  azithromycin (ZITHROMAX) 500 mg in sodium chloride 0.9 % 250 mL IVPB (0 mg Intravenous Stopped 11/30/20 1104)  lactated ringers bolus 980 mL (1,000 mLs Intravenous New Bag/Given 11/30/20 1029)    Mobility walks Low fall risk   Focused Assessments    R Recommendations: See Admitting Provider Note  Report given to:   Additional Notes:

## 2020-11-30 NOTE — Progress Notes (Signed)
At bedside with patient. With no stimulation pt began thrashing around, pulled bipap off, and immediately desatted to 5%. Another nurse called to bedside to hold patient while bipap was placed back on. Oxygen slowly increased, however breathing remained agonal like. Dr. Sherryll Burger notified and gave verbal order to intubate. Dr. Estell Harpin from ED to bedside to intubate. 20 mg of etomidate given at 1905. 100 mg of succinylcholine given subsequently. 7.0 ETT successfully placed at 1908. 22 at the lip. 16 Fr. Orogastric tube placed. 14 Fr. Foley placed. Awaiting orders for sedation.

## 2020-11-30 NOTE — ED Notes (Signed)
Respiratory called and informed pt O2 sat upon arrival 66%, on NR now with sats 88%.

## 2020-11-30 NOTE — ED Notes (Signed)
Respiratory at bedside.

## 2020-12-01 ENCOUNTER — Inpatient Hospital Stay (HOSPITAL_COMMUNITY): Payer: Medicaid Other

## 2020-12-01 DIAGNOSIS — E1010 Type 1 diabetes mellitus with ketoacidosis without coma: Secondary | ICD-10-CM

## 2020-12-01 DIAGNOSIS — J189 Pneumonia, unspecified organism: Secondary | ICD-10-CM | POA: Diagnosis not present

## 2020-12-01 DIAGNOSIS — U071 COVID-19: Secondary | ICD-10-CM | POA: Diagnosis not present

## 2020-12-01 DIAGNOSIS — J9602 Acute respiratory failure with hypercapnia: Secondary | ICD-10-CM

## 2020-12-01 DIAGNOSIS — R6521 Severe sepsis with septic shock: Secondary | ICD-10-CM | POA: Diagnosis present

## 2020-12-01 DIAGNOSIS — J9601 Acute respiratory failure with hypoxia: Secondary | ICD-10-CM | POA: Diagnosis not present

## 2020-12-01 DIAGNOSIS — J1282 Pneumonia due to coronavirus disease 2019: Secondary | ICD-10-CM

## 2020-12-01 DIAGNOSIS — J918 Pleural effusion in other conditions classified elsewhere: Secondary | ICD-10-CM

## 2020-12-01 DIAGNOSIS — J9600 Acute respiratory failure, unspecified whether with hypoxia or hypercapnia: Secondary | ICD-10-CM | POA: Diagnosis present

## 2020-12-01 DIAGNOSIS — A419 Sepsis, unspecified organism: Secondary | ICD-10-CM | POA: Diagnosis present

## 2020-12-01 LAB — C-REACTIVE PROTEIN: CRP: 9.1 mg/dL — ABNORMAL HIGH (ref <1.0)

## 2020-12-01 LAB — GLUCOSE, CAPILLARY
Glucose-Capillary: 153 mg/dL — ABNORMAL HIGH (ref 70–99)
Glucose-Capillary: 165 mg/dL — ABNORMAL HIGH (ref 70–99)
Glucose-Capillary: 165 mg/dL — ABNORMAL HIGH (ref 70–99)
Glucose-Capillary: 171 mg/dL — ABNORMAL HIGH (ref 70–99)
Glucose-Capillary: 177 mg/dL — ABNORMAL HIGH (ref 70–99)
Glucose-Capillary: 182 mg/dL — ABNORMAL HIGH (ref 70–99)
Glucose-Capillary: 191 mg/dL — ABNORMAL HIGH (ref 70–99)
Glucose-Capillary: 192 mg/dL — ABNORMAL HIGH (ref 70–99)
Glucose-Capillary: 198 mg/dL — ABNORMAL HIGH (ref 70–99)
Glucose-Capillary: 200 mg/dL — ABNORMAL HIGH (ref 70–99)
Glucose-Capillary: 203 mg/dL — ABNORMAL HIGH (ref 70–99)
Glucose-Capillary: 218 mg/dL — ABNORMAL HIGH (ref 70–99)
Glucose-Capillary: 54 mg/dL — ABNORMAL LOW (ref 70–99)
Glucose-Capillary: 61 mg/dL — ABNORMAL LOW (ref 70–99)
Glucose-Capillary: 79 mg/dL (ref 70–99)
Glucose-Capillary: 79 mg/dL (ref 70–99)
Glucose-Capillary: 84 mg/dL (ref 70–99)

## 2020-12-01 LAB — CBC WITH DIFFERENTIAL/PLATELET
Abs Immature Granulocytes: 0.18 10*3/uL — ABNORMAL HIGH (ref 0.00–0.07)
Basophils Absolute: 0 10*3/uL (ref 0.0–0.1)
Basophils Relative: 0 %
Eosinophils Absolute: 0 10*3/uL (ref 0.0–0.5)
Eosinophils Relative: 0 %
HCT: 29.4 % — ABNORMAL LOW (ref 36.0–46.0)
Hemoglobin: 10.2 g/dL — ABNORMAL LOW (ref 12.0–15.0)
Immature Granulocytes: 2 %
Lymphocytes Relative: 11 %
Lymphs Abs: 0.9 10*3/uL (ref 0.7–4.0)
MCH: 29.7 pg (ref 26.0–34.0)
MCHC: 34.7 g/dL (ref 30.0–36.0)
MCV: 85.5 fL (ref 80.0–100.0)
Monocytes Absolute: 0.2 10*3/uL (ref 0.1–1.0)
Monocytes Relative: 3 %
Neutro Abs: 7.3 10*3/uL (ref 1.7–7.7)
Neutrophils Relative %: 84 %
Platelets: 167 10*3/uL (ref 150–400)
RBC: 3.44 MIL/uL — ABNORMAL LOW (ref 3.87–5.11)
RDW: 15.3 % (ref 11.5–15.5)
WBC: 8.7 10*3/uL (ref 4.0–10.5)
nRBC: 0 % (ref 0.0–0.2)

## 2020-12-01 LAB — COMPREHENSIVE METABOLIC PANEL
ALT: 173 U/L — ABNORMAL HIGH (ref 0–44)
AST: 101 U/L — ABNORMAL HIGH (ref 15–41)
Albumin: 1.6 g/dL — ABNORMAL LOW (ref 3.5–5.0)
Alkaline Phosphatase: 438 U/L — ABNORMAL HIGH (ref 38–126)
Anion gap: 6 (ref 5–15)
BUN: 21 mg/dL — ABNORMAL HIGH (ref 6–20)
CO2: 24 mmol/L (ref 22–32)
Calcium: 6.6 mg/dL — ABNORMAL LOW (ref 8.9–10.3)
Chloride: 107 mmol/L (ref 98–111)
Creatinine, Ser: 0.67 mg/dL (ref 0.44–1.00)
GFR, Estimated: >60 mL/min (ref >60)
Glucose, Bld: 192 mg/dL — ABNORMAL HIGH (ref 70–99)
Potassium: 3.8 mmol/L (ref 3.5–5.1)
Sodium: 137 mmol/L (ref 135–145)
Total Bilirubin: 0.4 mg/dL (ref 0.3–1.2)
Total Protein: 4.8 g/dL — ABNORMAL LOW (ref 6.5–8.1)

## 2020-12-01 LAB — BLOOD GAS, ARTERIAL
Acid-Base Excess: 1.3 mmol/L (ref 0.0–2.0)
Acid-Base Excess: 1.9 mmol/L (ref 0.0–2.0)
Bicarbonate: 24.8 mmol/L (ref 20.0–28.0)
Bicarbonate: 25.9 mmol/L (ref 20.0–28.0)
FIO2: 100
FIO2: 100
O2 Saturation: 98.5 %
O2 Saturation: 99 %
Patient temperature: 37.6
Patient temperature: 37
pCO2 arterial: 56.4 mmHg — ABNORMAL HIGH (ref 32.0–48.0)
pCO2 arterial: 45.3 mmHg (ref 32.0–48.0)
pH, Arterial: 7.303 — ABNORMAL LOW (ref 7.350–7.450)
pH, Arterial: 7.385 (ref 7.350–7.450)
pO2, Arterial: 193 mmHg — ABNORMAL HIGH (ref 83.0–108.0)
pO2, Arterial: 242 mmHg — ABNORMAL HIGH (ref 83.0–108.0)

## 2020-12-01 LAB — BODY FLUID CELL COUNT WITH DIFFERENTIAL
Lymphs, Fluid: 1 %
Monocyte-Macrophage-Serous Fluid: 2 % — ABNORMAL LOW (ref 50–90)
Neutrophil Count, Fluid: 97 % — ABNORMAL HIGH (ref 0–25)
Total Nucleated Cell Count, Fluid: 14410 cu mm — ABNORMAL HIGH (ref 0–1000)

## 2020-12-01 LAB — FERRITIN: Ferritin: 323 ng/mL — ABNORMAL HIGH (ref 11–307)

## 2020-12-01 LAB — GRAM STAIN

## 2020-12-01 LAB — PROCALCITONIN: Procalcitonin: 15.87 ng/mL

## 2020-12-01 LAB — HEMOGLOBIN A1C
Hgb A1c MFr Bld: 14.7 % — ABNORMAL HIGH (ref 4.8–5.6)
Mean Plasma Glucose: 375 mg/dL

## 2020-12-01 LAB — MAGNESIUM: Magnesium: 2.3 mg/dL (ref 1.7–2.4)

## 2020-12-01 LAB — LACTATE DEHYDROGENASE, PLEURAL OR PERITONEAL FLUID: LD, Fluid: 3704 U/L — ABNORMAL HIGH (ref 3–23)

## 2020-12-01 LAB — ALBUMIN, PLEURAL OR PERITONEAL FLUID: Albumin, Fluid: 1 g/dL

## 2020-12-01 LAB — PROTEIN, PLEURAL OR PERITONEAL FLUID: Total protein, fluid: <3 g/dL

## 2020-12-01 LAB — BETA-HYDROXYBUTYRIC ACID: Beta-Hydroxybutyric Acid: 0.05 mmol/L (ref 0.05–0.27)

## 2020-12-01 MED ORDER — CHLORHEXIDINE GLUCONATE 0.12% ORAL RINSE (MEDLINE KIT)
15.0000 mL | Freq: Two times a day (BID) | OROMUCOSAL | Status: DC
  Administered 2020-12-01 – 2020-12-02 (×4): 15 mL via OROMUCOSAL

## 2020-12-01 MED ORDER — INSULIN ASPART 100 UNIT/ML ~~LOC~~ SOLN
0.0000 [IU] | Freq: Three times a day (TID) | SUBCUTANEOUS | Status: DC
  Administered 2020-12-01: 3 [IU] via SUBCUTANEOUS
  Administered 2020-12-02: 2 [IU] via SUBCUTANEOUS
  Administered 2020-12-02 (×2): 5 [IU] via SUBCUTANEOUS
  Administered 2020-12-03: 3 [IU] via SUBCUTANEOUS
  Administered 2020-12-03: 5 [IU] via SUBCUTANEOUS
  Administered 2020-12-03: 2 [IU] via SUBCUTANEOUS

## 2020-12-01 MED ORDER — ORAL CARE MOUTH RINSE
15.0000 mL | OROMUCOSAL | Status: DC
  Administered 2020-12-01 – 2020-12-03 (×22): 15 mL via OROMUCOSAL

## 2020-12-01 MED ORDER — NOREPINEPHRINE 4 MG/250ML-% IV SOLN
0.0000 ug/min | INTRAVENOUS | Status: DC
  Administered 2020-12-01: 2 ug/min via INTRAVENOUS
  Filled 2020-12-01: qty 250

## 2020-12-01 MED ORDER — ENOXAPARIN SODIUM 30 MG/0.3ML ~~LOC~~ SOLN
30.0000 mg | SUBCUTANEOUS | Status: DC
  Administered 2020-12-01: 30 mg via SUBCUTANEOUS
  Filled 2020-12-01: qty 0.3

## 2020-12-01 MED ORDER — GUAIFENESIN-DM 100-10 MG/5ML PO SYRP: 10.0000 mL | ORAL_SOLUTION | ORAL | Status: DC | PRN

## 2020-12-01 MED ORDER — LACTATED RINGERS IV SOLN: INTRAVENOUS | Status: DC

## 2020-12-01 MED ORDER — ACETAMINOPHEN 325 MG PO TABS
650.0000 mg | ORAL_TABLET | Freq: Four times a day (QID) | ORAL | Status: DC | PRN
  Administered 2020-12-01: 650 mg via ORAL
  Filled 2020-12-01: qty 2

## 2020-12-01 MED ORDER — FAMOTIDINE IN NACL 20-0.9 MG/50ML-% IV SOLN
20.0000 mg | Freq: Two times a day (BID) | INTRAVENOUS | Status: DC
  Administered 2020-12-01 – 2020-12-11 (×20): 20 mg via INTRAVENOUS
  Filled 2020-12-01 (×20): qty 50

## 2020-12-01 MED ORDER — ACETAMINOPHEN 160 MG/5ML PO SOLN
650.0000 mg | Freq: Four times a day (QID) | ORAL | Status: DC | PRN
  Administered 2020-12-02: 650 mg
  Filled 2020-12-01: qty 20.3

## 2020-12-01 MED ORDER — ACETAMINOPHEN 325 MG PO TABS
650.0000 mg | ORAL_TABLET | Freq: Four times a day (QID) | ORAL | Status: DC | PRN
  Administered 2020-12-12 (×2): 650 mg via ORAL
  Filled 2020-12-01 (×2): qty 2

## 2020-12-01 MED ORDER — INSULIN ASPART 100 UNIT/ML ~~LOC~~ SOLN
0.0000 [IU] | Freq: Every day | SUBCUTANEOUS | Status: DC
  Administered 2020-12-06: 3 [IU] via SUBCUTANEOUS

## 2020-12-01 MED ORDER — VANCOMYCIN HCL IN DEXTROSE 1-5 GM/200ML-% IV SOLN
1000.0000 mg | Freq: Once | INTRAVENOUS | Status: AC
  Administered 2020-12-01: 1000 mg via INTRAVENOUS
  Filled 2020-12-01: qty 200

## 2020-12-01 MED ORDER — VANCOMYCIN HCL 500 MG/100ML IV SOLN
500.0000 mg | Freq: Two times a day (BID) | INTRAVENOUS | Status: DC
  Administered 2020-12-02 – 2020-12-04 (×5): 500 mg via INTRAVENOUS
  Filled 2020-12-01 (×5): qty 100

## 2020-12-01 MED ORDER — SODIUM CHLORIDE 0.9 % IV SOLN
2.0000 g | Freq: Three times a day (TID) | INTRAVENOUS | Status: DC
  Administered 2020-12-01 – 2020-12-04 (×8): 2 g via INTRAVENOUS
  Filled 2020-12-01 (×8): qty 2

## 2020-12-01 MED ORDER — INSULIN GLARGINE 100 UNIT/ML ~~LOC~~ SOLN
10.0000 [IU] | Freq: Every evening | SUBCUTANEOUS | Status: DC
  Administered 2020-12-01 – 2020-12-03 (×3): 10 [IU] via SUBCUTANEOUS
  Filled 2020-12-01 (×6): qty 0.1

## 2020-12-01 MED ORDER — INSULIN ASPART 100 UNIT/ML ~~LOC~~ SOLN
14.0000 [IU] | Freq: Once | SUBCUTANEOUS | Status: AC
  Administered 2020-12-01: 14 [IU] via SUBCUTANEOUS

## 2020-12-01 MED ORDER — INSULIN GLARGINE 100 UNIT/ML ~~LOC~~ SOLN
10.0000 [IU] | Freq: Every evening | SUBCUTANEOUS | Status: DC
  Filled 2020-12-01: qty 0.1

## 2020-12-01 NOTE — Consult Note (Signed)
NAME:  [name], MRN:  [id number], DOB:  Dec 25, 1985, LOS: 1 ADMISSION DATE:  11/30/2020, CONSULTATION DATE:  12/01/2020  REFERRING MD:  Gwendolyn Lima, CHIEF COMPLAINT: Respiratory failure requiring mechanical ventilation  Brief History:  35 year old with history of IDDM and cocaine use, admitted with pneumonia and due to Covid infection and DKA, worsening hypoxia on 2/14 requiring mechanical ventilation  History of Present Illness:  History is obtained from chart review since patient is currently intubated and sedated. She has a history of active cocaine use and prior episodes of DKA.  She is reported to be vaccinated for Covid but not boosted. ED visit on 2/12 for nausea and vomiting, chest x-ray showed bilateral bronchopneumonia CT abdomen showed masslike consolidation right upper and lower lobe with trace right effusion, but no intra-abdominal process.  She was discharged with cefdinir and azithromycin. She returned on 2/14 via EMS with hypoxia, saturation 66% on room air and CBGs more than 600.  She required nonrebreather in the ED, Covid PCR was positive, initial labs showed lactate of 6.8 and DKA with CBG greater than 1000.  LFTs were elevated.  Prolonged QT was noted on EKG. she admitted to cocaine use 3 days prior to admission, surprisingly urine toxicology was negative She was aggressively resuscitated with fluids and IV insulin, started on remdesivir and Solu-Medrol and admitted to the ICU right upper quadrant ultrasound did not show any active process She transition from nonrebreather to heated high flow oxygen to BiPAP and was eventually intubated on 2/14 for hypoxic/hypercarbic respiratory failure    Past Medical History:  IDDM GERD Cocaine use  Significant Hospital Events:  2/14 intubated  Consults:    Procedures:  ETT 2/14 >> RIJ 2/15 >> [place] thora 2/15 >>  Significant Diagnostic Tests:  CT abdomen 2/12 masslike consolidation right upper and lower lobe with trace  right effusion, stenosis of celiac artery similar to that noted in 2018  RUQ [place] 2/14 >> no CBD dilatation, fatty liver  Micro Data:  [facility] 2/14 >> Pleural fluid 2/15 >> MRSA PCR POS resp 2/15 >>  Antimicrobials:  Cefepime 2/14 >> vanc 2/15 >>   Interim History / Subjective:    Objective   Blood pressure 92/63, pulse 88, temperature 99.8 F (37.7 C), temperature source Axillary, resp. rate (!) 30, height 5' (1.524 m), weight 49 kg, last menstrual period 11/25/2020, SpO2 95 %.    Vent Mode: PRVC FiO2 (%):  [40 %-100 %] 40 % Set Rate:  [18 bmp-30 bmp] 30 bmp Vt Set:  [420 mL-440 mL] 440 mL PEEP:  [5 cmH20-8 cmH20] 5 cmH20 Plateau Pressure:  [15 cmH20-25 cmH20] 21 cmH20   Intake/Output Summary (Last 24 hours) at 12/01/2020 1345 Last data filed at 12/01/2020 1307 Gross per 24 hour  Intake 4833.11 ml  Output 2125 ml  Net 2708.11 ml   Filed Weights   11/30/20 0846  Weight: 49 kg    Examination: General: Young woman, intubated and sedated, no distress HENT: Mild pallor, no icterus, no JVD Lungs: Bilateral ventilated breath sounds, plateau 25 Cardiovascular: S1-S2 tacky, sinus on monitor Abdomen: Soft, nontender, no organomegaly Extremities: No deformity, no track marks on skin Neuro: Sedated, RASS -3 GU: Clear urine  Chest x-ray independently reviewed from 2/14 and 2/15 which shows ET tube in position and layering right effusion  Resolved Hospital Problem list     Assessment & Plan:  Her presentation seems to be more compatible with multilobar bronchopneumonia with a right parapneumonic effusion, that seems to have  increased from initial presentation on 2/12.  Not sure if she took the antibiotics that were prescribed in the ED. not clear at this time whether Covid pneumonia is contributing or whether this is incidental.  Elevated procalcitonin and left shift is explained by bacterial infection   Acute hypoxic/hypercarbic respiratory failure Mild ARDS range hypoxia -P/F  ratio is much improved, able to dial down FiO2 to 50%, leave PEEP at eight, does not need proning -Plateau pressure 25 acceptable, driving pressure slight high at 17 -No need for paralytic -She appears to be on the dry side already with soft blood pressure  Community-acquired pneumonia -treating as HAP with cefepime, add vancomycin Check respiratory culture, urine strep antigen for completion Right parapneumonic effusion -proceed with ultrasound-guided thoracentesis   COVID-19 positive test (U07.1, COVID-19) with Acute Pneumonia (J12.89, Other viral pneumonia) -Remdesivir and steroids per triad service -Not a candidate for Actemra due to ongoing infection   DKA -beta hydroxybutyrate and anion gap seems to have resolved. Can discontinue dextrose drip, start tube feeds, once sugars come down to less than 180 range, can transition off IV insulin -Expect that Solu-Medrol will still drive up her sugars  Elevated LFTs -unclear cause , ultrasound showed fatty liver -Trend  Acute metabolic encephalopathy -due to hypoxia and hypercarbia, admitted to cocaine use 3 days ago although UDS was negative -Once ready to wean can transition from Versed to Precedex, may be limited by soft blood pressure -Use fentanyl as needed, goal RASS -1  Best practice (evaluated daily)  Diet: Tube feeds Pain/Anxiety/Delirium protocol (if indicated): Versed/fentanyl VAP protocol (if indicated): Y DVT prophylaxis: Lovenox GI prophylaxis: Protonix Glucose control: Insulin drip Mobility: Bedrest Disposition: ICU  Goals of Care:   Code Status: Full code  Labs   CBC: Recent Labs  Lab 11/27/20 1958 11/30/20 0856 11/30/20 1340 12/01/20 0700  WBC 21.1* 5.3 6.3 8.7  NEUTROABS  --  4.6  --  7.3  HGB 14.4 12.2 11.6* 10.2*  HCT 43.0 39.9 34.7* 29.4*  MCV 87.8 96.4 88.7 85.5  PLT 273 273 195 167    Basic Metabolic Panel: Recent Labs  Lab 11/30/20 0856 11/30/20 1340 11/30/20 1632 11/30/20 2125  12/01/20 0700 12/01/20 0900  NA 123* 129* 134* 136  --  137  K 3.1* 2.6* 2.9* 3.7  --  3.8  CL 82* 97* 99 107  --  107  CO2 11* 18* 23 20*  --  24  GLUCOSE 1,056* 623* 448* 268*  --  192*  BUN 22* 19 17 16   --  21*  CREATININE 1.15* 0.98 0.80 0.68  --  0.67  CALCIUM 7.0* 6.8* 7.0* 6.6*  --  6.6*  MG 2.9*  --   --   --  2.3  --    GFR: Estimated Creatinine Clearance: 69.8 mL/min (by C-G formula based on SCr of 0.67 mg/dL). Recent Labs  Lab 11/27/20 1958 11/30/20 0856 11/30/20 1129 11/30/20 1340 11/30/20 1809 11/30/20 2125 12/01/20 0700  PROCALCITON  --  32.32  --   --   --   --  15.87  WBC 21.1* 5.3  --  6.3  --   --  8.7  LATICACIDVEN  --  6.8* 3.5*  --  2.1* 2.7*  --     Liver Function Tests: Recent Labs  Lab 11/27/20 1958 11/30/20 0856 12/01/20 0900  AST 100* 246* 101*  ALT 130* 299* 173*  ALKPHOS 139* 748* 438*  BILITOT 0.8 1.7* 0.4  PROT 7.2 6.2* 4.8*  ALBUMIN 3.1* 2.6* 1.6*   Recent Labs  Lab 11/27/20 1958  LIPASE 18   No results for input(s): AMMONIA in the last 168 hours.  ABG    Component Value Date/Time   PHART 7.385 12/01/2020 0610   PCO2ART 45.3 12/01/2020 0610   PO2ART 242 (H) 12/01/2020 0610   HCO3 25.9 12/01/2020 0610   TCO2 38 (H) 04/06/2020 1528   ACIDBASEDEF 0.3 11/30/2020 2008   O2SAT 99.0 12/01/2020 0610     Coagulation Profile: No results for input(s): INR, PROTIME in the last 168 hours.  Cardiac Enzymes: No results for input(s): CKTOTAL, CKMB, CKMBINDEX, TROPONINI in the last 168 hours.  HbA1C: Hgb A1c MFr Bld  Date/Time Value Ref Range Status  11/30/2020 01:40 PM 14.7 (H) 4.8 - 5.6 % Final    Comment:    (NOTE)         Prediabetes: 5.7 - 6.4         Diabetes: >6.4         Glycemic control for adults with diabetes: <7.0   04/06/2020 07:48 PM 14.1 (H) 4.8 - 5.6 % Final    Comment:    (NOTE)         Prediabetes: 5.7 - 6.4         Diabetes: >6.4         Glycemic control for adults with diabetes: <7.0      CBG: Recent Labs  Lab 12/01/20 1100 12/01/20 1101 12/01/20 1207 12/01/20 1208 12/01/20 1320  GLUCAP 61* 153* 84 198* 177*    Review of Systems:   Unable to obtain since intubated and sedated  Past Medical History:  She,  has a past medical history of Acid reflux (2010), Diabetes mellitus, DKA (05/01/2011), and Polysubstance abuse (HCC).   Surgical History:   Past Surgical History:  Procedure Laterality Date  . TUBAL LIGATION    . WOUND EXPLORATION Left 07/26/2014   Procedure: Irrigation, debridement and exploration of elbow wounds;  Surgeon: Betha Loa, MD;  Location: [facility];  Service: Orthopedics;  Laterality: Left;     Social History:   reports that she has been smoking cigarettes. She has a 15.00 pack-year smoking history. She has never used smokeless tobacco. She reports current drug use. Drug: Cocaine. She reports that she does not drink alcohol.   Family History:  Her family history includes Coronary artery disease in her mother; Diabetes type I in her daughter; Heart attack (age of onset: 58) in her father; Lung cancer in her mother; Thyroid disease in her maternal grandmother.   Allergies Allergies  Allergen Reactions  . Codeine Anaphylaxis and Nausea And Vomiting  . Penicillins Rash    As a teenager  . Tramadol Itching     Home Medications  Prior to Admission medications   Medication Sig Start Date End Date Taking? Authorizing Provider  azithromycin (ZITHROMAX) 250 MG tablet Take 2 tablets together on the first day, then 1 every day until finished. 11/28/20  Yes Sabas Sous, MD  cefdinir (OMNICEF) 300 MG capsule Take 1 capsule (300 mg total) by mouth 2 (two) times daily for 7 days. 11/28/20 12/05/20 Yes Bero, Elmer Sow, MD  gabapentin (NEURONTIN) 300 MG capsule Take 300 mg by mouth 3 (three) times daily.  08/24/18  Yes [provider]  insulin aspart (NOVOLOG FLEXPEN) 100 UNIT/ML FlexPen Inject 1-15 Units into the skin 3 (three) times daily with  meals. Dose based on CBG and carb count, 1 unit for every 15 carb.  Yes [provider]  Insulin Glargine (LANTUS SOLOSTAR) 100 UNIT/ML Solostar Pen Inject 30 Units into the skin daily at 10 pm. Patient taking differently: Inject 35 Units into the skin daily. 06/27/19 04/06/20 Yes Pricilla Loveless, MD  linaclotide [facility]) 290 MCG CAPS capsule Take 290 mcg by mouth daily as needed (for constipation).    Yes [provider]  omeprazole (PRILOSEC) 40 MG capsule Take 40 mg by mouth daily before breakfast.    Yes [provider]  tiotropium (SPIRIVA) 18 MCG inhalation capsule Place 18 mcg into inhaler and inhale daily.   Yes [provider]  cephALEXin (KEFLEX) 500 MG capsule Take 1 capsule (500 mg total) by mouth 4 (four) times daily. Patient not taking: Reported on 12/01/2020 09/03/20   Elpidio Anis, PA-C  magic mouthwash w/lidocaine SOLN Take 10 mLs by mouth 4 (four) times daily as needed for mouth pain. Patient not taking: No sig reported 03/10/19   Mullis, Kiersten P, DO  naproxen (NAPROSYN) 500 MG tablet Take 1 tablet (500 mg total) by mouth 2 (two) times daily. 11/28/20   Sabas Sous, MD  ondansetron (ZOFRAN ODT) 4 MG disintegrating tablet Take 1 tablet (4 mg total) by mouth every 8 (eight) hours as needed for nausea or vomiting. Patient not taking: Reported on 12/01/2020 04/07/20   Mirian Mo, MD  ondansetron (ZOFRAN) 4 MG tablet Take 1 tablet (4 mg total) by mouth daily as needed for nausea or vomiting. Patient not taking: Reported on 12/01/2020 04/07/20 04/07/21  Mirian Mo, MD  phenol (CHLORASEPTIC) 1.4 % LIQD Use as directed 1 spray in the mouth or throat as needed for throat irritation / pain. Patient not taking: No sig reported 03/10/19   Mullis, Kiersten P, DO  polyethylene glycol (MIRALAX / GLYCOLAX) 17 g packet Take 17 g by mouth daily. Patient not taking: Reported on 12/01/2020 04/08/20   Mirian Mo, MD     Critical care time: 43     Cyril Mourning MD. [facility].  [facility] Pager : [phone]  If no response to pager , please call [phone] until 7 pm After 7:00 pm call Elink  [phone]   12/01/2020

## 2020-12-01 NOTE — Progress Notes (Addendum)
Pharmacy Antibiotic Note  [name] is a 35 y.o. female admitted on 11/30/2020 with sepsis/PNA.   Pharmacy has been consulted for Cefepime and Vancomycin dosing. CrCL improved Plan: Vancomycin 1000mg  IV loading dose, then 500mg  IV q12h, AUC 400, expect some accumulation Increase Cefepime 2000 mg IV every 8 hours. Monitor labs, c/s, and patient improvement.  Height: 5' (152.4 cm) Weight: 49 kg (108 lb 0.4 oz) IBW/kg (Calculated) : 45.5  Temp (24hrs), Avg:99.7 F (37.6 C), Min:97.9 F (36.6 C), Max:101.5 F (38.6 C)  Recent Labs  Lab 11/27/20 1958 11/30/20 0856 11/30/20 1129 11/30/20 1340 11/30/20 1632 11/30/20 1809 11/30/20 2125 12/01/20 0700 12/01/20 0900  WBC 21.1* 5.3  --  6.3  --   --   --  8.7  --   CREATININE 1.11* 1.15*  --  0.98 0.80  --  0.68  --  0.67  LATICACIDVEN  --  6.8* 3.5*  --   --  2.1* 2.7*  --   --     Estimated Creatinine Clearance: 69.8 mL/min (by C-G formula based on SCr of 0.67 mg/dL).    Allergies  Allergen Reactions  . Codeine Anaphylaxis and Nausea And Vomiting  . Penicillins Rash    As a teenager  . Tramadol Itching    Antimicrobials this admission: Remdesivir 2/14>> Solumedrol 2/15>> Cefepime 2/14 >>  Vancomycin 2/15> Azith/CTX 2/14 x 1 dose each  Microbiology results: 2/14 BCx: ngtd 2/14 MRSA PCR: positive 2/14 SARS-2 CV is positive  Thank you for allowing pharmacy to be a part of this patient's care.  3/14, BS 3/14, Elder Cyphers Clinical Pharmacist Pager [phone] 12/01/2020 2:04 PM

## 2020-12-01 NOTE — Sedation Documentation (Signed)
Right sided thoracentesis procedure performed by Dr. Tyron Russell at bedside today and 15 mL of clear yellow fluid removed. Post portable chest xray ordered at completion of procedure and fluids given to ICU nurse to process for labs.

## 2020-12-01 NOTE — Progress Notes (Addendum)
-   Spoke with pt's husband-- got consent from Husband for Diagnostic and Therapeutic Thoracentesis---  This procedure has been fully reviewed with the patient's husabnd and Verbal/Phone informed consent has been obtained.  Shon Hale, MD

## 2020-12-01 NOTE — Progress Notes (Signed)
Inpatient Diabetes Program Recommendations  AACE/ADA: New Consensus Statement on Inpatient Glycemic Control   Target Ranges:  Prepandial:   less than 140 mg/dL      Peak postprandial:   less than 180 mg/dL (1-2 hours)      Critically ill patients:  140 - 180 mg/dL   Results for [name], [name] (MRN [id number]) as of 12/01/2020 07:43  Ref. Range 12/01/2020 01:02 12/01/2020 02:23 12/01/2020 03:32 12/01/2020 04:34 12/01/2020 06:51  Glucose-Capillary Latest Ref Range: 70 - 99 mg/dL 314 (H) 970 (H) 263 (H) 165 (H) 203 (H)  Results for [name], [name] (MRN [id number]) as of 12/01/2020 07:43  Ref. Range 11/30/2020 08:56 11/30/2020 08:59  Beta-Hydroxybutyric Acid Latest Ref Range: 0.05 - 0.27 mmol/L  >8.00 (H)  Glucose Latest Ref Range: 70 - 99 mg/dL 7,412 (HH)    Review of Glycemic Control  Diabetes history: DM1 (makes NO insulin; requires basal, correction, and meal coverage insulin) Outpatient Diabetes medications: Lantus 35 units daily, Novolog 1-15 units TID with meals (1 unit per 15 grams of carbs) Current orders for Inpatient glycemic control: IV insulin; Solumedrol 40 mg Q12H  Inpatient Diabetes Program Recommendations:    Insulin: Once acidosis is completely resolved and provider ready to transition from IV to SQ insulin, if steroids will be continued please consider ordering Lantus 25 units Q24H, CBGs Q4H, and Novolog 0-9 units Q4H.   NOTE: Per chart, noted patient was intubated yesterday evening and steroids were also started which are contributing to hyperglycemia and increased insulin needs.   Thanks, Orlando Penner, RN, MSN, CDE Diabetes Coordinator Inpatient Diabetes Program [phone] (Team Pager from 8am to 5pm)

## 2020-12-01 NOTE — Progress Notes (Signed)
CBG 79. Dr. Mariea Clonts at bedside. Verbal order received to increase D5LR to 200 mL/hr and decrease insulin gtt to 2 units/hr. Recheck in 30 minutes.

## 2020-12-01 NOTE — Progress Notes (Signed)
eLink Physician-Brief Progress Note Patient Name: [name] DOB: 10-Sep-1986 MRN: [id number]   Date of Service  12/01/2020  HPI/Events of Note  ABG on 100%/PRVC 29/TV 440/P 8 = 7.385/45.3/242.  eICU Interventions  Plan: 1. Continue present ventilator management.  2. Wean FiO2 as tolerated.      Intervention Category Major Interventions: Respiratory failure - evaluation and management  Arif Amendola Eugene 12/01/2020, 6:45 AM

## 2020-12-01 NOTE — Procedures (Signed)
PreOperative Dx: RT pleural effusion Postoperative Dx: RT pleural effusion Procedure:   US guided RT thoracentesis Radiologist:  Tyron Russell Anesthesia:  10 ml of 1% lidocaine Specimen:  16 mL of sl cloudy yellow colored fluid EBL:   < 1 ml Complications: None

## 2020-12-01 NOTE — Procedures (Signed)
Central Venous Catheter Insertion Procedure Note  [name]  [id number]  1986-03-12  Date:12/01/20  Time:1:43 PM   Provider Performing:Milicent Acheampong V. Vassie Loll   Procedure: Insertion of Non-tunneled Central Venous [phone]) with US guidance ([id number])   Indication(s) Difficult access  Consent Risks of the procedure as well as the alternatives and risks of each were explained to the patient and/or caregiver.  Consent for the procedure was obtained and is signed in the bedside chart  Anesthesia Topical only with 1% lidocaine   Timeout Verified patient identification, verified procedure, site/side was marked, verified correct patient position, special equipment/implants available, medications/allergies/relevant history reviewed, required imaging and test results available.  Sterile Technique Maximal sterile technique including full sterile barrier drape, hand hygiene, sterile gown, sterile gloves, mask, hair covering, sterile ultrasound probe cover (if used).  Procedure Description Area of catheter insertion was cleaned with chlorhexidine and draped in sterile fashion.  With real-time ultrasound guidance a central venous catheter was placed into the right internal jugular vein. Nonpulsatile blood flow and easy flushing noted in all ports.  The catheter was sutured in place and sterile dressing applied.  Complications/Tolerance None; patient tolerated the procedure well. Chest X-ray verified placement for internal jugular position.     EBL Minimal  Specimen(s) None  [name] V. Vassie Loll MD

## 2020-12-01 NOTE — Progress Notes (Addendum)
Patient Demographics:    [name], is a 35 y.o. female, DOB - [birth year], VOH:[id number]  Admit date - 11/30/2020   Admitting Physician Pratik Darleen Crocker, DO  Outpatient Primary MD for the patient is [facility] [place]  LOS - 1   Chief Complaint  Patient presents with  . Weakness       . hypoxia        Subjective:    [name] today remains intubated and sedated --Had hypotension requiring IV Levophed for pressure support  Assessment  & Plan :    Principal Problem:   Severe sepsis with septic shock -secondary to community-acquired pneumonia with parapneumonic effusion Active Problems:   DKA, type 1 (HCC)   Pneumonia due to COVID-19 virus   Acute Hypoxic respiratory failure due to COVID-19 ([place])   Sepsis secondary to community-acquired pneumonia with parapneumonic effusion   CAP (community acquired pneumonia)--- with parapneumonic effusion   Benzodiazepine abuse ([place])   Cocaine abuse ([place])   Substance induced mood disorder ([place])   Substance abuse ([place])   Tobacco abuse   Brief Summary:- 35 y.o. female with medical history significant for type 1 diabetes, GERD, and cocaine use admitted on 12/01/2020 with pathophysiology consistent with DKA-as well as AKI and acute hypoxic respiratory failure secondary to Covid pneumonia with transaminitis and electrolyte abnormalities, and found to have severe sepsis with shock secondary to bacterial pneumonia superimposed on Covid pneumonia -- A/p 1)Acute hypoxic respiratory failure secondary to COVID-19 infection/Pneumonia--- The treatment plan and use of medications  for treatment of COVID-19 infection and possible side effects were discussed with pts Husband -----[name] verbalizes understanding and agrees to treatment protocols   --Patient is positive for COVID-19 infection, chest x-ray with findings of infiltrates/opacities,   patient is tachypneic/hypoxic and requiring intubation and sedation  ---patient meets criteria for initiation of Remdesivir AND Steroid therapy per protocol  --Check and trend inflammatory markers including D-dimer, ferritin and  CRP---also follow CBC and CMP -Follow serial chest x-rays and ABGs as indicated --Attempt to maintain euvolemic state --Zinc and vitamin C as ordered -Albuterol inhaler as needed -Accu-Cheks/fingersticks while on high-dose steroids -PPI while on high-dose steroids -Currently not a candidate for Actemra due to concerns about acute superimposed bacterial infection -12/01/20 -Patient remains intubated and sedated-continue fentanyl, Precedex and Versed COVID-19 Labs  Recent Labs    11/30/20 0856 12/01/20 0700  DDIMER 3.05*  --   FERRITIN 528* 323*  LDH 500*  --   CRP 19.5* 9.1*    Lab Results  Component Value Date   SARSCOV2NAA POSITIVE (A) 11/30/2020   [place] NEGATIVE 04/06/2020   [place] NEGATIVE 03/08/2019   [place] NOT DETECTED 02/28/2019   2)Severe sepsis with septic shock secondary to community-acquired pneumonia with parapneumonic effusion--- POA--patient met sepsis criteria on admission with fevers, tachycardia, tachypnea, as well as profound hypoxia and hypotension as well as lactic acidosis -Status post diagnostic right-sided ultrasound thoracentesis on 12/01/2020 -Continue IV cefepime and vancomycin pending blood cultures and pleural fluid studies -Leukocytosis and elevated PCT noted -Continue IV Levophed for pressure support -PCCM consult from Dr. Kara Mead appreciated  3)DKA-DKA pathophysiology appears to have resolved, -Bicarb is up to 24 from 11 on admission -Anion gap is down to 6 from 30 on  admission -Blood glucose is down to 192 from 1056 on admission -Okay to transition from IV insulin to subcu insulin -Change D5 to LR  4)-Prophylaxis-Protonix for GI prophylaxis Lovenox for DVT prophylaxis  5) elevated LFTs--- liver  ultrasound with fatty liver otherwise no acute findings  Hepatic Function Latest Ref Rng & Units 12/01/2020 11/30/2020 11/27/2020  Total Protein 6.5 - 8.1 g/dL 4.8(L) 6.2(L) 7.2  Albumin 3.5 - 5.0 g/dL 9.5(V) 2.6(L) 3.1(L)  AST 15 - 41 U/L 101(H) 246(H) 100(H)  ALT 0 - 44 U/L 173(H) 299(H) 130(H)  Alk Phosphatase 38 - 126 U/L 438(H) 748(H) 139(H)  Total Bilirubin 0.3 - 1.2 mg/dL 0.4 2.0(E) 0.8  Bilirubin, Direct 0.0 - 0.2 mg/dL - - -    6) hyponatremia/hypokalemia--resolved with replacement and hydration  7)COPD/tobacco abuse--- bronchodilators-ordered patient currently intubated and sedated  8) polysubstance abuse--- patient admitted to using cocaine 3 days ago UDS at this time is already negative  9)Social/Ethics--patient has a husband and 2 kids, husband is primary contact and primary decision maker- -patient's husband will keep on it for members updated on patient's condition and other questions -Patient remains a full code  CRITICAL CARE Performed by: Shon Hale   Total critical care time: 46 minutes  Critical care time was exclusive of separately billable procedures and treating other patients.  Vent Settings:- PRVC/40%/5/30/440  Critical care was necessary to treat or prevent imminent or life-threatening deterioration.  Critical care was time spent personally by me on the following activities: development of treatment plan with patient and/or surrogate as well as nursing, discussions with consultants, evaluation of patient's response to treatment, examination of patient, obtaining history from patient or surrogate, ordering and performing treatments and interventions, ordering and review of laboratory studies, ordering and review of radiographic studies, pulse oximetry and re-evaluation of patient's condition.  Disposition/Need for in-Hospital Stay- patient unable to be discharged at this time due to  -Severe sepsis with septic shock secondary to community-acquired  pneumonia with parapneumonic effusion-and DKA as well as Covid pneumonia requiring IV antibiotics, IV fluids, IV pressors, intubation and sedation  Status is: Inpatient  Remains inpatient appropriate because:Hemodynamically unstable -Please see above  Disposition: The patient is from: Home              Anticipated d/c is to: Home              Anticipated d/c date is: > 3 days              Patient currently is not medically stable to d/c. Barriers: Not Clinically Stable-   Code Status :  -  Code Status: Full Code   Family Communication:   I called and updated patient's husband by phone  Consults  :  PCCM/IR  Procedures:- -Intubation on 11/30/2020 -Status post diagnostic right-sided ultrasound thoracentesis on 12/01/2020 -Right IJ central line placement on 12/01/2020  DVT Prophylaxis  :   - SCDs   enoxaparin (LOVENOX) injection 30 mg Start: 12/01/20 1800    Lab Results  Component Value Date   PLT 167 12/01/2020    Inpatient Medications  Scheduled Meds: . chlorhexidine gluconate (MEDLINE KIT)  15 mL Mouth Rinse BID  . Chlorhexidine Gluconate Cloth  6 each Topical Daily  . enoxaparin (LOVENOX) injection  30 mg Subcutaneous Q24H  . insulin aspart  0-15 Units Subcutaneous TID WC  . insulin aspart  0-5 Units Subcutaneous QHS  . insulin aspart  14 Units Subcutaneous Once  . insulin glargine  10 Units Subcutaneous  QPM  . mouth rinse  15 mL Mouth Rinse 10 times per day  . methylPREDNISolone (SOLU-MEDROL) injection  40 mg Intravenous Q12H  . mupirocin ointment  1 application Nasal BID   Continuous Infusions: . ceFEPime (MAXIPIME) IV    . dexmedetomidine (PRECEDEX) IV infusion Stopped (11/30/20 2041)  . famotidine (PEPCID) IV Stopped (12/01/20 1029)  . fentaNYL infusion INTRAVENOUS 50 mcg/hr (12/01/20 1402)  . insulin 2.8 mL/hr at 12/01/20 1402  . lactated ringers    . midazolam 1 mg/hr (12/01/20 1402)  . norepinephrine (LEVOPHED) Adult infusion 2 mcg/min (12/01/20 1402)  .  remdesivir 100 mg in NS 100 mL Stopped (12/01/20 0930)  . vancomycin     Followed by  . [START ON 12/02/2020] vancomycin     PRN Meds:.acetaminophen (TYLENOL) oral liquid 160 mg/5 mL **OR** acetaminophen, dextrose, guaiFENesin-dextromethorphan, Ipratropium-Albuterol, LORazepam    Anti-infectives (From admission, onward)   Start     Dose/Rate Route Frequency Ordered Stop   12/02/20 0315  vancomycin (VANCOREADY) IVPB 500 mg/100 mL       "Followed by" Linked Group Details   500 mg 100 mL/hr over 60 Minutes Intravenous Every 12 hours 12/01/20 1423     12/01/20 2000  ceFEPIme (MAXIPIME) 2 g in sodium chloride 0.9 % 100 mL IVPB        2 g 200 mL/hr over 30 Minutes Intravenous Every 8 hours 12/01/20 1423     12/01/20 1500  vancomycin (VANCOCIN) IVPB 1000 mg/200 mL premix       "Followed by" Linked Group Details   1,000 mg 200 mL/hr over 60 Minutes Intravenous  Once 12/01/20 1423     12/01/20 1000  remdesivir 100 mg in sodium chloride 0.9 % 100 mL IVPB       "Followed by" Linked Group Details   100 mg 200 mL/hr over 30 Minutes Intravenous Daily 11/30/20 1556 12/05/20 0959   11/30/20 1645  remdesivir 100 mg in sodium chloride 0.9 % 100 mL IVPB       "Followed by" Linked Group Details   100 mg 200 mL/hr over 30 Minutes Intravenous Every 30 min 11/30/20 1556 11/30/20 1756   11/30/20 1200  ceFEPIme (MAXIPIME) 2 g in sodium chloride 0.9 % 100 mL IVPB  Status:  Discontinued        2 g 200 mL/hr over 30 Minutes Intravenous Every 12 hours 11/30/20 1126 12/01/20 1423   11/30/20 0930  cefTRIAXone (ROCEPHIN) 1 g in sodium chloride 0.9 % 100 mL IVPB        1 g 200 mL/hr over 30 Minutes Intravenous  Once 11/30/20 0926 11/30/20 1016   11/30/20 0930  azithromycin (ZITHROMAX) 500 mg in sodium chloride 0.9 % 250 mL IVPB        500 mg 250 mL/hr over 60 Minutes Intravenous  Once 11/30/20 0926 11/30/20 1104        Objective:   Vitals:   12/01/20 1400 12/01/20 1415 12/01/20 1430 12/01/20 1445  BP:  117/75 115/75 119/76 123/78  Pulse:      Resp: (!) 30 (!) 30 (!) 30 (!) 30  Temp:      TempSrc:      SpO2: 96% 96% 96% 97%  Weight:      Height:        Wt Readings from Last 3 Encounters:  11/30/20 49 kg  11/27/20 49.9 kg  04/07/20 47.6 kg     Intake/Output Summary (Last 24 hours) at 12/01/2020 1459 Last data filed at 12/01/2020 1402  Gross per 24 hour  Intake 5077.41 ml  Output 1325 ml  Net 3752.41 ml     Physical Exam  Gen:-Intubated and sedated HEENT:- [place].AT, No sclera icterus Neck-right IJ central line,.  Lungs-diminished breath sounds bilaterally with few scattered rhonchi CV- S1, S2 normal, regular  Abd-  +ve B.Sounds, Abd Soft,ND,    Extremity/Skin:- No  edema, pedal pulses present  NeuroPsych--Limited exam as patient is currently intubated and sedated GU-Foley in situ   Data Review:   Micro Results Recent Results (from the past 240 hour(s))  Blood Culture (routine x 2)     Status: None (Preliminary result)   Collection Time: 11/30/20  8:49 AM   Specimen: BLOOD LEFT ARM  Result Value Ref Range Status   Specimen Description BLOOD LEFT ARM DRAWN BY RN  Final   Special Requests   Final    BOTTLES DRAWN AEROBIC AND ANAEROBIC Blood Culture results may not be optimal due to an inadequate volume of blood received in culture bottles   Culture   Final    NO GROWTH < 24 HOURS Performed at [facility], [address] [place], [place] [address]    Report Status PENDING  Incomplete  Resp Panel by RT-PCR (Flu A&B, Covid) Nasopharyngeal Swab     Status: Abnormal   Collection Time: 11/30/20  8:56 AM   Specimen: Nasopharyngeal Swab; Nasopharyngeal(NP) swabs in vial transport medium  Result Value Ref Range Status   SARS Coronavirus 2 by RT PCR POSITIVE (A) NEGATIVE Final    Comment: RESULT CALLED TO, READ BACK BY AND VERIFIED WITH: CRAWFORD H. AT 1049AM ON [id number] BY THOMPSON S. (NOTE) SARS-CoV-2 target nucleic acids are DETECTED.  The SARS-CoV-2 RNA is generally  detectable in upper respiratory specimens during the acute phase of infection. Positive results are indicative of the presence of the identified virus, but do not rule out bacterial infection or co-infection with other pathogens not detected by the test. Clinical correlation with patient history and other diagnostic information is necessary to determine patient infection status. The expected result is Negative.  Fact Sheet for Patients: EntrepreneurPulse.com.au  Fact Sheet for Healthcare Providers: IncredibleEmployment.be  This test is not yet approved or cleared by the [place] FDA and  has been authorized for detection and/or diagnosis of SARS-CoV-2 by FDA under an Emergency Use Authorization (EUA).  This EUA will remain in effect (meaning thi s test can be used) for the duration of  the COVID-19 declaration under Section 564(b)(1) of the Act, 21 U.S.C. section 360bbb-3(b)(1), unless the authorization is terminated or revoked sooner.     Influenza A by PCR NEGATIVE NEGATIVE Final   Influenza B by PCR NEGATIVE NEGATIVE Final    Comment: (NOTE) The Xpert Xpress SARS-CoV-2/FLU/RSV plus assay is intended as an aid in the diagnosis of influenza from Nasopharyngeal swab specimens and should not be used as a sole basis for treatment. Nasal washings and aspirates are unacceptable for Xpert Xpress SARS-CoV-2/FLU/RSV testing.  Fact Sheet for Patients: EntrepreneurPulse.com.au  Fact Sheet for Healthcare Providers: IncredibleEmployment.be  This test is not yet approved or cleared by the [place] FDA and has been authorized for detection and/or diagnosis of SARS-CoV-2 by FDA under an Emergency Use Authorization (EUA). This EUA will remain in effect (meaning this test can be used) for the duration of the COVID-19 declaration under Section 564(b)(1) of the Act, 21 U.S.C. section 360bbb-3(b)(1), unless the  authorization is terminated or revoked.  Performed at [facility], [address], [place],  [place] [address]   Blood Culture (routine x 2)     Status: None (Preliminary result)   Collection Time: 11/30/20  9:21 AM   Specimen: BLOOD LEFT FOREARM  Result Value Ref Range Status   Specimen Description BLOOD LEFT FOREARM  Final   Special Requests   Final    BOTTLES DRAWN AEROBIC AND ANAEROBIC Blood Culture adequate volume   Culture   Final    NO GROWTH < 24 HOURS Performed at [facility], [address], [place], [place] [address]    Report Status PENDING  Incomplete  MRSA PCR Screening     Status: Abnormal   Collection Time: 11/30/20 12:30 PM   Specimen: Nasopharyngeal  Result Value Ref Range Status   MRSA by PCR POSITIVE (A) NEGATIVE Final    Comment:        The GeneXpert MRSA Assay (FDA approved for NASAL specimens only), is one component of a comprehensive MRSA colonization surveillance program. It is not intended to diagnose MRSA infection nor to guide or monitor treatment for MRSA infections. RESULT CALLED TO, READ BACK BY AND VERIFIED WITH: ASHLEY SHELTON 2/14 @ 7782 BY [place]ar Haw Performed at [facility], [address] [place], [place] [address]    Radiology Reports CT ABDOMEN PELVIS W CONTRAST  Result Date: 11/28/2020 CLINICAL DATA:  Abdominal pain, biliary obstruction suspected. EXAM: CT ABDOMEN AND PELVIS WITH CONTRAST TECHNIQUE: Multidetector CT imaging of the abdomen and pelvis was performed using the standard protocol following bolus administration of intravenous contrast. CONTRAST:  158mL OMNIPAQUE IOHEXOL 300 MG/ML  SOLN COMPARISON:  CT abdomen pelvis 04/10/2017, CT angio chest 05/18/2017 FINDINGS: Lower chest: Partially visualized trace right pleural effusion. Associated passive atelectasis of the right lower lobe. Dependent right lower lobe consolidation. Masslike right upper and right lower lobe consolidations (4:3, 5:33). Hepatobiliary: The liver measures at  upper limits of normal in size. No focal liver abnormality. No gallstones, gallbladder wall thickening, or pericholecystic fluid. No biliary dilatation. Pancreas: No focal lesion. Normal pancreatic contour. No surrounding inflammatory changes. No main pancreatic ductal dilatation. Spleen: Normal in size without focal abnormality. Adrenals/Urinary Tract: No adrenal nodule bilaterally. Bilateral kidneys enhance symmetrically. No hydronephrosis. No hydroureter. The urinary bladder is unremarkable. Stomach/Bowel: Fluid level within the gastric lumen likely related to ingested material. Stomach is within normal limits. Prominent but not dilated loop of small bowel within the lower anterior abdomen with no definite transition point. No pneumatosis. No evidence of bowel wall thickening or dilatation. Appendix appears normal. Vascular/Lymphatic: No abdominal aorta or iliac aneurysm. 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 No abdominal, pelvic, or inguinal lymphadenopathy. 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 Uterus and bilateral adnexa are unremarkable. Other: Trace simple free fluid within the pelvis that could be physiologic in etiology. No intraperitoneal free gas. No organized fluid collection. Musculoskeletal: No acute or significant osseous findings. IMPRESSION: 1. Dependent right lower lobe consolidation. Masslike right upper and right lower lobe consolidation. Findings likely represents infection/inflammation. Recommend repeat chest x-ray PA and lateral in 3-4 weeks to exclude underlying malignancy. 2. Trace volume right pleural effusion. 3. 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 Electronically Signed   By: Iven Finn M.D.   On: 11/28/2020 01:35   DG Chest Portable 1 View  Result Date: 12/01/2020 CLINICAL DATA:  RIGHT pleural effusion, COVID-19, sepsis EXAM: PORTABLE CHEST 1 VIEW COMPARISON:  Portable exam  at 1345 hrs compared to 0921 hrs FINDINGS: Tip of endotracheal  tube projects 1.9 cm above carina. NG tube extends into stomach. Stable heart size and mediastinal contours normal. Persistent diffuse pulmonary infiltrates unchanged. Persistent small RIGHT pleural effusion. No pneumothorax following RIGHT thoracentesis. IMPRESSION: No pneumothorax following diagnostic RIGHT thoracentesis. Persistent pulmonary infiltrates and small RIGHT pleural effusion. Electronically Signed   By: Lavonia Dana M.D.   On: 12/01/2020 14:20   DG CHEST PORT 1 VIEW  Result Date: 12/01/2020 CLINICAL DATA:  35 year old ventilated female status post central line placement. EXAM: PORTABLE CHEST 1 VIEW COMPARISON:  Chest x-ray 11/30/2020. FINDINGS: An endotracheal tube is in place with tip 3.3 cm above the carina. There is a right-sided internal jugular central venous catheter with tip terminating in the distal superior vena cava. Widespread areas of ill-defined opacities and areas of interstitial prominence are noted throughout the lungs bilaterally, most evident throughout the mid to lower lungs, minimally improved compared to the prior examination. Moderate right and small left pleural effusions. No pneumothorax. Pulmonary vasculature is largely obscured. Heart size is normal. The patient is rotated to the left on today's exam, resulting in distortion of the mediastinal contours and reduced diagnostic sensitivity and specificity for mediastinal pathology. IMPRESSION: 1. Support apparatus, as above. 2. The appearance of the lungs is most compatible with severe multilobar bilateral pneumonia related to known COVID infection. Overall, aeration has slightly improved compared to the prior study. 3. Moderate right and small left pleural effusions similar to the prior examination. Electronically Signed   By: Vinnie Langton M.D.   On: 12/01/2020 09:37   DG CHEST PORT 1 VIEW  Result Date: 11/30/2020 CLINICAL DATA:  COVID-19 positive, tobacco  abuse, intubated EXAM: PORTABLE CHEST 1 VIEW COMPARISON:  11/30/2020, 11/28/2020 FINDINGS: Single frontal view of the chest demonstrates interval placement of endotracheal tube overlying tracheal air column, tip 1.7 cm above carina. Enteric catheter passes below diaphragm tip excluded by collimation, side port projecting over the gastric body. Multifocal bilateral ground-glass airspace disease has progressed since prior study, which could reflect progressive pneumonia or superimposed edema. Stable right pleural effusion. No pneumothorax. IMPRESSION: 1. Support devices as above. 2. Progressive diffuse ground-glass airspace disease, which may reflect worsening pneumonia or superimposed edema. 3. Stable right pleural effusion. Electronically Signed   By: Randa Ngo M.D.   On: 11/30/2020 20:07   DG Chest Port 1 View  Result Date: 11/30/2020 CLINICAL DATA:  Shortness of breath, recent pneumonia in a 35 year old female EXAM: PORTABLE CHEST 1 VIEW COMPARISON:  November 28, 2020 FINDINGS: Interstitial and airspace disease worse than on the prior study, superimposed on a background of pulmonary emphysema. Interval development of a small RIGHT-sided pleural effusion. Trachea midline. Cardiomediastinal contours and hilar structures are stable. Nodular component to the airspace process with focal areas of nodularity the for example in the RIGHT upper and LEFT lower lobe. No acute skeletal process on limited assessment. IMPRESSION: 1. Worsening interstitial and airspace disease superimposed on a background of pulmonary emphysema. Findings suspicious for multifocal pneumonia. Given nodular component would suggest follow-up to ensure resolution and exclude underlying pulmonary nodules. 2. New small RIGHT-sided pleural effusion. Could also consider the possibility of an overlay of heart failure. Electronically Signed   By: Zetta Bills M.D.   On: 11/30/2020 09:21   DG Chest Port 1 View  Result Date:  11/28/2020 CLINICAL DATA:  Nausea, vomiting, fever and cough. EXAM: PORTABLE CHEST 1 VIEW COMPARISON:  04/06/2020 FINDINGS: Artifact overlies the chest. Heart size is normal. There are patchy pulmonary infiltrates in both lungs, more  extensive on the right than the left, consistent with widespread bronchopneumonia. This could be bacterial or viral. No lobar consolidation or collapse. No pleural effusion. No significant bone finding. IMPRESSION: Widespread bilateral bronchopneumonia, right more extensive than left. This could be bacterial or viral. Electronically Signed   By: Nelson Chimes M.D.   On: 11/28/2020 00:26   US Abdomen Limited RUQ (LIVER/GB)  Result Date: 11/30/2020 CLINICAL DATA:  Right upper quadrant pain and elevated LFTs EXAM: ULTRASOUND ABDOMEN LIMITED RIGHT UPPER QUADRANT COMPARISON:  None. FINDINGS: Gallbladder: Gallbladder is well distended. Mild gallbladder sludge is noted. No gallstones are seen. Common bile duct: Diameter: 3.2 mm. Liver: Mild increased echogenicity consistent with fatty infiltration. No mass lesion is noted. Portal vein is patent on color Doppler imaging with normal direction of blood flow towards the liver. Other: None. IMPRESSION: Fatty infiltration of the liver. Gallbladder sludge.  No cholelithiasis or wall thickening is noted. Electronically Signed   By: Inez Catalina M.D.   On: 11/30/2020 12:50   US THORACENTESIS ASP PLEURAL SPACE W/IMG GUIDE  Result Date: 12/01/2020 INDICATION: COVID-19 pneumonia, sepsis, small RIGHT pleural effusion EXAM: ULTRASOUND GUIDED DIAGNOSTIC RIGHT THORACENTESIS MEDICATIONS: None. COMPLICATIONS: None immediate. PROCEDURE: An ultrasound guided thoracentesis was thoroughly discussed with the patient and questions answered. The benefits, risks, alternatives and complications were also discussed. The patient understands and wishes to proceed with the procedure. Written consent was obtained. Ultrasound was performed to localize and mark an  adequate pocket of fluid in the RIGHT chest. The area was then prepped and draped in the normal sterile fashion. 1% Lidocaine was used for local anesthesia. Under ultrasound guidance a a syringe with a 22 gauge needle was advanced into the small RIGHT pleural effusion. 16 cc of fluid was performed. The catheter was removed and a dressing applied. FINDINGS: A total of approximately 16 mL of slightly cloudy yellow fluid was removed. Sample was sent to the laboratory as requested by the clinical team. IMPRESSION: Successful ultrasound guided right thoracentesis yielding 16 mL of pleural fluid. Electronically Signed   By: Lavonia Dana M.D.   On: 12/01/2020 14:24     CBC Recent Labs  Lab 11/27/20 1958 11/30/20 0856 11/30/20 1340 12/01/20 0700  WBC 21.1* 5.3 6.3 8.7  HGB 14.4 12.2 11.6* 10.2*  HCT 43.0 39.9 34.7* 29.4*  PLT 273 273 195 167  MCV 87.8 96.4 88.7 85.5  MCH 29.4 29.5 29.7 29.7  MCHC 33.5 30.6 33.4 34.7  RDW 14.7 16.6* 15.5 15.3  LYMPHSABS  --  0.4*  --  0.9  MONOABS  --  0.1  --  0.2  EOSABS  --  0.0  --  0.0  BASOSABS  --  0.1  --  0.0    Chemistries  Recent Labs  Lab 11/27/20 1958 11/30/20 0856 11/30/20 1340 11/30/20 1632 11/30/20 2125 12/01/20 0700 12/01/20 0900  NA 129* 123* 129* 134* 136  --  137  K 3.5 3.1* 2.6* 2.9* 3.7  --  3.8  CL 93* 82* 97* 99 107  --  107  CO2 23 11* 18* 23 20*  --  24  GLUCOSE 554* 1,056* 623* 448* 268*  --  192*  BUN 33* 22* $Remov'19 17 16  'FbACkq$ --  21*  CREATININE 1.11* 1.15* 0.98 0.80 0.68  --  0.67  CALCIUM 7.3* 7.0* 6.8* 7.0* 6.6*  --  6.6*  MG  --  2.9*  --   --   --  2.3  --   AST 100*  246*  --   --   --   --  101*  ALT 130* 299*  --   --   --   --  173*  ALKPHOS 139* 748*  --   --   --   --  438*  BILITOT 0.8 1.7*  --   --   --   --  0.4   ------------------------------------------------------------------------------------------------------------------ Recent Labs    11/30/20 0856  TRIG 360*    Lab Results  Component Value Date    HGBA1C 14.7 (H) 11/30/2020   ------------------------------------------------------------------------------------------------------------------ No results for input(s): TSH, T4TOTAL, T3FREE, THYROIDAB in the last 72 hours.  Invalid input(s): FREET3 ------------------------------------------------------------------------------------------------------------------ Recent Labs    11/30/20 0856 12/01/20 0700  FERRITIN 528* 323*    Coagulation profile No results for input(s): INR, PROTIME in the last 168 hours.  Recent Labs    11/30/20 0856  DDIMER 3.05*    Cardiac Enzymes No results for input(s): CKMB, TROPONINI, MYOGLOBIN in the last 168 hours.  Invalid input(s): CK ------------------------------------------------------------------------------------------------------------------    Component Value Date/Time   BNP 352.8 (H) 03/08/2019 1847     Roxan Hockey M.D on 12/01/2020 at 2:59 PM  Go to [email or web address] - for contact info  [facility] - Office  [phone]

## 2020-12-01 NOTE — Progress Notes (Signed)
eLink Physician-Brief Progress Note Patient Name: [name] DOB: 08-08-1986 MRN: [id number]   Date of Service  12/01/2020  HPI/Events of Note  ABG on 100%/PRVC 24/TV 440/P 8 = 7.303/56.4/193.   eICU Interventions  Plan: 1. Increase PRVC rate to 29. 2. Repeat ABG at 6 AM.     Intervention Category Major Interventions: Acid-Base disturbance - evaluation and management;Respiratory failure - evaluation and management  Lenell Antu 12/01/2020, 4:58 AM

## 2020-12-02 ENCOUNTER — Inpatient Hospital Stay (HOSPITAL_COMMUNITY): Payer: Medicaid Other

## 2020-12-02 DIAGNOSIS — R6521 Severe sepsis with septic shock: Secondary | ICD-10-CM | POA: Diagnosis not present

## 2020-12-02 DIAGNOSIS — U071 COVID-19: Secondary | ICD-10-CM | POA: Diagnosis not present

## 2020-12-02 DIAGNOSIS — J189 Pneumonia, unspecified organism: Secondary | ICD-10-CM | POA: Diagnosis not present

## 2020-12-02 DIAGNOSIS — J9600 Acute respiratory failure, unspecified whether with hypoxia or hypercapnia: Secondary | ICD-10-CM

## 2020-12-02 DIAGNOSIS — A419 Sepsis, unspecified organism: Secondary | ICD-10-CM | POA: Diagnosis not present

## 2020-12-02 LAB — CBC WITH DIFFERENTIAL/PLATELET
Abs Immature Granulocytes: 0.06 10*3/uL (ref 0.00–0.07)
Basophils Absolute: 0.1 10*3/uL (ref 0.0–0.1)
Basophils Relative: 1 %
Eosinophils Absolute: 0 10*3/uL (ref 0.0–0.5)
Eosinophils Relative: 0 %
HCT: 28.5 % — ABNORMAL LOW (ref 36.0–46.0)
Hemoglobin: 9.9 g/dL — ABNORMAL LOW (ref 12.0–15.0)
Immature Granulocytes: 1 %
Lymphocytes Relative: 11 %
Lymphs Abs: 0.8 10*3/uL (ref 0.7–4.0)
MCH: 29.5 pg (ref 26.0–34.0)
MCHC: 34.7 g/dL (ref 30.0–36.0)
MCV: 84.8 fL (ref 80.0–100.0)
Monocytes Absolute: 0.3 10*3/uL (ref 0.1–1.0)
Monocytes Relative: 4 %
Neutro Abs: 6 10*3/uL (ref 1.7–7.7)
Neutrophils Relative %: 83 %
Platelets: 126 10*3/uL — ABNORMAL LOW (ref 150–400)
RBC: 3.36 MIL/uL — ABNORMAL LOW (ref 3.87–5.11)
RDW: 16 % — ABNORMAL HIGH (ref 11.5–15.5)
WBC: 7.2 10*3/uL (ref 4.0–10.5)
nRBC: 0.3 % — ABNORMAL HIGH (ref 0.0–0.2)

## 2020-12-02 LAB — GLUCOSE, CAPILLARY
Glucose-Capillary: 191 mg/dL — ABNORMAL HIGH (ref 70–99)
Glucose-Capillary: 225 mg/dL — ABNORMAL HIGH (ref 70–99)
Glucose-Capillary: 210 mg/dL — ABNORMAL HIGH (ref 70–99)
Glucose-Capillary: 145 mg/dL — ABNORMAL HIGH (ref 70–99)
Glucose-Capillary: 142 mg/dL — ABNORMAL HIGH (ref 70–99)

## 2020-12-02 LAB — C-REACTIVE PROTEIN: CRP: 5.4 mg/dL — ABNORMAL HIGH (ref <1.0)

## 2020-12-02 LAB — COMPREHENSIVE METABOLIC PANEL
ALT: 129 U/L — ABNORMAL HIGH (ref 0–44)
AST: 62 U/L — ABNORMAL HIGH (ref 15–41)
Albumin: 1.5 g/dL — ABNORMAL LOW (ref 3.5–5.0)
Alkaline Phosphatase: 459 U/L — ABNORMAL HIGH (ref 38–126)
Anion gap: 6 (ref 5–15)
BUN: 30 mg/dL — ABNORMAL HIGH (ref 6–20)
CO2: 26 mmol/L (ref 22–32)
Calcium: 6.9 mg/dL — ABNORMAL LOW (ref 8.9–10.3)
Chloride: 107 mmol/L (ref 98–111)
Creatinine, Ser: 0.76 mg/dL (ref 0.44–1.00)
GFR, Estimated: >60 mL/min (ref >60)
Glucose, Bld: 231 mg/dL — ABNORMAL HIGH (ref 70–99)
Potassium: 4.6 mmol/L (ref 3.5–5.1)
Sodium: 139 mmol/L (ref 135–145)
Total Bilirubin: 0.6 mg/dL (ref 0.3–1.2)
Total Protein: 4.8 g/dL — ABNORMAL LOW (ref 6.5–8.1)

## 2020-12-02 LAB — MAGNESIUM: Magnesium: 2.1 mg/dL (ref 1.7–2.4)

## 2020-12-02 LAB — PATHOLOGIST SMEAR REVIEW

## 2020-12-02 LAB — D-DIMER, QUANTITATIVE: D-Dimer, Quant: 1.67 ug/mL-FEU — ABNORMAL HIGH (ref 0.00–0.50)

## 2020-12-02 LAB — FERRITIN: Ferritin: 225 ng/mL (ref 11–307)

## 2020-12-02 LAB — STREP PNEUMONIAE URINARY ANTIGEN: Strep Pneumo Urinary Antigen: NEGATIVE

## 2020-12-02 MED ORDER — ENOXAPARIN SODIUM 40 MG/0.4ML ~~LOC~~ SOLN
40.0000 mg | SUBCUTANEOUS | Status: DC
  Administered 2020-12-02 – 2020-12-12 (×11): 40 mg via SUBCUTANEOUS
  Filled 2020-12-02 (×11): qty 0.4

## 2020-12-02 MED ORDER — FENTANYL CITRATE (PF) 100 MCG/2ML IJ SOLN
25.0000 ug | INTRAMUSCULAR | Status: DC | PRN
  Administered 2020-12-02 – 2020-12-04 (×9): 100 ug via INTRAVENOUS
  Filled 2020-12-02 (×9): qty 2

## 2020-12-02 NOTE — Progress Notes (Signed)
[facility] night shift.  The patient has been very restless and combative.  She was trying to reach for the endotracheal tube and was kicking her legs towards the staff.  Her oxygen saturation decreased to the 80s her heart rate increased to the 150s.  The patient was unable to be redirected, as needed lorazepam was given and she had to be placed on bilateral wrists/ankles soft restraints to avoid self extubation or any other interference with treatment (please see the last 2 nursing staff notes for further details).  She is now currently sedated and in NAD.  Vital signs had normalized, except that the patient is still febrile.  She received acetaminophen 650 mg earlier at 2051.  She has good air entry bilaterally.  S1-S2, RRR and her abdomen is not distended.  There is no lower extremity edema, clubbing or cyanosis.  Sanda Klein, MD.

## 2020-12-02 NOTE — Progress Notes (Signed)
Inpatient Diabetes Program Recommendations  AACE/ADA: New Consensus Statement on Inpatient Glycemic Control   Target Ranges:  Prepandial:   less than 140 mg/dL      Peak postprandial:   less than 180 mg/dL (1-2 hours)      Critically ill patients:  140 - 180 mg/dL   Results for [name], [name] (MRN [id number]) as of 12/02/2020 07:57  Ref. Range 12/01/2020 12:08 12/01/2020 13:20 12/01/2020 15:07 12/01/2020 16:52 12/01/2020 20:20 12/01/2020 23:31 12/02/2020 05:26 12/02/2020 07:25  Glucose-Capillary Latest Ref Range: 70 - 99 mg/dL 355 (H) 732 (H) 202 (H)  Novolog 14 units @15 :00  Lantus 10 units @15 :00 192 (H)  Novolog 3 units @16 :53 54 (L) 79 191 (H) 225 (H)  Novolog 5 units  Results for [name], [name] (MRN ) as of 12/02/2020 07:57  Ref. Range 04/06/2020 19:48 11/30/2020 13:40  Hemoglobin A1C Latest Ref Range: 4.8 - 5.6 % 14.1 (H) 14.7 (H)   Review of Glycemic Control  Diabetes history:DM1 (makes NO insulin; requires basal, correction, and meal coverage insulin) Outpatient Diabetes medications:Lantus 35 units daily, Novolog 1-15 units TID with meals (1 unit per 15 grams of carbs) Current orders for Inpatient glycemic control:Lantus 10 units QPM, Novolog 0-15 units TID with meals, Novolog 0-5 units QHS; Solumedrol 40 mg Q12H  Inpatient Diabetes Program Recommendations:    Insulin: If steroids will be continued, please consider increasing Lantus 8 units BID (to start this am), CBGs to Q4H, and Novolog correction to 0-9 units Q4H.  Thanks, 12/04/2020, RN, MSN, CDE Diabetes Coordinator Inpatient Diabetes Program [phone] (Team Pager from 8am to 5pm)

## 2020-12-02 NOTE — Progress Notes (Signed)
Entered patient room to find her agitated, sitting up in bed, attempting to reach for ETT, kicking her legs. O2 sat low 70s, HR 150s. Unable to console patient or redirect her. She would not make eye contact or make any attempts to follow commands. PRN meds given. RT called to bedside. Placed back on pressure support. Repositioned patient in bed. Resting calmly at this time.

## 2020-12-02 NOTE — Progress Notes (Signed)
Patient became very agitated while RRT was working with her. Patient combative, throwing herself all over bed, reaching for ETT, kicking staff. O2 sats in 80's, HR 150. Unable to calm or redirect patient. Ankle restraints now in place as well as wrists, PRN ativan provided.

## 2020-12-02 NOTE — Progress Notes (Signed)
Per Dr. Vassie Loll - postpone CT scan until tomorrow.

## 2020-12-02 NOTE — Progress Notes (Signed)
Patient placed on C/PS mode 5/5 35% and tolerating well at this time.  RT will continue to monitor.

## 2020-12-02 NOTE — Progress Notes (Signed)
Patient Demographics:    [name], is a 35 y.o. female, DOB - 30-Jun-1986, ZOX:[id number]  Admit date - 11/30/2020   Admitting Physician Pratik Darleen Crocker, DO  Outpatient Primary MD for the patient is [facility] [place]  LOS - 2   Chief Complaint  Patient presents with  . Weakness       . hypoxia        Subjective:    [name] today remains intubated and sedated --- Doing well with sedation vacation,  -Doing well with Vent weaning protocol --I called and updated patient's husband by phone   Assessment  & Plan :    Principal Problem:   Severe sepsis with septic shock -secondary to community-acquired pneumonia with parapneumonic effusion Active Problems:   DKA, type 1 ([place])   Pneumonia due to COVID-19 virus   Acute Hypoxic respiratory failure due to COVID-19 ([place])   Sepsis secondary to community-acquired pneumonia with parapneumonic effusion   CAP (community acquired pneumonia)--- with parapneumonic effusion   Benzodiazepine abuse ([place])   Cocaine abuse ([place])   Substance induced mood disorder ([place])   Substance abuse ([place])   Tobacco abuse   Brief Summary:- 35 y.o. female with medical history significant for type 1 diabetes, GERD, and cocaine use admitted on 12/01/2020 with pathophysiology consistent with DKA-as well as AKI and acute hypoxic respiratory failure secondary to Covid pneumonia with transaminitis and electrolyte abnormalities, and found to have severe sepsis with shock secondary to bacterial pneumonia superimposed on Covid pneumonia -- A/p 1)Acute hypoxic respiratory failure secondary to COVID-19 infection/Pneumonia--- The treatment plan and use of medications  for treatment of COVID-19 infection and possible side effects were discussed with pts Husband -----[name] verbalizes understanding and agrees to treatment protocols   --Patient is positive for  COVID-19 infection, chest x-ray with findings of infiltrates/opacities,  patient is tachypneic/hypoxic and requiring intubation and sedation  ---patient meets criteria for initiation of Remdesivir AND Steroid therapy per protocol  --Check and trend inflammatory markers including D-dimer, ferritin and  CRP---also follow CBC and CMP -Follow serial chest x-rays and ABGs as indicated --Attempt to maintain euvolemic state --Zinc and vitamin C as ordered -Albuterol inhaler as needed -Accu-Cheks/fingersticks while on high-dose steroids -PPI while on high-dose steroids -Currently not a candidate for Actemra due to concerns about acute superimposed bacterial infection -12/02/20 -Patient remains intubated and sedated-continue   Precedex -Discontinued Versed -Doing well with sedation vacation,  -Doing well with Vent weaning protocol  COVID-19 Labs  Recent Labs    11/30/20 0856 12/01/20 0700 12/02/20 0510  DDIMER 3.05*  --  1.67*  FERRITIN 528* 323* 225  LDH 500*  --   --   CRP 19.5* 9.1* 5.4*    Lab Results  Component Value Date   SARSCOV2NAA POSITIVE (A) 11/30/2020   [place] NEGATIVE 04/06/2020   [place] NEGATIVE 03/08/2019   [place] NOT DETECTED 02/28/2019   2)Severe sepsis with septic shock secondary to community-acquired pneumonia with parapneumonic effusion--- POA--patient met sepsis criteria on admission with fevers, tachycardia, tachypnea, as well as profound hypoxia and hypotension as well as lactic acidosis -Status post diagnostic right-sided ultrasound thoracentesis on 12/01/2020 -Continue IV cefepime and vancomycin pending blood cultures and pleural fluid studies (exudate) -Leukocytosis has resolved -Weaned off  IV Levophed  -PCCM consult from Dr. Kara Mead appreciated -Repeat CT chest without contrast within the next 24 hours to rule out loculated effusion  3)DKA-DKA pathophysiology appears to have resolved, -Off IV insulin drip, continue subcu insulin IV  fluids  4)-Prophylaxis-Protonix for GI prophylaxis Lovenox for DVT prophylaxis  5) elevated LFTs--- liver ultrasound with fatty liver otherwise no acute findings  Hepatic Function Latest Ref Rng & Units 12/02/2020 12/01/2020 11/30/2020  Total Protein 6.5 - 8.1 g/dL 4.8(L) 4.8(L) 6.2(L)  Albumin 3.5 - 5.0 g/dL 1.5(L) 1.6(L) 2.6(L)  AST 15 - 41 U/L 62(H) 101(H) 246(H)  ALT 0 - 44 U/L 129(H) 173(H) 299(H)  Alk Phosphatase 38 - 126 U/L 459(H) 438(H) 748(H)  Total Bilirubin 0.3 - 1.2 mg/dL 0.6 0.4 1.7(H)  Bilirubin, Direct 0.0 - 0.2 mg/dL - - -    6) hyponatremia/hypokalemia--resolved with replacement and hydration  7)COPD/tobacco abuse--- bronchodilators-ordered  -patient currently intubated and sedated  8) polysubstance abuse--- patient admitted to using cocaine 3 days ago UDS at this time is already negative -Will most likely need hydroxyzine, gabapentin and methocarbamol post extubation to limit withdrawal symptoms  9)Social/Ethics--patient has a husband and 2 kids, husband is primary contact and primary decision maker- -patient's husband will keep on it for members updated on patient's condition and other questions -Patient remains a full code  CRITICAL CARE Performed by: Roxan Hockey   Total critical care time: 42 minutes  Critical care time was exclusive of separately billable procedures and treating other patients.  Vent Settings:- PRVC/35%/5/30/440  Critical care was necessary to treat or prevent imminent or life-threatening deterioration.  Critical care was time spent personally by me on the following activities: development of treatment plan with patient and/or surrogate as well as nursing, discussions with consultants, evaluation of patient's response to treatment, examination of patient, obtaining history from patient or surrogate, ordering and performing treatments and interventions, ordering and review of laboratory studies, ordering and review of radiographic  studies, pulse oximetry and re-evaluation of patient's condition.  Disposition/Need for in-Hospital Stay- patient unable to be discharged at this time due to  -Severe sepsis with septic shock secondary to community-acquired pneumonia with parapneumonic effusion-and DKA as well as Covid pneumonia requiring IV antibiotics, IV fluids, IV pressors, intubation and sedation  Status is: Inpatient  Remains inpatient appropriate because:Hemodynamically unstable -Please see above  Disposition: The patient is from: Home              Anticipated d/c is to: Home              Anticipated d/c date is: > 3 days              Patient currently is not medically stable to d/c. Barriers: Not Clinically Stable-   Code Status :  -  Code Status: Full Code   Family Communication:   I called and updated patient's husband by phone  Consults  :  PCCM/IR  Procedures:- -Intubation on 11/30/2020 -Status post diagnostic right-sided ultrasound thoracentesis on 12/01/2020 -Right IJ central line placement on 12/01/2020  DVT Prophylaxis  :   - SCDs   enoxaparin (LOVENOX) injection 40 mg Start: 12/02/20 1800    Lab Results  Component Value Date   PLT 126 (L) 12/02/2020    Inpatient Medications  Scheduled Meds: . chlorhexidine gluconate (MEDLINE KIT)  15 mL Mouth Rinse BID  . Chlorhexidine Gluconate Cloth  6 each Topical Daily  . enoxaparin (LOVENOX) injection  40 mg Subcutaneous Q24H  . insulin  aspart  0-15 Units Subcutaneous TID WC  . insulin aspart  0-5 Units Subcutaneous QHS  . insulin glargine  10 Units Subcutaneous QPM  . mouth rinse  15 mL Mouth Rinse 10 times per day  . methylPREDNISolone (SOLU-MEDROL) injection  40 mg Intravenous Q12H  . mupirocin ointment  1 application Nasal BID   Continuous Infusions: . ceFEPime (MAXIPIME) IV Stopped (12/02/20 1346)  . dexmedetomidine (PRECEDEX) IV infusion 1 mcg/kg/hr (12/02/20 1504)  . famotidine (PEPCID) IV Stopped (12/02/20 0948)  . lactated ringers 125  mL/hr at 12/02/20 1504  . norepinephrine (LEVOPHED) Adult infusion Stopped (12/01/20 1817)  . remdesivir 100 mg in NS 100 mL Stopped (12/02/20 1019)  . vancomycin Stopped (12/02/20 0441)   PRN Meds:.acetaminophen (TYLENOL) oral liquid 160 mg/5 mL **OR** acetaminophen, dextrose, guaiFENesin-dextromethorphan, Ipratropium-Albuterol, LORazepam    Anti-infectives (From admission, onward)   Start     Dose/Rate Route Frequency Ordered Stop   12/02/20 0315  vancomycin (VANCOREADY) IVPB 500 mg/100 mL       "Followed by" Linked Group Details   500 mg 100 mL/hr over 60 Minutes Intravenous Every 12 hours 12/01/20 1423     12/01/20 2000  ceFEPIme (MAXIPIME) 2 g in sodium chloride 0.9 % 100 mL IVPB        2 g 200 mL/hr over 30 Minutes Intravenous Every 8 hours 12/01/20 1423     12/01/20 1500  vancomycin (VANCOCIN) IVPB 1000 mg/200 mL premix       "Followed by" Linked Group Details   1,000 mg 200 mL/hr over 60 Minutes Intravenous  Once 12/01/20 1423 12/01/20 1611   12/01/20 1000  remdesivir 100 mg in sodium chloride 0.9 % 100 mL IVPB       "Followed by" Linked Group Details   100 mg 200 mL/hr over 30 Minutes Intravenous Daily 11/30/20 1556 12/05/20 0959   11/30/20 1645  remdesivir 100 mg in sodium chloride 0.9 % 100 mL IVPB       "Followed by" Linked Group Details   100 mg 200 mL/hr over 30 Minutes Intravenous Every 30 min 11/30/20 1556 11/30/20 1756   11/30/20 1200  ceFEPIme (MAXIPIME) 2 g in sodium chloride 0.9 % 100 mL IVPB  Status:  Discontinued        2 g 200 mL/hr over 30 Minutes Intravenous Every 12 hours 11/30/20 1126 12/01/20 1423   11/30/20 0930  cefTRIAXone (ROCEPHIN) 1 g in sodium chloride 0.9 % 100 mL IVPB        1 g 200 mL/hr over 30 Minutes Intravenous  Once 11/30/20 0926 11/30/20 1016   11/30/20 0930  azithromycin (ZITHROMAX) 500 mg in sodium chloride 0.9 % 250 mL IVPB        500 mg 250 mL/hr over 60 Minutes Intravenous  Once 11/30/20 0926 11/30/20 1104        Objective:    Vitals:   12/02/20 1300 12/02/20 1330 12/02/20 1400 12/02/20 1500  BP: (!) 145/92 (!) 133/94 (!) 141/96 (!) 137/101  Pulse: 78 81 79 81  Resp: _0 Temp:      TempSrc:      SpO2: 97% 97% 99% 96%  Weight:      Height:        Wt Readings from Last 3 Encounters:  12/02/20 55.5 kg  11/27/20 49.9 kg  04/07/20 47.6 kg     Intake/Output Summary (Last 24 hours) at 12/02/2020 1506 Last data filed at 12/02/2020 1504 Gross per 24 hour  Intake 4097.88  ml  Output 825 ml  Net 3272.88 ml    Physical Exam  Gen:-Intubated and sedated HEENT:- [place].AT, No sclera icterus Neck-right IJ central line,.  Lungs-diminished breath sounds bilaterally with few scattered rhonchi CV- S1, S2 normal, regular  Abd-  +ve B.Sounds, Abd Soft,ND,    Extremity/Skin:- No  edema, pedal pulses present  NeuroPsych--Limited exam as patient is currently intubated and sedated GU-Foley in situ   Data Review:   Micro Results Recent Results (from the past 240 hour(s))  Blood Culture (routine x 2)     Status: None (Preliminary result)   Collection Time: 11/30/20  8:49 AM   Specimen: BLOOD LEFT ARM  Result Value Ref Range Status   Specimen Description BLOOD LEFT ARM DRAWN BY RN  Final   Special Requests   Final    BOTTLES DRAWN AEROBIC AND ANAEROBIC Blood Culture results may not be optimal due to an inadequate volume of blood received in culture bottles   Culture   Final    NO GROWTH 2 DAYS Performed at [facility], [address], [place], [place] [address]    Report Status PENDING  Incomplete  Resp Panel by RT-PCR (Flu A&B, Covid) Nasopharyngeal Swab     Status: Abnormal   Collection Time: 11/30/20  8:56 AM   Specimen: Nasopharyngeal Swab; Nasopharyngeal(NP) swabs in vial transport medium  Result Value Ref Range Status   SARS Coronavirus 2 by RT PCR POSITIVE (A) NEGATIVE Final    Comment: RESULT CALLED TO, READ BACK BY AND VERIFIED WITH: CRAWFORD H. AT 1049AM ON [id number] BY THOMPSON  S. (NOTE) SARS-CoV-2 target nucleic acids are DETECTED.  The SARS-CoV-2 RNA is generally detectable in upper respiratory specimens during the acute phase of infection. Positive results are indicative of the presence of the identified virus, but do not rule out bacterial infection or co-infection with other pathogens not detected by the test. Clinical correlation with patient history and other diagnostic information is necessary to determine patient infection status. The expected result is Negative.  Fact Sheet for Patients: EntrepreneurPulse.com.au  Fact Sheet for Healthcare Providers: IncredibleEmployment.be  This test is not yet approved or cleared by the [place] FDA and  has been authorized for detection and/or diagnosis of SARS-CoV-2 by FDA under an Emergency Use Authorization (EUA).  This EUA will remain in effect (meaning thi s test can be used) for the duration of  the COVID-19 declaration under Section 564(b)(1) of the Act, 21 U.S.C. section 360bbb-3(b)(1), unless the authorization is terminated or revoked sooner.     Influenza A by PCR NEGATIVE NEGATIVE Final   Influenza B by PCR NEGATIVE NEGATIVE Final    Comment: (NOTE) The Xpert Xpress SARS-CoV-2/FLU/RSV plus assay is intended as an aid in the diagnosis of influenza from Nasopharyngeal swab specimens and should not be used as a sole basis for treatment. Nasal washings and aspirates are unacceptable for Xpert Xpress SARS-CoV-2/FLU/RSV testing.  Fact Sheet for Patients: EntrepreneurPulse.com.au  Fact Sheet for Healthcare Providers: IncredibleEmployment.be  This test is not yet approved or cleared by the [place] FDA and has been authorized for detection and/or diagnosis of SARS-CoV-2 by FDA under an Emergency Use Authorization (EUA). This EUA will remain in effect (meaning this test can be used) for the duration of the COVID-19  declaration under Section 564(b)(1) of the Act, 21 U.S.C. section 360bbb-3(b)(1), unless the authorization is terminated or revoked.  Performed at [facility], [address], [place], [place] [address]   Blood Culture (routine x 2)  Status: None (Preliminary result)   Collection Time: 11/30/20  9:21 AM   Specimen: BLOOD LEFT FOREARM  Result Value Ref Range Status   Specimen Description BLOOD LEFT FOREARM  Final   Special Requests   Final    BOTTLES DRAWN AEROBIC AND ANAEROBIC Blood Culture adequate volume   Culture   Final    NO GROWTH 2 DAYS Performed at [facility], [address] [place], [place] [address]    Report Status PENDING  Incomplete  MRSA PCR Screening     Status: Abnormal   Collection Time: 11/30/20 12:30 PM   Specimen: Nasopharyngeal  Result Value Ref Range Status   MRSA by PCR POSITIVE (A) NEGATIVE Final    Comment:        The GeneXpert MRSA Assay (FDA approved for NASAL specimens only), is one component of a comprehensive MRSA colonization surveillance program. It is not intended to diagnose MRSA infection nor to guide or monitor treatment for MRSA infections. RESULT CALLED TO, READ BACK BY AND VERIFIED WITH: ASHLEY SHELTON 2/14 @ 0938 BY Bolivar Haw Performed at [facility], [address], [place], [place] [address]   Gram stain     Status: None   Collection Time: 12/01/20  2:02 PM   Specimen: Pleura  Result Value Ref Range Status   Specimen Description PLEURAL  Final   Special Requests NONE  Final   Gram Stain   Final    CYTOSPIN SMEAR NO ORGANISMS SEEN WBC PRESENT, PREDOMINANTLY PMN Performed at [facility], [address] [place], [place] [address]    Report Status 12/01/2020 FINAL  Final  Body fluid culture w Gram Stain     Status: None (Preliminary result)   Collection Time: 12/01/20  2:04 PM   Specimen: Pleura; Body Fluid  Result Value Ref Range Status   Specimen Description   Final    PLEURAL Performed at [facility], [address], [place], [place] [address]    Special Requests   Final    Normal Performed at [place] [place], [place] [address]    Gram Stain   Final    MODERATE WBC PRESENT, PREDOMINANTLY PMN NO ORGANISMS SEEN Performed at [facility], [place] [address], [place], [place] [address]    Culture PENDING  Incomplete   Report Status PENDING  Incomplete   Radiology Reports CT ABDOMEN PELVIS W CONTRAST  Result Date: 11/28/2020 CLINICAL DATA:  Abdominal pain, biliary obstruction suspected. EXAM: CT ABDOMEN AND PELVIS WITH CONTRAST TECHNIQUE: Multidetector CT imaging of the abdomen and pelvis was performed using the standard protocol following bolus administration of intravenous contrast. CONTRAST:  150m OMNIPAQUE IOHEXOL 300 MG/ML  SOLN COMPARISON:  CT abdomen pelvis 04/10/2017, CT angio chest 05/18/2017 FINDINGS: Lower chest: Partially visualized trace right pleural effusion. Associated passive atelectasis of the right lower lobe. Dependent right lower lobe consolidation. Masslike right upper and right lower lobe consolidations (4:3, 5:33). Hepatobiliary: The liver measures at upper limits of normal in size. No focal liver abnormality. No gallstones, gallbladder wall thickening, or pericholecystic fluid. No biliary dilatation. Pancreas: No focal lesion. Normal pancreatic contour. No surrounding inflammatory changes. No main pancreatic ductal dilatation. Spleen: Normal in size without focal abnormality. Adrenals/Urinary Tract: No adrenal nodule bilaterally. Bilateral kidneys enhance symmetrically. No hydronephrosis. No hydroureter. The urinary bladder is unremarkable. Stomach/Bowel: Fluid level within the gastric lumen likely related to ingested material. Stomach is within normal limits. Prominent but not dilated loop of small bowel within the lower  anterior abdomen with no definite transition point. No pneumatosis. No evidence of bowel wall thickening or dilatation. Appendix appears normal.  Vascular/Lymphatic: No abdominal aorta or iliac aneurysm. 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 No abdominal, pelvic, or inguinal lymphadenopathy. 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 Uterus and bilateral adnexa are unremarkable. Other: Trace simple free fluid within the pelvis that could be physiologic in etiology. No intraperitoneal free gas. No organized fluid collection. Musculoskeletal: No acute or significant osseous findings. IMPRESSION: 1. Dependent right lower lobe consolidation. Masslike right upper and right lower lobe consolidation. Findings likely represents infection/inflammation. Recommend repeat chest x-ray PA and lateral in 3-4 weeks to exclude underlying malignancy. 2. Trace volume right pleural effusion. 3. 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 Electronically Signed   By: Iven Finn M.D.   On: 11/28/2020 01:35   DG CHEST PORT 1 VIEW  Result Date: 12/02/2020 CLINICAL DATA:  Intubated, COVID EXAM: PORTABLE CHEST 1 VIEW COMPARISON:  12/01/2020 FINDINGS: Endotracheal tube is 3 cm above the carina. NG tube is in the stomach. Layering bilateral pleural effusions. Interstitial prominence throughout the lungs. Bilateral lower lobe airspace disease. No real change since prior study. IMPRESSION: Persistent interstitial prominence and pulmonary infiltrates. Layering bilateral effusions. No change. Electronically Signed   By: Rolm Baptise M.D.   On: 12/02/2020 06:33   DG Chest Portable 1 View  Result Date: 12/01/2020 CLINICAL DATA:  RIGHT pleural effusion, COVID-19, sepsis EXAM: PORTABLE CHEST 1 VIEW COMPARISON:  Portable exam at 1345 hrs compared to 0921 hrs FINDINGS: Tip of endotracheal tube projects 1.9 cm above carina. NG tube extends into stomach. Stable heart size and mediastinal contours normal. Persistent diffuse pulmonary infiltrates unchanged. Persistent small RIGHT pleural effusion.  No pneumothorax following RIGHT thoracentesis. IMPRESSION: No pneumothorax following diagnostic RIGHT thoracentesis. Persistent pulmonary infiltrates and small RIGHT pleural effusion. Electronically Signed   By: Lavonia Dana M.D.   On: 12/01/2020 14:20   DG CHEST PORT 1 VIEW  Result Date: 12/01/2020 CLINICAL DATA:  35 year old ventilated female status post central line placement. EXAM: PORTABLE CHEST 1 VIEW COMPARISON:  Chest x-ray 11/30/2020. FINDINGS: An endotracheal tube is in place with tip 3.3 cm above the carina. There is a right-sided internal jugular central venous catheter with tip terminating in the distal superior vena cava. Widespread areas of ill-defined opacities and areas of interstitial prominence are noted throughout the lungs bilaterally, most evident throughout the mid to lower lungs, minimally improved compared to the prior examination. Moderate right and small left pleural effusions. No pneumothorax. Pulmonary vasculature is largely obscured. Heart size is normal. The patient is rotated to the left on today's exam, resulting in distortion of the mediastinal contours and reduced diagnostic sensitivity and specificity for mediastinal pathology. IMPRESSION: 1. Support apparatus, as above. 2. The appearance of the lungs is most compatible with severe multilobar bilateral pneumonia related to known COVID infection. Overall, aeration has slightly improved compared to the prior study. 3. Moderate right and small left pleural effusions similar to the prior examination. Electronically Signed   By: Vinnie Langton M.D.   On: 12/01/2020 09:37   DG CHEST PORT 1 VIEW  Result Date: 11/30/2020 CLINICAL DATA:  COVID-19 positive, tobacco abuse, intubated EXAM: PORTABLE CHEST 1 VIEW COMPARISON:  11/30/2020, 11/28/2020 FINDINGS: Single frontal view of the chest demonstrates interval placement of endotracheal tube overlying tracheal air column, tip 1.7 cm above carina. Enteric catheter passes below  diaphragm tip excluded by  collimation, side port projecting over the gastric body. Multifocal bilateral ground-glass airspace disease has progressed since prior study, which could reflect progressive pneumonia or superimposed edema. Stable right pleural effusion. No pneumothorax. IMPRESSION: 1. Support devices as above. 2. Progressive diffuse ground-glass airspace disease, which may reflect worsening pneumonia or superimposed edema. 3. Stable right pleural effusion. Electronically Signed   By: Randa Ngo M.D.   On: 11/30/2020 20:07   DG Chest Port 1 View  Result Date: 11/30/2020 CLINICAL DATA:  Shortness of breath, recent pneumonia in a 35 year old female EXAM: PORTABLE CHEST 1 VIEW COMPARISON:  November 28, 2020 FINDINGS: Interstitial and airspace disease worse than on the prior study, superimposed on a background of pulmonary emphysema. Interval development of a small RIGHT-sided pleural effusion. Trachea midline. Cardiomediastinal contours and hilar structures are stable. Nodular component to the airspace process with focal areas of nodularity the for example in the RIGHT upper and LEFT lower lobe. No acute skeletal process on limited assessment. IMPRESSION: 1. Worsening interstitial and airspace disease superimposed on a background of pulmonary emphysema. Findings suspicious for multifocal pneumonia. Given nodular component would suggest follow-up to ensure resolution and exclude underlying pulmonary nodules. 2. New small RIGHT-sided pleural effusion. Could also consider the possibility of an overlay of heart failure. Electronically Signed   By: Zetta Bills M.D.   On: 11/30/2020 09:21   DG Chest Port 1 View  Result Date: 11/28/2020 CLINICAL DATA:  Nausea, vomiting, fever and cough. EXAM: PORTABLE CHEST 1 VIEW COMPARISON:  04/06/2020 FINDINGS: Artifact overlies the chest. Heart size is normal. There are patchy pulmonary infiltrates in both lungs, more extensive on the right than the left,  consistent with widespread bronchopneumonia. This could be bacterial or viral. No lobar consolidation or collapse. No pleural effusion. No significant bone finding. IMPRESSION: Widespread bilateral bronchopneumonia, right more extensive than left. This could be bacterial or viral. Electronically Signed   By: Nelson Chimes M.D.   On: 11/28/2020 00:26   US Abdomen Limited RUQ (LIVER/GB)  Result Date: 11/30/2020 CLINICAL DATA:  Right upper quadrant pain and elevated LFTs EXAM: ULTRASOUND ABDOMEN LIMITED RIGHT UPPER QUADRANT COMPARISON:  None. FINDINGS: Gallbladder: Gallbladder is well distended. Mild gallbladder sludge is noted. No gallstones are seen. Common bile duct: Diameter: 3.2 mm. Liver: Mild increased echogenicity consistent with fatty infiltration. No mass lesion is noted. Portal vein is patent on color Doppler imaging with normal direction of blood flow towards the liver. Other: None. IMPRESSION: Fatty infiltration of the liver. Gallbladder sludge.  No cholelithiasis or wall thickening is noted. Electronically Signed   By: Inez Catalina M.D.   On: 11/30/2020 12:50   US THORACENTESIS ASP PLEURAL SPACE W/IMG GUIDE  Result Date: 12/01/2020 INDICATION: COVID-19 pneumonia, sepsis, small RIGHT pleural effusion EXAM: ULTRASOUND GUIDED DIAGNOSTIC RIGHT THORACENTESIS MEDICATIONS: None. COMPLICATIONS: None immediate. PROCEDURE: An ultrasound guided thoracentesis was thoroughly discussed with the patient and questions answered. The benefits, risks, alternatives and complications were also discussed. The patient understands and wishes to proceed with the procedure. Written consent was obtained. Ultrasound was performed to localize and mark an adequate pocket of fluid in the RIGHT chest. The area was then prepped and draped in the normal sterile fashion. 1% Lidocaine was used for local anesthesia. Under ultrasound guidance a a syringe with a 22 gauge needle was advanced into the small RIGHT pleural effusion. 16 cc  of fluid was performed. The catheter was removed and a dressing applied. FINDINGS: A total of approximately 16 mL of slightly cloudy yellow fluid  was removed. Sample was sent to the laboratory as requested by the clinical team. IMPRESSION: Successful ultrasound guided right thoracentesis yielding 16 mL of pleural fluid. Electronically Signed   By: Lavonia Dana M.D.   On: 12/01/2020 14:24     CBC Recent Labs  Lab 11/27/20 1958 11/30/20 0856 11/30/20 1340 12/01/20 0700 12/02/20 0510  WBC 21.1* 5.3 6.3 8.7 7.2  HGB 14.4 12.2 11.6* 10.2* 9.9*  HCT 43.0 39.9 34.7* 29.4* 28.5*  PLT 273 273 195 167 126*  MCV 87.8 96.4 88.7 85.5 84.8  MCH 29.4 29.5 29.7 29.7 29.5  MCHC 33.5 30.6 33.4 34.7 34.7  RDW 14.7 16.6* 15.5 15.3 16.0*  LYMPHSABS  --  0.4*  --  0.9 0.8  MONOABS  --  0.1  --  0.2 0.3  EOSABS  --  0.0  --  0.0 0.0  BASOSABS  --  0.1  --  0.0 0.1    Chemistries  Recent Labs  Lab 11/27/20 1958 11/30/20 0856 11/30/20 1340 11/30/20 1632 11/30/20 2125 12/01/20 0700 12/01/20 0900 12/02/20 0510  NA 129* 123* 129* 134* 136  --  137 139  K 3.5 3.1* 2.6* 2.9* 3.7  --  3.8 4.6  CL 93* 82* 97* 99 107  --  107 107  CO2 23 11* 18* 23 20*  --  24 26  GLUCOSE 554* 1,056* 623* 448* 268*  --  192* 231*  BUN 33* 22* _0 --  21* 30*  CREATININE 1.11* 1.15* 0.98 0.80 0.68  --  0.67 0.76  CALCIUM 7.3* 7.0* 6.8* 7.0* 6.6*  --  6.6* 6.9*  MG  --  2.9*  --   --   --  2.3  --  2.1  AST 100* 246*  --   --   --   --  101* 62*  ALT 130* 299*  --   --   --   --  173* 129*  ALKPHOS 139* 748*  --   --   --   --  438* 459*  BILITOT 0.8 1.7*  --   --   --   --  0.4 0.6   ------------------------------------------------------------------------------------------------------------------ Recent Labs    11/30/20 0856  TRIG 360*    Lab Results  Component Value Date   HGBA1C 14.7 (H) 11/30/2020    ------------------------------------------------------------------------------------------------------------------ No results for input(s): TSH, T4TOTAL, T3FREE, THYROIDAB in the last 72 hours.  Invalid input(s): FREET3 ------------------------------------------------------------------------------------------------------------------ Recent Labs    12/01/20 0700 12/02/20 0510  FERRITIN 323* 225    Coagulation profile No results for input(s): INR, PROTIME in the last 168 hours.  Recent Labs    11/30/20 0856 12/02/20 0510  DDIMER 3.05* 1.67*    Cardiac Enzymes No results for input(s): CKMB, TROPONINI, MYOGLOBIN in the last 168 hours.  Invalid input(s): CK ------------------------------------------------------------------------------------------------------------------    Component Value Date/Time   BNP 352.8 (H) 03/08/2019 1847     Roxan Hockey M.D on 12/02/2020 at 3:06 PM  Go to [email or web address] - for contact info  [facility] - Office  [phone]

## 2020-12-02 NOTE — Progress Notes (Addendum)
NAME:  [name], MRN:  [id number], DOB:  02/19/1986, LOS: 2 ADMISSION DATE:  11/30/2020, CONSULTATION DATE:  12/02/2020  REFERRING MD:  Gwendolyn Lima, CHIEF COMPLAINT: Respiratory failure requiring mechanical ventilation  Brief History:  35 year old with history of IDDM and cocaine use, admitted with pneumonia and due to Covid infection and DKA, worsening hypoxia on 2/14 requiring mechanical ventilation  History of Present Illness:  History is obtained from chart review since patient is currently intubated and sedated. She has a history of active cocaine use and prior episodes of DKA.  She is reported to be vaccinated for Covid but not boosted. ED visit on 2/12 for nausea and vomiting, chest x-ray showed bilateral bronchopneumonia CT abdomen showed masslike consolidation right upper and lower lobe with trace right effusion, but no intra-abdominal process.  She was discharged with cefdinir and azithromycin. She returned on 2/14 via EMS with hypoxia, saturation 66% on room air and CBGs more than 600.  She required nonrebreather in the ED, Covid PCR was positive, initial labs showed lactate of 6.8 and DKA with CBG greater than 1000.  LFTs were elevated.  Prolonged QT was noted on EKG. she admitted to cocaine use 3 days prior to admission, surprisingly urine toxicology was negative She was aggressively resuscitated with fluids and IV insulin, started on remdesivir and Solu-Medrol and admitted to the ICU right upper quadrant ultrasound did not show any active process She transitioned from nonrebreather to heated high flow oxygen to BiPAP and was eventually intubated on 2/14 for hypoxic/hypercarbic respiratory failure    Past Medical History:  IDDM GERD Cocaine use  Significant Hospital Events:  2/14 intubated  Consults:    Procedures:  ETT 2/14 >> RIJ 2/15 >> [place] thora 2/15 >> 16cc "cloudy" fluid removed  Significant Diagnostic Tests:  CT abdomen 2/12 masslike consolidation right  upper and lower lobe with trace right effusion, stenosis of celiac artery similar to that noted in 2018  RUQ [place] 2/14 >> no CBD dilatation, fatty liver  Micro Data:  [facility] 2/14 >>ng Pleural fluid 2/15 >>ng MRSA PCR POS resp 2/15 >> Urine strep antigen negative  Antimicrobials:  Cefepime 2/14 >> vanc 2/15 >>   Interim History / Subjective:   Remains critically ill, intubated Blood pressure improved Afebrile Good urine output Sedation turned off this morning but remains poorly responsive  Objective   Blood pressure (!) 145/92, pulse 78, temperature 99.4 F (37.4 C), temperature source Axillary, resp. rate 15, height 5' (1.524 m), weight 55.5 kg, last menstrual period 11/25/2020, SpO2 97 %.    Vent Mode: CPAP;PSV FiO2 (%):  [35 %-40 %] 35 % Set Rate:  [20 bmp-30 bmp] 20 bmp Vt Set:  [440 mL] 440 mL PEEP:  [5 cmH20] 5 cmH20 Pressure Support:  [5 cmH20] 5 cmH20 Plateau Pressure:  [20 cmH20-22 cmH20] 22 cmH20   Intake/Output Summary (Last 24 hours) at 12/02/2020 1327 Last data filed at 12/02/2020 1310 Gross per 24 hour  Intake 3959.7 ml  Output 825 ml  Net 3134.7 ml   Filed Weights   11/30/20 0846 12/02/20 0346  Weight: 49 kg 55.5 kg    Examination: General: Young woman, intubated and sedated, no distress HENT: Mild pallor, no icterus, no JVD Lungs: Bilateral ventilated breath sounds, no accessory muscle use Cardiovascular: S1-S2 regular, no murmur Abdomen: Soft, nontender, no organomegaly Extremities: No deformity, no track marks on skin Neuro: Sedated, RASS -2 GU: Clear urine  Chest x-ray independently reviewed from 2/16, right effusion has decreased, bilateral small  effusions  Resolved Hospital Problem list     Assessment & Plan:  Her presentation seems to be more compatible with multilobar bronchopneumonia with a right parapneumonic effusion, that seems to have increased from initial presentation on 2/12.  Differential is aspiration related to vomiting which may  have been induced by cocaine use and known celiac artery stenosis   Acute hypoxic/hypercarbic respiratory failure Mild ARDS range hypoxia -improved -P/F ratio is much improved, plateau and driving pressure acceptable -Start spontaneous breathing trials with goal extubation  Community-acquired pneumonia -treating as HAP with cefepime +vancomycin Await respiratory culture Right parapneumonic effusion -pleural fluid results consistent with neutrophilic exudate -Obtain CT chest without contrast to confirm no loculations, can wait until extubation   COVID-19 positive test (U07.1, COVID-19) with Acute Pneumonia (J12.89, Other viral pneumonia) -Remdesivir and steroids per [facility] -Not a candidate for Actemra due to ongoing infection   DKA -beta hydroxybutyrate and anion gap resolved, transitioned to subcu insulin -Expect that Solu-Medrol will still drive up her sugars  Elevated LFTs -unclear cause , ultrasound showed fatty liver -Trend  Acute metabolic encephalopathy -due to hypoxia and hypercarbia, admitted to cocaine use 3 days PTA although UDS was negative -Using Precedex, goal RASS zero, expect effect of Versed to last for few hours  Best practice (evaluated daily)  Diet: Tube feeds Pain/Anxiety/Delirium protocol (if indicated): Precedex VAP protocol (if indicated): Y DVT prophylaxis: Lovenox GI prophylaxis: Protonix Glucose control: Insulin SQ Mobility: Bedrest Disposition: ICU  Goals of Care:   Code Status: Full code  Labs   CBC: Recent Labs  Lab 11/27/20 1958 11/30/20 0856 11/30/20 1340 12/01/20 0700 12/02/20 0510  WBC 21.1* 5.3 6.3 8.7 7.2  NEUTROABS  --  4.6  --  7.3 6.0  HGB 14.4 12.2 11.6* 10.2* 9.9*  HCT 43.0 39.9 34.7* 29.4* 28.5*  MCV 87.8 96.4 88.7 85.5 84.8  PLT 273 273 195 167 126*    Basic Metabolic Panel: Recent Labs  Lab 11/30/20 0856 11/30/20 1340 11/30/20 1632 11/30/20 2125 12/01/20 0700 12/01/20 0900 12/02/20 0510  NA 123*  129* 134* 136  --  137 139  K 3.1* 2.6* 2.9* 3.7  --  3.8 4.6  CL 82* 97* 99 107  --  107 107  CO2 11* 18* 23 20*  --  24 26  GLUCOSE 1,056* 623* 448* 268*  --  192* 231*  BUN 22* 19 17 16   --  21* 30*  CREATININE 1.15* 0.98 0.80 0.68  --  0.67 0.76  CALCIUM 7.0* 6.8* 7.0* 6.6*  --  6.6* 6.9*  MG 2.9*  --   --   --  2.3  --  2.1   GFR: Estimated Creatinine Clearance: 76 mL/min (by C-G formula based on SCr of 0.76 mg/dL). Recent Labs  Lab 11/30/20 0856 11/30/20 1129 11/30/20 1340 11/30/20 1809 11/30/20 2125 12/01/20 0700 12/02/20 0510  PROCALCITON 32.32  --   --   --   --  15.87  --   WBC 5.3  --  6.3  --   --  8.7 7.2  LATICACIDVEN 6.8* 3.5*  --  2.1* 2.7*  --   --     Liver Function Tests: Recent Labs  Lab 11/27/20 1958 11/30/20 0856 12/01/20 0900 12/02/20 0510  AST 100* 246* 101* 62*  ALT 130* 299* 173* 129*  ALKPHOS 139* 748* 438* 459*  BILITOT 0.8 1.7* 0.4 0.6  PROT 7.2 6.2* 4.8* 4.8*  ALBUMIN 3.1* 2.6* 1.6* 1.5*   Recent Labs  Lab 11/27/20 1958  LIPASE 18   No results for input(s): AMMONIA in the last 168 hours.     Critical care time: 19 m     Cyril Mourning MD. Tonny Bollman. [facility] Pager : [phone]  If no response to pager , please call [phone] until 7 pm After 7:00 pm call Elink  [phone]   12/02/2020

## 2020-12-03 ENCOUNTER — Inpatient Hospital Stay (HOSPITAL_COMMUNITY): Payer: Medicaid Other

## 2020-12-03 DIAGNOSIS — J984 Other disorders of lung: Secondary | ICD-10-CM

## 2020-12-03 DIAGNOSIS — I340 Nonrheumatic mitral (valve) insufficiency: Secondary | ICD-10-CM | POA: Diagnosis not present

## 2020-12-03 DIAGNOSIS — I361 Nonrheumatic tricuspid (valve) insufficiency: Secondary | ICD-10-CM

## 2020-12-03 DIAGNOSIS — A419 Sepsis, unspecified organism: Secondary | ICD-10-CM | POA: Diagnosis not present

## 2020-12-03 DIAGNOSIS — J869 Pyothorax without fistula: Secondary | ICD-10-CM

## 2020-12-03 DIAGNOSIS — R6521 Severe sepsis with septic shock: Secondary | ICD-10-CM | POA: Diagnosis not present

## 2020-12-03 DIAGNOSIS — I5021 Acute systolic (congestive) heart failure: Secondary | ICD-10-CM | POA: Diagnosis present

## 2020-12-03 DIAGNOSIS — U071 COVID-19: Secondary | ICD-10-CM | POA: Diagnosis not present

## 2020-12-03 DIAGNOSIS — I38 Endocarditis, valve unspecified: Secondary | ICD-10-CM

## 2020-12-03 DIAGNOSIS — J189 Pneumonia, unspecified organism: Secondary | ICD-10-CM | POA: Diagnosis not present

## 2020-12-03 LAB — ECHOCARDIOGRAM COMPLETE
Area-P 1/2: 4.21 cm2
Height: 60 in
S' Lateral: 3.15 cm
Weight: 1957.68 oz

## 2020-12-03 LAB — CBC WITH DIFFERENTIAL/PLATELET
Basophils Absolute: 0 10*3/uL (ref 0.0–0.1)
Basophils Relative: 0 %
Eosinophils Absolute: 0 10*3/uL (ref 0.0–0.5)
Eosinophils Relative: 0 %
HCT: 30.9 % — ABNORMAL LOW (ref 36.0–46.0)
Hemoglobin: 10.5 g/dL — ABNORMAL LOW (ref 12.0–15.0)
Lymphocytes Relative: 24 %
Lymphs Abs: 1.4 10*3/uL (ref 0.7–4.0)
MCH: 29.3 pg (ref 26.0–34.0)
MCHC: 34 g/dL (ref 30.0–36.0)
MCV: 86.3 fL (ref 80.0–100.0)
Monocytes Absolute: 0.3 10*3/uL (ref 0.1–1.0)
Monocytes Relative: 5 %
Neutro Abs: 4.1 10*3/uL (ref 1.7–7.7)
Neutrophils Relative %: 71 %
Platelets: 113 10*3/uL — ABNORMAL LOW (ref 150–400)
RBC: 3.58 MIL/uL — ABNORMAL LOW (ref 3.87–5.11)
RDW: 16.8 % — ABNORMAL HIGH (ref 11.5–15.5)
WBC: 5.8 10*3/uL (ref 4.0–10.5)
nRBC: 0.5 % — ABNORMAL HIGH (ref 0.0–0.2)

## 2020-12-03 LAB — COMPREHENSIVE METABOLIC PANEL
ALT: 181 U/L — ABNORMAL HIGH (ref 0–44)
AST: 303 U/L — ABNORMAL HIGH (ref 15–41)
Albumin: 1.6 g/dL — ABNORMAL LOW (ref 3.5–5.0)
Alkaline Phosphatase: 544 U/L — ABNORMAL HIGH (ref 38–126)
Anion gap: 5 (ref 5–15)
BUN: 36 mg/dL — ABNORMAL HIGH (ref 6–20)
CO2: 27 mmol/L (ref 22–32)
Calcium: 7.1 mg/dL — ABNORMAL LOW (ref 8.9–10.3)
Chloride: 109 mmol/L (ref 98–111)
Creatinine, Ser: 0.67 mg/dL (ref 0.44–1.00)
GFR, Estimated: >60 mL/min (ref >60)
Glucose, Bld: 259 mg/dL — ABNORMAL HIGH (ref 70–99)
Potassium: 5.2 mmol/L — ABNORMAL HIGH (ref 3.5–5.1)
Sodium: 141 mmol/L (ref 135–145)
Total Bilirubin: 0.6 mg/dL (ref 0.3–1.2)
Total Protein: 4.9 g/dL — ABNORMAL LOW (ref 6.5–8.1)

## 2020-12-03 LAB — C-REACTIVE PROTEIN: CRP: 3.4 mg/dL — ABNORMAL HIGH (ref <1.0)

## 2020-12-03 LAB — GLUCOSE, CAPILLARY
Glucose-Capillary: 150 mg/dL — ABNORMAL HIGH (ref 70–99)
Glucose-Capillary: 178 mg/dL — ABNORMAL HIGH (ref 70–99)
Glucose-Capillary: 182 mg/dL — ABNORMAL HIGH (ref 70–99)
Glucose-Capillary: 203 mg/dL — ABNORMAL HIGH (ref 70–99)
Glucose-Capillary: 219 mg/dL — ABNORMAL HIGH (ref 70–99)
Glucose-Capillary: 97 mg/dL (ref 70–99)

## 2020-12-03 LAB — D-DIMER, QUANTITATIVE: D-Dimer, Quant: 1.98 ug/mL-FEU — ABNORMAL HIGH (ref 0.00–0.50)

## 2020-12-03 LAB — FERRITIN: Ferritin: 466 ng/mL — ABNORMAL HIGH (ref 11–307)

## 2020-12-03 LAB — MAGNESIUM: Magnesium: 2.2 mg/dL (ref 1.7–2.4)

## 2020-12-03 MED ORDER — GABAPENTIN 600 MG PO TABS
600.0000 mg | ORAL_TABLET | Freq: Three times a day (TID) | ORAL | Status: DC
  Filled 2020-12-03 (×11): qty 1

## 2020-12-03 MED ORDER — PROPOFOL 1000 MG/100ML IV EMUL: 5.0000 ug/kg/min | INTRAVENOUS | Status: DC

## 2020-12-03 MED ORDER — PROPOFOL 1000 MG/100ML IV EMUL
INTRAVENOUS | Status: AC
  Administered 2020-12-03: 5 ug/kg/min via INTRAVENOUS
  Filled 2020-12-03: qty 100

## 2020-12-03 MED ORDER — DEXMEDETOMIDINE HCL IN NACL 400 MCG/100ML IV SOLN
0.4000 ug/kg/h | INTRAVENOUS | Status: DC
  Administered 2020-12-03 – 2020-12-04 (×2): 1.4 ug/kg/h via INTRAVENOUS
  Administered 2020-12-04: 1.404 ug/kg/h via INTRAVENOUS
  Administered 2020-12-05: 1.4 ug/kg/h via INTRAVENOUS
  Administered 2020-12-05: 1 ug/kg/h via INTRAVENOUS
  Filled 2020-12-03 (×6): qty 100

## 2020-12-03 MED ORDER — METHYLPREDNISOLONE SODIUM SUCC 40 MG IJ SOLR
40.0000 mg | Freq: Every day | INTRAMUSCULAR | Status: DC
  Administered 2020-12-04 – 2020-12-12 (×9): 40 mg via INTRAVENOUS
  Filled 2020-12-03 (×9): qty 1

## 2020-12-03 MED ORDER — METHOCARBAMOL 500 MG PO TABS
750.0000 mg | ORAL_TABLET | Freq: Four times a day (QID) | ORAL | Status: DC
Start: 2020-12-03 — End: 2020-12-08
  Administered 2020-12-05 – 2020-12-08 (×11): 750 mg via ORAL
  Filled 2020-12-03 (×11): qty 2

## 2020-12-03 MED ORDER — LORAZEPAM 2 MG/ML IJ SOLN
1.0000 mg | INTRAMUSCULAR | Status: DC | PRN
  Administered 2020-12-03: 1 mg via INTRAVENOUS
  Filled 2020-12-03: qty 1

## 2020-12-03 NOTE — Progress Notes (Signed)
Inpatient Diabetes Program Recommendations  AACE/ADA: New Consensus Statement on Inpatient Glycemic Control   Target Ranges:  Prepandial:   less than 140 mg/dL      Peak postprandial:   less than 180 mg/dL (1-2 hours)      Critically ill patients:  140 - 180 mg/dL  Results for [name], [name] (MRN [id number]) as of 12/03/2020 07:56  Ref. Range 12/03/2020 07:37  Glucose-Capillary Latest Ref Range: 70 - 99 mg/dL 500 (H)   Results for [name], [name] (MRN [id number]) as of 12/03/2020 06:23  Ref. Range 12/02/2020 07:25 12/02/2020 11:01 12/02/2020 16:36 12/02/2020 19:40 12/03/2020 00:24 12/03/2020 04:51  Glucose-Capillary Latest Ref Range: 70 - 99 mg/dL 716 (H) 967 (H) 893 (H) 142 (H) 178 (H) 203 (H)   Review of Glycemic Control  Diabetes history:DM1 (makes NO insulin; requires basal, correction, and meal coverage insulin) Outpatient Diabetes medications:Lantus 35 units daily, Novolog 1-15 units TID with meals (1 unit per 15 grams of carbs) Current orders for Inpatient glycemic control:Lantus 10 units QPM, Novolog 0-15 units TID with meals, Novolog 0-5 units QHS; Solumedrol 40 mg Q12H  Inpatient Diabetes Program Recommendations:  Insulin:  If steroids will be continued as ordered and patient remains NPO,please consider increasing Lantus13 units QPM, CBGs to Q4H, and Novolog correction to 0-9 units Q4H.  Thanks, Orlando Penner, RN, MSN, CDE Diabetes Coordinator Inpatient Diabetes Program [phone] (Team Pager from 8am to 5pm)

## 2020-12-03 NOTE — Progress Notes (Signed)
RT was doing vent check tonight and had suctioned patient.  Patient became extremely agitated and started thrashing around in the bed and kicking her legs and pulling at her arms. RT had to try and hold patient to keep her from harming herself.  Patient was also chewing at her tube and had seemed to do this before due to caking on her lips.  RN came in and assisted this RT to help calm patient.  Patient was a given a PRN medication to help her calm down.  Patient's HR went extremely high in 140s during this episode.  Will continue to monitor.

## 2020-12-03 NOTE — Progress Notes (Signed)
Patient was extubated earlier today.  Patient is no on Bipap and seems to be tolerating well.  Will continue to monitor.

## 2020-12-03 NOTE — Progress Notes (Signed)
Went in to do patient's vent check for the morning.  Patient's BS were Rhonchi and felt patient needed suctioning.  Asked RN to come in and give another PRN sedation to help patient's agitation during suction.  Suctioned patient and got out secretions that were white, thick, with black specs in it.  Patient's BS were better after suction and patient did not get agitated as before.

## 2020-12-03 NOTE — Progress Notes (Signed)
Notified MD patient agitated, asynchronous with vent, desatting to low 80s, thrashing in the bed, unable to console or redirect. PRN fentanyl not effective. Order received for propofol gtt. Pt continues to desat, notified RT to assess ETT.

## 2020-12-03 NOTE — Progress Notes (Signed)
Patient Demographics:    [name], is a 35 y.o. female, DOB - [birth year], GYY:[id number]  Admit date - 11/30/2020   Admitting Physician Pratik Darleen Crocker, DO  Outpatient Primary MD for the patient is [facility] [place]   Chief Complaint  Patient presents with  . Weakness       . hypoxia        Subjective:    [name] today remains intubated and sedated --- Overnight patient had episodes of agitation requiring as needed lorazepam and as needed fentanyl and wrist restraints  -Patient apparently continued to trash around and was asynchronous with vent, desatting to low 80s,  - -Discussed with PCCM attending Dr. Elsworth Soho propofol initiated -Okay to stop IV Precedex   Assessment  & Plan :    Principal Problem:   Severe sepsis with septic shock -secondary to community-acquired pneumonia with parapneumonic effusion Active Problems:   DKA, type 1 ([place])   Pneumonia due to COVID-19 virus   Acute Hypoxic respiratory failure due to COVID-19 ([place])   Sepsis secondary to community-acquired pneumonia with parapneumonic effusion   CAP (community acquired pneumonia)--- with parapneumonic effusion   Benzodiazepine abuse ([place])   Cocaine abuse ([place])   Substance induced mood disorder ([place])   Substance abuse ([place])   Tobacco abuse   Brief Summary:- 35 y.o. female with medical history significant for type 1 diabetes, GERD, and cocaine use admitted on 12/01/2020 with pathophysiology consistent with DKA-as well as AKI and acute hypoxic respiratory failure secondary to Covid pneumonia with transaminitis and electrolyte abnormalities, and found to have severe sepsis with shock secondary to bacterial pneumonia superimposed on Covid pneumonia, pleural fluid culture from 12/01/20 showing staph  -- A/p 1)Acute hypoxic respiratory failure secondary to COVID-19 infection/Pneumonia--- The treatment  plan and use of medications  for treatment of COVID-19 infection and possible side effects were discussed with pts Husband -----[name] verbalizes understanding and agrees to treatment protocols   --Patient is positive for COVID-19 infection, chest x-ray with findings of infiltrates/opacities,  patient is tachypneic/hypoxic and requiring intubation and sedation  ---patient meets criteria for initiation of Remdesivir AND Steroid therapy per protocol  --Check and trend inflammatory markers including D-dimer, ferritin and  CRP---also follow CBC and CMP -Follow serial chest x-rays and ABGs as indicated --Attempt to maintain euvolemic state --Zinc and vitamin C as ordered -Albuterol inhaler as needed -Accu-Cheks/fingersticks while on high-dose steroids -PPI while on high-dose steroids -Currently not a candidate for Actemra due to concerns about acute superimposed bacterial infection -12/03/20 -Patient remains intubated and sedated-switched to IV propofol from   Precedex for better sedation - COVID-19 Labs  Recent Labs    12/01/20 0700 12/02/20 0510 12/03/20 0459  DDIMER  --  1.67* 1.98*  FERRITIN 323* 225 466*  CRP 9.1* 5.4* 3.4*    Lab Results  Component Value Date   SARSCOV2NAA POSITIVE (A) 11/30/2020   [place] NEGATIVE 04/06/2020   [place] NEGATIVE 03/08/2019   [place] NOT DETECTED 02/28/2019   2)Severe staphylococcal sepsis with septic shock secondary to community-acquired pneumonia with parapneumonic effusion--- POA--patient met sepsis criteria on admission with fevers, tachycardia, tachypnea, as well as profound hypoxia and hypotension as well as lactic acidosis -Status post diagnostic right-sided ultrasound thoracentesis  on 12/01/2020 -Continue IV cefepime and vancomycin pending blood cultures and pleural fluid studies (exudate)-- - pleural fluid culture from 12/01/20 showing staph species awaiting final ID and sensitivity -Leukocytosis has resolved -Weaned  off  IV Levophed  -PCCM consult from Dr. Cyril Mourning appreciated -Repeat CT chest without contrast to be done on 12/03/2020 to rule out loculated effusion  3)DKA-DKA pathophysiology appears to have resolved, -Off IV insulin drip, continue subcu insulin IV fluids  4)-Prophylaxis-Protonix for GI prophylaxis Lovenox for DVT prophylaxis  5) elevated LFTs--- liver ultrasound with fatty liver otherwise no acute findings -12/03/20--LFTs are trending back up again may have been due to shock liver in the setting of hypotension  Hepatic Function Latest Ref Rng & Units 12/03/2020 12/02/2020 12/01/2020  Total Protein 6.5 - 8.1 g/dL 4.9(L) 4.8(L) 4.8(L)  Albumin 3.5 - 5.0 g/dL 9.9(K) 9.4(O) 0.0(D)  AST 15 - 41 U/L 303(H) 62(H) 101(H)  ALT 0 - 44 U/L 181(H) 129(H) 173(H)  Alk Phosphatase 38 - 126 U/L 544(H) 459(H) 438(H)  Total Bilirubin 0.3 - 1.2 mg/dL 0.6 0.6 0.4  Bilirubin, Direct 0.0 - 0.2 mg/dL - - -    6) hyponatremia/hyperkalemia--hyponatremia has resolved , potassium is up to 5.2  7)COPD/tobacco abuse--- bronchodilators-ordered  -patient currently intubated and sedated  8) polysubstance abuse--- patient admitted to using cocaine 3 days ago UDS at this time is already negative -Will most likely need hydroxyzine, gabapentin and methocarbamol post extubation to limit withdrawal symptoms  9)Social/Ethics--patient has a husband and 2 kids, husband is primary contact and primary decision maker- -patient's husband will keep on it for members updated on patient's condition and other questions -Patient remains a full code  10)Acute anemia and Acute thrombocytopenia--- in the setting of sepsis and hemodilution--- continue to monitor closely, no evidence of ongoing bleeding at this time  CRITICAL CARE Performed by: Shon Hale   Total critical care time: 47 minutes  Critical care time was exclusive of separately billable procedures and treating other patients.  Vent  Settings:- PRVC/40%/5/30/440 -- on propofol drip Critical care was necessary to treat or prevent imminent or life-threatening deterioration.  Critical care was time spent personally by me on the following activities: development of treatment plan with patient and/or surrogate as well as nursing, discussions with consultants, evaluation of patient's response to treatment, examination of patient, obtaining history from patient or surrogate, ordering and performing treatments and interventions, ordering and review of laboratory studies, ordering and review of radiographic studies, pulse oximetry and re-evaluation of patient's condition.  Disposition/Need for in-Hospital Stay- patient unable to be discharged at this time due to  -Severe staphylococcal sepsis with septic shock secondary to community-acquired pneumonia with parapneumonic effusion-and DKA as well as Covid pneumonia requiring IV antibiotics, IV fluids,  intubation and sedation  Status is: Inpatient  Remains inpatient appropriate because:Hemodynamically unstable -Please see above  Disposition: The patient is from: Home              Anticipated d/c is to: Home              Anticipated d/c date is: > 3 days              Patient currently is not medically stable to d/c. Barriers: Not Clinically Stable-   Code Status :  -  Code Status: Full Code   Family Communication:   I called and updated patient's husband by phone  Consults  :  PCCM/IR  Procedures:- -Intubation on 11/30/2020 -Status post diagnostic right-sided ultrasound thoracentesis  on 12/01/2020 -Right IJ central line placement on 12/01/2020  DVT Prophylaxis  :   - SCDs   enoxaparin (LOVENOX) injection 40 mg Start: 12/02/20 1800    Lab Results  Component Value Date   PLT 113 (L) 12/03/2020    Inpatient Medications  Scheduled Meds: . chlorhexidine gluconate (MEDLINE KIT)  15 mL Mouth Rinse BID  . Chlorhexidine Gluconate Cloth  6 each Topical Daily  . enoxaparin  (LOVENOX) injection  40 mg Subcutaneous Q24H  . insulin aspart  0-15 Units Subcutaneous TID WC  . insulin aspart  0-5 Units Subcutaneous QHS  . insulin glargine  10 Units Subcutaneous QPM  . mouth rinse  15 mL Mouth Rinse 10 times per day  . methylPREDNISolone (SOLU-MEDROL) injection  40 mg Intravenous Q12H  . mupirocin ointment  1 application Nasal BID   Continuous Infusions: . ceFEPime (MAXIPIME) IV Stopped (12/03/20 9920)  . dexmedetomidine (PRECEDEX) IV infusion 0.6 mcg/kg/hr (12/03/20 1002)  . famotidine (PEPCID) IV Stopped (12/03/20 0932)  . lactated ringers 50 mL/hr at 12/03/20 1002  . norepinephrine (LEVOPHED) Adult infusion Stopped (12/01/20 1817)  . propofol (DIPRIVAN) infusion 15 mcg/kg/min (12/03/20 1002)  . remdesivir 100 mg in NS 100 mL 200 mL/hr at 12/03/20 1002  . vancomycin Stopped (12/03/20 0606)   PRN Meds:.acetaminophen (TYLENOL) oral liquid 160 mg/5 mL **OR** acetaminophen, dextrose, fentaNYL (SUBLIMAZE) injection, guaiFENesin-dextromethorphan, Ipratropium-Albuterol, LORazepam    Anti-infectives (From admission, onward)   Start     Dose/Rate Route Frequency Ordered Stop   12/02/20 0315  vancomycin (VANCOREADY) IVPB 500 mg/100 mL       "Followed by" Linked Group Details   500 mg 100 mL/hr over 60 Minutes Intravenous Every 12 hours 12/01/20 1423     12/01/20 2000  ceFEPIme (MAXIPIME) 2 g in sodium chloride 0.9 % 100 mL IVPB        2 g 200 mL/hr over 30 Minutes Intravenous Every 8 hours 12/01/20 1423     12/01/20 1500  vancomycin (VANCOCIN) IVPB 1000 mg/200 mL premix       "Followed by" Linked Group Details   1,000 mg 200 mL/hr over 60 Minutes Intravenous  Once 12/01/20 1423 12/01/20 1611   12/01/20 1000  remdesivir 100 mg in sodium chloride 0.9 % 100 mL IVPB       "Followed by" Linked Group Details   100 mg 200 mL/hr over 30 Minutes Intravenous Daily 11/30/20 1556 12/05/20 0959   11/30/20 1645  remdesivir 100 mg in sodium chloride 0.9 % 100 mL IVPB        "Followed by" Linked Group Details   100 mg 200 mL/hr over 30 Minutes Intravenous Every 30 min 11/30/20 1556 11/30/20 1756   11/30/20 1200  ceFEPIme (MAXIPIME) 2 g in sodium chloride 0.9 % 100 mL IVPB  Status:  Discontinued        2 g 200 mL/hr over 30 Minutes Intravenous Every 12 hours 11/30/20 1126 12/01/20 1423   11/30/20 0930  cefTRIAXone (ROCEPHIN) 1 g in sodium chloride 0.9 % 100 mL IVPB        1 g 200 mL/hr over 30 Minutes Intravenous  Once 11/30/20 0926 11/30/20 1016   11/30/20 0930  azithromycin (ZITHROMAX) 500 mg in sodium chloride 0.9 % 250 mL IVPB        500 mg 250 mL/hr over 60 Minutes Intravenous  Once 11/30/20 0926 11/30/20 1104        Objective:   Vitals:   12/03/20 0747 12/03/20 0800 12/03/20 0900 12/03/20  1000  BP:  103/73 111/87 111/84  Pulse:      Resp: _0 Temp: 99.9 F (37.7 C)     TempSrc: Axillary     SpO2:  98% 98% 98%  Weight:      Height:        Wt Readings from Last 3 Encounters:  12/02/20 55.5 kg  11/27/20 49.9 kg  04/07/20 47.6 kg     Intake/Output Summary (Last 24 hours) at 12/03/2020 1058 Last data filed at 12/03/2020 1002 Gross per 24 hour  Intake 2653.15 ml  Output 825 ml  Net 1828.15 ml    Physical Exam  Gen:-Intubated and sedated HEENT:- [place].AT, No sclera icterus Neck-right IJ central line,.  Lungs-diminished breath sounds bilaterally with few scattered rhonchi CV- S1, S2 normal, regular  Abd-  +ve B.Sounds, Abd Soft,ND,    Extremity/Skin:- No  edema, pedal pulses present  NeuroPsych--Limited exam as patient is currently intubated and sedated GU-Foley in situ   Data Review:   Micro Results Recent Results (from the past 240 hour(s))  Blood Culture (routine x 2)     Status: None (Preliminary result)   Collection Time: 11/30/20  8:49 AM   Specimen: BLOOD LEFT ARM  Result Value Ref Range Status   Specimen Description BLOOD LEFT ARM DRAWN BY RN  Final   Special Requests   Final    BOTTLES DRAWN AEROBIC AND  ANAEROBIC Blood Culture results may not be optimal due to an inadequate volume of blood received in culture bottles   Culture   Final    NO GROWTH 2 DAYS Performed at [facility], [address], [place], [place] [address]    Report Status PENDING  Incomplete  Resp Panel by RT-PCR (Flu A&B, Covid) Nasopharyngeal Swab     Status: Abnormal   Collection Time: 11/30/20  8:56 AM   Specimen: Nasopharyngeal Swab; Nasopharyngeal(NP) swabs in vial transport medium  Result Value Ref Range Status   SARS Coronavirus 2 by RT PCR POSITIVE (A) NEGATIVE Final    Comment: RESULT CALLED TO, READ BACK BY AND VERIFIED WITH: CRAWFORD H. AT 1049AM ON [id number] BY THOMPSON S. (NOTE) SARS-CoV-2 target nucleic acids are DETECTED.  The SARS-CoV-2 RNA is generally detectable in upper respiratory specimens during the acute phase of infection. Positive results are indicative of the presence of the identified virus, but do not rule out bacterial infection or co-infection with other pathogens not detected by the test. Clinical correlation with patient history and other diagnostic information is necessary to determine patient infection status. The expected result is Negative.  Fact Sheet for Patients: EntrepreneurPulse.com.au  Fact Sheet for Healthcare Providers: IncredibleEmployment.be  This test is not yet approved or cleared by the [place] FDA and  has been authorized for detection and/or diagnosis of SARS-CoV-2 by FDA under an Emergency Use Authorization (EUA).  This EUA will remain in effect (meaning thi s test can be used) for the duration of  the COVID-19 declaration under Section 564(b)(1) of the Act, 21 U.S.C. section 360bbb-3(b)(1), unless the authorization is terminated or revoked sooner.     Influenza A by PCR NEGATIVE NEGATIVE Final   Influenza B by PCR NEGATIVE NEGATIVE Final    Comment: (NOTE) The Xpert Xpress SARS-CoV-2/FLU/RSV plus assay is intended as  an aid in the diagnosis of influenza from Nasopharyngeal swab specimens and should not be used as a sole basis for treatment. Nasal washings and aspirates are unacceptable for Xpert Xpress SARS-CoV-2/FLU/RSV testing.  Fact  Sheet for Patients: EntrepreneurPulse.com.au  Fact Sheet for Healthcare Providers: IncredibleEmployment.be  This test is not yet approved or cleared by the [place] FDA and has been authorized for detection and/or diagnosis of SARS-CoV-2 by FDA under an Emergency Use Authorization (EUA). This EUA will remain in effect (meaning this test can be used) for the duration of the COVID-19 declaration under Section 564(b)(1) of the Act, 21 U.S.C. section 360bbb-3(b)(1), unless the authorization is terminated or revoked.  Performed at [facility], [address] [place], [place] [address]   Blood Culture (routine x 2)     Status: None (Preliminary result)   Collection Time: 11/30/20  9:21 AM   Specimen: BLOOD LEFT FOREARM  Result Value Ref Range Status   Specimen Description BLOOD LEFT FOREARM  Final   Special Requests   Final    BOTTLES DRAWN AEROBIC AND ANAEROBIC Blood Culture adequate volume   Culture   Final    NO GROWTH 2 DAYS Performed at [facility], [address] [place], [place] [address]    Report Status PENDING  Incomplete  MRSA PCR Screening     Status: Abnormal   Collection Time: 11/30/20 12:30 PM   Specimen: Nasopharyngeal  Result Value Ref Range Status   MRSA by PCR POSITIVE (A) NEGATIVE Final    Comment:        The GeneXpert MRSA Assay (FDA approved for NASAL specimens only), is one component of a comprehensive MRSA colonization surveillance program. It is not intended to diagnose MRSA infection nor to guide or monitor treatment for MRSA infections. RESULT CALLED TO, READ BACK BY AND VERIFIED WITH: ASHLEY SHELTON 2/14 @ 8295 BY Bolivar Haw Performed at [facility], [address] [place], [place]  [address]   Gram stain     Status: None   Collection Time: 12/01/20  2:02 PM   Specimen: Pleura  Result Value Ref Range Status   Specimen Description PLEURAL  Final   Special Requests NONE  Final   Gram Stain   Final    CYTOSPIN SMEAR NO ORGANISMS SEEN WBC PRESENT, PREDOMINANTLY PMN Performed at [facility], [address] [place], [place] [address]    Report Status 12/01/2020 FINAL  Final  Body fluid culture w Gram Stain     Status: None (Preliminary result)   Collection Time: 12/01/20  2:04 PM   Specimen: Pleura; Body Fluid  Result Value Ref Range Status   Specimen Description   Final    PLEURAL Performed at [facility], [address] [place], [place] [address]    Special Requests   Final    Normal Performed at [facility], [address], [place], [place] [address]    Gram Stain   Final    MODERATE WBC PRESENT, PREDOMINANTLY PMN NO ORGANISMS SEEN    Culture   Final    RARE STAPHYLOCOCCUS AUREUS SUSCEPTIBILITIES TO FOLLOW CRITICAL RESULT CALLED TO, READ BACK BY AND VERIFIED WITH: RN A.LARIMORE AT 1000 ON 12/03/2020 BY T.SAAD Performed at [facility], [place] [address] [place], [place] [address]    Report Status PENDING  Incomplete   Radiology Reports CT ABDOMEN PELVIS W CONTRAST  Result Date: 11/28/2020 CLINICAL DATA:  Abdominal pain, biliary obstruction suspected. EXAM: CT ABDOMEN AND PELVIS WITH CONTRAST TECHNIQUE: Multidetector CT imaging of the abdomen and pelvis was performed using the standard protocol following bolus administration of intravenous contrast. CONTRAST:  184mL OMNIPAQUE IOHEXOL 300 MG/ML  SOLN COMPARISON:  CT abdomen pelvis 04/10/2017, CT angio  chest 05/18/2017 FINDINGS: Lower chest: Partially visualized trace right pleural effusion. Associated passive atelectasis of the right lower lobe. Dependent right lower lobe consolidation. Masslike right upper and right lower lobe consolidations (4:3, 5:33). Hepatobiliary: The liver measures at upper limits  of normal in size. No focal liver abnormality. No gallstones, gallbladder wall thickening, or pericholecystic fluid. No biliary dilatation. Pancreas: No focal lesion. Normal pancreatic contour. No surrounding inflammatory changes. No main pancreatic ductal dilatation. Spleen: Normal in size without focal abnormality. Adrenals/Urinary Tract: No adrenal nodule bilaterally. Bilateral kidneys enhance symmetrically. No hydronephrosis. No hydroureter. The urinary bladder is unremarkable. Stomach/Bowel: Fluid level within the gastric lumen likely related to ingested material. Stomach is within normal limits. Prominent but not dilated loop of small bowel within the lower anterior abdomen with no definite transition point. No pneumatosis. No evidence of bowel wall thickening or dilatation. Appendix appears normal. Vascular/Lymphatic: No abdominal aorta or iliac aneurysm. 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 No abdominal, pelvic, or inguinal lymphadenopathy. 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 Uterus and bilateral adnexa are unremarkable. Other: Trace simple free fluid within the pelvis that could be physiologic in etiology. No intraperitoneal free gas. No organized fluid collection. Musculoskeletal: No acute or significant osseous findings. IMPRESSION: 1. Dependent right lower lobe consolidation. Masslike right upper and right lower lobe consolidation. Findings likely represents infection/inflammation. Recommend repeat chest x-ray PA and lateral in 3-4 weeks to exclude underlying malignancy. 2. Trace volume right pleural effusion. 3. 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 Electronically Signed   By: Iven Finn M.D.   On: 11/28/2020 01:35   DG CHEST PORT 1 VIEW  Result Date: 12/02/2020 CLINICAL DATA:  Intubated, COVID EXAM: PORTABLE CHEST 1 VIEW COMPARISON:  12/01/2020 FINDINGS: Endotracheal tube is 3 cm above the  carina. NG tube is in the stomach. Layering bilateral pleural effusions. Interstitial prominence throughout the lungs. Bilateral lower lobe airspace disease. No real change since prior study. IMPRESSION: Persistent interstitial prominence and pulmonary infiltrates. Layering bilateral effusions. No change. Electronically Signed   By: Rolm Baptise M.D.   On: 12/02/2020 06:33   DG Chest Portable 1 View  Result Date: 12/01/2020 CLINICAL DATA:  RIGHT pleural effusion, COVID-19, sepsis EXAM: PORTABLE CHEST 1 VIEW COMPARISON:  Portable exam at 1345 hrs compared to 0921 hrs FINDINGS: Tip of endotracheal tube projects 1.9 cm above carina. NG tube extends into stomach. Stable heart size and mediastinal contours normal. Persistent diffuse pulmonary infiltrates unchanged. Persistent small RIGHT pleural effusion. No pneumothorax following RIGHT thoracentesis. IMPRESSION: No pneumothorax following diagnostic RIGHT thoracentesis. Persistent pulmonary infiltrates and small RIGHT pleural effusion. Electronically Signed   By: Lavonia Dana M.D.   On: 12/01/2020 14:20   DG CHEST PORT 1 VIEW  Result Date: 12/01/2020 CLINICAL DATA:  35 year old ventilated female status post central line placement. EXAM: PORTABLE CHEST 1 VIEW COMPARISON:  Chest x-ray 11/30/2020. FINDINGS: An endotracheal tube is in place with tip 3.3 cm above the carina. There is a right-sided internal jugular central venous catheter with tip terminating in the distal superior vena cava. Widespread areas of ill-defined opacities and areas of interstitial prominence are noted throughout the lungs bilaterally, most evident throughout the mid to lower lungs, minimally improved compared to the prior examination. Moderate right and small left pleural effusions. No pneumothorax. Pulmonary vasculature is largely obscured. Heart size is normal. The patient is rotated to the left on today's exam, resulting in distortion of the mediastinal contours  and reduced diagnostic  sensitivity and specificity for mediastinal pathology. IMPRESSION: 1. Support apparatus, as above. 2. The appearance of the lungs is most compatible with severe multilobar bilateral pneumonia related to known COVID infection. Overall, aeration has slightly improved compared to the prior study. 3. Moderate right and small left pleural effusions similar to the prior examination. Electronically Signed   By: Vinnie Langton M.D.   On: 12/01/2020 09:37   DG CHEST PORT 1 VIEW  Result Date: 11/30/2020 CLINICAL DATA:  COVID-19 positive, tobacco abuse, intubated EXAM: PORTABLE CHEST 1 VIEW COMPARISON:  11/30/2020, 11/28/2020 FINDINGS: Single frontal view of the chest demonstrates interval placement of endotracheal tube overlying tracheal air column, tip 1.7 cm above carina. Enteric catheter passes below diaphragm tip excluded by collimation, side port projecting over the gastric body. Multifocal bilateral ground-glass airspace disease has progressed since prior study, which could reflect progressive pneumonia or superimposed edema. Stable right pleural effusion. No pneumothorax. IMPRESSION: 1. Support devices as above. 2. Progressive diffuse ground-glass airspace disease, which may reflect worsening pneumonia or superimposed edema. 3. Stable right pleural effusion. Electronically Signed   By: Randa Ngo M.D.   On: 11/30/2020 20:07   DG Chest Port 1 View  Result Date: 11/30/2020 CLINICAL DATA:  Shortness of breath, recent pneumonia in a 35 year old female EXAM: PORTABLE CHEST 1 VIEW COMPARISON:  November 28, 2020 FINDINGS: Interstitial and airspace disease worse than on the prior study, superimposed on a background of pulmonary emphysema. Interval development of a small RIGHT-sided pleural effusion. Trachea midline. Cardiomediastinal contours and hilar structures are stable. Nodular component to the airspace process with focal areas of nodularity the for example in the RIGHT upper and LEFT lower lobe. No acute  skeletal process on limited assessment. IMPRESSION: 1. Worsening interstitial and airspace disease superimposed on a background of pulmonary emphysema. Findings suspicious for multifocal pneumonia. Given nodular component would suggest follow-up to ensure resolution and exclude underlying pulmonary nodules. 2. New small RIGHT-sided pleural effusion. Could also consider the possibility of an overlay of heart failure. Electronically Signed   By: Zetta Bills M.D.   On: 11/30/2020 09:21   DG Chest Port 1 View  Result Date: 11/28/2020 CLINICAL DATA:  Nausea, vomiting, fever and cough. EXAM: PORTABLE CHEST 1 VIEW COMPARISON:  04/06/2020 FINDINGS: Artifact overlies the chest. Heart size is normal. There are patchy pulmonary infiltrates in both lungs, more extensive on the right than the left, consistent with widespread bronchopneumonia. This could be bacterial or viral. No lobar consolidation or collapse. No pleural effusion. No significant bone finding. IMPRESSION: Widespread bilateral bronchopneumonia, right more extensive than left. This could be bacterial or viral. Electronically Signed   By: Nelson Chimes M.D.   On: 11/28/2020 00:26   US Abdomen Limited RUQ (LIVER/GB)  Result Date: 11/30/2020 CLINICAL DATA:  Right upper quadrant pain and elevated LFTs EXAM: ULTRASOUND ABDOMEN LIMITED RIGHT UPPER QUADRANT COMPARISON:  None. FINDINGS: Gallbladder: Gallbladder is well distended. Mild gallbladder sludge is noted. No gallstones are seen. Common bile duct: Diameter: 3.2 mm. Liver: Mild increased echogenicity consistent with fatty infiltration. No mass lesion is noted. Portal vein is patent on color Doppler imaging with normal direction of blood flow towards the liver. Other: None. IMPRESSION: Fatty infiltration of the liver. Gallbladder sludge.  No cholelithiasis or wall thickening is noted. Electronically Signed   By: Inez Catalina M.D.   On: 11/30/2020 12:50   US THORACENTESIS ASP PLEURAL SPACE W/IMG  GUIDE  Result Date: 12/01/2020 INDICATION: COVID-19 pneumonia, sepsis, small RIGHT  pleural effusion EXAM: ULTRASOUND GUIDED DIAGNOSTIC RIGHT THORACENTESIS MEDICATIONS: None. COMPLICATIONS: None immediate. PROCEDURE: An ultrasound guided thoracentesis was thoroughly discussed with the patient and questions answered. The benefits, risks, alternatives and complications were also discussed. The patient understands and wishes to proceed with the procedure. Written consent was obtained. Ultrasound was performed to localize and mark an adequate pocket of fluid in the RIGHT chest. The area was then prepped and draped in the normal sterile fashion. 1% Lidocaine was used for local anesthesia. Under ultrasound guidance a a syringe with a 22 gauge needle was advanced into the small RIGHT pleural effusion. 16 cc of fluid was performed. The catheter was removed and a dressing applied. FINDINGS: A total of approximately 16 mL of slightly cloudy yellow fluid was removed. Sample was sent to the laboratory as requested by the clinical team. IMPRESSION: Successful ultrasound guided right thoracentesis yielding 16 mL of pleural fluid. Electronically Signed   By: Lavonia Dana M.D.   On: 12/01/2020 14:24     CBC Recent Labs  Lab 11/30/20 0856 11/30/20 1340 12/01/20 0700 12/02/20 0510 12/03/20 0459  WBC 5.3 6.3 8.7 7.2 5.8  HGB 12.2 11.6* 10.2* 9.9* 10.5*  HCT 39.9 34.7* 29.4* 28.5* 30.9*  PLT 273 195 167 126* 113*  MCV 96.4 88.7 85.5 84.8 86.3  MCH 29.5 29.7 29.7 29.5 29.3  MCHC 30.6 33.4 34.7 34.7 34.0  RDW 16.6* 15.5 15.3 16.0* 16.8*  LYMPHSABS 0.4*  --  0.9 0.8 1.4  MONOABS 0.1  --  0.2 0.3 0.3  EOSABS 0.0  --  0.0 0.0 0.0  BASOSABS 0.1  --  0.0 0.1 0.0    Chemistries  Recent Labs  Lab 11/27/20 1958 11/30/20 0856 11/30/20 1340 11/30/20 1632 11/30/20 2125 12/01/20 0700 12/01/20 0900 12/02/20 0510 12/03/20 0459  NA 129* 123*   < > 134* 136  --  137 139 141  K 3.5 3.1*   < > 2.9* 3.7  --  3.8 4.6  5.2*  CL 93* 82*   < > 99 107  --  107 107 109  CO2 23 11*   < > 23 20*  --  _0 GLUCOSE 554* 1,056*   < > 448* 268*  --  192* 231* 259*  BUN 33* 22*   < > 17 16  --  21* 30* 36*  CREATININE 1.11* 1.15*   < > 0.80 0.68  --  0.67 0.76 0.67  CALCIUM 7.3* 7.0*   < > 7.0* 6.6*  --  6.6* 6.9* 7.1*  MG  --  2.9*  --   --   --  2.3  --  2.1 2.2  AST 100* 246*  --   --   --   --  101* 62* 303*  ALT 130* 299*  --   --   --   --  173* 129* 181*  ALKPHOS 139* 748*  --   --   --   --  438* 459* 544*  BILITOT 0.8 1.7*  --   --   --   --  0.4 0.6 0.6   < > = values in this interval not displayed.   ------------------------------------------------------------------------------------------------------------------ No results for input(s): CHOL, HDL, LDLCALC, TRIG, CHOLHDL, LDLDIRECT in the last 72 hours.  Lab Results  Component Value Date   HGBA1C 14.7 (H) 11/30/2020   ------------------------------------------------------------------------------------------------------------------ No results for input(s): TSH, T4TOTAL, T3FREE, THYROIDAB in the last 72 hours.  Invalid input(s): FREET3 ------------------------------------------------------------------------------------------------------------------ Recent Labs  12/02/20 0510 12/03/20 0459  FERRITIN 225 466*    Coagulation profile No results for input(s): INR, PROTIME in the last 168 hours.  Recent Labs    12/02/20 0510 12/03/20 0459  DDIMER 1.67* 1.98*    Cardiac Enzymes No results for input(s): CKMB, TROPONINI, MYOGLOBIN in the last 168 hours.  Invalid input(s): CK ------------------------------------------------------------------------------------------------------------------    Component Value Date/Time   BNP 352.8 (H) 03/08/2019 1847     Roxan Hockey M.D on 12/03/2020 at 10:58 AM  Go to [email or web address] - for contact info  [facility] - Office  [phone]

## 2020-12-03 NOTE — Progress Notes (Signed)
At 1210 patient was extubated per MD. Rt extubated patient 20 5L O2 via nasal cannula . Patient tolerating well, maintaining SATs 98%, HR 78 and RR 25. RN at bedside, RT will continue to monitor and assess.

## 2020-12-03 NOTE — Progress Notes (Signed)
NAME:  [name], MRN:  [id number], DOB:  06/28/1986, LOS: 3 ADMISSION DATE:  11/30/2020, CONSULTATION DATE:  12/03/2020  REFERRING MD:  Gwendolyn Lima, CHIEF COMPLAINT: Respiratory failure requiring mechanical ventilation  Brief History:  35 year old with history of IDDM and cocaine use, admitted with pneumonia and due to Covid infection and DKA, worsening hypoxia on 2/14 requiring mechanical ventilation  History of Present Illness:  History is obtained from chart review since patient is currently intubated and sedated. She has a history of active cocaine use and prior episodes of DKA.  She is reported to be vaccinated for Covid but not boosted. ED visit on 2/12 for nausea and vomiting, chest x-ray showed bilateral bronchopneumonia CT abdomen showed masslike consolidation right upper and lower lobe with trace right effusion, but no intra-abdominal process.  She was discharged with cefdinir and azithromycin. She returned on 2/14 via EMS with hypoxia, saturation 66% on room air and CBGs more than 600.  She required nonrebreather in the ED, Covid PCR was positive, initial labs showed lactate of 6.8 and DKA with CBG greater than 1000.  LFTs were elevated.  Prolonged QT was noted on EKG. she admitted to cocaine use 3 days prior to admission, surprisingly urine toxicology was negative She was aggressively resuscitated with fluids and IV insulin, started on remdesivir and Solu-Medrol and admitted to the ICU right upper quadrant ultrasound did not show any active process She transitioned from nonrebreather to heated high flow oxygen to BiPAP and was eventually intubated on 2/14 for hypoxic/hypercarbic respiratory failure    Past Medical History:  IDDM GERD Cocaine use  Significant Hospital Events:  2/14 intubated  Consults:    Procedures:  ETT 2/14 >> RIJ 2/15 >> [place] thora 2/15 >> 16cc "cloudy" fluid removed  Significant Diagnostic Tests:  CT abdomen 2/12 masslike consolidation right  upper and lower lobe with trace right effusion, stenosis of celiac artery similar to that noted in 2018  RUQ [place] 2/14 >> no CBD dilatation, fatty liver  CT chest without contrast 2/17 bilateral small effusions + ascites , Underlying emphysematous changes with superimposed severe interstitial process in the upper lobes with areas of cystic change which could be bronchiectasis or pneumatoceles.  Dense airspace consolidation in both lower lobes with suspected areas of mild cavitation most notably in the right lower lobe medially.  Micro Data:  [facility] 2/14 >>ng Pleural fluid 2/15 >> staph >> MRSA PCR POS resp 2/15 >> Urine strep antigen negative  Antimicrobials:  Cefepime 2/14 >> vanc 2/15 >>   Interim History / Subjective:   Remains critically ill. Nonspecific agitation on wake-up assessment this morning, switch to propofol. Afebrile Only to 75 cc urine output documented   Objective   Blood pressure 125/88, pulse 78, temperature 99.9 F (37.7 C), temperature source Axillary, resp. rate 20, height 5' (1.524 m), weight 55.5 kg, last menstrual period 11/25/2020, SpO2 100 %.    Vent Mode: CPAP FiO2 (%):  [40 %-60 %] 40 % Set Rate:  [19 bmp-20 bmp] 19 bmp Vt Set:  [440 mL] 440 mL PEEP:  [5 cmH20] 5 cmH20 Pressure Support:  [5 cmH20-10 cmH20] 10 cmH20 Plateau Pressure:  [20 cmH20] 20 cmH20   Intake/Output Summary (Last 24 hours) at 12/03/2020 1442 Last data filed at 12/03/2020 1305 Gross per 24 hour  Intake 2205.75 ml  Output 950 ml  Net 1255.75 ml   Filed Weights   11/30/20 0846 12/02/20 0346  Weight: 49 kg 55.5 kg    Examination: General: Young  woman, intubated and sedated, no distress HENT: Mild pallor, no icterus, no JVD Lungs: No accessory muscle use, lateral ventilated breath sounds Cardiovascular: S1-S2 regular, no murmur Abdomen: Soft, nontender, no organomegaly Extremities: No deformity, no track marks on skin Neuro: Sedated, RASS -2, does not follow commands GU:  Clear urine  Chest x-ray independently reviewed from 2/17 bilateral small effusions, minimal layering  Resolved Hospital Problem list     Assessment & Plan:  Her presentation seems to be more compatible with multilobar bronchopneumonia with a right parapneumonic effusion, that seems to have increased from initial presentation on 2/12.  Differential is aspiration related to vomiting which may have been induced by cocaine use and known celiac artery stenosis -She is now known to have staph and pleural fluid in appearance of pneumatoceles and cavitary pneumonia on chest x-ray raises the question of staph pneumonia?  Disseminated staph   Acute hypoxic/hypercarbic respiratory failure Mild ARDS range hypoxia -improved -P/F ratio is much improved, plateau and driving pressure acceptable -She tolerated spontaneous breathing trials and was extubated to nasal cannula.  I examined her again post extubation, tolerating well, no accessory muscle use  Pneumatoceles with cavitary pneumonia-treating as HAP with cefepime +vancomycin, aspiration probable Right empyema/staph -Obtain echo to rule out endocarditis -We will ask T CTS to opine given very small effusions   COVID-19 positive test (U07.1, COVID-19) with Acute Pneumonia (J12.89, Other viral pneumonia) -Remdesivir  per [facility] -Drop steroids rapidly since I do not think pneumonia is due to Covid -Not a candidate for Actemra due to ongoing infection   DKA -beta hydroxybutyrate and anion gap resolved, transitioned to subcu insulin   Elevated LFTs -unclear cause , ultrasound showed fatty liver -Trend  Acute metabolic encephalopathy -due to hypoxia and hypercarbia, admitted to cocaine use 3 days PTA although UDS was negative -Using Precedex, goal RASS zero  Best practice (evaluated daily)  Diet: Tube feeds Pain/Anxiety/Delirium protocol (if indicated): Precedex VAP protocol (if indicated): Y DVT prophylaxis: Lovenox GI prophylaxis:  Protonix Glucose control: Insulin SQ Mobility: Bedrest Disposition: ICU  Goals of Care:   Code Status: Full code  Labs   CBC: Recent Labs  Lab 11/30/20 0856 11/30/20 1340 12/01/20 0700 12/02/20 0510 12/03/20 0459  WBC 5.3 6.3 8.7 7.2 5.8  NEUTROABS 4.6  --  7.3 6.0 4.1  HGB 12.2 11.6* 10.2* 9.9* 10.5*  HCT 39.9 34.7* 29.4* 28.5* 30.9*  MCV 96.4 88.7 85.5 84.8 86.3  PLT 273 195 167 126* 113*    Basic Metabolic Panel: Recent Labs  Lab 11/30/20 0856 11/30/20 1340 11/30/20 1632 11/30/20 2125 12/01/20 0700 12/01/20 0900 12/02/20 0510 12/03/20 0459  NA 123*   < > 134* 136  --  137 139 141  K 3.1*   < > 2.9* 3.7  --  3.8 4.6 5.2*  CL 82*   < > 99 107  --  107 107 109  CO2 11*   < > 23 20*  --  24 26 27   GLUCOSE 1,056*   < > 448* 268*  --  192* 231* 259*  BUN 22*   < > 17 16  --  21* 30* 36*  CREATININE 1.15*   < > 0.80 0.68  --  0.67 0.76 0.67  CALCIUM 7.0*   < > 7.0* 6.6*  --  6.6* 6.9* 7.1*  MG 2.9*  --   --   --  2.3  --  2.1 2.2   < > = values in this interval not displayed.  GFR: Estimated Creatinine Clearance: 76 mL/min (by C-G formula based on SCr of 0.67 mg/dL). Recent Labs  Lab 11/30/20 0856 11/30/20 1129 11/30/20 1340 11/30/20 1809 11/30/20 2125 12/01/20 0700 12/02/20 0510 12/03/20 0459  PROCALCITON 32.32  --   --   --   --  15.87  --   --   WBC 5.3  --  6.3  --   --  8.7 7.2 5.8  LATICACIDVEN 6.8* 3.5*  --  2.1* 2.7*  --   --   --     Liver Function Tests: Recent Labs  Lab 11/27/20 1958 11/30/20 0856 12/01/20 0900 12/02/20 0510 12/03/20 0459  AST 100* 246* 101* 62* 303*  ALT 130* 299* 173* 129* 181*  ALKPHOS 139* 748* 438* 459* 544*  BILITOT 0.8 1.7* 0.4 0.6 0.6  PROT 7.2 6.2* 4.8* 4.8* 4.9*  ALBUMIN 3.1* 2.6* 1.6* 1.5* 1.6*   Recent Labs  Lab 11/27/20 1958  LIPASE 18   No results for input(s): AMMONIA in the last 168 hours.     Critical care time: 7 m     Cyril Mourning MD. Tonny Bollman. [facility] Pager  : [phone]  If no response to pager , please call [phone] until 7 pm After 7:00 pm call Elink  [phone]   12/03/2020

## 2020-12-03 NOTE — Progress Notes (Signed)
*  PRELIMINARY RESULTS* Echocardiogram 2D Echocardiogram has been performed.  [name] 12/03/2020, 3:51 PM

## 2020-12-04 ENCOUNTER — Inpatient Hospital Stay: Payer: Self-pay

## 2020-12-04 ENCOUNTER — Inpatient Hospital Stay (HOSPITAL_COMMUNITY): Payer: Medicaid Other

## 2020-12-04 DIAGNOSIS — R6521 Severe sepsis with septic shock: Secondary | ICD-10-CM | POA: Diagnosis not present

## 2020-12-04 DIAGNOSIS — A419 Sepsis, unspecified organism: Secondary | ICD-10-CM | POA: Diagnosis not present

## 2020-12-04 DIAGNOSIS — J869 Pyothorax without fistula: Secondary | ICD-10-CM | POA: Diagnosis not present

## 2020-12-04 DIAGNOSIS — U071 COVID-19: Secondary | ICD-10-CM | POA: Diagnosis not present

## 2020-12-04 LAB — CBC WITH DIFFERENTIAL/PLATELET
Abs Immature Granulocytes: 0.06 10*3/uL (ref 0.00–0.07)
Basophils Absolute: 0.1 10*3/uL (ref 0.0–0.1)
Basophils Relative: 0 %
Eosinophils Absolute: 0 10*3/uL (ref 0.0–0.5)
Eosinophils Relative: 0 %
HCT: 31.9 % — ABNORMAL LOW (ref 36.0–46.0)
Hemoglobin: 10.6 g/dL — ABNORMAL LOW (ref 12.0–15.0)
Immature Granulocytes: 1 %
Lymphocytes Relative: 16 %
Lymphs Abs: 2.1 10*3/uL (ref 0.7–4.0)
MCH: 29.1 pg (ref 26.0–34.0)
MCHC: 33.2 g/dL (ref 30.0–36.0)
MCV: 87.6 fL (ref 80.0–100.0)
Monocytes Absolute: 0.4 10*3/uL (ref 0.1–1.0)
Monocytes Relative: 3 %
Neutro Abs: 10.7 10*3/uL — ABNORMAL HIGH (ref 1.7–7.7)
Neutrophils Relative %: 80 %
Platelets: 108 10*3/uL — ABNORMAL LOW (ref 150–400)
RBC: 3.64 MIL/uL — ABNORMAL LOW (ref 3.87–5.11)
RDW: 16.8 % — ABNORMAL HIGH (ref 11.5–15.5)
WBC: 13.3 10*3/uL — ABNORMAL HIGH (ref 4.0–10.5)
nRBC: 0.2 % (ref 0.0–0.2)

## 2020-12-04 LAB — C-REACTIVE PROTEIN: CRP: 2.2 mg/dL — ABNORMAL HIGH (ref <1.0)

## 2020-12-04 LAB — COMPREHENSIVE METABOLIC PANEL
ALT: 118 U/L — ABNORMAL HIGH (ref 0–44)
AST: 57 U/L — ABNORMAL HIGH (ref 15–41)
Albumin: 1.5 g/dL — ABNORMAL LOW (ref 3.5–5.0)
Alkaline Phosphatase: 399 U/L — ABNORMAL HIGH (ref 38–126)
Anion gap: 3 — ABNORMAL LOW (ref 5–15)
BUN: 27 mg/dL — ABNORMAL HIGH (ref 6–20)
CO2: 29 mmol/L (ref 22–32)
Calcium: 7.2 mg/dL — ABNORMAL LOW (ref 8.9–10.3)
Chloride: 111 mmol/L (ref 98–111)
Creatinine, Ser: 0.51 mg/dL (ref 0.44–1.00)
GFR, Estimated: >60 mL/min (ref >60)
Glucose, Bld: 143 mg/dL — ABNORMAL HIGH (ref 70–99)
Potassium: 4.3 mmol/L (ref 3.5–5.1)
Sodium: 143 mmol/L (ref 135–145)
Total Bilirubin: 0.7 mg/dL (ref 0.3–1.2)
Total Protein: 4.6 g/dL — ABNORMAL LOW (ref 6.5–8.1)

## 2020-12-04 LAB — FERRITIN: Ferritin: 389 ng/mL — ABNORMAL HIGH (ref 11–307)

## 2020-12-04 LAB — BODY FLUID CULTURE W GRAM STAIN: Special Requests: NORMAL

## 2020-12-04 LAB — GLUCOSE, CAPILLARY
Glucose-Capillary: 35 mg/dL — CL (ref 70–99)
Glucose-Capillary: 59 mg/dL — ABNORMAL LOW (ref 70–99)
Glucose-Capillary: 86 mg/dL (ref 70–99)
Glucose-Capillary: 124 mg/dL — ABNORMAL HIGH (ref 70–99)
Glucose-Capillary: 165 mg/dL — ABNORMAL HIGH (ref 70–99)
Glucose-Capillary: 134 mg/dL — ABNORMAL HIGH (ref 70–99)

## 2020-12-04 LAB — D-DIMER, QUANTITATIVE: D-Dimer, Quant: 3.29 ug/mL-FEU — ABNORMAL HIGH (ref 0.00–0.50)

## 2020-12-04 LAB — MAGNESIUM: Magnesium: 2 mg/dL (ref 1.7–2.4)

## 2020-12-04 LAB — CYTOLOGY - NON PAP

## 2020-12-04 MED ORDER — GABAPENTIN 300 MG PO CAPS
600.0000 mg | ORAL_CAPSULE | Freq: Three times a day (TID) | ORAL | Status: DC
  Administered 2020-12-05 – 2020-12-07 (×6): 600 mg via ORAL
  Filled 2020-12-04 (×6): qty 2

## 2020-12-04 MED ORDER — INSULIN ASPART 100 UNIT/ML ~~LOC~~ SOLN: 0.0000 [IU] | Freq: Every day | SUBCUTANEOUS | Status: DC

## 2020-12-04 MED ORDER — LORAZEPAM 2 MG/ML IJ SOLN
1.0000 mg | INTRAMUSCULAR | Status: DC | PRN
  Administered 2020-12-04 – 2020-12-08 (×10): 1 mg via INTRAVENOUS
  Filled 2020-12-04 (×10): qty 1

## 2020-12-04 MED ORDER — VANCOMYCIN HCL 750 MG/150ML IV SOLN
750.0000 mg | Freq: Two times a day (BID) | INTRAVENOUS | Status: DC
  Administered 2020-12-04 – 2020-12-06 (×4): 750 mg via INTRAVENOUS
  Filled 2020-12-04 (×4): qty 150

## 2020-12-04 MED ORDER — MIDAZOLAM 50MG/50ML (1MG/ML) PREMIX INFUSION: 2.0000 mg/h | INTRAVENOUS | Status: DC

## 2020-12-04 MED ORDER — INSULIN ASPART 100 UNIT/ML ~~LOC~~ SOLN
0.0000 [IU] | Freq: Three times a day (TID) | SUBCUTANEOUS | Status: DC
  Administered 2020-12-04: 2 [IU] via SUBCUTANEOUS
  Administered 2020-12-05 (×2): 1 [IU] via SUBCUTANEOUS
  Administered 2020-12-06 (×2): 2 [IU] via SUBCUTANEOUS
  Administered 2020-12-06: 5 [IU] via SUBCUTANEOUS
  Administered 2020-12-07: 2 [IU] via SUBCUTANEOUS
  Administered 2020-12-07: 5 [IU] via SUBCUTANEOUS
  Administered 2020-12-07 – 2020-12-08 (×2): 3 [IU] via SUBCUTANEOUS
  Administered 2020-12-08 – 2020-12-09 (×3): 2 [IU] via SUBCUTANEOUS
  Administered 2020-12-10: 7 [IU] via SUBCUTANEOUS
  Administered 2020-12-10: 5 [IU] via SUBCUTANEOUS

## 2020-12-04 MED ORDER — GLUCERNA SHAKE PO LIQD
237.0000 mL | Freq: Four times a day (QID) | ORAL | Status: DC
  Administered 2020-12-05 – 2020-12-12 (×9): 237 mL via ORAL

## 2020-12-04 MED ORDER — METRONIDAZOLE IN NACL 5-0.79 MG/ML-% IV SOLN
500.0000 mg | Freq: Three times a day (TID) | INTRAVENOUS | Status: DC
  Administered 2020-12-04 – 2020-12-13 (×27): 500 mg via INTRAVENOUS
  Filled 2020-12-04 (×27): qty 100

## 2020-12-04 MED ORDER — SODIUM CHLORIDE 0.9% FLUSH: 10.0000 mL | INTRAVENOUS | Status: DC | PRN

## 2020-12-04 MED ORDER — SODIUM CHLORIDE 0.9% FLUSH
10.0000 mL | Freq: Two times a day (BID) | INTRAVENOUS | Status: DC
  Administered 2020-12-05 – 2020-12-10 (×12): 10 mL
  Administered 2020-12-11: 20 mL
  Administered 2020-12-11 – 2020-12-12 (×2): 10 mL

## 2020-12-04 NOTE — Progress Notes (Signed)
[facility] night shift.  The staff requested renewal of wrist restraints.  The patient is currently sleepy in bed and follows simple commands.  Lungs with scattered rhonchi.  Cardiovascular S1-S2.  Abdomen soft nontender.  Extremities showed no edema.  Wrist restraints were renewed for patient's safety.  Sanda Klein, MD.

## 2020-12-04 NOTE — Progress Notes (Signed)
Patient remains agitated. Managing to turn onto her side, almost onto her stomach and is throwing her legs over side rails in an attempt to get out of bed. Vitals unstable due to agitation. Patient kicking at staff and attempting to remove equipment.

## 2020-12-04 NOTE — Progress Notes (Signed)
Patient Demographics:    [name], is a 35 y.o. female, DOB - 1986-07-08, WPY:[id number]  Admit date - 11/30/2020   Admitting Physician Pratik Darleen Crocker, DO  Outpatient Primary MD for the patient is [facility] [place] [facility]   Chief Complaint  Patient presents with  . Weakness       . hypoxia        Subjective:    [name] was -Extubated 12/03/2020 currently on nasal cannula at 5 L/min - Still requiring IV Precedex and as needed lorazepam due to ongoing confusion and disorientation and pulling out lines -   Assessment  & Plan :    Principal Problem:   Severe MRSA sepsis with septic shock -secondary to community-acquired pneumonia with parapneumonic effusion/Empyema Active Problems:   DKA, type 1 ([place])   Pneumonia due to COVID-19 virus   Acute Hypoxic respiratory failure due to COVID-19 ([place])   Sepsis secondary to community-acquired pneumonia with parapneumonic effusion   CAP (community acquired pneumonia)--- with parapneumonic effusion/MRSA Empyema   Systolic CHF, acute ----EF 30 to 35 %   Rt Sided MRSA Empyema of pleura ([place])   Benzodiazepine abuse ([place])   Cocaine abuse ([place])   Substance induced mood disorder ([place])   Substance abuse ([place])   Tobacco abuse   Brief Summary:- 35 y.o. female with medical history significant for type 1 diabetes, GERD, and cocaine use admitted on 12/01/2020 with pathophysiology consistent with DKA-as well as AKI and acute hypoxic respiratory failure secondary to Covid pneumonia with transaminitis and electrolyte abnormalities, and found to have severe sepsis with shock secondary to bacterial pneumonia superimposed on Covid pneumonia, pleural fluid culture from 12/01/20 showing staph -Initially intubated 11/30/20,  extubated 12/03/2020  -- A/p 1)Acute hypoxic respiratory failure secondary to COVID-19 infection/Pneumonia--- The treatment plan  and use of medications  for treatment of COVID-19 infection and possible side effects were discussed with pts Husband -----[name] verbalizes understanding and agrees to treatment protocols   --Patient is positive for COVID-19 infection, chest x-ray with findings of infiltrates/opacities,  patient is tachypneic/hypoxic and requiring intubation and sedation  ---patient meets criteria for initiation of Remdesivir AND Steroid therapy per protocol  --Check and trend inflammatory markers including D-dimer, ferritin and  CRP---also follow CBC and CMP -Follow serial chest x-rays and ABGs as indicated --Attempt to maintain euvolemic state --Zinc and vitamin C as ordered -Albuterol inhaler as needed -Accu-Cheks/fingersticks while on high-dose steroids -PPI while on high-dose steroids -Currently not a candidate for Actemra due to concerns about acute superimposed bacterial infection -Extubated 12/03/2020 currently on nasal cannula at 5 L/min  COVID-19 Labs  Recent Labs    12/02/20 0510 12/03/20 0459 12/04/20 0409  DDIMER 1.67* 1.98* 3.29*  FERRITIN 225 466* 389*  CRP 5.4* 3.4* 2.2*    Lab Results  Component Value Date   SARSCOV2NAA POSITIVE (A) 11/30/2020   [place] NEGATIVE 04/06/2020   [place] NEGATIVE 03/08/2019   [place] NOT DETECTED 02/28/2019   2)Severe staphylococcal/MRSA sepsis with septic shock secondary to community-acquired pneumonia with parapneumonic effusion/Empyema--- -- POA--patient met sepsis criteria on admission with fevers, tachycardia, tachypnea, as well as profound hypoxia and hypotension as well as lactic acidosis -Status post diagnostic right-sided ultrasound thoracentesis on 12/01/2020 -Continue IV cefepime  and vancomycin pending blood cultures and pleural fluid studies (exudate)-- - pleural fluid culture from 12/01/20 showing   MRSA   -Leukocytosis has resolved -Weaned off  IV Levophed  -PCCM consult from Dr. Kara Mead appreciated -Repeat CT  chest without contrast done on 12/03/2020-findings were reviewed with CT surgery by Dr. Elsworth Soho  -CT surgery recommends antibiotic therapy rather than operative intervention -Discussed with ID pharmacist who recommends :-stop the cefepime and continue with vanc for 2 weeks, would also recommend adding flagyl, with these empyemas there is often anaerobes in there that we dont always isolate. After 2 weeks of vanc we could move to linezolid/flagyl as an option, her platelets arent the greatest right now but I suspect as her infection clears they will rebound to what they were on admission. Alternatively we could do something like doxycycline/flagyl but linezolid has a bit better oral bioavailability  -TTE without endocarditis, given empyema patient needs long-term antibiotic therapy, given polysubstance abuse patient may not be compliant   3)DKA-DKA pathophysiology appears to have resolved, -Off IV insulin drip, continue subcu insulin and IV fluids  4)-Prophylaxis-Protonix for GI prophylaxis Lovenox for DVT prophylaxis  5) elevated LFTs--- liver ultrasound with fatty liver otherwise no acute findings -12/03/20--LFTs are trending back up again may have been due to shock liver in the setting of hypotension -LFTs are trending down  Hepatic Function Latest Ref Rng & Units 12/04/2020 12/03/2020 12/02/2020  Total Protein 6.5 - 8.1 g/dL 4.6(L) 4.9(L) 4.8(L)  Albumin 3.5 - 5.0 g/dL 1.5(L) 1.6(L) 1.5(L)  AST 15 - 41 U/L 57(H) 303(H) 62(H)  ALT 0 - 44 U/L 118(H) 181(H) 129(H)  Alk Phosphatase 38 - 126 U/L 399(H) 544(H) 459(H)  Total Bilirubin 0.3 - 1.2 mg/dL 0.7 0.6 0.6  Bilirubin, Direct 0.0 - 0.2 mg/dL - - -    6) hyponatremia/hyperkalemia--hyponatremia has resolved , potassium is up to 5.2  7)COPD/tobacco abuse--- bronchodilators-ordered  -patient currently intubated and sedated  8) polysubstance abuse--- patient admitted to using cocaine 3 days PTA - UDS at the time of admission was already  negative -c/n gabapentin and methocarbamol to help blunt withdrawal symptoms  9)Social/Ethics--patient has a husband and 2 kids, husband is primary contact and primary decision maker- -patient's husband will keep on it for members updated on patient's condition and other questions -Patient remains a full code  10)Acute anemia and Acute thrombocytopenia--- in the setting of sepsis and hemodilution--- continue to monitor closely, no evidence of ongoing bleeding at this time   Disposition/Need for in-Hospital Stay- patient unable to be discharged at this time due to  -Severe staphylococcal sepsis with septic shock secondary to community-acquired pneumonia with parapneumonic effusion/Empyema-and DKA as well as Covid pneumonia requiring IV antibiotics, IV fluids,   Status is: Inpatient  Remains inpatient appropriate because:Please see above -Please see above  Disposition: The patient is from: Home              Anticipated d/c is to: Home              Anticipated d/c date is: > 3 days              Patient currently is not medically stable to d/c. Barriers: Not Clinically Stable-   Code Status :  -  Code Status: Full Code   Family Communication:   I called and updated patient's husband by phone  Consults  :  PCCM/IR/ID pharmacist  Procedures:- -Intubation on 11/30/2020 -Status post diagnostic right-sided ultrasound thoracentesis on 12/01/2020 -Right IJ  central line placement on 12/01/2020, removed 12/04/2020 -Extubated 12/03/2020 -PICC line placed 12/04/2020  DVT Prophylaxis  :   - SCDs   enoxaparin (LOVENOX) injection 40 mg Start: 12/02/20 1800    Lab Results  Component Value Date   PLT 108 (L) 12/04/2020    Inpatient Medications  Scheduled Meds: . Chlorhexidine Gluconate Cloth  6 each Topical Daily  . enoxaparin (LOVENOX) injection  40 mg Subcutaneous Q24H  . gabapentin  600 mg Oral TID  . insulin aspart  0-15 Units Subcutaneous TID WC  . insulin aspart  0-5 Units  Subcutaneous QHS  . insulin glargine  10 Units Subcutaneous QPM  . methocarbamol  750 mg Oral QID  . methylPREDNISolone (SOLU-MEDROL) injection  40 mg Intravenous Daily  . mupirocin ointment  1 application Nasal BID   Continuous Infusions: . dexmedetomidine (PRECEDEX) IV infusion 1.404 mcg/kg/hr (12/04/20 1003)  . famotidine (PEPCID) IV 20 mg (12/04/20 1141)  . lactated ringers 50 mL/hr at 12/04/20 0540  . metronidazole    . norepinephrine (LEVOPHED) Adult infusion Stopped (12/01/20 1817)  . vancomycin Stopped (12/04/20 0455)   PRN Meds:.acetaminophen (TYLENOL) oral liquid 160 mg/5 mL **OR** acetaminophen, dextrose, fentaNYL (SUBLIMAZE) injection, guaiFENesin-dextromethorphan, Ipratropium-Albuterol, LORazepam    Anti-infectives (From admission, onward)   Start     Dose/Rate Route Frequency Ordered Stop   12/04/20 1230  metroNIDAZOLE (FLAGYL) IVPB 500 mg        500 mg 100 mL/hr over 60 Minutes Intravenous Every 8 hours 12/04/20 1133     12/02/20 0315  vancomycin (VANCOREADY) IVPB 500 mg/100 mL       "Followed by" Linked Group Details   500 mg 100 mL/hr over 60 Minutes Intravenous Every 12 hours 12/01/20 1423     12/01/20 2000  ceFEPIme (MAXIPIME) 2 g in sodium chloride 0.9 % 100 mL IVPB  Status:  Discontinued        2 g 200 mL/hr over 30 Minutes Intravenous Every 8 hours 12/01/20 1423 12/04/20 1133   12/01/20 1500  vancomycin (VANCOCIN) IVPB 1000 mg/200 mL premix       "Followed by" Linked Group Details   1,000 mg 200 mL/hr over 60 Minutes Intravenous  Once 12/01/20 1423 12/01/20 1611   12/01/20 1000  remdesivir 100 mg in sodium chloride 0.9 % 100 mL IVPB       "Followed by" Linked Group Details   100 mg 200 mL/hr over 30 Minutes Intravenous Daily 11/30/20 1556 12/04/20 1103   11/30/20 1645  remdesivir 100 mg in sodium chloride 0.9 % 100 mL IVPB       "Followed by" Linked Group Details   100 mg 200 mL/hr over 30 Minutes Intravenous Every 30 min 11/30/20 1556 11/30/20 1756    11/30/20 1200  ceFEPIme (MAXIPIME) 2 g in sodium chloride 0.9 % 100 mL IVPB  Status:  Discontinued        2 g 200 mL/hr over 30 Minutes Intravenous Every 12 hours 11/30/20 1126 12/01/20 1423   11/30/20 0930  cefTRIAXone (ROCEPHIN) 1 g in sodium chloride 0.9 % 100 mL IVPB        1 g 200 mL/hr over 30 Minutes Intravenous  Once 11/30/20 0926 11/30/20 1016   11/30/20 0930  azithromycin (ZITHROMAX) 500 mg in sodium chloride 0.9 % 250 mL IVPB        500 mg 250 mL/hr over 60 Minutes Intravenous  Once 11/30/20 0926 11/30/20 1104        Objective:   Vitals:   12/04/20  0342 12/04/20 0400 12/04/20 0500 12/04/20 0600  BP: 125/90 (!) 125/95 (!) 135/93 (!) 140/95  Pulse: 61 61 60 (!) 59  Resp: _0 Temp: 97.6 F (36.4 C)     TempSrc: Axillary     SpO2: 100% 94% 91% 98%  Weight:      Height:        Wt Readings from Last 3 Encounters:  12/02/20 55.5 kg  11/27/20 49.9 kg  04/07/20 47.6 kg     Intake/Output Summary (Last 24 hours) at 12/04/2020 1149 Last data filed at 12/04/2020 0540 Gross per 24 hour  Intake 1638.1 ml  Output 1450 ml  Net 188.1 ml    Physical Exam  Gen:-Mostly sleepy, does wake up and follows simple commands HEENT:- [place].AT, No sclera icterus Neck-right IJ central line--removed 12/04/2020 Nose- [place] 5L/min Lungs-diminished breath sounds bilaterally with few scattered rhonchi CV- S1, S2 normal, regular  Abd-  +ve B.Sounds, Abd Soft,ND,    Extremity/Skin:- No  edema, pedal pulses present  NeuroPsych--Limited exam as patient is on IV Precedex and as needed lorazepam GU-Foley in situ-to be removed 12/04/2020   Data Review:   Micro Results Recent Results (from the past 240 hour(s))  Blood Culture (routine x 2)     Status: None (Preliminary result)   Collection Time: 11/30/20  8:49 AM   Specimen: BLOOD LEFT ARM  Result Value Ref Range Status   Specimen Description BLOOD LEFT ARM DRAWN BY RN  Final   Special Requests   Final    BOTTLES DRAWN AEROBIC AND  ANAEROBIC Blood Culture results may not be optimal due to an inadequate volume of blood received in culture bottles   Culture   Final    NO GROWTH 4 DAYS Performed at [facility], [address], [place], [place] [address]    Report Status PENDING  Incomplete  Resp Panel by RT-PCR (Flu A&B, Covid) Nasopharyngeal Swab     Status: Abnormal   Collection Time: 11/30/20  8:56 AM   Specimen: Nasopharyngeal Swab; Nasopharyngeal(NP) swabs in vial transport medium  Result Value Ref Range Status   SARS Coronavirus 2 by RT PCR POSITIVE (A) NEGATIVE Final    Comment: RESULT CALLED TO, READ BACK BY AND VERIFIED WITH: CRAWFORD H. AT 1049AM ON [id number] BY THOMPSON S. (NOTE) SARS-CoV-2 target nucleic acids are DETECTED.  The SARS-CoV-2 RNA is generally detectable in upper respiratory specimens during the acute phase of infection. Positive results are indicative of the presence of the identified virus, but do not rule out bacterial infection or co-infection with other pathogens not detected by the test. Clinical correlation with patient history and other diagnostic information is necessary to determine patient infection status. The expected result is Negative.  Fact Sheet for Patients: EntrepreneurPulse.com.au  Fact Sheet for Healthcare Providers: IncredibleEmployment.be  This test is not yet approved or cleared by the [place] FDA and  has been authorized for detection and/or diagnosis of SARS-CoV-2 by FDA under an Emergency Use Authorization (EUA).  This EUA will remain in effect (meaning thi s test can be used) for the duration of  the COVID-19 declaration under Section 564(b)(1) of the Act, 21 U.S.C. section 360bbb-3(b)(1), unless the authorization is terminated or revoked sooner.     Influenza A by PCR NEGATIVE NEGATIVE Final   Influenza B by PCR NEGATIVE NEGATIVE Final    Comment: (NOTE) The Xpert Xpress SARS-CoV-2/FLU/RSV plus assay is intended as  an aid in the diagnosis of influenza from Nasopharyngeal swab  specimens and should not be used as a sole basis for treatment. Nasal washings and aspirates are unacceptable for Xpert Xpress SARS-CoV-2/FLU/RSV testing.  Fact Sheet for Patients: EntrepreneurPulse.com.au  Fact Sheet for Healthcare Providers: IncredibleEmployment.be  This test is not yet approved or cleared by the [place] FDA and has been authorized for detection and/or diagnosis of SARS-CoV-2 by FDA under an Emergency Use Authorization (EUA). This EUA will remain in effect (meaning this test can be used) for the duration of the COVID-19 declaration under Section 564(b)(1) of the Act, 21 U.S.C. section 360bbb-3(b)(1), unless the authorization is terminated or revoked.  Performed at [facility], [address], [place], [place] [address]   Blood Culture (routine x 2)     Status: None (Preliminary result)   Collection Time: 11/30/20  9:21 AM   Specimen: BLOOD LEFT FOREARM  Result Value Ref Range Status   Specimen Description BLOOD LEFT FOREARM  Final   Special Requests   Final    BOTTLES DRAWN AEROBIC AND ANAEROBIC Blood Culture adequate volume   Culture   Final    NO GROWTH 4 DAYS Performed at [facility], [address] [place], [place] [address]    Report Status PENDING  Incomplete  MRSA PCR Screening     Status: Abnormal   Collection Time: 11/30/20 12:30 PM   Specimen: Nasopharyngeal  Result Value Ref Range Status   MRSA by PCR POSITIVE (A) NEGATIVE Final    Comment:        The GeneXpert MRSA Assay (FDA approved for NASAL specimens only), is one component of a comprehensive MRSA colonization surveillance program. It is not intended to diagnose MRSA infection nor to guide or monitor treatment for MRSA infections. RESULT CALLED TO, READ BACK BY AND VERIFIED WITH: ASHLEY SHELTON 2/14 @ 1915 BY Bolivar Haw Performed at [facility], [address] [place], [place]  [address]   Gram stain     Status: None   Collection Time: 12/01/20  2:02 PM   Specimen: Pleura  Result Value Ref Range Status   Specimen Description PLEURAL  Final   Special Requests NONE  Final   Gram Stain   Final    CYTOSPIN SMEAR NO ORGANISMS SEEN WBC PRESENT, PREDOMINANTLY PMN Performed at [facility], [address], [place], [place] [address]    Report Status 12/01/2020 FINAL  Final  Body fluid culture w Gram Stain     Status: None   Collection Time: 12/01/20  2:04 PM   Specimen: Pleura; Body Fluid  Result Value Ref Range Status   Specimen Description   Final    PLEURAL Performed at [facility], [address] [place], [place] [address]    Special Requests   Final    Normal Performed at [facility], [address] [place], [place] [address]    Gram Stain   Final    MODERATE WBC PRESENT, PREDOMINANTLY PMN NO ORGANISMS SEEN    Culture   Final    RARE METHICILLIN RESISTANT STAPHYLOCOCCUS AUREUS CRITICAL RESULT CALLED TO, READ BACK BY AND VERIFIED WITH: RN A.LARIMORE AT 1000 ON 12/03/2020 BY T.SAAD Performed at [facility], [place] [address] [place],  [address]    Report Status 12/04/2020 FINAL  Final   Organism ID, Bacteria METHICILLIN RESISTANT STAPHYLOCOCCUS AUREUS  Final      Susceptibility   Methicillin resistant staphylococcus aureus - MIC*    CIPROFLOXACIN >=8 RESISTANT Resistant     ERYTHROMYCIN >=8 RESISTANT Resistant  GENTAMICIN <=0.5 SENSITIVE Sensitive     OXACILLIN >=4 RESISTANT Resistant     TETRACYCLINE <=1 SENSITIVE Sensitive     VANCOMYCIN 1 SENSITIVE Sensitive     TRIMETH/SULFA <=10 SENSITIVE Sensitive     CLINDAMYCIN <=0.25 SENSITIVE Sensitive     RIFAMPIN <=0.5 SENSITIVE Sensitive     Inducible Clindamycin NEGATIVE Sensitive     * RARE METHICILLIN RESISTANT STAPHYLOCOCCUS AUREUS   Radiology Reports CT CHEST WO CONTRAST  Result Date: 12/03/2020 CLINICAL DATA:  COVID pneumonia. EXAM: CT CHEST WITHOUT CONTRAST TECHNIQUE:  Multidetector CT imaging of the chest was performed following the standard protocol without IV contrast. COMPARISON:  Chest CT 05/18/2017 FINDINGS: Cardiovascular: The heart is normal in size. No pericardial effusion. The endotracheal tube is in good position just above the carina. There is an NG tube coursing down the esophagus and into the stomach. The right IJ central venous catheter is in good position. Mediastinum/Nodes: Borderline enlarged mediastinal and hilar lymph nodes likely reactive/inflammatory. Lungs/Pleura: Underlying emphysematous changes are noted with superimposed severe interstitial process in the upper lobes with areas of cystic change which could be bronchiectasis or pneumatocele see. There is fairly dense airspace consolidation in both lower lobes with suspected areas of mild cavitation most notably in the right lower lobe medially. There are also small bilateral pleural effusions, right larger than left. No pneumothorax or pneumomediastinum. Upper Abdomen: Upper abdominal ascites is noted with fluid around the gallbladder, in the hepato renal fossa and pericolic gutters. Musculoskeletal: No significant bony findings. IMPRESSION: 1. Underlying emphysematous changes with superimposed severe interstitial process in the upper lobes with areas of cystic change which could be bronchiectasis or pneumatoceles. 2. Dense airspace consolidation in both lower lobes with suspected areas of mild cavitation most notably in the right lower lobe medially. 3. Small bilateral pleural effusions, right larger than left. 4. Upper abdominal ascites. 5. Lines and tubes in good position without complicating features. Emphysema (ICD10-J43.9). Electronically Signed   By: Marijo Sanes M.D.   On: 12/03/2020 13:20   CT ABDOMEN PELVIS W CONTRAST  Result Date: 11/28/2020 CLINICAL DATA:  Abdominal pain, biliary obstruction suspected. EXAM: CT ABDOMEN AND PELVIS WITH CONTRAST TECHNIQUE: Multidetector CT imaging of the  abdomen and pelvis was performed using the standard protocol following bolus administration of intravenous contrast. CONTRAST:  122mL OMNIPAQUE IOHEXOL 300 MG/ML  SOLN COMPARISON:  CT abdomen pelvis 04/10/2017, CT angio chest 05/18/2017 FINDINGS: Lower chest: Partially visualized trace right pleural effusion. Associated passive atelectasis of the right lower lobe. Dependent right lower lobe consolidation. Masslike right upper and right lower lobe consolidations (4:3, 5:33). Hepatobiliary: The liver measures at upper limits of normal in size. No focal liver abnormality. No gallstones, gallbladder wall thickening, or pericholecystic fluid. No biliary dilatation. Pancreas: No focal lesion. Normal pancreatic contour. No surrounding inflammatory changes. No main pancreatic ductal dilatation. Spleen: Normal in size without focal abnormality. Adrenals/Urinary Tract: No adrenal nodule bilaterally. Bilateral kidneys enhance symmetrically. No hydronephrosis. No hydroureter. The urinary bladder is unremarkable. Stomach/Bowel: Fluid level within the gastric lumen likely related to ingested material. Stomach is within normal limits. Prominent but not dilated loop of small bowel within the lower anterior abdomen with no definite transition point. No pneumatosis. No evidence of bowel wall thickening or dilatation. Appendix appears normal. Vascular/Lymphatic: No abdominal aorta or iliac aneurysm. 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 No abdominal, pelvic, or inguinal lymphadenopathy. 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 Uterus and bilateral adnexa are unremarkable.  Other: Trace simple free fluid within the pelvis that could be physiologic in etiology. No intraperitoneal free gas. No organized fluid collection. Musculoskeletal: No acute or significant osseous findings. IMPRESSION: 1. Dependent right lower lobe consolidation. Masslike right upper and right lower lobe  consolidation. Findings likely represents infection/inflammation. Recommend repeat chest x-ray PA and lateral in 3-4 weeks to exclude underlying malignancy. 2. Trace volume right pleural effusion. 3. 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 Electronically Signed   By: Iven Finn M.D.   On: 11/28/2020 01:35   DG CHEST PORT 1 VIEW  Result Date: 12/02/2020 CLINICAL DATA:  Intubated, COVID EXAM: PORTABLE CHEST 1 VIEW COMPARISON:  12/01/2020 FINDINGS: Endotracheal tube is 3 cm above the carina. NG tube is in the stomach. Layering bilateral pleural effusions. Interstitial prominence throughout the lungs. Bilateral lower lobe airspace disease. No real change since prior study. IMPRESSION: Persistent interstitial prominence and pulmonary infiltrates. Layering bilateral effusions. No change. Electronically Signed   By: Rolm Baptise M.D.   On: 12/02/2020 06:33   DG Chest Portable 1 View  Result Date: 12/01/2020 CLINICAL DATA:  RIGHT pleural effusion, COVID-19, sepsis EXAM: PORTABLE CHEST 1 VIEW COMPARISON:  Portable exam at 1345 hrs compared to 0921 hrs FINDINGS: Tip of endotracheal tube projects 1.9 cm above carina. NG tube extends into stomach. Stable heart size and mediastinal contours normal. Persistent diffuse pulmonary infiltrates unchanged. Persistent small RIGHT pleural effusion. No pneumothorax following RIGHT thoracentesis. IMPRESSION: No pneumothorax following diagnostic RIGHT thoracentesis. Persistent pulmonary infiltrates and small RIGHT pleural effusion. Electronically Signed   By: Lavonia Dana M.D.   On: 12/01/2020 14:20   DG CHEST PORT 1 VIEW  Result Date: 12/01/2020 CLINICAL DATA:  35 year old ventilated female status post central line placement. EXAM: PORTABLE CHEST 1 VIEW COMPARISON:  Chest x-ray 11/30/2020. FINDINGS: An endotracheal tube is in place with tip 3.3 cm above the carina. There is a right-sided internal jugular central venous catheter with tip  terminating in the distal superior vena cava. Widespread areas of ill-defined opacities and areas of interstitial prominence are noted throughout the lungs bilaterally, most evident throughout the mid to lower lungs, minimally improved compared to the prior examination. Moderate right and small left pleural effusions. No pneumothorax. Pulmonary vasculature is largely obscured. Heart size is normal. The patient is rotated to the left on today's exam, resulting in distortion of the mediastinal contours and reduced diagnostic sensitivity and specificity for mediastinal pathology. IMPRESSION: 1. Support apparatus, as above. 2. The appearance of the lungs is most compatible with severe multilobar bilateral pneumonia related to known COVID infection. Overall, aeration has slightly improved compared to the prior study. 3. Moderate right and small left pleural effusions similar to the prior examination. Electronically Signed   By: Vinnie Langton M.D.   On: 12/01/2020 09:37   DG CHEST PORT 1 VIEW  Result Date: 11/30/2020 CLINICAL DATA:  COVID-19 positive, tobacco abuse, intubated EXAM: PORTABLE CHEST 1 VIEW COMPARISON:  11/30/2020, 11/28/2020 FINDINGS: Single frontal view of the chest demonstrates interval placement of endotracheal tube overlying tracheal air column, tip 1.7 cm above carina. Enteric catheter passes below diaphragm tip excluded by collimation, side port projecting over the gastric body. Multifocal bilateral ground-glass airspace disease has progressed since prior study, which could reflect progressive pneumonia or superimposed edema. Stable right pleural effusion. No pneumothorax. IMPRESSION: 1. Support devices as above. 2. Progressive diffuse ground-glass airspace disease, which may reflect worsening pneumonia or superimposed edema. 3. Stable right pleural effusion. Electronically Signed  By: Randa Ngo M.D.   On: 11/30/2020 20:07   DG Chest Port 1 View  Result Date: 11/30/2020 CLINICAL DATA:   Shortness of breath, recent pneumonia in a 35 year old female EXAM: PORTABLE CHEST 1 VIEW COMPARISON:  November 28, 2020 FINDINGS: Interstitial and airspace disease worse than on the prior study, superimposed on a background of pulmonary emphysema. Interval development of a small RIGHT-sided pleural effusion. Trachea midline. Cardiomediastinal contours and hilar structures are stable. Nodular component to the airspace process with focal areas of nodularity the for example in the RIGHT upper and LEFT lower lobe. No acute skeletal process on limited assessment. IMPRESSION: 1. Worsening interstitial and airspace disease superimposed on a background of pulmonary emphysema. Findings suspicious for multifocal pneumonia. Given nodular component would suggest follow-up to ensure resolution and exclude underlying pulmonary nodules. 2. New small RIGHT-sided pleural effusion. Could also consider the possibility of an overlay of heart failure. Electronically Signed   By: Zetta Bills M.D.   On: 11/30/2020 09:21   DG Chest Port 1 View  Result Date: 11/28/2020 CLINICAL DATA:  Nausea, vomiting, fever and cough. EXAM: PORTABLE CHEST 1 VIEW COMPARISON:  04/06/2020 FINDINGS: Artifact overlies the chest. Heart size is normal. There are patchy pulmonary infiltrates in both lungs, more extensive on the right than the left, consistent with widespread bronchopneumonia. This could be bacterial or viral. No lobar consolidation or collapse. No pleural effusion. No significant bone finding. IMPRESSION: Widespread bilateral bronchopneumonia, right more extensive than left. This could be bacterial or viral. Electronically Signed   By: Nelson Chimes M.D.   On: 11/28/2020 00:26   ECHOCARDIOGRAM COMPLETE  Result Date: 12/03/2020    ECHOCARDIOGRAM REPORT   Patient Name:   CARRINE KROBOTH Date of Exam: 12/03/2020 Medical Rec #:  [id number]         Height: Accession #:    [id number]        Weight: Date of Birth:  1986/04/12         BSA:  Patient Age:    70 years          BP:           120/89 mmHg Patient Gender: F                 HR:           77 bpm. Exam Location:  [facility] Procedure: 2D Echo Indications:    Endocarditis I38  History:        Patient has prior history of Echocardiogram examinations, most                 recent 03/09/2019. Signs/Symptoms:Murmur; Risk Factors:Current                 Smoker and Diabetes. Substance induced mood disorder ,                 Pneumonia, Cocaine Abuse.  Sonographer:    Leavy Cella RDCS (AE) Referring Phys: [place]  1. Left ventricular ejection fraction, by estimation, is 30 to 35%. The left ventricle has moderately decreased function. The left ventricle demonstrates regional wall motion abnormalities (see scoring diagram/findings for description). Left ventricular  diastolic parameters were normal.  2. Right ventricular systolic function is moderately reduced. The right ventricular size is normal. There is normal pulmonary artery systolic pressure. The estimated right ventricular systolic pressure is 85.2 mmHg.  3. The mitral valve is grossly normal. Mild to moderate mitral valve regurgitation.  4. The aortic valve is tricuspid. Aortic valve regurgitation is not visualized.  5. The inferior vena cava is normal in size with greater than 50% respiratory variability, suggesting right atrial pressure of 3 mmHg.  6. No obvious valvular vegetations. FINDINGS  Left Ventricle: Left ventricular ejection fraction, by estimation, is 30 to 35%. The left ventricle has moderately decreased function. The left ventricle demonstrates regional wall motion abnormalities. The left ventricular internal cavity size was normal in size. There is no left ventricular hypertrophy. Left ventricular diastolic parameters were normal.  LV Wall Scoring: The mid and distal anterior wall, mid and distal anterior septum, entire apex, mid and distal inferior wall, and mid inferoseptal segment are akinetic. The basal  anteroseptal segment, mid inferolateral segment, and mid anterolateral segment are hypokinetic. The basal inferolateral segment, basal anterolateral segment, basal anterior segment, basal inferior segment, and basal inferoseptal segment are normal. Right Ventricle: The right ventricular size is normal. No increase in right ventricular wall thickness. Right ventricular systolic function is moderately reduced. There is normal pulmonary artery systolic pressure. The tricuspid regurgitant velocity is 2.18 m/s, and with an assumed right atrial pressure of 3 mmHg, the estimated right ventricular systolic pressure is 80.9 mmHg. Left Atrium: Left atrial size was normal in size. Right Atrium: Right atrial size was normal in size. Pericardium: There is no evidence of pericardial effusion. Mitral Valve: The mitral valve is grossly normal. Mild to moderate mitral valve regurgitation, with eccentric posteriorly directed jet. Tricuspid Valve: The tricuspid valve is grossly normal. Tricuspid valve regurgitation is mild. Aortic Valve: The aortic valve is tricuspid. Aortic valve regurgitation is not visualized. Pulmonic Valve: The pulmonic valve was grossly normal. Pulmonic valve regurgitation is not visualized. Aorta: The aortic root is normal in size and structure. Venous: The inferior vena cava is normal in size with greater than 50% respiratory variability, suggesting right atrial pressure of 3 mmHg. IAS/Shunts: No atrial level shunt detected by color flow Doppler.  LEFT VENTRICLE PLAX 2D LVIDd:         3.72 cm  Diastology LVIDs:         3.15 cm  LV e' medial:    6.53 cm/s LV PW:         1.00 cm  LV E/e' medial:  9.7 LV IVS:        0.80 cm  LV e' lateral:   6.42 cm/s LVOT diam:     1.80 cm  LV E/e' lateral: 9.9 LVOT Area:     2.54 cm  RIGHT VENTRICLE RV S prime:     7.29 cm/s LEFT ATRIUM             RIGHT ATRIUM LA diam:        2.20 cm RA Area:     9.12 cm LA Vol (A2C):   24.3 ml RA Volume:   20.90 ml LA Vol (A4C):   28.3 ml  LA Biplane Vol: 27.0 ml   AORTA Ao Root diam: 2.40 cm MITRAL VALVE               TRICUSPID VALVE MV Area (PHT): 4.21 cm    TR Peak grad:   19.0 mmHg MV Decel Time: 180 msec    TR Vmax:        218.00 cm/s MV E velocity: 63.40 cm/s MV A velocity: 70.70 cm/s  SHUNTS MV E/A ratio:  0.90        Systemic Diam: 1.80 cm Rozann Lesches MD Electronically signed by Rozann Lesches  MD Signature Date/Time: 12/03/2020/4:11:56 PM    Final    [place] EKG SITE RITE  Result Date: 12/04/2020 If Site Rite image not attached, placement could not be confirmed due to current cardiac rhythm.  US Abdomen Limited RUQ (LIVER/GB)  Result Date: 11/30/2020 CLINICAL DATA:  Right upper quadrant pain and elevated LFTs EXAM: ULTRASOUND ABDOMEN LIMITED RIGHT UPPER QUADRANT COMPARISON:  None. FINDINGS: Gallbladder: Gallbladder is well distended. Mild gallbladder sludge is noted. No gallstones are seen. Common bile duct: Diameter: 3.2 mm. Liver: Mild increased echogenicity consistent with fatty infiltration. No mass lesion is noted. Portal vein is patent on color Doppler imaging with normal direction of blood flow towards the liver. Other: None. IMPRESSION: Fatty infiltration of the liver. Gallbladder sludge.  No cholelithiasis or wall thickening is noted. Electronically Signed   By: Inez Catalina M.D.   On: 11/30/2020 12:50   US THORACENTESIS ASP PLEURAL SPACE W/IMG GUIDE  Result Date: 12/01/2020 INDICATION: COVID-19 pneumonia, sepsis, small RIGHT pleural effusion EXAM: ULTRASOUND GUIDED DIAGNOSTIC RIGHT THORACENTESIS MEDICATIONS: None. COMPLICATIONS: None immediate. PROCEDURE: An ultrasound guided thoracentesis was thoroughly discussed with the patient and questions answered. The benefits, risks, alternatives and complications were also discussed. The patient understands and wishes to proceed with the procedure. Written consent was obtained. Ultrasound was performed to localize and mark an adequate pocket of fluid in the RIGHT chest. The area  was then prepped and draped in the normal sterile fashion. 1% Lidocaine was used for local anesthesia. Under ultrasound guidance a a syringe with a 22 gauge needle was advanced into the small RIGHT pleural effusion. 16 cc of fluid was performed. The catheter was removed and a dressing applied. FINDINGS: A total of approximately 16 mL of slightly cloudy yellow fluid was removed. Sample was sent to the laboratory as requested by the clinical team. IMPRESSION: Successful ultrasound guided right thoracentesis yielding 16 mL of pleural fluid. Electronically Signed   By: Lavonia Dana M.D.   On: 12/01/2020 14:24     CBC Recent Labs  Lab 11/30/20 0856 11/30/20 1340 12/01/20 0700 12/02/20 0510 12/03/20 0459 12/04/20 0409  WBC 5.3 6.3 8.7 7.2 5.8 13.3*  HGB 12.2 11.6* 10.2* 9.9* 10.5* 10.6*  HCT 39.9 34.7* 29.4* 28.5* 30.9* 31.9*  PLT 273 195 167 126* 113* 108*  MCV 96.4 88.7 85.5 84.8 86.3 87.6  MCH 29.5 29.7 29.7 29.5 29.3 29.1  MCHC 30.6 33.4 34.7 34.7 34.0 33.2  RDW 16.6* 15.5 15.3 16.0* 16.8* 16.8*  LYMPHSABS 0.4*  --  0.9 0.8 1.4 2.1  MONOABS 0.1  --  0.2 0.3 0.3 0.4  EOSABS 0.0  --  0.0 0.0 0.0 0.0  BASOSABS 0.1  --  0.0 0.1 0.0 0.1    Chemistries  Recent Labs  Lab 11/30/20 0856 11/30/20 1340 11/30/20 2125 12/01/20 0700 12/01/20 0900 12/02/20 0510 12/03/20 0459 12/04/20 0409  NA 123*   < > 136  --  137 139 141 143  K 3.1*   < > 3.7  --  3.8 4.6 5.2* 4.3  CL 82*   < > 107  --  107 107 109 111  CO2 11*   < > 20*  --  _0 GLUCOSE 1,056*   < > 268*  --  192* 231* 259* 143*  BUN 22*   < > 16  --  21* 30* 36* 27*  CREATININE 1.15*   < > 0.68  --  0.67 0.76 0.67 0.51  CALCIUM 7.0*   < >  6.6*  --  6.6* 6.9* 7.1* 7.2*  MG 2.9*  --   --  2.3  --  2.1 2.2 2.0  AST 246*  --   --   --  101* 62* 303* 57*  ALT 299*  --   --   --  173* 129* 181* 118*  ALKPHOS 748*  --   --   --  438* 459* 544* 399*  BILITOT 1.7*  --   --   --  0.4 0.6 0.6 0.7   < > = values in this interval not  displayed.   ------------------------------------------------------------------------------------------------------------------ No results for input(s): CHOL, HDL, LDLCALC, TRIG, CHOLHDL, LDLDIRECT in the last 72 hours.  Lab Results  Component Value Date   HGBA1C 14.7 (H) 11/30/2020   ------------------------------------------------------------------------------------------------------------------ No results for input(s): TSH, T4TOTAL, T3FREE, THYROIDAB in the last 72 hours.  Invalid input(s): FREET3 ------------------------------------------------------------------------------------------------------------------ Recent Labs    12/03/20 0459 12/04/20 0409  FERRITIN 466* 389*    Coagulation profile No results for input(s): INR, PROTIME in the last 168 hours.  Recent Labs    12/03/20 0459 12/04/20 0409  DDIMER 1.98* 3.29*    Cardiac Enzymes No results for input(s): CKMB, TROPONINI, MYOGLOBIN in the last 168 hours.  Invalid input(s): CK ------------------------------------------------------------------------------------------------------------------    Component Value Date/Time   BNP 352.8 (H) 03/08/2019 1847   Roxan Hockey M.D on 12/04/2020 at 11:49 AM  Go to [email or web address] - for contact info  [facility] - Office  [phone]

## 2020-12-04 NOTE — Progress Notes (Signed)
RT removed BIPAP and placed on 10L HFNC, patient SATs98%, HR 60 and RR 19. RT will continue to monitor

## 2020-12-04 NOTE — Progress Notes (Signed)
Peripherally Inserted Central Catheter Placement  The IV Nurse has discussed with the patient and/or persons authorized to consent for the patient, the purpose of this procedure and the potential benefits and risks involved with this procedure.  The benefits include less needle sticks, lab draws from the catheter, and the patient may be discharged home with the catheter. Risks include, but not limited to, infection, bleeding, blood clot (thrombus formation), and puncture of an artery; nerve damage and irregular heartbeat and possibility to perform a PICC exchange if needed/ordered by physician.  Alternatives to this procedure were also discussed.  Bard Power PICC patient education guide, fact sheet on infection prevention and patient information card has been provided to patient /or left at bedside.    PICC Placement Documentation  PICC Double Lumen 12/04/20 PICC Right Brachial 38 cm 1 cm (Active)  Indication for Insertion or Continuance of Line Limited venous access - need for IV therapy >5 days (PICC only);Vasoactive infusions 12/04/20 1826  Exposed Catheter (cm) 1 cm 12/04/20 1826  Site Assessment Clean;Dry;Intact 12/04/20 1826  Lumen #1 Status Blood return noted;Flushed;Saline locked 12/04/20 1826  Lumen #2 Status Blood return noted;Flushed;Saline locked 12/04/20 1826  Dressing Type Transparent 12/04/20 1826  Dressing Status Clean;Dry;Intact 12/04/20 1826  Antimicrobial disc in place? Yes 12/04/20 1826  Safety Lock Not Applicable 12/04/20 1826  Line Care Connections checked and tightened 12/04/20 1826  Dressing Change Due 12/11/20 12/04/20 1826       Marisa Sprinkles 12/04/2020, 6:29 PM

## 2020-12-04 NOTE — Progress Notes (Signed)
Inpatient Diabetes Program Recommendations  AACE/ADA: New Consensus Statement on Inpatient Glycemic Control (2015)  Target Ranges:  Prepandial:   less than 140 mg/dL      Peak postprandial:   less than 180 mg/dL (1-2 hours)      Critically ill patients:  140 - 180 mg/dL   Lab Results  Component Value Date   GLUCAP 124 (H) 12/04/2020   HGBA1C 14.7 (H) 11/30/2020    Review of Glycemic Control Results for [name], [name] (MRN [id number]) as of 12/04/2020 10:55  Ref. Range 12/03/2020 07:37 12/03/2020 12:06 12/03/2020 16:46 12/03/2020 20:20 12/04/2020 03:52 12/04/2020 04:42 12/04/2020 05:35 12/04/2020 09:00  Glucose-Capillary Latest Ref Range: 70 - 99 mg/dL 229 (H) 798 (H) 921 (H) 97 35 (LL) 59 (L) 86 124 (H)   Diabetes history: DM type 1 Outpatient Diabetes medications: Lantus 35 units daily, Novolog 1-15 units tid Current orders for Inpatient glycemic control:  Lantus 10 units Novolog 0-15 units tid + hs  Solumedrol 40 mg Daily  Inpatient Diabetes Program Recommendations:    -  Reduce Novolog Correction to 'sensitive' 0-9 units tid + hs  Thanks,  Christena Deem RN, MSN, BC-ADM Inpatient Diabetes Coordinator Team Pager [phone] (8a-5p)

## 2020-12-04 NOTE — Progress Notes (Signed)
Throughout night patient has become severely agitated as soon as PRN medications have worn off. Patient continues to thrash around in bed and attempt to kick her legs and pull at her wrist restraints. Patient has managed to get her hand around her ETT and her IJ tube multiple times throughout night but staff has been able to keep her from removing them. Charge RN in to assist this RN to calm patient and keep her from removing vital medical equipment throughout night.

## 2020-12-04 NOTE — Plan of Care (Signed)
  Problem: Education: Goal: Knowledge of risk factors and measures for prevention of condition will improve Outcome: Not Progressing   Problem: Coping: Goal: Psychosocial and spiritual needs will be supported Outcome: Not Progressing   Problem: Respiratory: Goal: Will maintain a patent airway Outcome: Not Progressing Goal: Complications related to the disease process, condition or treatment will be avoided or minimized Outcome: Not Progressing   Problem: Education: Goal: Ability to describe self-care measures that may prevent or decrease complications (Diabetes Survival Skills Education) will improve Outcome: Not Progressing Goal: Individualized Educational Video(s) Outcome: Not Progressing   Problem: Cardiac: Goal: Ability to maintain an adequate cardiac output will improve Outcome: Not Progressing   Problem: Health Behavior/Discharge Planning: Goal: Ability to identify and utilize available resources and services will improve Outcome: Not Progressing Goal: Ability to manage health-related needs will improve Outcome: Not Progressing   Problem: Fluid Volume: Goal: Ability to achieve a balanced intake and output will improve Outcome: Not Progressing   Problem: Metabolic: Goal: Ability to maintain appropriate glucose levels will improve Outcome: Not Progressing   Problem: Nutritional: Goal: Maintenance of adequate nutrition will improve Outcome: Not Progressing Goal: Maintenance of adequate weight for body size and type will improve Outcome: Not Progressing   Problem: Respiratory: Goal: Will regain and/or maintain adequate ventilation Outcome: Not Progressing   Problem: Urinary Elimination: Goal: Ability to achieve and maintain adequate renal perfusion and functioning will improve Outcome: Not Progressing   Problem: Safety: Goal: Non-violent Restraint(s) Outcome: Not Progressing   Problem: Education: Goal: Knowledge of General Education information will  improve Description: Including pain rating scale, medication(s)/side effects and non-pharmacologic comfort measures Outcome: Not Progressing   Problem: Health Behavior/Discharge Planning: Goal: Ability to manage health-related needs will improve Outcome: Not Progressing   Problem: Clinical Measurements: Goal: Ability to maintain clinical measurements within normal limits will improve Outcome: Not Progressing Goal: Will remain free from infection Outcome: Not Progressing Goal: Diagnostic test results will improve Outcome: Not Progressing Goal: Respiratory complications will improve Outcome: Not Progressing Goal: Cardiovascular complication will be avoided Outcome: Not Progressing   Problem: Activity: Goal: Risk for activity intolerance will decrease Outcome: Not Progressing   Problem: Nutrition: Goal: Adequate nutrition will be maintained Outcome: Not Progressing   Problem: Coping: Goal: Level of anxiety will decrease Outcome: Not Progressing   Problem: Elimination: Goal: Will not experience complications related to bowel motility Outcome: Not Progressing Goal: Will not experience complications related to urinary retention Outcome: Not Progressing   Problem: Pain Managment: Goal: General experience of comfort will improve Outcome: Not Progressing   Problem: Safety: Goal: Ability to remain free from injury will improve Outcome: Not Progressing   Problem: Skin Integrity: Goal: Risk for impaired skin integrity will decrease Outcome: Not Progressing

## 2020-12-04 NOTE — Progress Notes (Signed)
RN to go in with RRT for vent checks as patient becomes very agitated and throws herself around in bed. Vitals also become unstable with every episode. PRN medications provided, precedex gtt at max rate. Attempting to keep patient calm without over sedating her as ETT is to be removed in AM.

## 2020-12-04 NOTE — Progress Notes (Signed)
Dextrose given for CBG level 2 times will recheck on or about 0530.

## 2020-12-04 NOTE — Progress Notes (Signed)
NAME:  [name], MRN:  [id number], DOB:  1986-04-10, LOS: 4 ADMISSION DATE:  11/30/2020, CONSULTATION DATE:  12/04/2020  REFERRING MD:  Gwendolyn Lima, CHIEF COMPLAINT: Respiratory failure requiring mechanical ventilation  Brief History:  35 year old with history of IDDM and cocaine use, admitted with pneumonia and due to Covid infection and DKA, worsening hypoxia on 2/14 requiring mechanical ventilation  History of Present Illness:  History is obtained from chart review since patient is currently intubated and sedated. She has a history of active cocaine use and prior episodes of DKA.  She is reported to be vaccinated for Covid but not boosted. ED visit on 2/12 for nausea and vomiting, chest x-ray showed bilateral bronchopneumonia CT abdomen showed masslike consolidation right upper and lower lobe with trace right effusion, but no intra-abdominal process.  She was discharged with cefdinir and azithromycin. She returned on 2/14 via EMS with hypoxia, saturation 66% on room air and CBGs more than 600.  She required nonrebreather in the ED, Covid PCR was positive, initial labs showed lactate of 6.8 and DKA with CBG greater than 1000.  LFTs were elevated.  Prolonged QT was noted on EKG. she admitted to cocaine use 3 days prior to admission, surprisingly urine toxicology was negative She was aggressively resuscitated with fluids and IV insulin, started on remdesivir and Solu-Medrol and admitted to the ICU right upper quadrant ultrasound did not show any active process She transitioned from nonrebreather to heated high flow oxygen to BiPAP and was eventually intubated on 2/14 for hypoxic/hypercarbic respiratory failure    Past Medical History:  IDDM GERD Cocaine use  Significant Hospital Events:  2/14 intubated  Consults:    Procedures:  ETT 2/14 >> 2/17 RIJ 2/15 >> [place] thora 2/15 >> 16cc "cloudy" fluid removed  Significant Diagnostic Tests:  CT abdomen 2/12 masslike consolidation  right upper and lower lobe with trace right effusion, stenosis of celiac artery similar to that noted in 2018  RUQ [place] 2/14 >> no CBD dilatation, fatty liver  CT chest without contrast 2/17 bilateral small effusions + ascites , Underlying emphysematous changes with superimposed severe interstitial process in the upper lobes with areas of cystic change which could be bronchiectasis or pneumatoceles.  Dense airspace consolidation in both lower lobes with suspected areas of mild cavitation most notably in the right lower lobe medially.   Echo 2/17 >> EF 35%, no veg Micro Data:  [facility] 2/14 >>ng Pleural fluid 2/15 >> MRSA MRSA PCR POS resp 2/15 >> Urine strep antigen negative  Antimicrobials:  Cefepime 2/14 >> vanc 2/15 >>   Interim History / Subjective:   Agitated overnight, in spite of Precedex, given fentanyl this morning and given overnight. Placed on BiPAP for unclear reason Afebrile   Objective   Blood pressure (!) 140/95, pulse (!) 59, temperature 97.6 F (36.4 C), temperature source Axillary, resp. rate 18, height 5' (1.524 m), weight 55.5 kg, last menstrual period 11/25/2020, SpO2 98 %.    Vent Mode: BIPAP FiO2 (%):  [40 %-50 %] 50 % Set Rate:  [15 bmp-19 bmp] 15 bmp PEEP:  [5 cmH20] 5 cmH20 Pressure Support:  [10 cmH20] 10 cmH20   Intake/Output Summary (Last 24 hours) at 12/04/2020 1023 Last data filed at 12/04/2020 0540 Gross per 24 hour  Intake 1688.88 ml  Output 1450 ml  Net 238.88 ml   Filed Weights   11/30/20 0846 12/02/20 0346  Weight: 49 kg 55.5 kg    Examination: General: Young woman, on BiPAP no distress HENT:  Mild pallor, no icterus, no JVD Lungs: No accessory muscle use, no rhonchi Cardiovascular: S1-S2 regular, no murmur Abdomen: Soft, nontender, no organomegaly Extremities: No deformity, no track marks on skin Neuro: Calm, restrained, follows one-step commands GU: Clear urine  Chest x-ray independently reviewed from 2/17 bilateral small  effusions, minimal layering  Labs show normal electrolytes, mild leukocytosis and thrombocytopenia  Resolved Hospital Problem list     Assessment & Plan:  Her presentation seems to be more compatible with multilobar bronchopneumonia with a right parapneumonic effusion rather than covid. Differential is aspiration related to vomiting which may have been induced by cocaine use and known celiac artery stenosis - MRSA in pleural fluid and appearance of pneumatoceles and cavitary pneumonia on chest x-ray raises the question of staph pneumonia?  Disseminated staph -No vegetations on echo   Acute hypoxic/hypercarbic respiratory failure Mild ARDS range hypoxia -improved -She has tolerated extubation, can discontinue BiPAP and use nasal cannula as needed, work of breathing is low  Pneumatoceles with cavitary pneumonia- Right empyema/MRSA  -Obtained T CTS opinion, effusions are too small right now to drain, watch chest x-ray every 2 to 3 days, if right effusion increases will need pigtail chest tube -Otherwise would suggest vancomycin or linezolid for at least 4 weeks, would obtain ID opinion   COVID-19 positive test (U07.1, COVID-19) with Acute Pneumonia (J12.89, Other viral pneumonia) -Remdesivir  per [facility] -Drop steroids rapidly since I do not think pneumonia is due to Covid -Not a candidate for Actemra due to ongoing infection   DKA -beta hydroxybutyrate and anion gap resolved, transitioned to subcu insulin   Elevated LFTs -unclear cause , ultrasound showed fatty liver -Trend  Acute metabolic encephalopathy -due to hypoxia and hypercarbia, admitted to cocaine use 3 days PTA although UDS was negative -Using Precedex, goal RASS zero -Okay to use low-dose Ativan since she is a chronic benzo user Avoid oversedation -Once mental status better, can start PT  Best practice (evaluated daily)  Diet: NPO Pain/Anxiety/Delirium protocol (if indicated): Precedex VAP protocol (if  indicated): Y DVT prophylaxis: Lovenox GI prophylaxis: Protonix Glucose control: Insulin SQ Mobility: Bedrest Disposition: ICU  Goals of Care:   Code Status: Full code  Labs   CBC: Recent Labs  Lab 11/30/20 0856 11/30/20 1340 12/01/20 0700 12/02/20 0510 12/03/20 0459 12/04/20 0409  WBC 5.3 6.3 8.7 7.2 5.8 13.3*  NEUTROABS 4.6  --  7.3 6.0 4.1 10.7*  HGB 12.2 11.6* 10.2* 9.9* 10.5* 10.6*  HCT 39.9 34.7* 29.4* 28.5* 30.9* 31.9*  MCV 96.4 88.7 85.5 84.8 86.3 87.6  PLT 273 195 167 126* 113* 108*    Basic Metabolic Panel: Recent Labs  Lab 11/30/20 0856 11/30/20 1340 11/30/20 2125 12/01/20 0700 12/01/20 0900 12/02/20 0510 12/03/20 0459 12/04/20 0409  NA 123*   < > 136  --  137 139 141 143  K 3.1*   < > 3.7  --  3.8 4.6 5.2* 4.3  CL 82*   < > 107  --  107 107 109 111  CO2 11*   < > 20*  --  24 26 27 29   GLUCOSE 1,056*   < > 268*  --  192* 231* 259* 143*  BUN 22*   < > 16  --  21* 30* 36* 27*  CREATININE 1.15*   < > 0.68  --  0.67 0.76 0.67 0.51  CALCIUM 7.0*   < > 6.6*  --  6.6* 6.9* 7.1* 7.2*  MG 2.9*  --   --  2.3  --  2.1 2.2 2.0   < > = values in this interval not displayed.   GFR: Estimated Creatinine Clearance: 76 mL/min (by C-G formula based on SCr of 0.51 mg/dL). Recent Labs  Lab 11/30/20 0856 11/30/20 1129 11/30/20 1340 11/30/20 1809 11/30/20 2125 12/01/20 0700 12/02/20 0510 12/03/20 0459 12/04/20 0409  PROCALCITON 32.32  --   --   --   --  15.87  --   --   --   WBC 5.3  --    < >  --   --  8.7 7.2 5.8 13.3*  LATICACIDVEN 6.8* 3.5*  --  2.1* 2.7*  --   --   --   --    < > = values in this interval not displayed.    Liver Function Tests: Recent Labs  Lab 11/30/20 0856 12/01/20 0900 12/02/20 0510 12/03/20 0459 12/04/20 0409  AST 246* 101* 62* 303* 57*  ALT 299* 173* 129* 181* 118*  ALKPHOS 748* 438* 459* 544* 399*  BILITOT 1.7* 0.4 0.6 0.6 0.7  PROT 6.2* 4.8* 4.8* 4.9* 4.6*  ALBUMIN 2.6* 1.6* 1.5* 1.6* 1.5*   Recent Labs  Lab  11/27/20 1958  LIPASE 18   No results for input(s): AMMONIA in the last 168 hours.    My independent critical care time x 43m  Cyril Mourning MD. FCCP. [facility] Pager : [phone]  If no response to pager , please call [phone] until 7 pm After 7:00 pm call Elink  [phone]   12/04/2020

## 2020-12-05 ENCOUNTER — Inpatient Hospital Stay (HOSPITAL_COMMUNITY): Payer: Medicaid Other

## 2020-12-05 DIAGNOSIS — F1410 Cocaine abuse, uncomplicated: Secondary | ICD-10-CM | POA: Diagnosis not present

## 2020-12-05 DIAGNOSIS — U071 COVID-19: Secondary | ICD-10-CM | POA: Diagnosis not present

## 2020-12-05 DIAGNOSIS — A419 Sepsis, unspecified organism: Secondary | ICD-10-CM | POA: Diagnosis not present

## 2020-12-05 DIAGNOSIS — F1310 Sedative, hypnotic or anxiolytic abuse, uncomplicated: Secondary | ICD-10-CM

## 2020-12-05 LAB — CULTURE, BLOOD (ROUTINE X 2)
Culture: NO GROWTH
Culture: NO GROWTH
Special Requests: ADEQUATE

## 2020-12-05 LAB — COMPREHENSIVE METABOLIC PANEL
ALT: 104 U/L — ABNORMAL HIGH (ref 0–44)
AST: 53 U/L — ABNORMAL HIGH (ref 15–41)
Albumin: 1.6 g/dL — ABNORMAL LOW (ref 3.5–5.0)
Alkaline Phosphatase: 332 U/L — ABNORMAL HIGH (ref 38–126)
Anion gap: 3 — ABNORMAL LOW (ref 5–15)
BUN: 21 mg/dL — ABNORMAL HIGH (ref 6–20)
CO2: 30 mmol/L (ref 22–32)
Calcium: 7.1 mg/dL — ABNORMAL LOW (ref 8.9–10.3)
Chloride: 108 mmol/L (ref 98–111)
Creatinine, Ser: 0.32 mg/dL — ABNORMAL LOW (ref 0.44–1.00)
GFR, Estimated: >60 mL/min (ref >60)
Glucose, Bld: 164 mg/dL — ABNORMAL HIGH (ref 70–99)
Potassium: 3.9 mmol/L (ref 3.5–5.1)
Sodium: 141 mmol/L (ref 135–145)
Total Bilirubin: 0.7 mg/dL (ref 0.3–1.2)
Total Protein: 4.6 g/dL — ABNORMAL LOW (ref 6.5–8.1)

## 2020-12-05 LAB — CBC WITH DIFFERENTIAL/PLATELET
Abs Immature Granulocytes: 0.08 10*3/uL — ABNORMAL HIGH (ref 0.00–0.07)
Basophils Absolute: 0 10*3/uL (ref 0.0–0.1)
Basophils Relative: 0 %
Eosinophils Absolute: 0 10*3/uL (ref 0.0–0.5)
Eosinophils Relative: 0 %
HCT: 32.6 % — ABNORMAL LOW (ref 36.0–46.0)
Hemoglobin: 10.9 g/dL — ABNORMAL LOW (ref 12.0–15.0)
Immature Granulocytes: 1 %
Lymphocytes Relative: 7 %
Lymphs Abs: 1 10*3/uL (ref 0.7–4.0)
MCH: 28.8 pg (ref 26.0–34.0)
MCHC: 33.4 g/dL (ref 30.0–36.0)
MCV: 86.2 fL (ref 80.0–100.0)
Monocytes Absolute: 0.5 10*3/uL (ref 0.1–1.0)
Monocytes Relative: 4 %
Neutro Abs: 11.3 10*3/uL — ABNORMAL HIGH (ref 1.7–7.7)
Neutrophils Relative %: 88 %
Platelets: 99 10*3/uL — ABNORMAL LOW (ref 150–400)
RBC: 3.78 MIL/uL — ABNORMAL LOW (ref 3.87–5.11)
RDW: 16.4 % — ABNORMAL HIGH (ref 11.5–15.5)
WBC: 12.9 10*3/uL — ABNORMAL HIGH (ref 4.0–10.5)
nRBC: 0 % (ref 0.0–0.2)

## 2020-12-05 LAB — FERRITIN: Ferritin: 367 ng/mL — ABNORMAL HIGH (ref 11–307)

## 2020-12-05 LAB — D-DIMER, QUANTITATIVE: D-Dimer, Quant: 3.12 ug/mL-FEU — ABNORMAL HIGH (ref 0.00–0.50)

## 2020-12-05 LAB — GLUCOSE, CAPILLARY
Glucose-Capillary: 144 mg/dL — ABNORMAL HIGH (ref 70–99)
Glucose-Capillary: 79 mg/dL (ref 70–99)
Glucose-Capillary: 122 mg/dL — ABNORMAL HIGH (ref 70–99)
Glucose-Capillary: 119 mg/dL — ABNORMAL HIGH (ref 70–99)

## 2020-12-05 LAB — MAGNESIUM: Magnesium: 1.7 mg/dL (ref 1.7–2.4)

## 2020-12-05 LAB — C-REACTIVE PROTEIN: CRP: 2.4 mg/dL — ABNORMAL HIGH (ref <1.0)

## 2020-12-05 NOTE — Progress Notes (Signed)
[facility] night shift.  The staff requested renewal of wrist restraints.    She was bradycardic earlier and the Precedex infusion was stopped. Lungs with scattered rhonchi. Cardiovascular S1-S2.  Abdomen soft nontender.  Extremities showed no edema.  Wrist restraints have been renewed for patient's safety.  Sanda Klein, MD.

## 2020-12-05 NOTE — Progress Notes (Signed)
Patient Demographics:    [name], is a 35 y.o. female, DOB - [birth year], JJH:[id number]  Admit date - 11/30/2020   Admitting Physician Pratik Darleen Crocker, DO  Outpatient Primary MD for the patient is [facility] [place] [facility]   Chief Complaint  Patient presents with  . Weakness       . hypoxia        Subjective:    [name] remains on Precedex infusion, is in soft wrist restraints, remains lethargic/agitated -   Assessment  & Plan :    Principal Problem:   Severe MRSA sepsis with septic shock -secondary to community-acquired pneumonia with parapneumonic effusion/Empyema Active Problems:   Substance induced mood disorder (HCC)   Benzodiazepine abuse ([place])   Substance abuse ([place])   Tobacco abuse   Cocaine abuse ([place])   DKA, type 1 ([place])   Pneumonia due to COVID-19 virus   Acute Hypoxic respiratory failure due to COVID-19 ([place])   Sepsis secondary to community-acquired pneumonia with parapneumonic effusion   CAP (community acquired pneumonia)--- with parapneumonic effusion/MRSA Empyema   Systolic CHF, acute ----EF 30 to 35 %   Rt Sided MRSA Empyema of pleura ([place])   Brief Summary:- 35 y.o. female with medical history significant for type 1 diabetes, GERD, and cocaine use admitted on 12/01/2020 with pathophysiology consistent with DKA-as well as AKI and acute hypoxic respiratory failure secondary to Covid pneumonia with transaminitis and electrolyte abnormalities, and found to have severe sepsis with shock secondary to bacterial pneumonia superimposed on Covid pneumonia, pleural fluid culture from 12/01/20 showing staph -Initially intubated 11/30/20,  extubated 12/03/2020  -- A/p 1)Acute hypoxic respiratory failure secondary to COVID-19 infection/Pneumonia--- The treatment plan and use of medications  for treatment of COVID-19 infection and possible side effects were  discussed with pts Husband -----[name] verbalizes understanding and agrees to treatment protocols   --Patient is positive for COVID-19 infection, chest x-ray with findings of infiltrates/opacities,  patient is tachypneic/hypoxic and requiring intubation and sedation  ---patient meets criteria for initiation of Remdesivir AND Steroid therapy per protocol  --Check and trend inflammatory markers including D-dimer, ferritin and  CRP---also follow CBC and CMP -Follow serial chest x-rays and ABGs as indicated --Attempt to maintain euvolemic state --Zinc and vitamin C as ordered -Albuterol inhaler as needed -Accu-Cheks/fingersticks while on high-dose steroids -PPI while on high-dose steroids -Currently not a candidate for Actemra due to concerns about acute superimposed bacterial infection -Extubated 12/03/2020 currently on nasal cannula at 15 L/min, wean down to 10 L today  COVID-19 Labs  Recent Labs    12/03/20 0459 12/04/20 0409 12/05/20 0416 12/05/20 0417  DDIMER 1.98* 3.29* 3.12*  --   FERRITIN 466* 389*  --  367*  CRP 3.4* 2.2*  --  2.4*    Lab Results  Component Value Date   SARSCOV2NAA POSITIVE (A) 11/30/2020   [place] NEGATIVE 04/06/2020   [place] NEGATIVE 03/08/2019   [place] NOT DETECTED 02/28/2019   2)Severe staphylococcal/MRSA sepsis with septic shock secondary to community-acquired pneumonia with parapneumonic effusion/Empyema--- -- POA--patient met sepsis criteria on admission with fevers, tachycardia, tachypnea, as well as profound hypoxia and hypotension as well as lactic acidosis -Status post diagnostic right-sided ultrasound thoracentesis on 12/01/2020 -Continue IV cefepime and  vancomycin pending blood cultures and pleural fluid studies (exudate)-- - pleural fluid culture from 12/01/20 showing   MRSA   -Leukocytosis has resolved -Weaned off  IV Levophed  -PCCM consult from Dr. Kara Mead appreciated -Repeat CT chest without contrast done on  12/03/2020-findings were reviewed with CT surgery by Dr. Elsworth Soho  -CT surgery recommends antibiotic therapy rather than operative intervention -Discussed with ID pharmacist who recommends :-stop the cefepime and continue with vanc for 2 weeks, would also recommend adding flagyl, with these empyemas there is often anaerobes in there that we dont always isolate. After 2 weeks of vanc we could move to linezolid/flagyl as an option, her platelets arent the greatest right now but I suspect as her infection clears they will rebound to what they were on admission. Alternatively we could do something like doxycycline/flagyl but linezolid has a bit better oral bioavailability  -TTE without endocarditis, given empyema patient needs long-term antibiotic therapy, given polysubstance abuse patient may not be compliant   3)DKA-DKA pathophysiology appears to have resolved, -Off IV insulin drip, continue subcu insulin and IV fluids  4)-Prophylaxis-Protonix for GI prophylaxis Lovenox for DVT prophylaxis  5) elevated LFTs--- liver ultrasound with fatty liver otherwise no acute findings -12/03/20--LFTs were initially trending up secondary to ischemic liver injury in the setting of hypotension, now trending down  Hepatic Function Latest Ref Rng & Units 12/05/2020 12/04/2020 12/03/2020  Total Protein 6.5 - 8.1 g/dL 4.6(L) 4.6(L) 4.9(L)  Albumin 3.5 - 5.0 g/dL 1.6(L) 1.5(L) 1.6(L)  AST 15 - 41 U/L 53(H) 57(H) 303(H)  ALT 0 - 44 U/L 104(H) 118(H) 181(H)  Alk Phosphatase 38 - 126 U/L 332(H) 399(H) 544(H)  Total Bilirubin 0.3 - 1.2 mg/dL 0.7 0.7 0.6  Bilirubin, Direct 0.0 - 0.2 mg/dL - - -    6) hyponatremia/hyperkalemia--improved with replacement  7)COPD/tobacco abuse--- bronchodilators-ordered  -patient currently intubated and sedated  8) polysubstance abuse--- patient admitted to using cocaine 3 days PTA - UDS at the time of admission was already negative -c/n gabapentin and methocarbamol to help blunt withdrawal  symptoms  9)Social/Ethics--patient has a husband and 2 kids, husband is primary contact and primary decision maker- -patient's husband will keep on it for members updated on patient's condition and other questions -Patient remains a full code  10)Acute anemia and Acute thrombocytopenia--- in the setting of sepsis and hemodilution--- continue to monitor closely, no evidence of ongoing bleeding at this time  11) metabolic encephalopathy.  Suspect related to hypoxia as well as underlying infectious process  -Requiring Precedex infusion and intermittent dose of Ativan -Since patient is significantly bradycardic and somnolent at this time, will discontinue further Precedex and monitor mental status -Continue to use Ativan on a as needed basis   Disposition/Need for in-Hospital Stay- patient unable to be discharged at this time due to  -Severe staphylococcal sepsis with septic shock secondary to community-acquired pneumonia with parapneumonic effusion/Empyema-and DKA as well as Covid pneumonia requiring IV antibiotics, IV fluids,   Status is: Inpatient  Remains inpatient appropriate because:Please see above -Please see above  Disposition: The patient is from: Home              Anticipated d/c is to: Home              Anticipated d/c date is: > 3 days              Patient currently is not medically stable to d/c. Barriers: Not Clinically Stable-   Code Status :  -  Code Status: Full Code   Family Communication:   Patient spouse was updated 2/19  Consults  :  PCCM/IR/ID pharmacist  Procedures:- -Intubation on 11/30/2020 -Status post diagnostic right-sided ultrasound thoracentesis on 12/01/2020 -Right IJ central line placement on 12/01/2020, removed 12/04/2020 -Extubated 12/03/2020 -PICC line placed 12/04/2020  DVT Prophylaxis  :   - SCDs   enoxaparin (LOVENOX) injection 40 mg Start: 12/02/20 1800    Lab Results  Component Value Date   PLT 99 (L) 12/05/2020    Inpatient  Medications  Scheduled Meds: . Chlorhexidine Gluconate Cloth  6 each Topical Daily  . enoxaparin (LOVENOX) injection  40 mg Subcutaneous Q24H  . feeding supplement (GLUCERNA SHAKE)  237 mL Oral QID  . gabapentin  600 mg Oral TID  . insulin aspart  0-5 Units Subcutaneous QHS  . insulin aspart  0-9 Units Subcutaneous TID WC  . methocarbamol  750 mg Oral QID  . methylPREDNISolone (SOLU-MEDROL) injection  40 mg Intravenous Daily  . mupirocin ointment  1 application Nasal BID  . sodium chloride flush  10-40 mL Intracatheter Q12H   Continuous Infusions: . dexmedetomidine (PRECEDEX) IV infusion Stopped (12/05/20 1130)  . famotidine (PEPCID) IV 20 mg (12/05/20 0921)  . lactated ringers 50 mL/hr at 12/05/20 1821  . metronidazole 500 mg (12/05/20 1227)  . vancomycin 750 mg (12/05/20 1728)   PRN Meds:.acetaminophen (TYLENOL) oral liquid 160 mg/5 mL **OR** acetaminophen, dextrose, guaiFENesin-dextromethorphan, Ipratropium-Albuterol, LORazepam, sodium chloride flush    Anti-infectives (From admission, onward)   Start     Dose/Rate Route Frequency Ordered Stop   12/04/20 1515  vancomycin (VANCOREADY) IVPB 750 mg/150 mL       "Followed by" Linked Group Details   750 mg 150 mL/hr over 60 Minutes Intravenous Every 12 hours 12/04/20 1404     12/04/20 1230  metroNIDAZOLE (FLAGYL) IVPB 500 mg        500 mg 100 mL/hr over 60 Minutes Intravenous Every 8 hours 12/04/20 1133     12/02/20 0315  vancomycin (VANCOREADY) IVPB 500 mg/100 mL  Status:  Discontinued       "Followed by" Linked Group Details   500 mg 100 mL/hr over 60 Minutes Intravenous Every 12 hours 12/01/20 1423 12/04/20 1404   12/01/20 2000  ceFEPIme (MAXIPIME) 2 g in sodium chloride 0.9 % 100 mL IVPB  Status:  Discontinued        2 g 200 mL/hr over 30 Minutes Intravenous Every 8 hours 12/01/20 1423 12/04/20 1133   12/01/20 1500  vancomycin (VANCOCIN) IVPB 1000 mg/200 mL premix       "Followed by" Linked Group Details   1,000 mg 200  mL/hr over 60 Minutes Intravenous  Once 12/01/20 1423 12/01/20 1611   12/01/20 1000  remdesivir 100 mg in sodium chloride 0.9 % 100 mL IVPB       "Followed by" Linked Group Details   100 mg 200 mL/hr over 30 Minutes Intravenous Daily 11/30/20 1556 12/04/20 1103   11/30/20 1645  remdesivir 100 mg in sodium chloride 0.9 % 100 mL IVPB       "Followed by" Linked Group Details   100 mg 200 mL/hr over 30 Minutes Intravenous Every 30 min 11/30/20 1556 11/30/20 1756   11/30/20 1200  ceFEPIme (MAXIPIME) 2 g in sodium chloride 0.9 % 100 mL IVPB  Status:  Discontinued        2 g 200 mL/hr over 30 Minutes Intravenous Every 12 hours 11/30/20 1126 12/01/20 1423   11/30/20 0930  cefTRIAXone (ROCEPHIN) 1 g in sodium chloride 0.9 % 100 mL IVPB        1 g 200 mL/hr over 30 Minutes Intravenous  Once 11/30/20 0926 11/30/20 1016   11/30/20 0930  azithromycin (ZITHROMAX) 500 mg in sodium chloride 0.9 % 250 mL IVPB        500 mg 250 mL/hr over 60 Minutes Intravenous  Once 11/30/20 0926 11/30/20 1104        Objective:   Vitals:   12/05/20 1600 12/05/20 1700 12/05/20 1738 12/05/20 1800  BP: 100/79 103/77  (!) 151/93  Pulse: (!) 127 (!) 56    Resp: (!) 23 (!) 25  (!) 21  Temp:   (!) 97.3 F (36.3 C)   TempSrc:   Axillary   SpO2: (!) 88% (!) 63%    Weight:      Height:        Wt Readings from Last 3 Encounters:  12/02/20 55.5 kg  11/27/20 49.9 kg  04/07/20 47.6 kg     Intake/Output Summary (Last 24 hours) at 12/05/2020 1900 Last data filed at 12/05/2020 1821 Gross per 24 hour  Intake 2544.77 ml  Output 2150 ml  Net 394.77 ml    Physical Exam  General exam: Somnolent, agitated at times, and wrist restraints Respiratory system: Diminished breath sounds at bases. Respiratory effort normal. Cardiovascular system:RRR. No murmurs, rubs, gallops. Gastrointestinal system: Abdomen is nondistended, soft and nontender. No organomegaly or masses felt. Normal bowel sounds heard. Central nervous  system: No focal neurological deficits. Extremities: No C/C/E, +pedal pulses Skin: No rashes, lesions or ulcers Psychiatry: lethargic,agitated    Data Review:   Micro Results Recent Results (from the past 240 hour(s))  Blood Culture (routine x 2)     Status: None   Collection Time: 11/30/20  8:49 AM   Specimen: BLOOD LEFT ARM  Result Value Ref Range Status   Specimen Description BLOOD LEFT ARM DRAWN BY RN  Final   Special Requests   Final    BOTTLES DRAWN AEROBIC AND ANAEROBIC Blood Culture results may not be optimal due to an inadequate volume of blood received in culture bottles   Culture   Final    NO GROWTH 5 DAYS Performed at [facility], [address], [place], [place] [address]    Report Status 12/05/2020 FINAL  Final  Resp Panel by RT-PCR (Flu A&B, Covid) Nasopharyngeal Swab     Status: Abnormal   Collection Time: 11/30/20  8:56 AM   Specimen: Nasopharyngeal Swab; Nasopharyngeal(NP) swabs in vial transport medium  Result Value Ref Range Status   SARS Coronavirus 2 by RT PCR POSITIVE (A) NEGATIVE Final    Comment: RESULT CALLED TO, READ BACK BY AND VERIFIED WITH: CRAWFORD H. AT 1049AM ON [id number] BY THOMPSON S. (NOTE) SARS-CoV-2 target nucleic acids are DETECTED.  The SARS-CoV-2 RNA is generally detectable in upper respiratory specimens during the acute phase of infection. Positive results are indicative of the presence of the identified virus, but do not rule out bacterial infection or co-infection with other pathogens not detected by the test. Clinical correlation with patient history and other diagnostic information is necessary to determine patient infection status. The expected result is Negative.  Fact Sheet for Patients: EntrepreneurPulse.com.au  Fact Sheet for Healthcare Providers: IncredibleEmployment.be  This test is not yet approved or cleared by the [place] FDA and  has been authorized for detection and/or  diagnosis of SARS-CoV-2 by FDA under an Emergency Use Authorization (EUA).  This EUA  will remain in effect (meaning thi s test can be used) for the duration of  the COVID-19 declaration under Section 564(b)(1) of the Act, 21 U.S.C. section 360bbb-3(b)(1), unless the authorization is terminated or revoked sooner.     Influenza A by PCR NEGATIVE NEGATIVE Final   Influenza B by PCR NEGATIVE NEGATIVE Final    Comment: (NOTE) The Xpert Xpress SARS-CoV-2/FLU/RSV plus assay is intended as an aid in the diagnosis of influenza from Nasopharyngeal swab specimens and should not be used as a sole basis for treatment. Nasal washings and aspirates are unacceptable for Xpert Xpress SARS-CoV-2/FLU/RSV testing.  Fact Sheet for Patients: EntrepreneurPulse.com.au  Fact Sheet for Healthcare Providers: IncredibleEmployment.be  This test is not yet approved or cleared by the [place] FDA and has been authorized for detection and/or diagnosis of SARS-CoV-2 by FDA under an Emergency Use Authorization (EUA). This EUA will remain in effect (meaning this test can be used) for the duration of the COVID-19 declaration under Section 564(b)(1) of the Act, 21 U.S.C. section 360bbb-3(b)(1), unless the authorization is terminated or revoked.  Performed at [facility], [address], [place], [place] [address]   Blood Culture (routine x 2)     Status: None   Collection Time: 11/30/20  9:21 AM   Specimen: BLOOD LEFT FOREARM  Result Value Ref Range Status   Specimen Description BLOOD LEFT FOREARM  Final   Special Requests   Final    BOTTLES DRAWN AEROBIC AND ANAEROBIC Blood Culture adequate volume   Culture   Final    NO GROWTH 5 DAYS Performed at [facility], [address], [place], [place] [address]    Report Status 12/05/2020 FINAL  Final  MRSA PCR Screening     Status: Abnormal   Collection Time: 11/30/20 12:30 PM   Specimen: Nasopharyngeal  Result Value Ref  Range Status   MRSA by PCR POSITIVE (A) NEGATIVE Final    Comment:        The GeneXpert MRSA Assay (FDA approved for NASAL specimens only), is one component of a comprehensive MRSA colonization surveillance program. It is not intended to diagnose MRSA infection nor to guide or monitor treatment for MRSA infections. RESULT CALLED TO, READ BACK BY AND VERIFIED WITH: ASHLEY SHELTON 2/14 @ 8299 BY Bolivar Haw Performed at [facility], [address] [place], [place] [address]   Gram stain     Status: None   Collection Time: 12/01/20  2:02 PM   Specimen: Pleura  Result Value Ref Range Status   Specimen Description PLEURAL  Final   Special Requests NONE  Final   Gram Stain   Final    CYTOSPIN SMEAR NO ORGANISMS SEEN WBC PRESENT, PREDOMINANTLY PMN Performed at [facility], [address] [place], [place] [address]    Report Status 12/01/2020 FINAL  Final  Body fluid culture w Gram Stain     Status: None   Collection Time: 12/01/20  2:04 PM   Specimen: Pleura; Body Fluid  Result Value Ref Range Status   Specimen Description   Final    PLEURAL Performed at [facility], [address], [place], [place] [address]    Special Requests   Final    Normal Performed at [place] [place], [place] [address]    Gram Stain   Final    MODERATE WBC PRESENT, PREDOMINANTLY PMN NO ORGANISMS SEEN    Culture   Final    RARE METHICILLIN RESISTANT STAPHYLOCOCCUS AUREUS CRITICAL RESULT CALLED  TO, READ BACK BY AND VERIFIED WITH: RN A.LARIMORE AT 1000 ON 12/03/2020 BY T.SAAD Performed at [facility], [place] [address] [place], [place] [address]    Report Status 12/04/2020 FINAL  Final   Organism ID, Bacteria METHICILLIN RESISTANT STAPHYLOCOCCUS AUREUS  Final      Susceptibility   Methicillin resistant staphylococcus aureus - MIC*    CIPROFLOXACIN >=8 RESISTANT Resistant     ERYTHROMYCIN >=8 RESISTANT Resistant     GENTAMICIN <=0.5 SENSITIVE Sensitive     OXACILLIN >=4  RESISTANT Resistant     TETRACYCLINE <=1 SENSITIVE Sensitive     VANCOMYCIN 1 SENSITIVE Sensitive     TRIMETH/SULFA <=10 SENSITIVE Sensitive     CLINDAMYCIN <=0.25 SENSITIVE Sensitive     RIFAMPIN <=0.5 SENSITIVE Sensitive     Inducible Clindamycin NEGATIVE Sensitive     * RARE METHICILLIN RESISTANT STAPHYLOCOCCUS AUREUS   Radiology Reports CT CHEST WO CONTRAST  Result Date: 12/03/2020 CLINICAL DATA:  COVID pneumonia. EXAM: CT CHEST WITHOUT CONTRAST TECHNIQUE: Multidetector CT imaging of the chest was performed following the standard protocol without IV contrast. COMPARISON:  Chest CT 05/18/2017 FINDINGS: Cardiovascular: The heart is normal in size. No pericardial effusion. The endotracheal tube is in good position just above the carina. There is an NG tube coursing down the esophagus and into the stomach. The right IJ central venous catheter is in good position. Mediastinum/Nodes: Borderline enlarged mediastinal and hilar lymph nodes likely reactive/inflammatory. Lungs/Pleura: Underlying emphysematous changes are noted with superimposed severe interstitial process in the upper lobes with areas of cystic change which could be bronchiectasis or pneumatocele see. There is fairly dense airspace consolidation in both lower lobes with suspected areas of mild cavitation most notably in the right lower lobe medially. There are also small bilateral pleural effusions, right larger than left. No pneumothorax or pneumomediastinum. Upper Abdomen: Upper abdominal ascites is noted with fluid around the gallbladder, in the hepato renal fossa and pericolic gutters. Musculoskeletal: No significant bony findings. IMPRESSION: 1. Underlying emphysematous changes with superimposed severe interstitial process in the upper lobes with areas of cystic change which could be bronchiectasis or pneumatoceles. 2. Dense airspace consolidation in both lower lobes with suspected areas of mild cavitation most notably in the right lower  lobe medially. 3. Small bilateral pleural effusions, right larger than left. 4. Upper abdominal ascites. 5. Lines and tubes in good position without complicating features. Emphysema (ICD10-J43.9). Electronically Signed   By: Marijo Sanes M.D.   On: 12/03/2020 13:20   CT ABDOMEN PELVIS W CONTRAST  Result Date: 11/28/2020 CLINICAL DATA:  Abdominal pain, biliary obstruction suspected. EXAM: CT ABDOMEN AND PELVIS WITH CONTRAST TECHNIQUE: Multidetector CT imaging of the abdomen and pelvis was performed using the standard protocol following bolus administration of intravenous contrast. CONTRAST:  121mL OMNIPAQUE IOHEXOL 300 MG/ML  SOLN COMPARISON:  CT abdomen pelvis 04/10/2017, CT angio chest 05/18/2017 FINDINGS: Lower chest: Partially visualized trace right pleural effusion. Associated passive atelectasis of the right lower lobe. Dependent right lower lobe consolidation. Masslike right upper and right lower lobe consolidations (4:3, 5:33). Hepatobiliary: The liver measures at upper limits of normal in size. No focal liver abnormality. No gallstones, gallbladder wall thickening, or pericholecystic fluid. No biliary dilatation. Pancreas: No focal lesion. Normal pancreatic contour. No surrounding inflammatory changes. No main pancreatic ductal dilatation. Spleen: Normal in size without focal abnormality. Adrenals/Urinary Tract: No adrenal nodule bilaterally. Bilateral kidneys enhance symmetrically. No hydronephrosis. No hydroureter. The urinary bladder is unremarkable. Stomach/Bowel: Fluid level within the gastric  lumen likely related to ingested material. Stomach is within normal limits. Prominent but not dilated loop of small bowel within the lower anterior abdomen with no definite transition point. No pneumatosis. No evidence of bowel wall thickening or dilatation. Appendix appears normal. Vascular/Lymphatic: No abdominal aorta or iliac aneurysm. 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 No abdominal,  pelvic, or inguinal lymphadenopathy. 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 Uterus and bilateral adnexa are unremarkable. Other: Trace simple free fluid within the pelvis that could be physiologic in etiology. No intraperitoneal free gas. No organized fluid collection. Musculoskeletal: No acute or significant osseous findings. IMPRESSION: 1. Dependent right lower lobe consolidation. Masslike right upper and right lower lobe consolidation. Findings likely represents infection/inflammation. Recommend repeat chest x-ray PA and lateral in 3-4 weeks to exclude underlying malignancy. 2. Trace volume right pleural effusion. 3. 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 Electronically Signed   By: Iven Finn M.D.   On: 11/28/2020 01:35   DG Chest Port 1 View  Result Date: 12/05/2020 CLINICAL DATA:  COVID positive with severe MRSA septic shock EXAM: PORTABLE CHEST 1 VIEW COMPARISON:  12/02/2020 FINDINGS: Right arm PICC line tip is at the cavoatrial junction. Interval removal of NG tube and ET tube. Heart size is normal. Bilateral scratch set small bilateral pleural effusions are again noted. Diffuse interstitial and airspace densities appear unchanged from previous exam. IMPRESSION: 1. Persistent bilateral pleural effusions and bilateral interstitial and airspace opacities compatible with multifocal atypical viral pneumonia. 2. Stable appearance of the right arm PICC line. Electronically Signed   By: Kerby Moors M.D.   On: 12/05/2020 07:01   DG CHEST PORT 1 VIEW  Result Date: 12/02/2020 CLINICAL DATA:  Intubated, COVID EXAM: PORTABLE CHEST 1 VIEW COMPARISON:  12/01/2020 FINDINGS: Endotracheal tube is 3 cm above the carina. NG tube is in the stomach. Layering bilateral pleural effusions. Interstitial prominence throughout the lungs. Bilateral lower lobe airspace disease. No real change since prior study. IMPRESSION: Persistent  interstitial prominence and pulmonary infiltrates. Layering bilateral effusions. No change. Electronically Signed   By: Rolm Baptise M.D.   On: 12/02/2020 06:33   DG Chest Portable 1 View  Result Date: 12/01/2020 CLINICAL DATA:  RIGHT pleural effusion, COVID-19, sepsis EXAM: PORTABLE CHEST 1 VIEW COMPARISON:  Portable exam at 1345 hrs compared to 0921 hrs FINDINGS: Tip of endotracheal tube projects 1.9 cm above carina. NG tube extends into stomach. Stable heart size and mediastinal contours normal. Persistent diffuse pulmonary infiltrates unchanged. Persistent small RIGHT pleural effusion. No pneumothorax following RIGHT thoracentesis. IMPRESSION: No pneumothorax following diagnostic RIGHT thoracentesis. Persistent pulmonary infiltrates and small RIGHT pleural effusion. Electronically Signed   By: Lavonia Dana M.D.   On: 12/01/2020 14:20   DG CHEST PORT 1 VIEW  Result Date: 12/01/2020 CLINICAL DATA:  35 year old ventilated female status post central line placement. EXAM: PORTABLE CHEST 1 VIEW COMPARISON:  Chest x-ray 11/30/2020. FINDINGS: An endotracheal tube is in place with tip 3.3 cm above the carina. There is a right-sided internal jugular central venous catheter with tip terminating in the distal superior vena cava. Widespread areas of ill-defined opacities and areas of interstitial prominence are noted throughout the lungs bilaterally, most evident throughout the mid to lower lungs, minimally improved compared to the prior examination. Moderate right and small left pleural effusions. No pneumothorax. Pulmonary vasculature is largely obscured. Heart size is normal. The patient is rotated to the left on today's exam, resulting in  distortion of the mediastinal contours and reduced diagnostic sensitivity and specificity for mediastinal pathology. IMPRESSION: 1. Support apparatus, as above. 2. The appearance of the lungs is most compatible with severe multilobar bilateral pneumonia related to known COVID  infection. Overall, aeration has slightly improved compared to the prior study. 3. Moderate right and small left pleural effusions similar to the prior examination. Electronically Signed   By: Vinnie Langton M.D.   On: 12/01/2020 09:37   DG CHEST PORT 1 VIEW  Result Date: 11/30/2020 CLINICAL DATA:  COVID-19 positive, tobacco abuse, intubated EXAM: PORTABLE CHEST 1 VIEW COMPARISON:  11/30/2020, 11/28/2020 FINDINGS: Single frontal view of the chest demonstrates interval placement of endotracheal tube overlying tracheal air column, tip 1.7 cm above carina. Enteric catheter passes below diaphragm tip excluded by collimation, side port projecting over the gastric body. Multifocal bilateral ground-glass airspace disease has progressed since prior study, which could reflect progressive pneumonia or superimposed edema. Stable right pleural effusion. No pneumothorax. IMPRESSION: 1. Support devices as above. 2. Progressive diffuse ground-glass airspace disease, which may reflect worsening pneumonia or superimposed edema. 3. Stable right pleural effusion. Electronically Signed   By: Randa Ngo M.D.   On: 11/30/2020 20:07   DG Chest Port 1 View  Result Date: 11/30/2020 CLINICAL DATA:  Shortness of breath, recent pneumonia in a 35 year old female EXAM: PORTABLE CHEST 1 VIEW COMPARISON:  November 28, 2020 FINDINGS: Interstitial and airspace disease worse than on the prior study, superimposed on a background of pulmonary emphysema. Interval development of a small RIGHT-sided pleural effusion. Trachea midline. Cardiomediastinal contours and hilar structures are stable. Nodular component to the airspace process with focal areas of nodularity the for example in the RIGHT upper and LEFT lower lobe. No acute skeletal process on limited assessment. IMPRESSION: 1. Worsening interstitial and airspace disease superimposed on a background of pulmonary emphysema. Findings suspicious for multifocal pneumonia. Given nodular  component would suggest follow-up to ensure resolution and exclude underlying pulmonary nodules. 2. New small RIGHT-sided pleural effusion. Could also consider the possibility of an overlay of heart failure. Electronically Signed   By: Zetta Bills M.D.   On: 11/30/2020 09:21   DG Chest Port 1 View  Result Date: 11/28/2020 CLINICAL DATA:  Nausea, vomiting, fever and cough. EXAM: PORTABLE CHEST 1 VIEW COMPARISON:  04/06/2020 FINDINGS: Artifact overlies the chest. Heart size is normal. There are patchy pulmonary infiltrates in both lungs, more extensive on the right than the left, consistent with widespread bronchopneumonia. This could be bacterial or viral. No lobar consolidation or collapse. No pleural effusion. No significant bone finding. IMPRESSION: Widespread bilateral bronchopneumonia, right more extensive than left. This could be bacterial or viral. Electronically Signed   By: Nelson Chimes M.D.   On: 11/28/2020 00:26   ECHOCARDIOGRAM COMPLETE  Result Date: 12/03/2020    ECHOCARDIOGRAM REPORT   Patient Name:   MARZETTA LANZA Date of Exam: 12/03/2020 Medical Rec #:  [id number]         Height: Accession #:    [id number]        Weight: Date of Birth:  1985-10-20         BSA: Patient Age:    34 years          BP:           120/89 mmHg Patient Gender: F                 HR:           77 bpm. Exam  Location:  [facility] Procedure: 2D Echo Indications:    Endocarditis I38  History:        Patient has prior history of Echocardiogram examinations, most                 recent 03/09/2019. Signs/Symptoms:Murmur; Risk Factors:Current                 Smoker and Diabetes. Substance induced mood disorder ,                 Pneumonia, Cocaine Abuse.  Sonographer:    Leavy Cella RDCS (AE) Referring Phys: [place]  1. Left ventricular ejection fraction, by estimation, is 30 to 35%. The left ventricle has moderately decreased function. The left ventricle demonstrates regional wall motion  abnormalities (see scoring diagram/findings for description). Left ventricular  diastolic parameters were normal.  2. Right ventricular systolic function is moderately reduced. The right ventricular size is normal. There is normal pulmonary artery systolic pressure. The estimated right ventricular systolic pressure is 81.1 mmHg.  3. The mitral valve is grossly normal. Mild to moderate mitral valve regurgitation.  4. The aortic valve is tricuspid. Aortic valve regurgitation is not visualized.  5. The inferior vena cava is normal in size with greater than 50% respiratory variability, suggesting right atrial pressure of 3 mmHg.  6. No obvious valvular vegetations. FINDINGS  Left Ventricle: Left ventricular ejection fraction, by estimation, is 30 to 35%. The left ventricle has moderately decreased function. The left ventricle demonstrates regional wall motion abnormalities. The left ventricular internal cavity size was normal in size. There is no left ventricular hypertrophy. Left ventricular diastolic parameters were normal.  LV Wall Scoring: The mid and distal anterior wall, mid and distal anterior septum, entire apex, mid and distal inferior wall, and mid inferoseptal segment are akinetic. The basal anteroseptal segment, mid inferolateral segment, and mid anterolateral segment are hypokinetic. The basal inferolateral segment, basal anterolateral segment, basal anterior segment, basal inferior segment, and basal inferoseptal segment are normal. Right Ventricle: The right ventricular size is normal. No increase in right ventricular wall thickness. Right ventricular systolic function is moderately reduced. There is normal pulmonary artery systolic pressure. The tricuspid regurgitant velocity is 2.18 m/s, and with an assumed right atrial pressure of 3 mmHg, the estimated right ventricular systolic pressure is 91.4 mmHg. Left Atrium: Left atrial size was normal in size. Right Atrium: Right atrial size was normal in size.  Pericardium: There is no evidence of pericardial effusion. Mitral Valve: The mitral valve is grossly normal. Mild to moderate mitral valve regurgitation, with eccentric posteriorly directed jet. Tricuspid Valve: The tricuspid valve is grossly normal. Tricuspid valve regurgitation is mild. Aortic Valve: The aortic valve is tricuspid. Aortic valve regurgitation is not visualized. Pulmonic Valve: The pulmonic valve was grossly normal. Pulmonic valve regurgitation is not visualized. Aorta: The aortic root is normal in size and structure. Venous: The inferior vena cava is normal in size with greater than 50% respiratory variability, suggesting right atrial pressure of 3 mmHg. IAS/Shunts: No atrial level shunt detected by color flow Doppler.  LEFT VENTRICLE PLAX 2D LVIDd:         3.72 cm  Diastology LVIDs:         3.15 cm  LV e' medial:    6.53 cm/s LV PW:         1.00 cm  LV E/e' medial:  9.7 LV IVS:        0.80 cm  LV  e' lateral:   6.42 cm/s LVOT diam:     1.80 cm  LV E/e' lateral: 9.9 LVOT Area:     2.54 cm  RIGHT VENTRICLE RV S prime:     7.29 cm/s LEFT ATRIUM             RIGHT ATRIUM LA diam:        2.20 cm RA Area:     9.12 cm LA Vol (A2C):   24.3 ml RA Volume:   20.90 ml LA Vol (A4C):   28.3 ml LA Biplane Vol: 27.0 ml   AORTA Ao Root diam: 2.40 cm MITRAL VALVE               TRICUSPID VALVE MV Area (PHT): 4.21 cm    TR Peak grad:   19.0 mmHg MV Decel Time: 180 msec    TR Vmax:        218.00 cm/s MV E velocity: 63.40 cm/s MV A velocity: 70.70 cm/s  SHUNTS MV E/A ratio:  0.90        Systemic Diam: 1.80 cm Rozann Lesches MD Electronically signed by Rozann Lesches MD Signature Date/Time: 12/03/2020/4:11:56 PM    Final    [place] EKG SITE RITE  Result Date: 12/04/2020 If Site Rite image not attached, placement could not be confirmed due to current cardiac rhythm.  US Abdomen Limited RUQ (LIVER/GB)  Result Date: 11/30/2020 CLINICAL DATA:  Right upper quadrant pain and elevated LFTs EXAM: ULTRASOUND ABDOMEN LIMITED  RIGHT UPPER QUADRANT COMPARISON:  None. FINDINGS: Gallbladder: Gallbladder is well distended. Mild gallbladder sludge is noted. No gallstones are seen. Common bile duct: Diameter: 3.2 mm. Liver: Mild increased echogenicity consistent with fatty infiltration. No mass lesion is noted. Portal vein is patent on color Doppler imaging with normal direction of blood flow towards the liver. Other: None. IMPRESSION: Fatty infiltration of the liver. Gallbladder sludge.  No cholelithiasis or wall thickening is noted. Electronically Signed   By: Inez Catalina M.D.   On: 11/30/2020 12:50   US THORACENTESIS ASP PLEURAL SPACE W/IMG GUIDE  Result Date: 12/01/2020 INDICATION: COVID-19 pneumonia, sepsis, small RIGHT pleural effusion EXAM: ULTRASOUND GUIDED DIAGNOSTIC RIGHT THORACENTESIS MEDICATIONS: None. COMPLICATIONS: None immediate. PROCEDURE: An ultrasound guided thoracentesis was thoroughly discussed with the patient and questions answered. The benefits, risks, alternatives and complications were also discussed. The patient understands and wishes to proceed with the procedure. Written consent was obtained. Ultrasound was performed to localize and mark an adequate pocket of fluid in the RIGHT chest. The area was then prepped and draped in the normal sterile fashion. 1% Lidocaine was used for local anesthesia. Under ultrasound guidance a a syringe with a 22 gauge needle was advanced into the small RIGHT pleural effusion. 16 cc of fluid was performed. The catheter was removed and a dressing applied. FINDINGS: A total of approximately 16 mL of slightly cloudy yellow fluid was removed. Sample was sent to the laboratory as requested by the clinical team. IMPRESSION: Successful ultrasound guided right thoracentesis yielding 16 mL of pleural fluid. Electronically Signed   By: Lavonia Dana M.D.   On: 12/01/2020 14:24     CBC Recent Labs  Lab 12/01/20 0700 12/02/20 0510 12/03/20 0459 12/04/20 0409 12/05/20 0416  WBC 8.7 7.2  5.8 13.3* 12.9*  HGB 10.2* 9.9* 10.5* 10.6* 10.9*  HCT 29.4* 28.5* 30.9* 31.9* 32.6*  PLT 167 126* 113* 108* 99*  MCV 85.5 84.8 86.3 87.6 86.2  MCH 29.7 29.5 29.3 29.1 28.8  MCHC 34.7 34.7  34.0 33.2 33.4  RDW 15.3 16.0* 16.8* 16.8* 16.4*  LYMPHSABS 0.9 0.8 1.4 2.1 1.0  MONOABS 0.2 0.3 0.3 0.4 0.5  EOSABS 0.0 0.0 0.0 0.0 0.0  BASOSABS 0.0 0.1 0.0 0.1 0.0    Chemistries  Recent Labs  Lab 12/01/20 0700 12/01/20 0900 12/02/20 0510 12/03/20 0459 12/04/20 0409 12/05/20 0416  NA  --  137 139 141 143 141  K  --  3.8 4.6 5.2* 4.3 3.9  CL  --  107 107 109 111 108  CO2  --  $R'24 26 27 29 30  'dD$ GLUCOSE  --  192* 231* 259* 143* 164*  BUN  --  21* 30* 36* 27* 21*  CREATININE  --  0.67 0.76 0.67 0.51 0.32*  CALCIUM  --  6.6* 6.9* 7.1* 7.2* 7.1*  MG 2.3  --  2.1 2.2 2.0 1.7  AST  --  101* 62* 303* 57* 53*  ALT  --  173* 129* 181* 118* 104*  ALKPHOS  --  438* 459* 544* 399* 332*  BILITOT  --  0.4 0.6 0.6 0.7 0.7   ------------------------------------------------------------------------------------------------------------------ No results for input(s): CHOL, HDL, LDLCALC, TRIG, CHOLHDL, LDLDIRECT in the last 72 hours.  Lab Results  Component Value Date   HGBA1C 14.7 (H) 11/30/2020   ------------------------------------------------------------------------------------------------------------------ No results for input(s): TSH, T4TOTAL, T3FREE, THYROIDAB in the last 72 hours.  Invalid input(s): FREET3 ------------------------------------------------------------------------------------------------------------------ Recent Labs    12/04/20 0409 12/05/20 0417  FERRITIN 389* 367*    Coagulation profile No results for input(s): INR, PROTIME in the last 168 hours.  Recent Labs    12/04/20 0409 12/05/20 0416  DDIMER 3.29* 3.12*    Cardiac Enzymes No results for input(s): CKMB, TROPONINI, MYOGLOBIN in the last 168 hours.  Invalid input(s):  CK ------------------------------------------------------------------------------------------------------------------    Component Value Date/Time   BNP 352.8 (H) 03/08/2019 1847   Kathie Dike M.D on 12/05/2020 at 7:00 PM  Go to [email or web address] - for contact info  [facility] - Office  [phone]

## 2020-12-06 DIAGNOSIS — A419 Sepsis, unspecified organism: Secondary | ICD-10-CM | POA: Diagnosis not present

## 2020-12-06 DIAGNOSIS — F1310 Sedative, hypnotic or anxiolytic abuse, uncomplicated: Secondary | ICD-10-CM | POA: Diagnosis not present

## 2020-12-06 DIAGNOSIS — U071 COVID-19: Secondary | ICD-10-CM | POA: Diagnosis not present

## 2020-12-06 DIAGNOSIS — J189 Pneumonia, unspecified organism: Secondary | ICD-10-CM | POA: Diagnosis not present

## 2020-12-06 LAB — BLOOD GAS, ARTERIAL
Acid-Base Excess: 1 mmol/L (ref 0.0–2.0)
Bicarbonate: 25.3 mmol/L (ref 20.0–28.0)
FIO2: 40
O2 Saturation: 91.3 %
Patient temperature: 37
pCO2 arterial: 38.1 mmHg (ref 32.0–48.0)
pH, Arterial: 7.43 (ref 7.350–7.450)
pO2, Arterial: 65.6 mmHg — ABNORMAL LOW (ref 83.0–108.0)

## 2020-12-06 LAB — CBC WITH DIFFERENTIAL/PLATELET
Abs Immature Granulocytes: 0.17 10*3/uL — ABNORMAL HIGH (ref 0.00–0.07)
Basophils Absolute: 0 10*3/uL (ref 0.0–0.1)
Basophils Relative: 0 %
Eosinophils Absolute: 0 10*3/uL (ref 0.0–0.5)
Eosinophils Relative: 0 %
HCT: 31 % — ABNORMAL LOW (ref 36.0–46.0)
Hemoglobin: 10.3 g/dL — ABNORMAL LOW (ref 12.0–15.0)
Immature Granulocytes: 1 %
Lymphocytes Relative: 1 %
Lymphs Abs: 0.3 10*3/uL — ABNORMAL LOW (ref 0.7–4.0)
MCH: 28.9 pg (ref 26.0–34.0)
MCHC: 33.2 g/dL (ref 30.0–36.0)
MCV: 87.1 fL (ref 80.0–100.0)
Monocytes Absolute: 0.5 10*3/uL (ref 0.1–1.0)
Monocytes Relative: 3 %
Neutro Abs: 17.8 10*3/uL — ABNORMAL HIGH (ref 1.7–7.7)
Neutrophils Relative %: 95 %
Platelets: 139 10*3/uL — ABNORMAL LOW (ref 150–400)
RBC: 3.56 MIL/uL — ABNORMAL LOW (ref 3.87–5.11)
RDW: 16.3 % — ABNORMAL HIGH (ref 11.5–15.5)
WBC: 18.8 10*3/uL — ABNORMAL HIGH (ref 4.0–10.5)
nRBC: 0.1 % (ref 0.0–0.2)

## 2020-12-06 LAB — COMPREHENSIVE METABOLIC PANEL
ALT: 110 U/L — ABNORMAL HIGH (ref 0–44)
AST: 61 U/L — ABNORMAL HIGH (ref 15–41)
Albumin: 2 g/dL — ABNORMAL LOW (ref 3.5–5.0)
Alkaline Phosphatase: 321 U/L — ABNORMAL HIGH (ref 38–126)
Anion gap: 12 (ref 5–15)
BUN: 15 mg/dL (ref 6–20)
CO2: 24 mmol/L (ref 22–32)
Calcium: 7.3 mg/dL — ABNORMAL LOW (ref 8.9–10.3)
Chloride: 104 mmol/L (ref 98–111)
Creatinine, Ser: 0.34 mg/dL — ABNORMAL LOW (ref 0.44–1.00)
GFR, Estimated: >60 mL/min (ref >60)
Glucose, Bld: 206 mg/dL — ABNORMAL HIGH (ref 70–99)
Potassium: 3.3 mmol/L — ABNORMAL LOW (ref 3.5–5.1)
Sodium: 140 mmol/L (ref 135–145)
Total Bilirubin: 1.4 mg/dL — ABNORMAL HIGH (ref 0.3–1.2)
Total Protein: 5.1 g/dL — ABNORMAL LOW (ref 6.5–8.1)

## 2020-12-06 LAB — GLUCOSE, CAPILLARY
Glucose-Capillary: 167 mg/dL — ABNORMAL HIGH (ref 70–99)
Glucose-Capillary: 265 mg/dL — ABNORMAL HIGH (ref 70–99)
Glucose-Capillary: 280 mg/dL — ABNORMAL HIGH (ref 70–99)

## 2020-12-06 LAB — VANCOMYCIN, TROUGH: Vancomycin Tr: 12 ug/mL — ABNORMAL LOW (ref 15–20)

## 2020-12-06 MED ORDER — ADULT MULTIVITAMIN LIQUID CH
15.0000 mL | Freq: Every day | ORAL | Status: DC
  Administered 2020-12-06 – 2020-12-12 (×6): 15 mL via ORAL
  Filled 2020-12-06 (×13): qty 15

## 2020-12-06 MED ORDER — FUROSEMIDE 10 MG/ML IJ SOLN
20.0000 mg | Freq: Two times a day (BID) | INTRAMUSCULAR | Status: DC
  Administered 2020-12-06 – 2020-12-07 (×3): 20 mg via INTRAVENOUS
  Filled 2020-12-06 (×3): qty 2

## 2020-12-06 MED ORDER — POTASSIUM CHLORIDE 10 MEQ/100ML IV SOLN
10.0000 meq | INTRAVENOUS | Status: AC
  Administered 2020-12-06 (×6): 10 meq via INTRAVENOUS
  Filled 2020-12-06 (×6): qty 100

## 2020-12-06 MED ORDER — VANCOMYCIN HCL IN DEXTROSE 1-5 GM/200ML-% IV SOLN
1000.0000 mg | Freq: Two times a day (BID) | INTRAVENOUS | Status: DC
  Administered 2020-12-06 – 2020-12-13 (×14): 1000 mg via INTRAVENOUS
  Filled 2020-12-06 (×14): qty 200

## 2020-12-06 MED ORDER — THIAMINE HCL 100 MG/ML IJ SOLN
100.0000 mg | Freq: Two times a day (BID) | INTRAMUSCULAR | Status: DC
  Administered 2020-12-06 – 2020-12-10 (×10): 100 mg via INTRAVENOUS
  Filled 2020-12-06 (×10): qty 2

## 2020-12-06 NOTE — Progress Notes (Deleted)
Upon assessing, NG tube is found to be misplaced. MD made aware, chest xray revealed NG tube coiled, need to advance

## 2020-12-06 NOTE — Progress Notes (Deleted)
Unable to advance NG tube, R NG tube removed. L NG tube placed, pt tolerated well. MD aware, chest xray ordered to confirm placement

## 2020-12-06 NOTE — Progress Notes (Signed)
Pharmacy Antibiotic Note  [name] is a 35 y.o. female admitted on 11/30/2020 with sepsis/PNA.   Pharmacy has been consulted for vancomycin dosing.  Plan: Vanco trough 12 Increase to vancomycin 1000 mg IV every 12 hours. Monitor labs, c/s, and patient improvement.  Height: 5' (152.4 cm) Weight: 58.5 kg (128 lb 15.5 oz) IBW/kg (Calculated) : 45.5  Temp (24hrs), Avg:97.3 F (36.3 C), Min:97 F (36.1 C), Max:97.7 F (36.5 C)  Recent Labs  Lab 11/30/20 0856 11/30/20 1129 11/30/20 1340 11/30/20 1809 11/30/20 2125 12/01/20 0700 12/01/20 0900 12/02/20 0510 12/03/20 0459 12/04/20 0409 12/05/20 0416 12/06/20 0316  WBC 5.3  --    < >  --   --  8.7  --  7.2 5.8 13.3* 12.9*  --   CREATININE 1.15*  --    < >  --  0.68  --    < > 0.76 0.67 0.51 0.32* 0.34*  LATICACIDVEN 6.8* 3.5*  --  2.1* 2.7*  --   --   --   --   --   --   --   VANCOTROUGH  --   --   --   --   --   --   --   --   --   --   --  12*   < > = values in this interval not displayed.    Estimated Creatinine Clearance: 77.8 mL/min (A) (by C-G formula based on SCr of 0.34 mg/dL (L)).    Allergies  Allergen Reactions  . Codeine Anaphylaxis and Nausea And Vomiting  . Penicillins Rash    As a teenager  . Tramadol Itching    Antimicrobials this admission: Cefepime 2/14 >> 2/18 Metronidazole 2/18 >> Vancomycin 2/15>  Microbiology results: 2/14 BCx: ng 2/15 Pleural fluid- MRSA  2/14 MRSA PCR: positive 2/14 SARS-2 CV is positive  Thank you for allowing pharmacy to be a part of this patient's care.  Judeth Cornfield, PharmD Clinical Pharmacist 12/06/2020 7:38 AM

## 2020-12-06 NOTE — Progress Notes (Addendum)
PROGRESS NOTE    [name]  OIZ:[id number] DOB: 18-Dec-1985 DOA: 11/30/2020 PCP: [facility]    Brief Narrative:  [name] was admitted to the hospital with the working diagnosis of acute hypoxemic respiratory failure due to SARS COVID 19 viral infection, complicated with bacterial pneumonia (parapneumonic effusion/ empyema) with MRSA bacteremia and septic shock.   35 yo female with past medical history of T1 DM, GERD, and cocaine use who presented with weakness and hypoxemia. Recently diagnosed with community acquired pneumonia about 2 days prior to admission, prescribed cefdinir and azithromycin. Her symptoms worsened with with progressive dyspnea, weakness, nausea and vomiting. EMS was called and her oxymetry was found to be 66% and glucose 600 mg/dl. She was transported to the ED. On her initial physical examination BP 110/79, HR 103, T 94.6 and oxymetry 86 to 89%. Lungs with no wheezing or rhonchi, heart S1-S2 present, abdomen soft and non tender, no lower extremity edema.   Sodium 123, potassium 3.1, chloride 82, bicarb 11, glucose 1623, creatinine 1.15, anion gap 30, magnesium 2.9, AST 246, ALT 299, total bilirubins 1.7. White cell count 5.3, hemoglobin 12.2, hematocrit 39.9, platelets 273. SARS COVID-19 positive. Urinalysis > 500 glucose, 0-5 white cells, specific gravity 1.022. Drug screen negative.  Chest radiograph with bilateral multilobar infiltrates, predominantly left lower lobe, right lower lobe, right upper lobe.  Platelike atelectasis left midlung.  EKG 101 bpm, rightward axis, QTC 500, sinus rhythm, Q-wave 83, aVF, V4-V6, no significant ST segment T wave changes.   Patient  was treated with systemic steroids and remdesivir.  She had high oxygen requirements, from heated high flow nasal cannula to non invasive mechanical ventilation and eventually to invasive mechanical ventilation 11/30/20   She did not received tocilizumab due to concomitant  bacterial infection.  Diagnosed with parapneumonic effusion, bacterial pneumonia over infection (MRSA).  Her condition deteriorated into shock requiring vasopressors.   Liberated from mechanical ventilation on 12/03/2020, placed on high flow nasal cannula. CT surgery recommended not invasive intervention, since effusions decreased in size.   Slowly decreasing oxygen requirements but patient very weak and deconditioned.   Assessment & Plan:   Principal Problem:   Severe MRSA sepsis with septic shock -secondary to community-acquired pneumonia with parapneumonic effusion/Empyema Active Problems:   Substance induced mood disorder (HCC)   Benzodiazepine abuse (HCC)   Substance abuse (HCC)   Tobacco abuse   Cocaine abuse (HCC)   DKA, type 1 (HCC)   Pneumonia due to COVID-19 virus   Acute Hypoxic respiratory failure due to COVID-19 (HCC)   Sepsis secondary to community-acquired pneumonia with parapneumonic effusion   CAP (community acquired pneumonia)--- with parapneumonic effusion/MRSA Empyema   Systolic CHF, acute ----EF 30 to 35 %   Rt Sided MRSA Empyema of pleura (HCC)   1. Acute hypoxemic respiratory failure due to SARS COVID 19 viral pneumonia, complicated with bacterial pneumonia over-infection (MRSA) with para-pneumonic effusion/ empyema. Septic shock.  Sp remdesivir #5   Oxygenation has been improving, this am with 8 L/min per HFNC, down to 3 L/min per HFNC.  Wbc yesterday 12.9.  Pleural fluid culture positive for MRSA. Blood cultures with no growth.  Transthoracic echocardiogram with no vegetations.  Chest radiograph 02/19 personally reviewed with interstitial infiltrate bilateral upper lobes, more left than right, very small pleural effusions bilaterally.   Continue systemic steroids with methylprednisolone 40 mg IV daily, antibiotic therapy with IV vancomycin/ metronidazole.    Continue oxymetry monitoring. Will need ling term antibiotic therapy at least 4 weeks  will  consult ID on 02/21.   2. Acute systolic heart failure.  EF 30 -35% with akinetic, mid and distal anterior wall, mid and distal anterior septum, entire apex, mid and distal inferior wall and mid inferoseptal segment.   Fluid balance since admission is 10,321 ml. Urine output over last 24 hrs is 2,035.  Continue diuresis to target negative fluid balance.  May need ischemic work up when recovers from respiratory failure.   3. Hyperkalemia/ hypokalemia/ hyponatremia. Renal function has remained stale with serum cr at 0,34, K is 3,3 and bicarbonate 24.   Continue K correction with Kcl 60 meq IV  4. Acute metabolic encephalopathy (hx of substance abuse). Patient responds to questions and follows commands, continue with episodic confusion but not agitation. Very weak and deconditioned. Now off dexmedetomidine infusion.   Continue neuro checks per unit protocol, aspiration and fall precautions. Add multivitamins and thiamine.  Out of bed to chair tid with meals as tolerated, PT and OT.  Today with difficulty swallowing, consult speech for swallow evaluation.   Continue as needed lorazepam.   5. T1DM with DKA. Fasting glucose this am is 206, with sliding scale insulin for glucose cover and monitoring.  Capillary glucose yesterday down to 79, will continue to hold on basal insulin due to risk of hypotension.   6. Acute anemia ad thrombocytopenia. hgb and plt, stable, continue close monitoring, no current indication for transfusion.   Patient continue to be at high risk for worsening respiratory failure and sepsis  Critical Care time 60 minutes.   Status is: Inpatient  Remains inpatient appropriate because:IV treatments appropriate due to intensity of illness or inability to take PO   Dispo: The patient is from: Home              Anticipated d/c is to: SNF              Anticipated d/c date is: 3 days              Patient currently is not medically stable to d/c.   Difficult to place  patient No   DVT prophylaxis: Enoxaparin   Code Status:   full  Family Communication:  I spoke over the phone with the patient's husband about patient's  condition, plan of care, prognosis and all questions were addressed.    Consultants:   CC  CT surgery   Procedures:   Thoracentesis right 16 ml  Antimicrobials:   Vancomycin     Subjective: Patient very weak and deconditioned, poor oral intake, no nausea or vomiting, suspected difficulty swallowing.   Objective: Vitals:   12/06/20 0300 12/06/20 0400 12/06/20 0500 12/06/20 0600  BP: (!) 125/93 132/89 130/85 (!) 151/93  Pulse:      Resp: (!) 23 (!) 23 (!) 22 (!) 23  Temp:  (!) 97 F (36.1 C)    TempSrc:  Axillary    SpO2: 96% 94% 98% 96%  Weight:  58.5 kg    Height:        Intake/Output Summary (Last 24 hours) at 12/06/2020 0749 Last data filed at 12/06/2020 0600 Gross per 24 hour  Intake 1790.61 ml  Output 1835 ml  Net -44.39 ml   Filed Weights   11/30/20 0846 12/02/20 0346 12/06/20 0400  Weight: 49 kg 55.5 kg 58.5 kg    Examination:   General: deconditioned and ill looking appearing  Neurology: somnolent but easy to arouse, follows commands and answers simple questions.  E ENT: positive pallor, no  icterus, oral mucosa moist Cardiovascular: No JVD. S1-S2 present, rhythmic, no gallops, rubs, or murmurs. No lower extremity edema. Pulmonary: positive breath sounds bilaterally, decreased air movement, no wheezing, scattered rhonchi and  rales. Gastrointestinal. Abdomen soft and non tender Skin. No rashes, anterior  Musculoskeletal: no joint deformities     Data Reviewed: I have personally reviewed following labs and imaging studies  CBC: Recent Labs  Lab 12/01/20 0700 12/02/20 0510 12/03/20 0459 12/04/20 0409 12/05/20 0416  WBC 8.7 7.2 5.8 13.3* 12.9*  NEUTROABS 7.3 6.0 4.1 10.7* 11.3*  HGB 10.2* 9.9* 10.5* 10.6* 10.9*  HCT 29.4* 28.5* 30.9* 31.9* 32.6*  MCV 85.5 84.8 86.3 87.6 86.2  PLT 167  126* 113* 108* 99*   Basic Metabolic Panel: Recent Labs  Lab 12/01/20 0700 12/01/20 0900 12/02/20 0510 12/03/20 0459 12/04/20 0409 12/05/20 0416 12/06/20 0316  NA  --    < > 139 141 143 141 140  K  --    < > 4.6 5.2* 4.3 3.9 3.3*  CL  --    < > 107 109 111 108 104  CO2  --    < > 26 27 29 30 24   GLUCOSE  --    < > 231* 259* 143* 164* 206*  BUN  --    < > 30* 36* 27* 21* 15  CREATININE  --    < > 0.76 0.67 0.51 0.32* 0.34*  CALCIUM  --    < > 6.9* 7.1* 7.2* 7.1* 7.3*  MG 2.3  --  2.1 2.2 2.0 1.7  --    < > = values in this interval not displayed.   GFR: Estimated Creatinine Clearance: 77.8 mL/min (A) (by C-G formula based on SCr of 0.34 mg/dL (L)). Liver Function Tests: Recent Labs  Lab 12/02/20 0510 12/03/20 0459 12/04/20 0409 12/05/20 0416 12/06/20 0316  AST 62* 303* 57* 53* 61*  ALT 129* 181* 118* 104* 110*  ALKPHOS 459* 544* 399* 332* 321*  BILITOT 0.6 0.6 0.7 0.7 1.4*  PROT 4.8* 4.9* 4.6* 4.6* 5.1*  ALBUMIN 1.5* 1.6* 1.5* 1.6* 2.0*   No results for input(s): LIPASE, AMYLASE in the last 168 hours. No results for input(s): AMMONIA in the last 168 hours. Coagulation Profile: No results for input(s): INR, PROTIME in the last 168 hours. Cardiac Enzymes: No results for input(s): CKTOTAL, CKMB, CKMBINDEX, TROPONINI in the last 168 hours. BNP (last 3 results) No results for input(s): PROBNP in the last 8760 hours. HbA1C: No results for input(s): HGBA1C in the last 72 hours. CBG: Recent Labs  Lab 12/04/20 2223 12/05/20 0918 12/05/20 1146 12/05/20 1711 12/05/20 2017  GLUCAP 134* 144* 79 122* 119*   Lipid Profile: No results for input(s): CHOL, HDL, LDLCALC, TRIG, CHOLHDL, LDLDIRECT in the last 72 hours. Thyroid Function Tests: No results for input(s): TSH, T4TOTAL, FREET4, T3FREE, THYROIDAB in the last 72 hours. Anemia Panel: Recent Labs    12/04/20 0409 12/05/20 0417  FERRITIN 389* 367*      Radiology Studies: I have reviewed all of the imaging  during this hospital visit personally     Scheduled Meds: . Chlorhexidine Gluconate Cloth  6 each Topical Daily  . enoxaparin (LOVENOX) injection  40 mg Subcutaneous Q24H  . feeding supplement (GLUCERNA SHAKE)  237 mL Oral QID  . gabapentin  600 mg Oral TID  . insulin aspart  0-5 Units Subcutaneous QHS  . insulin aspart  0-9 Units Subcutaneous TID WC  . methocarbamol  750 mg Oral QID  .  methylPREDNISolone (SOLU-MEDROL) injection  40 mg Intravenous Daily  . sodium chloride flush  10-40 mL Intracatheter Q12H   Continuous Infusions: . dexmedetomidine (PRECEDEX) IV infusion Stopped (12/05/20 1130)  . famotidine (PEPCID) IV Stopped (12/05/20 2300)  . metronidazole 100 mL/hr at 12/06/20 0600  . vancomycin       LOS: 6 days        Jovane Foutz Annett Gula, MD

## 2020-12-07 ENCOUNTER — Inpatient Hospital Stay (HOSPITAL_COMMUNITY): Payer: Medicaid Other

## 2020-12-07 DIAGNOSIS — J189 Pneumonia, unspecified organism: Secondary | ICD-10-CM | POA: Diagnosis not present

## 2020-12-07 DIAGNOSIS — A4102 Sepsis due to Methicillin resistant Staphylococcus aureus: Principal | ICD-10-CM

## 2020-12-07 DIAGNOSIS — A419 Sepsis, unspecified organism: Secondary | ICD-10-CM | POA: Diagnosis not present

## 2020-12-07 DIAGNOSIS — F1310 Sedative, hypnotic or anxiolytic abuse, uncomplicated: Secondary | ICD-10-CM | POA: Diagnosis not present

## 2020-12-07 DIAGNOSIS — U071 COVID-19: Secondary | ICD-10-CM | POA: Diagnosis not present

## 2020-12-07 LAB — GLUCOSE, CAPILLARY
Glucose-Capillary: 200 mg/dL — ABNORMAL HIGH (ref 70–99)
Glucose-Capillary: 210 mg/dL — ABNORMAL HIGH (ref 70–99)
Glucose-Capillary: 270 mg/dL — ABNORMAL HIGH (ref 70–99)
Glucose-Capillary: 188 mg/dL — ABNORMAL HIGH (ref 70–99)

## 2020-12-07 LAB — BASIC METABOLIC PANEL
Anion gap: 13 (ref 5–15)
BUN: 9 mg/dL (ref 6–20)
CO2: 30 mmol/L (ref 22–32)
Calcium: 7.5 mg/dL — ABNORMAL LOW (ref 8.9–10.3)
Chloride: 100 mmol/L (ref 98–111)
Creatinine, Ser: 0.46 mg/dL (ref 0.44–1.00)
GFR, Estimated: >60 mL/min (ref >60)
Glucose, Bld: 203 mg/dL — ABNORMAL HIGH (ref 70–99)
Potassium: 3.3 mmol/L — ABNORMAL LOW (ref 3.5–5.1)
Sodium: 143 mmol/L (ref 135–145)

## 2020-12-07 MED ORDER — INSULIN GLARGINE 100 UNIT/ML ~~LOC~~ SOLN
5.0000 [IU] | Freq: Every day | SUBCUTANEOUS | Status: DC
  Administered 2020-12-07 – 2020-12-10 (×4): 5 [IU] via SUBCUTANEOUS
  Filled 2020-12-07 (×5): qty 0.05

## 2020-12-07 MED ORDER — GABAPENTIN 300 MG PO CAPS
300.0000 mg | ORAL_CAPSULE | Freq: Three times a day (TID) | ORAL | Status: DC
  Administered 2020-12-07 – 2020-12-13 (×15): 300 mg via ORAL
  Filled 2020-12-07 (×15): qty 1

## 2020-12-07 MED ORDER — FUROSEMIDE 10 MG/ML IJ SOLN
20.0000 mg | Freq: Every day | INTRAMUSCULAR | Status: DC
  Administered 2020-12-08 – 2020-12-13 (×6): 20 mg via INTRAVENOUS
  Filled 2020-12-07 (×6): qty 2

## 2020-12-07 MED ORDER — POTASSIUM CHLORIDE 10 MEQ/100ML IV SOLN
10.0000 meq | INTRAVENOUS | Status: AC
Start: 2020-12-07 — End: 2020-12-07
  Administered 2020-12-07 (×6): 10 meq via INTRAVENOUS
  Filled 2020-12-07 (×6): qty 100

## 2020-12-07 NOTE — Evaluation (Signed)
Occupational Therapy Evaluation Patient Details Name: [name] MRN: [id number] DOB: [birth year] Today's Date: 12/07/2020    History of Present Illness [name] is a 35 y.o. female with medical history significant for type 1 diabetes, GERD, and cocaine use who presented to the ED via EMS for evaluation of weakness and significant hypoxemia.  She was noted to be 66% on pulse oximetry on room air and blood sugar was noted to be greater than 600.  She has a history of prior episodes of DKA and actively uses cocaine, but denies any IV drug use.  She presented to the ED approximately 2 days ago and at that time was having some nausea and vomiting was found to have pneumonia for which she was discharged on cefdinir and Zithromax.  She has been compliant with her antibiotics, but has had worsening cough as well as shortness of breath and weakness and now nausea/vomiting and diarrhea as well.  She is vaccinated for COVID, but does not have booster.   Clinical Impression   Pt was agreeable to evaluation but was significantly limited by overall weakness. PT and OT co-treating for pt safety and due to complexity. Pt required maximal assist for supine to sit and maximal assist for stand pivot transfer to chair from EOB. Pt demonstrated P/ROM [facility]. Pt demonstrated overall weakness in bilateral UE noted by shoulder flexion at 3-/5 shoulder flexion. Pt unable to self-feed using spoon due to poor coordination and R UE weakness when attempting to scoop food. Pt will benefit from continued OT services to address strength and endurance to increase ADL levels of functioning.    Follow Up Recommendations  SNF    Equipment Recommendations  None recommended by OT           Precautions / Restrictions Precautions Precautions: Fall Restrictions Weight Bearing Restrictions: No      Mobility Bed Mobility Overal bed mobility: Needs Assistance Bed Mobility: Supine to Sit     Supine to sit: Max  assist     General bed mobility comments: slow labored movement, very weak    Transfers Overall transfer level: Needs assistance Equipment used: 1 person hand held assist Transfers: Sit to/from Stand;Stand Pivot Transfers Sit to Stand: Max assist Stand pivot transfers: Max assist       General transfer comment: patient unable to lock knees due to weakness, required Max stand pivot with knees blocked to transfer to chair    Balance Overall balance assessment: Needs assistance Sitting-balance support: Feet supported;No upper extremity supported Sitting balance-Leahy Scale: Poor Sitting balance - Comments: seated at EOB   Standing balance support: During functional activity;No upper extremity supported Standing balance-Leahy Scale: Poor Standing balance comment: hand held assist                           ADL either performed or assessed with clinical judgement   ADL Overall ADL's : Needs assistance/impaired Eating/Feeding: Total assistance;Sitting Eating/Feeding Details (indicate cue type and reason): Unable to scoop and bring spoon to mouth.                     Toilet Transfer: Maximal Cabin crew Details (indicate cue type and reason): Simulated via EOB to chair.           General ADL Comments: Pt likely requires maximal assist for most ADLs due to severe weakness.     Vision Baseline Vision/History: No visual deficits (None observed during functional tasks.)  Pertinent Vitals/Pain Pain Assessment: No/denies pain Faces Pain Scale: No hurt     Hand Dominance Right (Per observation of reaching to sppon with R hand.)   Extremity/Trunk Assessment Upper Extremity Assessment Upper Extremity Assessment: Defer to OT evaluation RUE Deficits / Details: weak 3-/5 shoulder flexion; 3/5 grip RUE Coordination: decreased fine motor (Unable to functionally maniuplate spoon.) LUE Deficits / Details: weak 3-/5  shoulder flexion; 3/5 grip LUE Coordination: decreased fine motor   Lower Extremity Assessment Lower Extremity Assessment: Generalized weakness   Cervical / Trunk Assessment Cervical / Trunk Assessment: Normal   Communication Communication Communication: No difficulties;Other (comment) (slow to answer questions due to lethargy)   Cognition Arousal/Alertness: Awake/alert Behavior During Therapy: WFL for tasks assessed/performed Overall Cognitive Status: Within Functional Limits for tasks assessed                                 General Comments: Flat                    Home Living Family/patient expects to be discharged to:: Private residence Living Arrangements: Spouse/significant other Available Help at Discharge: Family;Available 24 hours/day Type of Home: House Home Access: Stairs to enter;Ramped entrance (Patient states she has ramped entrance in back) Entrance Stairs-Number of Steps: 2 Entrance Stairs-Rails: None Home Layout: One level     Bathroom Shower/Tub: Chief Strategy Officer: Standard Bathroom Accessibility: Yes   Home Equipment: None   Additional Comments: Pt lethargic during prior living questioning.      Prior Functioning/Environment Level of Independence: Independent        Comments: Household and short distanced community ambulator without AD        OT Problem List: Decreased range of motion;Decreased strength;Decreased activity tolerance;Impaired balance (sitting and/or standing);Decreased coordination;Impaired UE functional use      OT Treatment/Interventions: Self-care/ADL training;Therapeutic exercise;Energy conservation;DME and/or AE instruction;Therapeutic activities;Patient/family education;Balance training    OT Goals(Current goals can be found in the care plan section) Acute Rehab OT Goals Patient Stated Goal: return home with family to assist OT Goal Formulation: With patient Time For Goal  Achievement: 12/21/20 Potential to Achieve Goals: Fair  OT Frequency: Min 2X/week   Barriers to D/C:            Co-evaluation PT/OT/SLP Co-Evaluation/Treatment: Yes Reason for Co-Treatment: Complexity of the patient's impairments (multi-system involvement);For patient/therapist safety PT goals addressed during session: Mobility/safety with mobility;Proper use of DME;Balance OT goals addressed during session: ADL's and self-care;Strengthening/ROM                       End of Session Nurse Communication:  (Nursing notified of pt's weak status and need for max assist. Nursing staff reported they are assisting with feeding.)  Activity Tolerance: Patient limited by fatigue;Patient limited by lethargy Patient left: with chair alarm set;with call bell/phone within reach  OT Visit Diagnosis: Unsteadiness on feet (R26.81);Muscle weakness (generalized) (M62.81);Feeding difficulties (R63.3)                Time: 0865-7846 OT Time Calculation (min): 24 min Charges:  OT General Charges $OT Visit: 1 Visit OT Evaluation $OT Eval High Complexity: 1 High  Stephana Morell OT, MOT   Danie Chandler 12/07/2020, 12:26 PM

## 2020-12-07 NOTE — TOC Progression Note (Signed)
Transition of Care [facility]) - Progression Note    Patient Details  Name: [name] MRN: [id number] Date of Birth: 04-Dec-1985  Transition of Care [facility]) CM/SW Contact  Roda Shutters Margretta Sidle, RN Phone Number: 12/07/2020, 3:39 PM  The Appropriate Use Committee has met to discuss this patient's plan of care to review recommendations for post-acute treatment options.  It was recommended that we continue to monitor patient progress prior to proceeding with SNF work up.

## 2020-12-07 NOTE — Progress Notes (Addendum)
PROGRESS NOTE    [name]  FHQ:[id number] DOB: 1985/12/15 DOA: 11/30/2020 PCP: [facility]    Brief Narrative:  [name] was admitted to the hospital with the working diagnosis of acute hypoxemic respiratory failure due to SARS COVID 19 viral infection, complicated with bacterial pneumonia (parapneumonic effusion/ empyema) with MRSA bacteremia and septic shock.   35 yo female with past medical history of T1 DM, GERD, and cocaine use who presented with weakness and hypoxemia. Recently diagnosed with community acquired pneumonia about 2 days prior to admission, prescribed cefdinir and azithromycin. Her symptoms worsened with with progressive dyspnea, weakness, nausea and vomiting. EMS was called and her oxymetry was found to be 66% and glucose 600 mg/dl. She was transported to the ED. On her initial physical examination BP 110/79, HR 103, T 94.6 and oxymetry 86 to 89%. Lungs with no wheezing or rhonchi, heart S1-S2 present, abdomen soft and non tender, no lower extremity edema.   Sodium 123, potassium 3.1, chloride 82, bicarb 11, glucose 1623, creatinine 1.15, anion gap 30, magnesium 2.9, AST 246, ALT 299, total bilirubins 1.7. White cell count 5.3, hemoglobin 12.2, hematocrit 39.9, platelets 273. SARS COVID-19 positive. Urinalysis > 500 glucose, 0-5 white cells, specific gravity 1.022. Drug screen negative.  Chest radiograph with bilateral multilobar infiltrates, predominantly left lower lobe, right lower lobe, right upper lobe.  Platelike atelectasis left midlung.  EKG 101 bpm, rightward axis, QTC 500, sinus rhythm, Q-wave 83, aVF, V4-V6, no significant ST segment T wave changes.   Patient  was treated with systemic steroids and remdesivir.  She had high oxygen requirements, from heated high flow nasal cannula to non invasive mechanical ventilation and eventually to invasive mechanical ventilation 11/30/20   She did not received tocilizumab due to  concomitant bacterial infection.  Diagnosed with parapneumonic effusion/ empyema, bacterial pneumonia over infection (MRSA).  Her condition deteriorated into shock requiring vasopressors.   Liberated from mechanical ventilation on 12/03/2020, placed on high flow nasal cannula. CT surgery recommended not invasive intervention, since effusions had decreased in size.   Slowly decreasing oxygen requirements but patient very weak and deconditioned.    Assessment & Plan:   Principal Problem:   Severe MRSA sepsis with septic shock -secondary to community-acquired pneumonia with parapneumonic effusion/Empyema Active Problems:   Substance induced mood disorder (HCC)   Benzodiazepine abuse (HCC)   Substance abuse (HCC)   Tobacco abuse   Cocaine abuse (HCC)   DKA, type 1 (HCC)   Pneumonia due to COVID-19 virus   Acute Hypoxic respiratory failure due to COVID-19 (HCC)   Sepsis secondary to community-acquired pneumonia with parapneumonic effusion   CAP (community acquired pneumonia)--- with parapneumonic effusion/MRSA Empyema   Systolic CHF, acute ----EF 30 to 35 %   Rt Sided MRSA Empyema of pleura (HCC)    1. Acute hypoxemic respiratory failure due to SARS COVID 19 viral pneumonia, complicated with bacterial pneumonia over-infection (MRSA) with para-pneumonic effusion/ empyema. Septic shock.  Sp remdesivir #5  Pleural fluid culture positive for MRSA. Blood cultures with no growth.  Transthoracic echocardiogram with no vegetations.  (no tocilizumab or baricitinib due to concomitant bacterial infection)  Today oxygenation is 93% on 3 L/min per [place].  Wbc  18,8 yesterday.   On methylprednisolone 40 mg IV daily, continue antibiotic therapy with IV vancomycin/ metronidazole.   Consulted Dr Earlene Plater ID (phone)  for duration of antibiotic therapy, recommendations from 2 to 3 weeks of IV vancomycin or linezolid.    Out of bed to chair tid  with meals, PT and OT evaluation.  Follow chest film  today after diuresis.  2. Acute systolic heart failure.  EF 30 -35% with akinetic, mid and distal anterior wall, mid and distal anterior septum, entire apex, mid and distal inferior wall and mid inferoseptal segment.   Started with furosemide 20 mg Iv for diuresis, her urine output over last 24 hrs has been 4,900 ml.  Follow chest film today, and continue diuresis to target negative fluid balance.  Holding on b blocker and ace inh due to risk of hypotension.   3. Hyperkalemia/ hypokalemia/ hyponatremia. Stable renal function with serum cr at 0,46, K is 3,3 and bicarbonate 30,  Continue K correction with Kc, 60 meq IV and follow on renal function in am. Continue diuresis with furosemide.  Follow up renal function in am, including Mg.   4. Acute metabolic encephalopathy (hx of substance abuse). Now off dexmedetomidine infusion.   Slowly improving, but continue to be very weak and deconditioned,  Continue with aspiration and fall precautions. On multivitamins and thiamine.  Continue out of bed to chair tid with meals as tolerated, PT and OT.  Follow with speech evaluation, for now continue with dysphagia one diet.   Decrease gabapentin to 300 mg tid from 600 mg tid.  PRN lorazepam for agitation.    5. T1DM with DKA. Capillary glucose 265, 280, 200, 210, with fasting glucose at 203.  Add basal insulin 5 units daily and continue sliding scale for glucose cover and monitoring. Patient with very poor oral intake.   6. Acute anemia ad thrombocytopenia. Hgb 10,3 and hct 31 continue close monitoring of call count.    Patient continue to be at high risk for worsening sepsis   Status is: Inpatient  Remains inpatient appropriate because:IV treatments appropriate due to intensity of illness or inability to take PO   Dispo: The patient is from: Home              Anticipated d/c is to: SNF              Anticipated d/c date is: > 3 days              Patient currently is not medically  stable to d/c.   Difficult to place patient Yes   DVT prophylaxis: Enoxaparin   Code Status:   full  Family Communication:  No family at the bedside, I spoke with her husband yesterday.      Consultants:   CC  CT surgery   Procedures:  Thoracentesis right 16 ml   Subjective: Patient is very weak and deconditioned, no nausea or vomiting, no chest pain. Poor communication, dyspnea reports to be better.   Objective: Vitals:   12/07/20 0400 12/07/20 0409 12/07/20 0500 12/07/20 0600  BP: 137/81  117/67 (!) 161/79  Pulse: 98  (!) 106 (!) 112  Resp: (!) 22  19 (!) 33  Temp:  (!) 97.4 F (36.3 C)    TempSrc:  Axillary    SpO2: 96%  95% (!) 89%  Weight:  51.1 kg    Height:        Intake/Output Summary (Last 24 hours) at 12/07/2020 0752 Last data filed at 12/07/2020 0410 Gross per 24 hour  Intake 1796.14 ml  Output 4900 ml  Net -3103.86 ml   Filed Weights   12/02/20 0346 12/06/20 0400 12/07/20 0409  Weight: 55.5 kg 58.5 kg 51.1 kg    Examination:   General: deconditioned and ill looming appearing  Neurology: Awake, episodic somnolence, no nausea or vomiting.   E ENT: positive pallor, no icterus, oral mucosa moist Cardiovascular: No JVD. S1-S2 present, rhythmic, no gallops, rubs, or murmurs. No lower extremity edema. Pulmonary: positive breath sounds bilaterally, bilateral rhonchi and rales Gastrointestinal. Abdomen soft and non tender Skin. No rashes Musculoskeletal: no joint deformities     Data Reviewed: I have personally reviewed following labs and imaging studies  CBC: Recent Labs  Lab 12/02/20 0510 12/03/20 0459 12/04/20 0409 12/05/20 0416 12/06/20 1305  WBC 7.2 5.8 13.3* 12.9* 18.8*  NEUTROABS 6.0 4.1 10.7* 11.3* 17.8*  HGB 9.9* 10.5* 10.6* 10.9* 10.3*  HCT 28.5* 30.9* 31.9* 32.6* 31.0*  MCV 84.8 86.3 87.6 86.2 87.1  PLT 126* 113* 108* 99* 139*   Basic Metabolic Panel: Recent Labs  Lab 12/01/20 0700 12/01/20 0900 12/02/20 0510  12/03/20 0459 12/04/20 0409 12/05/20 0416 12/06/20 0316 12/07/20 0456  NA  --    < > 139 141 143 141 140 143  K  --    < > 4.6 5.2* 4.3 3.9 3.3* 3.3*  CL  --    < > 107 109 111 108 104 100  CO2  --    < > 26 27 29 30 24 30   GLUCOSE  --    < > 231* 259* 143* 164* 206* 203*  BUN  --    < > 30* 36* 27* 21* 15 9  CREATININE  --    < > 0.76 0.67 0.51 0.32* 0.34* 0.46  CALCIUM  --    < > 6.9* 7.1* 7.2* 7.1* 7.3* 7.5*  MG 2.3  --  2.1 2.2 2.0 1.7  --   --    < > = values in this interval not displayed.   GFR: Estimated Creatinine Clearance: 69.8 mL/min (by C-G formula based on SCr of 0.46 mg/dL). Liver Function Tests: Recent Labs  Lab 12/02/20 0510 12/03/20 0459 12/04/20 0409 12/05/20 0416 12/06/20 0316  AST 62* 303* 57* 53* 61*  ALT 129* 181* 118* 104* 110*  ALKPHOS 459* 544* 399* 332* 321*  BILITOT 0.6 0.6 0.7 0.7 1.4*  PROT 4.8* 4.9* 4.6* 4.6* 5.1*  ALBUMIN 1.5* 1.6* 1.5* 1.6* 2.0*   No results for input(s): LIPASE, AMYLASE in the last 168 hours. No results for input(s): AMMONIA in the last 168 hours. Coagulation Profile: No results for input(s): INR, PROTIME in the last 168 hours. Cardiac Enzymes: No results for input(s): CKTOTAL, CKMB, CKMBINDEX, TROPONINI in the last 168 hours. BNP (last 3 results) No results for input(s): PROBNP in the last 8760 hours. HbA1C: No results for input(s): HGBA1C in the last 72 hours. CBG: Recent Labs  Lab 12/05/20 1711 12/05/20 2017 12/06/20 0805 12/06/20 1616 12/06/20 2011  GLUCAP 122* 119* 167* 265* 280*   Lipid Profile: No results for input(s): CHOL, HDL, LDLCALC, TRIG, CHOLHDL, LDLDIRECT in the last 72 hours. Thyroid Function Tests: No results for input(s): TSH, T4TOTAL, FREET4, T3FREE, THYROIDAB in the last 72 hours. Anemia Panel: Recent Labs    12/05/20 0417  FERRITIN 367*      Radiology Studies: I have reviewed all of the imaging during this hospital visit personally     Scheduled Meds: . Chlorhexidine  Gluconate Cloth  6 each Topical Daily  . enoxaparin (LOVENOX) injection  40 mg Subcutaneous Q24H  . feeding supplement (GLUCERNA SHAKE)  237 mL Oral QID  . furosemide  20 mg Intravenous Q12H  . gabapentin  600 mg Oral TID  . insulin aspart  0-5 Units Subcutaneous QHS  . insulin aspart  0-9 Units Subcutaneous TID WC  . methocarbamol  750 mg Oral QID  . methylPREDNISolone (SOLU-MEDROL) injection  40 mg Intravenous Daily  . multivitamin  15 mL Oral Daily  . sodium chloride flush  10-40 mL Intracatheter Q12H  . thiamine injection  100 mg Intravenous BID   Continuous Infusions: . famotidine (PEPCID) IV Stopped (12/06/20 2231)  . metronidazole 500 mg (12/07/20 0513)  . vancomycin 1,000 mg (12/07/20 0512)     LOS: 7 days        Mauricio Annett Gula, MD

## 2020-12-07 NOTE — Plan of Care (Signed)
  Problem: Acute Rehab OT Goals (only OT should resolve) Goal: Pt. Will Perform Eating Flowsheets (Taken 12/07/2020 1231) Pt Will Perform Eating:  with supervision  sitting Goal: Pt. Will Perform Grooming Flowsheets (Taken 12/07/2020 1231) Pt Will Perform Grooming:  with supervision  sitting Goal: Pt. Will Perform Upper Body Bathing Flowsheets (Taken 12/07/2020 1231) Pt Will Perform Upper Body Bathing:  with mod assist  sitting  bed level Goal: Pt. Will Perform Upper Body Dressing Flowsheets (Taken 12/07/2020 1231) Pt Will Perform Upper Body Dressing:  with min assist  bed level  sitting Goal: Pt. Will Perform Lower Body Dressing Flowsheets (Taken 12/07/2020 1231) Pt Will Perform Lower Body Dressing:  with mod assist  bed level Goal: Pt. Will Transfer To Toilet Flowsheets (Taken 12/07/2020 1231) Pt Will Transfer to Toilet:  with mod assist  stand pivot transfer Goal: Pt/Caregiver Will Perform Home Exercise Program Flowsheets (Taken 12/07/2020 1231) Pt/caregiver will Perform Home Exercise Program:  Increased ROM  Increased strength  Both right and left upper extremity  With Supervision  Independently

## 2020-12-07 NOTE — Evaluation (Signed)
Clinical/Bedside Swallow Evaluation Patient Details  Name: [name] MRN: [id number] Date of Birth: 1986/09/05  Today's Date: 12/07/2020 Time: SLP Start Time (ACUTE ONLY): 1425 SLP Stop Time (ACUTE ONLY): 1450 SLP Time Calculation (min) (ACUTE ONLY): 25 min  Past Medical History:  Past Medical History:  Diagnosis Date  . Acid reflux 2010  . Diabetes mellitus   . DKA 05/01/2011  . Polysubstance abuse [facility])    Past Surgical History:  Past Surgical History:  Procedure Laterality Date  . TUBAL LIGATION    . WOUND EXPLORATION Left 07/26/2014   Procedure: Irrigation, debridement and exploration of elbow wounds;  Surgeon: Betha Loa, MD;  Location: [facility] OR;  Service: Orthopedics;  Laterality: Left;   HPI:  [name] was admitted to the hospital with the working diagnosis of acute hypoxemic respiratory failure due to SARS COVID 19 viral infection, complicated with bacterial pneumonia (parapneumonic effusion/ empyema) with MRSA bacteremia and septic shock. 35 yo female with past medical history of T1 DM, GERD, and cocaine use who presented with weakness and hypoxemia. Recently diagnosed with community acquired pneumonia about 2 days prior to admission, prescribed cefdinir and azithromycin. Her symptoms worsened with with progressive dyspnea, weakness, nausea and vomiting. EMS was called and her oxymetry was found to be 66% and glucose 600 mg/dl. She was transported to the ED.  On her initial physical examination BP 110/79, HR 103, T 94.6 and oxymetry 86 to 89%. Lungs with no wheezing or rhonchi, heart S1-S2 present, abdomen soft and non tender, no lower extremity edema. She was intubated 2/14-2/17/22. BSE requested.   Assessment / Plan / Recommendation Clinical Impression  Pt presents with overall lethargy and weakness which negatively impacts her ability to self feed. Oral motor examination is WNL with the exception of edentulous status (dentures could not be located in room). Pt with  clear vocal quality with reduced vocal intensity, adequate cued couch. Pt bobs head/neck around as if too heavy to hold up. She stated that it felt like she might choke when she swallows, however she dranks 3oz water via sequential swallows without signs of reduced airway protection. She was intubate for a few days, so perhaps she has some discomfort from that. Pt agreeable to graham crackers in puree despite not having her dentures and demonstrates impaired mastication and prolonged oral transit. Recommend D2/fine chop and thin liquids with feeder assistance and aspiration and reflux precautions, continue presenting po medications whole in puree. SLP will follow during acute stay and anticpate advancement as Pt becomes stronger. RN will ask family for dentures. SLP Visit Diagnosis: Dysphagia, unspecified (R13.10)    Aspiration Risk  Mild aspiration risk    Diet Recommendation Dysphagia 2 (Fine chop);Thin liquid   Liquid Administration via: Cup;Straw Medication Administration: Whole meds with puree Supervision: Staff to assist with self feeding;Full supervision/cueing for compensatory strategies Compensations: Slow rate;Small sips/bites Postural Changes: Seated upright at 90 degrees;Remain upright for at least 30 minutes after po intake    Other  Recommendations Oral Care Recommendations: Oral care BID;Staff/trained caregiver to provide oral care Other Recommendations: Clarify dietary restrictions   Follow up Recommendations Skilled Nursing facility;Other (comment) (pending clinical course/progress)      Frequency and Duration min 2x/week  1 week       Prognosis Prognosis for Safe Diet Advancement: Good Barriers to Reach Goals:  (deconditioning)      Swallow Study   General Date of Onset: 11/30/20 HPI: [name] was admitted to the hospital with the working diagnosis of acute  hypoxemic respiratory failure due to SARS COVID 19 viral infection, complicated with bacterial pneumonia  (parapneumonic effusion/ empyema) with MRSA bacteremia and septic shock. 35 yo female with past medical history of T1 DM, GERD, and cocaine use who presented with weakness and hypoxemia. Recently diagnosed with community acquired pneumonia about 2 days prior to admission, prescribed cefdinir and azithromycin. Her symptoms worsened with with progressive dyspnea, weakness, nausea and vomiting. EMS was called and her oxymetry was found to be 66% and glucose 600 mg/dl. She was transported to the ED.  On her initial physical examination BP 110/79, HR 103, T 94.6 and oxymetry 86 to 89%. Lungs with no wheezing or rhonchi, heart S1-S2 present, abdomen soft and non tender, no lower extremity edema. She was intubated 2/14-2/17/22. BSE requested. Type of Study: Bedside Swallow Evaluation Previous Swallow Assessment: None on record Diet Prior to this Study: Dysphagia 1 (puree);Thin liquids Temperature Spikes Noted: No Respiratory Status: Nasal cannula History of Recent Intubation: Yes Length of Intubations (days): 4 days Date extubated: 12/03/20 Behavior/Cognition: Alert;Uncooperative;Requires cueing Oral Cavity Assessment: Within Functional Limits Oral Care Completed by SLP: Recent completion by staff Oral Cavity - Dentition: Edentulous (Pt reportedly has dentures, but not present) Vision: Functional for self-feeding Self-Feeding Abilities: Total assist (hand over hand for cup sips, very weak) Patient Positioning: Upright in bed Baseline Vocal Quality: Normal;Low vocal intensity Volitional Cough: Strong Volitional Swallow: Able to elicit    Oral/Motor/Sensory Function Overall Oral Motor/Sensory Function: Within functional limits   Ice Chips Ice chips: Within functional limits Presentation: Spoon   Thin Liquid Thin Liquid: Within functional limits Presentation: Cup;Straw    Nectar Thick Nectar Thick Liquid: Not tested   Honey Thick Honey Thick Liquid: Not tested   Puree Puree: Within functional  limits Presentation: Spoon   Solid     Solid: Impaired Presentation: Spoon Oral Phase Impairments: Impaired mastication Oral Phase Functional Implications: Prolonged oral transit     Thank you,  Havery Moros, CCC-SLP [phone]  Jaecion Dempster 12/07/2020,3:02 PM

## 2020-12-07 NOTE — Plan of Care (Signed)
  Problem: SLP Dysphagia Goals Goal: Patient will utilize recommended strategies Description: Patient will utilize recommended strategies during swallow to increase swallowing safety with Flowsheets (Taken 12/07/2020 1503) Patient will utilize recommended strategies during swallow to increase swallowing safety with: min assist   Thank you,  Havery Moros, CCC-SLP [phone]

## 2020-12-07 NOTE — Progress Notes (Signed)
Inpatient Diabetes Program Recommendations  AACE/ADA: New Consensus Statement on Inpatient Glycemic Control   Target Ranges:  Prepandial:   less than 140 mg/dL      Peak postprandial:   less than 180 mg/dL (1-2 hours)      Critically ill patients:  140 - 180 mg/dL   Results for SHANDY, CHECO (MRN [id number]) as of 12/07/2020 09:46  Ref. Range 12/06/2020 08:05 12/06/2020 16:16 12/06/2020 20:11 12/07/2020 08:26  Glucose-Capillary Latest Ref Range: 70 - 99 mg/dL 962 (H) 836 (H) 629 (H) 200 (H)  Results for ZORAYA, FIORENZA (MRN [id number]) as of 12/07/2020 09:46  Ref. Range 11/30/2020 08:56  Glucose Latest Ref Range: 70 - 99 mg/dL 5,465 [facility])  Results for AMALYA, SALMONS (MRN [id number]) as of 12/07/2020 09:46  Ref. Range 04/06/2020 19:48 11/30/2020 13:40  Hemoglobin A1C Latest Ref Range: 4.8 - 5.6 % 14.1 (H) 14.7 (H)   Review of Glycemic Control  Diabetes history:DM1 (makes NO insulin; requires basal, correction, and meal coverage insulin) Outpatient Diabetes medications:Lantus 35 units daily, Novolog 1-15 units TID with meals (1 unit per 15 grams of carbs) Current orders for Inpatient glycemic control: Novolog 0-9 units TID with meals, Novolog 0-5 units QHS; Solumedrol 40 mg daily  Inpatient Diabetes Program Recommendations:    Insulin: If MD prefers to continue to hold basal insulin, would recommend changing CBGs to Q4H and Novolog 0-9 units Q4H to prevent risk of DKA.  If glucose continues to be consistently over 180 mg/dl with Novolog correction Q4H, may want to consider ordering Lantus 5 units Q24H (based on 51.1 kg x 0.1 units).  Thanks, .Orlando Penner, RN, MSN, CDE Diabetes Coordinator Inpatient Diabetes Program [phone] (Team Pager from 8am to 5pm)

## 2020-12-07 NOTE — Plan of Care (Signed)
  Problem: Acute Rehab PT Goals(only PT should resolve) Goal: Pt Will Go Supine/Side To Sit Outcome: Progressing Flowsheets (Taken 12/07/2020 1229) Pt will go Supine/Side to Sit:  with minimal assist  with moderate assist Goal: Patient Will Transfer Sit To/From Stand Outcome: Progressing Flowsheets (Taken 12/07/2020 1229) Patient will transfer sit to/from stand:  with moderate assist  with minimal assist Goal: Pt Will Transfer Bed To Chair/Chair To Bed Outcome: Progressing Flowsheets (Taken 12/07/2020 1229) Pt will Transfer Bed to Chair/Chair to Bed: with mod assist Goal: Pt Will Ambulate Outcome: Progressing Flowsheets (Taken 12/07/2020 1229) Pt will Ambulate:  25 feet  with minimal assist  with moderate assist  with rolling walker   12:29 PM, 12/07/20 Ocie Bob, MPT Physical Therapist with [facility] 336 [phone] office [phone] mobile phone

## 2020-12-07 NOTE — Evaluation (Signed)
Physical Therapy Evaluation Patient Details Name: [name] MRN: [id number] DOB: 04-26-1986 Today's Date: 12/07/2020   History of Present Illness  [name] is a 35 y.o. female with medical history significant for type 1 diabetes, GERD, and cocaine use who presented to the ED via EMS for evaluation of weakness and significant hypoxemia.  She was noted to be 66% on pulse oximetry on room air and blood sugar was noted to be greater than 600.  She has a history of prior episodes of DKA and actively uses cocaine, but denies any IV drug use.  She presented to the ED approximately 2 days ago and at that time was having some nausea and vomiting was found to have pneumonia for which she was discharged on cefdinir and Zithromax.  She has been compliant with her antibiotics, but has had worsening cough as well as shortness of breath and weakness and now nausea/vomiting and diarrhea as well.  She is vaccinated for COVID, but does not have booster.    Clinical Impression  Patient present very weak and slightly lethargic and able to answer questions with increased time.  Patient demonstrates slow labored movement for sitting up at bedside with frequent falling backwards due to weakness, at severe risk for falls, unable to lock knees when attempting sit to stands and required Max assist stand pivot with knees blocked to transfer to chair - RN notified.  Patient will benefit from continued physical therapy in hospital and recommended venue below to increase strength, balance, endurance for safe ADLs and gait.    Follow Up Recommendations SNF    Equipment Recommendations  Rolling walker with 5" wheels    Recommendations for Other Services       Precautions / Restrictions Precautions Precautions: Fall Restrictions Weight Bearing Restrictions: No      Mobility  Bed Mobility Overal bed mobility: Needs Assistance Bed Mobility: Supine to Sit     Supine to sit: Max assist     General  bed mobility comments: slow labored movement, very weak    Transfers Overall transfer level: Needs assistance Equipment used: 1 person hand held assist Transfers: Sit to/from Stand;Stand Pivot Transfers Sit to Stand: Max assist Stand pivot transfers: Max assist       General transfer comment: patient unable to lock knees due to weakness, required Max stand pivot with knees blocked to transfer to chair  Ambulation/Gait                Stairs            Wheelchair Mobility    Modified Rankin (Stroke Patients Only)       Balance Overall balance assessment: Needs assistance Sitting-balance support: Feet supported;No upper extremity supported Sitting balance-Leahy Scale: Poor Sitting balance - Comments: seated at EOB   Standing balance support: During functional activity;No upper extremity supported Standing balance-Leahy Scale: Poor Standing balance comment: hand held assist                             Pertinent Vitals/Pain Pain Assessment: No/denies pain    Home Living Family/patient expects to be discharged to:: Private residence Living Arrangements: Spouse/significant other Available Help at Discharge: Family;Available 24 hours/day Type of Home: House Home Access: Stairs to enter;Ramped entrance (Patient states she has ramped entrance in back) Entrance Stairs-Rails: None Entrance Stairs-Number of Steps: 2 Home Layout: One level Home Equipment: None Additional Comments: Pt lethargic during prior living questioning.  Prior Function Level of Independence: Independent         Comments: Household and short distanced community ambulator without AD     Hand Dominance        Extremity/Trunk Assessment   Upper Extremity Assessment Upper Extremity Assessment: Defer to OT evaluation    Lower Extremity Assessment Lower Extremity Assessment: Generalized weakness    Cervical / Trunk Assessment Cervical / Trunk Assessment: Normal   Communication   Communication: No difficulties;Other (comment) (slow to answer questions due to lethargy)  Cognition Arousal/Alertness: Awake/alert Behavior During Therapy: WFL for tasks assessed/performed Overall Cognitive Status: Within Functional Limits for tasks assessed                                        General Comments      Exercises     Assessment/Plan    PT Assessment Patient needs continued PT services  PT Problem List Decreased strength;Decreased activity tolerance;Decreased balance;Decreased mobility       PT Treatment Interventions DME instruction;Gait training;Stair training;Functional mobility training;Therapeutic activities;Therapeutic exercise;Patient/family education;Balance training    PT Goals (Current goals can be found in the Care Plan section)  Acute Rehab PT Goals Patient Stated Goal: return home with family to assist PT Goal Formulation: With patient Time For Goal Achievement: 12/21/20 Potential to Achieve Goals: Good    Frequency Min 3X/week   Barriers to discharge        Co-evaluation PT/OT/SLP Co-Evaluation/Treatment: Yes Reason for Co-Treatment: Complexity of the patient's impairments (multi-system involvement);For patient/therapist safety PT goals addressed during session: Mobility/safety with mobility;Proper use of DME;Balance OT goals addressed during session: ADL's and self-care;Strengthening/ROM       AM-PAC PT "6 Clicks" Mobility  Outcome Measure Help needed turning from your back to your side while in a flat bed without using bedrails?: A Lot Help needed moving from lying on your back to sitting on the side of a flat bed without using bedrails?: A Lot Help needed moving to and from a bed to a chair (including a wheelchair)?: A Lot Help needed standing up from a chair using your arms (e.g., wheelchair or bedside chair)?: A Lot Help needed to walk in hospital room?: Total Help needed climbing 3-5 steps with a  railing? : Total 6 Click Score: 10    End of Session Equipment Utilized During Treatment: Oxygen Activity Tolerance: Patient tolerated treatment well;Patient limited by fatigue Patient left: in chair;with call bell/phone within reach;with chair alarm set;with family/visitor present Nurse Communication: Mobility status PT Visit Diagnosis: Unsteadiness on feet (R26.81);Other abnormalities of gait and mobility (R26.89);Muscle weakness (generalized) (M62.81)    Time: 5885-0277 PT Time Calculation (min) (ACUTE ONLY): 32 min   Charges:   PT Evaluation $PT Eval Moderate Complexity: 1 Mod PT Treatments $Therapeutic Activity: 23-37 mins        12:28 PM, 12/07/20 Ocie Bob, MPT Physical Therapist with [facility] [phone] office [phone] mobile phone

## 2020-12-07 NOTE — Progress Notes (Signed)
Pulmonary plan in place.  Defer further management to hospitalist team.  PCCM will sign off.  Please call if additional assistance needed while she is in hospital.  D/w Dr. Ella Jubilee.  Coralyn Helling, MD [facility] Pulmonary/Critical Care Pager - [phone] 12/07/2020, 3:24 PM

## 2020-12-08 DIAGNOSIS — U071 COVID-19: Secondary | ICD-10-CM | POA: Diagnosis not present

## 2020-12-08 DIAGNOSIS — F1410 Cocaine abuse, uncomplicated: Secondary | ICD-10-CM | POA: Diagnosis not present

## 2020-12-08 DIAGNOSIS — A4102 Sepsis due to Methicillin resistant Staphylococcus aureus: Secondary | ICD-10-CM | POA: Diagnosis not present

## 2020-12-08 DIAGNOSIS — F1310 Sedative, hypnotic or anxiolytic abuse, uncomplicated: Secondary | ICD-10-CM | POA: Diagnosis not present

## 2020-12-08 DIAGNOSIS — A419 Sepsis, unspecified organism: Secondary | ICD-10-CM | POA: Diagnosis not present

## 2020-12-08 DIAGNOSIS — E1010 Type 1 diabetes mellitus with ketoacidosis without coma: Secondary | ICD-10-CM | POA: Diagnosis not present

## 2020-12-08 DIAGNOSIS — J1282 Pneumonia due to coronavirus disease 2019: Secondary | ICD-10-CM | POA: Diagnosis not present

## 2020-12-08 LAB — CBC WITH DIFFERENTIAL/PLATELET
Abs Immature Granulocytes: 0.09 10*3/uL — ABNORMAL HIGH (ref 0.00–0.07)
Basophils Absolute: 0 10*3/uL (ref 0.0–0.1)
Basophils Relative: 0 %
Eosinophils Absolute: 0 10*3/uL (ref 0.0–0.5)
Eosinophils Relative: 0 %
HCT: 26.3 % — ABNORMAL LOW (ref 36.0–46.0)
Hemoglobin: 8.8 g/dL — ABNORMAL LOW (ref 12.0–15.0)
Immature Granulocytes: 1 %
Lymphocytes Relative: 13 %
Lymphs Abs: 1.6 10*3/uL (ref 0.7–4.0)
MCH: 28.4 pg (ref 26.0–34.0)
MCHC: 33.5 g/dL (ref 30.0–36.0)
MCV: 84.8 fL (ref 80.0–100.0)
Monocytes Absolute: 1.1 10*3/uL — ABNORMAL HIGH (ref 0.1–1.0)
Monocytes Relative: 9 %
Neutro Abs: 9.1 10*3/uL — ABNORMAL HIGH (ref 1.7–7.7)
Neutrophils Relative %: 77 %
Platelets: 122 10*3/uL — ABNORMAL LOW (ref 150–400)
RBC: 3.1 MIL/uL — ABNORMAL LOW (ref 3.87–5.11)
RDW: 16 % — ABNORMAL HIGH (ref 11.5–15.5)
WBC: 11.9 10*3/uL — ABNORMAL HIGH (ref 4.0–10.5)
nRBC: 0 % (ref 0.0–0.2)

## 2020-12-08 LAB — BASIC METABOLIC PANEL
Anion gap: 7 (ref 5–15)
BUN: <5 mg/dL — ABNORMAL LOW (ref 6–20)
CO2: 34 mmol/L — ABNORMAL HIGH (ref 22–32)
Calcium: 7.7 mg/dL — ABNORMAL LOW (ref 8.9–10.3)
Chloride: 96 mmol/L — ABNORMAL LOW (ref 98–111)
Creatinine, Ser: 0.34 mg/dL — ABNORMAL LOW (ref 0.44–1.00)
GFR, Estimated: >60 mL/min (ref >60)
Glucose, Bld: 151 mg/dL — ABNORMAL HIGH (ref 70–99)
Potassium: 3.3 mmol/L — ABNORMAL LOW (ref 3.5–5.1)
Sodium: 137 mmol/L (ref 135–145)

## 2020-12-08 LAB — GLUCOSE, CAPILLARY
Glucose-Capillary: 214 mg/dL — ABNORMAL HIGH (ref 70–99)
Glucose-Capillary: 153 mg/dL — ABNORMAL HIGH (ref 70–99)
Glucose-Capillary: 109 mg/dL — ABNORMAL HIGH (ref 70–99)
Glucose-Capillary: 151 mg/dL — ABNORMAL HIGH (ref 70–99)

## 2020-12-08 LAB — MAGNESIUM: Magnesium: 1.5 mg/dL — ABNORMAL LOW (ref 1.7–2.4)

## 2020-12-08 MED ORDER — MAGNESIUM SULFATE 2 GM/50ML IV SOLN
2.0000 g | Freq: Once | INTRAVENOUS | Status: AC
  Administered 2020-12-08: 2 g via INTRAVENOUS
  Filled 2020-12-08: qty 50

## 2020-12-08 MED ORDER — LORAZEPAM 2 MG/ML IJ SOLN
1.0000 mg | INTRAMUSCULAR | Status: DC | PRN
  Administered 2020-12-08 – 2020-12-10 (×5): 2 mg via INTRAVENOUS
  Filled 2020-12-08 (×5): qty 1

## 2020-12-08 MED ORDER — POTASSIUM CHLORIDE 10 MEQ/100ML IV SOLN
10.0000 meq | INTRAVENOUS | Status: AC
  Administered 2020-12-08 (×4): 10 meq via INTRAVENOUS
  Filled 2020-12-08 (×4): qty 100

## 2020-12-08 MED ORDER — ACETAMINOPHEN 325 MG PO TABS: 650.0000 mg | ORAL_TABLET | Freq: Once | ORAL | Status: DC

## 2020-12-08 MED ORDER — DEXMEDETOMIDINE HCL IN NACL 400 MCG/100ML IV SOLN
0.4000 ug/kg/h | INTRAVENOUS | Status: DC
  Administered 2020-12-08: 0.4 ug/kg/h via INTRAVENOUS
  Administered 2020-12-09: 0.6 ug/kg/h via INTRAVENOUS
  Administered 2020-12-10: 0.2 ug/kg/h via INTRAVENOUS
  Administered 2020-12-10: 0.4 ug/kg/h via INTRAVENOUS
  Filled 2020-12-08 (×4): qty 100

## 2020-12-08 MED ORDER — ACETAMINOPHEN 325 MG PO TABS
650.0000 mg | ORAL_TABLET | Freq: Once | ORAL | Status: AC
  Administered 2020-12-08: 650 mg via ORAL
  Filled 2020-12-08: qty 2

## 2020-12-08 MED ORDER — NICOTINE 21 MG/24HR TD PT24
21.0000 mg | MEDICATED_PATCH | Freq: Every day | TRANSDERMAL | Status: DC
  Administered 2020-12-08 – 2020-12-13 (×6): 21 mg via TRANSDERMAL
  Filled 2020-12-08 (×6): qty 1

## 2020-12-08 NOTE — Progress Notes (Signed)
Inpatient Diabetes Program Recommendations  AACE/ADA: New Consensus Statement on Inpatient Glycemic Control   Target Ranges:  Prepandial:   less than 140 mg/dL      Peak postprandial:   less than 180 mg/dL (1-2 hours)      Critically ill patients:  140 - 180 mg/dL   Results for [name], [name] (MRN [id number]) as of 12/08/2020 08:55  Ref. Range 12/07/2020 08:26 12/07/2020 11:54 12/07/2020 17:52 12/07/2020 21:41 12/08/2020 08:10  Glucose-Capillary Latest Ref Range: 70 - 99 mg/dL 546 (H) 270 (H) 350 (H) 188 (H) 214 (H)   Review of Glycemic Control  Diabetes history:DM1 (makes NO insulin; requires basal, correction, and meal coverage insulin) Outpatient Diabetes medications:Lantus 35 units daily, Novolog 1-15 units TID with meals (1 unit per 15 grams of carbs) Current orders for Inpatient glycemic control: Lantus 5 units daily, Novolog 0-9 units TID with meals, Novolog 0-5 units QHS; Solumedrol 40 mg daily   Inpatient Diabetes Program Recommendations:    Insulin: If steroids are continued, please consider ordering Novolog 3 units TID with meals for meal coverage if patient eats at least 50% of meals.  Thanks, Orlando Penner, RN, MSN, CDE Diabetes Coordinator Inpatient Diabetes Program [phone] (Team Pager from 8am to 5pm)

## 2020-12-08 NOTE — Progress Notes (Signed)
Pt has slid to bottom of bed and down onto her knees on floor mats x2. Pt is able to answer most orientation questions appropriately but continues to act out stating she wants to go smoke a cigarette or she wants to go to the store and frequently says "you can't keep me here. You can't treat me like this". Multiple attempts to re-direct and limit setting unsuccessful. PRN ativan not effective. Notified Dr. Kerry Hough. New orders received.

## 2020-12-08 NOTE — Progress Notes (Signed)
SLP Cancellation Note  Patient Details Name: Kilynn Fitzsimmons MRN: [id number] DOB: 09/27/1986   Cancelled treatment:       Reason Eval/Treat Not Completed: Fatigue/lethargy limiting ability to participate. ST will re-attempt as schedule permits. Thank you,  Bhavya Grand H. Romie Levee, CCC-SLP Speech Language Pathologist    Georgetta Haber 12/08/2020, 12:06 PM

## 2020-12-08 NOTE — Progress Notes (Signed)
PROGRESS NOTE    [name]  UUV:[id number] DOB: 1986-02-01 DOA: 11/30/2020 [facility]CP: [facility]    Brief Narrative:  Mrs. Montesinos was admitted to the hospital with the working diagnosis of acute hypoxemic respiratory failure due to SARS COVID 19 viral infection, complicated with bacterial pneumonia (parapneumonic effusion/ empyema) with MRSA bacteremia and septic shock.   35 yo female with past medical history of T1 DM, GERD, and cocaine use who presented with weakness and hypoxemia. Recently diagnosed with community acquired pneumonia about 2 days prior to admission, prescribed cefdinir and azithromycin. Her symptoms worsened with with progressive dyspnea, weakness, nausea and vomiting. EMS was called and her oxymetry was found to be 66% and glucose 600 mg/dl. She was transported to the ED. On her initial physical examination BP 110/79, HR 103, T 94.6 and oxymetry 86 to 89%. Lungs with no wheezing or rhonchi, heart S1-S2 present, abdomen soft and non tender, no lower extremity edema.   Sodium 123, potassium 3.1, chloride 82, bicarb 11, glucose 1623, creatinine 1.15, anion gap 30, magnesium 2.9, AST 246, ALT 299, total bilirubins 1.7. White cell count 5.3, hemoglobin 12.2, hematocrit 39.9, platelets 273. SARS COVID-19 positive. Urinalysis > 500 glucose, 0-5 white cells, specific gravity 1.022. Drug screen negative.  Chest radiograph with bilateral multilobar infiltrates, predominantly left lower lobe, right lower lobe, right upper lobe.  Platelike atelectasis left midlung.  EKG 101 bpm, rightward axis, QTC 500, sinus rhythm, Q-wave 83, aVF, V4-V6, no significant ST segment T wave changes.   Patient  was treated with systemic steroids and remdesivir.  She had high oxygen requirements, from heated high flow nasal cannula to non invasive mechanical ventilation and eventually to invasive mechanical ventilation 11/30/20   She did not received tocilizumab due to  concomitant bacterial infection.  Diagnosed with parapneumonic effusion/ empyema, bacterial pneumonia over infection (MRSA).  Her condition deteriorated into shock requiring vasopressors.   Liberated from mechanical ventilation on 12/03/2020, placed on high flow nasal cannula. CT surgery recommended not invasive intervention, since effusions had decreased in size.   Slowly decreasing oxygen requirements but patient very weak and deconditioned.    Assessment & Plan:   Principal Problem:   Severe MRSA sepsis with septic shock -secondary to community-acquired pneumonia with parapneumonic effusion/Empyema Active Problems:   Substance induced mood disorder (HCC)   Benzodiazepine abuse (HCC)   Substance abuse (HCC)   Tobacco abuse   Cocaine abuse (HCC)   DKA, type 1 (HCC)   Pneumonia due to COVID-19 virus   Acute Hypoxic respiratory failure due to COVID-19 (HCC)   Sepsis secondary to community-acquired pneumonia with parapneumonic effusion   CAP (community acquired pneumonia)--- with parapneumonic effusion/MRSA Empyema   Systolic CHF, acute ----EF 30 to 35 %   Rt Sided MRSA Empyema of pleura (HCC)    1. Acute hypoxemic respiratory failure due to SARS COVID 19 viral pneumonia, complicated with bacterial pneumonia over-infection (MRSA) with para-pneumonic effusion/ empyema. Septic shock.  Sp remdesivir #5  Pleural fluid culture positive for MRSA. Blood cultures with no growth.  Transthoracic echocardiogram with no vegetations.  (no tocilizumab or baricitinib due to concomitant bacterial infection)  Today oxygenation is 99% on 2 L/min per .  Wbc 11.9 today.   PCCM was following, discussed case with cardiothoracic surgery who did not recommend surgical invention at this time and favored prolonged antibiotic course. On methylprednisolone 40 mg IV daily, continue antibiotic therapy with IV vancomycin/ metronidazole.   Dr. Ella Jubilee consulted Dr Earlene Plater ID (phone)  for duration  of  antibiotic therapy, recommendations from 2 to 3 weeks of IV vancomycin or oral linezolid.   She is noted to not be a good candidate for IV antibiotics on discharge due to her history of substance abuse  Out of bed to chair tid with meals, PT and OT evaluation.   2. Acute systolic heart failure.  EF 30 -35% with akinetic, mid and distal anterior wall, mid and distal anterior septum, entire apex, mid and distal inferior wall and mid inferoseptal segment.   Started with furosemide 20 mg Iv for diuresis, her urine output over last 24 hrs has been 4,000 ml.  Chest x-ray from 2/22 shows persistent pulmonary edema and small bilateral pleural effusions.  Continue diuresis to target negative fluid balance.  Holding on b blocker and ace inh due to risk of hypotension.  Will request cardiology input since she has new cardiomyopathy and may need further work-up  3. Hyperkalemia/ hypokalemia/ hyponatremia. Stable renal function with serum cr at 0.34, K is 3,3 and bicarbonate 34,  Continue to replace potassium.  Continue diuresis with furosemide.  Follow up renal function in am, including Mg.   4. Acute metabolic encephalopathy (hx of substance abuse).   Patient was transiently on Precedex infusion, but was weaned off on 2/20 She has continued to have intermittent episodes of agitation/confusion where she is trying to get out of bed Lorazepam has not had any significant impact on her agitation Since it was felt that she would be a risk to herself, she has been started back on low-dose Precedex Continue with aspiration and fall precautions. On multivitamins and thiamine.  Continue out of bed to chair tid with meals as tolerated, PT and OT.  Follow with speech evaluation, for now continue with dysphagia 2 diet.   Gabapentin dose has been decreased PRN lorazepam for agitation.    5. T1DM with DKA.  Blood sugars are stable on Lantus and sliding scale insulin. Patient with very poor oral intake.    6. Acute anemia ad thrombocytopenia. Hgb 8.8 and hct 26.  She does not have any evidence of obvious bleeding.  Continue close monitoring of cbc.   7.  Generalized weakness Seen by physical therapy with recommendation for skilled nursing facility placement May also consider CIR since she is relatively young and will hopefully recover quickly Continue physical therapy while in hospital  Patient continue to be at high risk for worsening sepsis   Status is: Inpatient  Remains inpatient appropriate because:IV treatments appropriate due to intensity of illness or inability to take PO.  Major barrier to discharge at this point is persistent agitation and mental status changes.   Dispo: The patient is from: Home              Anticipated d/c is to: SNF              Anticipated d/c date is: > 3 days              Patient currently is not medically stable to d/c.   Difficult to place patient Yes   DVT prophylaxis: Enoxaparin   Code Status:   full  Family Communication:  No family at the bedside     Consultants:   PCCM  CT surgery   Procedures:  2/15 diagnostic thoracentesis right with removal of 16 ml   Subjective: Patient is very weak and deconditioned, no nausea or vomiting, no chest pain. Poor communication, dyspnea reports to be better.   Objective: Vitals:  12/08/20 1530 12/08/20 1600 12/08/20 1608 12/08/20 1700  BP:  120/85  133/84  Pulse: (!) 122 76 74 80  Resp: 18 (!) 21 (!) 23 20  Temp:   97.6 F (36.4 C)   TempSrc:   Oral   SpO2: 98% 100% 97% 98%  Weight:      Height:        Intake/Output Summary (Last 24 hours) at 12/08/2020 1834 Last data filed at 12/08/2020 1703 Gross per 24 hour  Intake 1178.17 ml  Output 6375 ml  Net -5196.83 ml   Filed Weights   12/02/20 0346 12/06/20 0400 12/07/20 0409  Weight: 55.5 kg 58.5 kg 51.1 kg    Examination:   General exam: Intermittently awake, no distress Respiratory system: Clear to auscultation. Respiratory  effort normal. Cardiovascular system:RRR. No murmurs, rubs, gallops. Gastrointestinal system: Abdomen is nondistended, soft and nontender. No organomegaly or masses felt. Normal bowel sounds heard. Central nervous system:No focal neurological deficits. Extremities: No C/C/E, +pedal pulses Skin: No rashes, lesions or ulcers Psychiatry: She knows she is in hospital and why she is in the hospital, she is still lethargic at times and try to get out of bed   Data Reviewed: I have personally reviewed following labs and imaging studies  CBC: Recent Labs  Lab 12/03/20 0459 12/04/20 0409 12/05/20 0416 12/06/20 1305 12/08/20 0448  WBC 5.8 13.3* 12.9* 18.8* 11.9*  NEUTROABS 4.1 10.7* 11.3* 17.8* 9.1*  HGB 10.5* 10.6* 10.9* 10.3* 8.8*  HCT 30.9* 31.9* 32.6* 31.0* 26.3*  MCV 86.3 87.6 86.2 87.1 84.8  PLT 113* 108* 99* 139* 122*   Basic Metabolic Panel: Recent Labs  Lab 12/02/20 0510 12/03/20 0459 12/04/20 0409 12/05/20 0416 12/06/20 0316 12/07/20 0456 12/08/20 0448  NA 139 141 143 141 140 143 137  K 4.6 5.2* 4.3 3.9 3.3* 3.3* 3.3*  CL 107 109 111 108 104 100 96*  CO2 26 27 29 30 24 30  34*  GLUCOSE 231* 259* 143* 164* 206* 203* 151*  BUN 30* 36* 27* 21* 15 9 <5*  CREATININE 0.76 0.67 0.51 0.32* 0.34* 0.46 0.34*  CALCIUM 6.9* 7.1* 7.2* 7.1* 7.3* 7.5* 7.7*  MG 2.1 2.2 2.0 1.7  --   --  1.5*   GFR: Estimated Creatinine Clearance: 69.8 mL/min (A) (by C-G formula based on SCr of 0.34 mg/dL (L)). Liver Function Tests: Recent Labs  Lab 12/02/20 0510 12/03/20 0459 12/04/20 0409 12/05/20 0416 12/06/20 0316  AST 62* 303* 57* 53* 61*  ALT 129* 181* 118* 104* 110*  ALKPHOS 459* 544* 399* 332* 321*  BILITOT 0.6 0.6 0.7 0.7 1.4*  PROT 4.8* 4.9* 4.6* 4.6* 5.1*  ALBUMIN 1.5* 1.6* 1.5* 1.6* 2.0*   No results for input(s): LIPASE, AMYLASE in the last 168 hours. No results for input(s): AMMONIA in the last 168 hours. Coagulation Profile: No results for input(s): INR, PROTIME in the  last 168 hours. Cardiac Enzymes: No results for input(s): CKTOTAL, CKMB, CKMBINDEX, TROPONINI in the last 168 hours. BNP (last 3 results) No results for input(s): PROBNP in the last 8760 hours. HbA1C: No results for input(s): HGBA1C in the last 72 hours. CBG: Recent Labs  Lab 12/07/20 1752 12/07/20 2141 12/08/20 0810 12/08/20 1145 12/08/20 1606  GLUCAP 270* 188* 214* 153* 109*   Lipid Profile: No results for input(s): CHOL, HDL, LDLCALC, TRIG, CHOLHDL, LDLDIRECT in the last 72 hours. Thyroid Function Tests: No results for input(s): TSH, T4TOTAL, FREET4, T3FREE, THYROIDAB in the last 72 hours. Anemia Panel: No results for  input(s): VITAMINB12, FOLATE, FERRITIN, TIBC, IRON, RETICCTPCT in the last 72 hours.    Radiology Studies: I have reviewed all of the imaging during this hospital visit personally     Scheduled Meds: . Chlorhexidine Gluconate Cloth  6 each Topical Daily  . enoxaparin (LOVENOX) injection  40 mg Subcutaneous Q24H  . feeding supplement (GLUCERNA SHAKE)  237 mL Oral QID  . furosemide  20 mg Intravenous Daily  . gabapentin  300 mg Oral TID  . insulin aspart  0-5 Units Subcutaneous QHS  . insulin aspart  0-9 Units Subcutaneous TID WC  . insulin glargine  5 Units Subcutaneous Daily  . methylPREDNISolone (SOLU-MEDROL) injection  40 mg Intravenous Daily  . multivitamin  15 mL Oral Daily  . nicotine  21 mg Transdermal Daily  . sodium chloride flush  10-40 mL Intracatheter Q12H  . thiamine injection  100 mg Intravenous BID   Continuous Infusions: . dexmedetomidine (PRECEDEX) IV infusion 0.4 mcg/kg/hr (12/08/20 1802)  . famotidine (PEPCID) IV Stopped (12/08/20 0851)  . metronidazole 500 mg (12/08/20 1212)  . vancomycin 200 mL/hr at 12/08/20 1703     LOS: 8 days        Erick Blinks, MD

## 2020-12-09 DIAGNOSIS — E1010 Type 1 diabetes mellitus with ketoacidosis without coma: Secondary | ICD-10-CM | POA: Diagnosis not present

## 2020-12-09 DIAGNOSIS — J869 Pyothorax without fistula: Secondary | ICD-10-CM | POA: Diagnosis not present

## 2020-12-09 DIAGNOSIS — F1410 Cocaine abuse, uncomplicated: Secondary | ICD-10-CM | POA: Diagnosis not present

## 2020-12-09 DIAGNOSIS — A419 Sepsis, unspecified organism: Secondary | ICD-10-CM | POA: Diagnosis not present

## 2020-12-09 DIAGNOSIS — J1282 Pneumonia due to coronavirus disease 2019: Secondary | ICD-10-CM | POA: Diagnosis not present

## 2020-12-09 DIAGNOSIS — A4102 Sepsis due to Methicillin resistant Staphylococcus aureus: Secondary | ICD-10-CM | POA: Diagnosis not present

## 2020-12-09 DIAGNOSIS — U071 COVID-19: Secondary | ICD-10-CM | POA: Diagnosis not present

## 2020-12-09 LAB — COMPREHENSIVE METABOLIC PANEL
ALT: 76 U/L — ABNORMAL HIGH (ref 0–44)
AST: 66 U/L — ABNORMAL HIGH (ref 15–41)
Albumin: 2 g/dL — ABNORMAL LOW (ref 3.5–5.0)
Alkaline Phosphatase: 273 U/L — ABNORMAL HIGH (ref 38–126)
Anion gap: 8 (ref 5–15)
BUN: 6 mg/dL (ref 6–20)
CO2: 33 mmol/L — ABNORMAL HIGH (ref 22–32)
Calcium: 7.9 mg/dL — ABNORMAL LOW (ref 8.9–10.3)
Chloride: 100 mmol/L (ref 98–111)
Creatinine, Ser: 0.34 mg/dL — ABNORMAL LOW (ref 0.44–1.00)
GFR, Estimated: >60 mL/min (ref >60)
Glucose, Bld: 179 mg/dL — ABNORMAL HIGH (ref 70–99)
Potassium: 3.7 mmol/L (ref 3.5–5.1)
Sodium: 141 mmol/L (ref 135–145)
Total Bilirubin: 0.8 mg/dL (ref 0.3–1.2)
Total Protein: 5.1 g/dL — ABNORMAL LOW (ref 6.5–8.1)

## 2020-12-09 LAB — CBC
HCT: 28.6 % — ABNORMAL LOW (ref 36.0–46.0)
Hemoglobin: 9.3 g/dL — ABNORMAL LOW (ref 12.0–15.0)
MCH: 28.5 pg (ref 26.0–34.0)
MCHC: 32.5 g/dL (ref 30.0–36.0)
MCV: 87.7 fL (ref 80.0–100.0)
Platelets: 133 10*3/uL — ABNORMAL LOW (ref 150–400)
RBC: 3.26 MIL/uL — ABNORMAL LOW (ref 3.87–5.11)
RDW: 16.1 % — ABNORMAL HIGH (ref 11.5–15.5)
WBC: 7.6 10*3/uL (ref 4.0–10.5)
nRBC: 0 % (ref 0.0–0.2)

## 2020-12-09 LAB — MAGNESIUM: Magnesium: 1.9 mg/dL (ref 1.7–2.4)

## 2020-12-09 LAB — GLUCOSE, CAPILLARY
Glucose-Capillary: 193 mg/dL — ABNORMAL HIGH (ref 70–99)
Glucose-Capillary: 182 mg/dL — ABNORMAL HIGH (ref 70–99)
Glucose-Capillary: 113 mg/dL — ABNORMAL HIGH (ref 70–99)
Glucose-Capillary: 160 mg/dL — ABNORMAL HIGH (ref 70–99)

## 2020-12-09 LAB — VANCOMYCIN, TROUGH: Vancomycin Tr: 14 ug/mL — ABNORMAL LOW (ref 15–20)

## 2020-12-09 NOTE — Progress Notes (Signed)
SLP Cancellation Note  Patient Details Name: [name] MRN: [id number] DOB: [birth year]   Cancelled treatment:       Reason Eval/Treat Not Completed: Fatigue/lethargy limiting ability to participate. Pt unfortunately continues to be not alert and consequently is inappropriate for PO trials; ST will continue efforts. Thank you,  Treyshawn Muldrew H. Romie Levee, CCC-SLP Speech Language Pathologist    Georgetta Haber 12/09/2020, 2:08 PM

## 2020-12-09 NOTE — Progress Notes (Signed)
PT Cancellation Note  Patient Details Name: Carlissa Pesola MRN: [id number] DOB: May 22, 1986   Cancelled Treatment:    Reason Eval/Treat Not Completed: Fatigue/lethargy limiting ability to participate    2:34 PM, 12/09/20 Ocie Bob, MPT Physical Therapist with [facility] [phone] office [phone] mobile phone

## 2020-12-09 NOTE — Progress Notes (Signed)
PROGRESS NOTE  [name] BMW:[id number] DOB: [birth year] DOA: 11/30/2020 [facility]  Brief History:  Mrs. [name] was admitted to the hospital with the working diagnosis of acute hypoxemic respiratory failure due to SARS COVID 19 viral infection, complicated with bacterial pneumonia(parapneumonic effusion/ empyema)with MRSA bacteremia and septic shock.   35 yo female with past medical history of T1 DM, GERD, and cocaine use who presented withweakness and hypoxemia. Recently diagnosed with community acquired pneumonia about 2 days prior to admission, prescribed cefdinir and azithromycin. Her symptoms worsened with with progressive dyspnea, weakness, nausea and vomiting. EMS was called and her oxymetry was found to be 66% and glucose 600 mg/dl. She was transported to the ED. On her initial physical examination BP 110/79, HR 103, T 94.6 and oxymetry 86 to 89%. Lungs with no wheezing or rhonchi, heart S1-S2 present, abdomen soft and non tender, no lower extremity edema.  Sodium 123, potassium 3.1, chloride 82, bicarb 11, glucose 1623, creatinine 1.15, anion gap 30, magnesium 2.9, AST 246, ALT 299, total bilirubins 1.7. White cell count 5.3, hemoglobin 12.2, hematocrit 39.9, platelets 273. SARS COVID-19 positive. Urinalysis>500 glucose, 0-5 white cells, specific gravity 1.022. Drug screen negative.  Chest radiograph with bilateral multilobar infiltrates, predominantly left lower lobe, right lower lobe, right upper lobe. Platelike atelectasis left midlung.  EKG 101 bpm, rightward axis, QTC 500, sinus rhythm, Q-wave 83, aVF, V4-V6, no significant ST segment T wave changes.  Patient was treated with systemic steroids and remdesivir.  She had high oxygen requirements, from heated high flow nasal cannula to non invasive mechanical ventilation and eventually to invasive mechanical ventilation 11/30/20   She did not received tocilizumab due to  concomitant bacterial infection.  Diagnosed with parapneumonic effusion/ empyema, bacterial pneumonia over infection (MRSA).  Her condition deteriorated into shock requiring vasopressors.   Liberated from mechanical ventilation on 12/03/2020, placed on high flow nasal cannula. CT surgery recommended not invasive intervention, since effusions had decreased in size.   Slowly decreasing oxygen requirements but patient very weak and deconditioned.  Barrier to d/c remains to be confusion, agitation and interference with medical therapy.  Precedex restarted on 12/08/20   Assessment/Plan: Acute hypoxemic respiratory failure due to SARS COVID 19 viral pneumonia, complicated with bacterial pneumonia over-infection (MRSA) with para-pneumonic effusion/ empyema. Septic shock.  Sp remdesivir #5  Pleural fluid culture positive for MRSA. Blood cultures with no growth.  Transthoracic echocardiogram with no vegetations.  (no tocilizumab or baricitinib due to concomitant bacterial infection) -stable on 2L -WBC improving  PCCM was following, discussed case with cardiothoracic surgery who did not recommend surgical invention at this time and favored prolonged antibiotic course. On methylprednisolone 40 mg IV daily, continue antibiotic therapy with IV vancomycin/ metronidazole. Dr. Ella JubileeArrien consulted Dr Earlene PlaterWallace ID (phone)  for duration of antibiotic therapy, recommendations from 2 to 3 weeks of IV vancomycin or oral linezolid.   She is noted to not be a good candidate for IV antibiotics on discharge due to her history of substance abuse  Out of bed to chair tid with meals, PT and OT evaluation.   Acute systolic heart failure. EF 30 -35% with akinetic, mid and distal anterior wall, mid and distal anterior septum, entire apex, mid and distal inferior wall and mid inferoseptal segment.   -Continue furosemide 20 mg Iv for diuresis, her urine output over last 24 hrs has been 4,000 ml.  -personally  reviewed Chest x-ray from 2/22 shows persistent pulmonary  edema and small bilateral pleural effusions.   -Continue diuresis to target negative fluid balance.  -Holding on b blocker and acei due to soft BPs -will ultimately need outpt cards work up  Hyperkalemia/ hypokalemia/ hyponatremia.  -Na improving with diuresis -K repleted -mag 1.9  Acute metabolic encephalopathy (hx of substance abuse).   Patient was transiently on Precedex infusion, but was weaned off on 2/20 She has continued to have intermittent episodes of agitation/confusion where she is trying to get out of bed Lorazepam has not had any significant impact on her agitation Since it was felt that she would be a risk to herself, she has been started back on low-dose Precedex on 12/08/20 Continue with aspiration and fall precautions. On multivitamins and thiamine.  Continue out of bed to chair tid with meals as tolerated, PT and OT.  Follow with speech evaluation, for now continue with dysphagia 2 diet.   Gabapentin dose has been decreased PRN lorazepam for agitation.   -12/09/20--wean precedex  Uncontrolled T1DM with DKA.  Blood sugars are stable on Lantus and sliding scale insulin. Patient with very poor oral intake.   Acute anemia ad thrombocytopenia. Hgb 8.8 and hct 26.  She does not have any evidence of obvious bleeding.  Continue close monitoring of cbc.  Generalized weakness Seen by physical therapy with recommendation for skilled nursing facility placement May also consider CIR since she is relatively young and will hopefully recover quickly Continue physical therapy while in hospital  Patient continue to be at high risk for worsening sepsis      Status is: Inpatient  Remains inpatient appropriate because:IV treatments appropriate due to intensity of illness or inability to take PO   Dispo: The patient is from: Home              Anticipated d/c is to: SNF              Anticipated d/c date is: 3  days              Patient currently Not medically stable for d/c   Difficult to place patient Yes        Family Communication: no  Family at bedside  Consultants:  PCCM  Code Status:  FULL  DVT Prophylaxis:  [place] Heparin / [place] Lovenox   Procedures: As Listed in Progress Note Above  Antibiotics: [place] 2/15>>      Subjective: Patient awake but somnolent on precedex.  Denies cp, sob.  Remainder ROS unobtainable  Objective: Vitals:   12/09/20 0600 12/09/20 0700 12/09/20 0800 12/09/20 0900  BP: (!) 160/93 (!) 164/86 (!) 144/82 120/76  Pulse: 61 (!) 50 (!) 52 (!) 50  Resp: (!) 25 17 16 17   Temp:      TempSrc:      SpO2: 93% 100% 100% 99%  Weight:      Height:        Intake/Output Summary (Last 24 hours) at 12/09/2020 1124 Last data filed at 12/09/2020 0931 Gross per 24 hour  Intake 1253.71 ml  Output 5000 ml  Net -3746.29 ml   Weight change:  Exam:   General:  Pt is alert,does not follow commands appropriately, not in acute distress  HEENT: No icterus, No thrush, No neck mass, [place]/AT  Cardiovascular: RRR, S1/S2, no rubs, no gallops  Respiratory: bilateral rales. No wheeze  Abdomen: Soft/+BS, non tender, non distended, no guarding  Extremities: No edema, No lymphangitis, No petechiae, No rashes, no synovitis   Data Reviewed: I have  personally reviewed following labs and imaging studies Basic Metabolic Panel: Recent Labs  Lab 12/03/20 0459 12/04/20 0409 12/05/20 0416 12/06/20 0316 12/07/20 0456 12/08/20 0448 12/09/20 0524  NA 141 143 141 140 143 137 141  K 5.2* 4.3 3.9 3.3* 3.3* 3.3* 3.7  CL 109 111 108 104 100 96* 100  CO2 27 29 30 24 30  34* 33*  GLUCOSE 259* 143* 164* 206* 203* 151* 179*  BUN 36* 27* 21* 15 9 <5* 6  CREATININE 0.67 0.51 0.32* 0.34* 0.46 0.34* 0.34*  CALCIUM 7.1* 7.2* 7.1* 7.3* 7.5* 7.7* 7.9*  MG 2.2 2.0 1.7  --   --  1.5* 1.9   Liver Function Tests: Recent Labs  Lab 12/03/20 0459 12/04/20 0409 12/05/20 0416  12/06/20 0316 12/09/20 0524  AST 303* 57* 53* 61* 66*  ALT 181* 118* 104* 110* 76*  ALKPHOS 544* 399* 332* 321* 273*  BILITOT 0.6 0.7 0.7 1.4* 0.8  PROT 4.9* 4.6* 4.6* 5.1* 5.1*  ALBUMIN 1.6* 1.5* 1.6* 2.0* 2.0*   No results for input(s): LIPASE, AMYLASE in the last 168 hours. No results for input(s): AMMONIA in the last 168 hours. Coagulation Profile: No results for input(s): INR, PROTIME in the last 168 hours. CBC: Recent Labs  Lab 12/03/20 0459 12/04/20 0409 12/05/20 0416 12/06/20 1305 12/08/20 0448 12/09/20 0524  WBC 5.8 13.3* 12.9* 18.8* 11.9* 7.6  NEUTROABS 4.1 10.7* 11.3* 17.8* 9.1*  --   HGB 10.5* 10.6* 10.9* 10.3* 8.8* 9.3*  HCT 30.9* 31.9* 32.6* 31.0* 26.3* 28.6*  MCV 86.3 87.6 86.2 87.1 84.8 87.7  PLT 113* 108* 99* 139* 122* 133*   Cardiac Enzymes: No results for input(s): CKTOTAL, CKMB, CKMBINDEX, TROPONINI in the last 168 hours. BNP: Invalid input(s): POCBNP CBG: Recent Labs  Lab 12/08/20 0810 12/08/20 1145 12/08/20 1606 12/08/20 2121 12/09/20 0922  GLUCAP 214* 153* 109* 151* 193*   HbA1C: No results for input(s): HGBA1C in the last 72 hours. Urine analysis:    Component Value Date/Time   COLORURINE STRAW (A) 11/30/2020 0949   APPEARANCEUR CLEAR 11/30/2020 0949   LABSPEC 1.022 11/30/2020 0949   PHURINE 5.0 11/30/2020 0949   GLUCOSEU >=500 (A) 11/30/2020 0949   HGBUR MODERATE (A) 11/30/2020 0949   BILIRUBINUR NEGATIVE 11/30/2020 0949   KETONESUR 20 (A) 11/30/2020 0949   PROTEINUR NEGATIVE 11/30/2020 0949   UROBILINOGEN 1.0 07/28/2015 1402   NITRITE NEGATIVE 11/30/2020 0949   LEUKOCYTESUR NEGATIVE 11/30/2020 0949   Sepsis Labs: @LABRCNTIP (procalcitonin:4,lacticidven:4) ) Recent Results (from the past 240 hour(s))  Blood Culture (routine x 2)     Status: None   Collection Time: 11/30/20  8:49 AM   Specimen: BLOOD LEFT ARM  Result Value Ref Range Status   Specimen Description BLOOD LEFT ARM DRAWN BY RN  Final   Special Requests   Final     BOTTLES DRAWN AEROBIC AND ANAEROBIC Blood Culture results may not be optimal due to an inadequate volume of blood received in culture bottles   Culture   Final    NO GROWTH 5 DAYS Performed at [facility], [address], [place], [address] [place]    Report Status 12/05/2020 FINAL  Final  Resp Panel by RT-PCR (Flu A&B, Covid) Nasopharyngeal Swab     Status: Abnormal   Collection Time: 11/30/20  8:56 AM   Specimen: Nasopharyngeal Swab; Nasopharyngeal(NP) swabs in vial transport medium  Result Value Ref Range Status   SARS Coronavirus 2 by RT PCR POSITIVE (A) NEGATIVE Final    Comment: RESULT  CALLED TO, READ BACK BY AND VERIFIED WITH: CRAWFORD H. AT 1049AM ON [id number] BY THOMPSON S. (NOTE) SARS-CoV-2 target nucleic acids are DETECTED.  The SARS-CoV-2 RNA is generally detectable in upper respiratory specimens during the acute phase of infection. Positive results are indicative of the presence of the identified virus, but do not rule out bacterial infection or co-infection with other pathogens not detected by the test. Clinical correlation with patient history and other diagnostic information is necessary to determine patient infection status. The expected result is Negative.  Fact Sheet for Patients: BloggerCourse.com  Fact Sheet for Healthcare Providers: SeriousBroker.it  This test is not yet approved or cleared by the [place] FDA and  has been authorized for detection and/or diagnosis of SARS-CoV-2 by FDA under an Emergency Use Authorization (EUA).  This EUA will remain in effect (meaning thi s test can be used) for the duration of  the COVID-19 declaration under Section 564(b)(1) of the Act, 21 U.S.C. section 360bbb-3(b)(1), unless the authorization is terminated or revoked sooner.     Influenza A by PCR NEGATIVE NEGATIVE Final   Influenza B by PCR NEGATIVE NEGATIVE Final    Comment: (NOTE) The Xpert Xpress  SARS-CoV-2/FLU/RSV plus assay is intended as an aid in the diagnosis of influenza from Nasopharyngeal swab specimens and should not be used as a sole basis for treatment. Nasal washings and aspirates are unacceptable for Xpert Xpress SARS-CoV-2/FLU/RSV testing.  Fact Sheet for Patients: BloggerCourse.com  Fact Sheet for Healthcare Providers: SeriousBroker.it  This test is not yet approved or cleared by the [place] FDA and has been authorized for detection and/or diagnosis of SARS-CoV-2 by FDA under an Emergency Use Authorization (EUA). This EUA will remain in effect (meaning this test can be used) for the duration of the COVID-19 declaration under Section 564(b)(1) of the Act, 21 U.S.C. section 360bbb-3(b)(1), unless the authorization is terminated or revoked.  Performed at [facility], [address], [place], [place] [address]   Blood Culture (routine x 2)     Status: None   Collection Time: 11/30/20  9:21 AM   Specimen: BLOOD LEFT FOREARM  Result Value Ref Range Status   Specimen Description BLOOD LEFT FOREARM  Final   Special Requests   Final    BOTTLES DRAWN AEROBIC AND ANAEROBIC Blood Culture adequate volume   Culture   Final    NO GROWTH 5 DAYS Performed at [facility], [address], [place], [place] [address]    Report Status 12/05/2020 FINAL  Final  MRSA PCR Screening     Status: Abnormal   Collection Time: 11/30/20 12:30 PM   Specimen: Nasopharyngeal  Result Value Ref Range Status   MRSA by PCR POSITIVE (A) NEGATIVE Final    Comment:        The GeneXpert MRSA Assay (FDA approved for NASAL specimens only), is one component of a comprehensive MRSA colonization surveillance program. It is not intended to diagnose MRSA infection nor to guide or monitor treatment for MRSA infections. RESULT CALLED TO, READ BACK BY AND VERIFIED WITH: ASHLEY SHELTON 2/14 @ 1631 BY Chauncey Mann Performed at [facility],  [address] [place], [place] [address]   Gram stain     Status: None   Collection Time: 12/01/20  2:02 PM   Specimen: Pleura  Result Value Ref Range Status   Specimen Description PLEURAL  Final   Special Requests NONE  Final   Gram Stain   Final    CYTOSPIN SMEAR NO ORGANISMS  SEEN WBC PRESENT, PREDOMINANTLY PMN Performed at [facility], [address] [place], [place] [address]    Report Status 12/01/2020 FINAL  Final  Body fluid culture w Gram Stain     Status: None   Collection Time: 12/01/20  2:04 PM   Specimen: Pleura; Body Fluid  Result Value Ref Range Status   Specimen Description   Final    PLEURAL Performed at [facility], [address] [place], [place] [address]    Special Requests   Final    Normal Performed at [facility], [address], [place], [place] [address]    Gram Stain   Final    MODERATE WBC PRESENT, PREDOMINANTLY PMN NO ORGANISMS SEEN    Culture   Final    RARE METHICILLIN RESISTANT STAPHYLOCOCCUS AUREUS CRITICAL RESULT CALLED TO, READ BACK BY AND VERIFIED WITH: RN A.LARIMORE AT 1000 ON 12/03/2020 BY T.SAAD Performed at [facility], [address], [place], [place] [address]    Report Status 12/04/2020 FINAL  Final   Organism ID, Bacteria METHICILLIN RESISTANT STAPHYLOCOCCUS AUREUS  Final      Susceptibility   Methicillin resistant staphylococcus aureus - MIC*    CIPROFLOXACIN >=8 RESISTANT Resistant     ERYTHROMYCIN >=8 RESISTANT Resistant     GENTAMICIN <=0.5 SENSITIVE Sensitive     OXACILLIN >=4 RESISTANT Resistant     TETRACYCLINE <=1 SENSITIVE Sensitive     VANCOMYCIN 1 SENSITIVE Sensitive     TRIMETH/SULFA <=10 SENSITIVE Sensitive     CLINDAMYCIN <=0.25 SENSITIVE Sensitive     RIFAMPIN <=0.5 SENSITIVE Sensitive     Inducible Clindamycin NEGATIVE Sensitive     * RARE METHICILLIN RESISTANT STAPHYLOCOCCUS AUREUS     Scheduled Meds: . Chlorhexidine Gluconate Cloth  6 each Topical Daily  . enoxaparin (LOVENOX) injection  40 mg  Subcutaneous Q24H  . feeding supplement (GLUCERNA SHAKE)  237 mL Oral QID  . furosemide  20 mg Intravenous Daily  . gabapentin  300 mg Oral TID  . insulin aspart  0-5 Units Subcutaneous QHS  . insulin aspart  0-9 Units Subcutaneous TID WC  . insulin glargine  5 Units Subcutaneous Daily  . methylPREDNISolone (SOLU-MEDROL) injection  40 mg Intravenous Daily  . multivitamin  15 mL Oral Daily  . nicotine  21 mg Transdermal Daily  . sodium chloride flush  10-40 mL Intracatheter Q12H  . thiamine injection  100 mg Intravenous BID   Continuous Infusions: . dexmedetomidine (PRECEDEX) IV infusion 0.6 mcg/kg/hr (12/09/20 0818)  . famotidine (PEPCID) IV 20 mg (12/09/20 0931)  . metronidazole 500 mg (12/09/20 0519)  . vancomycin Stopped (12/09/20 0732)    Procedures/Studies: CT CHEST WO CONTRAST  Result Date: 12/03/2020 CLINICAL DATA:  COVID pneumonia. EXAM: CT CHEST WITHOUT CONTRAST TECHNIQUE: Multidetector CT imaging of the chest was performed following the standard protocol without IV contrast. COMPARISON:  Chest CT 05/18/2017 FINDINGS: Cardiovascular: The heart is normal in size. No pericardial effusion. The endotracheal tube is in good position just above the carina. There is an NG tube coursing down the esophagus and into the stomach. The right IJ central venous catheter is in good position. Mediastinum/Nodes: Borderline enlarged mediastinal and hilar lymph nodes likely reactive/inflammatory. Lungs/Pleura: Underlying emphysematous changes are noted with superimposed severe interstitial process in the upper lobes with areas of cystic change which could be bronchiectasis or pneumatocele see. There is fairly dense airspace consolidation in both lower lobes with suspected areas of mild cavitation most notably in the right lower lobe  medially. There are also small bilateral pleural effusions, right larger than left. No pneumothorax or pneumomediastinum. Upper Abdomen: Upper abdominal ascites is noted with  fluid around the gallbladder, in the hepato renal fossa and pericolic gutters. Musculoskeletal: No significant bony findings. IMPRESSION: 1. Underlying emphysematous changes with superimposed severe interstitial process in the upper lobes with areas of cystic change which could be bronchiectasis or pneumatoceles. 2. Dense airspace consolidation in both lower lobes with suspected areas of mild cavitation most notably in the right lower lobe medially. 3. Small bilateral pleural effusions, right larger than left. 4. Upper abdominal ascites. 5. Lines and tubes in good position without complicating features. Emphysema (ICD10-J43.9). Electronically Signed   By: Rudie Meyer M.D.   On: 12/03/2020 13:20   CT ABDOMEN PELVIS W CONTRAST  Result Date: 11/28/2020 CLINICAL DATA:  Abdominal pain, biliary obstruction suspected. EXAM: CT ABDOMEN AND PELVIS WITH CONTRAST TECHNIQUE: Multidetector CT imaging of the abdomen and pelvis was performed using the standard protocol following bolus administration of intravenous contrast. CONTRAST:  OMNIPAQUE IOHEXOL 300 MG/ML  SOLN COMPARISON:  CT abdomen pelvis 04/10/2017, CT angio chest 05/18/2017 FINDINGS: Lower chest: Partially visualized trace right pleural effusion. Associated passive atelectasis of the right lower lobe. Dependent right lower lobe consolidation. Masslike right upper and right lower lobe consolidations (4:3, 5:33). Hepatobiliary: The liver measures at upper limits of normal in size. No focal liver abnormality. No gallstones, gallbladder wall thickening, or pericholecystic fluid. No biliary dilatation. Pancreas: No focal lesion. Normal pancreatic contour. No surrounding inflammatory changes. No main pancreatic ductal dilatation. Spleen: Normal in size without focal abnormality. Adrenals/Urinary Tract: No adrenal nodule bilaterally. Bilateral kidneys enhance symmetrically. No hydronephrosis. No hydroureter. The urinary bladder is unremarkable. Stomach/Bowel:  Fluid level within the gastric lumen likely related to ingested material. Stomach is within normal limits. Prominent but not dilated loop of small bowel within the lower anterior abdomen with no definite transition point. No pneumatosis. No evidence of bowel wall thickening or dilatation. Appendix appears normal. Vascular/Lymphatic: No abdominal aorta or iliac aneurysm. 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 No abdominal, pelvic, or inguinal lymphadenopathy. 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 Uterus and bilateral adnexa are unremarkable. Other: Trace simple free fluid within the pelvis that could be physiologic in etiology. No intraperitoneal free gas. No organized fluid collection. Musculoskeletal: No acute or significant osseous findings. IMPRESSION: 1. Dependent right lower lobe consolidation. Masslike right upper and right lower lobe consolidation. Findings likely represents infection/inflammation. Recommend repeat chest x-ray PA and lateral in 3-4 weeks to exclude underlying malignancy. 2. Trace volume right pleural effusion. 3. 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 Electronically Signed   By: Tish Frederickson M.D.   On: 11/28/2020 01:35   DG Chest Port 1 View  Result Date: 12/07/2020 CLINICAL DATA:  Dyspnea. EXAM: PORTABLE CHEST 1 VIEW COMPARISON:  December 05, 2020. FINDINGS: Improved diffuse interstitial opacities with slight worsening of dense left basilar consolidation. Small bilateral pleural effusions, likely slightly increased on the right. No visible pneumothorax. Cardiomediastinal silhouette is within normal limits. IMPRESSION: 1. Improved diffuse interstitial opacities with slight worsening of dense left basilar consolidation. 2. Small bilateral pleural effusions, likely slightly increased on the right. Electronically Signed   By: Feliberto Harts MD   On: 12/07/2020 13:42   DG Chest Port 1  View  Result Date: 12/05/2020 CLINICAL DATA:  COVID positive with severe MRSA septic shock EXAM: PORTABLE CHEST 1 VIEW COMPARISON:  12/02/2020 FINDINGS: Right arm PICC line tip is at the cavoatrial junction. Interval removal of NG tube and ET tube. Heart size is normal. Bilateral scratch set small bilateral pleural effusions are again noted. Diffuse interstitial and airspace densities appear unchanged from previous exam. IMPRESSION: 1. Persistent bilateral pleural effusions and bilateral interstitial and airspace opacities compatible with multifocal atypical viral pneumonia. 2. Stable appearance of the right arm PICC line. Electronically Signed   By: Signa Kell M.D.   On: 12/05/2020 07:01   DG CHEST PORT 1 VIEW  Result Date: 12/02/2020 CLINICAL DATA:  Intubated, COVID EXAM: PORTABLE CHEST 1 VIEW COMPARISON:  12/01/2020 FINDINGS: Endotracheal tube is 3 cm above the carina. NG tube is in the stomach. Layering bilateral pleural effusions. Interstitial prominence throughout the lungs. Bilateral lower lobe airspace disease. No real change since prior study. IMPRESSION: Persistent interstitial prominence and pulmonary infiltrates. Layering bilateral effusions. No change. Electronically Signed   By: Charlett Nose M.D.   On: 12/02/2020 06:33   DG Chest Portable 1 View  Result Date: 12/01/2020 CLINICAL DATA:  RIGHT pleural effusion, COVID-19, sepsis EXAM: PORTABLE CHEST 1 VIEW COMPARISON:  Portable exam at 1345 hrs compared to 0921 hrs FINDINGS: Tip of endotracheal tube projects 1.9 cm above carina. NG tube extends into stomach. Stable heart size and mediastinal contours normal. Persistent diffuse pulmonary infiltrates unchanged. Persistent small RIGHT pleural effusion. No pneumothorax following RIGHT thoracentesis. IMPRESSION: No pneumothorax following diagnostic RIGHT thoracentesis. Persistent pulmonary infiltrates and small RIGHT pleural effusion. Electronically Signed   By: Ulyses Southward M.D.   On: 12/01/2020  14:20   DG CHEST PORT 1 VIEW  Result Date: 12/01/2020 CLINICAL DATA:  35 year old ventilated female status post central line placement. EXAM: PORTABLE CHEST 1 VIEW COMPARISON:  Chest x-ray 11/30/2020. FINDINGS: An endotracheal tube is in place with tip 3.3 cm above the carina. There is a right-sided internal jugular central venous catheter with tip terminating in the distal superior vena cava. Widespread areas of ill-defined opacities and areas of interstitial prominence are noted throughout the lungs bilaterally, most evident throughout the mid to lower lungs, minimally improved compared to the prior examination. Moderate right and small left pleural effusions. No pneumothorax. Pulmonary vasculature is largely obscured. Heart size is normal. The patient is rotated to the left on today's exam, resulting in distortion of the mediastinal contours and reduced diagnostic sensitivity and specificity for mediastinal pathology. IMPRESSION: 1. Support apparatus, as above. 2. The appearance of the lungs is most compatible with severe multilobar bilateral pneumonia related to known COVID infection. Overall, aeration has slightly improved compared to the prior study. 3. Moderate right and small left pleural effusions similar to the prior examination. Electronically Signed   By: Trudie Reed M.D.   On: 12/01/2020 09:37   DG CHEST PORT 1 VIEW  Result Date: 11/30/2020 CLINICAL DATA:  COVID-19 positive, tobacco abuse, intubated EXAM: PORTABLE CHEST 1 VIEW COMPARISON:  11/30/2020, 11/28/2020 FINDINGS: Single frontal view of the chest demonstrates interval placement of endotracheal tube overlying tracheal air column, tip 1.7 cm above carina. Enteric catheter passes below diaphragm tip excluded by collimation, side port projecting over the gastric body. Multifocal bilateral ground-glass airspace disease has progressed since prior study, which could reflect progressive pneumonia or superimposed edema. Stable right pleural  effusion. No pneumothorax. IMPRESSION: 1. Support devices as above. 2. Progressive diffuse ground-glass airspace disease, which may reflect worsening pneumonia or superimposed edema. 3. Stable right pleural effusion. Electronically Signed   By: Maxwell Caul.D.  On: 11/30/2020 20:07   DG Chest Port 1 View  Result Date: 11/30/2020 CLINICAL DATA:  Shortness of breath, recent pneumonia in a 35 year old female EXAM: PORTABLE CHEST 1 VIEW COMPARISON:  November 28, 2020 FINDINGS: Interstitial and airspace disease worse than on the prior study, superimposed on a background of pulmonary emphysema. Interval development of a small RIGHT-sided pleural effusion. Trachea midline. Cardiomediastinal contours and hilar structures are stable. Nodular component to the airspace process with focal areas of nodularity the for example in the RIGHT upper and LEFT lower lobe. No acute skeletal process on limited assessment. IMPRESSION: 1. Worsening interstitial and airspace disease superimposed on a background of pulmonary emphysema. Findings suspicious for multifocal pneumonia. Given nodular component would suggest follow-up to ensure resolution and exclude underlying pulmonary nodules. 2. New small RIGHT-sided pleural effusion. Could also consider the possibility of an overlay of heart failure. Electronically Signed   By: Donzetta Kohut M.D.   On: 11/30/2020 09:21   DG Chest Port 1 View  Result Date: 11/28/2020 CLINICAL DATA:  Nausea, vomiting, fever and cough. EXAM: PORTABLE CHEST 1 VIEW COMPARISON:  04/06/2020 FINDINGS: Artifact overlies the chest. Heart size is normal. There are patchy pulmonary infiltrates in both lungs, more extensive on the right than the left, consistent with widespread bronchopneumonia. This could be bacterial or viral. No lobar consolidation or collapse. No pleural effusion. No significant bone finding. IMPRESSION: Widespread bilateral bronchopneumonia, right more extensive than left. This could be  bacterial or viral. Electronically Signed   By: Paulina Fusi M.D.   On: 11/28/2020 00:26   ECHOCARDIOGRAM COMPLETE  Result Date: 12/03/2020    ECHOCARDIOGRAM REPORT   Patient Name:   MARGARETA LAUREANO Date of Exam: 12/03/2020 Medical Rec #:  [id number]         Height: Accession #:    [id number]        Weight: Date of Birth:  [birth year]         BSA: Patient Age:    34 years          BP:           120/89 mmHg Patient Gender: F                 HR:           77 bpm. [facility] Procedure: 2D Echo Indications:    Endocarditis I38  History:        Patient has prior history of Echocardiogram examinations, most                 recent 03/09/2019. Signs/Symptoms:Murmur; Risk Factors:Current                 Smoker and Diabetes. Substance induced mood disorder ,                 Pneumonia, Cocaine Abuse.  Sonographer:    Jeryl Columbia RDCS (AE) Referring Phys: [id number] RAKESH V ALVA IMPRESSIONS  1. Left ventricular ejection fraction, by estimation, is 30 to 35%. The left ventricle has moderately decreased function. The left ventricle demonstrates regional wall motion abnormalities (see scoring diagram/findings for description). Left ventricular  diastolic parameters were normal.  2. Right ventricular systolic function is moderately reduced. The right ventricular size is normal. There is normal pulmonary artery systolic pressure. The estimated right ventricular systolic pressure is 22.0 mmHg.  3. The mitral valve is grossly normal. Mild to moderate mitral valve regurgitation.  4. The aortic valve is tricuspid. Aortic valve  regurgitation is not visualized.  5. The inferior vena cava is normal in size with greater than 50% respiratory variability, suggesting right atrial pressure of 3 mmHg.  6. No obvious valvular vegetations. FINDINGS  Left Ventricle: Left ventricular ejection fraction, by estimation, is 30 to 35%. The left ventricle has moderately decreased function. The left ventricle demonstrates regional wall  motion abnormalities. The left ventricular internal cavity size was normal in size. There is no left ventricular hypertrophy. Left ventricular diastolic parameters were normal.  LV Wall Scoring: The mid and distal anterior wall, mid and distal anterior septum, entire apex, mid and distal inferior wall, and mid inferoseptal segment are akinetic. The basal anteroseptal segment, mid inferolateral segment, and mid anterolateral segment are hypokinetic. The basal inferolateral segment, basal anterolateral segment, basal anterior segment, basal inferior segment, and basal inferoseptal segment are normal. Right Ventricle: The right ventricular size is normal. No increase in right ventricular wall thickness. Right ventricular systolic function is moderately reduced. There is normal pulmonary artery systolic pressure. The tricuspid regurgitant velocity is 2.18 m/s, and with an assumed right atrial pressure of 3 mmHg, the estimated right ventricular systolic pressure is 22.0 mmHg. Left Atrium: Left atrial size was normal in size. Right Atrium: Right atrial size was normal in size. Pericardium: There is no evidence of pericardial effusion. Mitral Valve: The mitral valve is grossly normal. Mild to moderate mitral valve regurgitation, with eccentric posteriorly directed jet. Tricuspid Valve: The tricuspid valve is grossly normal. Tricuspid valve regurgitation is mild. Aortic Valve: The aortic valve is tricuspid. Aortic valve regurgitation is not visualized. Pulmonic Valve: The pulmonic valve was grossly normal. Pulmonic valve regurgitation is not visualized. Aorta: The aortic root is normal in size and structure. Venous: The inferior vena cava is normal in size with greater than 50% respiratory variability, suggesting right atrial pressure of 3 mmHg. IAS/Shunts: No atrial level shunt detected by color flow Doppler.  LEFT VENTRICLE PLAX 2D LVIDd:         3.72 cm  Diastology LVIDs:         3.15 cm  LV e' medial:    6.53 cm/s LV  PW:         1.00 cm  LV E/e' medial:  9.7 LV IVS:        0.80 cm  LV e' lateral:   6.42 cm/s LVOT diam:     1.80 cm  LV E/e' lateral: 9.9 LVOT Area:     2.54 cm  RIGHT VENTRICLE RV S prime:     7.29 cm/s LEFT ATRIUM             RIGHT ATRIUM LA diam:        2.20 cm RA Area:     9.12 cm LA Vol (A2C):   24.3 ml RA Volume:   20.90 ml LA Vol (A4C):   28.3 ml LA Biplane Vol: 27.0 ml   AORTA Ao Root diam: 2.40 cm MITRAL VALVE               TRICUSPID VALVE MV Area (PHT): 4.21 cm    TR Peak grad:   19.0 mmHg MV Decel Time: 180 msec    TR Vmax:        218.00 cm/s MV E velocity: 63.40 cm/s MV A velocity: 70.70 cm/s  SHUNTS MV E/A ratio:  0.90        Systemic Diam: 1.80 cm Nona Dell MD Electronically signed by Nona Dell MD Signature Date/Time: 12/03/2020/4:11:56 PM  Final    [place] EKG SITE RITE  Result Date: 12/04/2020 If Site Rite image not attached, placement could not be confirmed due to current cardiac rhythm.  US Abdomen Limited RUQ (LIVER/GB)  Result Date: 11/30/2020 CLINICAL DATA:  Right upper quadrant pain and elevated LFTs EXAM: ULTRASOUND ABDOMEN LIMITED RIGHT UPPER QUADRANT COMPARISON:  None. FINDINGS: Gallbladder: Gallbladder is well distended. Mild gallbladder sludge is noted. No gallstones are seen. Common bile duct: Diameter: 3.2 mm. Liver: Mild increased echogenicity consistent with fatty infiltration. No mass lesion is noted. Portal vein is patent on color Doppler imaging with normal direction of blood flow towards the liver. Other: None. IMPRESSION: Fatty infiltration of the liver. Gallbladder sludge.  No cholelithiasis or wall thickening is noted. Electronically Signed   By: Alcide Clever M.D.   On: 11/30/2020 12:50   US THORACENTESIS ASP PLEURAL SPACE W/IMG GUIDE  Result Date: 12/01/2020 INDICATION: COVID-19 pneumonia, sepsis, small RIGHT pleural effusion EXAM: ULTRASOUND GUIDED DIAGNOSTIC RIGHT THORACENTESIS MEDICATIONS: None. COMPLICATIONS: None immediate. PROCEDURE: An ultrasound  guided thoracentesis was thoroughly discussed with the patient and questions answered. The benefits, risks, alternatives and complications were also discussed. The patient understands and wishes to proceed with the procedure. Written consent was obtained. Ultrasound was performed to localize and mark an adequate pocket of fluid in the RIGHT chest. The area was then prepped and draped in the normal sterile fashion. 1% Lidocaine was used for local anesthesia. Under ultrasound guidance a a syringe with a 22 gauge needle was advanced into the small RIGHT pleural effusion. 16 cc of fluid was performed. The catheter was removed and a dressing applied. FINDINGS: A total of approximately 16 mL of slightly cloudy yellow fluid was removed. Sample was sent to the laboratory as requested by the clinical team. IMPRESSION: Successful ultrasound guided right thoracentesis yielding 16 mL of pleural fluid. Electronically Signed   By: Ulyses Southward M.D.   On: 12/01/2020 14:24    Catarina Hartshorn, DO  [facility]  If 7PM-7AM, please contact night-coverage [email or web address] Password [email or web address] 12/09/2020, 11:24 AM   LOS: 9 days

## 2020-12-10 DIAGNOSIS — F1994 Other psychoactive substance use, unspecified with psychoactive substance-induced mood disorder: Secondary | ICD-10-CM

## 2020-12-10 DIAGNOSIS — U071 COVID-19: Secondary | ICD-10-CM | POA: Diagnosis not present

## 2020-12-10 DIAGNOSIS — F1410 Cocaine abuse, uncomplicated: Secondary | ICD-10-CM | POA: Diagnosis not present

## 2020-12-10 DIAGNOSIS — A419 Sepsis, unspecified organism: Secondary | ICD-10-CM | POA: Diagnosis not present

## 2020-12-10 DIAGNOSIS — E1010 Type 1 diabetes mellitus with ketoacidosis without coma: Secondary | ICD-10-CM | POA: Diagnosis not present

## 2020-12-10 LAB — GLUCOSE, CAPILLARY
Glucose-Capillary: 262 mg/dL — ABNORMAL HIGH (ref 70–99)
Glucose-Capillary: 309 mg/dL — ABNORMAL HIGH (ref 70–99)
Glucose-Capillary: 483 mg/dL — ABNORMAL HIGH (ref 70–99)
Glucose-Capillary: 327 mg/dL — ABNORMAL HIGH (ref 70–99)

## 2020-12-10 LAB — COMPREHENSIVE METABOLIC PANEL
ALT: 62 U/L — ABNORMAL HIGH (ref 0–44)
AST: 34 U/L (ref 15–41)
Albumin: 2.1 g/dL — ABNORMAL LOW (ref 3.5–5.0)
Alkaline Phosphatase: 292 U/L — ABNORMAL HIGH (ref 38–126)
Anion gap: 8 (ref 5–15)
BUN: 12 mg/dL (ref 6–20)
CO2: 33 mmol/L — ABNORMAL HIGH (ref 22–32)
Calcium: 8 mg/dL — ABNORMAL LOW (ref 8.9–10.3)
Chloride: 96 mmol/L — ABNORMAL LOW (ref 98–111)
Creatinine, Ser: 0.42 mg/dL — ABNORMAL LOW (ref 0.44–1.00)
GFR, Estimated: >60 mL/min (ref >60)
Glucose, Bld: 302 mg/dL — ABNORMAL HIGH (ref 70–99)
Potassium: 3.8 mmol/L (ref 3.5–5.1)
Sodium: 137 mmol/L (ref 135–145)
Total Bilirubin: 0.6 mg/dL (ref 0.3–1.2)
Total Protein: 5.2 g/dL — ABNORMAL LOW (ref 6.5–8.1)

## 2020-12-10 LAB — VITAMIN B12: Vitamin B-12: 3026 pg/mL — ABNORMAL HIGH (ref 180–914)

## 2020-12-10 LAB — D-DIMER, QUANTITATIVE: D-Dimer, Quant: 2.05 ug/mL-FEU — ABNORMAL HIGH (ref 0.00–0.50)

## 2020-12-10 LAB — C-REACTIVE PROTEIN: CRP: 1.3 mg/dL — ABNORMAL HIGH (ref <1.0)

## 2020-12-10 LAB — IRON AND TIBC
Iron: 23 ug/dL — ABNORMAL LOW (ref 28–170)
Saturation Ratios: 11 % (ref 10.4–31.8)
TIBC: 209 ug/dL — ABNORMAL LOW (ref 250–450)
UIBC: 186 ug/dL

## 2020-12-10 LAB — FERRITIN: Ferritin: 198 ng/mL (ref 11–307)

## 2020-12-10 LAB — FOLATE: Folate: 8.1 ng/mL (ref >5.9)

## 2020-12-10 LAB — MAGNESIUM: Magnesium: 1.9 mg/dL (ref 1.7–2.4)

## 2020-12-10 MED ORDER — INSULIN ASPART 100 UNIT/ML ~~LOC~~ SOLN
20.0000 [IU] | Freq: Once | SUBCUTANEOUS | Status: AC
  Administered 2020-12-10: 20 [IU] via SUBCUTANEOUS

## 2020-12-10 MED ORDER — PHENOL 1.4 % MT LIQD
1.0000 | OROMUCOSAL | Status: DC | PRN
  Administered 2020-12-10: 1 via OROMUCOSAL
  Filled 2020-12-10: qty 177

## 2020-12-10 MED ORDER — INSULIN ASPART 100 UNIT/ML ~~LOC~~ SOLN
0.0000 [IU] | Freq: Every day | SUBCUTANEOUS | Status: DC
  Administered 2020-12-10: 4 [IU] via SUBCUTANEOUS
  Administered 2020-12-12: 2 [IU] via SUBCUTANEOUS

## 2020-12-10 MED ORDER — INSULIN ASPART 100 UNIT/ML ~~LOC~~ SOLN
0.0000 [IU] | Freq: Three times a day (TID) | SUBCUTANEOUS | Status: DC
  Administered 2020-12-11: 5 [IU] via SUBCUTANEOUS
  Administered 2020-12-11: 11 [IU] via SUBCUTANEOUS
  Administered 2020-12-12: 2 [IU] via SUBCUTANEOUS
  Administered 2020-12-12: 8 [IU] via SUBCUTANEOUS
  Administered 2020-12-13: 11 [IU] via SUBCUTANEOUS

## 2020-12-10 MED ORDER — INSULIN GLARGINE 100 UNIT/ML ~~LOC~~ SOLN
20.0000 [IU] | Freq: Every day | SUBCUTANEOUS | Status: DC
  Administered 2020-12-11: 20 [IU] via SUBCUTANEOUS
  Filled 2020-12-10 (×4): qty 0.2

## 2020-12-10 NOTE — Progress Notes (Signed)
PROGRESS NOTE  [name] ZOX:[id number] DOB: [birth year] DOA: 11/30/2020 PCP: [facility]  Brief History:  Mrs. [name] was admitted to the hospital with the working diagnosis of acute hypoxemic respiratory failure due to SARS COVID 19 viral infection, complicated with bacterial pneumonia(parapneumonic effusion/ empyema)with MRSA bacteremia and septic shock.   35 yo female with past medical history of T1 DM, GERD, and cocaine use who presented withweakness and hypoxemia. Recently diagnosed with community acquired pneumonia about 2 days prior to admission, prescribed cefdinir and azithromycin. Her symptoms worsened with with progressive dyspnea, weakness, nausea and vomiting. EMS was called and her oxymetry was found to be 66% and glucose 600 mg/dl. She was transported to the ED. On her initial physical examination BP 110/79, HR 103, T 94.6 and oxymetry 86 to 89%. Lungs with no wheezing or rhonchi, heart S1-S2 present, abdomen soft and non tender, no lower extremity edema.  Sodium 123, potassium 3.1, chloride 82, bicarb 11, glucose 1623, creatinine 1.15, anion gap 30, magnesium 2.9, AST 246, ALT 299, total bilirubins 1.7. White cell count 5.3, hemoglobin 12.2, hematocrit 39.9, platelets 273. SARS COVID-19 positive. Urinalysis>500 glucose, 0-5 white cells, specific gravity 1.022. Drug screen negative.  Chest radiograph with bilateral multilobar infiltrates, predominantly left lower lobe, right lower lobe, right upper lobe. Platelike atelectasis left midlung.  EKG 101 bpm, rightward axis, QTC 500, sinus rhythm, Q-wave 83, aVF, V4-V6, no significant ST segment T wave changes.  Patient was treated with systemic steroids and remdesivir.  She had high oxygen requirements, from heated high flow nasal cannula to non invasive mechanical ventilation and eventually to invasive mechanical ventilation 11/30/20   She did not received tocilizumab due to  concomitant bacterial infection.  Diagnosed with parapneumonic effusion/ empyema, bacterial pneumonia over infection (MRSA).  Her condition deteriorated into shock requiring vasopressors.   Liberated from mechanical ventilation on 12/03/2020, placed on high flow nasal cannula. CT surgery recommended not invasive intervention, since effusions had decreased in size.   Slowly decreasing oxygen requirements but patient very weak and deconditioned.  Barrier to d/c remains to be confusion, agitation and interference with medical therapy.  Precedex restarted on 12/08/20   Assessment/Plan: Acute hypoxemic respiratory failure due to SARS COVID 19 viral pneumonia,  -now stable on RA -completed 5 days remdesivir -no tocilizumab or baricitinib due to concomitant bacterial infection -inflammatory markers overall trending down -continuing solumedrol 40 mg IV daily  bacterial pneumonia super-infection (MRSA) with para-pneumonic effusion/ empyema. Septic shock.  -sepsis present on admission -BP now stable -12/01/20--Pleural fluid culture positive for MRSA. Blood cultures with no growth.  -Transthoracic echocardiogram with no vegetations. (no tocilizumab or baricitinib due to concomitant bacterial infection) -stable on 2L>>RA -WBC improving  PCCM was following, discussed case with cardiothoracic surgery who did not recommend surgical invention at this time and favored prolonged antibiotic course. On methylprednisolone 40 mg IV daily, continue antibiotic therapy with IV vancomycin/ metronidazole. Dr. Ella Jubilee consulted Dr Earlene Plater ID (phone) for duration of antibiotic therapy, recommendations from 2 to 3 weeks of IV vancomycin or orallinezolid. Sheisnoted to not be a good candidate for IV antibiotics on discharge due to her history of substance abuse   Acute systolic heart failure.  12/03/20 EF 30 -35% with akinetic, mid and distal anterior wall, mid and distal anterior septum, entire  apex, mid and distal inferior wall and mid inferoseptal segment.   -Continue furosemide 20 mg Iv for diuresis, -personally reviewed Chest x-ray from 2/22 shows persistent  pulmonary edema and small bilateral pleural effusions.  -Continue diuresis to target negative fluid balance.  -Holding on b blocker and acei due to soft BPs -will ultimately need outpt cards work up  Hyperkalemia/ hypokalemia/ hyponatremia.  -Na improving with diuresis -K repleted -mag 1.9  Acute metabolic encephalopathy (hx of substance abuse). -Patient was transiently on Precedex infusion, but was weaned off on 2/20 -continued to have intermittent episodes of agitation/confusion where she is trying to get out of bed Lorazepam has not had any significant impact on her agitation -Since it was felt that she would be a risk to herself, she has been started back on low-dose Precedex on 12/08/20 -Continue with aspiration and fall precautions. -On multivitamins and thiamine.  -Continue out of bed to chair tid with meals as tolerated, PT and OT.   -speech evaluation, for now continue with dysphagia2diet.   Gabapentin dose has been decreased PRN lorazepam for agitation.  -12/09/20--wean precedex  Uncontrolled T1DM with DKA and hyperglycemia. -DKA now resolved -Blood sugars are stable on Lantus and sliding scale insulin. -Patient with very poor oral intake.  -11/30/20 A1C--14.7  Acute anemia ad thrombocytopenia.  -Hgb8.8and hct 26.  -She does not have any evidence of obvious bleeding -check anemia panel  Generalized weakness -een by physical therapy with recommendation for skilled nursing facility placement -Continue physical therapy while in hospital  Patient continue to be at high risk for worsening sepsis      Status is: Inpatient  Remains inpatient appropriate because:IV treatments appropriate due to intensity of illness or inability to take PO   Dispo: The patient is from:  Home  Anticipated d/c is to: SNF  Anticipated d/c date is: 3 days  Patient currently Not medically stable for d/c              Difficult to place patient Yes        Family Communication: no  Family at bedside  Consultants:  PCCM  Code Status:  FULL  DVT Prophylaxis:  Rodman Lovenox   Procedures: As Listed in Progress Note Above  Antibiotics: Vanco 2/15>>   Subjective: Patient denies fevers, chills, headache, chest pain, dyspnea, nausea, vomiting, diarrhea, abdominal pain, dysuria, hematuria, hematochezia, and melena.   Objective: Vitals:   12/10/20 0700 12/10/20 0800 12/10/20 0900 12/10/20 1000  BP: 107/69 (!) 89/62 (!) 101/54   Pulse: (!) 53 69 75 80  Resp: (!) 23 19 (!) 23 20  Temp:      TempSrc:      SpO2: 98% 97% 100% 93%  Weight:      Height:        Intake/Output Summary (Last 24 hours) at 12/10/2020 1047 Last data filed at 12/10/2020 1003 Gross per 24 hour  Intake 936.62 ml  Output 2575 ml  Net -1638.38 ml   Weight change:  Exam:   General:  Pt is alert, follows commands appropriately, not in acute distress  HEENT: No icterus, No thrush, No neck mass, [place]/AT  Cardiovascular: RRR, S1/S2, no rubs, no gallops  Respiratory: bibasilar rales. No wheeze  Abdomen: Soft/+BS, non tender, non distended, no guarding  Extremities: No edema, No lymphangitis, No petechiae, No rashes, no synovitis   Data Reviewed: I have personally reviewed following labs and imaging studies Basic Metabolic Panel: Recent Labs  Lab 12/04/20 0409 12/05/20 0416 12/06/20 0316 12/07/20 0456 12/08/20 0448 12/09/20 0524 12/10/20 0413  NA 143 141 140 143 137 141 137  K 4.3 3.9 3.3* 3.3* 3.3* 3.7 3.8  CL 111  108 104 100 96* 100 96*  CO2 29 30 24 30  34* 33* 33*  GLUCOSE 143* 164* 206* 203* 151* 179* 302*  BUN 27* 21* 15 9 <5* 6 12  CREATININE 0.51 0.32* 0.34* 0.46 0.34* 0.34* 0.42*  CALCIUM 7.2* 7.1* 7.3* 7.5* 7.7* 7.9*  8.0*  MG 2.0 1.7  --   --  1.5* 1.9 1.9   Liver Function Tests: Recent Labs  Lab 12/04/20 0409 12/05/20 0416 12/06/20 0316 12/09/20 0524 12/10/20 0413  AST 57* 53* 61* 66* 34  ALT 118* 104* 110* 76* 62*  ALKPHOS 399* 332* 321* 273* 292*  BILITOT 0.7 0.7 1.4* 0.8 0.6  PROT 4.6* 4.6* 5.1* 5.1* 5.2*  ALBUMIN 1.5* 1.6* 2.0* 2.0* 2.1*   No results for input(s): LIPASE, AMYLASE in the last 168 hours. No results for input(s): AMMONIA in the last 168 hours. Coagulation Profile: No results for input(s): INR, PROTIME in the last 168 hours. CBC: Recent Labs  Lab 12/04/20 0409 12/05/20 0416 12/06/20 1305 12/08/20 0448 12/09/20 0524  WBC 13.3* 12.9* 18.8* 11.9* 7.6  NEUTROABS 10.7* 11.3* 17.8* 9.1*  --   HGB 10.6* 10.9* 10.3* 8.8* 9.3*  HCT 31.9* 32.6* 31.0* 26.3* 28.6*  MCV 87.6 86.2 87.1 84.8 87.7  PLT 108* 99* 139* 122* 133*   Cardiac Enzymes: No results for input(s): CKTOTAL, CKMB, CKMBINDEX, TROPONINI in the last 168 hours. BNP: Invalid input(s): POCBNP CBG: Recent Labs  Lab 12/09/20 0922 12/09/20 1142 12/09/20 1625 12/09/20 2017 12/10/20 0747  GLUCAP 193* 182* 113* 160* 262*   HbA1C: No results for input(s): HGBA1C in the last 72 hours. Urine analysis:    Component Value Date/Time   COLORURINE STRAW (A) 11/30/2020 0949   APPEARANCEUR CLEAR 11/30/2020 0949   LABSPEC 1.022 11/30/2020 0949   PHURINE 5.0 11/30/2020 0949   GLUCOSEU >=500 (A) 11/30/2020 0949   HGBUR MODERATE (A) 11/30/2020 0949   BILIRUBINUR NEGATIVE 11/30/2020 0949   KETONESUR 20 (A) 11/30/2020 0949   PROTEINUR NEGATIVE 11/30/2020 0949   UROBILINOGEN 1.0 07/28/2015 1402   NITRITE NEGATIVE 11/30/2020 0949   LEUKOCYTESUR NEGATIVE 11/30/2020 0949   Sepsis Labs: @LABRCNTIP (procalcitonin:4,lacticidven:4) ) Recent Results (from the past 240 hour(s))  MRSA PCR Screening     Status: Abnormal   Collection Time: 11/30/20 12:30 PM   Specimen: Nasopharyngeal  Result Value Ref Range Status   MRSA  by PCR POSITIVE (A) NEGATIVE Final    Comment:        The GeneXpert MRSA Assay (FDA approved for NASAL specimens only), is one component of a comprehensive MRSA colonization surveillance program. It is not intended to diagnose MRSA infection nor to guide or monitor treatment for MRSA infections. RESULT CALLED TO, READ BACK BY AND VERIFIED WITH: ASHLEY SHELTON 2/14 @ 1631 BY Chauncey MannS. BEARD Performed at [facility], [address] [place], [place] [address]   Gram stain     Status: None   Collection Time: 12/01/20  2:02 PM   Specimen: Pleura  Result Value Ref Range Status   Specimen Description PLEURAL  Final   Special Requests NONE  Final   Gram Stain   Final    CYTOSPIN SMEAR NO ORGANISMS SEEN WBC PRESENT, PREDOMINANTLY PMN Performed at [facility], [address] [place], [place] [address]    Report Status 12/01/2020 FINAL  Final  Body fluid culture w Gram Stain     Status: None   Collection Time: 12/01/20  2:04 PM   Specimen: Pleura; Body Fluid  Result Value Ref Range  Status   Specimen Description   Final    PLEURAL Performed at [facility], [address] [place], [place] [address]    Special Requests   Final    Normal Performed at [facility], [address] [place], [place] [address]    Gram Stain   Final    MODERATE WBC PRESENT, PREDOMINANTLY PMN NO ORGANISMS SEEN    Culture   Final    RARE METHICILLIN RESISTANT STAPHYLOCOCCUS AUREUS CRITICAL RESULT CALLED TO, READ BACK BY AND VERIFIED WITH: RN A.LARIMORE AT 1000 ON 12/03/2020 BY T.SAAD Performed at [facility], [address], [place], [place] [address]    Report Status 12/04/2020 FINAL  Final   Organism ID, Bacteria METHICILLIN RESISTANT STAPHYLOCOCCUS AUREUS  Final      Susceptibility   Methicillin resistant staphylococcus aureus - MIC*    CIPROFLOXACIN >=8 RESISTANT Resistant     ERYTHROMYCIN >=8 RESISTANT Resistant     GENTAMICIN <=0.5 SENSITIVE Sensitive     OXACILLIN >=4 RESISTANT Resistant      TETRACYCLINE <=1 SENSITIVE Sensitive     VANCOMYCIN 1 SENSITIVE Sensitive     TRIMETH/SULFA <=10 SENSITIVE Sensitive     CLINDAMYCIN <=0.25 SENSITIVE Sensitive     RIFAMPIN <=0.5 SENSITIVE Sensitive     Inducible Clindamycin NEGATIVE Sensitive     * RARE METHICILLIN RESISTANT STAPHYLOCOCCUS AUREUS     Scheduled Meds: . Chlorhexidine Gluconate Cloth  6 each Topical Daily  . enoxaparin (LOVENOX) injection  40 mg Subcutaneous Q24H  . feeding supplement (GLUCERNA SHAKE)  237 mL Oral QID  . furosemide  20 mg Intravenous Daily  . gabapentin  300 mg Oral TID  . insulin aspart  0-5 Units Subcutaneous QHS  . insulin aspart  0-9 Units Subcutaneous TID WC  . insulin glargine  5 Units Subcutaneous Daily  . methylPREDNISolone (SOLU-MEDROL) injection  40 mg Intravenous Daily  . multivitamin  15 mL Oral Daily  . nicotine  21 mg Transdermal Daily  . sodium chloride flush  10-40 mL Intracatheter Q12H  . thiamine injection  100 mg Intravenous BID   Continuous Infusions: . dexmedetomidine (PRECEDEX) IV infusion 0.3 mcg/kg/hr (12/10/20 1040)  . famotidine (PEPCID) IV 20 mg (12/10/20 1003)  . metronidazole Stopped (12/10/20 0455)  . vancomycin Stopped (12/10/20 0739)    Procedures/Studies: CT CHEST WO CONTRAST  Result Date: 12/03/2020 CLINICAL DATA:  COVID pneumonia. EXAM: CT CHEST WITHOUT CONTRAST TECHNIQUE: Multidetector CT imaging of the chest was performed following the standard protocol without IV contrast. COMPARISON:  Chest CT 05/18/2017 FINDINGS: Cardiovascular: The heart is normal in size. No pericardial effusion. The endotracheal tube is in good position just above the carina. There is an NG tube coursing down the esophagus and into the stomach. The right IJ central venous catheter is in good position. Mediastinum/Nodes: Borderline enlarged mediastinal and hilar lymph nodes likely reactive/inflammatory. Lungs/Pleura: Underlying emphysematous changes are noted with superimposed severe  interstitial process in the upper lobes with areas of cystic change which could be bronchiectasis or pneumatocele see. There is fairly dense airspace consolidation in both lower lobes with suspected areas of mild cavitation most notably in the right lower lobe medially. There are also small bilateral pleural effusions, right larger than left. No pneumothorax or pneumomediastinum. Upper Abdomen: Upper abdominal ascites is noted with fluid around the gallbladder, in the hepato renal fossa and pericolic gutters. Musculoskeletal: No significant bony findings. IMPRESSION: 1. Underlying emphysematous changes with superimposed severe interstitial process in the upper lobes with areas  of cystic change which could be bronchiectasis or pneumatoceles. 2. Dense airspace consolidation in both lower lobes with suspected areas of mild cavitation most notably in the right lower lobe medially. 3. Small bilateral pleural effusions, right larger than left. 4. Upper abdominal ascites. 5. Lines and tubes in good position without complicating features. Emphysema (ICD10-J43.9). Electronically Signed   By: Rudie Meyer M.D.   On: 12/03/2020 13:20   CT ABDOMEN PELVIS W CONTRAST  Result Date: 11/28/2020 CLINICAL DATA:  Abdominal pain, biliary obstruction suspected. EXAM: CT ABDOMEN AND PELVIS WITH CONTRAST TECHNIQUE: Multidetector CT imaging of the abdomen and pelvis was performed using the standard protocol following bolus administration of intravenous contrast. CONTRAST:  OMNIPAQUE IOHEXOL 300 MG/ML  SOLN COMPARISON:  CT abdomen pelvis 04/10/2017, CT angio chest 05/18/2017 FINDINGS: Lower chest: Partially visualized trace right pleural effusion. Associated passive atelectasis of the right lower lobe. Dependent right lower lobe consolidation. Masslike right upper and right lower lobe consolidations (4:3, 5:33). Hepatobiliary: The liver measures at upper limits of normal in size. No focal liver abnormality. No gallstones,  gallbladder wall thickening, or pericholecystic fluid. No biliary dilatation. Pancreas: No focal lesion. Normal pancreatic contour. No surrounding inflammatory changes. No main pancreatic ductal dilatation. Spleen: Normal in size without focal abnormality. Adrenals/Urinary Tract: No adrenal nodule bilaterally. Bilateral kidneys enhance symmetrically. No hydronephrosis. No hydroureter. The urinary bladder is unremarkable. Stomach/Bowel: Fluid level within the gastric lumen likely related to ingested material. Stomach is within normal limits. Prominent but not dilated loop of small bowel within the lower anterior abdomen with no definite transition point. No pneumatosis. No evidence of bowel wall thickening or dilatation. Appendix appears normal. Vascular/Lymphatic: No abdominal aorta or iliac aneurysm. 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 No abdominal, pelvic, or inguinal lymphadenopathy. 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 Uterus and bilateral adnexa are unremarkable. Other: Trace simple free fluid within the pelvis that could be physiologic in etiology. No intraperitoneal free gas. No organized fluid collection. Musculoskeletal: No acute or significant osseous findings. IMPRESSION: 1. Dependent right lower lobe consolidation. Masslike right upper and right lower lobe consolidation. Findings likely represents infection/inflammation. Recommend repeat chest x-ray PA and lateral in 3-4 weeks to exclude underlying malignancy. 2. Trace volume right pleural effusion. 3. 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 Electronically Signed   By: Tish Frederickson M.D.   On: 11/28/2020 01:35   DG Chest Port 1 View  Result Date: 12/07/2020 CLINICAL DATA:  Dyspnea. EXAM: PORTABLE CHEST 1 VIEW COMPARISON:  December 05, 2020. FINDINGS: Improved diffuse interstitial opacities with slight worsening of dense left basilar  consolidation. Small bilateral pleural effusions, likely slightly increased on the right. No visible pneumothorax. Cardiomediastinal silhouette is within normal limits. IMPRESSION: 1. Improved diffuse interstitial opacities with slight worsening of dense left basilar consolidation. 2. Small bilateral pleural effusions, likely slightly increased on the right. Electronically Signed   By: Feliberto Harts MD   On: 12/07/2020 13:42   DG Chest Port 1 View  Result Date: 12/05/2020 CLINICAL DATA:  COVID positive with severe MRSA septic shock EXAM: PORTABLE CHEST 1 VIEW COMPARISON:  12/02/2020 FINDINGS: Right arm PICC line tip is at the cavoatrial junction. Interval removal of NG tube and ET tube. Heart size is normal. Bilateral scratch set small bilateral pleural effusions are again noted. Diffuse interstitial and airspace densities appear unchanged from previous exam. IMPRESSION: 1. Persistent bilateral pleural effusions and bilateral interstitial and airspace opacities compatible with  multifocal atypical viral pneumonia. 2. Stable appearance of the right arm PICC line. Electronically Signed   By: Signa Kell M.D.   On: 12/05/2020 07:01   DG CHEST PORT 1 VIEW  Result Date: 12/02/2020 CLINICAL DATA:  Intubated, COVID EXAM: PORTABLE CHEST 1 VIEW COMPARISON:  12/01/2020 FINDINGS: Endotracheal tube is 3 cm above the carina. NG tube is in the stomach. Layering bilateral pleural effusions. Interstitial prominence throughout the lungs. Bilateral lower lobe airspace disease. No real change since prior study. IMPRESSION: Persistent interstitial prominence and pulmonary infiltrates. Layering bilateral effusions. No change. Electronically Signed   By: Charlett Nose M.D.   On: 12/02/2020 06:33   DG Chest Portable 1 View  Result Date: 12/01/2020 CLINICAL DATA:  RIGHT pleural effusion, COVID-19, sepsis EXAM: PORTABLE CHEST 1 VIEW COMPARISON:  Portable exam at 1345 hrs compared to 0921 hrs FINDINGS: Tip of endotracheal  tube projects 1.9 cm above carina. NG tube extends into stomach. Stable heart size and mediastinal contours normal. Persistent diffuse pulmonary infiltrates unchanged. Persistent small RIGHT pleural effusion. No pneumothorax following RIGHT thoracentesis. IMPRESSION: No pneumothorax following diagnostic RIGHT thoracentesis. Persistent pulmonary infiltrates and small RIGHT pleural effusion. Electronically Signed   By: Ulyses Southward M.D.   On: 12/01/2020 14:20   DG CHEST PORT 1 VIEW  Result Date: 12/01/2020 CLINICAL DATA:  35 year old ventilated female status post central line placement. EXAM: PORTABLE CHEST 1 VIEW COMPARISON:  Chest x-ray 11/30/2020. FINDINGS: An endotracheal tube is in place with tip 3.3 cm above the carina. There is a right-sided internal jugular central venous catheter with tip terminating in the distal superior vena cava. Widespread areas of ill-defined opacities and areas of interstitial prominence are noted throughout the lungs bilaterally, most evident throughout the mid to lower lungs, minimally improved compared to the prior examination. Moderate right and small left pleural effusions. No pneumothorax. Pulmonary vasculature is largely obscured. Heart size is normal. The patient is rotated to the left on today's exam, resulting in distortion of the mediastinal contours and reduced diagnostic sensitivity and specificity for mediastinal pathology. IMPRESSION: 1. Support apparatus, as above. 2. The appearance of the lungs is most compatible with severe multilobar bilateral pneumonia related to known COVID infection. Overall, aeration has slightly improved compared to the prior study. 3. Moderate right and small left pleural effusions similar to the prior examination. Electronically Signed   By: Trudie Reed M.D.   On: 12/01/2020 09:37   DG CHEST PORT 1 VIEW  Result Date: 11/30/2020 CLINICAL DATA:  COVID-19 positive, tobacco abuse, intubated EXAM: PORTABLE CHEST 1 VIEW COMPARISON:   11/30/2020, 11/28/2020 FINDINGS: Single frontal view of the chest demonstrates interval placement of endotracheal tube overlying tracheal air column, tip 1.7 cm above carina. Enteric catheter passes below diaphragm tip excluded by collimation, side port projecting over the gastric body. Multifocal bilateral ground-glass airspace disease has progressed since prior study, which could reflect progressive pneumonia or superimposed edema. Stable right pleural effusion. No pneumothorax. IMPRESSION: 1. Support devices as above. 2. Progressive diffuse ground-glass airspace disease, which may reflect worsening pneumonia or superimposed edema. 3. Stable right pleural effusion. Electronically Signed   By: Sharlet Salina M.D.   On: 11/30/2020 20:07   DG Chest Port 1 View  Result Date: 11/30/2020 CLINICAL DATA:  Shortness of breath, recent pneumonia in a 35 year old female EXAM: PORTABLE CHEST 1 VIEW COMPARISON:  November 28, 2020 FINDINGS: Interstitial and airspace disease worse than on the prior study, superimposed on a background of pulmonary emphysema. Interval development of a  small RIGHT-sided pleural effusion. Trachea midline. Cardiomediastinal contours and hilar structures are stable. Nodular component to the airspace process with focal areas of nodularity the for example in the RIGHT upper and LEFT lower lobe. No acute skeletal process on limited assessment. IMPRESSION: 1. Worsening interstitial and airspace disease superimposed on a background of pulmonary emphysema. Findings suspicious for multifocal pneumonia. Given nodular component would suggest follow-up to ensure resolution and exclude underlying pulmonary nodules. 2. New small RIGHT-sided pleural effusion. Could also consider the possibility of an overlay of heart failure. Electronically Signed   By: Donzetta Kohut M.D.   On: 11/30/2020 09:21   DG Chest Port 1 View  Result Date: 11/28/2020 CLINICAL DATA:  Nausea, vomiting, fever and cough. EXAM: PORTABLE  CHEST 1 VIEW COMPARISON:  04/06/2020 FINDINGS: Artifact overlies the chest. Heart size is normal. There are patchy pulmonary infiltrates in both lungs, more extensive on the right than the left, consistent with widespread bronchopneumonia. This could be bacterial or viral. No lobar consolidation or collapse. No pleural effusion. No significant bone finding. IMPRESSION: Widespread bilateral bronchopneumonia, right more extensive than left. This could be bacterial or viral. Electronically Signed   By: Paulina Fusi M.D.   On: 11/28/2020 00:26   ECHOCARDIOGRAM COMPLETE  Result Date: 12/03/2020    ECHOCARDIOGRAM REPORT   Patient Name:   YVONE SLAPE Date of Exam: 12/03/2020 Medical Rec #:  [id number]         Height: Accession #:    [id number]        Weight: Date of Birth:  07-Jun-1986         BSA: Patient Age:    34 years          BP:           120/89 mmHg Patient Gender: F                 HR:           77 bpm. [facility]am [facility] Procedure: 2D Echo Indications:    Endocarditis I38  History:        Patient has prior history of Echocardiogram examinations, most                 recent 03/09/2019. Signs/Symptoms:Murmur; Risk Factors:Current                 Smoker and Diabetes. Substance induced mood disorder ,                 Pneumonia, Cocaine Abuse.  Sonographer:    Jeryl Columbia RDCS (AE) Referring Phys: [id number] RAKESH V ALVA IMPRESSIONS  1. Left ventricular ejection fraction, by estimation, is 30 to 35%. The left ventricle has moderately decreased function. The left ventricle demonstrates regional wall motion abnormalities (see scoring diagram/findings for description). Left ventricular  diastolic parameters were normal.  2. Right ventricular systolic function is moderately reduced. The right ventricular size is normal. There is normal pulmonary artery systolic pressure. The estimated right ventricular systolic pressure is 22.0 mmHg.  3. The mitral valve is grossly normal. Mild to moderate mitral valve  regurgitation.  4. The aortic valve is tricuspid. Aortic valve regurgitation is not visualized.  5. The inferior vena cava is normal in size with greater than 50% respiratory variability, suggesting right atrial pressure of 3 mmHg.  6. No obvious valvular vegetations. FINDINGS  Left Ventricle: Left ventricular ejection fraction, by estimation, is 30 to 35%. The left ventricle has moderately decreased function. The left ventricle  demonstrates regional wall motion abnormalities. The left ventricular internal cavity size was normal in size. There is no left ventricular hypertrophy. Left ventricular diastolic parameters were normal.  LV Wall Scoring: The mid and distal anterior wall, mid and distal anterior septum, entire apex, mid and distal inferior wall, and mid inferoseptal segment are akinetic. The basal anteroseptal segment, mid inferolateral segment, and mid anterolateral segment are hypokinetic. The basal inferolateral segment, basal anterolateral segment, basal anterior segment, basal inferior segment, and basal inferoseptal segment are normal. Right Ventricle: The right ventricular size is normal. No increase in right ventricular wall thickness. Right ventricular systolic function is moderately reduced. There is normal pulmonary artery systolic pressure. The tricuspid regurgitant velocity is 2.18 m/s, and with an assumed right atrial pressure of 3 mmHg, the estimated right ventricular systolic pressure is 22.0 mmHg. Left Atrium: Left atrial size was normal in size. Right Atrium: Right atrial size was normal in size. Pericardium: There is no evidence of pericardial effusion. Mitral Valve: The mitral valve is grossly normal. Mild to moderate mitral valve regurgitation, with eccentric posteriorly directed jet. Tricuspid Valve: The tricuspid valve is grossly normal. Tricuspid valve regurgitation is mild. Aortic Valve: The aortic valve is tricuspid. Aortic valve regurgitation is not visualized. Pulmonic Valve: The  pulmonic valve was grossly normal. Pulmonic valve regurgitation is not visualized. Aorta: The aortic root is normal in size and structure. Venous: The inferior vena cava is normal in size with greater than 50% respiratory variability, suggesting right atrial pressure of 3 mmHg. IAS/Shunts: No atrial level shunt detected by color flow Doppler.  LEFT VENTRICLE PLAX 2D LVIDd:         3.72 cm  Diastology LVIDs:         3.15 cm  LV e' medial:    6.53 cm/s LV PW:         1.00 cm  LV E/e' medial:  9.7 LV IVS:        0.80 cm  LV e' lateral:   6.42 cm/s LVOT diam:     1.80 cm  LV E/e' lateral: 9.9 LVOT Area:     2.54 cm  RIGHT VENTRICLE RV S prime:     7.29 cm/s LEFT ATRIUM             RIGHT ATRIUM LA diam:        2.20 cm RA Area:     9.12 cm LA Vol (A2C):   24.3 ml RA Volume:   20.90 ml LA Vol (A4C):   28.3 ml LA Biplane Vol: 27.0 ml   AORTA Ao Root diam: 2.40 cm MITRAL VALVE               TRICUSPID VALVE MV Area (PHT): 4.21 cm    TR Peak grad:   19.0 mmHg MV Decel Time: 180 msec    TR Vmax:        218.00 cm/s MV E velocity: 63.40 cm/s MV A velocity: 70.70 cm/s  SHUNTS MV E/A ratio:  0.90        Systemic Diam: 1.80 cm Nona Dell MD Electronically signed by Nona Dell MD Signature Date/Time: 12/03/2020/4:11:56 PM    Final    [place] EKG SITE RITE  Result Date: 12/04/2020 If Site Rite image not attached, placement could not be confirmed due to current cardiac rhythm.  US Abdomen Limited RUQ (LIVER/GB)  Result Date: 11/30/2020 CLINICAL DATA:  Right upper quadrant pain and elevated LFTs EXAM: ULTRASOUND ABDOMEN LIMITED RIGHT UPPER QUADRANT COMPARISON:  None.  FINDINGS: Gallbladder: Gallbladder is well distended. Mild gallbladder sludge is noted. No gallstones are seen. Common bile duct: Diameter: 3.2 mm. Liver: Mild increased echogenicity consistent with fatty infiltration. No mass lesion is noted. Portal vein is patent on color Doppler imaging with normal direction of blood flow towards the liver. Other: None.  IMPRESSION: Fatty infiltration of the liver. Gallbladder sludge.  No cholelithiasis or wall thickening is noted. Electronically Signed   By: Alcide Clever M.D.   On: 11/30/2020 12:50   US THORACENTESIS ASP PLEURAL SPACE W/IMG GUIDE  Result Date: 12/01/2020 INDICATION: COVID-19 pneumonia, sepsis, small RIGHT pleural effusion EXAM: ULTRASOUND GUIDED DIAGNOSTIC RIGHT THORACENTESIS MEDICATIONS: None. COMPLICATIONS: None immediate. PROCEDURE: An ultrasound guided thoracentesis was thoroughly discussed with the patient and questions answered. The benefits, risks, alternatives and complications were also discussed. The patient understands and wishes to proceed with the procedure. Written consent was obtained. Ultrasound was performed to localize and mark an adequate pocket of fluid in the RIGHT chest. The area was then prepped and draped in the normal sterile fashion. 1% Lidocaine was used for local anesthesia. Under ultrasound guidance a a syringe with a 22 gauge needle was advanced into the small RIGHT pleural effusion. 16 cc of fluid was performed. The catheter was removed and a dressing applied. FINDINGS: A total of approximately 16 mL of slightly cloudy yellow fluid was removed. Sample was sent to the laboratory as requested by the clinical team. IMPRESSION: Successful ultrasound guided right thoracentesis yielding 16 mL of pleural fluid. Electronically Signed   By: Ulyses Southward M.D.   On: 12/01/2020 14:24    Catarina Hartshorn, DO  [facility]  If 7PM-7AM, please contact night-coverage [email or web address] Password [facility] 12/10/2020, 10:47 AM   LOS: 10 days

## 2020-12-10 NOTE — Progress Notes (Signed)
Physical Therapy Treatment Patient Details Name: [name] MRN: [id number] DOB: 04-12-1986 Today's Date: 12/10/2020    History of Present Illness [name] is a 35 y.o. female with medical history significant for type 1 diabetes, GERD, and cocaine use who presented to the ED via EMS for evaluation of weakness and significant hypoxemia.  She was noted to be 66% on pulse oximetry on room air and blood sugar was noted to be greater than 600.  She has a history of prior episodes of DKA and actively uses cocaine, but denies any IV drug use.  She presented to the ED approximately 2 days ago and at that time was having some nausea and vomiting was found to have pneumonia for which she was discharged on cefdinir and Zithromax.  She has been compliant with her antibiotics, but has had worsening cough as well as shortness of breath and weakness and now nausea/vomiting and diarrhea as well.  She is vaccinated for COVID, but does not have booster.    PT Comments    Patient progressing well with improved sitting balance, increased BLE strength for sit to stands, transfers and taking steps in room.  Patient unsteady on feet requiring Min hand held assist to maintain standing balance, safer using RW and able to take side steps, steps forward/backwards without loss of balance, limited mostly due to fatigue and on room air throughout with SpO2 between 92 - 95% with exertion.   Patient tolerated sitting up in chair after therapy - nursing staff notified.  Patient will benefit from continued physical therapy in hospital and recommended venue below to increase strength, balance, endurance for safe ADLs and gait.   Follow Up Recommendations  SNF;Supervision for mobility/OOB;Supervision - Intermittent     Equipment Recommendations  Rolling walker with 5" wheels    Recommendations for Other Services       Precautions / Restrictions Precautions Precautions: Fall Restrictions Weight Bearing  Restrictions: No    Mobility  Bed Mobility Overal bed mobility: Needs Assistance Bed Mobility: Supine to Sit     Supine to sit: Min guard;HOB elevated     General bed mobility comments: increased time, labored movement    Transfers Overall transfer level: Needs assistance Equipment used: Rolling walker (2 wheeled) Transfers: Sit to/from [employer] Sit to Stand: Min guard Stand pivot transfers: Min guard       General transfer comment: increased BLE strength for sit to stands and transfers  Ambulation/Gait Ambulation/Gait assistance: Min assist Gait Distance (Feet): 18 Feet Assistive device: Rolling walker (2 wheeled);1 person hand held assist Gait Pattern/deviations: Decreased step length - right;Decreased step length - left;Decreased stride length Gait velocity: decreased   General Gait Details: slow labored side steps, forward/backwards, slightly unsteady without loss of balance, has to support her body weight mostly with BUE, requires bilateral hand held assist when attempting to stand without AD   Stairs             Wheelchair Mobility    Modified Rankin (Stroke Patients Only)       Balance Overall balance assessment: Needs assistance Sitting-balance support: Feet supported;No upper extremity supported Sitting balance-Leahy Scale: Fair Sitting balance - Comments: fair/good seated at EOB   Standing balance support: During functional activity;No upper extremity supported Standing balance-Leahy Scale: Poor Standing balance comment: fair using RW                            Cognition Arousal/Alertness: Awake/alert  Behavior During Therapy: WFL for tasks assessed/performed Overall Cognitive Status: Within Functional Limits for tasks assessed                                        Exercises General Exercises - Lower Extremity Long Arc Quad: Seated;AROM;Strengthening;Both;10 reps Hip Flexion/Marching:  Seated;AROM;Strengthening;Both;10 reps Toe Raises: Seated;AROM;Strengthening;Both;15 reps Heel Raises: Seated;AROM;Strengthening;Both;15 reps    General Comments        Pertinent Vitals/Pain Pain Assessment: No/denies pain    Home Living                      Prior Function            PT Goals (current goals can now be found in the care plan section) Acute Rehab PT Goals Patient Stated Goal: return home with family to assist PT Goal Formulation: With patient Time For Goal Achievement: 12/21/20 Potential to Achieve Goals: Good Progress towards PT goals: Progressing toward goals    Frequency    Min 3X/week      PT Plan Current plan remains appropriate    Co-evaluation              AM-PAC PT "6 Clicks" Mobility   Outcome Measure  Help needed turning from your back to your side while in a flat bed without using bedrails?: None Help needed moving from lying on your back to sitting on the side of a flat bed without using bedrails?: A Little Help needed moving to and from a bed to a chair (including a wheelchair)?: A Little Help needed standing up from a chair using your arms (e.g., wheelchair or bedside chair)?: A Little Help needed to walk in hospital room?: A Lot Help needed climbing 3-5 steps with a railing? : A Lot 6 Click Score: 17    End of Session   Activity Tolerance: Patient tolerated treatment well;Patient limited by fatigue Patient left: in chair;with call bell/phone within reach;with chair alarm set Nurse Communication: Mobility status PT Visit Diagnosis: Unsteadiness on feet (R26.81);Other abnormalities of gait and mobility (R26.89);Muscle weakness (generalized) (M62.81)     Time: 1428-1500 PT Time Calculation (min) (ACUTE ONLY): 32 min  Charges:  $Therapeutic Exercise: 8-22 mins $Therapeutic Activity: 8-22 mins                     3:18 PM, 12/10/20 Ocie Bob, MPT Physical Therapist with [facility] 336  [phone] office [phone] mobile phone

## 2020-12-10 NOTE — Progress Notes (Signed)
Pharmacy Antibiotic Note  [name] is a 35 y.o. female admitted on 11/30/2020 with sepsis/PNA.   Pharmacy has been consulted for vancomycin dosing.  Plan: Vanco trough 14 Continue vancomycin 1000 mg IV every 12 hours. Monitor labs, c/s, and patient improvement. Transition to Zyvox 600 mg BID when patient tolerating PO medications.  Height: 5' (152.4 cm) Weight: 42.9 kg (94 lb 9.2 oz) IBW/kg (Calculated) : 45.5  Temp (24hrs), Avg:98.1 F (36.7 C), Min:97 F (36.1 C), Max:99.8 F (37.7 C)  Recent Labs  Lab 12/04/20 0409 12/05/20 0416 12/06/20 0316 12/06/20 1305 12/07/20 0456 12/08/20 0448 12/09/20 0524 12/09/20 1633 12/10/20 0413  WBC 13.3* 12.9*  --  18.8*  --  11.9* 7.6  --   --   CREATININE 0.51 0.32* 0.34*  --  0.46 0.34* 0.34*  --  0.42*  VANCOTROUGH  --   --  12*  --   --   --   --  14*  --     Estimated Creatinine Clearance: 65.8 mL/min (A) (by C-G formula based on SCr of 0.42 mg/dL (L)).    Allergies  Allergen Reactions  . Codeine Anaphylaxis and Nausea And Vomiting  . Penicillins Rash    As a teenager  . Tramadol Itching    Antimicrobials this admission: Cefepime 2/14 >> 2/18 Metronidazole 2/18 >> Vancomycin 2/15>  Microbiology results: 2/14 BCx: ng 2/15 Pleural fluid- MRSA  2/14 MRSA PCR: positive 2/14 SARS-2 CV is positive  Thank you for allowing pharmacy to be a part of this patient's care.  Judeth Cornfield, PharmD Clinical Pharmacist 12/10/2020 9:08 AM

## 2020-12-10 NOTE — Progress Notes (Signed)
  Speech Language Pathology Treatment: Dysphagia  Patient Details Name: [name] MRN: [id number] DOB: 1986-01-23 Today's Date: 12/10/2020 Time: 9163-8466 SLP Time Calculation (min) (ACUTE ONLY): 21 min  Assessment / Plan / Recommendation Clinical Impression  Pt seen for ongoing dysphagia intervention. She is much more alert today and able to self feed, converse, and follow all directions. She consumed 80% of her puree breakfast meal. Pt assessed with graham crackers (some found half eaten in room despite modified diet recommendation). Pt with impaired mastication due to edentulous status and results in prolonged oral prep, but no overt signs of aspiration and no significant oral residuals. Pt reports that she typically "always" wears her dentures. Pt consumed cup and straw sips of thin liquids without signs of reduced airway protection. Dentures were finally located in her room- recommend D3/mech soft and thin liquids with standard aspiration and reflux precautions, intermittent supervision and set up assistance. Do not feed Pt if not alert. Pt was cued to wear her O2 during meals (she removed for breakfast and 02 ~88% and improved to 94% once nasal cannula replaced). SLP will follow during acute stay, do not anticipate SLP needs post d/c from acute at this time given improvement. Above to RN.   HPI HPI: [name] was admitted to the hospital with the working diagnosis of acute hypoxemic respiratory failure due to SARS COVID 19 viral infection, complicated with bacterial pneumonia (parapneumonic effusion/ empyema) with MRSA bacteremia and septic shock. 35 yo female with past medical history of T1 DM, GERD, and cocaine use who presented with weakness and hypoxemia. Recently diagnosed with community acquired pneumonia about 2 days prior to admission, prescribed cefdinir and azithromycin. Her symptoms worsened with with progressive dyspnea, weakness, nausea and vomiting. EMS was called and her  oxymetry was found to be 66% and glucose 600 mg/dl. She was transported to the ED.  On her initial physical examination BP 110/79, HR 103, T 94.6 and oxymetry 86 to 89%. Lungs with no wheezing or rhonchi, heart S1-S2 present, abdomen soft and non tender, no lower extremity edema. She was intubated 2/14-2/17/22. BSE requested.      SLP Plan  Continue with current plan of care       Recommendations  Diet recommendations: Dysphagia 3 (mechanical soft);Thin liquid Liquids provided via: Cup Medication Administration: Whole meds with puree Supervision: Patient able to self feed Compensations: Slow rate;Small sips/bites Postural Changes and/or Swallow Maneuvers: Seated upright 90 degrees;Upright 30-60 min after meal                Oral Care Recommendations: Oral care BID;Staff/trained caregiver to provide oral care Follow up Recommendations: None SLP Visit Diagnosis: Dysphagia, unspecified (R13.10) Plan: Continue with current plan of care       Thank you,  [name], CCC-SLP [phone]                [name] 12/10/2020, 8:47 AM

## 2020-12-11 DIAGNOSIS — U071 COVID-19: Secondary | ICD-10-CM | POA: Diagnosis not present

## 2020-12-11 DIAGNOSIS — Z720 Tobacco use: Secondary | ICD-10-CM

## 2020-12-11 DIAGNOSIS — E1010 Type 1 diabetes mellitus with ketoacidosis without coma: Secondary | ICD-10-CM | POA: Diagnosis not present

## 2020-12-11 DIAGNOSIS — I5021 Acute systolic (congestive) heart failure: Secondary | ICD-10-CM

## 2020-12-11 DIAGNOSIS — F1410 Cocaine abuse, uncomplicated: Secondary | ICD-10-CM | POA: Diagnosis not present

## 2020-12-11 DIAGNOSIS — A419 Sepsis, unspecified organism: Secondary | ICD-10-CM | POA: Diagnosis not present

## 2020-12-11 LAB — COMPREHENSIVE METABOLIC PANEL
ALT: 50 U/L — ABNORMAL HIGH (ref 0–44)
AST: 27 U/L (ref 15–41)
Albumin: 2.1 g/dL — ABNORMAL LOW (ref 3.5–5.0)
Alkaline Phosphatase: 245 U/L — ABNORMAL HIGH (ref 38–126)
Anion gap: 7 (ref 5–15)
BUN: 11 mg/dL (ref 6–20)
CO2: 31 mmol/L (ref 22–32)
Calcium: 7.8 mg/dL — ABNORMAL LOW (ref 8.9–10.3)
Chloride: 95 mmol/L — ABNORMAL LOW (ref 98–111)
Creatinine, Ser: 0.52 mg/dL (ref 0.44–1.00)
GFR, Estimated: >60 mL/min (ref >60)
Glucose, Bld: 421 mg/dL — ABNORMAL HIGH (ref 70–99)
Potassium: 3.5 mmol/L (ref 3.5–5.1)
Sodium: 133 mmol/L — ABNORMAL LOW (ref 135–145)
Total Bilirubin: 0.4 mg/dL (ref 0.3–1.2)
Total Protein: 5.3 g/dL — ABNORMAL LOW (ref 6.5–8.1)

## 2020-12-11 LAB — CBC
HCT: 29.2 % — ABNORMAL LOW (ref 36.0–46.0)
Hemoglobin: 9.5 g/dL — ABNORMAL LOW (ref 12.0–15.0)
MCH: 28.5 pg (ref 26.0–34.0)
MCHC: 32.5 g/dL (ref 30.0–36.0)
MCV: 87.7 fL (ref 80.0–100.0)
Platelets: 211 10*3/uL (ref 150–400)
RBC: 3.33 MIL/uL — ABNORMAL LOW (ref 3.87–5.11)
RDW: 16.2 % — ABNORMAL HIGH (ref 11.5–15.5)
WBC: 8.6 10*3/uL (ref 4.0–10.5)
nRBC: 0 % (ref 0.0–0.2)

## 2020-12-11 LAB — MAGNESIUM: Magnesium: 1.5 mg/dL — ABNORMAL LOW (ref 1.7–2.4)

## 2020-12-11 LAB — GLUCOSE, CAPILLARY
Glucose-Capillary: 454 mg/dL — ABNORMAL HIGH (ref 70–99)
Glucose-Capillary: 330 mg/dL — ABNORMAL HIGH (ref 70–99)
Glucose-Capillary: 216 mg/dL — ABNORMAL HIGH (ref 70–99)
Glucose-Capillary: 135 mg/dL — ABNORMAL HIGH (ref 70–99)

## 2020-12-11 MED ORDER — FAMOTIDINE 20 MG PO TABS
20.0000 mg | ORAL_TABLET | Freq: Two times a day (BID) | ORAL | Status: DC
  Administered 2020-12-11 – 2020-12-13 (×5): 20 mg via ORAL
  Filled 2020-12-11 (×5): qty 1

## 2020-12-11 MED ORDER — INSULIN ASPART 100 UNIT/ML ~~LOC~~ SOLN
25.0000 [IU] | Freq: Once | SUBCUTANEOUS | Status: AC
  Administered 2020-12-11: 25 [IU] via SUBCUTANEOUS

## 2020-12-11 MED ORDER — INSULIN ASPART 100 UNIT/ML ~~LOC~~ SOLN
6.0000 [IU] | Freq: Three times a day (TID) | SUBCUTANEOUS | Status: DC
  Administered 2020-12-11 – 2020-12-12 (×4): 6 [IU] via SUBCUTANEOUS

## 2020-12-11 MED ORDER — PROSOURCE PLUS PO LIQD
30.0000 mL | Freq: Two times a day (BID) | ORAL | Status: DC
  Administered 2020-12-11 – 2020-12-13 (×4): 30 mL via ORAL
  Filled 2020-12-11 (×4): qty 30

## 2020-12-11 MED ORDER — THIAMINE HCL 100 MG PO TABS
100.0000 mg | ORAL_TABLET | Freq: Every day | ORAL | Status: DC
  Administered 2020-12-11 – 2020-12-13 (×3): 100 mg via ORAL
  Filled 2020-12-11 (×3): qty 1

## 2020-12-11 MED ORDER — INSULIN GLARGINE 100 UNIT/ML ~~LOC~~ SOLN
25.0000 [IU] | Freq: Every evening | SUBCUTANEOUS | Status: DC
  Administered 2020-12-11: 25 [IU] via SUBCUTANEOUS
  Filled 2020-12-11 (×3): qty 0.25

## 2020-12-11 MED ORDER — MAGNESIUM SULFATE 2 GM/50ML IV SOLN
2.0000 g | Freq: Once | INTRAVENOUS | Status: AC
  Administered 2020-12-11: 2 g via INTRAVENOUS
  Filled 2020-12-11: qty 50

## 2020-12-11 NOTE — Progress Notes (Signed)
Nutrition Follow-up  DOCUMENTATION CODES:      INTERVENTION:  Glucerna Shake po QID, each supplement provides 220 kcal and 10 grams of protein  ProSource Plus 30 ml TID   NUTRITION DIAGNOSIS:   Increased nutrient needs related to acute illness,catabolic illness (COVID and MRSA) as evidenced by estimated needs.   GOAL:   Patient will meet greater than or equal to 90% of their needs  MONITOR:   PO intake,Supplement acceptance,Weight trends,I & O's,Labs  REASON FOR ASSESSMENT:   LOS    ASSESSMENT:   [name] is a 35 y.o. female with medical history significant for type 1 diabetes, GERD, and cocaine use who presented to the ED via EMS for evaluation of weakness and significant hypoxemia.  She was noted to be 66% on pulse oximetry on room air and blood sugar was noted to be greater than 600.  She has a history of prior episodes of DKA and actively uses cocaine, but denies any IV drug use.  She presented to the ED approximately 2 days ago and at that time was having some nausea and vomiting was found to have pneumonia for which she was discharged on cefdinir and Zithromax.  She has been compliant with her antibiotics, but has had worsening cough as well as shortness of breath and weakness and now nausea/vomiting and diarrhea as well.  She is vaccinated for COVID, but does not have booster. COVID positive and MRSA-septic shock.    Per chart:  Patient intubated 2/14 -> 2/17  Acute heart failure 2/17  ONS started on 2/18. Confusion, agitation 2/22 precedex (weaned on 2/20)  2/25-Meal intake this morning 75% breakfast. Diet fully advanced to CHO modified without texture modification.Able to feed herself.  ST and PT working with patient. Progressing per review. High risk for malnutrition due to prolonged limited po intake and catabolic illness.  Medications: Pepcid, lasix, Insulin (Novolog and Lantus), Solumedrol, MVI, Thiamine. Mag sulfate.  Glucerna Shakes QID started on  2/18.  Labs: Sodium 133 (L).   CBG's- 327, 454, 330 mg/dl   Intake/Output Summary (Last 24 hours) at 12/11/2020 1405 Last data filed at 12/11/2020 1200 Gross per 24 hour  Intake 464.79 ml  Output 3500 ml  Net -3035.21 ml  Net since admission-  (-3.781 liters)  Weight history 43-49 kg the past 2 years.  Diet Order:   Diet Order            Diet Carb Modified Fluid consistency: Thin; Room service appropriate? Yes  Diet effective now                 EDUCATION NEEDS:  No education needs have been identified at this time  Skin:  Skin Assessment: Reviewed RN Assessment  Last BM:  2/24  Height:   Ht Readings from Last 1 Encounters:  12/03/20 5' (1.524 m)    Weight:   Wt Readings from Last 1 Encounters:  12/10/20 42.9 kg    Ideal Body Weight:   45 kg  BMI:  Body mass index is 18.47 kg/m.  Estimated Nutritional Needs:   Kcal:  6754-4920  Protein:  77-86 gr  Fluid:  >1200 ml daily   Royann Shivers MS,RD,CSG,LDN Pager: [name]

## 2020-12-11 NOTE — Progress Notes (Signed)
Occupational Therapy Treatment Patient Details Name: [name] MRN: [id number] DOB: 09/06/1986 Today's Date: 12/11/2020    History of present illness [name] is a 35 y.o. female with medical history significant for type 1 diabetes, GERD, and cocaine use who presented to the ED via EMS for evaluation of weakness and significant hypoxemia.  She was noted to be 66% on pulse oximetry on room air and blood sugar was noted to be greater than 600.  She has a history of prior episodes of DKA and actively uses cocaine, but denies any IV drug use.  She presented to the ED approximately 2 days ago and at that time was having some nausea and vomiting was found to have pneumonia for which she was discharged on cefdinir and Zithromax.  She has been compliant with her antibiotics, but has had worsening cough as well as shortness of breath and weakness and now nausea/vomiting and diarrhea as well.  She is vaccinated for COVID, but does not have booster.   OT comments  Pt pleasant and agreeable to OT treatment this date. Pt demonstrates significant improvement in functional transfers and ADLs this date. Pt able to complete ambulatory transfers with min guard assist using RW. Minimal assist w/o RW and bilateral UE support from PT. PT able to complete toilet hygiene with SPV. Pt able to self-feed medication with nurse and SPV assist. Pt able to complete shoulder A/ROM x10 while seated in chair. Pt will continue to benefit from skilled OT services to address strength and endurance improving functional ADL levels. Pt d/c recommendation changed to Home health OT with 24/7 SPV per pt's increase in independence and reports of family support.   Follow Up Recommendations  Home health OT;Supervision/Assistance - 24 hour (Pt reports husband and daughter can provide 24/7 assist.)    Equipment Recommendations   (Rollator per PT)          Precautions / Restrictions Precautions Precautions:  Fall Restrictions Weight Bearing Restrictions: No       Mobility Bed Mobility Overal bed mobility: Needs Assistance Bed Mobility: Sit to Supine       Sit to supine: Supervision        Transfers Overall transfer level: Needs assistance Equipment used: Rolling walker (2 wheeled) Transfers: Sit to/from [facility] Sit to Stand: Min guard Stand pivot transfers: Min guard       General transfer comment:  (Min guard with RW; minimal assist without RW.)    Balance Overall balance assessment: Needs assistance Sitting-balance support: Feet supported;No upper extremity supported Sitting balance-Leahy Scale: Good Sitting balance - Comments:  (good at EOB)   Standing balance support: Bilateral upper extremity supported;During functional activity Standing balance-Leahy Scale: Fair Standing balance comment:  (poor to fair; LOB posteriorly w/o walker. Fair with RW)                           ADL either performed or assessed with clinical judgement   ADL Overall ADL's : Needs assistance/impaired Eating/Feeding: Supervision/ safety;Sitting Eating/Feeding Details (indicate cue type and reason): Able to feed herslef medication with SPV.                     Toilet Transfer: Ambulation;Minimal Print production planner Details (indicate cue type and reason): Ambulatory transfer to toilet from EOB w/o walker. PT providing bilaterally UE support. Minimal assist. Toileting- Clothing Manipulation and Hygiene: Supervision/safety;Sitting/lateral lean Toileting - Clothing Manipulation Details (indicate cue type and reason):  Completed while on toilet.             Vision   Vision Assessment?: No apparent visual deficits              Cognition Arousal/Alertness: Awake/alert Behavior During Therapy: WFL for tasks assessed/performed Overall Cognitive Status: Within Functional Limits for tasks assessed                                           Exercises Exercises: Shoulder Shoulder Exercises Shoulder Flexion: AROM;10 reps Shoulder ABduction: AROM;10 reps (Horizontal ABD, Protraction x10 each. Completed seated in chair.)                Pertinent Vitals/ Pain       Pain Assessment: No/denies pain                                                          Frequency  Min 2X/week        Progress Toward Goals  OT Goals(current goals can now be found in the care plan section)  Progress towards OT goals: Progressing toward goals  Acute Rehab OT Goals Patient Stated Goal: return home with family to assist OT Goal Formulation: With patient Time For Goal Achievement: 12/21/20 Potential to Achieve Goals: Good ADL Goals Pt Will Perform Eating: with supervision;sitting (Met goal this date) Pt Will Perform Grooming: with supervision;sitting Pt Will Perform Upper Body Bathing: with mod assist;sitting;bed level Pt Will Perform Upper Body Dressing: with min assist;bed level;sitting Pt Will Perform Lower Body Dressing: with mod assist;bed level Pt Will Transfer to Toilet: with mod assist;stand pivot transfer (Met goal this date) Pt/caregiver will Perform Home Exercise Program: Increased ROM;Increased strength;Both right and left upper extremity;With Supervision;Independently  Plan Discharge plan needs to be updated    Co-evaluation    PT/OT/SLP Co-Evaluation/Treatment: Yes (Partially) Reason for Co-Treatment: For patient/therapist safety;To address functional/ADL transfers                             End of Session Equipment Utilized During Treatment: Rolling walker  OT Visit Diagnosis: Unsteadiness on feet (R26.81);Muscle weakness (generalized) (M62.81);Feeding difficulties (R63.3)   Activity Tolerance Patient tolerated treatment well   Patient Left in bed;with call bell/phone within reach;with bed alarm set   Nurse Communication Other (comment) (Nurse providing  meds during session.)        Time: 4585-9292 OT Time Calculation (min): 28 min  Charges: OT General Charges $OT Visit: 1 Visit OT Treatments $Self Care/Home Management : 8-22 mins $Therapeutic Exercise: 8-22 mins  Josias Tomerlin OT, MOT    Larey Seat 12/11/2020, 10:26 AM

## 2020-12-11 NOTE — Progress Notes (Addendum)
Inpatient Diabetes Program Recommendations  AACE/ADA: New Consensus Statement on Inpatient Glycemic Control  Target Ranges:  Prepandial:   less than 140 mg/dL      Peak postprandial:   less than 180 mg/dL (1-2 hours)      Critically ill patients:  140 - 180 mg/dL   Results for [name], [name] (MRN [id number]) as of 12/11/2020 09:26  Ref. Range 12/10/2020 07:47 12/10/2020 12:04 12/10/2020 17:11 12/10/2020 22:09 12/11/2020 08:06  Glucose-Capillary Latest Ref Range: 70 - 99 mg/dL 782 (H) 956 (H) 213 (H) 327 (H) 454 (H)  Results for [name], [name] (MRN [id number]) as of 12/11/2020 10:47  Ref. Range 11/30/2020 13:40  Hemoglobin A1C Latest Ref Range: 4.8 - 5.6 % 14.7 (H)   Review of Glycemic Control  Diabetes history:DM1 (makes NO insulin; requires basal, correction, and meal coverage insulin) Outpatient Diabetes medications:Lantus 35 units daily, Novolog 1-15 units TID with meals (1 unit per 15 grams of carbs) Current orders for Inpatient glycemic control:Lantus 20 units daily, Novolog 0-15 units TID with meals, Novolog 0-5 units QHS; Solumedrol 40 mg daily   Inpatient Diabetes Program Recommendations:    Insulin: Noted Lantus increased from 5 to 20 units to start today. If steroids are continued and if post prandial glucose is consistently over 180 mg/dl, please consider ordering Novolog 3 units TID with meals for meal coverage if patient eats at least 50% of meals.  Addendum 12/11/20@10 :29- Spoke with patient over the phone about diabetes. Patient alert and oriented. Patient reports that she was dx with DM1 about 13 years ago and she notes that her daughter also has DM1. Patient reports she has needed DM medications and supplies at home.  Patient reports being followed by PCP at [facility] for diabetes management. She reports she was taking Lantus 35 units daily and Novolog 150/50/15 (I unit for every 15 grams of carbs and 1 unit for every 50 mg/dl above target of 629 mg/dl).  Patient reports taking Lantus consistently but admits that she sometimes does not take Novolog with meals especially if she is out at [employer].  Patient reports glucose is up and down. Patient reports that she does not recall her last A1C but states it is up and down as well.  Discussed A1C results (14.7% on 11/30/20) and explained that current A1C indicates an average glucose of 375 mg/dl over the past 2-3 months. Discussed glucose and A1C goals. Discussed importance of checking CBGs and maintaining good CBG control to prevent long-term and short-term complications. Explained how hyperglycemia leads to damage within blood vessels which lead to the common complications seen with uncontrolled diabetes. Stressed to the patient the importance of improving glycemic control to prevent further complications from uncontrolled diabetes. Patient states that she knows she has got to do a better job at taking care of her DM and she plans to get serious about her health. Encouraged patient to ask PCP about referring her to an Endocrinologist for assistance with improving DM control.  Patient reports that she will be going for rehab at [facility] when she is ready to be discharged from the hospital. Encouraged patient to stay involved with glucose and insulin she is given at facility and to advocate for changes if she feels they are needed. Encouraged patient to check glucose 4 times a day, take Lantus consistently, to take Novolog consistently before meals, to follow up with PCP regarding DM control, and ask PCP about referral to Endocrinologist.  Patient verbalized understanding of information discussed and reports  no further questions at this time related to diabetes.  Thanks, Orlando Penner, RN, MSN, CDE Diabetes Coordinator Inpatient Diabetes Program [phone] (Team Pager from 8am to 5pm)

## 2020-12-11 NOTE — Progress Notes (Signed)
PROGRESS NOTE  [name] FYB:[id number] DOB: 1986-01-19 DOA: 11/30/2020 PCP: [facility]   Brief History: [name] was admitted to the hospital with the working diagnosis of acute hypoxemic respiratory failure due to SARS COVID 19 viral infection, complicated with bacterial pneumonia(parapneumonic effusion/ empyema)with MRSA bacteremia and septic shock.   35 yo female with past medical history of T1 DM, GERD, and cocaine use who presented withweakness and hypoxemia. Recently diagnosed with community acquired pneumonia about 2 days prior to admission, prescribed cefdinir and azithromycin. Her symptoms worsened with with progressive dyspnea, weakness, nausea and vomiting. EMS was called and her oxymetry was found to be 66% and glucose 600 mg/dl. She was transported to the ED. On her initial physical examination BP 110/79, HR 103, T 94.6 and oxymetry 86 to 89%. Lungs with no wheezing or rhonchi, heart S1-S2 present, abdomen soft and non tender, no lower extremity edema.  Sodium 123, potassium 3.1, chloride 82, bicarb 11, glucose 1623, creatinine 1.15, anion gap 30, magnesium 2.9, AST 246, ALT 299, total bilirubins 1.7. White cell count 5.3, hemoglobin 12.2, hematocrit 39.9, platelets 273. SARS COVID-19 positive. Urinalysis>500 glucose, 0-5 white cells, specific gravity 1.022. Drug screen negative.  Chest radiograph with bilateral multilobar infiltrates, predominantly left lower lobe, right lower lobe, right upper lobe. Platelike atelectasis left midlung.  EKG 101 bpm, rightward axis, QTC 500, sinus rhythm, Q-wave 83, aVF, V4-V6, no significant ST segment T wave changes.  Patient was treated with systemic steroids and remdesivir.  She had high oxygen requirements, from heated high flow nasal cannula to non invasive mechanical ventilation and eventually to invasive mechanical ventilation 11/30/20   She did not received tocilizumab due to  concomitant bacterial infection.  Diagnosed with parapneumonic effusion/ empyema, bacterial pneumonia over infection (MRSA).  Her condition deteriorated into shock requiring vasopressors.   Liberated from mechanical ventilation on 12/03/2020, placed on high flow nasal cannula. CT surgery recommended not invasive intervention, since effusions had decreased in size.   Slowly decreasing oxygen requirements but patient very weak and deconditioned.  Barrier to d/c remains to be confusion, agitation and interference with medical therapy. Precedex restarted on 12/08/20   Assessment/Plan: Acute hypoxemic respiratory failure due to SARS COVID 19 viral pneumonia,  -now stable on RA -completed 5 days remdesivir -no tocilizumab or baricitinib due to concomitant bacterial infection -inflammatory markers overall trending down -continuing solumedrol 40 mg IV daily  bacterial pneumonia super-infection (MRSA) with para-pneumonic effusion/ empyema. Septic shock.  -sepsis present on admission -BP now stable -12/01/20--Pleural fluid culture positive for MRSA. Blood cultures with no growth.  -Transthoracic echocardiogram with no vegetations. (no tocilizumab or baricitinib due to concomitant bacterial infection) -stable on 2L>>RA -WBC improving  PCCM was following, discussed case with cardiothoracic surgery who did not recommend surgical invention at this time and favored prolonged antibiotic course. On methylprednisolone 40 mg IV daily, continue antibiotic therapy with IV vancomycin/ metronidazole. Dr. Ella Jubilee consulted Dr Earlene Plater ID (phone) for duration of antibiotic therapy, recommendations from 2 to 3 weeks of IV vancomycin or orallinezolid. Sheisnoted to not be a good candidate for IV antibiotics on discharge due to her history of substance abuse   Acute systolic heart failure.  12/03/20 EF 30 -35% with akinetic, mid and distal anterior wall, mid and distal anterior septum, entire  apex, mid and distal inferior wall and mid inferoseptal segment.   -Continuefurosemide 20 mg Iv for diuresis, -personally reviewedChest x-ray from 2/22 shows persistent pulmonary edema and  small bilateral pleural effusions. -Continue diuresis to target negative fluid balance. -Holding on b blocker and acei due to soft BPs -will ultimately need outpt cards work up  Hyperkalemia/ hypokalemia/ hyponatremia. -Na improving with diuresis -K repleted  Acute metabolic encephalopathy (hx of substance abuse). -Patient was transiently on Precedex infusion, but was weaned off on 2/20 -continued to have intermittent episodes of agitation/confusion where she is trying to get out of bed Lorazepam has not had any significant impact on her agitation -Since it was felt that she would be a risk to herself, she has been started back on low-dose Precedexon 12/08/20 -Continue with aspiration and fall precautions. -On multivitamins and thiamine.  -Continue out of bed to chair tid with meals as tolerated, PT and OT.   -speech evaluation, for now continue with dysphagia2diet.   Gabapentin dose has been decreased PRN lorazepam for agitation. -12/11/20--weaned off precedex  UncontrolledT1DM with DKA and hyperglycemia. -DKA now resolved -Patient with very poor oral intake.  -11/30/20 A1C--14.7 -increase lantus to 25 units -add novolog 6 units with meals  Acute anemia and thrombocytopenia.  -Hgb8.8and hct 26.  -She does not have any evidence of obvious bleeding -iron saturation 11%, ferritin 198 -B12 3026, folate 8.1  Generalized weakness -een by physical therapy with recommendation for skilled nursing facility placement -Continue physical therapy while in hospital  Hypomagnesemia -replete    Status is: Inpatient  Remains inpatient appropriate because:IV treatments appropriate due to intensity of illness or inability to take PO   Dispo: The patient is  from:Home Anticipated d/c is to:SNF Anticipated d/c date is: 3 days Patient currently Not medically stable for d/c Difficult to place patient Yes        Family Communication:noFamily at bedside  Consultants:PCCM  Code Status: FULL  DVT Prophylaxis: [place] Lovenox   Procedures: As Listed in Progress Note Above  Antibiotics: Vanco 2/15>>      Subjective: Patient denies fevers, chills, headache, chest pain, dyspnea, nausea, vomiting, diarrhea, abdominal pain, dysuria, hematuria, hematochezia, and melena.   Objective: Vitals:   12/11/20 0800 12/11/20 0835 12/11/20 0900 12/11/20 1100  BP: (!) 141/88  124/73 132/89  Pulse:    98  Resp: 19 20 17 18   Temp:  98.4 F (36.9 C)    TempSrc:  Oral    SpO2: 96%   99%  Weight:      Height:        Intake/Output Summary (Last 24 hours) at 12/11/2020 1135 Last data filed at 12/11/2020 1100 Gross per 24 hour  Intake 464.79 ml  Output 3200 ml  Net -2735.21 ml   Weight change:  Exam:   General:  Pt is alert, follows commands appropriately, not in acute distress  HEENT: No icterus, No thrush, No neck mass, [place]/AT  Cardiovascular: RRR, S1/S2, no rubs, no gallops Respiratory: bibasilar crackles .no wheeze  Abdomen: Soft/+BS, non tender, non distended, no guarding  Extremities: No edema, No lymphangitis, No petechiae, No rashes, no synovitis   Data Reviewed: I have personally reviewed following labs and imaging studies Basic Metabolic Panel: Recent Labs  Lab 12/05/20 0416 12/06/20 0316 12/07/20 0456 12/08/20 0448 12/09/20 0524 12/10/20 0413 12/11/20 0519  NA 141   < > 143 137 141 137 133*  K 3.9   < > 3.3* 3.3* 3.7 3.8 3.5  CL 108   < > 100 96* 100 96* 95*  CO2 30   < > 30 34* 33* 33* 31  GLUCOSE 164*   < > 203* 151* 179*  302* 421*  BUN 21*   < > 9 <5* 6 12 11   CREATININE 0.32*   < > 0.46 0.34* 0.34* 0.42* 0.52  CALCIUM 7.1*   < >  7.5* 7.7* 7.9* 8.0* 7.8*  MG 1.7  --   --  1.5* 1.9 1.9 1.5*   < > = values in this interval not displayed.   Liver Function Tests: Recent Labs  Lab 12/05/20 0416 12/06/20 0316 12/09/20 0524 12/10/20 0413 12/11/20 0519  AST 53* 61* 66* 34 27  ALT 104* 110* 76* 62* 50*  ALKPHOS 332* 321* 273* 292* 245*  BILITOT 0.7 1.4* 0.8 0.6 0.4  PROT 4.6* 5.1* 5.1* 5.2* 5.3*  ALBUMIN 1.6* 2.0* 2.0* 2.1* 2.1*   No results for input(s): LIPASE, AMYLASE in the last 168 hours. No results for input(s): AMMONIA in the last 168 hours. Coagulation Profile: No results for input(s): INR, PROTIME in the last 168 hours. CBC: Recent Labs  Lab 12/05/20 0416 12/06/20 1305 12/08/20 0448 12/09/20 0524 12/11/20 0519  WBC 12.9* 18.8* 11.9* 7.6 8.6  NEUTROABS 11.3* 17.8* 9.1*  --   --   HGB 10.9* 10.3* 8.8* 9.3* 9.5*  HCT 32.6* 31.0* 26.3* 28.6* 29.2*  MCV 86.2 87.1 84.8 87.7 87.7  PLT 99* 139* 122* 133* 211   Cardiac Enzymes: No results for input(s): CKTOTAL, CKMB, CKMBINDEX, TROPONINI in the last 168 hours. BNP: Invalid input(s): POCBNP CBG: Recent Labs  Lab 12/10/20 0747 12/10/20 1204 12/10/20 1711 12/10/20 2209 12/11/20 0806  GLUCAP 262* 309* 483* 327* 454*   HbA1C: No results for input(s): HGBA1C in the last 72 hours. Urine analysis:    Component Value Date/Time   COLORURINE STRAW (A) 11/30/2020 0949   APPEARANCEUR CLEAR 11/30/2020 0949   LABSPEC 1.022 11/30/2020 0949   PHURINE 5.0 11/30/2020 0949   GLUCOSEU >=500 (A) 11/30/2020 0949   HGBUR MODERATE (A) 11/30/2020 0949   BILIRUBINUR NEGATIVE 11/30/2020 0949   KETONESUR 20 (A) 11/30/2020 0949   PROTEINUR NEGATIVE 11/30/2020 0949   UROBILINOGEN 1.0 07/28/2015 1402   NITRITE NEGATIVE 11/30/2020 0949   LEUKOCYTESUR NEGATIVE 11/30/2020 0949   Sepsis Labs: @LABRCNTIP (procalcitonin:4,lacticidven:4) ) Recent Results (from the past 240 hour(s))  Gram stain     Status: None   Collection Time: 12/01/20  2:02 PM   Specimen: Pleura   Result Value Ref Range Status   Specimen Description PLEURAL  Final   Special Requests NONE  Final   Gram Stain   Final    CYTOSPIN SMEAR NO ORGANISMS SEEN WBC PRESENT, PREDOMINANTLY PMN Performed at [facility], [address], [place], [address] [place]    Report Status 12/01/2020 FINAL  Final  Body fluid culture w Gram Stain     Status: None   Collection Time: 12/01/20  2:04 PM   Specimen: Body Fluid  Result Value Ref Range Status   Specimen Description   Final    PLEURAL Performed at [facility], [address] [place], [address] [place]    Special Requests   Final    Normal Performed at [facility], [address] [place], [address] [place]    Gram Stain   Final    MODERATE WBC PRESENT, PREDOMINANTLY PMN NO ORGANISMS SEEN    Culture   Final    RARE METHICILLIN RESISTANT STAPHYLOCOCCUS AUREUS CRITICAL RESULT CALLED TO, READ BACK BY AND VERIFIED WITH: RN A.LARIMORE AT 1000 ON 12/03/2020 BY T.SAAD Performed at [facility], [address] [place], [address] [place]  Report Status 12/04/2020 FINAL  Final   Organism ID, Bacteria METHICILLIN RESISTANT STAPHYLOCOCCUS AUREUS  Final      Susceptibility   Methicillin resistant staphylococcus aureus - MIC*    CIPROFLOXACIN >=8 RESISTANT Resistant     ERYTHROMYCIN >=8 RESISTANT Resistant     GENTAMICIN <=0.5 SENSITIVE Sensitive     OXACILLIN >=4 RESISTANT Resistant     TETRACYCLINE <=1 SENSITIVE Sensitive     VANCOMYCIN 1 SENSITIVE Sensitive     TRIMETH/SULFA <=10 SENSITIVE Sensitive     CLINDAMYCIN <=0.25 SENSITIVE Sensitive     RIFAMPIN <=0.5 SENSITIVE Sensitive     Inducible Clindamycin NEGATIVE Sensitive     * RARE METHICILLIN RESISTANT STAPHYLOCOCCUS AUREUS     Scheduled Meds: . Chlorhexidine Gluconate Cloth  6 each Topical Daily  . enoxaparin (LOVENOX) injection  40 mg Subcutaneous Q24H  . famotidine  20 mg Oral BID  . feeding supplement (GLUCERNA SHAKE)  237 mL Oral QID  . furosemide  20 mg  Intravenous Daily  . gabapentin  300 mg Oral TID  . insulin aspart  0-15 Units Subcutaneous TID WC  . insulin aspart  0-5 Units Subcutaneous QHS  . insulin aspart  6 Units Subcutaneous TID WC  . insulin glargine  25 Units Subcutaneous QPM  . methylPREDNISolone (SOLU-MEDROL) injection  40 mg Intravenous Daily  . multivitamin  15 mL Oral Daily  . nicotine  21 mg Transdermal Daily  . sodium chloride flush  10-40 mL Intracatheter Q12H  . thiamine  100 mg Oral Daily   Continuous Infusions: . metronidazole 500 mg (12/11/20 0444)  . vancomycin 1,000 mg (12/11/20 8295)    Procedures/Studies: CT CHEST WO CONTRAST  Result Date: 12/03/2020 CLINICAL DATA:  COVID pneumonia. EXAM: CT CHEST WITHOUT CONTRAST TECHNIQUE: Multidetector CT imaging of the chest was performed following the standard protocol without IV contrast. COMPARISON:  Chest CT 05/18/2017 FINDINGS: Cardiovascular: The heart is normal in size. No pericardial effusion. The endotracheal tube is in good position just above the carina. There is an NG tube coursing down the esophagus and into the stomach. The right IJ central venous catheter is in good position. Mediastinum/Nodes: Borderline enlarged mediastinal and hilar lymph nodes likely reactive/inflammatory. Lungs/Pleura: Underlying emphysematous changes are noted with superimposed severe interstitial process in the upper lobes with areas of cystic change which could be bronchiectasis or pneumatocele see. There is fairly dense airspace consolidation in both lower lobes with suspected areas of mild cavitation most notably in the right lower lobe medially. There are also small bilateral pleural effusions, right larger than left. No pneumothorax or pneumomediastinum. Upper Abdomen: Upper abdominal ascites is noted with fluid around the gallbladder, in the hepato renal fossa and pericolic gutters. Musculoskeletal: No significant bony findings. IMPRESSION: 1. Underlying emphysematous changes with  superimposed severe interstitial process in the upper lobes with areas of cystic change which could be bronchiectasis or pneumatoceles. 2. Dense airspace consolidation in both lower lobes with suspected areas of mild cavitation most notably in the right lower lobe medially. 3. Small bilateral pleural effusions, right larger than left. 4. Upper abdominal ascites. 5. Lines and tubes in good position without complicating features. Emphysema (ICD10-J43.9). Electronically Signed   By: Rudie Meyer M.D.   On: 12/03/2020 13:20   CT ABDOMEN PELVIS W CONTRAST  Result Date: 11/28/2020 CLINICAL DATA:  Abdominal pain, biliary obstruction suspected. EXAM: CT ABDOMEN AND PELVIS WITH CONTRAST TECHNIQUE: Multidetector CT imaging of the abdomen and pelvis was performed using the standard protocol following bolus administration  of intravenous contrast. CONTRAST:  OMNIPAQUE IOHEXOL 300 MG/ML  SOLN COMPARISON:  CT abdomen pelvis 04/10/2017, CT angio chest 05/18/2017 FINDINGS: Lower chest: Partially visualized trace right pleural effusion. Associated passive atelectasis of the right lower lobe. Dependent right lower lobe consolidation. Masslike right upper and right lower lobe consolidations (4:3, 5:33). Hepatobiliary: The liver measures at upper limits of normal in size. No focal liver abnormality. No gallstones, gallbladder wall thickening, or pericholecystic fluid. No biliary dilatation. Pancreas: No focal lesion. Normal pancreatic contour. No surrounding inflammatory changes. No main pancreatic ductal dilatation. Spleen: Normal in size without focal abnormality. Adrenals/Urinary Tract: No adrenal nodule bilaterally. Bilateral kidneys enhance symmetrically. No hydronephrosis. No hydroureter. The urinary bladder is unremarkable. Stomach/Bowel: Fluid level within the gastric lumen likely related to ingested material. Stomach is within normal limits. Prominent but not dilated loop of small bowel within the lower anterior  abdomen with no definite transition point. No pneumatosis. No evidence of bowel wall thickening or dilatation. Appendix appears normal. Vascular/Lymphatic: No abdominal aorta or iliac aneurysm. 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 No abdominal, pelvic, or inguinal lymphadenopathy. 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 Uterus and bilateral adnexa are unremarkable. Other: Trace simple free fluid within the pelvis that could be physiologic in etiology. No intraperitoneal free gas. No organized fluid collection. Musculoskeletal: No acute or significant osseous findings. IMPRESSION: 1. Dependent right lower lobe consolidation. Masslike right upper and right lower lobe consolidation. Findings likely represents infection/inflammation. Recommend repeat chest x-ray PA and lateral in 3-4 weeks to exclude underlying malignancy. 2. Trace volume right pleural effusion. 3. 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 Electronically Signed   By: Tish Frederickson M.D.   On: 11/28/2020 01:35   DG Chest Port 1 View  Result Date: 12/07/2020 CLINICAL DATA:  Dyspnea. EXAM: PORTABLE CHEST 1 VIEW COMPARISON:  December 05, 2020. FINDINGS: Improved diffuse interstitial opacities with slight worsening of dense left basilar consolidation. Small bilateral pleural effusions, likely slightly increased on the right. No visible pneumothorax. Cardiomediastinal silhouette is within normal limits. IMPRESSION: 1. Improved diffuse interstitial opacities with slight worsening of dense left basilar consolidation. 2. Small bilateral pleural effusions, likely slightly increased on the right. Electronically Signed   By: Feliberto Harts MD   On: 12/07/2020 13:42   DG Chest Port 1 View  Result Date: 12/05/2020 CLINICAL DATA:  COVID positive with severe MRSA septic shock EXAM: PORTABLE CHEST 1 VIEW COMPARISON:  12/02/2020 FINDINGS: Right arm PICC line tip  is at the cavoatrial junction. Interval removal of NG tube and ET tube. Heart size is normal. Bilateral scratch set small bilateral pleural effusions are again noted. Diffuse interstitial and airspace densities appear unchanged from previous exam. IMPRESSION: 1. Persistent bilateral pleural effusions and bilateral interstitial and airspace opacities compatible with multifocal atypical viral pneumonia. 2. Stable appearance of the right arm PICC line. Electronically Signed   By: Signa Kell M.D.   On: 12/05/2020 07:01   DG CHEST PORT 1 VIEW  Result Date: 12/02/2020 CLINICAL DATA:  Intubated, COVID EXAM: PORTABLE CHEST 1 VIEW COMPARISON:  12/01/2020 FINDINGS: Endotracheal tube is 3 cm above the carina. NG tube is in the stomach. Layering bilateral pleural effusions. Interstitial prominence throughout the lungs. Bilateral lower lobe airspace disease. No real change since prior study. IMPRESSION: Persistent interstitial prominence and pulmonary infiltrates. Layering bilateral effusions. No change. Electronically Signed   By: Charlett Nose M.D.   On: 12/02/2020 06:33  DG Chest Portable 1 View  Result Date: 12/01/2020 CLINICAL DATA:  RIGHT pleural effusion, COVID-19, sepsis EXAM: PORTABLE CHEST 1 VIEW COMPARISON:  Portable exam at 1345 hrs compared to 0921 hrs FINDINGS: Tip of endotracheal tube projects 1.9 cm above carina. NG tube extends into stomach. Stable heart size and mediastinal contours normal. Persistent diffuse pulmonary infiltrates unchanged. Persistent small RIGHT pleural effusion. No pneumothorax following RIGHT thoracentesis. IMPRESSION: No pneumothorax following diagnostic RIGHT thoracentesis. Persistent pulmonary infiltrates and small RIGHT pleural effusion. Electronically Signed   By: Ulyses SouthwardMark  Boles M.D.   On: 12/01/2020 14:20   DG CHEST PORT 1 VIEW  Result Date: 12/01/2020 CLINICAL DATA:  35 year old ventilated female status post central line placement. EXAM: PORTABLE CHEST 1 VIEW COMPARISON:   Chest x-ray 11/30/2020. FINDINGS: An endotracheal tube is in place with tip 3.3 cm above the carina. There is a right-sided internal jugular central venous catheter with tip terminating in the distal superior vena cava. Widespread areas of ill-defined opacities and areas of interstitial prominence are noted throughout the lungs bilaterally, most evident throughout the mid to lower lungs, minimally improved compared to the prior examination. Moderate right and small left pleural effusions. No pneumothorax. Pulmonary vasculature is largely obscured. Heart size is normal. The patient is rotated to the left on today's exam, resulting in distortion of the mediastinal contours and reduced diagnostic sensitivity and specificity for mediastinal pathology. IMPRESSION: 1. Support apparatus, as above. 2. The appearance of the lungs is most compatible with severe multilobar bilateral pneumonia related to known COVID infection. Overall, aeration has slightly improved compared to the prior study. 3. Moderate right and small left pleural effusions similar to the prior examination. Electronically Signed   By: Trudie Reedaniel  Entrikin M.D.   On: 12/01/2020 09:37   DG CHEST PORT 1 VIEW  Result Date: 11/30/2020 CLINICAL DATA:  COVID-19 positive, tobacco abuse, intubated EXAM: PORTABLE CHEST 1 VIEW COMPARISON:  11/30/2020, 11/28/2020 FINDINGS: Single frontal view of the chest demonstrates interval placement of endotracheal tube overlying tracheal air column, tip 1.7 cm above carina. Enteric catheter passes below diaphragm tip excluded by collimation, side port projecting over the gastric body. Multifocal bilateral ground-glass airspace disease has progressed since prior study, which could reflect progressive pneumonia or superimposed edema. Stable right pleural effusion. No pneumothorax. IMPRESSION: 1. Support devices as above. 2. Progressive diffuse ground-glass airspace disease, which may reflect worsening pneumonia or superimposed  edema. 3. Stable right pleural effusion. Electronically Signed   By: Sharlet SalinaMichael  Brown M.D.   On: 11/30/2020 20:07   DG Chest Port 1 View  Result Date: 11/30/2020 CLINICAL DATA:  Shortness of breath, recent pneumonia in a 35 year old female EXAM: PORTABLE CHEST 1 VIEW COMPARISON:  November 28, 2020 FINDINGS: Interstitial and airspace disease worse than on the prior study, superimposed on a background of pulmonary emphysema. Interval development of a small RIGHT-sided pleural effusion. Trachea midline. Cardiomediastinal contours and hilar structures are stable. Nodular component to the airspace process with focal areas of nodularity the for example in the RIGHT upper and LEFT lower lobe. No acute skeletal process on limited assessment. IMPRESSION: 1. Worsening interstitial and airspace disease superimposed on a background of pulmonary emphysema. Findings suspicious for multifocal pneumonia. Given nodular component would suggest follow-up to ensure resolution and exclude underlying pulmonary nodules. 2. New small RIGHT-sided pleural effusion. Could also consider the possibility of an overlay of heart failure. Electronically Signed   By: Donzetta KohutGeoffrey  Wile M.D.   On: 11/30/2020 09:21   [facility]  Result Date: 11/28/2020 CLINICAL DATA:  Nausea, vomiting, fever and cough. EXAM: PORTABLE CHEST 1 VIEW COMPARISON:  04/06/2020 FINDINGS: Artifact overlies the chest. Heart size is normal. There are patchy pulmonary infiltrates in both lungs, more extensive on the right than the left, consistent with widespread bronchopneumonia. This could be bacterial or viral. No lobar consolidation or collapse. No pleural effusion. No significant bone finding. IMPRESSION: Widespread bilateral bronchopneumonia, right more extensive than left. This could be bacterial or viral. Electronically Signed   By: Paulina Fusi M.D.   On: 11/28/2020 00:26   ECHOCARDIOGRAM COMPLETE  Result Date: 12/03/2020    ECHOCARDIOGRAM REPORT    Patient Name:   CAPRISHA BRIDGETT Date of Exam: 12/03/2020 Medical Rec #:  [id number]         Height: Accession #:    [id number]        Weight: Date of Birth:  Nov 13, 1985         BSA: Patient Age:    34 years          BP:           120/89 mmHg Patient Gender: F                 HR:           77 bpm. [facility]am [facility] Procedure: 2D Echo Indications:    Endocarditis I38  History:        Patient has prior history of Echocardiogram examinations, most                 recent 03/09/2019. Signs/Symptoms:Murmur; Risk Factors:Current                 Smoker and Diabetes. Substance induced mood disorder ,                 Pneumonia, Cocaine Abuse.  Sonographer:    Jeryl Columbia RDCS (AE) Referring Phys: [id number] RAKESH V ALVA IMPRESSIONS  1. Left ventricular ejection fraction, by estimation, is 30 to 35%. The left ventricle has moderately decreased function. The left ventricle demonstrates regional wall motion abnormalities (see scoring diagram/findings for description). Left ventricular  diastolic parameters were normal.  2. Right ventricular systolic function is moderately reduced. The right ventricular size is normal. There is normal pulmonary artery systolic pressure. The estimated right ventricular systolic pressure is 22.0 mmHg.  3. The mitral valve is grossly normal. Mild to moderate mitral valve regurgitation.  4. The aortic valve is tricuspid. Aortic valve regurgitation is not visualized.  5. The inferior vena cava is normal in size with greater than 50% respiratory variability, suggesting right atrial pressure of 3 mmHg.  6. No obvious valvular vegetations. FINDINGS  Left Ventricle: Left ventricular ejection fraction, by estimation, is 30 to 35%. The left ventricle has moderately decreased function. The left ventricle demonstrates regional wall motion abnormalities. The left ventricular internal cavity size was normal in size. There is no left ventricular hypertrophy. Left ventricular diastolic parameters were  normal.  LV Wall Scoring: The mid and distal anterior wall, mid and distal anterior septum, entire apex, mid and distal inferior wall, and mid inferoseptal segment are akinetic. The basal anteroseptal segment, mid inferolateral segment, and mid anterolateral segment are hypokinetic. The basal inferolateral segment, basal anterolateral segment, basal anterior segment, basal inferior segment, and basal inferoseptal segment are normal. Right Ventricle: The right ventricular size is normal. No increase in right ventricular wall thickness. Right ventricular systolic function is moderately reduced. There is normal pulmonary artery  systolic pressure. The tricuspid regurgitant velocity is 2.18 m/s, and with an assumed right atrial pressure of 3 mmHg, the estimated right ventricular systolic pressure is 22.0 mmHg. Left Atrium: Left atrial size was normal in size. Right Atrium: Right atrial size was normal in size. Pericardium: There is no evidence of pericardial effusion. Mitral Valve: The mitral valve is grossly normal. Mild to moderate mitral valve regurgitation, with eccentric posteriorly directed jet. Tricuspid Valve: The tricuspid valve is grossly normal. Tricuspid valve regurgitation is mild. Aortic Valve: The aortic valve is tricuspid. Aortic valve regurgitation is not visualized. Pulmonic Valve: The pulmonic valve was grossly normal. Pulmonic valve regurgitation is not visualized. Aorta: The aortic root is normal in size and structure. Venous: The inferior vena cava is normal in size with greater than 50% respiratory variability, suggesting right atrial pressure of 3 mmHg. IAS/Shunts: No atrial level shunt detected by color flow Doppler.  LEFT VENTRICLE PLAX 2D LVIDd:         3.72 cm  Diastology LVIDs:         3.15 cm  LV e' medial:    6.53 cm/s LV PW:         1.00 cm  LV E/e' medial:  9.7 LV IVS:        0.80 cm  LV e' lateral:   6.42 cm/s LVOT diam:     1.80 cm  LV E/e' lateral: 9.9 LVOT Area:     2.54 cm  RIGHT  VENTRICLE RV S prime:     7.29 cm/s LEFT ATRIUM             RIGHT ATRIUM LA diam:        2.20 cm RA Area:     9.12 cm LA Vol (A2C):   24.3 ml RA Volume:   20.90 ml LA Vol (A4C):   28.3 ml LA Biplane Vol: 27.0 ml   AORTA Ao Root diam: 2.40 cm MITRAL VALVE               TRICUSPID VALVE MV Area (PHT): 4.21 cm    TR Peak grad:   19.0 mmHg MV Decel Time: 180 msec    TR Vmax:        218.00 cm/s MV E velocity: 63.40 cm/s MV A velocity: 70.70 cm/s  SHUNTS MV E/A ratio:  0.90        Systemic Diam: 1.80 cm Nona Dell MD Electronically signed by Nona Dell MD Signature Date/Time: 12/03/2020/4:11:56 PM    Final    [place] EKG SITE RITE  Result Date: 12/04/2020 If Site Rite image not attached, placement could not be confirmed due to current cardiac rhythm.  US Abdomen Limited RUQ (LIVER/GB)  Result Date: 11/30/2020 CLINICAL DATA:  Right upper quadrant pain and elevated LFTs EXAM: ULTRASOUND ABDOMEN LIMITED RIGHT UPPER QUADRANT COMPARISON:  None. FINDINGS: Gallbladder: Gallbladder is well distended. Mild gallbladder sludge is noted. No gallstones are seen. Common bile duct: Diameter: 3.2 mm. Liver: Mild increased echogenicity consistent with fatty infiltration. No mass lesion is noted. Portal vein is patent on color Doppler imaging with normal direction of blood flow towards the liver. Other: None. IMPRESSION: Fatty infiltration of the liver. Gallbladder sludge.  No cholelithiasis or wall thickening is noted. Electronically Signed   By: Alcide Clever M.D.   On: 11/30/2020 12:50   US THORACENTESIS ASP PLEURAL SPACE W/IMG GUIDE  Result Date: 12/01/2020 INDICATION: COVID-19 pneumonia, sepsis, small RIGHT pleural effusion EXAM: ULTRASOUND GUIDED DIAGNOSTIC RIGHT THORACENTESIS MEDICATIONS: None. COMPLICATIONS: None  immediate. PROCEDURE: An ultrasound guided thoracentesis was thoroughly discussed with the patient and questions answered. The benefits, risks, alternatives and complications were also discussed. The  patient understands and wishes to proceed with the procedure. Written consent was obtained. Ultrasound was performed to localize and mark an adequate pocket of fluid in the RIGHT chest. The area was then prepped and draped in the normal sterile fashion. 1% Lidocaine was used for local anesthesia. Under ultrasound guidance a a syringe with a 22 gauge needle was advanced into the small RIGHT pleural effusion. 16 cc of fluid was performed. The catheter was removed and a dressing applied. FINDINGS: A total of approximately 16 mL of slightly cloudy yellow fluid was removed. Sample was sent to the laboratory as requested by the clinical team. IMPRESSION: Successful ultrasound guided right thoracentesis yielding 16 mL of pleural fluid. Electronically Signed   By: Ulyses Southward M.D.   On: 12/01/2020 14:24    Catarina Hartshorn, DO  [facility]  If 7PM-7AM, please contact night-coverage [email or web address] Password [facility] 12/11/2020, 11:35 AM   LOS: 11 days

## 2020-12-11 NOTE — Progress Notes (Signed)
Physical Therapy Treatment Patient Details Name: [name] MRN: [id number] DOB: March 27, 1986 Today's Date: 12/11/2020    History of Present Illness [name] is a 36 y.o. female with medical history significant for type 1 diabetes, GERD, and cocaine use who presented to the ED via EMS for evaluation of weakness and significant hypoxemia.  She was noted to be 66% on pulse oximetry on room air and blood sugar was noted to be greater than 600.  She has a history of prior episodes of DKA and actively uses cocaine, but denies any IV drug use.  She presented to the ED approximately 2 days ago and at that time was having some nausea and vomiting was found to have pneumonia for which she was discharged on cefdinir and Zithromax.  She has been compliant with her antibiotics, but has had worsening cough as well as shortness of breath and weakness and now nausea/vomiting and diarrhea as well.  She is vaccinated for COVID, but does not have booster.    PT Comments    Patient demonstrates improvement for sitting up at bedside with HOB flat, increased tolerance/distance for ambulation in room, had one near fall when attempting 1 handed assist, had to be lowered to floor, then able to stand from floor without problem, demonstrates good return for using RW without loss of balance and encouraged to ask for help from nursing staff when needing to transfer to commode or go back to bed.  Patient tolerated sitting up in chair to continuing working with OT after physical therapy.  Patient will benefit from continued physical therapy in hospital and recommended venue below to increase strength, balance, endurance for safe ADLs and gait.    Follow Up Recommendations  Supervision for mobility/OOB;Supervision - Intermittent;Home health PT     Equipment Recommendations  Other (comment) (Rollator walker)    Recommendations for Other Services       Precautions / Restrictions Precautions Precautions:  Fall Restrictions Weight Bearing Restrictions: No    Mobility  Bed Mobility Overal bed mobility: Needs Assistance Bed Mobility: Supine to Sit;Sit to Supine     Supine to sit: Min guard Sit to supine: Supervision   General bed mobility comments: slow labored movement with bed flat    Transfers Overall transfer level: Needs assistance Equipment used: Rolling walker (2 wheeled);1 person hand held assist Transfers: Sit to/from [employer] Sit to Stand: Min guard Stand pivot transfers: Min guard       General transfer comment: Min assist with hand held assist, Min guard using RW  Ambulation/Gait Ambulation/Gait assistance: Min assist Gait Distance (Feet): 35 Feet Assistive device: Rolling walker (2 wheeled);1 person hand held assist Gait Pattern/deviations: Decreased step length - right;Decreased step length - left;Decreased stride length Gait velocity: decreased   General Gait Details: slow labored unsteady cadence with ataxic like gait, 1 near fall having to be lowered to floor when walking with hand held assist, improved balance using RW without loss of balance   Stairs             Wheelchair Mobility    Modified Rankin (Stroke Patients Only)       Balance Overall balance assessment: Needs assistance Sitting-balance support: Feet supported;No upper extremity supported Sitting balance-Leahy Scale: Good Sitting balance - Comments: seated at EOB   Standing balance support: During functional activity;No upper extremity supported Standing balance-Leahy Scale: Poor Standing balance comment: fair/poor without AD, fair/good using RW  Cognition Arousal/Alertness: Awake/alert Behavior During Therapy: WFL for tasks assessed/performed Overall Cognitive Status: Within Functional Limits for tasks assessed                                        Exercises General Exercises - Lower Extremity Long  Arc Quad: Seated;AROM;Strengthening;Both;10 reps Hip Flexion/Marching: Seated;AROM;Strengthening;Both;10 reps Toe Raises: Seated;AROM;Strengthening;Both;15 reps Heel Raises: Seated;AROM;Strengthening;Both;15 reps Shoulder Exercises Shoulder Flexion: AROM;10 reps Shoulder ABduction: AROM;10 reps (Horizontal ABD, Protraction x10 each. Completed seated in chair.)    General Comments        Pertinent Vitals/Pain Pain Assessment: No/denies pain    Home Living                      Prior Function            PT Goals (current goals can now be found in the care plan section) Acute Rehab PT Goals Patient Stated Goal: return home with family to assist Time For Goal Achievement: 12/21/20 Potential to Achieve Goals: Good Progress towards PT goals: Progressing toward goals    Frequency    Min 3X/week      PT Plan Current plan remains appropriate    Co-evaluation PT/OT/SLP Co-Evaluation/Treatment: Yes Reason for Co-Treatment: Complexity of the patient's impairments (multi-system involvement);For patient/therapist safety PT goals addressed during session: Mobility/safety with mobility;Balance;Proper use of DME;Strengthening/ROM        AM-PAC PT "6 Clicks" Mobility   Outcome Measure  Help needed turning from your back to your side while in a flat bed without using bedrails?: None Help needed moving from lying on your back to sitting on the side of a flat bed without using bedrails?: A Little Help needed moving to and from a bed to a chair (including a wheelchair)?: A Little Help needed standing up from a chair using your arms (e.g., wheelchair or bedside chair)?: A Little Help needed to walk in hospital room?: A Little Help needed climbing 3-5 steps with a railing? : A Lot 6 Click Score: 18    End of Session   Activity Tolerance: Patient tolerated treatment well;Patient limited by fatigue Patient left: in chair;with call bell/phone within reach;Other (comment)  (working with OT) Nurse Communication: Mobility status PT Visit Diagnosis: Unsteadiness on feet (R26.81);Other abnormalities of gait and mobility (R26.89);Muscle weakness (generalized) (M62.81)     Time: 4315-4008 PT Time Calculation (min) (ACUTE ONLY): 35 min  Charges:  $Gait Training: 8-22 mins $Therapeutic Exercise: 8-22 mins                     10:55 AM, 12/11/20 Ocie Bob, MPT Physical Therapist with [facility] [phone] office [phone] mobile phone

## 2020-12-11 NOTE — TOC Benefit Eligibility Note (Addendum)
Transition of Care [facility]) Benefit Eligibility Note    Patient Details  Name: [name] MRN: [id number] Date of Birth: 11-05-1985   Medication/Dose: zyvox 600mg  bid, 30 day supply  Covered?: Yes   no prior auth required   tier level not provided  Spoke with Person/Company/Phone Number:: [phone] - [phone]  [phone] with Morrie Sheldon, [facility] at [phone]  Co-Pay: $3             [phone] Phone Number: 12/11/2020, 2:08 PM

## 2020-12-11 NOTE — Progress Notes (Signed)
  Speech Language Pathology Treatment: Dysphagia  Patient Details Name: [name] MRN: [id number] DOB: 11-16-1985 Today's Date: 12/11/2020 Time: 1275-1700 SLP Time Calculation (min) (ACUTE ONLY): 28 min  Assessment / Plan / Recommendation Clinical Impression  Ongoing diagnostic dysphagia therapy provided today; Pt continues to be alert today and prior to trials she independently repositioned herself in the bed. Pt's dentures were in prior to SLP arrival. Note Pt had consumed her breakfast tray in its entirety prior to SLP arrival. With regular textures, Pt continues to demonstrate prolonged mastication, however she reports it normally takes her longer to "chew everything up" really well. Pt consumed thin liquids without s/sx of oropharyngeal dysphagia. Recommend upgrade diet to regular textures and continue with thin liquids. Pt's mentation appears to be back to baseline, ST will sign off at this time. Thank you for allowing [place] to care for this patient.    HPI HPI: [name] was admitted to the hospital with the working diagnosis of acute hypoxemic respiratory failure due to SARS COVID 19 viral infection, complicated with bacterial pneumonia (parapneumonic effusion/ empyema) with MRSA bacteremia and septic shock. 36 yo female with past medical history of T1 DM, GERD, and cocaine use who presented with weakness and hypoxemia. Recently diagnosed with community acquired pneumonia about 2 days prior to admission, prescribed cefdinir and azithromycin. Her symptoms worsened with with progressive dyspnea, weakness, nausea and vomiting. EMS was called and her oxymetry was found to be 66% and glucose 600 mg/dl. She was transported to the ED.  On her initial physical examination BP 110/79, HR 103, T 94.6 and oxymetry 86 to 89%. Lungs with no wheezing or rhonchi, heart S1-S2 present, abdomen soft and non tender, no lower extremity edema. She was intubated 2/14-2/17/22. BSE requested.      SLP Plan   Continue with current plan of care       Recommendations  Diet recommendations: Regular;Thin liquid Liquids provided via: Cup Medication Administration: Whole meds with puree Supervision: Patient able to self feed Compensations: Slow rate;Small sips/bites Postural Changes and/or Swallow Maneuvers: Seated upright 90 degrees;Upright 30-60 min after meal                Oral Care Recommendations: Oral care BID;Staff/trained caregiver to provide oral care Follow up Recommendations: None SLP Visit Diagnosis: Dysphagia, unspecified (R13.10) Plan: Continue with current plan of care       GO              [name], CCC-SLP Speech Language Pathologist   [name] 12/11/2020, 10:26 AM

## 2020-12-12 DIAGNOSIS — A419 Sepsis, unspecified organism: Secondary | ICD-10-CM | POA: Diagnosis not present

## 2020-12-12 DIAGNOSIS — R6521 Severe sepsis with septic shock: Secondary | ICD-10-CM | POA: Diagnosis not present

## 2020-12-12 DIAGNOSIS — U071 COVID-19: Secondary | ICD-10-CM | POA: Diagnosis not present

## 2020-12-12 DIAGNOSIS — E1010 Type 1 diabetes mellitus with ketoacidosis without coma: Secondary | ICD-10-CM | POA: Diagnosis not present

## 2020-12-12 LAB — GLUCOSE, CAPILLARY
Glucose-Capillary: 98 mg/dL (ref 70–99)
Glucose-Capillary: 143 mg/dL — ABNORMAL HIGH (ref 70–99)
Glucose-Capillary: 272 mg/dL — ABNORMAL HIGH (ref 70–99)
Glucose-Capillary: 224 mg/dL — ABNORMAL HIGH (ref 70–99)

## 2020-12-12 LAB — CBC
HCT: 29.1 % — ABNORMAL LOW (ref 36.0–46.0)
Hemoglobin: 9.5 g/dL — ABNORMAL LOW (ref 12.0–15.0)
MCH: 28.4 pg (ref 26.0–34.0)
MCHC: 32.6 g/dL (ref 30.0–36.0)
MCV: 86.9 fL (ref 80.0–100.0)
Platelets: 254 10*3/uL (ref 150–400)
RBC: 3.35 MIL/uL — ABNORMAL LOW (ref 3.87–5.11)
RDW: 16 % — ABNORMAL HIGH (ref 11.5–15.5)
WBC: 10.7 10*3/uL — ABNORMAL HIGH (ref 4.0–10.5)
nRBC: 0 % (ref 0.0–0.2)

## 2020-12-12 LAB — COMPREHENSIVE METABOLIC PANEL
ALT: 44 U/L (ref 0–44)
AST: 22 U/L (ref 15–41)
Albumin: 2.3 g/dL — ABNORMAL LOW (ref 3.5–5.0)
Alkaline Phosphatase: 213 U/L — ABNORMAL HIGH (ref 38–126)
Anion gap: 10 (ref 5–15)
BUN: 13 mg/dL (ref 6–20)
CO2: 33 mmol/L — ABNORMAL HIGH (ref 22–32)
Calcium: 8.3 mg/dL — ABNORMAL LOW (ref 8.9–10.3)
Chloride: 95 mmol/L — ABNORMAL LOW (ref 98–111)
Creatinine, Ser: 0.35 mg/dL — ABNORMAL LOW (ref 0.44–1.00)
GFR, Estimated: >60 mL/min (ref >60)
Glucose, Bld: 59 mg/dL — ABNORMAL LOW (ref 70–99)
Potassium: 2.5 mmol/L — CL (ref 3.5–5.1)
Sodium: 138 mmol/L (ref 135–145)
Total Bilirubin: 0.3 mg/dL (ref 0.3–1.2)
Total Protein: 5.6 g/dL — ABNORMAL LOW (ref 6.5–8.1)

## 2020-12-12 LAB — MAGNESIUM: Magnesium: 1.9 mg/dL (ref 1.7–2.4)

## 2020-12-12 MED ORDER — POTASSIUM CHLORIDE 10 MEQ/100ML IV SOLN
10.0000 meq | INTRAVENOUS | Status: AC
  Administered 2020-12-12 (×2): 10 meq via INTRAVENOUS
  Filled 2020-12-12 (×2): qty 100

## 2020-12-12 MED ORDER — PREDNISONE 20 MG PO TABS
40.0000 mg | ORAL_TABLET | Freq: Every day | ORAL | Status: DC
  Administered 2020-12-13: 40 mg via ORAL
  Filled 2020-12-12: qty 2

## 2020-12-12 MED ORDER — POTASSIUM CHLORIDE CRYS ER 20 MEQ PO TBCR
40.0000 meq | EXTENDED_RELEASE_TABLET | Freq: Three times a day (TID) | ORAL | Status: AC
  Administered 2020-12-12 (×2): 40 meq via ORAL
  Filled 2020-12-12 (×2): qty 2

## 2020-12-12 MED ORDER — INSULIN GLARGINE 100 UNIT/ML ~~LOC~~ SOLN
20.0000 [IU] | Freq: Every evening | SUBCUTANEOUS | Status: DC
  Filled 2020-12-12 (×3): qty 0.2

## 2020-12-12 MED ORDER — CARVEDILOL 3.125 MG PO TABS
3.1250 mg | ORAL_TABLET | Freq: Two times a day (BID) | ORAL | Status: DC
  Administered 2020-12-12 – 2020-12-13 (×2): 3.125 mg via ORAL
  Filled 2020-12-12 (×2): qty 1

## 2020-12-12 NOTE — Progress Notes (Signed)
Pt ambulated on unit with front wheel walker this shift without any complications noted, walked from room to nursing station. Denies SOB.

## 2020-12-12 NOTE — Progress Notes (Signed)
PROGRESS NOTE  [name] JQB:[id number] DOB: 03/30/1986 DOA: 11/30/2020 PCP: [facility]  Brief History: Mrs. Schull was admitted to the hospital with the working diagnosis of acute hypoxemic respiratory failure due to SARS COVID 19 viral infection, complicated with bacterial pneumonia(parapneumonic effusion/ empyema)with MRSA bacteremia and septic shock.   35 yo female with past medical history of T1 DM, GERD, and cocaine use who presented withweakness and hypoxemia. Recently diagnosed with community acquired pneumonia about 2 days prior to admission, prescribed cefdinir and azithromycin. Her symptoms worsened with with progressive dyspnea, weakness, nausea and vomiting. EMS was called and her oxymetry was found to be 66% and glucose 600 mg/dl. She was transported to the ED. On her initial physical examination BP 110/79, HR 103, T 94.6 and oxymetry 86 to 89%. Lungs with no wheezing or rhonchi, heart S1-S2 present, abdomen soft and non tender, no lower extremity edema.  Sodium 123, potassium 3.1, chloride 82, bicarb 11, glucose 1623, creatinine 1.15, anion gap 30, magnesium 2.9, AST 246, ALT 299, total bilirubins 1.7. White cell count 5.3, hemoglobin 12.2, hematocrit 39.9, platelets 273. SARS COVID-19 positive. Urinalysis>500 glucose, 0-5 white cells, specific gravity 1.022. Drug screen negative.  Chest radiograph with bilateral multilobar infiltrates, predominantly left lower lobe, right lower lobe, right upper lobe. Platelike atelectasis left midlung.  EKG 101 bpm, rightward axis, QTC 500, sinus rhythm, Q-wave 83, aVF, V4-V6, no significant ST segment T wave changes.  Patient was treated with systemic steroids and remdesivir.  She had high oxygen requirements, from heated high flow nasal cannula to non invasive mechanical ventilation and eventually to invasive mechanical ventilation 11/30/20   She did not received tocilizumab due to  concomitant bacterial infection.  Diagnosed with parapneumonic effusion/ empyema, bacterial pneumonia over infection (MRSA).  Her condition deteriorated into shock requiring vasopressors.   Liberated from mechanical ventilation on 12/03/2020, placed on high flow nasal cannula. CT surgery recommended not invasive intervention, since effusions had decreased in size.   Slowly decreasing oxygen requirements but patient very weak and deconditioned.  Barrier to d/c remains to be confusion, agitation and interference with medical therapy. Precedex restarted on 12/08/20   Assessment/Plan: Acute hypoxemic respiratory failure due to SARS COVID 19 viral pneumonia, -now stable on RA -completed 5 days remdesivir -no tocilizumab or baricitinib due to concomitant bacterial infection -inflammatory markers overall trending down -continuing solumedrol 40 mg IV daily  bacterial pneumoniasuper-infection (MRSA) with para-pneumonic effusion/ empyema. Septic shock. -sepsis present on admission -BP now stable -12/01/20--Pleural fluid culture positive for MRSA. Blood cultures with no growth. -Transthoracic echocardiogram with no vegetations. (no tocilizumab or baricitinib due to concomitant bacterial infection) -stable on 2L>>RA -WBC improving  PCCM was following, discussed case with cardiothoracic surgery who did not recommend surgical invention at this time and favored prolonged antibiotic course. On methylprednisolone 40 mg IV daily, continue antibiotic therapy with IV vancomycin/ metronidazole. Dr. Ella Jubilee consulted Dr Earlene Plater ID (phone) for duration of antibiotic therapy, recommendations from 2 to 3 weeks of IV vancomycin or orallinezolid. Sheisnoted to not be a good candidate for IV antibiotics on discharge due to her history of substance abuse   Acute systolic heart failure.  2/17/22EF 30 -35% with akinetic, mid and distal anterior wall, mid and distal anterior septum, entire  apex, mid and distal inferior wall and mid inferoseptal segment.   -Continuefurosemide 20 mg Iv for diuresis, -personally reviewedChest x-ray from 2/22 shows persistent pulmonary edema and small bilateral pleural effusions. -Continue diuresis  to target negative fluid balance. -Holding on b blocker and acei due to soft BPs -will ultimately need outpt cards work up  Hyperkalemia/ hypokalemia/ hyponatremia. -Na improving with diuresis -K repleted  Acute metabolic encephalopathy (hx of substance abuse). -Patient was transiently on Precedex infusion, but was weaned off on 2/20 -continued to have intermittent episodes of agitation/confusion where she is trying to get out of bed Lorazepam has not had any significant impact on her agitation -Since it was felt that she would be a risk to herself, she has been started back on low-dose Precedexon 12/08/20 -Continue with aspiration and fall precautions. -On multivitamins and thiamine. -Continue out of bed to chair tid with meals as tolerated, PT and OT. -speech evaluation, for now continue with dysphagia2diet.   Gabapentin dose has been decreased PRN lorazepam for agitation. -12/11/20--weaned off precedex  UncontrolledT1DM with DKAand hyperglycemia. -DKA now resolved -Patient with very poor oral intake. -11/30/20 A1C--14.7 -increase lantus to 25 units>>20  Acute anemia and thrombocytopenia. -Hgb8.8and hct 26.  -She does not have any evidence of obvious bleeding -iron saturation 11%, ferritin 198 -B12 3026, folate 8.1  Generalized weakness -een by physical therapy with recommendation for skilled nursing facility placement -Continue physical therapy while in hospital  Hypomagnesemia -replete    Status is: Inpatient  Remains inpatient appropriate because:IV treatments appropriate due to intensity of illness or inability to take PO   Dispo: The patient is from:Home Anticipated d/c  is to:SNF Anticipated d/c date is: 3 days Patient currently Not medically stable for d/c Difficult to place patient Yes        Family Communication:spouse updated 2/26  Consultants:PCCM  Code Status: FULL  DVT Prophylaxis:  Lovenox   Procedures: As Listed in Progress Note Above  Antibiotics: Vanco 2/15>>       Subjective: Patient denies fevers, chills, headache, chest pain, dyspnea, nausea, vomiting, diarrhea, abdominal pain, dysuria, hematuria, hematochezia, and melena.   Objective: Vitals:   12/11/20 1841 12/11/20 2242 12/12/20 0606 12/12/20 1419  BP: 139/78 132/80 125/89 132/86  Pulse: (!) 113 93 90 92  Resp: 16 20 18 20   Temp:  98.7 F (37.1 C) 98.1 F (36.7 C) 99 F (37.2 C)  TempSrc:  Oral Oral Oral  SpO2: 94% 98% 93% 96%  Weight:  39.3 kg    Height:  5' (1.524 m)      Intake/Output Summary (Last 24 hours) at 12/12/2020 1704 Last data filed at 12/11/2020 1800 Gross per 24 hour  Intake 240 ml  Output --  Net 240 ml   Weight change:  Exam:   General:  Pt is alert, follows commands appropriately, not in acute distress  HEENT: No icterus, No thrush, No neck mass, [place]/AT  Cardiovascular: RRR, S1/S2, no rubs, no gallops  Respiratory: diminished BS at the bases.  No wheeze  Abdomen: Soft/+BS, non tender, non distended, no guarding  Extremities: No edema, No lymphangitis, No petechiae, No rashes, no synovitis   Data Reviewed: I have personally reviewed following labs and imaging studies Basic Metabolic Panel: Recent Labs  Lab 12/08/20 0448 12/09/20 0524 12/10/20 0413 12/11/20 0519 12/12/20 0522  NA 137 141 137 133* 138  K 3.3* 3.7 3.8 3.5 2.5*  CL 96* 100 96* 95* 95*  CO2 34* 33* 33* 31 33*  GLUCOSE 151* 179* 302* 421* 59*  BUN <5* 6 12 11 13   CREATININE 0.34* 0.34* 0.42* 0.52 0.35*  CALCIUM 7.7* 7.9* 8.0* 7.8* 8.3*  MG 1.5* 1.9 1.9 1.5*  --  Liver Function  Tests: Recent Labs  Lab 12/06/20 0316 12/09/20 0524 12/10/20 0413 12/11/20 0519 12/12/20 0522  AST 61* 66* 34 27 22  ALT 110* 76* 62* 50* 44  ALKPHOS 321* 273* 292* 245* 213*  BILITOT 1.4* 0.8 0.6 0.4 0.3  PROT 5.1* 5.1* 5.2* 5.3* 5.6*  ALBUMIN 2.0* 2.0* 2.1* 2.1* 2.3*   No results for input(s): LIPASE, AMYLASE in the last 168 hours. No results for input(s): AMMONIA in the last 168 hours. Coagulation Profile: No results for input(s): INR, PROTIME in the last 168 hours. CBC: Recent Labs  Lab 12/06/20 1305 12/08/20 0448 12/09/20 0524 12/11/20 0519 12/12/20 0522  WBC 18.8* 11.9* 7.6 8.6 10.7*  NEUTROABS 17.8* 9.1*  --   --   --   HGB 10.3* 8.8* 9.3* 9.5* 9.5*  HCT 31.0* 26.3* 28.6* 29.2* 29.1*  MCV 87.1 84.8 87.7 87.7 86.9  PLT 139* 122* 133* 211 254   Cardiac Enzymes: No results for input(s): CKTOTAL, CKMB, CKMBINDEX, TROPONINI in the last 168 hours. BNP: Invalid input(s): POCBNP CBG: Recent Labs  Lab 12/11/20 1142 12/11/20 1632 12/11/20 2105 12/12/20 0735 12/12/20 1144  GLUCAP 330* 216* 135* 98 143*   HbA1C: No results for input(s): HGBA1C in the last 72 hours. Urine analysis:    Component Value Date/Time   COLORURINE STRAW (A) 11/30/2020 0949   APPEARANCEUR CLEAR 11/30/2020 0949   LABSPEC 1.022 11/30/2020 0949   PHURINE 5.0 11/30/2020 0949   GLUCOSEU >=500 (A) 11/30/2020 0949   HGBUR MODERATE (A) 11/30/2020 0949   BILIRUBINUR NEGATIVE 11/30/2020 0949   KETONESUR 20 (A) 11/30/2020 0949   PROTEINUR NEGATIVE 11/30/2020 0949   UROBILINOGEN 1.0 07/28/2015 1402   NITRITE NEGATIVE 11/30/2020 0949   LEUKOCYTESUR NEGATIVE 11/30/2020 0949   Sepsis Labs: @LABRCNTIP (procalcitonin:4,lacticidven:4) )No results found for this or any previous visit (from the past 240 hour(s)).   Scheduled Meds: . (feeding supplement) PROSource Plus  30 mL Oral BID BM  . Chlorhexidine Gluconate Cloth  6 each Topical Daily  . enoxaparin (LOVENOX) injection  40 mg Subcutaneous  Q24H  . famotidine  20 mg Oral BID  . feeding supplement (GLUCERNA SHAKE)  237 mL Oral QID  . furosemide  20 mg Intravenous Daily  . gabapentin  300 mg Oral TID  . insulin aspart  0-15 Units Subcutaneous TID WC  . insulin aspart  0-5 Units Subcutaneous QHS  . insulin aspart  6 Units Subcutaneous TID WC  . insulin glargine  25 Units Subcutaneous QPM  . methylPREDNISolone (SOLU-MEDROL) injection  40 mg Intravenous Daily  . multivitamin  15 mL Oral Daily  . nicotine  21 mg Transdermal Daily  . sodium chloride flush  10-40 mL Intracatheter Q12H  . thiamine  100 mg Oral Daily   Continuous Infusions: . metronidazole 500 mg (12/12/20 1201)  . vancomycin 1,000 mg (12/12/20 1638)    Procedures/Studies: CT CHEST WO CONTRAST  Result Date: 12/03/2020 CLINICAL DATA:  COVID pneumonia. EXAM: CT CHEST WITHOUT CONTRAST TECHNIQUE: Multidetector CT imaging of the chest was performed following the standard protocol without IV contrast. COMPARISON:  Chest CT 05/18/2017 FINDINGS: Cardiovascular: The heart is normal in size. No pericardial effusion. The endotracheal tube is in good position just above the carina. There is an NG tube coursing down the esophagus and into the stomach. The right IJ central venous catheter is in good position. Mediastinum/Nodes: Borderline enlarged mediastinal and hilar lymph nodes likely reactive/inflammatory. Lungs/Pleura: Underlying emphysematous changes are noted with superimposed severe interstitial process in the upper  lobes with areas of cystic change which could be bronchiectasis or pneumatocele see. There is fairly dense airspace consolidation in both lower lobes with suspected areas of mild cavitation most notably in the right lower lobe medially. There are also small bilateral pleural effusions, right larger than left. No pneumothorax or pneumomediastinum. Upper Abdomen: Upper abdominal ascites is noted with fluid around the gallbladder, in the hepato renal fossa and pericolic  gutters. Musculoskeletal: No significant bony findings. IMPRESSION: 1. Underlying emphysematous changes with superimposed severe interstitial process in the upper lobes with areas of cystic change which could be bronchiectasis or pneumatoceles. 2. Dense airspace consolidation in both lower lobes with suspected areas of mild cavitation most notably in the right lower lobe medially. 3. Small bilateral pleural effusions, right larger than left. 4. Upper abdominal ascites. 5. Lines and tubes in good position without complicating features. Emphysema (ICD10-J43.9). Electronically Signed   By: Rudie Meyer M.D.   On: 12/03/2020 13:20   CT ABDOMEN PELVIS W CONTRAST  Result Date: 11/28/2020 CLINICAL DATA:  Abdominal pain, biliary obstruction suspected. EXAM: CT ABDOMEN AND PELVIS WITH CONTRAST TECHNIQUE: Multidetector CT imaging of the abdomen and pelvis was performed using the standard protocol following bolus administration of intravenous contrast. CONTRAST:  OMNIPAQUE IOHEXOL 300 MG/ML  SOLN COMPARISON:  CT abdomen pelvis 04/10/2017, CT angio chest 05/18/2017 FINDINGS: Lower chest: Partially visualized trace right pleural effusion. Associated passive atelectasis of the right lower lobe. Dependent right lower lobe consolidation. Masslike right upper and right lower lobe consolidations (4:3, 5:33). Hepatobiliary: The liver measures at upper limits of normal in size. No focal liver abnormality. No gallstones, gallbladder wall thickening, or pericholecystic fluid. No biliary dilatation. Pancreas: No focal lesion. Normal pancreatic contour. No surrounding inflammatory changes. No main pancreatic ductal dilatation. Spleen: Normal in size without focal abnormality. Adrenals/Urinary Tract: No adrenal nodule bilaterally. Bilateral kidneys enhance symmetrically. No hydronephrosis. No hydroureter. The urinary bladder is unremarkable. Stomach/Bowel: Fluid level within the gastric lumen likely related to ingested material.  Stomach is within normal limits. Prominent but not dilated loop of small bowel within the lower anterior abdomen with no definite transition point. No pneumatosis. No evidence of bowel wall thickening or dilatation. Appendix appears normal. Vascular/Lymphatic: No abdominal aorta or iliac aneurysm. 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 No abdominal, pelvic, or inguinal lymphadenopathy. 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 Uterus and bilateral adnexa are unremarkable. Other: Trace simple free fluid within the pelvis that could be physiologic in etiology. No intraperitoneal free gas. No organized fluid collection. Musculoskeletal: No acute or significant osseous findings. IMPRESSION: 1. Dependent right lower lobe consolidation. Masslike right upper and right lower lobe consolidation. Findings likely represents infection/inflammation. Recommend repeat chest x-ray PA and lateral in 3-4 weeks to exclude underlying malignancy. 2. Trace volume right pleural effusion. 3. 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 Electronically Signed   By: Tish Frederickson M.D.   On: 11/28/2020 01:35   DG Chest Port 1 View  Result Date: 12/07/2020 CLINICAL DATA:  Dyspnea. EXAM: PORTABLE CHEST 1 VIEW COMPARISON:  December 05, 2020. FINDINGS: Improved diffuse interstitial opacities with slight worsening of dense left basilar consolidation. Small bilateral pleural effusions, likely slightly increased on the right. No visible pneumothorax. Cardiomediastinal silhouette is within normal limits. IMPRESSION: 1. Improved diffuse interstitial opacities with slight worsening of dense left basilar consolidation. 2. Small bilateral pleural effusions, likely slightly increased on the right. Electronically Signed   By: Thornton Dales  Yetta Barre MD   On: 12/07/2020 13:42   DG Chest Port 1 View  Result Date: 12/05/2020 CLINICAL DATA:  COVID positive with severe  MRSA septic shock EXAM: PORTABLE CHEST 1 VIEW COMPARISON:  12/02/2020 FINDINGS: Right arm PICC line tip is at the cavoatrial junction. Interval removal of NG tube and ET tube. Heart size is normal. Bilateral scratch set small bilateral pleural effusions are again noted. Diffuse interstitial and airspace densities appear unchanged from previous exam. IMPRESSION: 1. Persistent bilateral pleural effusions and bilateral interstitial and airspace opacities compatible with multifocal atypical viral pneumonia. 2. Stable appearance of the right arm PICC line. Electronically Signed   By: Signa Kell M.D.   On: 12/05/2020 07:01   DG CHEST PORT 1 VIEW  Result Date: 12/02/2020 CLINICAL DATA:  Intubated, COVID EXAM: PORTABLE CHEST 1 VIEW COMPARISON:  12/01/2020 FINDINGS: Endotracheal tube is 3 cm above the carina. NG tube is in the stomach. Layering bilateral pleural effusions. Interstitial prominence throughout the lungs. Bilateral lower lobe airspace disease. No real change since prior study. IMPRESSION: Persistent interstitial prominence and pulmonary infiltrates. Layering bilateral effusions. No change. Electronically Signed   By: Charlett Nose M.D.   On: 12/02/2020 06:33   DG Chest Portable 1 View  Result Date: 12/01/2020 CLINICAL DATA:  RIGHT pleural effusion, COVID-19, sepsis EXAM: PORTABLE CHEST 1 VIEW COMPARISON:  Portable exam at 1345 hrs compared to 0921 hrs FINDINGS: Tip of endotracheal tube projects 1.9 cm above carina. NG tube extends into stomach. Stable heart size and mediastinal contours normal. Persistent diffuse pulmonary infiltrates unchanged. Persistent small RIGHT pleural effusion. No pneumothorax following RIGHT thoracentesis. IMPRESSION: No pneumothorax following diagnostic RIGHT thoracentesis. Persistent pulmonary infiltrates and small RIGHT pleural effusion. Electronically Signed   By: Ulyses Southward M.D.   On: 12/01/2020 14:20   DG CHEST PORT 1 VIEW  Result Date: 12/01/2020 CLINICAL DATA:   35 year old ventilated female status post central line placement. EXAM: PORTABLE CHEST 1 VIEW COMPARISON:  Chest x-ray 11/30/2020. FINDINGS: An endotracheal tube is in place with tip 3.3 cm above the carina. There is a right-sided internal jugular central venous catheter with tip terminating in the distal superior vena cava. Widespread areas of ill-defined opacities and areas of interstitial prominence are noted throughout the lungs bilaterally, most evident throughout the mid to lower lungs, minimally improved compared to the prior examination. Moderate right and small left pleural effusions. No pneumothorax. Pulmonary vasculature is largely obscured. Heart size is normal. The patient is rotated to the left on today's exam, resulting in distortion of the mediastinal contours and reduced diagnostic sensitivity and specificity for mediastinal pathology. IMPRESSION: 1. Support apparatus, as above. 2. The appearance of the lungs is most compatible with severe multilobar bilateral pneumonia related to known COVID infection. Overall, aeration has slightly improved compared to the prior study. 3. Moderate right and small left pleural effusions similar to the prior examination. Electronically Signed   By: Trudie Reed M.D.   On: 12/01/2020 09:37   DG CHEST PORT 1 VIEW  Result Date: 11/30/2020 CLINICAL DATA:  COVID-19 positive, tobacco abuse, intubated EXAM: PORTABLE CHEST 1 VIEW COMPARISON:  11/30/2020, 11/28/2020 FINDINGS: Single frontal view of the chest demonstrates interval placement of endotracheal tube overlying tracheal air column, tip 1.7 cm above carina. Enteric catheter passes below diaphragm tip excluded by collimation, side port projecting over the gastric body. Multifocal bilateral ground-glass airspace disease has progressed since prior study, which could reflect progressive pneumonia or superimposed edema. Stable right pleural effusion. No pneumothorax. IMPRESSION:  1. Support devices as above. 2.  Progressive diffuse ground-glass airspace disease, which may reflect worsening pneumonia or superimposed edema. 3. Stable right pleural effusion. Electronically Signed   By: Sharlet Salina M.D.   On: 11/30/2020 20:07   DG Chest Port 1 View  Result Date: 11/30/2020 CLINICAL DATA:  Shortness of breath, recent pneumonia in a 35 year old female EXAM: PORTABLE CHEST 1 VIEW COMPARISON:  November 28, 2020 FINDINGS: Interstitial and airspace disease worse than on the prior study, superimposed on a background of pulmonary emphysema. Interval development of a small RIGHT-sided pleural effusion. Trachea midline. Cardiomediastinal contours and hilar structures are stable. Nodular component to the airspace process with focal areas of nodularity the for example in the RIGHT upper and LEFT lower lobe. No acute skeletal process on limited assessment. IMPRESSION: 1. Worsening interstitial and airspace disease superimposed on a background of pulmonary emphysema. Findings suspicious for multifocal pneumonia. Given nodular component would suggest follow-up to ensure resolution and exclude underlying pulmonary nodules. 2. New small RIGHT-sided pleural effusion. Could also consider the possibility of an overlay of heart failure. Electronically Signed   By: Donzetta Kohut M.D.   On: 11/30/2020 09:21   DG Chest Port 1 View  Result Date: 11/28/2020 CLINICAL DATA:  Nausea, vomiting, fever and cough. EXAM: PORTABLE CHEST 1 VIEW COMPARISON:  04/06/2020 FINDINGS: Artifact overlies the chest. Heart size is normal. There are patchy pulmonary infiltrates in both lungs, more extensive on the right than the left, consistent with widespread bronchopneumonia. This could be bacterial or viral. No lobar consolidation or collapse. No pleural effusion. No significant bone finding. IMPRESSION: Widespread bilateral bronchopneumonia, right more extensive than left. This could be bacterial or viral. Electronically Signed   By: Paulina Fusi M.D.   On:  11/28/2020 00:26   ECHOCARDIOGRAM COMPLETE  Result Date: 12/03/2020    ECHOCARDIOGRAM REPORT   Patient Name:   HOLLYANNE SCHLOESSER Date of Exam: 12/03/2020 Medical Rec #:  [id number]         Height: Accession #:    [id number]        Weight: Date of Birth:  01/07/1986         BSA: Patient Age:    34 years          BP:           120/89 mmHg Patient Gender: F                 HR:           77 bpm. [facility] Procedure: 2D Echo Indications:    Endocarditis I38  History:        Patient has prior history of Echocardiogram examinations, most                 recent 03/09/2019. Signs/Symptoms:Murmur; Risk Factors:Current                 Smoker and Diabetes. Substance induced mood disorder ,                 Pneumonia, Cocaine Abuse.  Sonographer:    Jeryl Columbia RDCS (AE) Referring Phys: [id number] RAKESH V ALVA IMPRESSIONS  1. Left ventricular ejection fraction, by estimation, is 30 to 35%. The left ventricle has moderately decreased function. The left ventricle demonstrates regional wall motion abnormalities (see scoring diagram/findings for description). Left ventricular  diastolic parameters were normal.  2. Right ventricular systolic function is moderately reduced. The right ventricular size is normal. There is normal  pulmonary artery systolic pressure. The estimated right ventricular systolic pressure is 22.0 mmHg.  3. The mitral valve is grossly normal. Mild to moderate mitral valve regurgitation.  4. The aortic valve is tricuspid. Aortic valve regurgitation is not visualized.  5. The inferior vena cava is normal in size with greater than 50% respiratory variability, suggesting right atrial pressure of 3 mmHg.  6. No obvious valvular vegetations. FINDINGS  Left Ventricle: Left ventricular ejection fraction, by estimation, is 30 to 35%. The left ventricle has moderately decreased function. The left ventricle demonstrates regional wall motion abnormalities. The left ventricular internal cavity size was normal  in size. There is no left ventricular hypertrophy. Left ventricular diastolic parameters were normal.  LV Wall Scoring: The mid and distal anterior wall, mid and distal anterior septum, entire apex, mid and distal inferior wall, and mid inferoseptal segment are akinetic. The basal anteroseptal segment, mid inferolateral segment, and mid anterolateral segment are hypokinetic. The basal inferolateral segment, basal anterolateral segment, basal anterior segment, basal inferior segment, and basal inferoseptal segment are normal. Right Ventricle: The right ventricular size is normal. No increase in right ventricular wall thickness. Right ventricular systolic function is moderately reduced. There is normal pulmonary artery systolic pressure. The tricuspid regurgitant velocity is 2.18 m/s, and with an assumed right atrial pressure of 3 mmHg, the estimated right ventricular systolic pressure is 22.0 mmHg. Left Atrium: Left atrial size was normal in size. Right Atrium: Right atrial size was normal in size. Pericardium: There is no evidence of pericardial effusion. Mitral Valve: The mitral valve is grossly normal. Mild to moderate mitral valve regurgitation, with eccentric posteriorly directed jet. Tricuspid Valve: The tricuspid valve is grossly normal. Tricuspid valve regurgitation is mild. Aortic Valve: The aortic valve is tricuspid. Aortic valve regurgitation is not visualized. Pulmonic Valve: The pulmonic valve was grossly normal. Pulmonic valve regurgitation is not visualized. Aorta: The aortic root is normal in size and structure. Venous: The inferior vena cava is normal in size with greater than 50% respiratory variability, suggesting right atrial pressure of 3 mmHg. IAS/Shunts: No atrial level shunt detected by color flow Doppler.  LEFT VENTRICLE PLAX 2D LVIDd:         3.72 cm  Diastology LVIDs:         3.15 cm  LV e' medial:    6.53 cm/s LV PW:         1.00 cm  LV E/e' medial:  9.7 LV IVS:        0.80 cm  LV e'  lateral:   6.42 cm/s LVOT diam:     1.80 cm  LV E/e' lateral: 9.9 LVOT Area:     2.54 cm  RIGHT VENTRICLE RV S prime:     7.29 cm/s LEFT ATRIUM             RIGHT ATRIUM LA diam:        2.20 cm RA Area:     9.12 cm LA Vol (A2C):   24.3 ml RA Volume:   20.90 ml LA Vol (A4C):   28.3 ml LA Biplane Vol: 27.0 ml   AORTA Ao Root diam: 2.40 cm MITRAL VALVE               TRICUSPID VALVE MV Area (PHT): 4.21 cm    TR Peak grad:   19.0 mmHg MV Decel Time: 180 msec    TR Vmax:        218.00 cm/s MV E velocity: 63.40 cm/s MV A velocity:  70.70 cm/s  SHUNTS MV E/A ratio:  0.90        Systemic Diam: 1.80 cm Nona Dell MD Electronically signed by Nona Dell MD Signature Date/Time: 12/03/2020/4:11:56 PM    Final    [place] EKG SITE RITE  Result Date: 12/04/2020 If Site Rite image not attached, placement could not be confirmed due to current cardiac rhythm.  US Abdomen Limited RUQ (LIVER/GB)  Result Date: 11/30/2020 CLINICAL DATA:  Right upper quadrant pain and elevated LFTs EXAM: ULTRASOUND ABDOMEN LIMITED RIGHT UPPER QUADRANT COMPARISON:  None. FINDINGS: Gallbladder: Gallbladder is well distended. Mild gallbladder sludge is noted. No gallstones are seen. Common bile duct: Diameter: 3.2 mm. Liver: Mild increased echogenicity consistent with fatty infiltration. No mass lesion is noted. Portal vein is patent on color Doppler imaging with normal direction of blood flow towards the liver. Other: None. IMPRESSION: Fatty infiltration of the liver. Gallbladder sludge.  No cholelithiasis or wall thickening is noted. Electronically Signed   By: Alcide Clever M.D.   On: 11/30/2020 12:50   US THORACENTESIS ASP PLEURAL SPACE W/IMG GUIDE  Result Date: 12/01/2020 INDICATION: COVID-19 pneumonia, sepsis, small RIGHT pleural effusion EXAM: ULTRASOUND GUIDED DIAGNOSTIC RIGHT THORACENTESIS MEDICATIONS: None. COMPLICATIONS: None immediate. PROCEDURE: An ultrasound guided thoracentesis was thoroughly discussed with the patient and  questions answered. The benefits, risks, alternatives and complications were also discussed. The patient understands and wishes to proceed with the procedure. Written consent was obtained. Ultrasound was performed to localize and mark an adequate pocket of fluid in the RIGHT chest. The area was then prepped and draped in the normal sterile fashion. 1% Lidocaine was used for local anesthesia. Under ultrasound guidance a a syringe with a 22 gauge needle was advanced into the small RIGHT pleural effusion. 16 cc of fluid was performed. The catheter was removed and a dressing applied. FINDINGS: A total of approximately 16 mL of slightly cloudy yellow fluid was removed. Sample was sent to the laboratory as requested by the clinical team. IMPRESSION: Successful ultrasound guided right thoracentesis yielding 16 mL of pleural fluid. Electronically Signed   By: Ulyses Southward M.D.   On: 12/01/2020 14:24    Catarina Hartshorn, DO  [facility]  If 7PM-7AM, please contact night-coverage [email or web address] Password [email or web address] 12/12/2020, 5:04 PM   LOS: 12 days

## 2020-12-12 NOTE — TOC Progression Note (Addendum)
Transition of Care [facility]) - Progression Note    Patient Details  Name: [name] MRN: [id number] Date of Birth: 07-01-1986  [facility] CM/SW Contact  Barry Brunner, LCSW Phone Number: 12/12/2020, 1:44 PM  Clinical Narrative:    Patient was recommended for SNF by PT. Pilot Committee reviewed patient's charged and determined patient is appropriate for HHPT and OT due to medical history. CSW referred patient to [facility]. Denyse Amass with [facility] agreeable to provide [facility] services to patient. TOC to follow.     Barriers to Discharge: Continued Medical Work up  Expected Discharge Plan and Services                                                 Social Determinants of Health (SDOH) Interventions    Readmission Risk Interventions No flowsheet data found.

## 2020-12-13 DIAGNOSIS — U071 COVID-19: Secondary | ICD-10-CM | POA: Diagnosis not present

## 2020-12-13 DIAGNOSIS — E1010 Type 1 diabetes mellitus with ketoacidosis without coma: Secondary | ICD-10-CM | POA: Diagnosis not present

## 2020-12-13 DIAGNOSIS — R6521 Severe sepsis with septic shock: Secondary | ICD-10-CM | POA: Diagnosis not present

## 2020-12-13 DIAGNOSIS — A419 Sepsis, unspecified organism: Secondary | ICD-10-CM | POA: Diagnosis not present

## 2020-12-13 LAB — BASIC METABOLIC PANEL
Anion gap: 8 (ref 5–15)
BUN: 15 mg/dL (ref 6–20)
CO2: 28 mmol/L (ref 22–32)
Calcium: 8.6 mg/dL — ABNORMAL LOW (ref 8.9–10.3)
Chloride: 96 mmol/L — ABNORMAL LOW (ref 98–111)
Creatinine, Ser: 0.5 mg/dL (ref 0.44–1.00)
GFR, Estimated: >60 mL/min (ref >60)
Glucose, Bld: 384 mg/dL — ABNORMAL HIGH (ref 70–99)
Potassium: 4.8 mmol/L (ref 3.5–5.1)
Sodium: 132 mmol/L — ABNORMAL LOW (ref 135–145)

## 2020-12-13 LAB — GLUCOSE, CAPILLARY: Glucose-Capillary: 344 mg/dL — ABNORMAL HIGH (ref 70–99)

## 2020-12-13 MED ORDER — LINEZOLID 600 MG PO TABS: 600.0000 mg | ORAL_TABLET | Freq: Two times a day (BID) | ORAL | 0 refills | Status: DC

## 2020-12-13 MED ORDER — LOPERAMIDE HCL 2 MG PO CAPS
4.0000 mg | ORAL_CAPSULE | Freq: Once | ORAL | Status: AC
  Administered 2020-12-13: 4 mg via ORAL
  Filled 2020-12-13: qty 2

## 2020-12-13 MED ORDER — FUROSEMIDE 20 MG PO TABS: 20.0000 mg | ORAL_TABLET | Freq: Every day | ORAL | 1 refills | Status: DC

## 2020-12-13 MED ORDER — CARVEDILOL 6.25 MG PO TABS: 3.1250 mg | ORAL_TABLET | Freq: Two times a day (BID) | ORAL | 2 refills | Status: DC

## 2020-12-13 MED ORDER — LANTUS SOLOSTAR 100 UNIT/ML ~~LOC~~ SOPN: 30.0000 [IU] | PEN_INJECTOR | Freq: Every day | SUBCUTANEOUS | 1 refills | Status: DC

## 2020-12-13 NOTE — Progress Notes (Addendum)
Patient requested to leave AMA for family emergency. AMA form was signed and risks of leaving against medica advice was explained to patient; patient verbalized understanding. PICC line removed per policy, patient advised to stay for approximately 20-30 minutes after PICC removed, patient stated 'I have to go.". Patient left unit ambulatory. Dr. Arbutus Leas made aware.

## 2020-12-13 NOTE — Discharge Summary (Signed)
Physician Discharge Summary  Naama Sappington WUJ:[id number] DOB: [birth year] DOA: 11/30/2020  PCP: [facility]  Admit date: 11/30/2020 Discharge date: 12/13/2020  Admitted From: Home Disposition:  AGAINST MEDICAL ADVICE   Discharge Condition: AGAINST MEDICAL ADVICE    Brief/Interim Summary: Mrs. Balducci was admitted to the hospital with the working diagnosis of acute hypoxemic respiratory failure due to SARS COVID 19 viral infection, complicated with bacterial pneumonia(parapneumonic effusion/ empyema)with MRSA bacteremia and septic shock.   35 yo female with past medical history of T1 DM, GERD, and cocaine use who presented withweakness and hypoxemia. Recently diagnosed with community acquired pneumonia about 2 days prior to admission, prescribed cefdinir and azithromycin. Her symptoms worsened with with progressive dyspnea, weakness, nausea and vomiting. EMS was called and her oxymetry was found to be 66% and glucose 600 mg/dl. She was transported to the ED. On her initial physical examination BP 110/79, HR 103, T 94.6 and oxymetry 86 to 89%. Lungs with no wheezing or rhonchi, heart S1-S2 present, abdomen soft and non tender, no lower extremity edema.  Sodium 123, potassium 3.1, chloride 82, bicarb 11, glucose 1623, creatinine 1.15, anion gap 30, magnesium 2.9, AST 246, ALT 299, total bilirubins 1.7. White cell count 5.3, hemoglobin 12.2, hematocrit 39.9, platelets 273. SARS COVID-19 positive. Urinalysis>500 glucose, 0-5 white cells, specific gravity 1.022. Drug screen negative.  Chest radiograph with bilateral multilobar infiltrates, predominantly left lower lobe, right lower lobe, right upper lobe. Platelike atelectasis left midlung.  EKG 101 bpm, rightward axis, QTC 500, sinus rhythm, Q-wave 83, aVF, V4-V6, no significant ST segment T wave changes.  Patient was treated with systemic steroids and remdesivir.  She had high oxygen requirements, from  heated high flow nasal cannula to non invasive mechanical ventilation and eventually to invasive mechanical ventilation 11/30/20   She did not received tocilizumab due to concomitant bacterial infection.  Diagnosed with parapneumonic effusion/ empyema, bacterial pneumonia over infection (MRSA).  Her condition deteriorated into shock requiring vasopressors.   Liberated from mechanical ventilation on 12/03/2020, placed on high flow nasal cannula. CT surgery recommended not invasive intervention, since effusions had decreased in size.   Slowly decreasing oxygen requirements but patient very weak and deconditioned.  Barrier to d/c remains to be confusion, agitation and interference with medical therapy. Precedex restarted on 12/08/20  On the morning of 12/13/20, patient stated she had to leave the hospital, even as AMA, due to family emergency.  In speaking with Erma Heritage, Erma Heritage has demonstrated the ability to understand his medical condition(s) which include MRSA empyema and CHF.  Amena Dockham has demonstrated the ability to appreciate how treatment for MRSA empyema and CHF will be beneficial.   Erma Heritage has also demonstrated the ability to understand and appreciate how refusal of treatement for MRSA empyema and CHF could result in harm, repeat hospitalization, and possibly death.  Peniel Biel demonstrates the ability to reason through the risks and benefits of the proposed treatment.  Finally, Erma Heritage is able to clearly communicate his/her choice.   -Even though she left AMA, Rx for Zyvox #28, coreg, and lasix were sent to her pharmacy of record and patient and family informed to pick it up    Discharge Diagnoses:   Acute hypoxemic respiratory failure due to SARS COVID 19 viral pneumonia, -now stable on RA -completed 5 days remdesivir -no tocilizumab or baricitinib due to concomitant bacterial infection -inflammatory markers overall  trending down -continuing solumedrol 40 mg IV daily  bacterial pneumoniasuper-infection (MRSA) with para-pneumonic effusion/ empyema.  Septic shock. -sepsis present on admission -BP now stable -12/01/20--Pleural fluid culture positive for MRSA. Blood cultures with no growth. -Transthoracic echocardiogram with no vegetations. (no tocilizumab or baricitinib due to concomitant bacterial infection) -stable on 2L>>RA -WBC improving  PCCM was following, discussed case with cardiothoracic surgery who did not recommend surgical invention at this time and favored prolonged antibiotic course. On methylprednisolone 40 mg IV daily, continue antibiotic therapy with IV vancomycin/ metronidazole. Dr. Ella Jubilee consulted Dr Earlene Plater ID (phone) for duration of antibiotic therapy, recommendations from 2 to 3 weeks of IV vancomycin or orallinezolid. Sheisnoted to not be a good candidate for IV antibiotics on discharge due to her history of substance abuse -Even though she left AMA, Rx for Zyvox #28 was sent to her pharmacy of record and patient and family informed to pick it up   Acute systolic heart failure.  2/17/22EF 30 -35% with akinetic, mid and distal anterior wall, mid and distal anterior septum, entire apex, mid and distal inferior wall and mid inferoseptal segment.   -Continuefurosemide 20 mg Iv for diuresis, -personally reviewedChest x-ray from 2/22 shows persistent pulmonary edema and small bilateral pleural effusions. -Continue diuresis to target negative fluid balance. -Holding on b blocker and acei due to soft BPs -will ultimately need outpt cards work up -even though patient left AMA, coreg and lasix were sent to patient's pharmacy of record and family and patient informed to pick them up  Hyperkalemia/ hypokalemia/ hyponatremia. -Na improving with diuresis -K repleted  Acute metabolic encephalopathy (hx of substance abuse). -Patient was transiently on Precedex  infusion, but was weaned off on 2/20 -continued to have intermittent episodes of agitation/confusion where she is trying to get out of bed Lorazepam has not had any significant impact on her agitation -Since it was felt that she would be a risk to herself, she has been started back on low-dose Precedexon 12/08/20 -Continue with aspiration and fall precautions. -On multivitamins and thiamine. -Continue out of bed to chair tid with meals as tolerated, PT and OT. -speech evaluation, for now continue with dysphagia2diet.   Gabapentin dose has been decreased PRN lorazepam for agitation. -12/11/20--weaned offprecedex -overall resolved and remained A&O x 4 off precedex  UncontrolledT1DM with DKAand hyperglycemia. -DKA now resolved -Patient with very poor oral intake. -11/30/20 A1C--14.7 -increase lantus to 25 units>>20  Acute anemia and thrombocytopenia. -Hgb8.8and hct 26.  -She does not have any evidence of obvious bleeding -iron saturation 11%, ferritin 198 -B12 3026, folate 8.1  Generalized weakness -een by physical therapy with recommendation for skilled nursing facility placement -Continue physical therapy while in hospital  Hypomagnesemia -repleted Discharge Instructions   Allergies as of 12/13/2020      Reactions   Codeine Anaphylaxis, Nausea And Vomiting   Penicillins Rash   As a teenager   Tramadol Itching      Medication List    STOP taking these medications   azithromycin 250 MG tablet Commonly known as: ZITHROMAX   cefdinir 300 MG capsule Commonly known as: OMNICEF   cephALEXin 500 MG capsule Commonly known as: KEFLEX   magic mouthwash w/lidocaine Soln   naproxen 500 MG tablet Commonly known as: NAPROSYN   ondansetron 4 MG disintegrating tablet Commonly known as: Zofran ODT   ondansetron 4 MG tablet Commonly known as: Zofran   phenol 1.4 % Liqd Commonly known as: CHLORASEPTIC   polyethylene glycol 17 g packet Commonly known  as: MIRALAX / GLYCOLAX     TAKE these medications   carvedilol 6.25 MG tablet  Commonly known as: COREG Take 0.5 tablets (3.125 mg total) by mouth 2 (two) times daily with a meal.   furosemide 20 MG tablet Commonly known as: Lasix Take 1 tablet (20 mg total) by mouth daily.   gabapentin 300 MG capsule Commonly known as: NEURONTIN Take 300 mg by mouth 3 (three) times daily.   Lantus SoloStar 100 UNIT/ML Solostar Pen Generic drug: insulin glargine Inject 30 Units into the skin daily at 10 pm. What changed:   how much to take  when to take this   linaclotide 290 MCG Caps capsule Commonly known as: LINZESS Take 290 mcg by mouth daily as needed (for constipation).   linezolid 600 MG tablet Commonly known as: Zyvox Take 1 tablet (600 mg total) by mouth 2 (two) times daily.   NovoLOG FlexPen 100 UNIT/ML FlexPen Generic drug: insulin aspart Inject 1-15 Units into the skin 3 (three) times daily with meals. Dose based on CBG and carb count, 1 unit for every 15 carb.   omeprazole 40 MG capsule Commonly known as: PRILOSEC Take 40 mg by mouth daily before breakfast.   tiotropium 18 MCG inhalation capsule Commonly known as: SPIRIVA Place 18 mcg into inhaler and inhale daily.       Follow-up Information    [facility] Follow up.   Why: PT/OT Contact information: [address] [place] [address] [phone]             Allergies  Allergen Reactions  . Codeine Anaphylaxis and Nausea And Vomiting  . Penicillins Rash    As a teenager  . Tramadol Itching    Consultations:  none   Procedures/Studies: CT CHEST WO CONTRAST  Result Date: 12/03/2020 CLINICAL DATA:  COVID pneumonia. EXAM: CT CHEST WITHOUT CONTRAST TECHNIQUE: Multidetector CT imaging of the chest was performed following the standard protocol without IV contrast. COMPARISON:  Chest CT 05/18/2017 FINDINGS: Cardiovascular: The heart is normal in size. No pericardial effusion.  The endotracheal tube is in good position just above the carina. There is an NG tube coursing down the esophagus and into the stomach. The right IJ central venous catheter is in good position. Mediastinum/Nodes: Borderline enlarged mediastinal and hilar lymph nodes likely reactive/inflammatory. Lungs/Pleura: Underlying emphysematous changes are noted with superimposed severe interstitial process in the upper lobes with areas of cystic change which could be bronchiectasis or pneumatocele see. There is fairly dense airspace consolidation in both lower lobes with suspected areas of mild cavitation most notably in the right lower lobe medially. There are also small bilateral pleural effusions, right larger than left. No pneumothorax or pneumomediastinum. Upper Abdomen: Upper abdominal ascites is noted with fluid around the gallbladder, in the hepato renal fossa and pericolic gutters. Musculoskeletal: No significant bony findings. IMPRESSION: 1. Underlying emphysematous changes with superimposed severe interstitial process in the upper lobes with areas of cystic change which could be bronchiectasis or pneumatoceles. 2. Dense airspace consolidation in both lower lobes with suspected areas of mild cavitation most notably in the right lower lobe medially. 3. Small bilateral pleural effusions, right larger than left. 4. Upper abdominal ascites. 5. Lines and tubes in good position without complicating features. Emphysema (ICD10-J43.9). Electronically Signed   By: Rudie Meyer M.D.   On: 12/03/2020 13:20   CT ABDOMEN PELVIS W CONTRAST  Result Date: 11/28/2020 CLINICAL DATA:  Abdominal pain, biliary obstruction suspected. EXAM: CT ABDOMEN AND PELVIS WITH CONTRAST TECHNIQUE: Multidetector CT imaging of the abdomen and pelvis was performed using the standard protocol  following bolus administration of intravenous contrast. CONTRAST:  OMNIPAQUE IOHEXOL 300 MG/ML  SOLN COMPARISON:  CT abdomen pelvis 04/10/2017, CT angio  chest 05/18/2017 FINDINGS: Lower chest: Partially visualized trace right pleural effusion. Associated passive atelectasis of the right lower lobe. Dependent right lower lobe consolidation. Masslike right upper and right lower lobe consolidations (4:3, 5:33). Hepatobiliary: The liver measures at upper limits of normal in size. No focal liver abnormality. No gallstones, gallbladder wall thickening, or pericholecystic fluid. No biliary dilatation. Pancreas: No focal lesion. Normal pancreatic contour. No surrounding inflammatory changes. No main pancreatic ductal dilatation. Spleen: Normal in size without focal abnormality. Adrenals/Urinary Tract: No adrenal nodule bilaterally. Bilateral kidneys enhance symmetrically. No hydronephrosis. No hydroureter. The urinary bladder is unremarkable. Stomach/Bowel: Fluid level within the gastric lumen likely related to ingested material. Stomach is within normal limits. Prominent but not dilated loop of small bowel within the lower anterior abdomen with no definite transition point. No pneumatosis. No evidence of bowel wall thickening or dilatation. Appendix appears normal. Vascular/Lymphatic: No abdominal aorta or iliac aneurysm. 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 No abdominal, pelvic, or inguinal lymphadenopathy. 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 Uterus and bilateral adnexa are unremarkable. Other: Trace simple free fluid within the pelvis that could be physiologic in etiology. No intraperitoneal free gas. No organized fluid collection. Musculoskeletal: No acute or significant osseous findings. IMPRESSION: 1. Dependent right lower lobe consolidation. Masslike right upper and right lower lobe consolidation. Findings likely represents infection/inflammation. Recommend repeat chest x-ray PA and lateral in 3-4 weeks to exclude underlying malignancy. 2. Trace volume right pleural effusion. 3. 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 Electronically Signed   By: Tish Frederickson M.D.   On: 11/28/2020 01:35   DG Chest Port 1 View  Result Date: 12/07/2020 CLINICAL DATA:  Dyspnea. EXAM: PORTABLE CHEST 1 VIEW COMPARISON:  December 05, 2020. FINDINGS: Improved diffuse interstitial opacities with slight worsening of dense left basilar consolidation. Small bilateral pleural effusions, likely slightly increased on the right. No visible pneumothorax. Cardiomediastinal silhouette is within normal limits. IMPRESSION: 1. Improved diffuse interstitial opacities with slight worsening of dense left basilar consolidation. 2. Small bilateral pleural effusions, likely slightly increased on the right. Electronically Signed   By: Feliberto Harts MD   On: 12/07/2020 13:42   DG Chest Port 1 View  Result Date: 12/05/2020 CLINICAL DATA:  COVID positive with severe MRSA septic shock EXAM: PORTABLE CHEST 1 VIEW COMPARISON:  12/02/2020 FINDINGS: Right arm PICC line tip is at the cavoatrial junction. Interval removal of NG tube and ET tube. Heart size is normal. Bilateral scratch set small bilateral pleural effusions are again noted. Diffuse interstitial and airspace densities appear unchanged from previous exam. IMPRESSION: 1. Persistent bilateral pleural effusions and bilateral interstitial and airspace opacities compatible with multifocal atypical viral pneumonia. 2. Stable appearance of the right arm PICC line. Electronically Signed   By: Signa Kell M.D.   On: 12/05/2020 07:01   DG CHEST PORT 1 VIEW  Result Date: 12/02/2020 CLINICAL DATA:  Intubated, COVID EXAM: PORTABLE CHEST 1 VIEW COMPARISON:  12/01/2020 FINDINGS: Endotracheal tube is 3 cm above the carina. NG tube is in the stomach. Layering bilateral pleural effusions. Interstitial prominence throughout the lungs. Bilateral lower lobe airspace disease. No real change since prior study. IMPRESSION: Persistent interstitial prominence and  pulmonary infiltrates. Layering bilateral effusions. No change. Electronically Signed   By: Charlett Nose M.D.   On:  12/02/2020 06:33   DG Chest Portable 1 View  Result Date: 12/01/2020 CLINICAL DATA:  RIGHT pleural effusion, COVID-19, sepsis EXAM: PORTABLE CHEST 1 VIEW COMPARISON:  Portable exam at 1345 hrs compared to 0921 hrs FINDINGS: Tip of endotracheal tube projects 1.9 cm above carina. NG tube extends into stomach. Stable heart size and mediastinal contours normal. Persistent diffuse pulmonary infiltrates unchanged. Persistent small RIGHT pleural effusion. No pneumothorax following RIGHT thoracentesis. IMPRESSION: No pneumothorax following diagnostic RIGHT thoracentesis. Persistent pulmonary infiltrates and small RIGHT pleural effusion. Electronically Signed   By: Ulyses Southward M.D.   On: 12/01/2020 14:20   DG CHEST PORT 1 VIEW  Result Date: 12/01/2020 CLINICAL DATA:  35 year old ventilated female status post central line placement. EXAM: PORTABLE CHEST 1 VIEW COMPARISON:  Chest x-ray 11/30/2020. FINDINGS: An endotracheal tube is in place with tip 3.3 cm above the carina. There is a right-sided internal jugular central venous catheter with tip terminating in the distal superior vena cava. Widespread areas of ill-defined opacities and areas of interstitial prominence are noted throughout the lungs bilaterally, most evident throughout the mid to lower lungs, minimally improved compared to the prior examination. Moderate right and small left pleural effusions. No pneumothorax. Pulmonary vasculature is largely obscured. Heart size is normal. The patient is rotated to the left on today's exam, resulting in distortion of the mediastinal contours and reduced diagnostic sensitivity and specificity for mediastinal pathology. IMPRESSION: 1. Support apparatus, as above. 2. The appearance of the lungs is most compatible with severe multilobar bilateral pneumonia related to known COVID infection. Overall, aeration  has slightly improved compared to the prior study. 3. Moderate right and small left pleural effusions similar to the prior examination. Electronically Signed   By: Trudie Reed M.D.   On: 12/01/2020 09:37   DG CHEST PORT 1 VIEW  Result Date: 11/30/2020 CLINICAL DATA:  COVID-19 positive, tobacco abuse, intubated EXAM: PORTABLE CHEST 1 VIEW COMPARISON:  11/30/2020, 11/28/2020 FINDINGS: Single frontal view of the chest demonstrates interval placement of endotracheal tube overlying tracheal air column, tip 1.7 cm above carina. Enteric catheter passes below diaphragm tip excluded by collimation, side port projecting over the gastric body. Multifocal bilateral ground-glass airspace disease has progressed since prior study, which could reflect progressive pneumonia or superimposed edema. Stable right pleural effusion. No pneumothorax. IMPRESSION: 1. Support devices as above. 2. Progressive diffuse ground-glass airspace disease, which may reflect worsening pneumonia or superimposed edema. 3. Stable right pleural effusion. Electronically Signed   By: Sharlet Salina M.D.   On: 11/30/2020 20:07   DG Chest Port 1 View  Result Date: 11/30/2020 CLINICAL DATA:  Shortness of breath, recent pneumonia in a 35 year old female EXAM: PORTABLE CHEST 1 VIEW COMPARISON:  November 28, 2020 FINDINGS: Interstitial and airspace disease worse than on the prior study, superimposed on a background of pulmonary emphysema. Interval development of a small RIGHT-sided pleural effusion. Trachea midline. Cardiomediastinal contours and hilar structures are stable. Nodular component to the airspace process with focal areas of nodularity the for example in the RIGHT upper and LEFT lower lobe. No acute skeletal process on limited assessment. IMPRESSION: 1. Worsening interstitial and airspace disease superimposed on a background of pulmonary emphysema. Findings suspicious for multifocal pneumonia. Given nodular component would suggest follow-up  to ensure resolution and exclude underlying pulmonary nodules. 2. New small RIGHT-sided pleural effusion. Could also consider the possibility of an overlay of heart failure. Electronically Signed   By: Donzetta Kohut M.D.   On: 11/30/2020 09:21   DG Chest  Port 1 View  Result Date: 11/28/2020 CLINICAL DATA:  Nausea, vomiting, fever and cough. EXAM: PORTABLE CHEST 1 VIEW COMPARISON:  04/06/2020 FINDINGS: Artifact overlies the chest. Heart size is normal. There are patchy pulmonary infiltrates in both lungs, more extensive on the right than the left, consistent with widespread bronchopneumonia. This could be bacterial or viral. No lobar consolidation or collapse. No pleural effusion. No significant bone finding. IMPRESSION: Widespread bilateral bronchopneumonia, right more extensive than left. This could be bacterial or viral. Electronically Signed   By: Paulina Fusi M.D.   On: 11/28/2020 00:26   ECHOCARDIOGRAM COMPLETE  Result Date: 12/03/2020    ECHOCARDIOGRAM REPORT   Patient Name:   ASUCENA GALER Date of Exam: 12/03/2020 Medical Rec #:  [id number]         Height: Accession #:    [id number]        Weight: Date of Birth:  01/05/1986         BSA: Patient Age:    34 years          BP:           120/89 mmHg Patient Gender: F                 HR:           77 bpm. [facility] Procedure: 2D Echo Indications:    Endocarditis I38  History:        Patient has prior history of Echocardiogram examinations, most                 recent 03/09/2019. Signs/Symptoms:Murmur; Risk Factors:Current                 Smoker and Diabetes. Substance induced mood disorder ,                 Pneumonia, Cocaine Abuse.  Sonographer:    Jeryl Columbia RDCS (AE) Referring Phys: [id number] RAKESH V ALVA IMPRESSIONS  1. Left ventricular ejection fraction, by estimation, is 30 to 35%. The left ventricle has moderately decreased function. The left ventricle demonstrates regional wall motion abnormalities (see scoring diagram/findings  for description). Left ventricular  diastolic parameters were normal.  2. Right ventricular systolic function is moderately reduced. The right ventricular size is normal. There is normal pulmonary artery systolic pressure. The estimated right ventricular systolic pressure is 22.0 mmHg.  3. The mitral valve is grossly normal. Mild to moderate mitral valve regurgitation.  4. The aortic valve is tricuspid. Aortic valve regurgitation is not visualized.  5. The inferior vena cava is normal in size with greater than 50% respiratory variability, suggesting right atrial pressure of 3 mmHg.  6. No obvious valvular vegetations. FINDINGS  Left Ventricle: Left ventricular ejection fraction, by estimation, is 30 to 35%. The left ventricle has moderately decreased function. The left ventricle demonstrates regional wall motion abnormalities. The left ventricular internal cavity size was normal in size. There is no left ventricular hypertrophy. Left ventricular diastolic parameters were normal.  LV Wall Scoring: The mid and distal anterior wall, mid and distal anterior septum, entire apex, mid and distal inferior wall, and mid inferoseptal segment are akinetic. The basal anteroseptal segment, mid inferolateral segment, and mid anterolateral segment are hypokinetic. The basal inferolateral segment, basal anterolateral segment, basal anterior segment, basal inferior segment, and basal inferoseptal segment are normal. Right Ventricle: The right ventricular size is normal. No increase in right ventricular wall thickness. Right ventricular systolic function is moderately reduced. There is  normal pulmonary artery systolic pressure. The tricuspid regurgitant velocity is 2.18 m/s, and with an assumed right atrial pressure of 3 mmHg, the estimated right ventricular systolic pressure is 22.0 mmHg. Left Atrium: Left atrial size was normal in size. Right Atrium: Right atrial size was normal in size. Pericardium: There is no evidence of  pericardial effusion. Mitral Valve: The mitral valve is grossly normal. Mild to moderate mitral valve regurgitation, with eccentric posteriorly directed jet. Tricuspid Valve: The tricuspid valve is grossly normal. Tricuspid valve regurgitation is mild. Aortic Valve: The aortic valve is tricuspid. Aortic valve regurgitation is not visualized. Pulmonic Valve: The pulmonic valve was grossly normal. Pulmonic valve regurgitation is not visualized. Aorta: The aortic root is normal in size and structure. Venous: The inferior vena cava is normal in size with greater than 50% respiratory variability, suggesting right atrial pressure of 3 mmHg. IAS/Shunts: No atrial level shunt detected by color flow Doppler.  LEFT VENTRICLE PLAX 2D LVIDd:         3.72 cm  Diastology LVIDs:         3.15 cm  LV e' medial:    6.53 cm/s LV PW:         1.00 cm  LV E/e' medial:  9.7 LV IVS:        0.80 cm  LV e' lateral:   6.42 cm/s LVOT diam:     1.80 cm  LV E/e' lateral: 9.9 LVOT Area:     2.54 cm  RIGHT VENTRICLE RV S prime:     7.29 cm/s LEFT ATRIUM             RIGHT ATRIUM LA diam:        2.20 cm RA Area:     9.12 cm LA Vol (A2C):   24.3 ml RA Volume:   20.90 ml LA Vol (A4C):   28.3 ml LA Biplane Vol: 27.0 ml   AORTA Ao Root diam: 2.40 cm MITRAL VALVE               TRICUSPID VALVE MV Area (PHT): 4.21 cm    TR Peak grad:   19.0 mmHg MV Decel Time: 180 msec    TR Vmax:        218.00 cm/s MV E velocity: 63.40 cm/s MV A velocity: 70.70 cm/s  SHUNTS MV E/A ratio:  0.90        Systemic Diam: 1.80 cm Nona Dell MD Electronically signed by Nona Dell MD Signature Date/Time: 12/03/2020/4:11:56 PM    Final    [place] EKG SITE RITE  Result Date: 12/04/2020 If Site Rite image not attached, placement could not be confirmed due to current cardiac rhythm.  US Abdomen Limited RUQ (LIVER/GB)  Result Date: 11/30/2020 CLINICAL DATA:  Right upper quadrant pain and elevated LFTs EXAM: ULTRASOUND ABDOMEN LIMITED RIGHT UPPER QUADRANT COMPARISON:   None. FINDINGS: Gallbladder: Gallbladder is well distended. Mild gallbladder sludge is noted. No gallstones are seen. Common bile duct: Diameter: 3.2 mm. Liver: Mild increased echogenicity consistent with fatty infiltration. No mass lesion is noted. Portal vein is patent on color Doppler imaging with normal direction of blood flow towards the liver. Other: None. IMPRESSION: Fatty infiltration of the liver. Gallbladder sludge.  No cholelithiasis or wall thickening is noted. Electronically Signed   By: Alcide Clever M.D.   On: 11/30/2020 12:50   US THORACENTESIS ASP PLEURAL SPACE W/IMG GUIDE  Result Date: 12/01/2020 INDICATION: COVID-19 pneumonia, sepsis, small RIGHT pleural effusion EXAM: ULTRASOUND GUIDED DIAGNOSTIC RIGHT THORACENTESIS  MEDICATIONS: None. COMPLICATIONS: None immediate. PROCEDURE: An ultrasound guided thoracentesis was thoroughly discussed with the patient and questions answered. The benefits, risks, alternatives and complications were also discussed. The patient understands and wishes to proceed with the procedure. Written consent was obtained. Ultrasound was performed to localize and mark an adequate pocket of fluid in the RIGHT chest. The area was then prepped and draped in the normal sterile fashion. 1% Lidocaine was used for local anesthesia. Under ultrasound guidance a a syringe with a 22 gauge needle was advanced into the small RIGHT pleural effusion. 16 cc of fluid was performed. The catheter was removed and a dressing applied. FINDINGS: A total of approximately 16 mL of slightly cloudy yellow fluid was removed. Sample was sent to the laboratory as requested by the clinical team. IMPRESSION: Successful ultrasound guided right thoracentesis yielding 16 mL of pleural fluid. Electronically Signed   By: Ulyses Southward M.D.   On: 12/01/2020 14:24         Discharge Exam: Vitals:   12/13/20 0642 12/13/20 0823  BP: 130/85 (!) 164/83  Pulse: 96 72  Resp: 17   Temp: 98 F (36.7 C)    SpO2: 97% 98%   Vitals:   12/12/20 1419 12/12/20 2142 12/13/20 0642 12/13/20 0823  BP: 132/86 120/72 130/85 (!) 164/83  Pulse: 92 92 96 72  Resp: 20 16 17    Temp: 99 F (37.2 C) 98 F (36.7 C) 98 F (36.7 C)   TempSrc: Oral     SpO2: 96% 98% 97% 98%  Weight:      Height:        General: Pt is alert, awake, not in acute distress Cardiovascular: RRR, S1/S2 +, no rubs, no gallops Respiratory: CTA bilaterally, no wheezing, no rhonchi Abdominal: Soft, NT, ND, bowel sounds + Extremities: no edema, no cyanosis   The results of significant diagnostics from this hospitalization (including imaging, microbiology, ancillary and laboratory) are listed below for reference.    Significant Diagnostic Studies: CT CHEST WO CONTRAST  Result Date: 12/03/2020 CLINICAL DATA:  COVID pneumonia. EXAM: CT CHEST WITHOUT CONTRAST TECHNIQUE: Multidetector CT imaging of the chest was performed following the standard protocol without IV contrast. COMPARISON:  Chest CT 05/18/2017 FINDINGS: Cardiovascular: The heart is normal in size. No pericardial effusion. The endotracheal tube is in good position just above the carina. There is an NG tube coursing down the esophagus and into the stomach. The right IJ central venous catheter is in good position. Mediastinum/Nodes: Borderline enlarged mediastinal and hilar lymph nodes likely reactive/inflammatory. Lungs/Pleura: Underlying emphysematous changes are noted with superimposed severe interstitial process in the upper lobes with areas of cystic change which could be bronchiectasis or pneumatocele see. There is fairly dense airspace consolidation in both lower lobes with suspected areas of mild cavitation most notably in the right lower lobe medially. There are also small bilateral pleural effusions, right larger than left. No pneumothorax or pneumomediastinum. Upper Abdomen: Upper abdominal ascites is noted with fluid around the gallbladder, in the hepato renal fossa and  pericolic gutters. Musculoskeletal: No significant bony findings. IMPRESSION: 1. Underlying emphysematous changes with superimposed severe interstitial process in the upper lobes with areas of cystic change which could be bronchiectasis or pneumatoceles. 2. Dense airspace consolidation in both lower lobes with suspected areas of mild cavitation most notably in the right lower lobe medially. 3. Small bilateral pleural effusions, right larger than left. 4. Upper abdominal ascites. 5. Lines and tubes in good position without complicating features. Emphysema (ICD10-J43.9). Electronically  Signed   By: Rudie Meyer M.D.   On: 12/03/2020 13:20   CT ABDOMEN PELVIS W CONTRAST  Result Date: 11/28/2020 CLINICAL DATA:  Abdominal pain, biliary obstruction suspected. EXAM: CT ABDOMEN AND PELVIS WITH CONTRAST TECHNIQUE: Multidetector CT imaging of the abdomen and pelvis was performed using the standard protocol following bolus administration of intravenous contrast. CONTRAST:  OMNIPAQUE IOHEXOL 300 MG/ML  SOLN COMPARISON:  CT abdomen pelvis 04/10/2017, CT angio chest 05/18/2017 FINDINGS: Lower chest: Partially visualized trace right pleural effusion. Associated passive atelectasis of the right lower lobe. Dependent right lower lobe consolidation. Masslike right upper and right lower lobe consolidations (4:3, 5:33). Hepatobiliary: The liver measures at upper limits of normal in size. No focal liver abnormality. No gallstones, gallbladder wall thickening, or pericholecystic fluid. No biliary dilatation. Pancreas: No focal lesion. Normal pancreatic contour. No surrounding inflammatory changes. No main pancreatic ductal dilatation. Spleen: Normal in size without focal abnormality. Adrenals/Urinary Tract: No adrenal nodule bilaterally. Bilateral kidneys enhance symmetrically. No hydronephrosis. No hydroureter. The urinary bladder is unremarkable. Stomach/Bowel: Fluid level within the gastric lumen likely related to ingested  material. Stomach is within normal limits. Prominent but not dilated loop of small bowel within the lower anterior abdomen with no definite transition point. No pneumatosis. No evidence of bowel wall thickening or dilatation. Appendix appears normal. Vascular/Lymphatic: No abdominal aorta or iliac aneurysm. 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 No abdominal, pelvic, or inguinal lymphadenopathy. 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 Uterus and bilateral adnexa are unremarkable. Other: Trace simple free fluid within the pelvis that could be physiologic in etiology. No intraperitoneal free gas. No organized fluid collection. Musculoskeletal: No acute or significant osseous findings. IMPRESSION: 1. Dependent right lower lobe consolidation. Masslike right upper and right lower lobe consolidation. Findings likely represents infection/inflammation. Recommend repeat chest x-ray PA and lateral in 3-4 weeks to exclude underlying malignancy. 2. Trace volume right pleural effusion. 3. 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 Electronically Signed   By: Tish Frederickson M.D.   On: 11/28/2020 01:35   DG Chest Port 1 View  Result Date: 12/07/2020 CLINICAL DATA:  Dyspnea. EXAM: PORTABLE CHEST 1 VIEW COMPARISON:  December 05, 2020. FINDINGS: Improved diffuse interstitial opacities with slight worsening of dense left basilar consolidation. Small bilateral pleural effusions, likely slightly increased on the right. No visible pneumothorax. Cardiomediastinal silhouette is within normal limits. IMPRESSION: 1. Improved diffuse interstitial opacities with slight worsening of dense left basilar consolidation. 2. Small bilateral pleural effusions, likely slightly increased on the right. Electronically Signed   By: Feliberto Harts MD   On: 12/07/2020 13:42   DG Chest Port 1 View  Result Date: 12/05/2020 CLINICAL DATA:  COVID positive  with severe MRSA septic shock EXAM: PORTABLE CHEST 1 VIEW COMPARISON:  12/02/2020 FINDINGS: Right arm PICC line tip is at the cavoatrial junction. Interval removal of NG tube and ET tube. Heart size is normal. Bilateral scratch set small bilateral pleural effusions are again noted. Diffuse interstitial and airspace densities appear unchanged from previous exam. IMPRESSION: 1. Persistent bilateral pleural effusions and bilateral interstitial and airspace opacities compatible with multifocal atypical viral pneumonia. 2. Stable appearance of the right arm PICC line. Electronically Signed   By: Signa Kell M.D.   On: 12/05/2020 07:01   DG CHEST PORT 1 VIEW  Result Date: 12/02/2020 CLINICAL DATA:  Intubated, COVID EXAM: PORTABLE CHEST 1 VIEW COMPARISON:  12/01/2020 FINDINGS: Endotracheal tube is 3 cm  above the carina. NG tube is in the stomach. Layering bilateral pleural effusions. Interstitial prominence throughout the lungs. Bilateral lower lobe airspace disease. No real change since prior study. IMPRESSION: Persistent interstitial prominence and pulmonary infiltrates. Layering bilateral effusions. No change. Electronically Signed   By: Charlett Nose M.D.   On: 12/02/2020 06:33   DG Chest Portable 1 View  Result Date: 12/01/2020 CLINICAL DATA:  RIGHT pleural effusion, COVID-19, sepsis EXAM: PORTABLE CHEST 1 VIEW COMPARISON:  Portable exam at 1345 hrs compared to 0921 hrs FINDINGS: Tip of endotracheal tube projects 1.9 cm above carina. NG tube extends into stomach. Stable heart size and mediastinal contours normal. Persistent diffuse pulmonary infiltrates unchanged. Persistent small RIGHT pleural effusion. No pneumothorax following RIGHT thoracentesis. IMPRESSION: No pneumothorax following diagnostic RIGHT thoracentesis. Persistent pulmonary infiltrates and small RIGHT pleural effusion. Electronically Signed   By: Ulyses Southward M.D.   On: 12/01/2020 14:20   DG CHEST PORT 1 VIEW  Result Date:  12/01/2020 CLINICAL DATA:  35 year old ventilated female status post central line placement. EXAM: PORTABLE CHEST 1 VIEW COMPARISON:  Chest x-ray 11/30/2020. FINDINGS: An endotracheal tube is in place with tip 3.3 cm above the carina. There is a right-sided internal jugular central venous catheter with tip terminating in the distal superior vena cava. Widespread areas of ill-defined opacities and areas of interstitial prominence are noted throughout the lungs bilaterally, most evident throughout the mid to lower lungs, minimally improved compared to the prior examination. Moderate right and small left pleural effusions. No pneumothorax. Pulmonary vasculature is largely obscured. Heart size is normal. The patient is rotated to the left on today's exam, resulting in distortion of the mediastinal contours and reduced diagnostic sensitivity and specificity for mediastinal pathology. IMPRESSION: 1. Support apparatus, as above. 2. The appearance of the lungs is most compatible with severe multilobar bilateral pneumonia related to known COVID infection. Overall, aeration has slightly improved compared to the prior study. 3. Moderate right and small left pleural effusions similar to the prior examination. Electronically Signed   By: Trudie Reed M.D.   On: 12/01/2020 09:37   DG CHEST PORT 1 VIEW  Result Date: 11/30/2020 CLINICAL DATA:  COVID-19 positive, tobacco abuse, intubated EXAM: PORTABLE CHEST 1 VIEW COMPARISON:  11/30/2020, 11/28/2020 FINDINGS: Single frontal view of the chest demonstrates interval placement of endotracheal tube overlying tracheal air column, tip 1.7 cm above carina. Enteric catheter passes below diaphragm tip excluded by collimation, side port projecting over the gastric body. Multifocal bilateral ground-glass airspace disease has progressed since prior study, which could reflect progressive pneumonia or superimposed edema. Stable right pleural effusion. No pneumothorax. IMPRESSION: 1.  Support devices as above. 2. Progressive diffuse ground-glass airspace disease, which may reflect worsening pneumonia or superimposed edema. 3. Stable right pleural effusion. Electronically Signed   By: Sharlet Salina M.D.   On: 11/30/2020 20:07   DG Chest Port 1 View  Result Date: 11/30/2020 CLINICAL DATA:  Shortness of breath, recent pneumonia in a 35 year old female EXAM: PORTABLE CHEST 1 VIEW COMPARISON:  November 28, 2020 FINDINGS: Interstitial and airspace disease worse than on the prior study, superimposed on a background of pulmonary emphysema. Interval development of a small RIGHT-sided pleural effusion. Trachea midline. Cardiomediastinal contours and hilar structures are stable. Nodular component to the airspace process with focal areas of nodularity the for example in the RIGHT upper and LEFT lower lobe. No acute skeletal process on limited assessment. IMPRESSION: 1. Worsening interstitial and airspace disease superimposed on a background of pulmonary emphysema. Findings  suspicious for multifocal pneumonia. Given nodular component would suggest follow-up to ensure resolution and exclude underlying pulmonary nodules. 2. New small RIGHT-sided pleural effusion. Could also consider the possibility of an overlay of heart failure. Electronically Signed   By: Donzetta Kohut M.D.   On: 11/30/2020 09:21   DG Chest Port 1 View  Result Date: 11/28/2020 CLINICAL DATA:  Nausea, vomiting, fever and cough. EXAM: PORTABLE CHEST 1 VIEW COMPARISON:  04/06/2020 FINDINGS: Artifact overlies the chest. Heart size is normal. There are patchy pulmonary infiltrates in both lungs, more extensive on the right than the left, consistent with widespread bronchopneumonia. This could be bacterial or viral. No lobar consolidation or collapse. No pleural effusion. No significant bone finding. IMPRESSION: Widespread bilateral bronchopneumonia, right more extensive than left. This could be bacterial or viral. Electronically Signed    By: Paulina Fusi M.D.   On: 11/28/2020 00:26   ECHOCARDIOGRAM COMPLETE  Result Date: 12/03/2020    ECHOCARDIOGRAM REPORT   Patient Name:   CATORI PANOZZO Date of Exam: 12/03/2020 Medical Rec #:  [id number]         Height: Accession #:    [id number]        Weight: Date of Birth:  03-Nov-1985         BSA: Patient Age:    34 years          BP:           120/89 mmHg Patient Gender: F                 HR:           77 bpm. [facility] Procedure: 2D Echo Indications:    Endocarditis I38  History:        Patient has prior history of Echocardiogram examinations, most                 recent 03/09/2019. Signs/Symptoms:Murmur; Risk Factors:Current                 Smoker and Diabetes. Substance induced mood disorder ,                 Pneumonia, Cocaine Abuse.  Sonographer:    Jeryl Columbia RDCS (AE) Referring Phys: [id number] RAKESH V ALVA IMPRESSIONS  1. Left ventricular ejection fraction, by estimation, is 30 to 35%. The left ventricle has moderately decreased function. The left ventricle demonstrates regional wall motion abnormalities (see scoring diagram/findings for description). Left ventricular  diastolic parameters were normal.  2. Right ventricular systolic function is moderately reduced. The right ventricular size is normal. There is normal pulmonary artery systolic pressure. The estimated right ventricular systolic pressure is 22.0 mmHg.  3. The mitral valve is grossly normal. Mild to moderate mitral valve regurgitation.  4. The aortic valve is tricuspid. Aortic valve regurgitation is not visualized.  5. The inferior vena cava is normal in size with greater than 50% respiratory variability, suggesting right atrial pressure of 3 mmHg.  6. No obvious valvular vegetations. FINDINGS  Left Ventricle: Left ventricular ejection fraction, by estimation, is 30 to 35%. The left ventricle has moderately decreased function. The left ventricle demonstrates regional wall motion abnormalities. The left ventricular  internal cavity size was normal in size. There is no left ventricular hypertrophy. Left ventricular diastolic parameters were normal.  LV Wall Scoring: The mid and distal anterior wall, mid and distal anterior septum, entire apex, mid and distal inferior wall, and mid inferoseptal segment are akinetic. The basal  anteroseptal segment, mid inferolateral segment, and mid anterolateral segment are hypokinetic. The basal inferolateral segment, basal anterolateral segment, basal anterior segment, basal inferior segment, and basal inferoseptal segment are normal. Right Ventricle: The right ventricular size is normal. No increase in right ventricular wall thickness. Right ventricular systolic function is moderately reduced. There is normal pulmonary artery systolic pressure. The tricuspid regurgitant velocity is 2.18 m/s, and with an assumed right atrial pressure of 3 mmHg, the estimated right ventricular systolic pressure is 22.0 mmHg. Left Atrium: Left atrial size was normal in size. Right Atrium: Right atrial size was normal in size. Pericardium: There is no evidence of pericardial effusion. Mitral Valve: The mitral valve is grossly normal. Mild to moderate mitral valve regurgitation, with eccentric posteriorly directed jet. Tricuspid Valve: The tricuspid valve is grossly normal. Tricuspid valve regurgitation is mild. Aortic Valve: The aortic valve is tricuspid. Aortic valve regurgitation is not visualized. Pulmonic Valve: The pulmonic valve was grossly normal. Pulmonic valve regurgitation is not visualized. Aorta: The aortic root is normal in size and structure. Venous: The inferior vena cava is normal in size with greater than 50% respiratory variability, suggesting right atrial pressure of 3 mmHg. IAS/Shunts: No atrial level shunt detected by color flow Doppler.  LEFT VENTRICLE PLAX 2D LVIDd:         3.72 cm  Diastology LVIDs:         3.15 cm  LV e' medial:    6.53 cm/s LV PW:         1.00 cm  LV E/e' medial:  9.7 LV  IVS:        0.80 cm  LV e' lateral:   6.42 cm/s LVOT diam:     1.80 cm  LV E/e' lateral: 9.9 LVOT Area:     2.54 cm  RIGHT VENTRICLE RV S prime:     7.29 cm/s LEFT ATRIUM             RIGHT ATRIUM LA diam:        2.20 cm RA Area:     9.12 cm LA Vol (A2C):   24.3 ml RA Volume:   20.90 ml LA Vol (A4C):   28.3 ml LA Biplane Vol: 27.0 ml   AORTA Ao Root diam: 2.40 cm MITRAL VALVE               TRICUSPID VALVE MV Area (PHT): 4.21 cm    TR Peak grad:   19.0 mmHg MV Decel Time: 180 msec    TR Vmax:        218.00 cm/s MV E velocity: 63.40 cm/s MV A velocity: 70.70 cm/s  SHUNTS MV E/A ratio:  0.90        Systemic Diam: 1.80 cm Nona Dell MD Electronically signed by Nona Dell MD Signature Date/Time: 12/03/2020/4:11:56 PM    Final    [place] EKG SITE RITE  Result Date: 12/04/2020 If Site Rite image not attached, placement could not be confirmed due to current cardiac rhythm.  US Abdomen Limited RUQ (LIVER/GB)  Result Date: 11/30/2020 CLINICAL DATA:  Right upper quadrant pain and elevated LFTs EXAM: ULTRASOUND ABDOMEN LIMITED RIGHT UPPER QUADRANT COMPARISON:  None. FINDINGS: Gallbladder: Gallbladder is well distended. Mild gallbladder sludge is noted. No gallstones are seen. Common bile duct: Diameter: 3.2 mm. Liver: Mild increased echogenicity consistent with fatty infiltration. No mass lesion is noted. Portal vein is patent on color Doppler imaging with normal direction of blood flow towards the liver. Other: None. IMPRESSION: Fatty infiltration of  the liver. Gallbladder sludge.  No cholelithiasis or wall thickening is noted. Electronically Signed   By: Alcide CleverMark  Lukens M.D.   On: 11/30/2020 12:50   US THORACENTESIS ASP PLEURAL SPACE W/IMG GUIDE  Result Date: 12/01/2020 INDICATION: COVID-19 pneumonia, sepsis, small RIGHT pleural effusion EXAM: ULTRASOUND GUIDED DIAGNOSTIC RIGHT THORACENTESIS MEDICATIONS: None. COMPLICATIONS: None immediate. PROCEDURE: An ultrasound guided thoracentesis was thoroughly discussed  with the patient and questions answered. The benefits, risks, alternatives and complications were also discussed. The patient understands and wishes to proceed with the procedure. Written consent was obtained. Ultrasound was performed to localize and mark an adequate pocket of fluid in the RIGHT chest. The area was then prepped and draped in the normal sterile fashion. 1% Lidocaine was used for local anesthesia. Under ultrasound guidance a a syringe with a 22 gauge needle was advanced into the small RIGHT pleural effusion. 16 cc of fluid was performed. The catheter was removed and a dressing applied. FINDINGS: A total of approximately 16 mL of slightly cloudy yellow fluid was removed. Sample was sent to the laboratory as requested by the clinical team. IMPRESSION: Successful ultrasound guided right thoracentesis yielding 16 mL of pleural fluid. Electronically Signed   By: Ulyses SouthwardMark  Boles M.D.   On: 12/01/2020 14:24     Microbiology: No results found for this or any previous visit (from the past 240 hour(s)).   Labs: Basic Metabolic Panel: Recent Labs  Lab 12/08/20 0448 12/09/20 0524 12/10/20 0413 12/11/20 0519 12/12/20 0522 12/12/20 1730 12/13/20 0642  NA 137 141 137 133* 138  --  132*  K 3.3* 3.7 3.8 3.5 2.5*  --  4.8  CL 96* 100 96* 95* 95*  --  96*  CO2 34* 33* 33* 31 33*  --  28  GLUCOSE 151* 179* 302* 421* 59*  --  384*  BUN <5* 6 12 11 13   --  15  CREATININE 0.34* 0.34* 0.42* 0.52 0.35*  --  0.50  CALCIUM 7.7* 7.9* 8.0* 7.8* 8.3*  --  8.6*  MG 1.5* 1.9 1.9 1.5*  --  1.9  --    Liver Function Tests: Recent Labs  Lab 12/09/20 0524 12/10/20 0413 12/11/20 0519 12/12/20 0522  AST 66* 34 27 22  ALT 76* 62* 50* 44  ALKPHOS 273* 292* 245* 213*  BILITOT 0.8 0.6 0.4 0.3  PROT 5.1* 5.2* 5.3* 5.6*  ALBUMIN 2.0* 2.1* 2.1* 2.3*   No results for input(s): LIPASE, AMYLASE in the last 168 hours. No results for input(s): AMMONIA in the last 168 hours. CBC: Recent Labs  Lab  12/08/20 0448 12/09/20 0524 12/11/20 0519 12/12/20 0522  WBC 11.9* 7.6 8.6 10.7*  NEUTROABS 9.1*  --   --   --   HGB 8.8* 9.3* 9.5* 9.5*  HCT 26.3* 28.6* 29.2* 29.1*  MCV 84.8 87.7 87.7 86.9  PLT 122* 133* 211 254   Cardiac Enzymes: No results for input(s): CKTOTAL, CKMB, CKMBINDEX, TROPONINI in the last 168 hours. BNP: Invalid input(s): POCBNP CBG: Recent Labs  Lab 12/12/20 0735 12/12/20 1144 12/12/20 1737 12/12/20 2139 12/13/20 0758  GLUCAP 98 143* 272* 224* 344*    Time coordinating discharge:  36 minutes  Signed:  Catarina Hartshornavid Tat, DO [facility] Pager: [phone] 12/13/2020, 5:47 PM

## 2020-12-28 ENCOUNTER — Inpatient Hospital Stay
Admission: EM | Admit: 2020-12-28 | Discharge: 2020-12-31 | DRG: 291 | Disposition: A | Discharge status: Home / Self Care | Payer: Medicaid Other | Attending: Hospitalist | Admitting: Hospitalist

## 2020-12-28 ENCOUNTER — Encounter: Payer: Self-pay | Admitting: Emergency Medicine

## 2020-12-28 ENCOUNTER — Emergency Department: Payer: Medicaid Other

## 2020-12-28 ENCOUNTER — Other Ambulatory Visit: Payer: Self-pay

## 2020-12-28 DIAGNOSIS — Z8616 Personal history of COVID-19: Secondary | ICD-10-CM | POA: Diagnosis not present

## 2020-12-28 DIAGNOSIS — J181 Lobar pneumonia, unspecified organism: Secondary | ICD-10-CM

## 2020-12-28 DIAGNOSIS — I509 Heart failure, unspecified: Secondary | ICD-10-CM

## 2020-12-28 DIAGNOSIS — Z885 Allergy status to narcotic agent status: Secondary | ICD-10-CM

## 2020-12-28 DIAGNOSIS — Z8249 Family history of ischemic heart disease and other diseases of the circulatory system: Secondary | ICD-10-CM

## 2020-12-28 DIAGNOSIS — K219 Gastro-esophageal reflux disease without esophagitis: Secondary | ICD-10-CM | POA: Diagnosis present

## 2020-12-28 DIAGNOSIS — I340 Nonrheumatic mitral (valve) insufficiency: Secondary | ICD-10-CM | POA: Diagnosis present

## 2020-12-28 DIAGNOSIS — F1414 Cocaine abuse with cocaine-induced mood disorder: Secondary | ICD-10-CM | POA: Diagnosis present

## 2020-12-28 DIAGNOSIS — Z801 Family history of malignant neoplasm of trachea, bronchus and lung: Secondary | ICD-10-CM | POA: Diagnosis not present

## 2020-12-28 DIAGNOSIS — Z79899 Other long term (current) drug therapy: Secondary | ICD-10-CM | POA: Diagnosis not present

## 2020-12-28 DIAGNOSIS — Z9119 Patient's noncompliance with other medical treatment and regimen: Secondary | ICD-10-CM

## 2020-12-28 DIAGNOSIS — I5021 Acute systolic (congestive) heart failure: Secondary | ICD-10-CM | POA: Diagnosis not present

## 2020-12-28 DIAGNOSIS — Z794 Long term (current) use of insulin: Secondary | ICD-10-CM | POA: Diagnosis not present

## 2020-12-28 DIAGNOSIS — E1065 Type 1 diabetes mellitus with hyperglycemia: Secondary | ICD-10-CM | POA: Diagnosis present

## 2020-12-28 DIAGNOSIS — K589 Irritable bowel syndrome without diarrhea: Secondary | ICD-10-CM | POA: Diagnosis present

## 2020-12-28 DIAGNOSIS — Z8614 Personal history of Methicillin resistant Staphylococcus aureus infection: Secondary | ICD-10-CM

## 2020-12-28 DIAGNOSIS — D649 Anemia, unspecified: Secondary | ICD-10-CM | POA: Diagnosis not present

## 2020-12-28 DIAGNOSIS — J9601 Acute respiratory failure with hypoxia: Secondary | ICD-10-CM

## 2020-12-28 DIAGNOSIS — D638 Anemia in other chronic diseases classified elsewhere: Secondary | ICD-10-CM | POA: Diagnosis present

## 2020-12-28 DIAGNOSIS — E1042 Type 1 diabetes mellitus with diabetic polyneuropathy: Secondary | ICD-10-CM | POA: Diagnosis present

## 2020-12-28 DIAGNOSIS — Z9851 Tubal ligation status: Secondary | ICD-10-CM | POA: Diagnosis not present

## 2020-12-28 DIAGNOSIS — I429 Cardiomyopathy, unspecified: Secondary | ICD-10-CM | POA: Diagnosis present

## 2020-12-28 DIAGNOSIS — Z833 Family history of diabetes mellitus: Secondary | ICD-10-CM

## 2020-12-28 DIAGNOSIS — I5023 Acute on chronic systolic (congestive) heart failure: Principal | ICD-10-CM

## 2020-12-28 DIAGNOSIS — F1910 Other psychoactive substance abuse, uncomplicated: Secondary | ICD-10-CM | POA: Diagnosis not present

## 2020-12-28 DIAGNOSIS — Z880 Allergy status to penicillin: Secondary | ICD-10-CM | POA: Diagnosis not present

## 2020-12-28 DIAGNOSIS — F17210 Nicotine dependence, cigarettes, uncomplicated: Secondary | ICD-10-CM | POA: Diagnosis present

## 2020-12-28 LAB — BLOOD GAS, VENOUS
Acid-Base Excess: 5.3 mmol/L — ABNORMAL HIGH (ref 0.0–2.0)
Bicarbonate: 31.1 mmol/L — ABNORMAL HIGH (ref 20.0–28.0)
O2 Saturation: 68.7 %
Patient temperature: 37
pCO2, Ven: 48 mmHg (ref 44.0–60.0)
pH, Ven: 7.42 (ref 7.250–7.430)
pO2, Ven: 35 mmHg (ref 32.0–45.0)

## 2020-12-28 LAB — CBC
HCT: 23.6 % — ABNORMAL LOW (ref 36.0–46.0)
Hemoglobin: 7.4 g/dL — ABNORMAL LOW (ref 12.0–15.0)
MCH: 28.5 pg (ref 26.0–34.0)
MCHC: 31.4 g/dL (ref 30.0–36.0)
MCV: 90.8 fL (ref 80.0–100.0)
Platelets: 363 10*3/uL (ref 150–400)
RBC: 2.6 MIL/uL — ABNORMAL LOW (ref 3.87–5.11)
RDW: 16.8 % — ABNORMAL HIGH (ref 11.5–15.5)
WBC: 7.9 10*3/uL (ref 4.0–10.5)
nRBC: 0 % (ref 0.0–0.2)

## 2020-12-28 LAB — IRON AND TIBC
Iron: 53 ug/dL (ref 28–170)
Saturation Ratios: 20 % (ref 10.4–31.8)
TIBC: 269 ug/dL (ref 250–450)
UIBC: 216 ug/dL

## 2020-12-28 LAB — CBG MONITORING, ED: Glucose-Capillary: 206 mg/dL — ABNORMAL HIGH (ref 70–99)

## 2020-12-28 LAB — BASIC METABOLIC PANEL
Anion gap: 5 (ref 5–15)
Anion gap: 5 (ref 5–15)
BUN: 17 mg/dL (ref 6–20)
BUN: 16 mg/dL (ref 6–20)
CO2: 29 mmol/L (ref 22–32)
CO2: 28 mmol/L (ref 22–32)
Calcium: 8.2 mg/dL — ABNORMAL LOW (ref 8.9–10.3)
Calcium: 8 mg/dL — ABNORMAL LOW (ref 8.9–10.3)
Chloride: 105 mmol/L (ref 98–111)
Chloride: 108 mmol/L (ref 98–111)
Creatinine, Ser: 0.63 mg/dL (ref 0.44–1.00)
Creatinine, Ser: 0.59 mg/dL (ref 0.44–1.00)
GFR, Estimated: >60 mL/min (ref >60)
GFR, Estimated: >60 mL/min (ref >60)
Glucose, Bld: 278 mg/dL — ABNORMAL HIGH (ref 70–99)
Glucose, Bld: 245 mg/dL — ABNORMAL HIGH (ref 70–99)
Potassium: 4.1 mmol/L (ref 3.5–5.1)
Potassium: 4 mmol/L (ref 3.5–5.1)
Sodium: 139 mmol/L (ref 135–145)
Sodium: 141 mmol/L (ref 135–145)

## 2020-12-28 LAB — LACTIC ACID, PLASMA
Lactic Acid, Venous: 1.6 mmol/L (ref 0.5–1.9)
Lactic Acid, Venous: 1.5 mmol/L (ref 0.5–1.9)

## 2020-12-28 LAB — RETICULOCYTES
Immature Retic Fract: 21.7 % — ABNORMAL HIGH (ref 2.3–15.9)
RBC.: 2.4 MIL/uL — ABNORMAL LOW (ref 3.87–5.11)
Retic Count, Absolute: 47.3 10*3/uL (ref 19.0–186.0)
Retic Ct Pct: 2 % (ref 0.4–3.1)

## 2020-12-28 LAB — POC URINE PREG, ED: Preg Test, Ur: NEGATIVE

## 2020-12-28 LAB — TROPONIN I (HIGH SENSITIVITY)
Troponin I (High Sensitivity): 8 ng/L (ref <18)
Troponin I (High Sensitivity): 8 ng/L (ref <18)

## 2020-12-28 LAB — GLUCOSE, CAPILLARY
Glucose-Capillary: 132 mg/dL — ABNORMAL HIGH (ref 70–99)
Glucose-Capillary: 187 mg/dL — ABNORMAL HIGH (ref 70–99)

## 2020-12-28 LAB — FOLATE: Folate: 9.7 ng/mL (ref >5.9)

## 2020-12-28 LAB — LACTATE DEHYDROGENASE: LDH: 147 U/L (ref 98–192)

## 2020-12-28 LAB — BRAIN NATRIURETIC PEPTIDE: B Natriuretic Peptide: 359.6 pg/mL — ABNORMAL HIGH (ref 0.0–100.0)

## 2020-12-28 LAB — VITAMIN B12: Vitamin B-12: 463 pg/mL (ref 180–914)

## 2020-12-28 LAB — STREP PNEUMONIAE URINARY ANTIGEN: Strep Pneumo Urinary Antigen: NEGATIVE

## 2020-12-28 MED ORDER — INSULIN ASPART 100 UNIT/ML ~~LOC~~ SOLN: 0.0000 [IU] | Freq: Every day | SUBCUTANEOUS | Status: DC

## 2020-12-28 MED ORDER — ONDANSETRON HCL 4 MG PO TABS
4.0000 mg | ORAL_TABLET | Freq: Four times a day (QID) | ORAL | Status: DC | PRN
  Filled 2020-12-28: qty 1

## 2020-12-28 MED ORDER — INSULIN ASPART 100 UNIT/ML ~~LOC~~ SOLN
0.0000 [IU] | Freq: Three times a day (TID) | SUBCUTANEOUS | Status: DC
  Administered 2020-12-28: 3 [IU] via SUBCUTANEOUS
  Administered 2020-12-28: 1 [IU] via SUBCUTANEOUS
  Administered 2020-12-29: 3 [IU] via SUBCUTANEOUS
  Administered 2020-12-29: 2 [IU] via SUBCUTANEOUS
  Administered 2020-12-29: 3 [IU] via SUBCUTANEOUS
  Administered 2020-12-30 (×2): 9 [IU] via SUBCUTANEOUS
  Administered 2020-12-31: 3 [IU] via SUBCUTANEOUS
  Filled 2020-12-28 (×7): qty 1

## 2020-12-28 MED ORDER — FUROSEMIDE 10 MG/ML IJ SOLN
20.0000 mg | Freq: Once | INTRAMUSCULAR | Status: AC
  Administered 2020-12-28: 20 mg via INTRAVENOUS
  Filled 2020-12-28: qty 4

## 2020-12-28 MED ORDER — SODIUM CHLORIDE 0.9 % IV SOLN
1.0000 g | Freq: Once | INTRAVENOUS | Status: AC
  Administered 2020-12-28: 1 g via INTRAVENOUS
  Filled 2020-12-28: qty 1

## 2020-12-28 MED ORDER — INSULIN GLARGINE 100 UNIT/ML SOLOSTAR PEN: 30.0000 [IU] | PEN_INJECTOR | Freq: Every day | SUBCUTANEOUS | Status: DC

## 2020-12-28 MED ORDER — VANCOMYCIN HCL IN DEXTROSE 1-5 GM/200ML-% IV SOLN
1000.0000 mg | Freq: Once | INTRAVENOUS | Status: AC
  Administered 2020-12-28: 1000 mg via INTRAVENOUS
  Filled 2020-12-28: qty 200

## 2020-12-28 MED ORDER — INSULIN GLARGINE 100 UNIT/ML ~~LOC~~ SOLN
30.0000 [IU] | Freq: Every day | SUBCUTANEOUS | Status: DC
  Administered 2020-12-28: 30 [IU] via SUBCUTANEOUS
  Filled 2020-12-28 (×3): qty 0.3

## 2020-12-28 MED ORDER — AZITHROMYCIN 500 MG IV SOLR
500.0000 mg | INTRAVENOUS | Status: DC
  Administered 2020-12-28 – 2020-12-29 (×2): 500 mg via INTRAVENOUS
  Filled 2020-12-28 (×2): qty 500

## 2020-12-28 MED ORDER — ACETAMINOPHEN 325 MG PO TABS
650.0000 mg | ORAL_TABLET | Freq: Four times a day (QID) | ORAL | Status: DC | PRN
  Administered 2020-12-29 – 2020-12-30 (×2): 650 mg via ORAL
  Filled 2020-12-28 (×2): qty 2

## 2020-12-28 MED ORDER — KETOROLAC TROMETHAMINE 15 MG/ML IJ SOLN
15.0000 mg | Freq: Four times a day (QID) | INTRAMUSCULAR | Status: DC | PRN
  Administered 2020-12-28 (×2): 15 mg via INTRAVENOUS
  Filled 2020-12-28 (×2): qty 1

## 2020-12-28 MED ORDER — LINACLOTIDE 290 MCG PO CAPS: 290.0000 ug | ORAL_CAPSULE | Freq: Every day | ORAL | Status: DC | PRN

## 2020-12-28 MED ORDER — ENOXAPARIN SODIUM 30 MG/0.3ML ~~LOC~~ SOLN
30.0000 mg | SUBCUTANEOUS | Status: DC
  Administered 2020-12-28 – 2020-12-30 (×3): 30 mg via SUBCUTANEOUS
  Filled 2020-12-28 (×4): qty 0.3

## 2020-12-28 MED ORDER — FENTANYL CITRATE (PF) 100 MCG/2ML IJ SOLN
25.0000 ug | INTRAMUSCULAR | Status: DC | PRN
  Administered 2020-12-28 – 2020-12-29 (×4): 25 ug via INTRAVENOUS
  Filled 2020-12-28 (×4): qty 2

## 2020-12-28 MED ORDER — MAGNESIUM HYDROXIDE 400 MG/5ML PO SUSP
30.0000 mL | Freq: Every day | ORAL | Status: DC | PRN
  Filled 2020-12-28: qty 30

## 2020-12-28 MED ORDER — VANCOMYCIN HCL IN DEXTROSE 1-5 GM/200ML-% IV SOLN: 1000.0000 mg | INTRAVENOUS | Status: DC

## 2020-12-28 MED ORDER — SODIUM CHLORIDE 0.9 % IV SOLN
2.0000 g | Freq: Three times a day (TID) | INTRAVENOUS | Status: DC
  Administered 2020-12-28 – 2020-12-29 (×3): 2 g via INTRAVENOUS
  Filled 2020-12-28 (×5): qty 2

## 2020-12-28 MED ORDER — GABAPENTIN 300 MG PO CAPS: 300.0000 mg | ORAL_CAPSULE | Freq: Three times a day (TID) | ORAL | Status: DC

## 2020-12-28 MED ORDER — TRAZODONE HCL 50 MG PO TABS: 25.0000 mg | ORAL_TABLET | Freq: Every evening | ORAL | Status: DC | PRN

## 2020-12-28 MED ORDER — ONDANSETRON HCL 4 MG/2ML IJ SOLN
4.0000 mg | Freq: Four times a day (QID) | INTRAMUSCULAR | Status: DC | PRN
  Administered 2020-12-28: 4 mg via INTRAVENOUS
  Filled 2020-12-28: qty 2

## 2020-12-28 MED ORDER — FUROSEMIDE 10 MG/ML IJ SOLN
40.0000 mg | Freq: Two times a day (BID) | INTRAMUSCULAR | Status: DC
  Administered 2020-12-28 – 2020-12-31 (×6): 40 mg via INTRAVENOUS
  Filled 2020-12-28 (×6): qty 4

## 2020-12-28 MED ORDER — PANTOPRAZOLE SODIUM 40 MG PO TBEC
40.0000 mg | DELAYED_RELEASE_TABLET | Freq: Every day | ORAL | Status: DC
  Administered 2020-12-28 – 2020-12-31 (×4): 40 mg via ORAL
  Filled 2020-12-28 (×4): qty 1

## 2020-12-28 MED ORDER — VANCOMYCIN HCL 500 MG/100ML IV SOLN
500.0000 mg | Freq: Two times a day (BID) | INTRAVENOUS | Status: AC
  Administered 2020-12-28 – 2020-12-30 (×5): 500 mg via INTRAVENOUS
  Filled 2020-12-28 (×7): qty 100

## 2020-12-28 MED ORDER — ACETAMINOPHEN 650 MG RE SUPP: 650.0000 mg | Freq: Four times a day (QID) | RECTAL | Status: DC | PRN

## 2020-12-28 MED ORDER — TIOTROPIUM BROMIDE MONOHYDRATE 18 MCG IN CAPS: 18.0000 ug | ORAL_CAPSULE | Freq: Every day | RESPIRATORY_TRACT | Status: DC

## 2020-12-28 MED ORDER — CARVEDILOL 3.125 MG PO TABS
3.1250 mg | ORAL_TABLET | Freq: Two times a day (BID) | ORAL | Status: DC
Start: 2020-12-28 — End: 2020-12-30
  Administered 2020-12-28 – 2020-12-29 (×4): 3.125 mg via ORAL
  Filled 2020-12-28 (×4): qty 1

## 2020-12-28 NOTE — Progress Notes (Signed)
Inpatient Diabetes Program Recommendations  AACE/ADA: New Consensus Statement on Inpatient Glycemic Control (2015)  Target Ranges:  Prepandial:   less than 140 mg/dL      Peak postprandial:   less than 180 mg/dL (1-2 hours)      Critically ill patients:  140 - 180 mg/dL   Results for Chelsea, [name] (MRN [id number]) as of 12/28/2020 09:43  Ref. Range 12/28/2020 03:01 12/28/2020 04:21  Glucose Latest Ref Range: 70 - 99 mg/dL 628 (H) 366 (H)   Results for Chelsea, [name] (MRN [id number]) as of 12/28/2020 09:43  Ref. 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 Novolog Sensitive Correction Scale/ SSI (0-9 units) TID AC + HS  2. Novolog Meal Coverage: Novolog 4 unit TID with meals Hold if pt eats <50% of meal, Hold if pt NPO    --Will follow patient during hospitalization--  Ambrose Finland RN, MSN, CDE Diabetes Coordinator Inpatient Glycemic Control Team Team Pager: [phone] (8a-5p)

## 2020-12-28 NOTE — ED Notes (Signed)
Pt received meal tray. 

## 2020-12-28 NOTE — ED Notes (Signed)
Pts linen changed and new purwik in place by Clinical research associate. Pt provided with hospital gown.

## 2020-12-28 NOTE — Progress Notes (Addendum)
Pharmacy - Brief Note Vancomycin dosing  34 yof presents with shortness of breath.  Recent MRSA empyema, left AMA from [facility] on 2/27 with script for linezolid x 14d  Patient on vancomycin 1gm IV q12h at [facility] with a vancomycin trough = 14 mcg/ml  Plan  Based on current weight (40kg, lower than recent admission at [facility]) and SCr = 0.59 (appears to he higher vs. Some values at [facility]), adjust vancomycin to 500mg  IV q12h  Using SCr 0.59 (CrCl 52ml/min), provides AUC = 472 and pk = 30, trough = 13  Plan to check levels in ~3 days if remains on vancomycin.     93m, PharmD, BCPS.   Work Cell: [phone] 12/28/2020 2:22 PM

## 2020-12-28 NOTE — Progress Notes (Signed)
Patient admitted after midnight, please see H&P.  Needs further diuresis. Also added SSI as she is a type 1 DM and needs her blood sugars monitored regularly.  Hgb lower than prior so will trend closely with transfusion for <7.  Labs ordered for the AM. Marlin Canary DO

## 2020-12-28 NOTE — ED Notes (Signed)
RT called for VBG - tubing to lab

## 2020-12-28 NOTE — ED Triage Notes (Signed)
Pt arrived [facility] from her home where she called out due to increased SOB and fever. Pt diagnosed with COVID pneumonia and was in ICU at [facility] x14 days. Pt on 4L O2 on arrival due to oxygen level of 94% RA. Pt COVID positive on 2/14. Last ibuprofen taken at 2030 last night.

## 2020-12-28 NOTE — Progress Notes (Signed)
Pharmacy Antibiotic Note  [name] is a 35 y.o. female admitted on 12/28/2020 with pneumonia with recent MRSA bacteremia last month.   Pharmacy has been consulted for Vancomycin dosing.  Plan: Pt given LD of Vanc 1gm Vancomycin 1000 mg IV Q 24 hrs.  Goal AUC 400-550. Expected AUC: 472.5 SCr used: 0.59, TBW 40 kg < IBW 43.5 kg  Height: 5' (152.4 cm) Weight: 40 kg (88 lb 2.9 oz) IBW/kg (Calculated) : 45.5  Temp (24hrs), Avg:98 F (36.7 C), Min:98 F (36.7 C), Max:98 F (36.7 C)  Recent Labs  Lab 12/28/20 0301 12/28/20 0421  WBC 7.9  --   CREATININE 0.63 0.59  LATICACIDVEN  --  1.6    Estimated Creatinine Clearance: 61.4 mL/min (by C-G formula based on SCr of 0.59 mg/dL).    Allergies  Allergen Reactions  . Codeine Anaphylaxis and Nausea And Vomiting  . Penicillins Rash    As a teenager  . Tramadol Itching    Antimicrobials this admission: 3/14 Azithromycin >> x 5 days 3/14 Cefepime  >> x 5 days 3/14 Vancomycin >>  Microbiology results: 3/14 BCx: Pending 3/14 Sputum: Pending   Thank you for allowing pharmacy to be a part of this patient's care.  Otelia Sergeant, PharmD, [facility] 12/28/2020 6:23 AM

## 2020-12-28 NOTE — Consult Note (Signed)
CARDIOLOGY CONSULT NOTE               Patient ID: [name] MRN: [id number] DOB/AGE: 37-Mar-1987 37 y.o.  Admit date: 12/28/2020 Referring Physician [facility] Primary Physician [facility]  Primary Cardiologist none per patient Reason for Consultation acute on chronic systolic CHF  HPI: 35 year old female referred for evaluation of acute on chronic systolic CHF. The patient has a history of uncontrolled type I diabetes and polysubstance abuse. The patient was recently admitted to [facility] ICU for 13 days due to acute hypoxemic respiratory failure due to COVID pneumonia, bacterial pneumonia super infection (MRSA) with para-pneumonic effusion/empyema, septic shock requiring vasopressors and mechanical ventilation, anemia, and acute metabolic encephalopathy. 2D echocardiogram during this admission showed LVEF 30-35% with akinesis of mid and distal anterior, anteroseptal, apex, and mid and distal inferior wall. This was much lower compared to 02/2019 (EF 60%).The patient left AMA, but was prescribed Zyvox, carvedilol and Lasix at discharge. The patient was transported to [facility] ER this morning (3/14) for progressive shortness of breath. She denies any chest pain. She reports constant right axillary discomfort that is worse with breathing that was present during previous admission. She also reports that her peripheral edema was not resolved by the time she left the hospital.  In the ER, ECG showed sinus tachycardia at a rate of 106 bpm with nonspecific T wave abnormalities. Chest xray showed right lower lobe infection/inflammation with persistent trace to small volume right pleural effusion, trace left pleural effusion, and persistent coarsened and increase interstitial markings (pulmonary edema and infection/inflammation.) She was given IV Lasix and started on 2L of oxygen due to hypoxia (83%). Admission labs notable for normal renal function and electrolytes, normal high sensitivity  troponin x 2, normal lactic acid, BNP 359, hemoglobin 7.4 (down from 9.5 two weeks ago), hematocrit 23.6, WBC 7.9. At this time, the patient denies shortness of breath while on supplemental O2, chest pain, or palpitations.   Review of systems complete and found to be negative unless listed above     Past Medical History:  Diagnosis Date  . Acid reflux 2010  . Diabetes mellitus   . DKA 05/01/2011  . Polysubstance abuse [facility])     Past Surgical History:  Procedure Laterality Date  . TUBAL LIGATION    . WOUND EXPLORATION Left 07/26/2014   Procedure: Irrigation, debridement and exploration of elbow wounds;  Surgeon: Betha Loa, MD;  Location: [facility] OR;  Service: Orthopedics;  Laterality: Left;    (Not in a hospital admission)  Social History   Socioeconomic History  . Marital status: Single    Spouse name: Not on file  . Number of children: Not on file  . Years of education: Not on file  . Highest education level: Not on file  Occupational History  . Not on file  Tobacco Use  . Smoking status: Current Every Day Smoker    Packs/day: 1.00    Years: 15.00    Pack years: 15.00    Types: Cigarettes  . Smokeless tobacco: Never Used  . Tobacco comment: She is currently trying to quit.  Vaping Use  . Vaping Use: Never used  Substance and Sexual Activity  . Alcohol use: No  . Drug use: Yes    Types: Cocaine    Comment: 2-3 days ago  . Sexual activity: Yes    Birth control/protection: Surgical  Other Topics Concern  . Not on file  Social History Narrative   Currently does not have insurance.  Social Determinants of Health   Financial Resource Strain: Not on file  Food Insecurity: Not on file  Transportation Needs: Not on file  Physical Activity: Not on file  Stress: Not on file  Social Connections: Not on file  Intimate Partner Violence: Not on file    Family History  Problem Relation Age of Onset  . Coronary artery disease Mother   . Lung cancer Mother   . Heart  attack Father 66  . Thyroid disease Maternal Grandmother   . Diabetes type I Daughter       Review of systems complete and found to be negative unless listed above      PHYSICAL EXAM  General: Thin, malnourished-appearing, female lying in bed in no acute distress HEENT:  Normocephalic and atramatic Neck:  No JVD.  Lungs: normal effort of breathing on O2 via [place], diminished bibasilar breath sounds, crackles, no wheezing Heart: HRRR . Normal S1 and S2 without gallops or murmurs.  Abdomen: nondistended Msk:  Back normal. Normal strength and tone for age. Extremities: No clubbing, cyanosis or edema.   Neuro: Alert and oriented X 3. Psych: Flat affect, responds appropriately  Labs:   Lab Results  Component Value Date   WBC 7.9 12/28/2020   HGB 7.4 (L) 12/28/2020   HCT 23.6 (L) 12/28/2020   MCV 90.8 12/28/2020   PLT 363 12/28/2020    Recent Labs  Lab 12/28/20 0421  NA 141  K 4.0  CL 108  CO2 28  BUN 16  CREATININE 0.59  CALCIUM 8.0*  GLUCOSE 245*   Lab Results  Component Value Date   CKTOTAL 56 05/01/2011   CKMB 1.9 05/01/2011   TROPONINI <0.30 05/01/2011    Lab Results  Component Value Date   CHOL 139 03/29/2011   CHOL  01/01/2008    146        ATP III CLASSIFICATION:  <200     mg/dL   Desirable  889-169  mg/dL   Borderline High  >=450    mg/dL   High   Lab Results  Component Value Date   HDL 45 03/29/2011   HDL 23 (L) 01/01/2008   Lab Results  Component Value Date   LDLCALC 74 03/29/2011   LDLCALC  01/01/2008    90        Total Cholesterol/HDL:CHD Risk Coronary Heart Disease Risk Table                     Men   Women  1/2 Average Risk   3.4   3.3   Lab Results  Component Value Date   TRIG 360 (H) 11/30/2020   TRIG 100 03/29/2011   TRIG 166 (H) 01/01/2008   Lab Results  Component Value Date   CHOLHDL 3.1 03/29/2011   CHOLHDL 6.3 01/01/2008   No results found for: LDLDIRECT    Radiology: CT CHEST WO CONTRAST  Result Date:  12/03/2020 CLINICAL DATA:  COVID pneumonia. EXAM: CT CHEST WITHOUT CONTRAST TECHNIQUE: Multidetector CT imaging of the chest was performed following the standard protocol without IV contrast. COMPARISON:  Chest CT 05/18/2017 FINDINGS: Cardiovascular: The heart is normal in size. No pericardial effusion. The endotracheal tube is in good position just above the carina. There is an NG tube coursing down the esophagus and into the stomach. The right IJ central venous catheter is in good position. Mediastinum/Nodes: Borderline enlarged mediastinal and hilar lymph nodes likely reactive/inflammatory. Lungs/Pleura: Underlying emphysematous changes are noted with superimposed severe interstitial  process in the upper lobes with areas of cystic change which could be bronchiectasis or pneumatocele see. There is fairly dense airspace consolidation in both lower lobes with suspected areas of mild cavitation most notably in the right lower lobe medially. There are also small bilateral pleural effusions, right larger than left. No pneumothorax or pneumomediastinum. Upper Abdomen: Upper abdominal ascites is noted with fluid around the gallbladder, in the hepato renal fossa and pericolic gutters. Musculoskeletal: No significant bony findings. IMPRESSION: 1. Underlying emphysematous changes with superimposed severe interstitial process in the upper lobes with areas of cystic change which could be bronchiectasis or pneumatoceles. 2. Dense airspace consolidation in both lower lobes with suspected areas of mild cavitation most notably in the right lower lobe medially. 3. Small bilateral pleural effusions, right larger than left. 4. Upper abdominal ascites. 5. Lines and tubes in good position without complicating features. Emphysema (ICD10-J43.9). Electronically Signed   By: Rudie Meyer M.D.   On: 12/03/2020 13:20   DG Chest Portable 1 View  Result Date: 12/28/2020 CLINICAL DATA:  Shortness of breath. EXAM: PORTABLE CHEST 1 VIEW  COMPARISON:  Chest x-ray 12/07/2020, CT chest 12/03/2020 FINDINGS: The heart size and mediastinal contours are within normal limits. Biapical pleural/pulmonary scarring. Patchy airspace opacity of the right lower lobe and inferior portion of the right upper lobe is again noted. Persistent coarsened and increased interstitial markings. Redemonstration of a trace to small volume right pleural effusion. Blunting of the left costophrenic angle likely representing a trace pleural effusion. No pneumothorax. No acute osseous abnormality. IMPRESSION: 1. Right lower lobe infection/inflammation with persistent trace to small volume right pleural effusion. 2. Trace left pleural effusion. 3. Persistent coarsened and increased interstitial markings. Likely combination of pulmonary edema and infection/inflammation. Electronically Signed   By: Tish Frederickson M.D.   On: 12/28/2020 03:42   DG Chest Port 1 View  Result Date: 12/07/2020 CLINICAL DATA:  Dyspnea. EXAM: PORTABLE CHEST 1 VIEW COMPARISON:  December 05, 2020. FINDINGS: Improved diffuse interstitial opacities with slight worsening of dense left basilar consolidation. Small bilateral pleural effusions, likely slightly increased on the right. No visible pneumothorax. Cardiomediastinal silhouette is within normal limits. IMPRESSION: 1. Improved diffuse interstitial opacities with slight worsening of dense left basilar consolidation. 2. Small bilateral pleural effusions, likely slightly increased on the right. Electronically Signed   By: Feliberto Harts MD   On: 12/07/2020 13:42   DG Chest Port 1 View  Result Date: 12/05/2020 CLINICAL DATA:  COVID positive with severe MRSA septic shock EXAM: PORTABLE CHEST 1 VIEW COMPARISON:  12/02/2020 FINDINGS: Right arm PICC line tip is at the cavoatrial junction. Interval removal of NG tube and ET tube. Heart size is normal. Bilateral scratch set small bilateral pleural effusions are again noted. Diffuse interstitial and airspace  densities appear unchanged from previous exam. IMPRESSION: 1. Persistent bilateral pleural effusions and bilateral interstitial and airspace opacities compatible with multifocal atypical viral pneumonia. 2. Stable appearance of the right arm PICC line. Electronically Signed   By: Signa Kell M.D.   On: 12/05/2020 07:01   DG CHEST PORT 1 VIEW  Result Date: 12/02/2020 CLINICAL DATA:  Intubated, COVID EXAM: PORTABLE CHEST 1 VIEW COMPARISON:  12/01/2020 FINDINGS: Endotracheal tube is 3 cm above the carina. NG tube is in the stomach. Layering bilateral pleural effusions. Interstitial prominence throughout the lungs. Bilateral lower lobe airspace disease. No real change since prior study. IMPRESSION: Persistent interstitial prominence and pulmonary infiltrates. Layering bilateral effusions. No change. Electronically Signed   By: Caryn Bee  Dover M.D.   On: 12/02/2020 06:33   DG Chest Portable 1 View  Result Date: 12/01/2020 CLINICAL DATA:  RIGHT pleural effusion, COVID-19, sepsis EXAM: PORTABLE CHEST 1 VIEW COMPARISON:  Portable exam at 1345 hrs compared to 0921 hrs FINDINGS: Tip of endotracheal tube projects 1.9 cm above carina. NG tube extends into stomach. Stable heart size and mediastinal contours normal. Persistent diffuse pulmonary infiltrates unchanged. Persistent small RIGHT pleural effusion. No pneumothorax following RIGHT thoracentesis. IMPRESSION: No pneumothorax following diagnostic RIGHT thoracentesis. Persistent pulmonary infiltrates and small RIGHT pleural effusion. Electronically Signed   By: Ulyses SouthwardMark  Boles M.D.   On: 12/01/2020 14:20   DG CHEST PORT 1 VIEW  Result Date: 12/01/2020 CLINICAL DATA:  35 year old ventilated female status post central line placement. EXAM: PORTABLE CHEST 1 VIEW COMPARISON:  Chest x-ray 11/30/2020. FINDINGS: An endotracheal tube is in place with tip 3.3 cm above the carina. There is a right-sided internal jugular central venous catheter with tip terminating in the distal  superior vena cava. Widespread areas of ill-defined opacities and areas of interstitial prominence are noted throughout the lungs bilaterally, most evident throughout the mid to lower lungs, minimally improved compared to the prior examination. Moderate right and small left pleural effusions. No pneumothorax. Pulmonary vasculature is largely obscured. Heart size is normal. The patient is rotated to the left on today's exam, resulting in distortion of the mediastinal contours and reduced diagnostic sensitivity and specificity for mediastinal pathology. IMPRESSION: 1. Support apparatus, as above. 2. The appearance of the lungs is most compatible with severe multilobar bilateral pneumonia related to known COVID infection. Overall, aeration has slightly improved compared to the prior study. 3. Moderate right and small left pleural effusions similar to the prior examination. Electronically Signed   By: Trudie Reedaniel  Entrikin M.D.   On: 12/01/2020 09:37   DG CHEST PORT 1 VIEW  Result Date: 11/30/2020 CLINICAL DATA:  COVID-19 positive, tobacco abuse, intubated EXAM: PORTABLE CHEST 1 VIEW COMPARISON:  11/30/2020, 11/28/2020 FINDINGS: Single frontal view of the chest demonstrates interval placement of endotracheal tube overlying tracheal air column, tip 1.7 cm above carina. Enteric catheter passes below diaphragm tip excluded by collimation, side port projecting over the gastric body. Multifocal bilateral ground-glass airspace disease has progressed since prior study, which could reflect progressive pneumonia or superimposed edema. Stable right pleural effusion. No pneumothorax. IMPRESSION: 1. Support devices as above. 2. Progressive diffuse ground-glass airspace disease, which may reflect worsening pneumonia or superimposed edema. 3. Stable right pleural effusion. Electronically Signed   By: Sharlet SalinaMichael  Brown M.D.   On: 11/30/2020 20:07   DG Chest Port 1 View  Result Date: 11/30/2020 CLINICAL DATA:  Shortness of breath,  recent pneumonia in a 35 year old female EXAM: PORTABLE CHEST 1 VIEW COMPARISON:  November 28, 2020 FINDINGS: Interstitial and airspace disease worse than on the prior study, superimposed on a background of pulmonary emphysema. Interval development of a small RIGHT-sided pleural effusion. Trachea midline. Cardiomediastinal contours and hilar structures are stable. Nodular component to the airspace process with focal areas of nodularity the for example in the RIGHT upper and LEFT lower lobe. No acute skeletal process on limited assessment. IMPRESSION: 1. Worsening interstitial and airspace disease superimposed on a background of pulmonary emphysema. Findings suspicious for multifocal pneumonia. Given nodular component would suggest follow-up to ensure resolution and exclude underlying pulmonary nodules. 2. New small RIGHT-sided pleural effusion. Could also consider the possibility of an overlay of heart failure. Electronically Signed   By: Donzetta KohutGeoffrey  Wile M.D.   On: 11/30/2020  09:21   ECHOCARDIOGRAM COMPLETE  Result Date: 12/03/2020    ECHOCARDIOGRAM REPORT   Patient Name:   [name] Date of Exam: 12/03/2020 Medical Rec #:  [id number]         Height: Accession #:    [id number]        Weight: Date of Birth:  January 19, 1986         BSA: Patient Age:    34 years          BP:           120/89 mmHg Patient Gender: F                 HR:           77 bpm. [facility] Procedure: 2D Echo Indications:    Endocarditis I38  History:        Patient has prior history of Echocardiogram examinations, most                 recent 03/09/2019. Signs/Symptoms:Murmur; Risk Factors:Current                 Smoker and Diabetes. Substance induced mood disorder ,                 Pneumonia, Cocaine Abuse.  Sonographer:    Jeryl Columbia RDCS (AE) Referring Phys: [id number] RAKESH V ALVA IMPRESSIONS  1. Left ventricular ejection fraction, by estimation, is 30 to 35%. The left ventricle has moderately decreased function. The left  ventricle demonstrates regional wall motion abnormalities (see scoring diagram/findings for description). Left ventricular  diastolic parameters were normal.  2. Right ventricular systolic function is moderately reduced. The right ventricular size is normal. There is normal pulmonary artery systolic pressure. The estimated right ventricular systolic pressure is 22.0 mmHg.  3. The mitral valve is grossly normal. Mild to moderate mitral valve regurgitation.  4. The aortic valve is tricuspid. Aortic valve regurgitation is not visualized.  5. The inferior vena cava is normal in size with greater than 50% respiratory variability, suggesting right atrial pressure of 3 mmHg.  6. No obvious valvular vegetations. FINDINGS  Left Ventricle: Left ventricular ejection fraction, by estimation, is 30 to 35%. The left ventricle has moderately decreased function. The left ventricle demonstrates regional wall motion abnormalities. The left ventricular internal cavity size was normal in size. There is no left ventricular hypertrophy. Left ventricular diastolic parameters were normal.  LV Wall Scoring: The mid and distal anterior wall, mid and distal anterior septum, entire apex, mid and distal inferior wall, and mid inferoseptal segment are akinetic. The basal anteroseptal segment, mid inferolateral segment, and mid anterolateral segment are hypokinetic. The basal inferolateral segment, basal anterolateral segment, basal anterior segment, basal inferior segment, and basal inferoseptal segment are normal. Right Ventricle: The right ventricular size is normal. No increase in right ventricular wall thickness. Right ventricular systolic function is moderately reduced. There is normal pulmonary artery systolic pressure. The tricuspid regurgitant velocity is 2.18 m/s, and with an assumed right atrial pressure of 3 mmHg, the estimated right ventricular systolic pressure is 22.0 mmHg. Left Atrium: Left atrial size was normal in size. Right  Atrium: Right atrial size was normal in size. Pericardium: There is no evidence of pericardial effusion. Mitral Valve: The mitral valve is grossly normal. Mild to moderate mitral valve regurgitation, with eccentric posteriorly directed jet. Tricuspid Valve: The tricuspid valve is grossly normal. Tricuspid valve regurgitation is mild. Aortic Valve: The aortic valve is tricuspid. Aortic  valve regurgitation is not visualized. Pulmonic Valve: The pulmonic valve was grossly normal. Pulmonic valve regurgitation is not visualized. Aorta: The aortic root is normal in size and structure. Venous: The inferior vena cava is normal in size with greater than 50% respiratory variability, suggesting right atrial pressure of 3 mmHg. IAS/Shunts: No atrial level shunt detected by color flow Doppler.  LEFT VENTRICLE PLAX 2D LVIDd:         3.72 cm  Diastology LVIDs:         3.15 cm  LV e' medial:    6.53 cm/s LV PW:         1.00 cm  LV E/e' medial:  9.7 LV IVS:        0.80 cm  LV e' lateral:   6.42 cm/s LVOT diam:     1.80 cm  LV E/e' lateral: 9.9 LVOT Area:     2.54 cm  RIGHT VENTRICLE RV S prime:     7.29 cm/s LEFT ATRIUM             RIGHT ATRIUM LA diam:        2.20 cm RA Area:     9.12 cm LA Vol (A2C):   24.3 ml RA Volume:   20.90 ml LA Vol (A4C):   28.3 ml LA Biplane Vol: 27.0 ml   AORTA Ao Root diam: 2.40 cm MITRAL VALVE               TRICUSPID VALVE MV Area (PHT): 4.21 cm    TR Peak grad:   19.0 mmHg MV Decel Time: 180 msec    TR Vmax:        218.00 cm/s MV E velocity: 63.40 cm/s MV A velocity: 70.70 cm/s  SHUNTS MV E/A ratio:  0.90        Systemic Diam: 1.80 cm Nona Dell MD Electronically signed by Nona Dell MD Signature Date/Time: 12/03/2020/4:11:56 PM    Final    [place] EKG SITE RITE  Result Date: 12/04/2020 If Site Rite image not attached, placement could not be confirmed due to current cardiac rhythm.  US Abdomen Limited RUQ (LIVER/GB)  Result Date: 11/30/2020 CLINICAL DATA:  Right upper quadrant pain and  elevated LFTs EXAM: ULTRASOUND ABDOMEN LIMITED RIGHT UPPER QUADRANT COMPARISON:  None. FINDINGS: Gallbladder: Gallbladder is well distended. Mild gallbladder sludge is noted. No gallstones are seen. Common bile duct: Diameter: 3.2 mm. Liver: Mild increased echogenicity consistent with fatty infiltration. No mass lesion is noted. Portal vein is patent on color Doppler imaging with normal direction of blood flow towards the liver. Other: None. IMPRESSION: Fatty infiltration of the liver. Gallbladder sludge.  No cholelithiasis or wall thickening is noted. Electronically Signed   By: Alcide Clever M.D.   On: 11/30/2020 12:50   US THORACENTESIS ASP PLEURAL SPACE W/IMG GUIDE  Result Date: 12/01/2020 INDICATION: COVID-19 pneumonia, sepsis, small RIGHT pleural effusion EXAM: ULTRASOUND GUIDED DIAGNOSTIC RIGHT THORACENTESIS MEDICATIONS: None. COMPLICATIONS: None immediate. PROCEDURE: An ultrasound guided thoracentesis was thoroughly discussed with the patient and questions answered. The benefits, risks, alternatives and complications were also discussed. The patient understands and wishes to proceed with the procedure. Written consent was obtained. Ultrasound was performed to localize and mark an adequate pocket of fluid in the RIGHT chest. The area was then prepped and draped in the normal sterile fashion. 1% Lidocaine was used for local anesthesia. Under ultrasound guidance a a syringe with a 22 gauge needle was advanced into the small RIGHT pleural effusion. 16 cc  of fluid was performed. The catheter was removed and a dressing applied. FINDINGS: A total of approximately 16 mL of slightly cloudy yellow fluid was removed. Sample was sent to the laboratory as requested by the clinical team. IMPRESSION: Successful ultrasound guided right thoracentesis yielding 16 mL of pleural fluid. Electronically Signed   By: Ulyses Southward M.D.   On: 12/01/2020 14:24    EKG: sinus rhythm  ASSESSMENT AND PLAN:  1. Acute on chronic  systolic CHF, with LVEF 30-35% per recent echocardiogram last month, which is much lower compared to echo in 2020, which showed LVEF 60%; possibly due to polysubstance abuse. BNP >300. Started on IV Lasix.  2. Right lobar pneumonia with recent history of MRSA pneumonia with parapneumonic effusion, started on antibiotics 3. Anemia, hemoglobin currently 7.4 (down from 9.5 two weeks ago), hematocrit 23.6 4. Uncontrolled type I diabetes  Recommendations: 1. Agree with current therapy 2. Continue IV Lasix with careful monitoring of blood pressure and renal function 3. Continue antibiotics 4. Transfuse as indicated 5. Continue carvedilol 6. Add ACEi or ARB if blood pressure permits 7. Heart failure consult 8. Strongly encourage patient to avoid drugs 9. Further recommendations pending patient's initial course  Signed: Rockie Neighbours 12/28/2020, 9:18 AM

## 2020-12-28 NOTE — H&P (Addendum)
[place]   PATIENT NAME: [name]    MR#:  [id number]  DATE OF BIRTH:  1986/01/27  DATE OF ADMISSION:  12/28/2020  PRIMARY CARE PHYSICIAN: [facility]   Patient is coming from: Home REQUESTING/REFERRING PHYSICIAN: Nita Sickle, MD  CHIEF COMPLAINT:   Chief Complaint  Patient presents with  . Shortness of Breath  . Fever    HISTORY OF PRESENT ILLNESS:  [name] is a 35 y.o. 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  She admitted to 3-4 pillow orthopnea with paroxysmal nocturnal dyspnea as well as dyspnea on exertion with dry cough and wheezing or other symptoms.  She has been having low-grade fever without chills.  No nausea or vomiting or abdominal pain.  She has been taking her Zyvox that she was discharged on and has 3 remaining tablets of note she left AMA and was given a prescription to complete 4-week of antibiotic therapy for MRSA.  She admits to right-sided chest pain that is mainly with deep breathing.  No melena or bright red bleeding per rectum.  No other bleeding diathesis.  ED Course: When she came to the ER blood pressure was 140/97 with a heart rate of 103 and respiratory to 29 and pulse oximetry was 97% on room air.  Upon ambulation however her pulse oximetry dropped to 83% on room air.  She was afebrile.  Labs were remarkable for anemia with hemoglobin of 7.4 and hematocrit 23.6 compared to 9.5 and 29.1 point 12/12/2020.  Her  BNP came back 359.6 and high-sensitivity troponin I 8.  BMP was remarkable for hyperglycemia of 278.  Urine pregnancy test was negative. EKG as reviewed by me : Showed sinus tachycardia with rate 106 with T wave inversion in V1 and V2 and laterally. Imaging: Chest x-ray showed right lower lobe infection/inflammation with persistent trace to small volume right pleural effusion, trace left pleural effusion and persistent coarsened and increased interstitial markings likely combination of pulmonary edema and infection/inflammation.  The patient was given IV cefepime and vancomycin as well as 20 mg of IV Lasix.  She will be admitted to a progressive unit bed for further evaluation and management. PAST MEDICAL HISTORY:   Past Medical History:  Diagnosis Date  . Acid reflux 2010  . Diabetes mellitus   . DKA 05/01/2011  . Polysubstance abuse (HCC)   -COVID-19 with subsequent acute hypoxemic respiratory failure. -MRSA pneumonia and sepsis. -Systolic CHF likely secondary to cocaine  PAST SURGICAL HISTORY:   Past Surgical History:  Procedure Laterality Date  . TUBAL LIGATION    . WOUND EXPLORATION Left 07/26/2014   Procedure: Irrigation, debridement and exploration of elbow wounds;  Surgeon: Betha Loa, MD;  Location: [facility];  Service: Orthopedics;  Laterality: Left;    SOCIAL HISTORY:   Social History   Tobacco Use  . Smoking status: Current Every Day Smoker    Packs/day: 1.00    Years: 15.00    Pack years: 15.00    Types: Cigarettes  . Smokeless tobacco:  Never Used  . Tobacco comment: She is currently trying to quit.  Substance Use Topics  . Alcohol use: No    FAMILY HISTORY:   Family History  Problem Relation Age of Onset  . Coronary artery disease Mother   . Lung cancer Mother   . Heart attack Father 61  . Thyroid disease Maternal Grandmother   . Diabetes type I Daughter     DRUG ALLERGIES:   Allergies  Allergen Reactions  . Codeine Anaphylaxis and Nausea And  Vomiting  . Penicillins Rash    As a teenager  . Tramadol Itching    REVIEW OF SYSTEMS:   ROS As per history of present illness. All pertinent systems were reviewed above. Constitutional, HEENT, cardiovascular, respiratory, GI, GU, musculoskeletal, neuro, psychiatric, endocrine, integumentary and hematologic systems were reviewed and are otherwise negative/unremarkable except for positive findings mentioned above in the HPI.   MEDICATIONS AT HOME:   Prior to Admission medications   Medication Sig Start Date End Date Taking? Authorizing Provider  carvedilol (COREG) 6.25 MG tablet Take 0.5 tablets (3.125 mg total) by mouth 2 (two) times daily with a meal. 12/13/20   Tat, Onalee Hua, MD  furosemide (LASIX) 20 MG tablet Take 1 tablet (20 mg total) by mouth daily. 12/13/20   Catarina Hartshorn, MD  gabapentin (NEURONTIN) 300 MG capsule Take 300 mg by mouth 3 (three) times daily.  08/24/18   [provider]  insulin aspart (NOVOLOG FLEXPEN) 100 UNIT/ML FlexPen Inject 1-15 Units into the skin 3 (three) times daily with meals. Dose based on CBG and carb count, 1 unit for every 15 carb.    [provider]  insulin glargine (LANTUS SOLOSTAR) 100 UNIT/ML Solostar Pen Inject 30 Units into the skin daily at 10 pm. 12/13/20 02/11/21  Tat, Onalee Hua, MD  linaclotide (LINZESS) 290 MCG CAPS capsule Take 290 mcg by mouth daily as needed (for constipation).     [provider]  linezolid (ZYVOX) 600 MG tablet Take 1 tablet (600 mg total) by mouth 2 (two) times daily. 12/13/20   Catarina Hartshorn, MD  omeprazole (PRILOSEC) 40 MG capsule Take 40 mg by mouth daily before breakfast.     [provider]  tiotropium (SPIRIVA) 18 MCG inhalation capsule Place 18 mcg into inhaler and inhale daily.    [provider]      VITAL SIGNS:  Blood pressure (!) 152/95, pulse (!) 102, temperature 98 F (36.7 C), temperature source Oral, resp. rate 15, height 5' (1.524 m), weight 40 kg, SpO2 100  %.  PHYSICAL EXAMINATION:  Physical Exam  GENERAL:  35 y.o.-year-old Caucasian female patient lying in the bed with mild respiratory stress with conversational dyspnea. EYES: Pupils equal, round, reactive to light and accommodation. No scleral icterus. Extraocular muscles intact.  HEENT: Head atraumatic, normocephalic. Oropharynx and nasopharynx clear.  NECK:  Supple, no jugular venous distention. No thyroid enlargement, no tenderness.  LUNGS: Diminished bibasal and right midlung zone breath sounds with bibasal crackles . CARDIOVASCULAR: Regular rate and rhythm, S1, S2 normal. No murmurs, rubs, or gallops.  ABDOMEN: Soft, nondistended, nontender. Bowel sounds present. No organomegaly or mass.  EXTREMITIES: Trace to 1+ bilateral pitting lower extremity edema with no cyanosis, or clubbing.  NEUROLOGIC: Cranial nerves II through XII are intact. Muscle strength 5/5 in all extremities. Sensation intact. Gait not checked.  PSYCHIATRIC: The patient is alert and oriented x 3.  Normal affect and good eye contact. SKIN: No obvious rash, lesion, or ulcer.   LABORATORY PANEL:  CBC Recent Labs  Lab 12/28/20 0301  WBC 7.9  HGB 7.4*  HCT 23.6*  PLT 363   ------------------------------------------------------------------------------------------------------------------  Chemistries  Recent Labs  Lab 12/28/20 0301  NA 139  K 4.1  CL 105  CO2 29  GLUCOSE 278*  BUN 17  CREATININE 0.63  CALCIUM 8.2*   ------------------------------------------------------------------------------------------------------------------  Cardiac Enzymes No results for input(s): TROPONINI in the last 168 hours. ------------------------------------------------------------------------------------------------------------------  RADIOLOGY:  DG Chest Portable 1 View  Result Date: 12/28/2020 CLINICAL DATA:  Shortness of breath. EXAM: PORTABLE CHEST 1 VIEW COMPARISON:  Chest x-ray 12/07/2020, CT chest 12/03/2020  FINDINGS: The heart size and mediastinal contours are within normal limits. Biapical pleural/pulmonary scarring. Patchy airspace opacity of the right lower lobe and inferior portion of the right upper lobe is again noted. Persistent coarsened and increased interstitial markings. Redemonstration of a trace to small volume right pleural effusion. Blunting of the left costophrenic angle likely representing a trace pleural effusion. No pneumothorax. No acute osseous abnormality. IMPRESSION: 1. Right lower lobe infection/inflammation with persistent trace to small volume right pleural effusion. 2. Trace left pleural effusion. 3. Persistent coarsened and increased interstitial markings. Likely combination of pulmonary edema and infection/inflammation. Electronically Signed   By: Tish Frederickson M.D.   On: 12/28/2020 03:42      IMPRESSION AND PLAN:  Active Problems:   Acute CHF (congestive heart failure) (HCC)  1.  Acute on chronic systolic CHF with pulmonary edema. -Most recent 2D echo on 12/03/2020 revealed EF of 30 to 35%. -The patient will be admitted to a progressive unit bed. -We will diurese with IV Lasix. -We will continue her Coreg. -We will follow serial troponin I's. -Cardiac consult will be obtained. -I notified Dr. Lady Gary about the patient.  2.  Right lobar pneumonia with history of recent MRSA pneumonia with parapneumonic effusion. -I do not believe that this is a hospital-acquired infection as the patient was admitted last month with pneumonia prior to hospitalization and that has improved clinically during the last couple of weeks during which she was treated with p.o. Zyvox after leaving AMA for MRSA pneumonia and bacteremia. -We will continue antibiotic therapy with IV cefepime and Zithromax in addition to IV vancomycin. -We will follow blood and sputum culture. -Pneumonia antigens will be ordered..  3.  Uncontrolled type 1 diabetes mellitus with hyperglycemia and peripheral  neuropathy. -The patient will be placed on supplemental coverage with NovoLog. -We will continue her basal coverage. -We will continue Neurontin.  4.  Acute on chronic anemia. -We will obtain anemia work-up including stool Hemoccult and follow CBC.  5.  GERD. -We will continue PPI therapy.  6.  IBS. -We will continue Linzess.  DVT prophylaxis: Lovenox. Code Status: full code. Family Communication:  The plan of care was discussed in details with the patient (and family). I answered all questions. The patient agreed to proceed with the above mentioned plan. Further management will depend upon hospital course. Disposition Plan: Back to previous home environment Consults called: Cardiology consult.   All the records are reviewed and case discussed with ED provider.  Status is: Inpatient  Remains inpatient appropriate because:Ongoing diagnostic testing needed not appropriate for outpatient work up, Unsafe d/c plan, IV treatments appropriate due to intensity of illness or inability to take PO and Inpatient level of care appropriate due to severity of illness   Dispo: The patient is from: Home              Anticipated d/c is to: Home  Patient currently is not medically stable to d/c.   Difficult to place patient No   TOTAL TIME TAKING CARE OF THIS PATIENT: 65 minutes.    Hannah Beat M.D on 12/28/2020 at 4:59 AM  [facility]   From 7 PM-7 AM, contact night-coverage [email or web address]  CC: Primary care physician; [facility]

## 2020-12-28 NOTE — Progress Notes (Signed)
PHARMACIST - PHYSICIAN COMMUNICATION  CONCERNING:  Enoxaparin (Lovenox) for DVT Prophylaxis    RECOMMENDATION: Patient was prescribed enoxaprin 40mg  q24 hours for VTE prophylaxis.   Filed Weights   12/28/20 0258  Weight: 40 kg (88 lb 2.9 oz)    Body mass index is 17.22 kg/m.  Estimated Creatinine Clearance: 61.4 mL/min (by C-G formula based on SCr of 0.63 mg/dL).  Patient is candidate for enoxaparin 30mg  every 24 hours based on CrCl <12ml/min or Weight <45kg  DESCRIPTION: Pharmacy has adjusted enoxaparin dose per [facility] policy.  Patient is now receiving enoxaparin 30 mg every 24 hours   31m, PharmD, [facility] 12/28/2020 5:21 AM

## 2020-12-28 NOTE — ED Notes (Signed)
Report given to Russell RN.

## 2020-12-28 NOTE — ED Provider Notes (Signed)
[facility] Emergency Department Provider Note  ____________________________________________  Time seen: Approximately 4:16 AM  I have reviewed the triage vital signs and the nursing notes.   HISTORY  Chief Complaint Shortness of Breath and Fever   HPI [name] is a 35 y.o. female with history of type 1 diabetes, systolic CHF, cocaine abuse, recent prolonged admission for COVID-19 with superimposed bacterial pneumonia for which patient was in the ICU and intubated who presents for evaluation of shortness of breath.   Patient was hospitalized for 13 days and discharged home 2 weeks ago for Covid and bacterial pneumonia.  She reports that she was feeling improved until 2 days ago when she started feeling short of breath.  The shortness of breath is becoming progressively worse which prompted her visit to the ER.  She is complaining of dull right upper chest pain which she has had since the prior admission.  She denies pleuritic chest pain, she denies cough, she denies fever, she denies abdominal pain, nausea or vomiting.  She reports that her sugars have been up and down.  She denies any changes in bilateral lower extremity edema.  She endorses compliance with her Lasix.  She reports that she finished the full course of antibiotics.  Past Medical History:  Diagnosis Date  . Acid reflux 2010  . Diabetes mellitus   . DKA 05/01/2011  . Polysubstance abuse [facility])     Patient Active Problem List   Diagnosis Date Noted  . Systolic CHF, acute ----EF 30 to 35 % 12/03/2020  . 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 Pneumonia due to COVID-19 virus 12/01/2020  . Acute Hypoxic respiratory failure due to COVID-19 (HCC) 12/01/2020  . Sepsis secondary to community-acquired pneumonia with parapneumonic effusion 12/01/2020  . CAP (community acquired pneumonia)--- with parapneumonic effusion/MRSA Empyema 12/01/2020  . Severe MRSA sepsis with septic shock  -secondary to community-acquired pneumonia with parapneumonic effusion/Empyema 12/01/2020  . DKA, type 1 (HCC) 11/30/2020  . Hyperosmolar hyperglycemic state (HHS) (HCC) 04/06/2020  . Generalized abdominal pain   . Cocaine abuse (HCC)   . Pyelonephritis 03/08/2019  . Abscess   . Systolic murmur   . Hyperglycemia due to type 1 diabetes mellitus (HCC) 10/28/2018  . Substance abuse (HCC) 09/15/2018  . Tobacco abuse 09/15/2018  . Cough 09/15/2018  . Protein-calorie malnutrition, severe (HCC) 09/15/2018  . GERD (gastroesophageal reflux disease) 09/15/2018  . Nausea & vomiting   . Acute lower UTI 07/14/2016  . Panic disorder without agoraphobia with moderate panic attacks 01/20/2016  . Benzodiazepine abuse (HCC) 01/20/2016  . Substance induced mood disorder (HCC) 01/19/2016  . DKA 05/01/2011  . Type 1 diabetes mellitus with other specified complication (HCC) 05/01/2011  . Leukocytosis 05/01/2011    Past Surgical History:  Procedure Laterality Date  . TUBAL LIGATION    . WOUND EXPLORATION Left 07/26/2014   Procedure: Irrigation, debridement and exploration of elbow wounds;  Surgeon: Betha Loa, MD;  Location: [facility] OR;  Service: Orthopedics;  Laterality: Left;    Prior to Admission medications   Medication Sig Start Date End Date Taking? Authorizing Provider  carvedilol (COREG) 6.25 MG tablet Take 0.5 tablets (3.125 mg total) by mouth 2 (two) times daily with a meal. 12/13/20   Tat, Onalee Hua, MD  furosemide (LASIX) 20 MG tablet Take 1 tablet (20 mg total) by mouth daily. 12/13/20   Catarina Hartshorn, MD  gabapentin (NEURONTIN) 300 MG capsule Take 300 mg by mouth 3 (three) times daily.  08/24/18   [provider]  insulin aspart (NOVOLOG FLEXPEN) 100 UNIT/ML FlexPen Inject 1-15 Units into the skin 3 (three) times daily with meals. Dose based on CBG and carb count, 1 unit for every 15 carb.    [provider]  insulin glargine (LANTUS SOLOSTAR) 100 UNIT/ML Solostar Pen Inject 30 Units  into the skin daily at 10 pm. 12/13/20 02/11/21  Tat, Onalee Hua, MD  linaclotide (LINZESS) 290 MCG CAPS capsule Take 290 mcg by mouth daily as needed (for constipation).     [provider]  linezolid (ZYVOX) 600 MG tablet Take 1 tablet (600 mg total) by mouth 2 (two) times daily. 12/13/20   Catarina Hartshorn, MD  omeprazole (PRILOSEC) 40 MG capsule Take 40 mg by mouth daily before breakfast.     [provider]  tiotropium (SPIRIVA) 18 MCG inhalation capsule Place 18 mcg into inhaler and inhale daily.    [provider]    Allergies Codeine, Penicillins, and Tramadol  Family History  Problem Relation Age of Onset  . Coronary artery disease Mother   . Lung cancer Mother   . Heart attack Father 59  . Thyroid disease Maternal Grandmother   . Diabetes type I Daughter     Social History Social History   Tobacco Use  . Smoking status: Current Every Day Smoker    Packs/day: 1.00    Years: 15.00    Pack years: 15.00    Types: Cigarettes  . Smokeless tobacco: Never Used  . Tobacco comment: She is currently trying to quit.  Vaping Use  . Vaping Use: Never used  Substance Use Topics  . Alcohol use: No  . Drug use: Yes    Types: Cocaine    Comment: 2-3 days ago    Review of Systems  Constitutional: Negative for fever. Eyes: Negative for visual changes. ENT: Negative for sore throat. Neck: No neck pain  Cardiovascular: Negative for chest pain. Respiratory: + shortness of breath. Gastrointestinal: Negative for abdominal pain, vomiting or diarrhea. Genitourinary: Negative for dysuria. Musculoskeletal: Negative for back pain. Skin: Negative for rash. Neurological: Negative for headaches, weakness or numbness. Psych: No SI or HI  ____________________________________________   PHYSICAL EXAM:  VITAL SIGNS: ED Triage Vitals  Enc Vitals Group     BP 12/28/20 0302 (!) 140/97     Pulse Rate 12/28/20 0302 (!) 107     Resp 12/28/20 0302 (!) 24     Temp 12/28/20  0302 98 F (36.7 C)     Temp Source 12/28/20 0302 Oral     SpO2 12/28/20 0302 94 %     Weight 12/28/20 0258 88 lb 2.9 oz (40 kg)     Height 12/28/20 0258 5' (1.524 m)     Head Circumference --      Peak Flow --      Pain Score 12/28/20 0307 9     Pain Loc --      Pain Edu? --      Excl. in GC? --     Constitutional: Alert and oriented, chronically ill appearing HEENT:      Head: Normocephalic and atraumatic.         Eyes: Conjunctivae are normal. Sclera is non-icteric.       Mouth/Throat: Mucous membranes are moist.       Neck: Supple with no signs of meningismus. Cardiovascular: Tachycardic with regular rhythm Respiratory: Tachypneic but satting 94% on room air, absent breath sounds on the right from mid lung to  the base, faint crackles on the left Gastrointestinal: Soft, non tender, and non distended with positive bowel sounds. No rebound or guarding. Musculoskeletal: 2+ pitting edema bilaterally  neurologic: Normal speech and language. Face is symmetric. Moving all extremities. No gross focal neurologic deficits are appreciated. Skin: Skin is warm, dry and intact. No rash noted. Psychiatric: Mood and affect are normal. Speech and behavior are normal.  ____________________________________________   LABS (all labs ordered are listed, but only abnormal results are displayed)  Labs Reviewed  BASIC METABOLIC PANEL - Abnormal; Notable for the following components:      Result Value   Glucose, Bld 278 (*)    Calcium 8.2 (*)    All other components within normal limits  CBC - Abnormal; Notable for the following components:   RBC 2.60 (*)    Hemoglobin 7.4 (*)    HCT 23.6 (*)    RDW 16.8 (*)    All other components within normal limits  BRAIN NATRIURETIC PEPTIDE - Abnormal; Notable for the following components:   B Natriuretic Peptide 359.6 (*)    All other components within normal limits  CULTURE, BLOOD (ROUTINE X 2)  CULTURE, BLOOD (ROUTINE X 2)  BLOOD GAS, VENOUS   LACTIC ACID, PLASMA  LACTIC ACID, PLASMA  POC URINE PREG, ED  TYPE AND SCREEN  TROPONIN I (HIGH SENSITIVITY)  TROPONIN I (HIGH SENSITIVITY)   ____________________________________________  EKG  ED ECG REPORT I, Nita Sickle, the attending physician, personally viewed and interpreted this ECG.  Sinus tachycardia, rate of 106, normal intervals with no ST elevations or depressions.  T wave inversions in 1, aVL, and V2.  Improved compared to prior ____________________________________________  RADIOLOGY  I have personally reviewed the images performed during this visit and I agree with the Radiologist's read.   Interpretation by Radiologist:  DG Chest Portable 1 View  Result Date: 12/28/2020 CLINICAL DATA:  Shortness of breath. EXAM: PORTABLE CHEST 1 VIEW COMPARISON:  Chest x-ray 12/07/2020, CT chest 12/03/2020 FINDINGS: The heart size and mediastinal contours are within normal limits. Biapical pleural/pulmonary scarring. Patchy airspace opacity of the right lower lobe and inferior portion of the right upper lobe is again noted. Persistent coarsened and increased interstitial markings. Redemonstration of a trace to small volume right pleural effusion. Blunting of the left costophrenic angle likely representing a trace pleural effusion. No pneumothorax. No acute osseous abnormality. IMPRESSION: 1. Right lower lobe infection/inflammation with persistent trace to small volume right pleural effusion. 2. Trace left pleural effusion. 3. Persistent coarsened and increased interstitial markings. Likely combination of pulmonary edema and infection/inflammation. Electronically Signed   By: Tish Frederickson M.D.   On: 12/28/2020 03:42     ____________________________________________   PROCEDURES  Procedure(s) performed:yes .1-3 Lead EKG Interpretation Performed by: Nita Sickle, MD Authorized by: Nita Sickle, MD     Interpretation: non-specific     ECG rate assessment:  tachycardic     Rhythm: sinus tachycardia     Ectopy: none     Critical Care performed: yes  CRITICAL CARE Performed by: Nita Sickle  ?  Total critical care time: 40 min  Critical care time was exclusive of separately billable procedures and treating other patients.  Critical care was necessary to treat or prevent imminent or life-threatening deterioration.  Critical care was time spent personally by me on the following activities: development of treatment plan with patient and/or surrogate as well as nursing, discussions with consultants, evaluation of patient's response to treatment, examination of patient, obtaining history from  patient or surrogate, ordering and performing treatments and interventions, ordering and review of laboratory studies, ordering and review of radiographic studies, pulse oximetry and re-evaluation of patient's condition.  ____________________________________________   INITIAL IMPRESSION / ASSESSMENT AND PLAN / ED COURSE  35 y.o. female with history of type 1 diabetes, systolic CHF, cocaine abuse, recent prolonged admission for COVID-19 with superimposed bacterial pneumonia for which patient was in the ICU and intubated who presents for evaluation of shortness of breath.   Patient reports worsening shortness of breath over the last 2 days.  Has been home for 2 weeks after being admitted for 13 days requiring intubation and ICU stay for Covid with superimposed bacterial pneumonia.  No longer on antibiotics.  She is tachycardic and tachypneic but no hypoxia.  She was put on 2 L nasal cannula for comfort.  She has absent breath sounds on the right with crackles on the left.  She looks volume overloaded with 2+ pitting edema.  Chest x-ray visualized by me showing worsening right lower lobe pneumonia with possible superimposed edema, confirmed by radiology.  She is afebrile with a normal white count therefore I do not suspect sepsis at this time.  Since patient  seems to be volume overloaded I will hold off giving her fluids at this time and instead will give her Lasix for diuresis.  Will cover with cefepime and Vanco.  We will send cultures and a lactic acid.  Luckily the rest of her blood work does not show signs of DKA at this time.  She does have worsening anemia most likely from chronic disease.  No signs of active bleeding.  We will send a type and screen I discussed with the hospitalist for admission.  I extensively reviewed the notes from patient's recent admission.  Patient was placed on telemetry for close monitoring of cardiorespiratory status.   _________________________ 4:25 AM on 12/28/2020 -----------------------------------------  Just informed by patient's nurse that she ambulated from the stretcher to the commode right at bedside and drop her sats to 83%.  Sats improved when she was back in the stretcher on 2 L of oxygen.  Hospitalist informed of that.       _____________________________________________ Please note:  Patient was evaluated in Emergency Department today for the symptoms described in the history of present illness. Patient was evaluated in the context of the global COVID-19 pandemic, which necessitated consideration that the patient might be at risk for infection with the SARS-CoV-2 virus that causes COVID-19. Institutional protocols and algorithms that pertain to the evaluation of patients at risk for COVID-19 are in a state of rapid change based on information released by regulatory bodies including the CDC and federal and state organizations. These policies and algorithms were followed during the patient's care in the ED.  Some ED evaluations and interventions may be delayed as a result of limited staffing during the pandemic.   [place] Controlled Substance Database was reviewed by me. ____________________________________________   FINAL CLINICAL IMPRESSION(S) / ED DIAGNOSES   Final diagnoses:  Acute respiratory failure  with hypoxia (HCC)  Lobar pneumonia (HCC)  Acute on chronic congestive heart failure, unspecified heart failure type (HCC)  Acute on chronic anemia      NEW MEDICATIONS STARTED DURING THIS VISIT:  ED Discharge Orders    None       Note:  This document was prepared using Dragon voice recognition software and may include unintentional dictation errors.    Nita Sickle, MD 12/28/20 [phone]

## 2020-12-28 NOTE — ED Notes (Signed)
Cardiology at bedside.

## 2020-12-28 NOTE — Consult Note (Addendum)
   Heart Failure Nurse Navigator Note  HFrEF 30-35%.  Wall motion abnormalities noted.  Right ventricular systolic function moderately reduced.  Mild to moderate mitral regurgitation.   She presented to the emergency room with complaints of needing dyspnea over the last few days, worsening this evening.  Also noted 3-4 pillow orthopnea with PND, dyspnea on exertion, wheezing and a dry cough  Comorbidities:  Diabetes     History of polysubstance abuse, cocaine, tobacco abuse, recent Covid 19 and MRSA pneumonia and sepsis.    Labs:  Sodium 139, potassium 4.1, chloride 105, CO2 29, BUN 17 creatinine 0.63, hemoglobin 7.4 and hematocrit 23.6, BNP 359, troponin VIII, lactic acid 1.6, iron 53, TIBC 269 and U IBC 216     Assessment:  General- she is awake and alert, lying on a gurney in the emergency room.  In no acute distress.  HEENT-pupils are equal, no JVD.  Cardiac-heart tones of regular rate and rhythm no murmur gallop appreciated.  Chest-breath sounds are diminished on the right base, no crackles or wheezing appreciated.  Abdomen-soft nondistended.  Musculoskeletal-no lower extremity edema noted.  Psych-she is pleasant and appropriate, makes eye contact.  Neurologic-speech is clear moves all extremities without difficulty.      Initial meeting with patient and her husband in the ED.  She feels that she is doing better at this time.   Discussed her ejection fraction of 30% and heart failure.  Also discussed the importance of low-sodium diet, not using salt at the table.  She states that they mostly eat at home listed foods like pork chops chicken with rice, not prepackaged instant packaged rice.  Also discussed the importance of maintaining less than 64 ounces of fluid intake in a 24-hour period.  And how it relates to the sodium.   Also discussed the importance of daily weights.  Went over the zone magnet and discussed what to report.   Was given heart  failure teaching booklet along with his own magnet and information about the heart failure clinic and an appointment.  We will continue to follow, they did not have any further questions at this time.   Tresa Endo RN [facility]

## 2020-12-29 DIAGNOSIS — F1910 Other psychoactive substance abuse, uncomplicated: Secondary | ICD-10-CM | POA: Diagnosis not present

## 2020-12-29 DIAGNOSIS — D649 Anemia, unspecified: Secondary | ICD-10-CM | POA: Diagnosis not present

## 2020-12-29 DIAGNOSIS — I509 Heart failure, unspecified: Secondary | ICD-10-CM | POA: Diagnosis not present

## 2020-12-29 LAB — CBC
HCT: 21 % — ABNORMAL LOW (ref 36.0–46.0)
Hemoglobin: 6.8 g/dL — ABNORMAL LOW (ref 12.0–15.0)
MCH: 28.8 pg (ref 26.0–34.0)
MCHC: 32.4 g/dL (ref 30.0–36.0)
MCV: 89 fL (ref 80.0–100.0)
Platelets: 358 10*3/uL (ref 150–400)
RBC: 2.36 MIL/uL — ABNORMAL LOW (ref 3.87–5.11)
RDW: 16.5 % — ABNORMAL HIGH (ref 11.5–15.5)
WBC: 7.8 10*3/uL (ref 4.0–10.5)
nRBC: 0 % (ref 0.0–0.2)

## 2020-12-29 LAB — GLUCOSE, CAPILLARY
Glucose-Capillary: 246 mg/dL — ABNORMAL HIGH (ref 70–99)
Glucose-Capillary: 222 mg/dL — ABNORMAL HIGH (ref 70–99)
Glucose-Capillary: 165 mg/dL — ABNORMAL HIGH (ref 70–99)
Glucose-Capillary: 108 mg/dL — ABNORMAL HIGH (ref 70–99)

## 2020-12-29 LAB — BASIC METABOLIC PANEL
Anion gap: 7 (ref 5–15)
BUN: 23 mg/dL — ABNORMAL HIGH (ref 6–20)
CO2: 30 mmol/L (ref 22–32)
Calcium: 8.2 mg/dL — ABNORMAL LOW (ref 8.9–10.3)
Chloride: 99 mmol/L (ref 98–111)
Creatinine, Ser: 0.43 mg/dL — ABNORMAL LOW (ref 0.44–1.00)
GFR, Estimated: >60 mL/min (ref >60)
Glucose, Bld: 144 mg/dL — ABNORMAL HIGH (ref 70–99)
Potassium: 3.6 mmol/L (ref 3.5–5.1)
Sodium: 136 mmol/L (ref 135–145)

## 2020-12-29 LAB — HEMOGLOBIN AND HEMATOCRIT, BLOOD
HCT: 26.2 % — ABNORMAL LOW (ref 36.0–46.0)
Hemoglobin: 8.6 g/dL — ABNORMAL LOW (ref 12.0–15.0)

## 2020-12-29 LAB — LEGIONELLA PNEUMOPHILA SEROGP 1 UR AG: L. pneumophila Serogp 1 Ur Ag: NEGATIVE

## 2020-12-29 LAB — ABO/RH: ABO/RH(D): AB POS

## 2020-12-29 LAB — OCCULT BLOOD X 1 CARD TO LAB, STOOL: Fecal Occult Bld: POSITIVE — AB

## 2020-12-29 LAB — PREPARE RBC (CROSSMATCH)

## 2020-12-29 MED ORDER — KETOROLAC TROMETHAMINE 15 MG/ML IJ SOLN
15.0000 mg | Freq: Four times a day (QID) | INTRAMUSCULAR | Status: DC
  Administered 2020-12-29 – 2020-12-31 (×7): 15 mg via INTRAVENOUS
  Filled 2020-12-29 (×8): qty 1

## 2020-12-29 MED ORDER — LISINOPRIL 5 MG PO TABS
2.5000 mg | ORAL_TABLET | Freq: Every day | ORAL | Status: DC
  Administered 2020-12-29: 2.5 mg via ORAL
  Filled 2020-12-29: qty 1

## 2020-12-29 MED ORDER — FENTANYL CITRATE (PF) 100 MCG/2ML IJ SOLN
12.5000 ug | INTRAMUSCULAR | Status: DC | PRN
  Administered 2020-12-29 (×2): 12.5 ug via INTRAVENOUS
  Filled 2020-12-29 (×2): qty 2

## 2020-12-29 MED ORDER — SODIUM CHLORIDE 0.9% IV SOLUTION: Freq: Once | INTRAVENOUS | Status: AC

## 2020-12-29 NOTE — Progress Notes (Signed)
[facility] Cardiology  SUBJECTIVE: Patient laying in bed, reports feeling better, less shortness of breath   Vitals:   12/28/20 1600 12/28/20 1733 12/28/20 2004 12/29/20 0312  BP: 124/77 130/88 111/70 124/75  Pulse: 94 100 (!) 102 87  Resp: 16  16 17   Temp:  98.7 F (37.1 C) 99.8 F (37.7 C) 98.7 F (37.1 C)  TempSrc:  Oral Oral Oral  SpO2: 95% 99% 95% 100%  Weight:  43.3 kg  43.7 kg  Height:  5' (1.524 m)       Intake/Output Summary (Last 24 hours) at 12/29/2020 0757 Last data filed at 12/29/2020 0315 Gross per 24 hour  Intake 350 ml  Output 1150 ml  Net -800 ml      PHYSICAL EXAM  General: Well developed, well nourished, in no acute distress HEENT:  Normocephalic and atramatic Neck:  No JVD.  Lungs: Clear bilaterally to auscultation and percussion. Heart: HRRR . Normal S1 and S2 without gallops or murmurs.  Abdomen: Bowel sounds are positive, abdomen soft and non-tender  Msk:  Back normal, normal gait. Normal strength and tone for age. Extremities: No clubbing, cyanosis or edema.   Neuro: Alert and oriented X 3. Psych:  Good affect, responds appropriately   LABS: Basic Metabolic Panel: Recent Labs    12/28/20 0421 12/29/20 0611  NA 141 136  K 4.0 3.6  CL 108 99  CO2 28 30  GLUCOSE 245* 144*  BUN 16 23*  CREATININE 0.59 0.43*  CALCIUM 8.0* 8.2*   Liver Function Tests: No results for input(s): AST, ALT, ALKPHOS, BILITOT, PROT, ALBUMIN in the last 72 hours. No results for input(s): LIPASE, AMYLASE in the last 72 hours. CBC: Recent Labs    12/28/20 0301 12/29/20 0611  WBC 7.9 7.8  HGB 7.4* 6.8*  HCT 23.6* 21.0*  MCV 90.8 89.0  PLT 363 358   Cardiac Enzymes: No results for input(s): CKTOTAL, CKMB, CKMBINDEX, TROPONINI in the last 72 hours. BNP: Invalid input(s): POCBNP D-Dimer: No results for input(s): DDIMER in the last 72 hours. Hemoglobin A1C: No results for input(s): HGBA1C in the last 72 hours. Fasting Lipid Panel: No results for input(s):  CHOL, HDL, LDLCALC, TRIG, CHOLHDL, LDLDIRECT in the last 72 hours. Thyroid Function Tests: No results for input(s): TSH, T4TOTAL, T3FREE, THYROIDAB in the last 72 hours.  Invalid input(s): FREET3 Anemia Panel: Recent Labs    12/28/20 0421 12/28/20 0622  VITAMINB12  --  463  FOLATE 9.7  --   TIBC 269  --   IRON 53  --   RETICCTPCT  --  2.0    DG Chest Portable 1 View  Result Date: 12/28/2020 CLINICAL DATA:  Shortness of breath. EXAM: PORTABLE CHEST 1 VIEW COMPARISON:  Chest x-ray 12/07/2020, CT chest 12/03/2020 FINDINGS: The heart size and mediastinal contours are within normal limits. Biapical pleural/pulmonary scarring. Patchy airspace opacity of the right lower lobe and inferior portion of the right upper lobe is again noted. Persistent coarsened and increased interstitial markings. Redemonstration of a trace to small volume right pleural effusion. Blunting of the left costophrenic angle likely representing a trace pleural effusion. No pneumothorax. No acute osseous abnormality. IMPRESSION: 1. Right lower lobe infection/inflammation with persistent trace to small volume right pleural effusion. 2. Trace left pleural effusion. 3. Persistent coarsened and increased interstitial markings. Likely combination of pulmonary edema and infection/inflammation. Electronically Signed   By: 12/05/2020 M.D.   On: 12/28/2020 03:42     Echo LVEF 30 to 35% by  2D echocardiogram 12/03/2020  TELEMETRY: Sinus rhythm:  ASSESSMENT AND PLAN:  Active Problems:   Acute CHF (congestive heart failure) (HCC)    1. Acute on chronic systolic CHF, with LVEF 30-35% per recent echocardiogram 12/03/2020, which is much lower compared to echo in 2020, which showed LVEF 60%; like due to polysubstance abuse. BNP >300.  Patient clinically improved after diuresis with IV furosemide with improved breathing, less pedal edema. 2. Right lobar pneumonia with recent history of MRSA pneumonia with parapneumonic effusion,  started on antibiotics 3. Anemia,  hemoglobin hematocrit 6.8 and 21.0, respectively (down from 9.5 and 29.1 respectively, 12/12/2020 two weeks ago),  4. Uncontrolled type I diabetes  Recommendations  1.  Agree with overall current therapy 2.  Continue diuresis 3.  Carefully monitor renal status 4.  Continue carvedilol, uptitrate as blood pressure permits 5.  Add ACEI/ARB as blood pressure permits 6.  Strongly advised patient to stop polysubstance abuse 7.  CHF clinic consult    Marcina Millard, MD, PhD, [facility] 12/29/2020 7:57 AM

## 2020-12-29 NOTE — Progress Notes (Signed)
Inpatient Diabetes Program Recommendations  AACE/ADA: New Consensus Statement on Inpatient Glycemic Control   Target Ranges:  Prepandial:   less than 140 mg/dL      Peak postprandial:   less than 180 mg/dL (1-2 hours)      Critically ill patients:  140 - 180 mg/dL   Results for [name], [name] (MRN [id number]) as of 12/29/2020 12:23  Ref. Range 12/28/2020 13:12 12/28/2020 17:33 12/28/2020 20:12 12/29/2020 09:10 12/29/2020 11:36  Glucose-Capillary Latest Ref Range: 70 - 99 mg/dL 409 (H) 811 (H) 914 (H) 246 (H) 222 (H)  Results for [name], [name] (MRN [id number]) as of 12/29/2020 12:23  Ref. Range 04/06/2020 19:48 11/30/2020 13:40  Hemoglobin A1C Latest Ref Range: 4.8 - 5.6 % 14.1 (H) 14.7 (H)   Review of Glycemic Control  Diabetes history:DM1 (makes NO insulin; requires basal, correction, and meal coverage insulin) Outpatient Diabetes medications:Lantus 35 units daily, Novolog 1-15 units TID with meals (1 unit per 15 grams of carbs) Current orders for Inpatient glycemic control:Lantus 30 units daily,Novolog 0-9 units TID with meals, Novolog 0-5 units QHS  Inpatient Diabetes Program Recommendations:    Insulin: Please consider ordering Novolog 3 units TID with meals for meal coverage if patient eats at least 50% of meals.  HbgA1C: A1C 14.7% on 11/30/20 indicating an average glucose of 375 mg/dl. Inpatient diabetes coordinator spoke with patient on 12/11/20 during prior admission.   Thanks, Orlando Penner, RN, MSN, CDE Diabetes Coordinator Inpatient Diabetes Program [phone] (Team Pager from 8am to 5pm)

## 2020-12-29 NOTE — Progress Notes (Signed)
Progress Note    [name]  IWL:[id number] DOB: 27-Nov-1985  DOA: 12/28/2020 PCP: [facility]    Brief Narrative:  Medical records reviewed and are as summarized below:  [name] is an 35 y.o. female with medical history significant for GERD, type I diabetes mellitus and polysubstance abuse as well as systolic CHF with cardiomyopathy thought to be related to cocaine, recent acute hypoxemic respiratory failure secondary to COVID-19 and superimposed bacterial pneumonia with MRSA sepsis for which she was admitted to [facility] last month and discharged 2 weeks ago, who presented to the emergency room with acute onset of worsening dyspnea over the last couple of days with increased work of breathing that was remarkable since this evening.  She admitted to 3-4 pillow orthopnea with paroxysmal nocturnal dyspnea as well as dyspnea on exertion.     Assessment/Plan:   Active Problems:   Acute CHF (congestive heart failure) (HCC)   acute on chronic systolic CHF with pulmonary edema. -Most recent 2D echo on 12/03/2020 revealed EF of 30 to 35%. -The patient will be admitted to a progressive unit bed. -diurese with IV Lasix. -continue her Coreg. --cards consult appreciated  Right lobar pneumonia with history of recent MRSA pneumonia with parapneumonic effusion. -appears patient did not take her abx at home completely -continue just IV vanc for now -no WBC/no fever   Uncontrolled type 1 diabetes mellitus with hyperglycemia and peripheral neuropathy. -SSI -lantus -monitor -Ac1 not controlled   Acute on chronic anemia. -? Chronic disease -denies recent menstruation -Fe ok -transfuse 1 unit and recheck -continue diuresis   GERD. - PPI therapy.  Drug abuse -avoid narcotics -given fentanyl upon admission- decrease dose -toradol for now   Family Communication/Anticipated D/C date and plan/Code Status   DVT prophylaxis: Lovenox ordered. Code Status: Full  Code.  Disposition Plan: Status is: Inpatient  Remains inpatient appropriate because:IV treatments appropriate due to intensity of illness or inability to take PO   Dispo: The patient is from: Home              Anticipated d/c is to: Home              Patient currently is not medically stable to d/c.   Difficult to place patient No     Medical Consultants:   cards   Subjective:   Last menstruation was Feb, denies dark stools  Objective:    Vitals:   12/29/20 0909 12/29/20 1120 12/29/20 1137 12/29/20 1145  BP: 113/70 117/69  (!) 98/56  Pulse: 99 88 95 89  Resp:  18  20  Temp: 98.8 F (37.1 C) 98 F (36.7 C)  98.2 F (36.8 C)  TempSrc: Oral Axillary  Axillary  SpO2: 95% 99% 97% 99%  Weight:      Height:        Intake/Output Summary (Last 24 hours) at 12/29/2020 1215 Last data filed at 12/29/2020 1120 Gross per 24 hour  Intake 410 ml  Output 1150 ml  Net -740 ml   Filed Weights   12/28/20 0258 12/28/20 1733 12/29/20 0312  Weight: 40 kg 43.3 kg 43.7 kg    Exam:  General: Appearance:    Thin female in no acute distress     Lungs:      respirations unlabored  Heart:    Normal heart rate. Normal rhythm.    MS:   All extremities are intact. No edema  Neurologic:   Awake, alert, oriented x 3. No apparent focal  neurological           defect.     Data Reviewed:   I have personally reviewed following labs and imaging studies:  Labs: Labs show the following:   Basic Metabolic Panel: Recent Labs  Lab 12/28/20 0301 12/28/20 0421 12/29/20 0611  NA 139 141 136  K 4.1 4.0 3.6  CL 105 108 99  CO2 29 28 30   GLUCOSE 278* 245* 144*  BUN 17 16 23*  CREATININE 0.63 0.59 0.43*  CALCIUM 8.2* 8.0* 8.2*   GFR Estimated Creatinine Clearance: 67.1 mL/min (A) (by C-G formula based on SCr of 0.43 mg/dL (L)). Liver Function Tests: No results for input(s): AST, ALT, ALKPHOS, BILITOT, PROT, ALBUMIN in the last 168 hours. No results for input(s): LIPASE, AMYLASE in  the last 168 hours. No results for input(s): AMMONIA in the last 168 hours. Coagulation profile No results for input(s): INR, PROTIME in the last 168 hours.  CBC: Recent Labs  Lab 12/28/20 0301 12/29/20 0611  WBC 7.9 7.8  HGB 7.4* 6.8*  HCT 23.6* 21.0*  MCV 90.8 89.0  PLT 363 358   Cardiac Enzymes: No results for input(s): CKTOTAL, CKMB, CKMBINDEX, TROPONINI in the last 168 hours. BNP (last 3 results) No results for input(s): PROBNP in the last 8760 hours. CBG: Recent Labs  Lab 12/28/20 1312 12/28/20 1733 12/28/20 2012 12/29/20 0910 12/29/20 1136  GLUCAP 206* 132* 187* 246* 222*   D-Dimer: No results for input(s): DDIMER in the last 72 hours. Hgb A1c: No results for input(s): HGBA1C in the last 72 hours. Lipid Profile: No results for input(s): CHOL, HDL, LDLCALC, TRIG, CHOLHDL, LDLDIRECT in the last 72 hours. Thyroid function studies: No results for input(s): TSH, T4TOTAL, T3FREE, THYROIDAB in the last 72 hours.  Invalid input(s): FREET3 Anemia work up: Recent Labs    12/28/20 0421 12/28/20 0622  VITAMINB12  --  463  FOLATE 9.7  --   TIBC 269  --   IRON 53  --   RETICCTPCT  --  2.0   Sepsis Labs: Recent Labs  Lab 12/28/20 0301 12/28/20 0421 12/28/20 0622 12/29/20 0611  WBC 7.9  --   --  7.8  LATICACIDVEN  --  1.6 1.5  --     Microbiology Recent Results (from the past 240 hour(s))  Blood culture (routine x 2)     Status: None (Preliminary result)   Collection Time: 12/28/20  4:21 AM   Specimen: BLOOD  Result Value Ref Range Status   Specimen Description BLOOD LEFT [facility]  Final   Special Requests   Final    BOTTLES DRAWN AEROBIC AND ANAEROBIC Blood Culture adequate volume   Culture   Final    NO GROWTH 1 DAY Performed at [facility], [address] [place], [place]    Report Status PENDING  Incomplete  Blood culture (routine x 2)     Status: None (Preliminary result)   Collection Time: 12/28/20  4:21 AM   Specimen: BLOOD   Result Value Ref Range Status   Specimen Description BLOOD LEFT HAND  Final   Special Requests   Final    BOTTLES DRAWN AEROBIC AND ANAEROBIC Blood Culture adequate volume   Culture   Final    NO GROWTH 1 DAY Performed at [facility], [address] [place], [place]    Report Status PENDING  Incomplete    Procedures and diagnostic studies:  DG Chest Portable 1 View  Result Date: 12/28/2020  CLINICAL DATA:  Shortness of breath. EXAM: PORTABLE CHEST 1 VIEW COMPARISON:  Chest x-ray 12/07/2020, CT chest 12/03/2020 FINDINGS: The heart size and mediastinal contours are within normal limits. Biapical pleural/pulmonary scarring. Patchy airspace opacity of the right lower lobe and inferior portion of the right upper lobe is again noted. Persistent coarsened and increased interstitial markings. Redemonstration of a trace to small volume right pleural effusion. Blunting of the left costophrenic angle likely representing a trace pleural effusion. No pneumothorax. No acute osseous abnormality. IMPRESSION: 1. Right lower lobe infection/inflammation with persistent trace to small volume right pleural effusion. 2. Trace left pleural effusion. 3. Persistent coarsened and increased interstitial markings. Likely combination of pulmonary edema and infection/inflammation. Electronically Signed   By: Tish Frederickson M.D.   On: 12/28/2020 03:42    Medications:   . carvedilol  3.125 mg Oral BID WC  . enoxaparin (LOVENOX) injection  30 mg Subcutaneous Q24H  . furosemide  40 mg Intravenous Q12H  . insulin aspart  0-5 Units Subcutaneous QHS  . insulin aspart  0-9 Units Subcutaneous TID WC  . insulin glargine  30 Units Subcutaneous Q2200  . ketorolac  15 mg Intravenous Q6H  . lisinopril  2.5 mg Oral Daily  . pantoprazole  40 mg Oral Daily   Continuous Infusions: . vancomycin 500 mg (12/29/20 0627)     LOS: 1 day   Joseph Art  [facility]   How to contact the [facility]  Attending or Consulting provider 7A - 7P or covering provider during after hours 7P -7A, for this patient?  1. Check the care team in [facility] and look for a) attending/consulting TR[facility] provider listed and b) the [facility] team listed 2. Log into [email or web address] and use [facility] universal password to access. If you do not have the password, please contact the hospital operator. 3. Locate the [facility] provider you are looking for under [facility] and page to a number that you can be directly reached. 4. If you still have difficulty reaching the provider, please page the [facility] (Director on Call) for the Hospitalists listed on amion for assistance.  12/29/2020, 12:15 PM

## 2020-12-29 NOTE — Progress Notes (Signed)
   Heart Failure Nurse Navigator Note  HFr EF 30 to 35%.  Wall motion abnormalities noted.  Right ventricular systolic function moderately reduced.  Mild to moderate mitral regurgitation   She presented to the emergency room with complaints of dyspnea over the last few days worsening last evening.  Also noted 3-4 pillow orthopnea with PND, dyspnea on exertion and wheezing and a dry cough.  Comorbidities:  Diabetes  She also has a history of polysubstance abuse-cocaine, tobacco abuse, recent COVID-19 and MRSA pneumonia with sepsis.  Labs:  Sodium 136, potassium 3.6, chloride 99, CO2 30, BUN 23, creatinine 0.43, WBC 7.8, hemoglobin 6.8 down from 7.4 of yesterday and 9.52 weeks ago, hematocrit 21, platelet count 358. Intake 350 mL Output 1150 weight is 43.7 BMI is 18.89 blood pressure 111/80  Temperature 100.7  Met with patient today, her husband was not present but oldest daughter was.  Daughter was asking appropriate questions about hemoglobin levels, so wanted to learn more about heart failure with reduced ejection fraction.  Explained heart failure to her and explained how her mom's going  have to take care of herself from here on out with daily weights, low-sodium and watching her fluid intake.  She voices understanding.  Patient stated that she felt very warm nurse checked her temperature.  Instructed and will continue to follow along as she is an inpatient.   Pricilla Riffle RN CHFN

## 2020-12-30 DIAGNOSIS — I5021 Acute systolic (congestive) heart failure: Secondary | ICD-10-CM

## 2020-12-30 DIAGNOSIS — D649 Anemia, unspecified: Secondary | ICD-10-CM | POA: Diagnosis not present

## 2020-12-30 LAB — CBC
HCT: 28.5 % — ABNORMAL LOW (ref 36.0–46.0)
Hemoglobin: 9.5 g/dL — ABNORMAL LOW (ref 12.0–15.0)
MCH: 29.3 pg (ref 26.0–34.0)
MCHC: 33.3 g/dL (ref 30.0–36.0)
MCV: 88 fL (ref 80.0–100.0)
Platelets: 371 10*3/uL (ref 150–400)
RBC: 3.24 MIL/uL — ABNORMAL LOW (ref 3.87–5.11)
RDW: 15.3 % (ref 11.5–15.5)
WBC: 9.2 10*3/uL (ref 4.0–10.5)
nRBC: 0 % (ref 0.0–0.2)

## 2020-12-30 LAB — TYPE AND SCREEN
ABO/RH(D): AB POS
Antibody Screen: NEGATIVE
Unit division: 0

## 2020-12-30 LAB — BASIC METABOLIC PANEL
Anion gap: 7 (ref 5–15)
BUN: 25 mg/dL — ABNORMAL HIGH (ref 6–20)
CO2: 32 mmol/L (ref 22–32)
Calcium: 8.4 mg/dL — ABNORMAL LOW (ref 8.9–10.3)
Chloride: 94 mmol/L — ABNORMAL LOW (ref 98–111)
Creatinine, Ser: 0.63 mg/dL (ref 0.44–1.00)
GFR, Estimated: >60 mL/min (ref >60)
Glucose, Bld: 423 mg/dL — ABNORMAL HIGH (ref 70–99)
Potassium: 4.2 mmol/L (ref 3.5–5.1)
Sodium: 133 mmol/L — ABNORMAL LOW (ref 135–145)

## 2020-12-30 LAB — BPAM RBC
Blood Product Expiration Date: 202204132359
ISSUE DATE / TIME: 202203151115
Unit Type and Rh: 6200

## 2020-12-30 LAB — GLUCOSE, CAPILLARY
Glucose-Capillary: 551 mg/dL (ref 70–99)
Glucose-Capillary: 542 mg/dL (ref 70–99)
Glucose-Capillary: 63 mg/dL — ABNORMAL LOW (ref 70–99)
Glucose-Capillary: 89 mg/dL (ref 70–99)
Glucose-Capillary: 68 mg/dL — ABNORMAL LOW (ref 70–99)
Glucose-Capillary: 163 mg/dL — ABNORMAL HIGH (ref 70–99)

## 2020-12-30 LAB — MRSA PCR SCREENING: MRSA by PCR: NEGATIVE

## 2020-12-30 LAB — PROCALCITONIN: Procalcitonin: <0.1 ng/mL

## 2020-12-30 MED ORDER — CARVEDILOL 6.25 MG PO TABS
6.2500 mg | ORAL_TABLET | Freq: Two times a day (BID) | ORAL | Status: DC
  Administered 2020-12-30 – 2020-12-31 (×2): 6.25 mg via ORAL
  Filled 2020-12-30 (×2): qty 1

## 2020-12-30 MED ORDER — LISINOPRIL 5 MG PO TABS
5.0000 mg | ORAL_TABLET | Freq: Every day | ORAL | Status: DC
  Administered 2020-12-30 – 2020-12-31 (×2): 5 mg via ORAL
  Filled 2020-12-30 (×2): qty 1

## 2020-12-30 MED ORDER — INSULIN DETEMIR 100 UNIT/ML ~~LOC~~ SOLN
15.0000 [IU] | Freq: Once | SUBCUTANEOUS | Status: DC
  Filled 2020-12-30: qty 0.15

## 2020-12-30 MED ORDER — INSULIN GLARGINE 100 UNIT/ML ~~LOC~~ SOLN
30.0000 [IU] | Freq: Every day | SUBCUTANEOUS | Status: DC
  Administered 2020-12-30: 30 [IU] via SUBCUTANEOUS
  Filled 2020-12-30 (×2): qty 0.3

## 2020-12-30 MED ORDER — INSULIN ASPART 100 UNIT/ML ~~LOC~~ SOLN
11.0000 [IU] | Freq: Once | SUBCUTANEOUS | Status: AC
  Administered 2020-12-30: 11 [IU] via SUBCUTANEOUS
  Filled 2020-12-30: qty 1

## 2020-12-30 NOTE — Progress Notes (Signed)
[facility] Cardiology  SUBJECTIVE: Patient laying in bed, reports doing well, denies chest pain, shortness of breath, pedal edema   Vitals:   12/29/20 1958 12/29/20 2348 12/30/20 0406 12/30/20 0628  BP: 106/70 114/73 127/86 120/70  Pulse: 79 78 76 70  Resp:  15 16   Temp:  98.4 F (36.9 C) 98.4 F (36.9 C)   TempSrc:  Oral Oral   SpO2:  93% 99%   Weight:   43.5 kg   Height:         Intake/Output Summary (Last 24 hours) at 12/30/2020 0804 Last data filed at 12/30/2020 0308 Gross per 24 hour  Intake 3763 ml  Output --  Net 3763 ml      PHYSICAL EXAM  General: Well developed, well nourished, in no acute distress HEENT:  Normocephalic and atramatic Neck:  No JVD.  Lungs: Clear bilaterally to auscultation and percussion. Heart: HRRR . Normal S1 and S2 without gallops or murmurs.  Abdomen: Bowel sounds are positive, abdomen soft and non-tender  Msk:  Back normal, normal gait. Normal strength and tone for age. Extremities: No clubbing, cyanosis or edema.   Neuro: Alert and oriented X 3. Psych:  Good affect, responds appropriately   LABS: Basic Metabolic Panel: Recent Labs    12/29/20 0611 12/30/20 0722  NA 136 133*  K 3.6 4.2  CL 99 94*  CO2 30 32  GLUCOSE 144* 423*  BUN 23* 25*  CREATININE 0.43* 0.63  CALCIUM 8.2* 8.4*   Liver Function Tests: No results for input(s): AST, ALT, ALKPHOS, BILITOT, PROT, ALBUMIN in the last 72 hours. No results for input(s): LIPASE, AMYLASE in the last 72 hours. CBC: Recent Labs    12/28/20 0301 12/29/20 0611 12/29/20 1921  WBC 7.9 7.8  --   HGB 7.4* 6.8* 8.6*  HCT 23.6* 21.0* 26.2*  MCV 90.8 89.0  --   PLT 363 358  --    Cardiac Enzymes: No results for input(s): CKTOTAL, CKMB, CKMBINDEX, TROPONINI in the last 72 hours. BNP: Invalid input(s): POCBNP D-Dimer: No results for input(s): DDIMER in the last 72 hours. Hemoglobin A1C: No results for input(s): HGBA1C in the last 72 hours. Fasting Lipid Panel: No results for  input(s): CHOL, HDL, LDLCALC, TRIG, CHOLHDL, LDLDIRECT in the last 72 hours. Thyroid Function Tests: No results for input(s): TSH, T4TOTAL, T3FREE, THYROIDAB in the last 72 hours.  Invalid input(s): FREET3 Anemia Panel: Recent Labs    12/28/20 0421 12/28/20 0622  VITAMINB12  --  463  FOLATE 9.7  --   TIBC 269  --   IRON 53  --   RETICCTPCT  --  2.0    No results found.   Echo LVEF 30 to 35% by 2D echocardiogram 12/03/2020  TELEMETRY: Sinus rhythm:  ASSESSMENT AND PLAN:  Active Problems:   Acute CHF (congestive heart failure) (HCC)    1. Acute on chronic systolic CHF,with LVEF 30-35% per recent echocardiogram 12/03/2020, which is much lower compared to echo in 2020, which showed LVEF 60%; likely due to polysubstance abuse. BNP >300.  Patient clinically improved after diuresis with IV furosemide with improved breathing, less pedal edema. 2.Right lobar pneumonia with recent history of MRSA pneumonia with parapneumonic effusion, currently on IV vancomycin, appears clinically stable, no fever or cough. 3. Anemia, Hb/Hct 8.6 and 26.2, respectively, appears stable 4. Uncontrolled type I diabetes  Recommendations  1.  Agree with overall current therapy 2.  Continue diuresis 3.  Carefully monitor renal status 4.  Uptitrate carvedilol  6.25 mg twice daily 5.  Uptitrate lisinopril 5 mg daily 6.  Strongly advised patient to stop polysubstance abuse 7.  CHF clinic consult 8.  Follow-up 1 to 2 weeks following discharge    Marcina Millard, MD, PhD, [facility] 12/30/2020 8:04 AM

## 2020-12-30 NOTE — Progress Notes (Signed)
Mobility Specialist - Progress Note   12/30/20 1500  Mobility  Activity Contraindicated/medical hold  Mobility performed by Mobility specialist    Per chart review, pt with most recent glucose level of 542. Pt not appropriate for exertional activity at this time. Will monitor and attempt session another date/time.    Filiberto Pinks Mobility Specialist 12/30/20, 3:08 PM

## 2020-12-30 NOTE — Progress Notes (Signed)
Progress Note    [name]  STM:[id number] DOB: [birth year]  DOA: 12/28/2020 PCP: [facility]    Brief Narrative:  Medical records reviewed and are as summarized below:  [name] is an 35 y.o. female with medical history significant for GERD, type I diabetes mellitus and polysubstance abuse as well as systolic CHF with cardiomyopathy thought to be related to cocaine, recent acute hypoxemic respiratory failure secondary to COVID-19 and superimposed bacterial pneumonia with MRSA sepsis for which she was admitted to [facility] last month and discharged 2 weeks ago, who presented to the emergency room with acute onset of worsening dyspnea over the last couple of days with increased work of breathing that was remarkable since this evening.  She admitted to 3-4 pillow orthopnea with paroxysmal nocturnal dyspnea as well as dyspnea on exertion.     Assessment/Plan:   Active Problems:   Acute CHF (congestive heart failure) (HCC)   acute on chronic systolic CHF with pulmonary edema. -presented with 3-4 pillow orthopnea with paroxysmal nocturnal dyspnea as well as dyspnea on exertion.  --BNP 359  Most recent 2D echo on 12/03/2020 revealed EF of 30 to 35%. --cont IV lasix --Strict I/O --increase coreg to 6.25 BID, per cards --increase Lisinopril to 5 mg daily, per cards --Follow-up 1 to 2 weeks following discharge at cardiology clinic  history of recent MRSA pneumonia with parapneumonic effusion. -Left AMA and was discharged on Linezolid x2 weeks.  Unclear compliance. --on presentation, no fever, no leukocytosis.  --was started on IV vanc on presentation. --MRSA screen neg this time. Plan: --obtain procal this morning, neg --d/c IV vanc --No need for further abx    Uncontrolled type 1 diabetes mellitus with hyperglycemia and peripheral neuropathy. --cont Lantus 30u daily --SSI --do not hold Lantus  Acute on chronic anemia Anemia of chronic disease --anemia  workup neg, not def in iron, folate or vit B12 --s/p 1u pRBC --Monitor Hgb and transfuse to keep Hgb >7   GERD. - PPI therapy.  Drug abuse -avoid narcotics -d/c fentanyl    Family Communication/Anticipated D/C date and plan/Code Status   DVT prophylaxis: Lovenox ordered. Code Status: Full Code.  Disposition Plan: Status is: Inpatient  Remains inpatient appropriate because:IV treatments appropriate due to intensity of illness or inability to take PO   Dispo: The patient is from: Home              Anticipated d/c is to: Home              Patient currently is not medically stable to d/c. On IV lasix.    Difficult to place patient No    Medical Consultants:   cards   Subjective:   Lantus held last night, BG >500's this morning.  Pt denied dyspnea.  No swelling.  Good urine output.  Good oral intake.     Objective:    Vitals:   12/30/20 0406 12/30/20 0628 12/30/20 1144 12/30/20 1442  BP: 127/86 120/70 128/86 124/81  Pulse: 76 70 78 85  Resp: 16  20 18   Temp: 98.4 F (36.9 C)  98 F (36.7 C) 98.1 F (36.7 C)  TempSrc: Oral  Oral Oral  SpO2: 99%  95% 97%  Weight: 43.5 kg     Height:        Intake/Output Summary (Last 24 hours) at 12/30/2020 1452 Last data filed at 12/30/2020 1439 Gross per 24 hour  Intake 3233 ml  Output 750 ml  Net 2483 ml  Filed Weights   12/28/20 1733 12/29/20 0312 12/30/20 0406  Weight: 43.3 kg 43.7 kg 43.5 kg    Exam: Constitutional: NAD, AAOx3 HEENT: conjunctivae and lids normal, EOMI CV: RRR, no murmur, No cyanosis.   RESP: normal respiratory effort, reduced breath sounds, on RA Extremities: No effusions, edema in BLE SKIN: warm, dry Neuro: II - XII grossly intact.   Psych: Normal mood and affect.  Appropriate judgement and reason   Data Reviewed:   I have personally reviewed following labs and imaging studies:  Labs: Labs show the following:   Basic Metabolic Panel: Recent Labs  Lab 12/28/20 0301  12/28/20 0421 12/29/20 0611 12/30/20 0722  NA 139 141 136 133*  K 4.1 4.0 3.6 4.2  CL 105 108 99 94*  CO2 29 28 30  32  GLUCOSE 278* 245* 144* 423*  BUN 17 16 23* 25*  CREATININE 0.63 0.59 0.43* 0.63  CALCIUM 8.2* 8.0* 8.2* 8.4*   GFR Estimated Creatinine Clearance: 66.8 mL/min (by C-G formula based on SCr of 0.63 mg/dL). Liver Function Tests: No results for input(s): AST, ALT, ALKPHOS, BILITOT, PROT, ALBUMIN in the last 168 hours. No results for input(s): LIPASE, AMYLASE in the last 168 hours. No results for input(s): AMMONIA in the last 168 hours. Coagulation profile No results for input(s): INR, PROTIME in the last 168 hours.  CBC: Recent Labs  Lab 12/28/20 0301 12/29/20 0611 12/29/20 1921 12/30/20 0722  WBC 7.9 7.8  --  9.2  HGB 7.4* 6.8* 8.6* 9.5*  HCT 23.6* 21.0* 26.2* 28.5*  MCV 90.8 89.0  --  88.0  PLT 363 358  --  371   Cardiac Enzymes: No results for input(s): CKTOTAL, CKMB, CKMBINDEX, TROPONINI in the last 168 hours. BNP (last 3 results) No results for input(s): PROBNP in the last 8760 hours. CBG: Recent Labs  Lab 12/29/20 1136 12/29/20 1721 12/29/20 2048 12/30/20 0905 12/30/20 1145  GLUCAP 222* 165* 108* 551* 542*   D-Dimer: No results for input(s): DDIMER in the last 72 hours. Hgb A1c: No results for input(s): HGBA1C in the last 72 hours. Lipid Profile: No results for input(s): CHOL, HDL, LDLCALC, TRIG, CHOLHDL, LDLDIRECT in the last 72 hours. Thyroid function studies: No results for input(s): TSH, T4TOTAL, T3FREE, THYROIDAB in the last 72 hours.  Invalid input(s): FREET3 Anemia work up: Recent Labs    12/28/20 0421 12/28/20 0622  VITAMINB12  --  463  FOLATE 9.7  --   TIBC 269  --   IRON 53  --   RETICCTPCT  --  2.0   Sepsis Labs: Recent Labs  Lab 12/28/20 0301 12/28/20 0421 12/28/20 0622 12/29/20 0611 12/30/20 0722  PROCALCITON  --   --   --   --  <0.10  WBC 7.9  --   --  7.8 9.2  LATICACIDVEN  --  1.6 1.5  --   --      Microbiology Recent Results (from the past 240 hour(s))  Blood culture (routine x 2)     Status: None (Preliminary result)   Collection Time: 12/28/20  4:21 AM   Specimen: BLOOD  Result Value Ref Range Status   Specimen Description BLOOD LEFT [facility]  Final   Special Requests   Final    BOTTLES DRAWN AEROBIC AND ANAEROBIC Blood Culture adequate volume   Culture   Final    NO GROWTH 2 DAYS Performed at [facility], [address], [place], [place]    Report Status PENDING  Incomplete  Blood culture (routine x 2)     Status: None (Preliminary result)   Collection Time: 12/28/20  4:21 AM   Specimen: BLOOD  Result Value Ref Range Status   Specimen Description BLOOD LEFT HAND  Final   Special Requests   Final    BOTTLES DRAWN AEROBIC AND ANAEROBIC Blood Culture adequate volume   Culture   Final    NO GROWTH 2 DAYS Performed at [facility], [address], [place], [place] [address]    Report Status PENDING  Incomplete  MRSA PCR Screening     Status: None   Collection Time: 12/30/20 10:21 AM   Specimen: Nasal Mucosa; Nasopharyngeal  Result Value Ref Range Status   MRSA by PCR NEGATIVE NEGATIVE Final    Comment:        The GeneXpert MRSA Assay (FDA approved for NASAL specimens only), is one component of a comprehensive MRSA colonization surveillance program. It is not intended to diagnose MRSA infection nor to guide or monitor treatment for MRSA infections. Performed at [facility], [address] [place], [place] [address]     Procedures and diagnostic studies:  No results found.  Medications:   . carvedilol  6.25 mg Oral BID WC  . enoxaparin (LOVENOX) injection  30 mg Subcutaneous Q24H  . furosemide  40 mg Intravenous Q12H  . insulin aspart  0-5 Units Subcutaneous QHS  . insulin aspart  0-9 Units Subcutaneous TID WC  . insulin glargine  30 Units Subcutaneous Daily  . ketorolac  15 mg Intravenous Q6H  . lisinopril  5 mg  Oral Daily  . pantoprazole  40 mg Oral Daily   Continuous Infusions: . vancomycin 500 mg (12/30/20 0618)     LOS: 2 days   Darlin Priestly  [facility]

## 2020-12-30 NOTE — Progress Notes (Signed)
   Heart Failure Nurse Navigator Note  HFrEF 30 to 35%.  Wall motion abnormalities noted.  Right ventricular systolic function is moderately reduced.  Mild to moderate mitral regurgitation.   She presented to the emergency room with complaints of dyspnea over the last few days worsening last evening.  Also noted 3-4 pillow orthopnea with PND.  Dyspnea on on exertion, using and a dry cough.  Comorbidities  Diabetes  She also has a history of polysubstance abuse-cocaine, tobacco abuse, recent COVID-19 and MRSA pneumonia with sepsis.  Labs:  Sodium 133, potassium 4.2, chloride 94, CO2 32, BUN 25, creatinine 0.63, hemoglobin 9.5, hematocrit 28.5, Input 3763 Output not document Weight is 43.5 BMI 18.7 Blood pressure 124/81  Medications:  Carvedilol 6.25 mg 2 times a day with meals Furosemide 40 mg IV every 12 Lisinopril 5 mg daily   Assessment:  General she is awake and alert lying in bed, in no acute distress.  HEENT-pupils are equal, normocephalic no JVD.  Cardiac-heart tones of regular rate and rhythm no murmurs rubs appreciated  Chest-breath sounds are clear to posterior auscultation  Abdomen soft nontender.  Lower extremity pedal pulses are palpable, no lower extremity edema.  Psych-a little tearful saying that she wants to go home.  But is pleasant, and makes good eye contact.  Neurologic-speech is clear was all extremities without difficulty.   Spent time today talking with the patient about the importance of taking care of herself with a diet and fluid restriction, also stressed the importance of continuing with the medications that the doctors have placed her on.  Discussed that substance abuse is a toxin to her heart, she states that she has not used anything since before the Christmas holidays.  And plans to continue not to use as she has a 37 year old and a 38 year old at home.  Also talked about her going home too early, stressed the importance of her  staying and getting the appropriate antibiotic dosing that she needs.  She voices understanding.   Tresa Endo RN CHFN

## 2020-12-30 NOTE — Progress Notes (Signed)
Inpatient Diabetes Program Recommendations  AACE/ADA: New Consensus Statement on Inpatient Glycemic Control   Target Ranges:  Prepandial:   less than 140 mg/dL      Peak postprandial:   less than 180 mg/dL (1-2 hours)      Critically ill patients:  140 - 180 mg/dL   Results for [name], [name] (MRN [id number]) as of 12/30/2020 09:11  Ref. Range 12/29/2020 09:10 12/29/2020 11:36 12/29/2020 17:21 12/29/2020 20:48 12/30/2020 09:05  Glucose-Capillary Latest Ref Range: 70 - 99 mg/dL 888 (H) 280 (H) 034 (H) 108 (H) 551 (HH)  Results for [name], [name] (MRN [id number]) as of 12/30/2020 09:11  Ref. Range 12/30/2020 07:22  CO2 Latest Ref Range: 22 - 32 mmol/L 32  Glucose Latest Ref Range: 70 - 99 mg/dL 979 (H)  Anion gap Latest Ref Range: 5 - 15  7   Review of Glycemic Control  Diabetes history:DM1 (makes NO insulin; requires basal, correction, and meal coverage insulin) Outpatient Diabetes medications:Lantus 35 units daily, Novolog 1-15 units TID with meals (1 unit per 15 grams of carbs) Current orders for Inpatient glycemic control:Levemir 15 units x1, Lantus30units QHS,Novolog 0-9units TID with meals, Novolog 0-5 units QHS  Inpatient Diabetes Program Recommendations:    Insulin -Basal: In reviewing chart, noted patient did NOT receive any Lantus last night (chart as NOT GIVEN; pt reports PO intake and okay to hold per Jon Billings, NP). As a result of not getting basal insulin, lab glucose 423 mg/dl at 4:80 and finger stick glucose 551 mg/dl at 1:65 am today. Please discontinue Levemir order and change frequency of Lantus to 30 units daily to start now.   Insulin-Meal Coverage: Please consider ordering Novolog 2 units TID with meals for meal coverage if patient eats at least 50% of meals.  HbgA1C: A1C 14.7% on 11/30/20 indicating an average glucose of 375 mg/dl. Inpatient diabetes coordinator spoke with patient on 12/11/20 during prior admission.   Thanks, Orlando Penner, RN, MSN,  CDE Diabetes Coordinator Inpatient Diabetes Program [phone] (Team Pager from 8am to 5pm)

## 2020-12-30 NOTE — Progress Notes (Signed)
Pt transferred to 102 report called.

## 2020-12-31 LAB — CBC
HCT: 28.8 % — ABNORMAL LOW (ref 36.0–46.0)
Hemoglobin: 9.9 g/dL — ABNORMAL LOW (ref 12.0–15.0)
MCH: 29.3 pg (ref 26.0–34.0)
MCHC: 34.4 g/dL (ref 30.0–36.0)
MCV: 85.2 fL (ref 80.0–100.0)
Platelets: 472 10*3/uL — ABNORMAL HIGH (ref 150–400)
RBC: 3.38 MIL/uL — ABNORMAL LOW (ref 3.87–5.11)
RDW: 14.9 % (ref 11.5–15.5)
WBC: 10.3 10*3/uL (ref 4.0–10.5)
nRBC: 0 % (ref 0.0–0.2)

## 2020-12-31 LAB — GLUCOSE, CAPILLARY: Glucose-Capillary: 225 mg/dL — ABNORMAL HIGH (ref 70–99)

## 2020-12-31 LAB — BASIC METABOLIC PANEL
Anion gap: 9 (ref 5–15)
BUN: 31 mg/dL — ABNORMAL HIGH (ref 6–20)
CO2: 33 mmol/L — ABNORMAL HIGH (ref 22–32)
Calcium: 8.6 mg/dL — ABNORMAL LOW (ref 8.9–10.3)
Chloride: 94 mmol/L — ABNORMAL LOW (ref 98–111)
Creatinine, Ser: 0.53 mg/dL (ref 0.44–1.00)
GFR, Estimated: >60 mL/min (ref >60)
Glucose, Bld: 168 mg/dL — ABNORMAL HIGH (ref 70–99)
Potassium: 3.6 mmol/L (ref 3.5–5.1)
Sodium: 136 mmol/L (ref 135–145)

## 2020-12-31 LAB — MAGNESIUM: Magnesium: 2 mg/dL (ref 1.7–2.4)

## 2020-12-31 MED ORDER — FUROSEMIDE 20 MG PO TABS: 40.0000 mg | ORAL_TABLET | Freq: Every day | ORAL | 2 refills | Status: DC

## 2020-12-31 MED ORDER — LISINOPRIL 5 MG PO TABS: 5.0000 mg | ORAL_TABLET | Freq: Every day | ORAL | 2 refills | Status: DC

## 2020-12-31 MED ORDER — FUROSEMIDE 40 MG PO TABS: 40.0000 mg | ORAL_TABLET | Freq: Every day | ORAL | Status: DC

## 2020-12-31 MED ORDER — CARVEDILOL 6.25 MG PO TABS: 6.2500 mg | ORAL_TABLET | Freq: Two times a day (BID) | ORAL | 2 refills | Status: AC

## 2020-12-31 NOTE — Plan of Care (Signed)
Pt reports no shortness of breath. Tolerating bed a 30 degrees. Scheduled toradol controlling pain. Lasix given at 1953 and pt had 700cc of output  Problem: Education: Goal: Knowledge of General Education information will improve Description: Including pain rating scale, medication(s)/side effects and non-pharmacologic comfort measures Outcome: Progressing   Problem: Health Behavior/Discharge Planning: Goal: Ability to manage health-related needs will improve Outcome: Progressing   Problem: Clinical Measurements: Goal: Ability to maintain clinical measurements within normal limits will improve Outcome: Progressing Goal: Will remain free from infection Outcome: Progressing Goal: Diagnostic test results will improve Outcome: Progressing Goal: Respiratory complications will improve Outcome: Progressing Goal: Cardiovascular complication will be avoided Outcome: Progressing   Problem: Activity: Goal: Risk for activity intolerance will decrease Outcome: Progressing   Problem: Nutrition: Goal: Adequate nutrition will be maintained Outcome: Progressing   Problem: Coping: Goal: Level of anxiety will decrease Outcome: Progressing   Problem: Elimination: Goal: Will not experience complications related to bowel motility Outcome: Progressing Goal: Will not experience complications related to urinary retention Outcome: Progressing   Problem: Pain Managment: Goal: General experience of comfort will improve Outcome: Progressing   Problem: Safety: Goal: Ability to remain free from injury will improve Outcome: Progressing   Problem: Skin Integrity: Goal: Risk for impaired skin integrity will decrease Outcome: Progressing

## 2020-12-31 NOTE — Progress Notes (Signed)
Patient discharged from [facility] via [facility] to care of family. All personal belongings with patient at this time. Discharge instructions/prescriptions reviewed with patient and she verbalized understanding. No further concerns noted.

## 2020-12-31 NOTE — Progress Notes (Signed)
Inpatient Diabetes Program Recommendations  AACE/ADA: New Consensus Statement on Inpatient Glycemic Control   Target Ranges:  Prepandial:   less than 140 mg/dL      Peak postprandial:   less than 180 mg/dL (1-2 hours)      Critically ill patients:  140 - 180 mg/dL  Results for [name], [name] (MRN [id number]) as of 12/31/2020 07:30  Ref. Range 12/31/2020 04:21  Glucose Latest Ref Range: 70 - 99 mg/dL 811 (H)   Results for [name], [name] (MRN [id number]) as of 12/31/2020 07:30  Ref. Range 12/30/2020 09:05 12/30/2020 11:45 12/30/2020 16:06 12/30/2020 16:22 12/30/2020 16:43 12/30/2020 19:55  Glucose-Capillary Latest Ref Range: 70 - 99 mg/dL 681 (HH)  Novolog 9 units@10 :19  Lantus 30 units@10 :18 542 (HH)  Novolog 20 units@13 :11 89 63 (L) 68 (L) 163 (H)   Review of Glycemic Control  Diabetes history:DM1 (makes NO insulin; requires basal, correction, and meal coverage insulin) Outpatient Diabetes medications:Lantus 35 units daily, Novolog 1-15 units TID with meals (1 unit per 15 grams of carbs) Current orders for Inpatient glycemic control:  Lantus30units daily,Novolog 0-9units TID with meals, Novolog 0-5 units QHS  Inpatient Diabetes Program Recommendations:    Insulin-Meal Coverage:Please consider ordering Novolog3units TID with meals for meal coverage if patient eats at least 50% of meals.  NOTE: In reviewing glucose trends, noted patient received Novolog 20 units at 13:11 on 12/30/20 for glucose obtained at 11:45. Then glucose down to 63 mg/dl at 59:47 on 0/76/15 (likely from high dose of Novolog, as patient has Type 1 DM and is sensitive to insulin). Fasting glucose 168 mg/dl on labs today. Would not recommend making any changes with basal insulin at this time but would recommend adding meal coverage insulin.   Thanks, Orlando Penner, RN, MSN, CDE Diabetes Coordinator Inpatient Diabetes Program [phone] (Team Pager from 8am to 5pm)

## 2020-12-31 NOTE — Discharge Summary (Signed)
Physician Discharge Summary   [name]  female DOB: [birth year]  ZOX:[id number]  PCP: [facility]  Admit date: 12/28/2020 Discharge date: 12/31/2020  Admitted From: home Disposition:  home CODE STATUS: Full code  Discharge Instructions    Discharge instructions   Complete by: As directed    Cardiologist Dr. Darrold Junker increased your Coreg and Lasix, and added Lisinopril.  I have send prescriptions for all 3 to your pharmacy.  Please take them as directed.   Dr. Darlin Priestly - -      30 Day Unplanned Readmission Risk Score   Flowsheet Row ED to Hosp-Admission (Current) from 12/28/2020 in [facility] (1C)  30 Day Unplanned Readmission Risk Score (%) 24.81 Filed at 12/31/2020 0801     This score is the patient's risk of an unplanned readmission within 30 days of being discharged (0 -100%). The score is based on dignosis, age, lab data, medications, orders, and past utilization.   Low:  0-14.9   Medium: 15-21.9   High: 22-29.9   Extreme: 30 and above         Hospital Course:  For full details, please see H&P, progress notes, consult notes and ancillary notes.  Briefly,  [name] is an 35 y.o. 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  acute on chronic systolic CHF with pulmonary edema. presented with 3-4 pillow orthopnea with paroxysmal nocturnal dyspnea as well as dyspnea on exertion.  BNP 359.  Most recent 2D echo on 12/03/2020 revealed EF of 30 to 35%.  Cariology  was consulted.  Pt received diuresis with IV lasix.  Cardiologist Dr. Darrold Junker increased Coreg to 6.25 mg BID, home Lasix to 40 mg daily, and added Lisinopril 5 mg daily.   Follow-up 1 to 2 weeks following discharge at cardiology clinic  history of recent MRSA pneumonia with parapneumonic effusion. -Left AMA from last hospitalization and was discharged on Linezolid x2 weeks.  Unclear compliance at home. --on presentation, no fever, no leukocytosis.  --was started on IV vanc on presentation. --MRSA screen neg this time.  procal neg.  IV Vanc d/c'ed, and no further abx needed at this time.  Uncontrolled type 1diabetes mellitus with hyperglycemia and peripheral neuropathy. Pt was discharged on home insulin regimen.    Acute on chronic anemia Anemia of chronic disease --anemia workup neg, not def in iron, folate or vit B12 --s/p 1u pRBC  GERD. - PPI therapy.  Drug abuse -avoid narcotics   Discharge Diagnoses:  Active Problems:   Acute CHF (congestive heart failure) [facility])    Discharge Instructions:  Allergies as of 12/31/2020      Reactions   Codeine Anaphylaxis, Nausea And Vomiting   Penicillins Rash   As a teenager   Tramadol Itching      Medication List    STOP taking these medications   gabapentin 300 MG capsule Commonly known as: NEURONTIN   linaclotide 290 MCG Caps capsule Commonly known as: LINZESS   linezolid 600 MG tablet Commonly known as: Zyvox   omeprazole 40 MG capsule Commonly known as: PRILOSEC  tiotropium 18 MCG inhalation capsule Commonly known as: SPIRIVA     TAKE these medications   carvedilol 6.25 MG tablet Commonly known as: COREG Take 1 tablet (6.25 mg total) by mouth 2 (two) times daily with a meal. Increased from 3.125 mg. What changed:   how much to take  additional instructions   furosemide 20 MG tablet Commonly known as: Lasix Take 2 tablets (40 mg total) by mouth daily. Increased from 20 mg daily. What changed:    how much to take  additional instructions   Lantus SoloStar 100 UNIT/ML Solostar Pen Generic drug: insulin glargine Inject 30 Units into the skin daily at 10 pm.   lisinopril 5 MG tablet Commonly known as: ZESTRIL Take 1 tablet (5 mg total) by mouth daily. New medication for your heart. Start taking on: January 01, 2021   NovoLOG FlexPen 100 UNIT/ML FlexPen Generic drug: insulin aspart Inject 1-15 Units into the skin 3 (three) times daily with meals. Dose based on CBG and carb count, 1 unit for every 15 carb.        Follow-up Information    [facility]. Schedule an appointment as soon as possible for a visit in 1 week(s).   Contact information: [address] [place] [address] [phone]        Marcina Millard, MD. Schedule an appointment as soon as possible for a visit in 1 week(s).   Specialty: Cardiology Contact information: [address] [facility] [place] [address] [phone]               Allergies  Allergen Reactions  . Codeine Anaphylaxis and Nausea And Vomiting  . Penicillins Rash    As a teenager  . Tramadol Itching     The results of significant diagnostics from this hospitalization (including imaging, microbiology, ancillary and laboratory) are listed below for reference.   Consultations:   Procedures/Studies: CT CHEST WO CONTRAST  Result Date: 12/03/2020 CLINICAL DATA:  COVID pneumonia. EXAM: CT CHEST WITHOUT CONTRAST TECHNIQUE: Multidetector CT imaging of the chest was performed following the standard protocol without IV contrast. COMPARISON:  Chest CT 05/18/2017 FINDINGS: Cardiovascular: The heart is normal in size. No pericardial effusion. The endotracheal tube is in good position just above the carina. There is an NG tube coursing down the esophagus and into the stomach. The right IJ central venous catheter is in good position. Mediastinum/Nodes: Borderline enlarged mediastinal  and hilar lymph nodes likely reactive/inflammatory. Lungs/Pleura: Underlying emphysematous changes are noted with superimposed severe interstitial process in the upper lobes with areas of cystic change which could be bronchiectasis or pneumatocele see. There is fairly dense airspace consolidation in both lower lobes with suspected areas of mild cavitation most notably in the right lower lobe medially. There are also small bilateral pleural effusions, right larger than left. No pneumothorax or pneumomediastinum. Upper Abdomen: Upper abdominal ascites is noted with fluid around the gallbladder, in the hepato renal fossa and pericolic gutters. Musculoskeletal: No significant bony findings. IMPRESSION: 1. Underlying emphysematous changes with superimposed severe interstitial process in the upper lobes with areas of cystic change which could be bronchiectasis or pneumatoceles. 2. Dense airspace consolidation in both lower lobes with suspected areas of mild cavitation most notably in the right lower lobe medially. 3. Small bilateral pleural effusions, right larger than left. 4. Upper abdominal ascites. 5. Lines and tubes in good position without complicating features. Emphysema (ICD10-J43.9). Electronically Signed   By: Rudie Meyer M.D.   On: 12/03/2020  13:20   DG Chest Portable 1 View  Result Date: 12/28/2020 CLINICAL DATA:  Shortness of breath. EXAM: PORTABLE CHEST 1 VIEW COMPARISON:  Chest x-ray 12/07/2020, CT chest 12/03/2020 FINDINGS: The heart size and mediastinal contours are within normal limits. Biapical pleural/pulmonary scarring. Patchy airspace opacity of the right lower lobe and inferior portion of the right upper lobe is again noted. Persistent coarsened and increased interstitial markings. Redemonstration of a trace to small volume right pleural effusion. Blunting of the left costophrenic angle likely representing a trace pleural effusion. No pneumothorax. No acute osseous abnormality. IMPRESSION: 1.  Right lower lobe infection/inflammation with persistent trace to small volume right pleural effusion. 2. Trace left pleural effusion. 3. Persistent coarsened and increased interstitial markings. Likely combination of pulmonary edema and infection/inflammation. Electronically Signed   By: Tish FredericksonMorgane  Naveau M.D.   On: 12/28/2020 03:42   DG Chest Port 1 View  Result Date: 12/07/2020 CLINICAL DATA:  Dyspnea. EXAM: PORTABLE CHEST 1 VIEW COMPARISON:  December 05, 2020. FINDINGS: Improved diffuse interstitial opacities with slight worsening of dense left basilar consolidation. Small bilateral pleural effusions, likely slightly increased on the right. No visible pneumothorax. Cardiomediastinal silhouette is within normal limits. IMPRESSION: 1. Improved diffuse interstitial opacities with slight worsening of dense left basilar consolidation. 2. Small bilateral pleural effusions, likely slightly increased on the right. Electronically Signed   By: Feliberto HartsFrederick S Jones MD   On: 12/07/2020 13:42   DG Chest Port 1 View  Result Date: 12/05/2020 CLINICAL DATA:  COVID positive with severe MRSA septic shock EXAM: PORTABLE CHEST 1 VIEW COMPARISON:  12/02/2020 FINDINGS: Right arm PICC line tip is at the cavoatrial junction. Interval removal of NG tube and ET tube. Heart size is normal. Bilateral scratch set small bilateral pleural effusions are again noted. Diffuse interstitial and airspace densities appear unchanged from previous exam. IMPRESSION: 1. Persistent bilateral pleural effusions and bilateral interstitial and airspace opacities compatible with multifocal atypical viral pneumonia. 2. Stable appearance of the right arm PICC line. Electronically Signed   By: Signa Kellaylor  Stroud M.D.   On: 12/05/2020 07:01   DG CHEST PORT 1 VIEW  Result Date: 12/02/2020 CLINICAL DATA:  Intubated, COVID EXAM: PORTABLE CHEST 1 VIEW COMPARISON:  12/01/2020 FINDINGS: Endotracheal tube is 3 cm above the carina. NG tube is in the stomach. Layering  bilateral pleural effusions. Interstitial prominence throughout the lungs. Bilateral lower lobe airspace disease. No real change since prior study. IMPRESSION: Persistent interstitial prominence and pulmonary infiltrates. Layering bilateral effusions. No change. Electronically Signed   By: Charlett NoseKevin  Dover M.D.   On: 12/02/2020 06:33   DG Chest Portable 1 View  Result Date: 12/01/2020 CLINICAL DATA:  RIGHT pleural effusion, COVID-19, sepsis EXAM: PORTABLE CHEST 1 VIEW COMPARISON:  Portable exam at 1345 hrs compared to 0921 hrs FINDINGS: Tip of endotracheal tube projects 1.9 cm above carina. NG tube extends into stomach. Stable heart size and mediastinal contours normal. Persistent diffuse pulmonary infiltrates unchanged. Persistent small RIGHT pleural effusion. No pneumothorax following RIGHT thoracentesis. IMPRESSION: No pneumothorax following diagnostic RIGHT thoracentesis. Persistent pulmonary infiltrates and small RIGHT pleural effusion. Electronically Signed   By: Ulyses SouthwardMark  Boles M.D.   On: 12/01/2020 14:20   DG CHEST PORT 1 VIEW  Result Date: 12/01/2020 CLINICAL DATA:  35 year old ventilated female status post central line placement. EXAM: PORTABLE CHEST 1 VIEW COMPARISON:  Chest x-ray 11/30/2020. FINDINGS: An endotracheal tube is in place with tip 3.3 cm above the carina. There is a right-sided internal jugular central venous catheter with tip  terminating in the distal superior vena cava. Widespread areas of ill-defined opacities and areas of interstitial prominence are noted throughout the lungs bilaterally, most evident throughout the mid to lower lungs, minimally improved compared to the prior examination. Moderate right and small left pleural effusions. No pneumothorax. Pulmonary vasculature is largely obscured. Heart size is normal. The patient is rotated to the left on today's exam, resulting in distortion of the mediastinal contours and reduced diagnostic sensitivity and specificity for mediastinal  pathology. IMPRESSION: 1. Support apparatus, as above. 2. The appearance of the lungs is most compatible with severe multilobar bilateral pneumonia related to known COVID infection. Overall, aeration has slightly improved compared to the prior study. 3. Moderate right and small left pleural effusions similar to the prior examination. Electronically Signed   By: Trudie Reed M.D.   On: 12/01/2020 09:37   ECHOCARDIOGRAM COMPLETE  Result Date: 12/03/2020    ECHOCARDIOGRAM REPORT   Patient Name:   GILA HOMSTAD Date of Exam: 12/03/2020 Medical Rec #:  [id number]         Height: Accession #:    [id number]        Weight: Date of Birth:  11/25/1985         BSA: Patient Age:    34 years          BP:           120/89 mmHg Patient Gender: F                 HR:           77 bpm. [facility] Procedure: 2D Echo Indications:    Endocarditis I38  History:        Patient has prior history of Echocardiogram examinations, most                 recent 03/09/2019. Signs/Symptoms:Murmur; Risk Factors:Current                 Smoker and Diabetes. Substance induced mood disorder ,                 Pneumonia, Cocaine Abuse.  Sonographer:    Jeryl Columbia RDCS (AE) Referring Phys: [id number] RAKESH V ALVA IMPRESSIONS  1. Left ventricular ejection fraction, by estimation, is 30 to 35%. The left ventricle has moderately decreased function. The left ventricle demonstrates regional wall motion abnormalities (see scoring diagram/findings for description). Left ventricular  diastolic parameters were normal.  2. Right ventricular systolic function is moderately reduced. The right ventricular size is normal. There is normal pulmonary artery systolic pressure. The estimated right ventricular systolic pressure is 22.0 mmHg.  3. The mitral valve is grossly normal. Mild to moderate mitral valve regurgitation.  4. The aortic valve is tricuspid. Aortic valve regurgitation is not visualized.  5. The inferior vena cava is normal in size  with greater than 50% respiratory variability, suggesting right atrial pressure of 3 mmHg.  6. No obvious valvular vegetations. FINDINGS  Left Ventricle: Left ventricular ejection fraction, by estimation, is 30 to 35%. The left ventricle has moderately decreased function. The left ventricle demonstrates regional wall motion abnormalities. The left ventricular internal cavity size was normal in size. There is no left ventricular hypertrophy. Left ventricular diastolic parameters were normal.  LV Wall Scoring: The mid and distal anterior wall, mid and distal anterior septum, entire apex, mid and distal inferior wall, and mid inferoseptal segment are akinetic. The basal anteroseptal segment, mid inferolateral segment, and mid anterolateral  segment are hypokinetic. The basal inferolateral segment, basal anterolateral segment, basal anterior segment, basal inferior segment, and basal inferoseptal segment are normal. Right Ventricle: The right ventricular size is normal. No increase in right ventricular wall thickness. Right ventricular systolic function is moderately reduced. There is normal pulmonary artery systolic pressure. The tricuspid regurgitant velocity is 2.18 m/s, and with an assumed right atrial pressure of 3 mmHg, the estimated right ventricular systolic pressure is 22.0 mmHg. Left Atrium: Left atrial size was normal in size. Right Atrium: Right atrial size was normal in size. Pericardium: There is no evidence of pericardial effusion. Mitral Valve: The mitral valve is grossly normal. Mild to moderate mitral valve regurgitation, with eccentric posteriorly directed jet. Tricuspid Valve: The tricuspid valve is grossly normal. Tricuspid valve regurgitation is mild. Aortic Valve: The aortic valve is tricuspid. Aortic valve regurgitation is not visualized. Pulmonic Valve: The pulmonic valve was grossly normal. Pulmonic valve regurgitation is not visualized. Aorta: The aortic root is normal in size and structure.  Venous: The inferior vena cava is normal in size with greater than 50% respiratory variability, suggesting right atrial pressure of 3 mmHg. IAS/Shunts: No atrial level shunt detected by color flow Doppler.  LEFT VENTRICLE PLAX 2D LVIDd:         3.72 cm  Diastology LVIDs:         3.15 cm  LV e' medial:    6.53 cm/s LV PW:         1.00 cm  LV E/e' medial:  9.7 LV IVS:        0.80 cm  LV e' lateral:   6.42 cm/s LVOT diam:     1.80 cm  LV E/e' lateral: 9.9 LVOT Area:     2.54 cm  RIGHT VENTRICLE RV S prime:     7.29 cm/s LEFT ATRIUM             RIGHT ATRIUM LA diam:        2.20 cm RA Area:     9.12 cm LA Vol (A2C):   24.3 ml RA Volume:   20.90 ml LA Vol (A4C):   28.3 ml LA Biplane Vol: 27.0 ml   AORTA Ao Root diam: 2.40 cm MITRAL VALVE               TRICUSPID VALVE MV Area (PHT): 4.21 cm    TR Peak grad:   19.0 mmHg MV Decel Time: 180 msec    TR Vmax:        218.00 cm/s MV E velocity: 63.40 cm/s MV A velocity: 70.70 cm/s  SHUNTS MV E/A ratio:  0.90        Systemic Diam: 1.80 cm Nona Dell MD Electronically signed by Nona Dell MD Signature Date/Time: 12/03/2020/4:11:56 PM    Final    [place] EKG SITE RITE  Result Date: 12/04/2020 If Site Rite image not attached, placement could not be confirmed due to current cardiac rhythm.  US THORACENTESIS ASP PLEURAL SPACE W/IMG GUIDE  Result Date: 12/01/2020 INDICATION: COVID-19 pneumonia, sepsis, small RIGHT pleural effusion EXAM: ULTRASOUND GUIDED DIAGNOSTIC RIGHT THORACENTESIS MEDICATIONS: None. COMPLICATIONS: None immediate. PROCEDURE: An ultrasound guided thoracentesis was thoroughly discussed with the patient and questions answered. The benefits, risks, alternatives and complications were also discussed. The patient understands and wishes to proceed with the procedure. Written consent was obtained. Ultrasound was performed to localize and mark an adequate pocket of fluid in the RIGHT chest. The area was then prepped and draped in the normal sterile  fashion. 1%  Lidocaine was used for local anesthesia. Under ultrasound guidance a a syringe with a 22 gauge needle was advanced into the small RIGHT pleural effusion. 16 cc of fluid was performed. The catheter was removed and a dressing applied. FINDINGS: A total of approximately 16 mL of slightly cloudy yellow fluid was removed. Sample was sent to the laboratory as requested by the clinical team. IMPRESSION: Successful ultrasound guided right thoracentesis yielding 16 mL of pleural fluid. Electronically Signed   By: Ulyses Southward M.D.   On: 12/01/2020 14:24      Labs: BNP (last 3 results) Recent Labs    12/28/20 0301  BNP 359.6*   Basic Metabolic Panel: Recent Labs  Lab 12/28/20 0301 12/28/20 0421 12/29/20 0611 12/30/20 0722 12/31/20 0421  NA 139 141 136 133* 136  K 4.1 4.0 3.6 4.2 3.6  CL 105 108 99 94* 94*  CO2 29 28 30  32 33*  GLUCOSE 278* 245* 144* 423* 168*  BUN 17 16 23* 25* 31*  CREATININE 0.63 0.59 0.43* 0.63 0.53  CALCIUM 8.2* 8.0* 8.2* 8.4* 8.6*  MG  --   --   --   --  2.0   Liver Function Tests: No results for input(s): AST, ALT, ALKPHOS, BILITOT, PROT, ALBUMIN in the last 168 hours. No results for input(s): LIPASE, AMYLASE in the last 168 hours. No results for input(s): AMMONIA in the last 168 hours. CBC: Recent Labs  Lab 12/28/20 0301 12/29/20 0611 12/29/20 1921 12/30/20 0722 12/31/20 0421  WBC 7.9 7.8  --  9.2 10.3  HGB 7.4* 6.8* 8.6* 9.5* 9.9*  HCT 23.6* 21.0* 26.2* 28.5* 28.8*  MCV 90.8 89.0  --  88.0 85.2  PLT 363 358  --  371 472*   Cardiac Enzymes: No results for input(s): CKTOTAL, CKMB, CKMBINDEX, TROPONINI in the last 168 hours. BNP: Invalid input(s): POCBNP CBG: Recent Labs  Lab 12/30/20 1606 12/30/20 1622 12/30/20 1643 12/30/20 1955 12/31/20 0842  GLUCAP 89 63* 68* 163* 225*   D-Dimer No results for input(s): DDIMER in the last 72 hours. Hgb A1c No results for input(s): HGBA1C in the last 72 hours. Lipid Profile No results for input(s):  CHOL, HDL, LDLCALC, TRIG, CHOLHDL, LDLDIRECT in the last 72 hours. Thyroid function studies No results for input(s): TSH, T4TOTAL, T3FREE, THYROIDAB in the last 72 hours.  Invalid input(s): FREET3 Anemia work up No results for input(s): VITAMINB12, FOLATE, FERRITIN, TIBC, IRON, RETICCTPCT in the last 72 hours. Urinalysis    Component Value Date/Time   COLORURINE STRAW (A) 11/30/2020 0949   APPEARANCEUR CLEAR 11/30/2020 0949   LABSPEC 1.022 11/30/2020 0949   PHURINE 5.0 11/30/2020 0949   GLUCOSEU >=500 (A) 11/30/2020 0949   HGBUR MODERATE (A) 11/30/2020 0949   BILIRUBINUR NEGATIVE 11/30/2020 0949   KETONESUR 20 (A) 11/30/2020 0949   PROTEINUR NEGATIVE 11/30/2020 0949   UROBILINOGEN 1.0 07/28/2015 1402   NITRITE NEGATIVE 11/30/2020 0949   LEUKOCYTESUR NEGATIVE 11/30/2020 0949   Sepsis Labs Invalid input(s): PROCALCITONIN,  WBC,  LACTICIDVEN Microbiology Recent Results (from the past 240 hour(s))  Blood culture (routine x 2)     Status: None (Preliminary result)   Collection Time: 12/28/20  4:21 AM   Specimen: BLOOD  Result Value Ref Range Status   Specimen Description BLOOD LEFT AC  Final   Special Requests   Final    BOTTLES DRAWN AEROBIC AND ANAEROBIC Blood Culture adequate volume   Culture   Final    NO GROWTH 3 DAYS  Performed at [facility], [address] [place], [place] [address]    Report Status PENDING  Incomplete  Blood culture (routine x 2)     Status: None (Preliminary result)   Collection Time: 12/28/20  4:21 AM   Specimen: BLOOD  Result Value Ref Range Status   Specimen Description BLOOD LEFT HAND  Final   Special Requests   Final    BOTTLES DRAWN AEROBIC AND ANAEROBIC Blood Culture adequate volume   Culture   Final    NO GROWTH 3 DAYS Performed at [facility], [address] [place], [place] [address]    Report Status PENDING  Incomplete  MRSA PCR Screening     Status: None   Collection Time: 12/30/20 10:21 AM   Specimen: Nasal  Mucosa; Nasopharyngeal  Result Value Ref Range Status   MRSA by PCR NEGATIVE NEGATIVE Final    Comment:        The GeneXpert MRSA Assay (FDA approved for NASAL specimens only), is one component of a comprehensive MRSA colonization surveillance program. It is not intended to diagnose MRSA infection nor to guide or monitor treatment for MRSA infections. Performed at [facility], [address] [place], [place] [address]      Total time spend on discharging this patient, including the last patient exam, discussing the hospital stay, instructions for ongoing care as it relates to all pertinent caregivers, as well as preparing the medical discharge records, prescriptions, and/or referrals as applicable, is 30 minutes.    Darlin Priestly, MD  [facility] 12/31/2020, 9:20 AM

## 2020-12-31 NOTE — Progress Notes (Signed)
[facility] Cardiology  SUBJECTIVE: Patient laying in bed, reports feeling good, denies chest pain or shortness of breath   Vitals:   12/30/20 2317 12/31/20 0500 12/31/20 0544 12/31/20 0717  BP: 130/79  128/72 127/73  Pulse: 75  73 75  Resp: 16  16 18   Temp: 98.7 F (37.1 C)  99.3 F (37.4 C) 98.3 F (36.8 C)  TempSrc: Oral  Oral Oral  SpO2: 95%  94% 97%  Weight:  46.1 kg    Height:         Intake/Output Summary (Last 24 hours) at 12/31/2020 0840 Last data filed at 12/31/2020 0700 Gross per 24 hour  Intake 120 ml  Output 1750 ml  Net -1630 ml      PHYSICAL EXAM  General: Well developed, well nourished, in no acute distress HEENT:  Normocephalic and atramatic Neck:  No JVD.  Lungs: Clear bilaterally to auscultation and percussion. Heart: HRRR . Normal S1 and S2 without gallops or murmurs.  Abdomen: Bowel sounds are positive, abdomen soft and non-tender  Msk:  Back normal, normal gait. Normal strength and tone for age. Extremities: No clubbing, cyanosis or edema.   Neuro: Alert and oriented X 3. Psych:  Good affect, responds appropriately   LABS: Basic Metabolic Panel: Recent Labs    12/30/20 0722 12/31/20 0421  NA 133* 136  K 4.2 3.6  CL 94* 94*  CO2 32 33*  GLUCOSE 423* 168*  BUN 25* 31*  CREATININE 0.63 0.53  CALCIUM 8.4* 8.6*  MG  --  2.0   Liver Function Tests: No results for input(s): AST, ALT, ALKPHOS, BILITOT, PROT, ALBUMIN in the last 72 hours. No results for input(s): LIPASE, AMYLASE in the last 72 hours. CBC: Recent Labs    12/30/20 0722 12/31/20 0421  WBC 9.2 10.3  HGB 9.5* 9.9*  HCT 28.5* 28.8*  MCV 88.0 85.2  PLT 371 472*   Cardiac Enzymes: No results for input(s): CKTOTAL, CKMB, CKMBINDEX, TROPONINI in the last 72 hours. BNP: Invalid input(s): POCBNP D-Dimer: No results for input(s): DDIMER in the last 72 hours. Hemoglobin A1C: No results for input(s): HGBA1C in the last 72 hours. Fasting Lipid Panel: No results for input(s): CHOL,  HDL, LDLCALC, TRIG, CHOLHDL, LDLDIRECT in the last 72 hours. Thyroid Function Tests: No results for input(s): TSH, T4TOTAL, T3FREE, THYROIDAB in the last 72 hours.  Invalid input(s): FREET3 Anemia Panel: No results for input(s): VITAMINB12, FOLATE, FERRITIN, TIBC, IRON, RETICCTPCT in the last 72 hours.  No results found.   Echo LVEF 30 to 35% by 2D echocardiogram 12/03/2020  TELEMETRY: Sinus rhythm:  ASSESSMENT AND PLAN:  Active Problems:   Acute CHF (congestive heart failure) (HCC)    1. Acute on chronic systolic CHF,with LVEF 30-35% per recent echocardiogram2/17/2022, which is much lower compared to echo in 2020, which showed LVEF 60%;likelydue to polysubstance abuse. BNP >300. Patient clinically improved after diuresis with IV furosemide with improved breathing, less pedal edema, appears euvolemic 2.Right lobar pneumonia with recent history of MRSA pneumonia with parapneumonic effusion, currently on IV vancomycin, appears clinically stable, no fever or cough. 3.Anemia,Hb/Hct 8.6 and 26.2, respectively, appears stable 4.Uncontrolled type I diabetes  Recommendations  1.Agree with overall current therapy 2.Continue diuresis, position to p.o. furosemide 3.Carefully monitor renal status 4.Continue carvedilol 6.25 mg twice daily 5. Continue lisinopril 5 mg daily 6.Strongly advised patient to stop polysubstance abuse 7.CHF clinic consult 8.  Follow-up 1 to 2 weeks following discharge  We will sign off for now, please call if  any questions   Marcina Millard, MD, PhD, [facility] 12/31/2020 8:40 AM

## 2020-12-31 NOTE — Plan of Care (Signed)
Patient is ready for discharge per MD order

## 2021-01-01 ENCOUNTER — Telehealth: Payer: Self-pay

## 2021-01-02 LAB — CULTURE, BLOOD (ROUTINE X 2)
Culture: NO GROWTH
Culture: NO GROWTH
Special Requests: ADEQUATE
Special Requests: ADEQUATE

## 2021-01-05 NOTE — Progress Notes (Signed)
Patient ID: [name], female    DOB: 1986-01-05, 35 y.o.   MRN: [id number]  HPI  [name] is a 35 y/o female with a history of DM, HTN, substance use, current tobacco use and chronic heart failure.   Echo report from 12/03/20 reviewed and showed an EF of 30-35% along with normal PA pressure and mild/moderate MR.   Admitted 12/28/20 due to acute on chronic HF. Cardiology consult obtained. Initially given IV lasix with transition to oral diuretics. IV antibiotics given for pneumonia. Discharged after 3 days. Admitted 11/30/20 due to respiratory failure due to COVID with subsequent pneumonia and septic shock. Room air pulse ox initially 66% with hyperglycemia of 600. Given systemic steroids and remdesivir. Required vasopressors due to shock. Weaned off ventilator. Left AMA after 13 days.   She presents today for her initial visit with a chief complaint of minimal fatigue upon moderate exertion. She describes this as having been present for several months although has improved greatly since recent admission. She has associated productive cough, intermittent light-headedness, leg weakness and leg pain along with this. She denies any difficulty sleeping, abdominal distention, palpitations, pedal edema, chest pain, shortness of breath or weight gain.   She and her aunt both say that patient isn't drinking much fluids because she's scared of drinking too much. Estimates that she barely drinks 18 ounces of fluid/ day.   Past Medical History:  Diagnosis Date  . Acid reflux 2010  . CHF (congestive heart failure) (HCC)   . Diabetes mellitus   . DKA 05/01/2011  . Hypertension   . Polysubstance abuse [facility])    Past Surgical History:  Procedure Laterality Date  . TUBAL LIGATION    . WOUND EXPLORATION Left 07/26/2014   Procedure: Irrigation, debridement and exploration of elbow wounds;  Surgeon: Betha Loa, MD;  Location: [facility] OR;  Service: Orthopedics;  Laterality: Left;   Family History   Problem Relation Age of Onset  . Coronary artery disease Mother   . Lung cancer Mother   . Heart attack Father 97  . Thyroid disease Maternal Grandmother   . Diabetes type I Daughter    Social History   Tobacco Use  . Smoking status: Current Every Day Smoker    Packs/day: 1.00    Years: 15.00    Pack years: 15.00    Types: Cigarettes  . Smokeless tobacco: Never Used  . Tobacco comment: She is currently trying to quit.  Substance Use Topics  . Alcohol use: No   Allergies  Allergen Reactions  . Codeine Anaphylaxis and Nausea And Vomiting  . Penicillins Rash    As a teenager  . Tramadol Itching   Prior to Admission medications   Medication Sig Start Date End Date Taking? Authorizing Provider  carvedilol (COREG) 6.25 MG tablet Take 1 tablet (6.25 mg total) by mouth 2 (two) times daily with a meal. Increased from 3.125 mg. 12/31/20 03/31/21 Yes Darlin Priestly, MD  furosemide (LASIX) 20 MG tablet Take 2 tablets (40 mg total) by mouth daily. Increased from 20 mg daily. 12/31/20 03/31/21 Yes Darlin Priestly, MD  insulin aspart (NOVOLOG FLEXPEN) 100 UNIT/ML FlexPen Inject 1-15 Units into the skin 3 (three) times daily with meals. Dose based on CBG and carb count, 1 unit for every 15 carb.   Yes [provider]  insulin glargine (LANTUS SOLOSTAR) 100 UNIT/ML Solostar Pen Inject 30 Units into the skin daily at 10 pm. 12/13/20 02/11/21 Yes Tat, Onalee Hua, MD  lisinopril (ZESTRIL) 5 MG  tablet Take 1 tablet (5 mg total) by mouth daily. New medication for your heart. 01/01/21 04/01/21 Yes Darlin Priestly, MD  Multiple Vitamin (MULTIVITAMIN) tablet Take 1 tablet by mouth daily.   Yes [provider]  omeprazole (PRILOSEC) 40 MG capsule Take 40 mg by mouth daily.   Yes [provider]    Review of Systems  Constitutional: Positive for fatigue (at the end of the day). Negative for appetite change.  HENT: Negative for congestion, postnasal drip and sore throat.   Eyes: Negative.   Respiratory:  Positive for cough. Negative for chest tightness and shortness of breath.   Cardiovascular: Negative for chest pain, palpitations and leg swelling.  Gastrointestinal: Negative for abdominal distention and abdominal pain.  Endocrine: Negative.   Genitourinary: Negative.   Musculoskeletal: Positive for arthralgias (leg pain). Negative for back pain.  Skin: Negative.   Allergic/Immunologic: Negative.   Neurological: Positive for weakness and light-headedness (sometimes).  Hematological: Negative for adenopathy. Does not bruise/bleed easily.  Psychiatric/Behavioral: Negative for dysphoric mood and sleep disturbance (sleeping flat). The patient is nervous/anxious.    .[email or web address] Lab Results  Component Value Date   CREATININE 0.53 12/31/2020   CREATININE 0.63 12/30/2020   CREATININE 0.43 (L) 12/29/2020    Physical Exam Vitals and nursing note reviewed. Exam conducted with a chaperone present (aunt).  Constitutional:      Appearance: Normal appearance.  HENT:     Head: Normocephalic and atraumatic.  Cardiovascular:     Rate and Rhythm: Normal rate and regular rhythm.  Pulmonary:     Effort: Pulmonary effort is normal. No respiratory distress.     Breath sounds: No wheezing or rales.  Abdominal:     General: There is no distension.     Palpations: Abdomen is soft.     Tenderness: There is no abdominal tenderness.  Musculoskeletal:     Cervical back: Normal range of motion and neck supple.     Right lower leg: No edema.     Left lower leg: No edema.  Skin:    General: Skin is warm and dry.  Neurological:     General: No focal deficit present.     Mental Status: She is alert and oriented to person, place, and time.  Psychiatric:        Mood and Affect: Mood normal.        Behavior: Behavior normal.        Thought Content: Thought content normal.    Assessment & Plan:  1: Chronic heart failure with reduced ejection fraction- - NYHA class II - euvolemic today - weighing  daily and says that her weight has been stable; instructed to call for an overnight weight gain of > 2 pounds or a weekly weight gain of > 5 pounds - not adding salt and has started reading food labels for sodium content; does admit to eating out often and likes to eat bacon; reviewed the importance of closely following a 2000mg  sodium diet and written dietary information and low sodium cookbook were given to her - reviewed how to make good choices when eating out and to eat bacon if possible - reviewed that she should drink between 50-60 ounces of fluid daily and rationale for this was explained - sees cardiology [place]) 01/12/21  - currently on GDMT of carvedilol and lisinopril; consider titrating up at future visits - consider adding spironolactone in the future - doubt current BP could tolerate changing her lisinopril to entresto - not SGLT2 candidate  due to being a type 1 diabetic - BNP 12/28/20 was 359.6  2: HTN- - BP looks good although on the low side (112/81) - sees PCP at [facility] in [place] next week - BMP 12/31/20 reviewed and showed sodium 136, potassium 3.6, creatinine 0.53 and GFR >60  3: DM- - A1c 11/30/20 was 14.7% - glucose at home today was 104  4: Substance use- - smoking 1/2 ppd of cigarettes - complete cessation discussed for 3 minutes with her - denies any alcohol or drug use   Medication bottles reviewed.   Return in 1 month or sooner for any questions/problems before then.

## 2021-01-06 ENCOUNTER — Other Ambulatory Visit: Payer: Self-pay

## 2021-01-06 ENCOUNTER — Encounter: Payer: Self-pay | Admitting: Family

## 2021-01-06 ENCOUNTER — Ambulatory Visit: Payer: Medicaid Other | Attending: Family | Admitting: Family

## 2021-01-06 VITALS — BP 112/81 | HR 88 | Resp 18 | Ht 60.0 in | Wt 95.0 lb

## 2021-01-06 DIAGNOSIS — Z794 Long term (current) use of insulin: Secondary | ICD-10-CM | POA: Diagnosis not present

## 2021-01-06 DIAGNOSIS — E1069 Type 1 diabetes mellitus with other specified complication: Secondary | ICD-10-CM

## 2021-01-06 DIAGNOSIS — Z8616 Personal history of COVID-19: Secondary | ICD-10-CM | POA: Insufficient documentation

## 2021-01-06 DIAGNOSIS — E1065 Type 1 diabetes mellitus with hyperglycemia: Secondary | ICD-10-CM | POA: Insufficient documentation

## 2021-01-06 DIAGNOSIS — I110 Hypertensive heart disease with heart failure: Secondary | ICD-10-CM | POA: Insufficient documentation

## 2021-01-06 DIAGNOSIS — Z79899 Other long term (current) drug therapy: Secondary | ICD-10-CM | POA: Insufficient documentation

## 2021-01-06 DIAGNOSIS — F1910 Other psychoactive substance abuse, uncomplicated: Secondary | ICD-10-CM

## 2021-01-06 DIAGNOSIS — F17210 Nicotine dependence, cigarettes, uncomplicated: Secondary | ICD-10-CM | POA: Insufficient documentation

## 2021-01-06 DIAGNOSIS — I10 Essential (primary) hypertension: Secondary | ICD-10-CM

## 2021-01-06 DIAGNOSIS — I509 Heart failure, unspecified: Secondary | ICD-10-CM | POA: Insufficient documentation

## 2021-01-06 DIAGNOSIS — Z8249 Family history of ischemic heart disease and other diseases of the circulatory system: Secondary | ICD-10-CM | POA: Diagnosis not present

## 2021-01-06 DIAGNOSIS — I5022 Chronic systolic (congestive) heart failure: Secondary | ICD-10-CM

## 2021-01-06 NOTE — Patient Instructions (Addendum)
Continue weighing daily and call for an overnight weight gain of > 2 pounds or a weekly weight gain of >5 pounds.   Drink between 50-60 ounces of fluids daily.     Low-Sodium Eating Plan Sodium, which is an element that makes up salt, helps you maintain a healthy balance of fluids in your body. Too much sodium can increase your blood pressure and cause fluid and waste to be held in your body. Your health care provider or dietitian may recommend following this plan if you have high blood pressure (hypertension), kidney disease, liver disease, or heart failure. Eating less sodium can help lower your blood pressure, reduce swelling, and protect your heart, liver, and kidneys. What are tips for following this plan? Reading food labels  The Nutrition Facts label lists the amount of sodium in one serving of the food. If you eat more than one serving, you must multiply the listed amount of sodium by the number of servings.  Choose foods with less than 140 mg of sodium per serving.  Avoid foods with 300 mg of sodium or more per serving. Shopping  Look for lower-sodium products, often labeled as "low-sodium" or "no salt added."  Always check the sodium content, even if foods are labeled as "unsalted" or "no salt added."  Buy fresh foods. ? Avoid canned foods and pre-made or frozen meals. ? Avoid canned, cured, or processed meats.  Buy breads that have less than 80 mg of sodium per slice.   Cooking  Eat more home-cooked food and less restaurant, buffet, and fast food.  Avoid adding salt when cooking. Use salt-free seasonings or herbs instead of table salt or sea salt. Check with your health care provider or pharmacist before using salt substitutes.  Cook with plant-based oils, such as canola, sunflower, or olive oil.   Meal planning  When eating at a restaurant, ask that your food be prepared with less salt or no salt, if possible. Avoid dishes labeled as brined, pickled, cured, smoked,  or made with soy sauce, miso, or teriyaki sauce.  Avoid foods that contain MSG (monosodium glutamate). MSG is sometimes added to [place] food, bouillon, and some canned foods.  Make meals that can be grilled, baked, poached, roasted, or steamed. These are generally made with less sodium. General information Most people on this plan should limit their sodium intake to 2,000 mg (milligrams) of sodium each day. What foods should I eat? Fruits Fresh, frozen, or canned fruit. Fruit juice. Vegetables Fresh or frozen vegetables. "No salt added" canned vegetables. "No salt added" tomato sauce and paste. Low-sodium or reduced-sodium tomato and vegetable juice. Grains Low-sodium cereals, including oats, puffed wheat and rice, and shredded wheat. Low-sodium crackers. Unsalted rice. Unsalted pasta. Low-sodium bread. Whole-grain breads and whole-grain pasta. Meats and other proteins Fresh or frozen (no salt added) meat, poultry, seafood, and fish. Low-sodium canned tuna and salmon. Unsalted nuts. Dried peas, beans, and lentils without added salt. Unsalted canned beans. Eggs. Unsalted nut butters. Dairy Milk. Soy milk. Cheese that is naturally low in sodium, such as ricotta cheese, fresh mozzarella, or Swiss cheese. Low-sodium or reduced-sodium cheese. Cream cheese. Yogurt. Seasonings and condiments Fresh and dried herbs and spices. Salt-free seasonings. Low-sodium mustard and ketchup. Sodium-free salad dressing. Sodium-free light mayonnaise. Fresh or refrigerated horseradish. Lemon juice. Vinegar. Other foods Homemade, reduced-sodium, or low-sodium soups. Unsalted popcorn and pretzels. Low-salt or salt-free chips. The items listed above may not be a complete list of foods and beverages you can eat. Contact a  dietitian for more information. What foods should I avoid? Vegetables Sauerkraut, pickled vegetables, and relishes. Olives. [place] fries. Onion rings. Regular canned vegetables (not low-sodium or  reduced-sodium). Regular canned tomato sauce and paste (not low-sodium or reduced-sodium). Regular tomato and vegetable juice (not low-sodium or reduced-sodium). Frozen vegetables in sauces. Grains Instant hot cereals. Bread stuffing, pancake, and biscuit mixes. Croutons. Seasoned rice or pasta mixes. Noodle soup cups. Boxed or frozen macaroni and cheese. Regular salted crackers. Self-rising flour. Meats and other proteins Meat or fish that is salted, canned, smoked, spiced, or pickled. Precooked or cured meat, such as sausages or meat loaves. Tomasa Blase. Ham. Pepperoni. Hot dogs. Corned beef. Chipped beef. Salt pork. Jerky. Pickled herring. Anchovies and sardines. Regular canned tuna. Salted nuts. Dairy Processed cheese and cheese spreads. Hard cheeses. Cheese curds. Blue cheese. Feta cheese. String cheese. Regular cottage cheese. Buttermilk. Canned milk. Fats and oils Salted butter. Regular margarine. Ghee. Bacon fat. Seasonings and condiments Onion salt, garlic salt, seasoned salt, table salt, and sea salt. Canned and packaged gravies. Worcestershire sauce. Tartar sauce. Barbecue sauce. Teriyaki sauce. Soy sauce, including reduced-sodium. Steak sauce. Fish sauce. Oyster sauce. Cocktail sauce. Horseradish that you find on the shelf. Regular ketchup and mustard. Meat flavorings and tenderizers. Bouillon cubes. Hot sauce. Pre-made or packaged marinades. Pre-made or packaged taco seasonings. Relishes. Regular salad dressings. Salsa. Other foods Salted popcorn and pretzels. Corn chips and puffs. Potato and tortilla chips. Canned or dried soups. Pizza. Frozen entrees and pot pies. The items listed above may not be a complete list of foods and beverages you should avoid. Contact a dietitian for more information. Summary  Eating less sodium can help lower your blood pressure, reduce swelling, and protect your heart, liver, and kidneys.  Most people on this plan should limit their sodium intake to 1,500-2,000  mg (milligrams) of sodium each day.  Canned, boxed, and frozen foods are high in sodium. Restaurant foods, fast foods, and pizza are also very high in sodium. You also get sodium by adding salt to food.  Try to cook at home, eat more fresh fruits and vegetables, and eat less fast food and canned, processed, or prepared foods. This information is not intended to replace advice given to you by your health care provider. Make sure you discuss any questions you have with your health care provider. Document Revised: 11/08/2019 Document Reviewed: 09/04/2019 Elsevier Patient Education  2021 [employer].

## 2021-01-12 DIAGNOSIS — I10 Essential (primary) hypertension: Secondary | ICD-10-CM | POA: Insufficient documentation

## 2021-01-12 DIAGNOSIS — I5021 Acute systolic (congestive) heart failure: Secondary | ICD-10-CM

## 2021-02-04 NOTE — Progress Notes (Deleted)
Patient ID: [name], female    DOB: 1986-04-28, 35 y.o.   MRN: [id number]  HPI  Ms Rhee is a 35 y/o female with a history of DM, HTN, substance use, current tobacco use and chronic heart failure.   Echo report from 12/03/20 reviewed and showed an EF of 30-35% along with normal PA pressure and mild/moderate MR.   Admitted 12/28/20 due to acute on chronic HF. Cardiology consult obtained. Initially given IV lasix with transition to oral diuretics. IV antibiotics given for pneumonia. Discharged after 3 days. Admitted 11/30/20 due to respiratory failure due to COVID with subsequent pneumonia and septic shock. Room air pulse ox initially 66% with hyperglycemia of 600. Given systemic steroids and remdesivir. Required vasopressors due to shock. Weaned off ventilator. Left AMA after 13 days.   She presents today for a follow-up visit with a chief complaint of   Past Medical History:  Diagnosis Date  . Acid reflux 2010  . CHF (congestive heart failure) (HCC)   . Diabetes mellitus   . DKA 05/01/2011  . Hypertension   . Polysubstance abuse [facility])    Past Surgical History:  Procedure Laterality Date  . TUBAL LIGATION    . WOUND EXPLORATION Left 07/26/2014   Procedure: Irrigation, debridement and exploration of elbow wounds;  Surgeon: Betha Loa, MD;  Location: [facility] OR;  Service: Orthopedics;  Laterality: Left;   Family History  Problem Relation Age of Onset  . Coronary artery disease Mother   . Lung cancer Mother   . Heart attack Father 6  . Thyroid disease Maternal Grandmother   . Diabetes type I Daughter    Social History   Tobacco Use  . Smoking status: Current Every Day Smoker    Packs/day: 1.00    Years: 15.00    Pack years: 15.00    Types: Cigarettes  . Smokeless tobacco: Never Used  . Tobacco comment: She is currently trying to quit.  Substance Use Topics  . Alcohol use: No   Allergies  Allergen Reactions  . Codeine Anaphylaxis and Nausea And Vomiting  .  Penicillins Rash    As a teenager  . Tramadol Itching     Review of Systems  Constitutional: Positive for fatigue (at the end of the day). Negative for appetite change.  HENT: Negative for congestion, postnasal drip and sore throat.   Eyes: Negative.   Respiratory: Positive for cough. Negative for chest tightness and shortness of breath.   Cardiovascular: Negative for chest pain, palpitations and leg swelling.  Gastrointestinal: Negative for abdominal distention and abdominal pain.  Endocrine: Negative.   Genitourinary: Negative.   Musculoskeletal: Positive for arthralgias (leg pain). Negative for back pain.  Skin: Negative.   Allergic/Immunologic: Negative.   Neurological: Positive for weakness and light-headedness (sometimes).  Hematological: Negative for adenopathy. Does not bruise/bleed easily.  Psychiatric/Behavioral: Negative for dysphoric mood and sleep disturbance (sleeping flat). The patient is nervous/anxious.      Physical Exam Vitals and nursing note reviewed. Exam conducted with a chaperone present (aunt).  Constitutional:      Appearance: Normal appearance.  HENT:     Head: Normocephalic and atraumatic.  Cardiovascular:     Rate and Rhythm: Normal rate and regular rhythm.  Pulmonary:     Effort: Pulmonary effort is normal. No respiratory distress.     Breath sounds: No wheezing or rales.  Abdominal:     General: There is no distension.     Palpations: Abdomen is soft.  Tenderness: There is no abdominal tenderness.  Musculoskeletal:     Cervical back: Normal range of motion and neck supple.     Right lower leg: No edema.     Left lower leg: No edema.  Skin:    General: Skin is warm and dry.  Neurological:     General: No focal deficit present.     Mental Status: She is alert and oriented to person, place, and time.  Psychiatric:        Mood and Affect: Mood normal.        Behavior: Behavior normal.        Thought Content: Thought content normal.     Assessment & Plan:  1: Chronic heart failure with reduced ejection fraction- - NYHA class II - euvolemic today - weighing daily and says that her weight has been stable; instructed to call for an overnight weight gain of > 2 pounds or a weekly weight gain of > 5 pounds - weight 95 pounds from last visit here 1 month ago - not adding salt and has started reading food labels for sodium content; does admit to eating out often and likes to eat bacon; reviewed the importance of closely following a 2000mg  sodium diet   - reviewed that she should drink between 50-60 ounces of fluid daily and rationale for this was explained - saw cardiology ) 01/12/21  - currently on GDMT of carvedilol and lisinopril; consider titrating up at future visits - consider adding spironolactone in the future - doubt current BP could tolerate changing her lisinopril to entresto - not SGLT2 candidate due to being a type 1 diabetic - BNP 12/28/20 was 359.6  2: HTN- - BP  - sees PCP at [facility] in [place] next week - BMP 12/31/20 reviewed and showed sodium 136, potassium 3.6, creatinine 0.53 and GFR >60  3: DM- - A1c 11/30/20 was 14.7% - glucose at home today was   4: Substance use- - smoking 1/2 ppd of cigarettes - complete cessation discussed for 3 minutes with her - denies any alcohol or drug use   Medication bottles reviewed.

## 2021-02-05 ENCOUNTER — Telehealth: Payer: Self-pay | Admitting: Family

## 2021-02-05 ENCOUNTER — Ambulatory Visit: Payer: Medicaid Other | Admitting: Family

## 2021-02-05 NOTE — Telephone Encounter (Signed)
Patient did not show for her [facility] appointment on 02/05/21. Will attempt to reschedule.  

## 2021-03-08 ENCOUNTER — Emergency Department (HOSPITAL_COMMUNITY): Payer: Medicaid Other

## 2021-03-08 ENCOUNTER — Observation Stay (HOSPITAL_COMMUNITY)
Admission: EM | Admit: 2021-03-08 | Discharge: 2021-03-09 | Disposition: A | Discharge status: Home / Self Care | Payer: Medicaid Other | Source: Home / Self Care | Attending: Emergency Medicine | Admitting: Emergency Medicine

## 2021-03-08 ENCOUNTER — Other Ambulatory Visit: Payer: Self-pay

## 2021-03-08 ENCOUNTER — Encounter (HOSPITAL_COMMUNITY): Payer: Self-pay | Admitting: *Deleted

## 2021-03-08 DIAGNOSIS — Z79899 Other long term (current) drug therapy: Secondary | ICD-10-CM | POA: Insufficient documentation

## 2021-03-08 DIAGNOSIS — I110 Hypertensive heart disease with heart failure: Secondary | ICD-10-CM | POA: Insufficient documentation

## 2021-03-08 DIAGNOSIS — E1110 Type 2 diabetes mellitus with ketoacidosis without coma: Secondary | ICD-10-CM | POA: Diagnosis present

## 2021-03-08 DIAGNOSIS — I509 Heart failure, unspecified: Secondary | ICD-10-CM | POA: Insufficient documentation

## 2021-03-08 DIAGNOSIS — F17210 Nicotine dependence, cigarettes, uncomplicated: Secondary | ICD-10-CM | POA: Insufficient documentation

## 2021-03-08 DIAGNOSIS — F1910 Other psychoactive substance abuse, uncomplicated: Secondary | ICD-10-CM | POA: Insufficient documentation

## 2021-03-08 DIAGNOSIS — Z794 Long term (current) use of insulin: Secondary | ICD-10-CM | POA: Insufficient documentation

## 2021-03-08 DIAGNOSIS — E1010 Type 1 diabetes mellitus with ketoacidosis without coma: Secondary | ICD-10-CM | POA: Insufficient documentation

## 2021-03-08 LAB — BASIC METABOLIC PANEL
Anion gap: >30 — ABNORMAL HIGH (ref 5–15)
BUN: 42 mg/dL — ABNORMAL HIGH (ref 6–20)
BUN: 42 mg/dL — ABNORMAL HIGH (ref 6–20)
CO2: 7 mmol/L — ABNORMAL LOW (ref 22–32)
CO2: <7 mmol/L — ABNORMAL LOW (ref 22–32)
Calcium: 9.7 mg/dL (ref 8.9–10.3)
Calcium: 9.4 mg/dL (ref 8.9–10.3)
Chloride: 88 mmol/L — ABNORMAL LOW (ref 98–111)
Chloride: 95 mmol/L — ABNORMAL LOW (ref 98–111)
Creatinine, Ser: 1.81 mg/dL — ABNORMAL HIGH (ref 0.44–1.00)
Creatinine, Ser: 1.52 mg/dL — ABNORMAL HIGH (ref 0.44–1.00)
GFR, Estimated: 37 mL/min — ABNORMAL LOW (ref >60)
GFR, Estimated: 45 mL/min — ABNORMAL LOW (ref >60)
Glucose, Bld: 978 mg/dL (ref 70–99)
Glucose, Bld: 904 mg/dL (ref 70–99)
Potassium: 6.8 mmol/L (ref 3.5–5.1)
Potassium: 5.8 mmol/L — ABNORMAL HIGH (ref 3.5–5.1)
Sodium: 128 mmol/L — ABNORMAL LOW (ref 135–145)
Sodium: 131 mmol/L — ABNORMAL LOW (ref 135–145)

## 2021-03-08 LAB — BLOOD GAS, VENOUS
Acid-base deficit: 22.4 mmol/L — ABNORMAL HIGH (ref 0.0–2.0)
Bicarbonate: 7.7 mmol/L — ABNORMAL LOW (ref 20.0–28.0)
FIO2: 21
O2 Saturation: 50.5 %
Patient temperature: 36.4
pCO2, Ven: 24.6 mmHg — ABNORMAL LOW (ref 44.0–60.0)
pH, Ven: 7.033 — CL (ref 7.250–7.430)
pO2, Ven: 36.3 mmHg (ref 32.0–45.0)

## 2021-03-08 LAB — GLUCOSE, CAPILLARY: Glucose-Capillary: >600 mg/dL (ref 70–99)

## 2021-03-08 LAB — URINALYSIS, ROUTINE W REFLEX MICROSCOPIC
Bacteria, UA: NONE SEEN
Bilirubin Urine: NEGATIVE
Glucose, UA: >=500 mg/dL — AB
Ketones, ur: 80 mg/dL — AB
Leukocytes,Ua: NEGATIVE
Nitrite: NEGATIVE
Protein, ur: 100 mg/dL — AB
Specific Gravity, Urine: 1.02 (ref 1.005–1.030)
pH: 5 (ref 5.0–8.0)

## 2021-03-08 LAB — CBC
HCT: 51 % — ABNORMAL HIGH (ref 36.0–46.0)
Hemoglobin: 14.9 g/dL (ref 12.0–15.0)
MCH: 28.5 pg (ref 26.0–34.0)
MCHC: 29.2 g/dL — ABNORMAL LOW (ref 30.0–36.0)
MCV: 97.5 fL (ref 80.0–100.0)
Platelets: 394 10*3/uL (ref 150–400)
RBC: 5.23 MIL/uL — ABNORMAL HIGH (ref 3.87–5.11)
RDW: 15.4 % (ref 11.5–15.5)
WBC: 29.2 10*3/uL — ABNORMAL HIGH (ref 4.0–10.5)
nRBC: 0 % (ref 0.0–0.2)

## 2021-03-08 LAB — TROPONIN I (HIGH SENSITIVITY)
Troponin I (High Sensitivity): 7 ng/L (ref <18)
Troponin I (High Sensitivity): 8 ng/L (ref <18)

## 2021-03-08 LAB — CBG MONITORING, ED: Glucose-Capillary: >600 mg/dL (ref 70–99)

## 2021-03-08 LAB — BRAIN NATRIURETIC PEPTIDE: B Natriuretic Peptide: 94 pg/mL (ref 0.0–100.0)

## 2021-03-08 LAB — BETA-HYDROXYBUTYRIC ACID: Beta-Hydroxybutyric Acid: >8 mmol/L — ABNORMAL HIGH (ref 0.05–0.27)

## 2021-03-08 MED ORDER — INSULIN REGULAR(HUMAN) IN NACL 100-0.9 UT/100ML-% IV SOLN
INTRAVENOUS | Status: DC
  Administered 2021-03-08 (×2): 4.8 [IU]/h via INTRAVENOUS
  Filled 2021-03-08: qty 100

## 2021-03-08 MED ORDER — STERILE WATER FOR INJECTION IV SOLN
INTRAVENOUS | Status: DC
  Filled 2021-03-08 (×2): qty 1000

## 2021-03-08 MED ORDER — ONDANSETRON HCL 4 MG/2ML IJ SOLN
4.0000 mg | Freq: Once | INTRAMUSCULAR | Status: AC
  Administered 2021-03-08: 4 mg via INTRAVENOUS

## 2021-03-08 MED ORDER — DEXTROSE 50 % IV SOLN: 0.0000 mL | INTRAVENOUS | Status: DC | PRN

## 2021-03-08 MED ORDER — SODIUM CHLORIDE 0.9 % IV SOLN
25.0000 mg | Freq: Four times a day (QID) | INTRAVENOUS | Status: DC | PRN
  Filled 2021-03-08: qty 1

## 2021-03-08 MED ORDER — LACTATED RINGERS IV BOLUS: 1000.0000 mL | Freq: Once | INTRAVENOUS | Status: DC

## 2021-03-08 MED ORDER — ONDANSETRON HCL 4 MG/2ML IJ SOLN
INTRAMUSCULAR | Status: AC
  Administered 2021-03-08: 4 mg via INTRAVENOUS
  Filled 2021-03-08: qty 2

## 2021-03-08 MED ORDER — LACTATED RINGERS IV SOLN: INTRAVENOUS | Status: DC

## 2021-03-08 MED ORDER — LACTATED RINGERS IV BOLUS
20.0000 mL/kg | Freq: Once | INTRAVENOUS | Status: AC
  Administered 2021-03-08: 1000 mL via INTRAVENOUS

## 2021-03-08 MED ORDER — SODIUM BICARBONATE 8.4 % IV SOLN
INTRAVENOUS | Status: AC
  Filled 2021-03-08: qty 150

## 2021-03-08 MED ORDER — INSULIN REGULAR(HUMAN) IN NACL 100-0.9 UT/100ML-% IV SOLN
INTRAVENOUS | Status: DC
Start: 2021-03-08 — End: 2021-03-08

## 2021-03-08 MED ORDER — ONDANSETRON HCL 4 MG/2ML IJ SOLN: 4.0000 mg | Freq: Once | INTRAMUSCULAR | Status: AC

## 2021-03-08 MED ORDER — SODIUM CHLORIDE 0.9 % IV SOLN: INTRAVENOUS | Status: DC

## 2021-03-08 MED ORDER — ALBUTEROL SULFATE HFA 108 (90 BASE) MCG/ACT IN AERS: 2.0000 | INHALATION_SPRAY | RESPIRATORY_TRACT | Status: DC | PRN

## 2021-03-08 MED ORDER — CALCIUM GLUCONATE-NACL 1-0.675 GM/50ML-% IV SOLN
1.0000 g | Freq: Once | INTRAVENOUS | Status: AC
  Administered 2021-03-08: 1000 mg via INTRAVENOUS
  Filled 2021-03-08: qty 50

## 2021-03-08 MED ORDER — HEPARIN SODIUM (PORCINE) 5000 UNIT/ML IJ SOLN: 5000.0000 [IU] | Freq: Three times a day (TID) | INTRAMUSCULAR | Status: DC

## 2021-03-08 MED ORDER — PROMETHAZINE HCL 25 MG/ML IJ SOLN
INTRAMUSCULAR | Status: AC
  Filled 2021-03-08: qty 1

## 2021-03-08 MED ORDER — DEXTROSE IN LACTATED RINGERS 5 % IV SOLN: INTRAVENOUS | Status: DC

## 2021-03-08 MED ORDER — FENTANYL CITRATE (PF) 100 MCG/2ML IJ SOLN
50.0000 ug | Freq: Once | INTRAMUSCULAR | Status: AC
  Administered 2021-03-08: 50 ug via INTRAVENOUS
  Filled 2021-03-08: qty 2

## 2021-03-08 MED ORDER — SODIUM CHLORIDE 0.9 % IV BOLUS
20.0000 mL/kg | Freq: Once | INTRAVENOUS | Status: AC
  Administered 2021-03-08: 1000 mL via INTRAVENOUS

## 2021-03-08 NOTE — ED Provider Notes (Signed)
[facility] EMERGENCY DEPARTMENT Provider Note  CSN: [id number] Arrival date & time: 03/08/21 1755    History Chief Complaint  Patient presents with  . Shortness of Breath    HPI  [name] is a 35 y.o. 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 No leg swelling. Glucose has been high but she didn't take any insulin because she wasn't eating due to nausea. She had an admission for MRSA sepsis, pneumonia, Covid and respiratory failure in Feb/March. And another admission for CHF later that month.    Past Medical History:  Diagnosis Date  . Acid reflux 2010  . CHF (congestive heart failure) (HCC)   . Diabetes mellitus   . DKA 05/01/2011  . Hypertension   . Polysubstance abuse [facility](HCC)     Past Surgical History:  Procedure Laterality Date  . TUBAL LIGATION    . WOUND EXPLORATION Left 07/26/2014   Procedure: Irrigation, debridement and exploration of elbow wounds;  Surgeon: Betha LoaKevin Kuzma, MD;  [facility];  Service: Orthopedics;  Laterality: Left;    Family History  Problem Relation Age of Onset  . Coronary artery disease Mother   . Lung cancer Mother   . Heart attack Father 4650  . Thyroid disease Maternal Grandmother   . Diabetes type I Daughter     Social History   Tobacco Use  . Smoking status: Current Every Day Smoker    Packs/day: 1.00    Years: 15.00    Pack years: 15.00    Types: Cigarettes  . Smokeless tobacco: Never Used  . Tobacco comment: She is currently trying to quit.  Vaping Use  . Vaping Use: Never used  Substance Use Topics  . Alcohol use: No  . Drug use: Yes    Types: Cocaine    Comment: 2-3 days ago     Home Medications Prior to Admission medications   Medication Sig Start Date End Date Taking? Authorizing Provider  carvedilol (COREG) 6.25 MG tablet Take 1 tablet (6.25 mg total) by mouth 2 (two) times daily  with a meal. Increased from 3.125 mg. 12/31/20 03/31/21  Darlin PriestlyLai, Tina, MD  furosemide (LASIX) 20 MG tablet Take 2 tablets (40 mg total) by mouth daily. Increased from 20 mg daily. 12/31/20 03/31/21  Darlin PriestlyLai, Tina, MD  insulin aspart (NOVOLOG FLEXPEN) 100 UNIT/ML FlexPen Inject 1-15 Units into the skin 3 (three) times daily with meals. Dose based on CBG and carb count, 1 unit for every 15 carb.    [provider]  insulin glargine (LANTUS SOLOSTAR) 100 UNIT/ML Solostar Pen Inject 30 Units into the skin daily at 10 pm. 12/13/20 02/11/21  Tat, Onalee Huaavid, MD  lisinopril (ZESTRIL) 5 MG tablet Take 1 tablet (5 mg total) by mouth daily. New medication for your heart. 01/01/21 04/01/21  Darlin PriestlyLai, Tina, MD  Multiple Vitamin (MULTIVITAMIN) tablet Take 1 tablet by mouth daily.    [provider]  omeprazole (PRILOSEC) 40 MG capsule Take 40 mg by mouth daily.    [provider]     Allergies    Codeine, Penicillins, and Tramadol   Review of Systems   Review of Systems A comprehensive review of systems was completed and negative except as noted in HPI.    Physical Exam BP 131/83   Pulse (!) 124   Temp 97.6 F (36.4 C) (Oral)   Resp 14   SpO2 100%  Physical Exam Vitals and nursing note reviewed.  Constitutional:      Appearance: Normal appearance.     Comments: cachectic  HENT:     Head: Normocephalic and atraumatic.     Nose: Nose normal.     Mouth/Throat:     Mouth: Mucous membranes are moist.  Eyes:     Extraocular Movements: Extraocular movements intact.     Conjunctiva/sclera: Conjunctivae normal.  Cardiovascular:     Rate and Rhythm: Normal rate.  Pulmonary:     Effort: Pulmonary effort is normal. Tachypnea present.     Breath sounds: Normal breath sounds. No wheezing, rhonchi or rales.  Abdominal:     General: Abdomen is flat.     Palpations: Abdomen is soft.     Tenderness: There is no abdominal tenderness.  Musculoskeletal:        General: No swelling. Normal range of  motion.     Cervical back: Neck supple.  Skin:    General: Skin is warm and dry.  Neurological:     General: No focal deficit present.     Mental Status: She is alert.  Psychiatric:        Mood and Affect: Mood normal.      ED Results / Procedures / Treatments   Labs (all labs ordered are listed, but only abnormal results are displayed) Labs Reviewed  GLUCOSE, CAPILLARY - Abnormal; Notable for the following components:      Result Value   Glucose-Capillary >600 (*)    All other components within normal limits  BASIC METABOLIC PANEL - Abnormal; Notable for the following components:   Sodium 128 (*)    Potassium 6.8 (*)    Chloride 88 (*)    CO2 7 (*)    Glucose, Bld 978 (*)    BUN 42 (*)    Creatinine, Ser 1.81 (*)    GFR, Estimated 37 (*)    Anion gap >30 (*)    All other components within normal limits  CBC - Abnormal; Notable for the following components:   WBC 29.2 (*)    RBC 5.23 (*)    HCT 51.0 (*)    MCHC 29.2 (*)    All other components within normal limits  URINALYSIS, ROUTINE W REFLEX MICROSCOPIC - Abnormal; Notable for the following components:   Color, Urine STRAW (*)    APPearance HAZY (*)    Glucose, UA >=500 (*)    Hgb urine dipstick SMALL (*)    Ketones, ur 80 (*)    Protein, ur 100 (*)    All other components within normal limits  BLOOD GAS, VENOUS - Abnormal; Notable for the following components:   pH, Ven 7.033 (*)    pCO2, Ven 24.6 (*)    Bicarbonate 7.7 (*)    Acid-base deficit 22.4 (*)    All other components within normal limits  BETA-HYDROXYBUTYRIC ACID - Abnormal; Notable for the following components:   Beta-Hydroxybutyric Acid >8.00 (*)    All other components within normal limits  BASIC METABOLIC PANEL - Abnormal; Notable for the following components:   Sodium 131 (*)    Potassium 5.8 (*)    Chloride 95 (*)    CO2 <7 (*)    Glucose, Bld 904 (*)    BUN 42 (*)    Creatinine, Ser 1.52 (*)    GFR, Estimated 45 (*)    All other  components within normal limits  CBG MONITORING, ED - Abnormal; Notable for the following components:  Glucose-Capillary >600 (*)    All other components within normal limits  BRAIN NATRIURETIC PEPTIDE  OCCULT BLOOD GASTRIC / DUODENUM (SPECIMEN CUP)  COMPREHENSIVE METABOLIC PANEL  CBC  PROTIME-INR  APTT  MAGNESIUM  PHOSPHORUS  CBG MONITORING, ED  CBG MONITORING, ED  POC URINE PREG, ED  TROPONIN I (HIGH SENSITIVITY)  TROPONIN I (HIGH SENSITIVITY)    EKG None  Radiology DG Chest 1 View  Result Date: 03/08/2021 CLINICAL DATA:  Central venous catheter placement EXAM: CHEST  1 VIEW COMPARISON:  03/08/2021 FINDINGS: Right internal jugular central venous catheter tip noted within the superior vena cava. Lungs are clear. No pneumothorax or pleural effusion. Cardiac size within normal limits. The pulmonary vascularity is normal. IMPRESSION: Right internal jugular central venous catheter tip within the superior vena cava. No pneumothorax. Electronically Signed   By: Helyn Numbers MD   On: 03/08/2021 22:39   DG Chest 2 View  Result Date: 03/08/2021 CLINICAL DATA:  Shortness of breath for 2 days, history hypertension, CHF, smoker; was COVID-19 positive with severe MRSA shock in February 2022 EXAM: CHEST - 2 VIEW COMPARISON:  12/28/2020 FINDINGS: Normal heart size, mediastinal contours, and pulmonary vascularity. Lungs slightly hyperinflated with small residual RIGHT pleural effusion. Extensive infiltrates seen on previous exam resolved. Remaining lungs clear. No pneumothorax or acute osseous findings. IMPRESSION: Mildly hyperinflated lungs with resolved pulmonary infiltrates and small residual RIGHT pleural effusion. Electronically Signed   By: Ulyses Southward M.D.   On: 03/08/2021 18:48    Procedures .Central Line  Date/Time: 03/08/2021 10:59 PM Performed by: Pollyann Savoy, MD Authorized by: Pollyann Savoy, MD   Consent:    Consent obtained:  Verbal   Consent given by:  Patient    Risks, benefits, and alternatives were discussed: yes     Risks discussed:  Arterial puncture, incorrect placement, bleeding, infection and pneumothorax   Alternatives discussed:  Alternative treatment Universal protocol:    Immediately prior to procedure, a time out was called: yes     Patient identity confirmed:  Arm band Pre-procedure details:    Indication(s): central venous access     Hand hygiene: Hand hygiene performed prior to insertion     Sterile barrier technique: All elements of maximal sterile technique followed     Skin preparation:  Chlorhexidine   Skin preparation agent: Skin preparation agent completely dried prior to procedure   Sedation:    Sedation type:  None Anesthesia:    Anesthesia method:  Local infiltration   Local anesthetic:  Lidocaine 2% w/o epi Procedure details:    Location:  R internal jugular   Patient position:  Supine   Procedural supplies:  Triple lumen   Catheter size:  7 Fr   Landmarks identified: yes     Ultrasound guidance: yes     Ultrasound guidance timing: real time     Sterile ultrasound techniques: Sterile gel and sterile probe covers were used     Number of attempts:  1   Successful placement: yes   Post-procedure details:    Post-procedure:  Dressing applied and line sutured   Assessment:  Blood return through all ports, free fluid flow and no pneumothorax on x-ray   Procedure completion:  Tolerated well, no immediate complications .Critical Care Performed by: Pollyann Savoy, MD Authorized by: Pollyann Savoy, MD   Critical care provider statement:    Critical care time (minutes):  60   Critical care time was exclusive of:  Separately billable procedures and treating  other patients   Critical care was necessary to treat or prevent imminent or life-threatening deterioration of the following conditions:  Metabolic crisis   Critical care was time spent personally by me on the following activities:  Discussions with  consultants, evaluation of patient's response to treatment, examination of patient, ordering and performing treatments and interventions, ordering and review of laboratory studies, ordering and review of radiographic studies, pulse oximetry, re-evaluation of patient's condition, obtaining history from patient or surrogate and review of old charts    Medications Ordered in the ED Medications  albuterol (VENTOLIN HFA) 108 (90 Base) MCG/ACT inhaler 2 puff (has no administration in time range)  insulin regular, human (MYXREDLIN) 100 units/ 100 mL infusion (4.8 Units/hr Intravenous New Bag/Given 03/08/21 2200)  lactated ringers infusion ( Intravenous Stopped 03/08/21 2321)  dextrose 5 % in lactated ringers infusion (0 mLs Intravenous Hold 03/08/21 2317)  dextrose 50 % solution 0-50 mL (has no administration in time range)  promethazine (PHENERGAN) 25 mg in sodium chloride 0.9 % 50 mL IVPB (has no administration in time range)  sodium bicarbonate 150 mEq in sterile water 1,150 mL infusion (has no administration in time range)  dextrose 50 % solution 0-50 mL (has no administration in time range)  0.9 %  sodium chloride infusion (has no administration in time range)  dextrose 5 % in lactated ringers infusion (has no administration in time range)  heparin injection 5,000 Units (has no administration in time range)  lactated ringers bolus 20 mL/kg (0 mL/kg Intravenous Stopped 03/08/21 2258)  ondansetron (ZOFRAN) injection 4 mg (4 mg Intravenous Given 03/08/21 2028)  calcium gluconate 1 g/ 50 mL sodium chloride IVPB (0 g Intravenous Stopped 03/08/21 2259)  ondansetron (ZOFRAN) injection 4 mg (4 mg Intravenous Given 03/08/21 2130)  fentaNYL (SUBLIMAZE) injection 50 mcg (50 mcg Intravenous Given 03/08/21 2315)  sodium chloride 0.9 % bolus 20 mL/kg (1,000 mLs Intravenous New Bag/Given 03/08/21 2318)     MDM Rules/Calculators/A&P MDM Patient initially seen in triage for MSE. Glucose is high. Suspect DKA vs CHF  vs pneumonia. Labs ordered, will give IVF when she is in an ED room.  ED Course  I have reviewed the triage vital signs and the nursing notes.  Pertinent labs & imaging results that were available during my care of the patient were reviewed by me and considered in my medical decision making (see chart for details).  Clinical Course as of 03/08/21 2338  Mon Mar 08, 2021  1854 CXR without significant pulm edema [CS]  1951 UA with glucosuria and ketones. No signs of infection.  [CS]  2013 CBC shows markedly elevated WBC.  [CS]  2018 Venous pH is low, will begin insulin drip for DKA.  [CS]  2055 BNP normal. Doubt CHF as the cause of her SOB.  [CS]  2128 BMP shows marked hyperglycemia, anion gap and acidosis. K is elevated, will give Calcium while using insulin drip for DKA to correct her acidosis.  [CS]  2300 Patient with poor peripheral access, CVL placed without difficulty. Hospitalist paged for admission.  [CS]  2313 Spoke with Dr. Thomes Dinning, Hospitalist, who will evaluate for admission. He requests IVF be changed to NS due to her hyperkalemia. Also requests bicarb drip for acidosis.  [CS]    Clinical Course User Index [CS] Pollyann Savoy, MD    Final Clinical Impression(s) / ED Diagnoses Final diagnoses:  Diabetic ketoacidosis without coma associated with type 1 diabetes mellitus (HCC)    Rx / DC Orders  ED Discharge Orders    None       Pollyann Savoy, MD 03/08/21 [phone]

## 2021-03-08 NOTE — H&P (Incomplete)
History and Physical  Breland Elders AYO:[id number] DOB: 06-24-1986 DOA: 03/08/2021  Referring physician: [name] Kitchen  PCP: [facility]  Outpatient Specialists: *** Patient coming from: *** & is able to ambulate ***  Chief Complaint: ***   HPI: Trinidad Ingle is a 35 y.o. female with medical history significant for *** (For level 3, the HPI must include 4+ descriptors: Location, Quality, Severity, Duration, Timing, Context, modifying factors, associated signs/symptoms and/or status of 3+ chronic problems.)   ED Course: ***  Review of Systems: Review of systems as noted in the HPI. All other systems reviewed and are negative.   Past Medical History:  Diagnosis Date  . Acid reflux 2010  . CHF (congestive heart failure) (HCC)   . Diabetes mellitus   . DKA 05/01/2011  . Hypertension   . Polysubstance abuse [facility])    Past Surgical History:  Procedure Laterality Date  . TUBAL LIGATION    . WOUND EXPLORATION Left 07/26/2014   Procedure: Irrigation, debridement and exploration of elbow wounds;  Surgeon: Betha Loa, MD;  Location: [facility] OR;  Service: Orthopedics;  Laterality: Left;    Social History:  reports that she has been smoking cigarettes. She has a 15.00 pack-year smoking history. She has never used smokeless tobacco. She reports current drug use. Drug: Cocaine. She reports that she does not drink alcohol.   Allergies  Allergen Reactions  . Codeine Anaphylaxis and Nausea And Vomiting  . Penicillins Rash    As a teenager  . Tramadol Itching    Family History  Problem Relation Age of Onset  . Coronary artery disease Mother   . Lung cancer Mother   . Heart attack Father 71  . Thyroid disease Maternal Grandmother   . Diabetes type I Daughter     ***  Prior to Admission medications   Medication Sig Start Date End Date Taking? Authorizing Provider  carvedilol (COREG) 6.25 MG tablet Take 1 tablet (6.25 mg total) by mouth 2 (two) times daily with a meal.  Increased from 3.125 mg. 12/31/20 03/31/21  Darlin Priestly, MD  furosemide (LASIX) 20 MG tablet Take 2 tablets (40 mg total) by mouth daily. Increased from 20 mg daily. 12/31/20 03/31/21  Darlin Priestly, MD  insulin aspart (NOVOLOG FLEXPEN) 100 UNIT/ML FlexPen Inject 1-15 Units into the skin 3 (three) times daily with meals. Dose based on CBG and carb count, 1 unit for every 15 carb.    [provider]  insulin glargine (LANTUS SOLOSTAR) 100 UNIT/ML Solostar Pen Inject 30 Units into the skin daily at 10 pm. 12/13/20 02/11/21  Tat, Onalee Hua, MD  lisinopril (ZESTRIL) 5 MG tablet Take 1 tablet (5 mg total) by mouth daily. New medication for your heart. 01/01/21 04/01/21  Darlin Priestly, MD  Multiple Vitamin (MULTIVITAMIN) tablet Take 1 tablet by mouth daily.    [provider]  omeprazole (PRILOSEC) 40 MG capsule Take 40 mg by mouth daily.    [provider]    Physical Exam: BP 131/83   Pulse (!) 124   Temp 97.6 F (36.4 C) (Oral)   Resp 14   SpO2 100%   . General: 35 y.o. year-old female well developed well nourished in no acute distress.  Alert and oriented x3. [name] Kitchen HEENT: NCAT, EOMI . Neck: Supple, trachea medial . Cardiovascular: Regular rate and rhythm with no rubs or gallops.  No thyromegaly or JVD noted.  No lower extremity edema. 2/4 pulses in all 4 extremities. [name] Kitchen Respiratory: Clear to auscultation with no wheezes or  rales. Good inspiratory effort. . Abdomen: Soft, nontender nondistended with normal bowel sounds x4 quadrants. . Muskuloskeletal: No cyanosis, clubbing or edema noted bilaterally . Neuro: CN II-XII intact, strength 5/5 x 4, sensation, reflexes intact . Skin: No ulcerative lesions noted or rashes . Psychiatry: Judgement and insight appear normal. Mood is appropriate for condition and setting          Labs on Admission:  Basic Metabolic Panel: Recent Labs  Lab 03/08/21 1951  NA 128*  K 6.8*  CL 88*  CO2 7*  GLUCOSE 978*  BUN 42*  CREATININE 1.81*  CALCIUM 9.7    Liver Function Tests: No results for input(s): AST, ALT, ALKPHOS, BILITOT, PROT, ALBUMIN in the last 168 hours. No results for input(s): LIPASE, AMYLASE in the last 168 hours. No results for input(s): AMMONIA in the last 168 hours. CBC: Recent Labs  Lab 03/08/21 1951  WBC 29.2*  HGB 14.9  HCT 51.0*  MCV 97.5  PLT 394   Cardiac Enzymes: No results for input(s): CKTOTAL, CKMB, CKMBINDEX, TROPONINI in the last 168 hours.  BNP (last 3 results) Recent Labs    12/28/20 0301 03/08/21 1951  BNP 359.6* 94.0    ProBNP (last 3 results) No results for input(s): PROBNP in the last 8760 hours.  CBG: Recent Labs  Lab 03/08/21 1810 03/08/21 2233  GLUCAP >600* >600*    Radiological Exams on Admission: DG Chest 1 View  Result Date: 03/08/2021 CLINICAL DATA:  Central venous catheter placement EXAM: CHEST  1 VIEW COMPARISON:  03/08/2021 FINDINGS: Right internal jugular central venous catheter tip noted within the superior vena cava. Lungs are clear. No pneumothorax or pleural effusion. Cardiac size within normal limits. The pulmonary vascularity is normal. IMPRESSION: Right internal jugular central venous catheter tip within the superior vena cava. No pneumothorax. Electronically Signed   By: Helyn Numbers MD   On: 03/08/2021 22:39   DG Chest 2 View  Result Date: 03/08/2021 CLINICAL DATA:  Shortness of breath for 2 days, history hypertension, CHF, smoker; was COVID-19 positive with severe MRSA shock in February 2022 EXAM: CHEST - 2 VIEW COMPARISON:  12/28/2020 FINDINGS: Normal heart size, mediastinal contours, and pulmonary vascularity. Lungs slightly hyperinflated with small residual RIGHT pleural effusion. Extensive infiltrates seen on previous exam resolved. Remaining lungs clear. No pneumothorax or acute osseous findings. IMPRESSION: Mildly hyperinflated lungs with resolved pulmonary infiltrates and small residual RIGHT pleural effusion. Electronically Signed   By: Ulyses Southward M.D.    On: 03/08/2021 18:48    EKG: I independently viewed the EKG done and my findings are as followed: ***   Assessment/Plan Present on Admission: . DKA (diabetic ketoacidosis) (HCC)  Active Problems:   DKA (diabetic ketoacidosis) (HCC)   DVT prophylaxis: ***   Code Status: ***   Family Communication: ***   Disposition Plan: ***   Consults called: ***   Admission status: ***     Frankey Shown MD [facility] Pager [phone]  If 7PM-7AM, please contact night-coverage [email or web address] Password [facility]  03/08/2021, 11:22 PM

## 2021-03-08 NOTE — ED Notes (Signed)
CRITICAL ph of 7.033  Reported to EDP and Primary RN at this time.

## 2021-03-08 NOTE — ED Triage Notes (Signed)
Shortness of breath for 2 days

## 2021-03-09 ENCOUNTER — Encounter (HOSPITAL_COMMUNITY): Payer: Self-pay | Admitting: Emergency Medicine

## 2021-03-09 ENCOUNTER — Inpatient Hospital Stay (HOSPITAL_COMMUNITY)
Admission: EM | Admit: 2021-03-09 | Discharge: 2021-03-12 | DRG: 637 | Disposition: A | Discharge status: Home / Self Care | Payer: Medicaid Other | Attending: Internal Medicine | Admitting: Internal Medicine

## 2021-03-09 ENCOUNTER — Other Ambulatory Visit: Payer: Self-pay

## 2021-03-09 DIAGNOSIS — F1410 Cocaine abuse, uncomplicated: Secondary | ICD-10-CM | POA: Diagnosis present

## 2021-03-09 DIAGNOSIS — Z8349 Family history of other endocrine, nutritional and metabolic diseases: Secondary | ICD-10-CM

## 2021-03-09 DIAGNOSIS — E860 Dehydration: Secondary | ICD-10-CM

## 2021-03-09 DIAGNOSIS — Z794 Long term (current) use of insulin: Secondary | ICD-10-CM

## 2021-03-09 DIAGNOSIS — Z8249 Family history of ischemic heart disease and other diseases of the circulatory system: Secondary | ICD-10-CM

## 2021-03-09 DIAGNOSIS — G9341 Metabolic encephalopathy: Secondary | ICD-10-CM

## 2021-03-09 DIAGNOSIS — K219 Gastro-esophageal reflux disease without esophagitis: Secondary | ICD-10-CM | POA: Diagnosis present

## 2021-03-09 DIAGNOSIS — Z885 Allergy status to narcotic agent status: Secondary | ICD-10-CM

## 2021-03-09 DIAGNOSIS — E875 Hyperkalemia: Secondary | ICD-10-CM | POA: Diagnosis present

## 2021-03-09 DIAGNOSIS — Z20822 Contact with and (suspected) exposure to covid-19: Secondary | ICD-10-CM | POA: Diagnosis present

## 2021-03-09 DIAGNOSIS — E1011 Type 1 diabetes mellitus with ketoacidosis with coma: Secondary | ICD-10-CM | POA: Diagnosis not present

## 2021-03-09 DIAGNOSIS — I509 Heart failure, unspecified: Secondary | ICD-10-CM

## 2021-03-09 DIAGNOSIS — Z5329 Procedure and treatment not carried out because of patient's decision for other reasons: Secondary | ICD-10-CM | POA: Diagnosis not present

## 2021-03-09 DIAGNOSIS — Z720 Tobacco use: Secondary | ICD-10-CM | POA: Diagnosis present

## 2021-03-09 DIAGNOSIS — F17210 Nicotine dependence, cigarettes, uncomplicated: Secondary | ICD-10-CM | POA: Diagnosis present

## 2021-03-09 DIAGNOSIS — E1010 Type 1 diabetes mellitus with ketoacidosis without coma: Principal | ICD-10-CM

## 2021-03-09 DIAGNOSIS — Z8616 Personal history of COVID-19: Secondary | ICD-10-CM | POA: Diagnosis not present

## 2021-03-09 DIAGNOSIS — Z880 Allergy status to penicillin: Secondary | ICD-10-CM | POA: Diagnosis not present

## 2021-03-09 DIAGNOSIS — Z801 Family history of malignant neoplasm of trachea, bronchus and lung: Secondary | ICD-10-CM | POA: Diagnosis not present

## 2021-03-09 DIAGNOSIS — Z833 Family history of diabetes mellitus: Secondary | ICD-10-CM

## 2021-03-09 DIAGNOSIS — E870 Hyperosmolality and hypernatremia: Secondary | ICD-10-CM | POA: Diagnosis not present

## 2021-03-09 DIAGNOSIS — Z9114 Patient's other noncompliance with medication regimen: Secondary | ICD-10-CM | POA: Diagnosis not present

## 2021-03-09 DIAGNOSIS — I428 Other cardiomyopathies: Secondary | ICD-10-CM | POA: Diagnosis present

## 2021-03-09 DIAGNOSIS — R112 Nausea with vomiting, unspecified: Secondary | ICD-10-CM | POA: Diagnosis present

## 2021-03-09 DIAGNOSIS — E1110 Type 2 diabetes mellitus with ketoacidosis without coma: Secondary | ICD-10-CM | POA: Diagnosis present

## 2021-03-09 DIAGNOSIS — I110 Hypertensive heart disease with heart failure: Secondary | ICD-10-CM | POA: Diagnosis present

## 2021-03-09 DIAGNOSIS — I5022 Chronic systolic (congestive) heart failure: Secondary | ICD-10-CM | POA: Diagnosis present

## 2021-03-09 DIAGNOSIS — F1910 Other psychoactive substance abuse, uncomplicated: Secondary | ICD-10-CM | POA: Diagnosis present

## 2021-03-09 LAB — I-STAT CHEM 8, ED
BUN: 32 mg/dL — ABNORMAL HIGH (ref 6–20)
Calcium, Ion: 1.22 mmol/L (ref 1.15–1.40)
Chloride: 111 mmol/L (ref 98–111)
Creatinine, Ser: 0.8 mg/dL (ref 0.44–1.00)
Glucose, Bld: 497 mg/dL — ABNORMAL HIGH (ref 70–99)
HCT: 41 % (ref 36.0–46.0)
Hemoglobin: 13.9 g/dL (ref 12.0–15.0)
Potassium: 5.5 mmol/L — ABNORMAL HIGH (ref 3.5–5.1)
Sodium: 138 mmol/L (ref 135–145)
TCO2: 9 mmol/L — ABNORMAL LOW (ref 22–32)

## 2021-03-09 LAB — BLOOD GAS, VENOUS
Acid-base deficit: 21 mmol/L — ABNORMAL HIGH (ref 0.0–2.0)
Acid-base deficit: 19.4 mmol/L — ABNORMAL HIGH (ref 0.0–2.0)
Acid-base deficit: 21 mmol/L — ABNORMAL HIGH (ref 0.0–2.0)
Bicarbonate: 9.5 mmol/L — ABNORMAL LOW (ref 20.0–28.0)
Bicarbonate: 9.4 mmol/L — ABNORMAL LOW (ref 20.0–28.0)
Bicarbonate: 9.8 mmol/L — ABNORMAL LOW (ref 20.0–28.0)
FIO2: 21
FIO2: 21
FIO2: 21
O2 Saturation: 92.3 %
O2 Saturation: 63.8 %
O2 Saturation: 89.3 %
Patient temperature: 37
Patient temperature: 37
Patient temperature: 37
pCO2, Ven: 17.3 mmHg — CL (ref 44.0–60.0)
pCO2, Ven: 23.5 mmHg — ABNORMAL LOW (ref 44.0–60.0)
pCO2, Ven: <19 mmHg — CL (ref 44.0–60.0)
pH, Ven: 7.173 — CL (ref 7.250–7.430)
pH, Ven: 7.147 — CL (ref 7.250–7.430)
pH, Ven: 7.162 — CL (ref 7.250–7.430)
pO2, Ven: 75.4 mmHg — ABNORMAL HIGH (ref 32.0–45.0)
pO2, Ven: 39.8 mmHg (ref 32.0–45.0)
pO2, Ven: 67.8 mmHg — ABNORMAL HIGH (ref 32.0–45.0)

## 2021-03-09 LAB — GLUCOSE, CAPILLARY
Glucose-Capillary: 163 mg/dL — ABNORMAL HIGH (ref 70–99)
Glucose-Capillary: 173 mg/dL — ABNORMAL HIGH (ref 70–99)
Glucose-Capillary: 184 mg/dL — ABNORMAL HIGH (ref 70–99)
Glucose-Capillary: 191 mg/dL — ABNORMAL HIGH (ref 70–99)
Glucose-Capillary: 193 mg/dL — ABNORMAL HIGH (ref 70–99)
Glucose-Capillary: 200 mg/dL — ABNORMAL HIGH (ref 70–99)
Glucose-Capillary: 225 mg/dL — ABNORMAL HIGH (ref 70–99)
Glucose-Capillary: 229 mg/dL — ABNORMAL HIGH (ref 70–99)
Glucose-Capillary: 245 mg/dL — ABNORMAL HIGH (ref 70–99)
Glucose-Capillary: 253 mg/dL — ABNORMAL HIGH (ref 70–99)
Glucose-Capillary: 258 mg/dL — ABNORMAL HIGH (ref 70–99)

## 2021-03-09 LAB — COMPREHENSIVE METABOLIC PANEL
ALT: 17 U/L (ref 0–44)
AST: 34 U/L (ref 15–41)
Albumin: 3.5 g/dL (ref 3.5–5.0)
Alkaline Phosphatase: 77 U/L (ref 38–126)
Anion gap: 25 — ABNORMAL HIGH (ref 5–15)
BUN: 34 mg/dL — ABNORMAL HIGH (ref 6–20)
CO2: 8 mmol/L — ABNORMAL LOW (ref 22–32)
Calcium: 9 mg/dL (ref 8.9–10.3)
Chloride: 104 mmol/L (ref 98–111)
Creatinine, Ser: 1.3 mg/dL — ABNORMAL HIGH (ref 0.44–1.00)
GFR, Estimated: 54 mL/min — ABNORMAL LOW (ref >60)
Glucose, Bld: 509 mg/dL (ref 70–99)
Potassium: 5.4 mmol/L — ABNORMAL HIGH (ref 3.5–5.1)
Sodium: 137 mmol/L (ref 135–145)
Total Bilirubin: 1.9 mg/dL — ABNORMAL HIGH (ref 0.3–1.2)
Total Protein: 6.7 g/dL (ref 6.5–8.1)

## 2021-03-09 LAB — BLOOD GAS, ARTERIAL
Acid-base deficit: 22.6 mmol/L — ABNORMAL HIGH (ref 0.0–2.0)
Bicarbonate: 8.6 mmol/L — ABNORMAL LOW (ref 20.0–28.0)
FIO2: 21
O2 Saturation: 92.8 %
Patient temperature: 37
pCO2 arterial: <19 mmHg — CL (ref 32.0–48.0)
pH, Arterial: 7.153 — CL (ref 7.350–7.450)
pO2, Arterial: 82.8 mmHg — ABNORMAL LOW (ref 83.0–108.0)

## 2021-03-09 LAB — BASIC METABOLIC PANEL
Anion gap: 19 — ABNORMAL HIGH (ref 5–15)
Anion gap: 6 (ref 5–15)
Anion gap: 5 (ref 5–15)
BUN: 31 mg/dL — ABNORMAL HIGH (ref 6–20)
BUN: 22 mg/dL — ABNORMAL HIGH (ref 6–20)
BUN: 19 mg/dL (ref 6–20)
CO2: 10 mmol/L — ABNORMAL LOW (ref 22–32)
CO2: 24 mmol/L (ref 22–32)
CO2: 26 mmol/L (ref 22–32)
Calcium: 8.7 mg/dL — ABNORMAL LOW (ref 8.9–10.3)
Calcium: 8.4 mg/dL — ABNORMAL LOW (ref 8.9–10.3)
Calcium: 8.7 mg/dL — ABNORMAL LOW (ref 8.9–10.3)
Chloride: 111 mmol/L (ref 98–111)
Chloride: 111 mmol/L (ref 98–111)
Chloride: 113 mmol/L — ABNORMAL HIGH (ref 98–111)
Creatinine, Ser: 1.18 mg/dL — ABNORMAL HIGH (ref 0.44–1.00)
Creatinine, Ser: 0.75 mg/dL (ref 0.44–1.00)
Creatinine, Ser: 0.69 mg/dL (ref 0.44–1.00)
GFR, Estimated: >60 mL/min (ref >60)
GFR, Estimated: >60 mL/min (ref >60)
GFR, Estimated: >60 mL/min (ref >60)
Glucose, Bld: 355 mg/dL — ABNORMAL HIGH (ref 70–99)
Glucose, Bld: 231 mg/dL — ABNORMAL HIGH (ref 70–99)
Glucose, Bld: 196 mg/dL — ABNORMAL HIGH (ref 70–99)
Potassium: 4.3 mmol/L (ref 3.5–5.1)
Potassium: 3.9 mmol/L (ref 3.5–5.1)
Potassium: 3.7 mmol/L (ref 3.5–5.1)
Sodium: 140 mmol/L (ref 135–145)
Sodium: 141 mmol/L (ref 135–145)
Sodium: 144 mmol/L (ref 135–145)

## 2021-03-09 LAB — CBC
HCT: 41 % (ref 36.0–46.0)
Hemoglobin: 13.1 g/dL (ref 12.0–15.0)
MCH: 28.9 pg (ref 26.0–34.0)
MCHC: 32 g/dL (ref 30.0–36.0)
MCV: 90.5 fL (ref 80.0–100.0)
Platelets: 305 10*3/uL (ref 150–400)
RBC: 4.53 MIL/uL (ref 3.87–5.11)
RDW: 14.9 % (ref 11.5–15.5)
WBC: 28.9 10*3/uL — ABNORMAL HIGH (ref 4.0–10.5)
nRBC: 0 % (ref 0.0–0.2)

## 2021-03-09 LAB — PROTIME-INR
INR: 1.2 (ref 0.8–1.2)
Prothrombin Time: 14.8 seconds (ref 11.4–15.2)

## 2021-03-09 LAB — BETA-HYDROXYBUTYRIC ACID
Beta-Hydroxybutyric Acid: >8 mmol/L — ABNORMAL HIGH (ref 0.05–0.27)
Beta-Hydroxybutyric Acid: 1.21 mmol/L — ABNORMAL HIGH (ref 0.05–0.27)

## 2021-03-09 LAB — MRSA PCR SCREENING: MRSA by PCR: POSITIVE — AB

## 2021-03-09 LAB — CBG MONITORING, ED
Glucose-Capillary: 453 mg/dL — ABNORMAL HIGH (ref 70–99)
Glucose-Capillary: 445 mg/dL — ABNORMAL HIGH (ref 70–99)
Glucose-Capillary: 406 mg/dL — ABNORMAL HIGH (ref 70–99)
Glucose-Capillary: 432 mg/dL — ABNORMAL HIGH (ref 70–99)
Glucose-Capillary: 339 mg/dL — ABNORMAL HIGH (ref 70–99)
Glucose-Capillary: 284 mg/dL — ABNORMAL HIGH (ref 70–99)
Glucose-Capillary: 242 mg/dL — ABNORMAL HIGH (ref 70–99)

## 2021-03-09 LAB — PHOSPHORUS: Phosphorus: 3.2 mg/dL (ref 2.5–4.6)

## 2021-03-09 LAB — APTT: aPTT: 23 seconds — ABNORMAL LOW (ref 24–36)

## 2021-03-09 LAB — MAGNESIUM: Magnesium: 1.9 mg/dL (ref 1.7–2.4)

## 2021-03-09 LAB — RESP PANEL BY RT-PCR (FLU A&B, COVID) ARPGX2
Influenza A by PCR: NEGATIVE
Influenza B by PCR: NEGATIVE
SARS Coronavirus 2 by RT PCR: NEGATIVE

## 2021-03-09 MED ORDER — LACTATED RINGERS IV SOLN: INTRAVENOUS | Status: DC

## 2021-03-09 MED ORDER — CHLORHEXIDINE GLUCONATE CLOTH 2 % EX PADS
6.0000 | MEDICATED_PAD | Freq: Every day | CUTANEOUS | Status: DC
  Administered 2021-03-09 – 2021-03-12 (×4): 6 via TOPICAL

## 2021-03-09 MED ORDER — PANTOPRAZOLE SODIUM 40 MG IV SOLR
40.0000 mg | INTRAVENOUS | Status: DC
  Administered 2021-03-09 – 2021-03-12 (×4): 40 mg via INTRAVENOUS
  Filled 2021-03-09 (×4): qty 40

## 2021-03-09 MED ORDER — PROCHLORPERAZINE EDISYLATE 10 MG/2ML IJ SOLN
10.0000 mg | Freq: Four times a day (QID) | INTRAMUSCULAR | Status: DC | PRN
  Administered 2021-03-09 – 2021-03-10 (×2): 10 mg via INTRAVENOUS
  Filled 2021-03-09 (×2): qty 2

## 2021-03-09 MED ORDER — ENOXAPARIN SODIUM 30 MG/0.3ML IJ SOSY
30.0000 mg | PREFILLED_SYRINGE | INTRAMUSCULAR | Status: DC
  Administered 2021-03-09 – 2021-03-11 (×3): 30 mg via SUBCUTANEOUS
  Filled 2021-03-09 (×3): qty 0.3

## 2021-03-09 MED ORDER — DEXTROSE 50 % IV SOLN: 0.0000 mL | INTRAVENOUS | Status: DC | PRN

## 2021-03-09 MED ORDER — LACTATED RINGERS IV BOLUS
20.0000 mL/kg | Freq: Once | INTRAVENOUS | Status: AC
  Administered 2021-03-09: 862 mL via INTRAVENOUS

## 2021-03-09 MED ORDER — ONDANSETRON HCL 4 MG/2ML IJ SOLN
4.0000 mg | Freq: Four times a day (QID) | INTRAMUSCULAR | Status: DC | PRN
  Administered 2021-03-09 – 2021-03-12 (×6): 4 mg via INTRAVENOUS
  Filled 2021-03-09 (×7): qty 2

## 2021-03-09 MED ORDER — MUPIROCIN 2 % EX OINT
TOPICAL_OINTMENT | Freq: Two times a day (BID) | CUTANEOUS | Status: DC
  Administered 2021-03-10: 1 via NASAL
  Filled 2021-03-09: qty 22

## 2021-03-09 MED ORDER — DEXTROSE IN LACTATED RINGERS 5 % IV SOLN: INTRAVENOUS | Status: DC

## 2021-03-09 MED ORDER — INSULIN REGULAR(HUMAN) IN NACL 100-0.9 UT/100ML-% IV SOLN
INTRAVENOUS | Status: DC
  Administered 2021-03-09: 5 [IU]/h via INTRAVENOUS
  Filled 2021-03-09: qty 100

## 2021-03-09 MED ORDER — ONDANSETRON HCL 4 MG/2ML IJ SOLN
4.0000 mg | Freq: Once | INTRAMUSCULAR | Status: AC
  Administered 2021-03-09: 4 mg via INTRAVENOUS
  Filled 2021-03-09: qty 2

## 2021-03-09 NOTE — Progress Notes (Signed)
Inpatient Diabetes Program Recommendations  AACE/ADA: New Consensus Statement on Inpatient Glycemic Control (2015)  Target Ranges:  Prepandial:   less than 140 mg/dL      Peak postprandial:   less than 180 mg/dL (1-2 hours)      Critically ill patients:  140 - 180 mg/dL   Lab Results  Component Value Date   GLUCAP 242 (H) 03/09/2021   HGBA1C 14.7 (H) 11/30/2020    Review of Glycemic Control  Diabetes history: DM1 Outpatient Diabetes medications: Lantus 30 units QHS, Novolog 1-15 TID with meals, CHO ratio 1:15 Current orders for Inpatient glycemic control: IV insulin per [employer] for DKA  HgbA1C - 14.7% on 11/30/20  Inpatient Diabetes Program Recommendations:     Continue with IV insulin until criteria met for transition. When ready to start SQ insulin, give Lantus 25 units 2H prior to discontinuation of drip. Novolog 0-9 units Q4 x 12H, then TID with meals and 0-5 HS. Add Novolog 4 units TID with meals if eating > 50% meal  Diabetes Coordinators have spoken to pt numerous times about importance of controlling blood sugars at home and taking insulin as prescribed to reduce risk of long and short-term complications.   Will follow closely.  Thank you. Lorenda Peck, RD, LDN, CDE Inpatient Diabetes Coordinator [phone]

## 2021-03-09 NOTE — H&P (Signed)
History and Physical  [name] HKV:[id number] DOB: [birth year] DOA: 03/09/2021   Referring physician: Zadie Rhine, MD PCP: [facility]  Patient coming from: Home  Chief Complaint: Shortness of breath  HPI: [name] is a 35 y.o. 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  Patient was admitted from 2/14 - 2/27 due to renal failure secondary to COVID-19 and superimposed bacterial pneumonia with MRSA sepsis. Patient was started on DKA protocol after central line was already placed, but she left AMA only to return within 1 to 2 hours.  ED Course:  In the emergency department, she was tachycardic, but otherwise vitals are within normal range.  Work-up in the ED showed hyponatremia hyperkalemia, hypochloremia, bicarb less than 7, BUN 42, creatinine 1.52, anion gap > 30.  Blood Venous showed pH of 7.033 and bicarb of 7.7.  Beta hydroxybutyric acid > 8.0.  Urinalysis was positive for glycosuria, proteinuria and ketonuria.  Influenza A, B and SARS coronavirus 2 was negative. Chest x-ray showed mildly hyperinflated lungs with resolved pulmonary infiltrates and small residual right pleural effusion. She was started on DKA protocol.  Hospitalist was asked to admit patient for further evaluation and management.  Review of Systems:  Constitutional: Negative for chills and fever.  HENT: Negative for ear pain and sore throat.   Eyes: Negative for pain and visual disturbance.  Respiratory: Positive for shortness of breath.  Negative for cough, chest tightness   Cardiovascular: Positive for chest pain (reproducible).  Negative for palpitations.  Gastrointestinal: Positive for abdominal pain and  vomiting.  Endocrine: Negative for polyphagia and polyuria.  Genitourinary: Negative for decreased urine volume, dysuria, enuresis Musculoskeletal: Negative for arthralgias and back pain.  Skin: Negative for color change and rash.  Allergic/Immunologic: Negative for immunocompromised state.  Neurological: Negative for tremors, syncope, speech difficulty, weakness, light-headedness and headaches.  Hematological: Does not bruise/bleed easily.  All other systems reviewed and are negative   Past Medical History:  Diagnosis Date  . Acid reflux 2010  . CHF (congestive heart failure) (HCC)   . Diabetes mellitus   . DKA 05/01/2011  . Hypertension   . Polysubstance abuse [facility])    Past Surgical History:  Procedure Laterality Date  . TUBAL LIGATION    . WOUND EXPLORATION Left 07/26/2014   Procedure: Irrigation, debridement and exploration of elbow wounds;  Surgeon: Betha Loa, MD;  Location: [facility] OR;  Service: Orthopedics;  Laterality: Left;    Social History:  reports that she has been smoking cigarettes. She has a 15.00 pack-year smoking history. She has never used smokeless tobacco. She reports current drug use. Drug: Cocaine. She reports that she does not drink alcohol.   Allergies  Allergen Reactions  . Codeine Anaphylaxis and Nausea And Vomiting  . Penicillins Rash    As a teenager  . Tramadol Itching    Family History  Problem Relation Age of Onset  . Coronary artery disease Mother   . Lung cancer Mother   . Heart attack Father 23  . Thyroid disease Maternal Grandmother   . Diabetes type I Daughter      Prior to Admission medications   Medication Sig Start Date End Date Taking? Authorizing Provider  carvedilol (COREG) 6.25 MG tablet Take  1 tablet (6.25 mg total) by mouth 2 (two) times daily with a meal. Increased from 3.125 mg. 12/31/20 03/31/21  Darlin Priestly, MD  furosemide (LASIX) 20 MG tablet Take 2 tablets (40 mg total) by mouth daily. Increased from 20 mg daily. 12/31/20  03/31/21  Darlin Priestly, MD  insulin aspart (NOVOLOG FLEXPEN) 100 UNIT/ML FlexPen Inject 1-15 Units into the skin 3 (three) times daily with meals. Dose based on CBG and carb count, 1 unit for every 15 carb.    [provider]  insulin glargine (LANTUS SOLOSTAR) 100 UNIT/ML Solostar Pen Inject 30 Units into the skin daily at 10 pm. 12/13/20 02/11/21  Tat, Onalee Hua, MD  lisinopril (ZESTRIL) 5 MG tablet Take 1 tablet (5 mg total) by mouth daily. New medication for your heart. 01/01/21 04/01/21  Darlin Priestly, MD  Multiple Vitamin (MULTIVITAMIN) tablet Take 1 tablet by mouth daily.    [provider]  omeprazole (PRILOSEC) 40 MG capsule Take 40 mg by mouth daily.    [provider]    Physical Exam: BP 133/80   Pulse (!) 121   Temp 97.8 F (36.6 C) (Oral)   Resp 20   Ht 5' (1.524 m)   Wt 43.1 kg   SpO2 94%   BMI 18.56 kg/m   . General: 35 y.o. year-old female cachectic in no acute distress.  Patient was in a four-point restraint . HEENT: NCAT, EOMI . Neck: Supple, trachea medial . Cardiovascular: Tachycardia.  Regular rate and rhythm with no rubs or gallops.  No thyromegaly or JVD noted.  No lower extremity edema. 2/4 pulses in all 4 extremities. [name] Kitchen Respiratory: Clear to auscultation with no wheezes or rales. Good inspiratory effort. . Abdomen: Soft, tender to palpation with guarding.  Nondistended with normal bowel sounds x4 quadrants. . Muskuloskeletal: No cyanosis, clubbing or edema noted bilaterally . Neuro: sensation intact, no focal neurologic deficit . Skin: No ulcerative lesions noted or rashes . Psychiatry: Mood is appropriate for condition and setting          Labs on Admission:  Basic Metabolic Panel: Recent Labs  Lab 03/08/21 1951 03/08/21 2255 03/09/21 0431 03/09/21 0539  NA 128* 131* 137 138  K 6.8* 5.8* 5.4* 5.5*  CL 88* 95* 104 111  CO2 7* <7* 8*  --   GLUCOSE 978* 904* 509* 497*  BUN 42* 42* 34* 32*  CREATININE 1.81* 1.52* 1.30* 0.80  CALCIUM  9.7 9.4 9.0  --   MG  --   --  1.9  --   PHOS  --   --  3.2  --    Liver Function Tests: Recent Labs  Lab 03/09/21 0431  AST 34  ALT 17  ALKPHOS 77  BILITOT 1.9*  PROT 6.7  ALBUMIN 3.5   No results for input(s): LIPASE, AMYLASE in the last 168 hours. No results for input(s): AMMONIA in the last 168 hours. CBC: Recent Labs  Lab 03/08/21 1951 03/09/21 0431 03/09/21 0539  WBC 29.2* 28.9*  --   HGB 14.9 13.1 13.9  HCT 51.0* 41.0 41.0  MCV 97.5 90.5  --   PLT 394 305  --    Cardiac Enzymes: No results for input(s): CKTOTAL, CKMB, CKMBINDEX, TROPONINI in the last 168 hours.  BNP (last 3 results) Recent Labs    12/28/20 0301 03/08/21 1951  BNP 359.6* 94.0    ProBNP (last 3 results) No results for input(s): PROBNP in the last 8760 hours.  CBG: Recent Labs  Lab 03/08/21  1810 03/08/21 2233 03/09/21 0535  GLUCAP >600* >600* 453*    Radiological Exams on Admission: DG Chest 1 View  Result Date: 03/08/2021 CLINICAL DATA:  Central venous catheter placement EXAM: CHEST  1 VIEW COMPARISON:  03/08/2021 FINDINGS: Right internal jugular central venous catheter tip noted within the superior vena cava. Lungs are clear. No pneumothorax or pleural effusion. Cardiac size within normal limits. The pulmonary vascularity is normal. IMPRESSION: Right internal jugular central venous catheter tip within the superior vena cava. No pneumothorax. Electronically Signed   By: Helyn Numbers MD   On: 03/08/2021 22:39   DG Chest 2 View  Result Date: 03/08/2021 CLINICAL DATA:  Shortness of breath for 2 days, history hypertension, CHF, smoker; was COVID-19 positive with severe MRSA shock in February 2022 EXAM: CHEST - 2 VIEW COMPARISON:  12/28/2020 FINDINGS: Normal heart size, mediastinal contours, and pulmonary vascularity. Lungs slightly hyperinflated with small residual RIGHT pleural effusion. Extensive infiltrates seen on previous exam resolved. Remaining lungs clear. No pneumothorax or acute  osseous findings. IMPRESSION: Mildly hyperinflated lungs with resolved pulmonary infiltrates and small residual RIGHT pleural effusion. Electronically Signed   By: Ulyses Southward M.D.   On: 03/08/2021 18:48    EKG: I independently viewed the EKG done and my findings are as followed: Sinus tachycardia at a rate of 114 bpm  Assessment/Plan Present on Admission: . DKA (diabetic ketoacidosis) (HCC) . Nausea & vomiting . Substance abuse (HCC) . GERD (gastroesophageal reflux disease) . Tobacco abuse  Active Problems:   Nausea & vomiting   Substance abuse (HCC)   Tobacco abuse   GERD (gastroesophageal reflux disease)   Congestive heart failure (CHF) (HCC)   DKA (diabetic ketoacidosis) (HCC)   Dehydration   Hyperkalemia  Anion gap metabolic acidosis secondary to DKA Hyperglycemia secondary to poorly controlled type 1 diabetes mellitus Continue insulin drip, IV LR with IV potassium per DKA protocol Transition IV LR to D5 LR when serum glucose reaches 250mg /dL Continue serial BMP and VBG Continue to monitor for anion gap closure prior to transitioning patient to subcu insulin Continue NPO  Nausea and vomiting Continue IV Zofran 4 mg every 6 hours as needed  Dehydration Continue IV hydration  Hyperkalemia due to DKA K+ 6.8 > 5.4, continue insulin drip  GERD  Continue Protonix  History of congestive heart failure Echocardiogram done on 11/23/2020 showed an EF of 30 to 35%.  LV has moderately decreased function and demonstrated RWMA.  RV systolic function is moderately reduced Continue Lasix, Coreg and lisinopril when patient resumes oral intake  DVT prophylaxis: Lovenox  Code Status: Full code  Family Communication: None at bedside  Disposition Plan:  Patient is from:                        home Anticipated DC to:                   home Anticipated DC date:               2-3 days Anticipated DC barriers:           Patient is unstable to be discharged at this time due to DKA  requiring inpatient management    Consults called: None  Admission status: Inpatient    01/21/2021 MD [facility]  03/09/2021, 6:39 AM

## 2021-03-09 NOTE — Progress Notes (Signed)
12:20 AM RN called to speak to patient who wanted to leave AMA.  At bedside, patient was alert and oriented x 3.  It was explained to her that she was in DKA with a blood glucose of 904, bicarb <and uncalculable anion gap.  Blood gas venous showed pH of 7.033, bicarb 7.7, beta hydroxybutyric > 8.0.  Patient was told that it was not safe for her to leave at this time and that she should wait to complete her treatment due to risk of going into diabetic coma with possible death.  She agreed to stay for treatment. Few minutes later, RN called and states that patient went to the restroom and was caught vaping, when confronted, patient demanded for central line to be removed and she left AMA. Patient's admission was canceled at this time.

## 2021-03-09 NOTE — ED Notes (Signed)
This writer went into the patients room to receive CBG. Pt's CBG is 242, RN Mary made aware. Pt then stated they had som chest pain. This Clinical research associate also notified RN about this. EKG done. Will continue to monitor pt.

## 2021-03-09 NOTE — ED Provider Notes (Addendum)
[facility] EMERGENCY DEPARTMENT Provider Note   CSN: [id number] Arrival date & time: 03/09/21  0149     History Chief Complaint - weakness  Level 5 caveat due to acuity of condition [name] is a 35 y.o. female.  The history is provided by the patient. The history is limited by the condition of the patient.  Weakness Severity:  Severe Onset quality:  Gradual Timing:  Constant Progression:  Worsening Chronicity:  Recurrent Context comment:  Diabetes Relieved by:  Nothing Worsened by:  Nothing Patient with history of diabetes, polysubstance abuse, hypertension presents for repeat ER visit.  Patient was just in the emerge department but left against advice.  She is reporting diffuse body pain, shortness of breath and generalized weakness. No other details are known on arrival     Past Medical History:  Diagnosis Date  . Acid reflux 2010  . CHF (congestive heart failure) (HCC)   . Diabetes mellitus   . DKA 05/01/2011  . Hypertension   . Polysubstance abuse [facility])     Patient Active Problem List   Diagnosis Date Noted  . DKA (diabetic ketoacidosis) (HCC) 03/08/2021  . Acute CHF (congestive heart failure) (HCC) 12/28/2020  . Systolic CHF, acute ----EF 30 to 35 % 12/03/2020  . 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 Pneumonia due to COVID-19 virus 12/01/2020  . Acute Hypoxic respiratory failure due to COVID-19 (HCC) 12/01/2020  . Sepsis secondary to community-acquired pneumonia with parapneumonic effusion 12/01/2020  . CAP (community acquired pneumonia)--- with parapneumonic effusion/MRSA Empyema 12/01/2020  . Severe MRSA sepsis with septic shock -secondary to community-acquired pneumonia with parapneumonic effusion/Empyema 12/01/2020  . DKA, type 1 (HCC) 11/30/2020  . Hyperosmolar hyperglycemic state (HHS) (HCC) 04/06/2020  . Generalized abdominal pain   . Cocaine abuse (HCC)   . Pyelonephritis 03/08/2019  . Abscess   . Systolic murmur   .  Hyperglycemia due to type 1 diabetes mellitus (HCC) 10/28/2018  . Substance abuse (HCC) 09/15/2018  . Tobacco abuse 09/15/2018  . Cough 09/15/2018  . Protein-calorie malnutrition, severe (HCC) 09/15/2018  . GERD (gastroesophageal reflux disease) 09/15/2018  . Nausea & vomiting   . Acute lower UTI 07/14/2016  . Panic disorder without agoraphobia with moderate panic attacks 01/20/2016  . Benzodiazepine abuse (HCC) 01/20/2016  . Substance induced mood disorder (HCC) 01/19/2016  . DKA 05/01/2011  . Type 1 diabetes mellitus with other specified complication (HCC) 05/01/2011  . Leukocytosis 05/01/2011    Past Surgical History:  Procedure Laterality Date  . TUBAL LIGATION    . WOUND EXPLORATION Left 07/26/2014   Procedure: Irrigation, debridement and exploration of elbow wounds;  Surgeon: Betha Loa, MD;  Location: [facility] OR;  Service: Orthopedics;  Laterality: Left;     OB History    Gravida  2   Para  2   Term      Preterm      AB      Living        SAB      IAB      Ectopic      Multiple      Live Births              Family History  Problem Relation Age of Onset  . Coronary artery disease Mother   . Lung cancer Mother   . Heart attack Father 38  . Thyroid disease Maternal Grandmother   . Diabetes type I Daughter     Social History  Tobacco Use  . Smoking status: Current Every Day Smoker    Packs/day: 1.00    Years: 15.00    Pack years: 15.00    Types: Cigarettes  . Smokeless tobacco: Never Used  . Tobacco comment: She is currently trying to quit.  Vaping Use  . Vaping Use: Never used  Substance Use Topics  . Alcohol use: No  . Drug use: Yes    Types: Cocaine    Comment: 2-3 days ago    Home Medications Prior to Admission medications   Medication Sig Start Date End Date Taking? Authorizing Provider  carvedilol (COREG) 6.25 MG tablet Take 1 tablet (6.25 mg total) by mouth 2 (two) times daily with a meal. Increased from 3.125 mg. 12/31/20  03/31/21  Darlin Priestly, MD  furosemide (LASIX) 20 MG tablet Take 2 tablets (40 mg total) by mouth daily. Increased from 20 mg daily. 12/31/20 03/31/21  Darlin Priestly, MD  insulin aspart (NOVOLOG FLEXPEN) 100 UNIT/ML FlexPen Inject 1-15 Units into the skin 3 (three) times daily with meals. Dose based on CBG and carb count, 1 unit for every 15 carb.    [provider]  insulin glargine (LANTUS SOLOSTAR) 100 UNIT/ML Solostar Pen Inject 30 Units into the skin daily at 10 pm. 12/13/20 02/11/21  Tat, Onalee Hua, MD  lisinopril (ZESTRIL) 5 MG tablet Take 1 tablet (5 mg total) by mouth daily. New medication for your heart. 01/01/21 04/01/21  Darlin Priestly, MD  Multiple Vitamin (MULTIVITAMIN) tablet Take 1 tablet by mouth daily.    [provider]  omeprazole (PRILOSEC) 40 MG capsule Take 40 mg by mouth daily.    [provider]    Allergies    Codeine, Penicillins, and Tramadol  Review of Systems   Review of Systems  Unable to perform ROS: Acuity of condition  Neurological: Positive for weakness.    Physical Exam Updated Vital Signs BP 133/82   Pulse (!) 121   Temp 97.8 F (36.6 C) (Oral)   Resp 19   Ht 1.524 m (5')   Wt 43.1 kg   SpO2 94%   BMI 18.56 kg/m   Physical Exam CONSTITUTIONAL: Disheveled, cachectic, ill-appearing smells of ketones HEAD: Normocephalic/atraumatic EYES: EOMI/PERRL ENMT: Mucous membranes dry NECK: supple no meningeal signs CV: S1/S2 noted, no loud murmurs, tachycardic LUNGS: Tachypnea, clear lungs bilaterally ABDOMEN: soft, nontender GU:no cva tenderness NEURO: Pt is somnolent but arousable, moves all extremities x4 EXTREMITIES: pulses normal/equal, full ROM, no deformities SKIN: warm, color normal PSYCH: Unable to assess  ED Results / Procedures / Treatments   Labs (all labs ordered are listed, but only abnormal results are displayed) Labs Reviewed  BLOOD GAS, VENOUS - Abnormal; Notable for the following components:      Result Value   pH, Ven  7.173 (*)    pCO2, Ven 17.3 (*)    pO2, Ven 75.4 (*)    Bicarbonate 9.5 (*)    Acid-base deficit 21.0 (*)    All other components within normal limits  BLOOD GAS, VENOUS - Abnormal; Notable for the following components:   pH, Ven 7.162 (*)    pCO2, Ven <19.0 (*)    pO2, Ven 67.8 (*)    Bicarbonate 9.4 (*)    Acid-base deficit 21.0 (*)    All other components within normal limits  BLOOD GAS, VENOUS - Abnormal; Notable for the following components:   pH, Ven 7.147 (*)    pCO2, Ven 23.5 (*)    Bicarbonate 9.8 (*)  Acid-base deficit 19.4 (*)    All other components within normal limits  BLOOD GAS, ARTERIAL - Abnormal; Notable for the following components:   pH, Arterial 7.153 (*)    pCO2 arterial <19.0 (*)    pO2, Arterial 82.8 (*)    Bicarbonate 8.6 (*)    Acid-base deficit 22.6 (*)    Allens test (pass/fail) BRACHIAL ARTERY (*)    All other components within normal limits  I-STAT CHEM 8, ED - Abnormal; Notable for the following components:   Potassium 5.5 (*)    BUN 32 (*)    Glucose, Bld 497 (*)    TCO2 9 (*)    All other components within normal limits  CBG MONITORING, ED - Abnormal; Notable for the following components:   Glucose-Capillary 453 (*)    All other components within normal limits  RESP PANEL BY RT-PCR (FLU A&B, COVID) ARPGX2  BASIC METABOLIC PANEL  BASIC METABOLIC PANEL  BASIC METABOLIC PANEL  BASIC METABOLIC PANEL  BETA-HYDROXYBUTYRIC ACID  BETA-HYDROXYBUTYRIC ACID  HEMOGLOBIN A1C    EKG None  Radiology  Procedures .Critical Care Performed by: Zadie Rhine, MD Authorized by: Zadie Rhine, MD   Critical care provider statement:    Critical care time (minutes):  60   Critical care start time:  03/09/2021 5:14 AM   Critical care end time:  03/09/2021 6:14 AM   Critical care time was exclusive of:  Separately billable procedures and treating other patients   Critical care was necessary to treat or prevent imminent or life-threatening  deterioration of the following conditions:  Shock, dehydration and endocrine crisis   Critical care was time spent personally by me on the following activities:  Discussions with consultants, evaluation of patient's response to treatment, examination of patient, re-evaluation of patient's condition, pulse oximetry, ordering and review of laboratory studies, review of old charts and ordering and performing treatments and interventions   I assumed direction of critical care for this patient from another provider in my specialty: no     Care discussed with: admitting provider   .Central Line  Date/Time: 03/09/2021 5:15 AM Performed by: Zadie Rhine, MD Authorized by: Zadie Rhine, MD   Consent:    Consent obtained:  Emergent situation   Alternatives discussed:  No treatment Universal protocol:    Patient identity confirmed:  Provided demographic data Pre-procedure details:    Indication(s): central venous access and insufficient peripheral access     Hand hygiene: Hand hygiene performed prior to insertion     Sterile barrier technique: All elements of maximal sterile technique followed     Skin preparation:  Chlorhexidine   Skin preparation agent: Skin preparation agent completely dried prior to procedure   Sedation:    Sedation type:  None Anesthesia:    Anesthesia method:  Local infiltration   Local anesthetic:  Lidocaine 1% w/o epi Procedure details:    Location:  R femoral   Site selection rationale:  Patient agitated   Patient position:  Supine   Procedural supplies:  Triple lumen   Catheter size:  7 Fr   Ultrasound guidance: yes     Ultrasound guidance timing: prior to insertion and real time     Sterile ultrasound techniques: Sterile gel and sterile probe covers were used     Number of attempts:  3   Successful placement: yes   Post-procedure details:    Post-procedure:  Dressing applied and line sutured   Assessment:  Blood return through all ports and free fluid  flow   Procedure completion:  Tolerated well, no immediate complications Comments:     Patient very agitated and confused due to DKA and likely substance abuse.  She already had right IJ central line earlier and was difficult access. Right femoral selected due to patient agitation and less likely to pull it out.  The line was placed under US guidance, with obvious venous blood return, no pulsatile blood and flushed easily.       Medications Ordered in ED Medications  insulin regular, human (MYXREDLIN) 100 units/ 100 mL infusion (has no administration in time range)  lactated ringers infusion (has no administration in time range)  dextrose 5 % in lactated ringers infusion (0 mLs Intravenous Hold 03/09/21 0540)  dextrose 50 % solution 0-50 mL (has no administration in time range)  pantoprazole (PROTONIX) injection 40 mg (has no administration in time range)  ondansetron (ZOFRAN) injection 4 mg (4 mg Intravenous Given 03/09/21 0527)  lactated ringers bolus 862 mL (862 mLs Intravenous New Bag/Given 03/09/21 0725)    ED Course  I have reviewed the triage vital signs and the nursing notes.  Pertinent labs results that were available during my care of the patient were reviewed by me and considered in my medical decision making (see chart for details).    MDM Rules/Calculators/A&P                          Patient with extensive history including substance abuse and diabetes.  Patient was just in the emergency department in DKA, but left against advice.  She had already had a right internal jugular central venous catheter placed that she had nursing staff remove prior to leaving.  Patient was seen by the prior ED and hospitalist team.  Patient soon returned after leaving the hospital reporting diffuse pain and shortness of breath.  She was clearly in DKA, tachypneic smells of ketones and tachycardic Multiple attempts at peripheral IVs were unsuccessful, including attempt at a left EJ  vein Therefore emergent right femoral central line was placed due to poor access and patient with a life-threatening condition.  Patient has altered mental status from her diabetic ketoacidosis and will be intermittently agitated.  For patient's safety restraints were ordered.  Patient was also placed under involuntary commitment as she is high risk for harming herself. Patient already had extensive work-up just prior to leaving the ER.  Discussed with Dr. Thomes Dinning for admission  7:30 AM After CL placement, initial VBG from central line @ 5:13 revealed higher than expected PO2,but less than what is expected from ABG.  Repeat VBG from CL performed @ 5:35 showed elevated PO2 but less (67) than before  Repeat assessment revealed the CL was clearly separate from femoral artery, and repeat US appeared to confirm CL in femoral vein.  Peripheral ABG was performed and shows higher PaO2 Repeat assessment reveals no femoral hematoma, distal pulses equal/intact and all [place] images during procedure indicated venous placement.  Also, blood was never pulsatile in central line.    At this point given critical nature of her illness, will continue with current femoral central line And she is awaiting admission Final Clinical Impression(s) / ED Diagnoses Final diagnoses:  Diabetic ketoacidosis with coma associated with type 1 diabetes mellitus (HCC)  Metabolic encephalopathy    Rx / DC Orders ED Discharge Orders    None       Zadie Rhine, MD 03/09/21 0258    Zadie Rhine, MD 03/09/21  0737  

## 2021-03-09 NOTE — ED Notes (Signed)
Date and time results received: 03/09/21 0522 (use smartphrase ".now" to insert current time)  Test: co2  Critical Value: 17.3  Name of Provider Notified: Dr. Bebe Shaggy  Orders Received? Or Actions Taken?: Actions Taken: no orders received

## 2021-03-09 NOTE — Progress Notes (Signed)
Pt transferred from ED RM 03 with this Clinical research associate and RN to the ICU RM 09. Will continue to monitor pt.

## 2021-03-09 NOTE — Progress Notes (Signed)
[name] is a 35 y.o. 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  Patient was admitted for DKA and has been started on IV fluid and IV insulin for treatment.  She had left AMA from the ED and then returned shortly after.  After her return to the ED, she was involuntarily committed and currently has a Comptroller at bedside.  She was seen and evaluated at bedside and has been admitted after midnight.  She currently is on IV fluid as well as insulin drip and continues to have significant hyperglycemia as well as acidosis.  Continue to monitor ongoing lab work.  Total care time: 35 minutes.

## 2021-03-09 NOTE — ED Notes (Signed)
PT rummaging through purse has vape in her hands, states she is looking for a cartridge. Advised pt that she cannot vape in the hospital. Pt starts cursing this nurse. Advised pt stop cursing. Security to bedside.

## 2021-03-09 NOTE — ED Notes (Addendum)
Patient threatening to remove central line herself and requests to leave AMA. DR. Adefesso notified.

## 2021-03-09 NOTE — ED Notes (Signed)
Dr. Lenoria Farrier in to speak with patient and encourage her to stay for treatment. Patient agreed. Pt then to bathroom and was vaping in bathroom. When pt confronted with refusing to abide by hospital policy pt demands central line to be removed and got dressed and left facility.  Central line removed, direct pressure applied x 5 minutes. Security escorted pt to facility exit.

## 2021-03-09 NOTE — ED Notes (Signed)
3Date and time results received: 03/09/21 0522 (use smartphrase ".now" to insert current time)  Test: pH  Critical Value: 7.173  Name of Provider Notified: Dr Bebe Shaggy  Orders Received? Or Actions Taken?: Actions Taken: no orders received

## 2021-03-09 NOTE — ED Notes (Addendum)
Pt requested her purse from counter top, ED tech provided this for her. Pt looking for her vape and vape cartridge. Advised pt that she cannot vape in hospital. Pt begins to curse staff and yell. Security to bedside. Pt requests to leave AMA. Dr. Lenoria Farrier informed. Pt refuses to place purse on counter or turn over her vape.

## 2021-03-10 DIAGNOSIS — I5022 Chronic systolic (congestive) heart failure: Secondary | ICD-10-CM

## 2021-03-10 DIAGNOSIS — E860 Dehydration: Secondary | ICD-10-CM | POA: Diagnosis not present

## 2021-03-10 DIAGNOSIS — E1011 Type 1 diabetes mellitus with ketoacidosis with coma: Secondary | ICD-10-CM

## 2021-03-10 DIAGNOSIS — K219 Gastro-esophageal reflux disease without esophagitis: Secondary | ICD-10-CM | POA: Diagnosis not present

## 2021-03-10 LAB — GLUCOSE, CAPILLARY
Glucose-Capillary: 117 mg/dL — ABNORMAL HIGH (ref 70–99)
Glucose-Capillary: 138 mg/dL — ABNORMAL HIGH (ref 70–99)
Glucose-Capillary: 149 mg/dL — ABNORMAL HIGH (ref 70–99)
Glucose-Capillary: 151 mg/dL — ABNORMAL HIGH (ref 70–99)
Glucose-Capillary: 160 mg/dL — ABNORMAL HIGH (ref 70–99)
Glucose-Capillary: 162 mg/dL — ABNORMAL HIGH (ref 70–99)
Glucose-Capillary: 162 mg/dL — ABNORMAL HIGH (ref 70–99)
Glucose-Capillary: 170 mg/dL — ABNORMAL HIGH (ref 70–99)
Glucose-Capillary: 19 mg/dL — CL (ref 70–99)
Glucose-Capillary: 194 mg/dL — ABNORMAL HIGH (ref 70–99)
Glucose-Capillary: 198 mg/dL — ABNORMAL HIGH (ref 70–99)
Glucose-Capillary: 55 mg/dL — ABNORMAL LOW (ref 70–99)
Glucose-Capillary: 89 mg/dL (ref 70–99)

## 2021-03-10 LAB — PHOSPHORUS: Phosphorus: <1 mg/dL — CL (ref 2.5–4.6)

## 2021-03-10 LAB — CBC
HCT: 35.3 % — ABNORMAL LOW (ref 36.0–46.0)
Hemoglobin: 11.7 g/dL — ABNORMAL LOW (ref 12.0–15.0)
MCH: 28.3 pg (ref 26.0–34.0)
MCHC: 33.1 g/dL (ref 30.0–36.0)
MCV: 85.5 fL (ref 80.0–100.0)
Platelets: 217 10*3/uL (ref 150–400)
RBC: 4.13 MIL/uL (ref 3.87–5.11)
RDW: 14.9 % (ref 11.5–15.5)
WBC: 17.2 10*3/uL — ABNORMAL HIGH (ref 4.0–10.5)
nRBC: 0 % (ref 0.0–0.2)

## 2021-03-10 LAB — PROTIME-INR
INR: 1.2 (ref 0.8–1.2)
Prothrombin Time: 15.3 seconds — ABNORMAL HIGH (ref 11.4–15.2)

## 2021-03-10 LAB — HEMOGLOBIN A1C
Hgb A1c MFr Bld: 11.8 % — ABNORMAL HIGH (ref 4.8–5.6)
Mean Plasma Glucose: 292 mg/dL

## 2021-03-10 LAB — COMPREHENSIVE METABOLIC PANEL
ALT: 14 U/L (ref 0–44)
AST: 24 U/L (ref 15–41)
Albumin: 2.7 g/dL — ABNORMAL LOW (ref 3.5–5.0)
Alkaline Phosphatase: 67 U/L (ref 38–126)
Anion gap: 6 (ref 5–15)
BUN: 17 mg/dL (ref 6–20)
CO2: 27 mmol/L (ref 22–32)
Calcium: 8.9 mg/dL (ref 8.9–10.3)
Chloride: 114 mmol/L — ABNORMAL HIGH (ref 98–111)
Creatinine, Ser: 0.59 mg/dL (ref 0.44–1.00)
GFR, Estimated: >60 mL/min (ref >60)
Glucose, Bld: 145 mg/dL — ABNORMAL HIGH (ref 70–99)
Potassium: 3.1 mmol/L — ABNORMAL LOW (ref 3.5–5.1)
Sodium: 147 mmol/L — ABNORMAL HIGH (ref 135–145)
Total Bilirubin: 0.3 mg/dL (ref 0.3–1.2)
Total Protein: 5.5 g/dL — ABNORMAL LOW (ref 6.5–8.1)

## 2021-03-10 LAB — BASIC METABOLIC PANEL
Anion gap: 4 — ABNORMAL LOW (ref 5–15)
BUN: 17 mg/dL (ref 6–20)
CO2: 27 mmol/L (ref 22–32)
Calcium: 8.8 mg/dL — ABNORMAL LOW (ref 8.9–10.3)
Chloride: 115 mmol/L — ABNORMAL HIGH (ref 98–111)
Creatinine, Ser: 0.67 mg/dL (ref 0.44–1.00)
GFR, Estimated: >60 mL/min (ref >60)
Glucose, Bld: 171 mg/dL — ABNORMAL HIGH (ref 70–99)
Potassium: 3.5 mmol/L (ref 3.5–5.1)
Sodium: 146 mmol/L — ABNORMAL HIGH (ref 135–145)

## 2021-03-10 LAB — APTT: aPTT: 29 seconds (ref 24–36)

## 2021-03-10 LAB — BETA-HYDROXYBUTYRIC ACID: Beta-Hydroxybutyric Acid: 0.5 mmol/L — ABNORMAL HIGH (ref 0.05–0.27)

## 2021-03-10 LAB — MAGNESIUM: Magnesium: 1.8 mg/dL (ref 1.7–2.4)

## 2021-03-10 MED ORDER — LISINOPRIL 5 MG PO TABS
5.0000 mg | ORAL_TABLET | Freq: Every day | ORAL | Status: DC
  Administered 2021-03-10 – 2021-03-12 (×3): 5 mg via ORAL
  Filled 2021-03-10 (×3): qty 1

## 2021-03-10 MED ORDER — ACETAMINOPHEN 325 MG PO TABS
650.0000 mg | ORAL_TABLET | Freq: Four times a day (QID) | ORAL | Status: DC | PRN
  Administered 2021-03-11: 650 mg via ORAL
  Filled 2021-03-10 (×2): qty 2

## 2021-03-10 MED ORDER — INSULIN ASPART 100 UNIT/ML IJ SOLN: 0.0000 [IU] | Freq: Every day | INTRAMUSCULAR | Status: DC

## 2021-03-10 MED ORDER — INSULIN DETEMIR 100 UNIT/ML ~~LOC~~ SOLN
12.0000 [IU] | Freq: Two times a day (BID) | SUBCUTANEOUS | Status: DC
  Administered 2021-03-10 (×2): 12 [IU] via SUBCUTANEOUS
  Filled 2021-03-10 (×5): qty 0.12

## 2021-03-10 MED ORDER — GABAPENTIN 300 MG PO CAPS
300.0000 mg | ORAL_CAPSULE | Freq: Three times a day (TID) | ORAL | Status: DC
  Administered 2021-03-10 (×2): 300 mg via ORAL
  Filled 2021-03-10 (×2): qty 1

## 2021-03-10 MED ORDER — INSULIN ASPART 100 UNIT/ML IJ SOLN: 0.0000 [IU] | Freq: Three times a day (TID) | INTRAMUSCULAR | Status: DC

## 2021-03-10 MED ORDER — CARVEDILOL 3.125 MG PO TABS
6.2500 mg | ORAL_TABLET | Freq: Two times a day (BID) | ORAL | Status: DC
  Administered 2021-03-10 – 2021-03-12 (×4): 6.25 mg via ORAL
  Filled 2021-03-10 (×4): qty 2

## 2021-03-10 MED ORDER — DEXTROSE 50 % IV SOLN
INTRAVENOUS | Status: AC
  Administered 2021-03-10: 50 mL
  Filled 2021-03-10: qty 50

## 2021-03-10 MED ORDER — POTASSIUM PHOSPHATES 15 MMOLE/5ML IV SOLN
30.0000 mmol | Freq: Once | INTRAVENOUS | Status: AC
  Administered 2021-03-10: 30 mmol via INTRAVENOUS
  Filled 2021-03-10: qty 10

## 2021-03-10 NOTE — Progress Notes (Signed)
PROGRESS NOTE    [name]  WUX:[id number] DOB: 01-28-1986 DOA: 03/09/2021 PCP: [facility]  No chief complaint on file.   Brief Narrative: As per H&P written by Dr. Josephine Cables on 03/09/2021 [name] is a 35 y.o. 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  Patient was admitted from 2/14 - 2/27 due to renal failure secondary to COVID-19 and superimposed bacterial pneumonia with MRSA sepsis. Patient was started on DKA protocol after central line was already placed, but she left AMA only to return within 1 to 2 hours.  Assessment & Plan: 1-type I DKA: In the setting of medication noncompliance -No chest pain or acute ischemic abnormalities on EKG. -Reports feeling much better after fluid resuscitation and insulin drip -Following patient's basic metabolic panel acidosis now resolved and the patient had met criteria to be transition of insulin drip. -Will restart a sliding scale insulin, along with Levemir twice a day and modify carbohydrate diet. -Follow modified carbohydrate diet  2-nausea and vomiting -In the setting of DKA -Continue as needed antiemetic -Advancing diet and continue use of PPI.  3-tobacco abuse -Cessation counseling provided -Patient declined nicotine patch.  4-hypernatremia and hypophosphatemia -In the setting of dehydration and electrolyte abnormality with hypoglycemic crisis -Continue electrolyte repletion; telemetry monitoring and follow trend.  5-gastroesophageal reflux disease -Continue PPI.  6-hyperkalemia -In the setting of metabolic acidosis from DKA -Currently stable and within normal limits -Continue to follow electrolytes trend.  7-history  of systolic heart failure in the setting of nonischemic cardiomyopathy -Most recent ejection fraction February 2022 30-35% -No signs of fluid overload currently -Resume the use of Coreg and lisinopril -Follow daily weights, strict I's and O's and low-sodium diet.   DVT prophylaxis: Lovenox Code Status: Full code. Family Communication: No family at bedside.  Plan of care discussed directly with patient. Disposition:   Status is: Inpatient  Dispo: The patient is from: home               Anticipated d/c is to: home              Patient currently no medically stable for discharge; transitioning off insulin drip, advancing diet and assessing glucose fluctuation.  Also still demonstrating low phosphorus and other electrolytes abnormalities.    Difficult to place patient no    Consultants:   None   Procedures:  See below for x-ray reports.   Antimicrobials:  None   Subjective: Afebrile, no chest pain, no nausea, no vomiting. Reports feeling better and being hungry.  Objective: Vitals:   03/10/21 1122 03/10/21 1200 03/10/21 1300 03/10/21 1500  BP:  (!) 152/75 (!) 164/94 (!) 113/58  Pulse:      Resp: _0 Temp: 98.1 F (36.7 C)     TempSrc: Oral     SpO2:      Weight:      Height:        Intake/Output Summary (Last 24 hours) at 03/10/2021 1527 Last data filed at 03/10/2021 1340 Gross per 24 hour  Intake 3232.17 ml  Output 800 ml  Net 2432.17 ml   Filed Weights   03/09/21 0211 03/09/21 1258  Weight: 43.1 kg 43.6 kg    Examination:  General exam:  Appears calm and comfortable; patient reports no chest pain, no nausea or vomiting at this time.  Expressed to be hungry.  She is oriented x3, following commands appropriately and demonstrating competency. Respiratory system: Positive scattered rhonchi; no using accessory muscles.  Good oxygen saturation on room air. Cardiovascular system: S1 & S2 heard, no rubs, no gallops, no JVD on exam. Gastrointestinal  system: Abdomen is nondistended, soft and nontender. No organomegaly or masses felt. Normal bowel sounds heard. Central nervous system: Alert and oriented. No focal neurological deficits. Extremities: No cyanosis or clubbing. Skin: No rashes, no petechiae. Psychiatry: Judgement and insight appear normal.  Flat affect; no suicidal ideation or hallucinations.    Data Reviewed: I have personally reviewed following labs and imaging studies  CBC: Recent Labs  Lab 03/08/21 1951 03/09/21 0431 03/09/21 0539 03/10/21 0443  WBC 29.2* 28.9*  --  17.2*  HGB 14.9 13.1 13.9 11.7*  HCT 51.0* 41.0 41.0 35.3*  MCV 97.5 90.5  --  85.5  PLT 394 305  --  093    Basic Metabolic Panel: Recent Labs  Lab 03/09/21 0431 03/09/21 0539 03/09/21 1020 03/09/21 1700 03/09/21 2100 03/10/21 0125 03/10/21 0443  NA 137   < > 140 141 144 146* 147*  K 5.4*   < > 4.3 3.9 3.7 3.5 3.1*  CL 104   < > 111 111 113* 115* 114*  CO2 8*  --  10* _0 GLUCOSE 509*   < > 355* 231* 196* 171* 145*  BUN 34*   < > 31* 22* _1 CREATININE 1.30*   < > 1.18* 0.75 0.69 0.67 0.59  CALCIUM 9.0  --  8.7* 8.4* 8.7* 8.8* 8.9  MG 1.9  --   --   --   --   --  1.8  PHOS 3.2  --   --   --   --   --  <1.0*   < > = values in this interval not displayed.    GFR: Estimated Creatinine Clearance: 66.3 mL/min (by C-G formula based on SCr of 0.59 mg/dL).  Liver Function Tests: Recent Labs  Lab 03/09/21 0431 03/10/21 0443  AST 34 24  ALT 17 14  ALKPHOS 77 67  BILITOT 1.9* 0.3  PROT 6.7 5.5*  ALBUMIN 3.5 2.7*    CBG: Recent Labs  Lab 03/10/21 0528 03/10/21 0631 03/10/21 0733 03/10/21 0932 03/10/21 1119  GLUCAP 151* 170* 162* 149* 198*     Recent Results (from the past 240 hour(s))  Resp Panel by RT-PCR (Flu A&B, Covid) Nasopharyngeal Swab     Status: None   Collection Time: 03/09/21  4:06 AM   Specimen: Nasopharyngeal Swab; Nasopharyngeal(NP) swabs in vial transport medium  Result Value Ref Range  Status   SARS Coronavirus 2 by RT PCR NEGATIVE NEGATIVE Final    Comment: (NOTE) SARS-CoV-2 target nucleic acids are NOT DETECTED.  The SARS-CoV-2 RNA is generally detectable in upper respiratory specimens during the acute phase of infection. The lowest concentration of SARS-CoV-2 viral copies this assay can detect is 138 copies/mL. A negative result does not preclude SARS-Cov-2 infection and should not be used as the sole basis for treatment or other patient management decisions. A negative result may occur with  improper specimen collection/handling, submission of specimen other than nasopharyngeal swab, presence of viral mutation(s) within the areas targeted by this assay, and inadequate number of viral copies(<138 copies/mL). A negative result must be combined with clinical observations, patient  history, and epidemiological information. The expected result is Negative.  Fact Sheet for Patients:  EntrepreneurPulse.com.au  Fact Sheet for Healthcare Providers:  IncredibleEmployment.be  This test is no t yet approved or cleared by the [place] FDA and  has been authorized for detection and/or diagnosis of SARS-CoV-2 by FDA under an Emergency Use Authorization (EUA). This EUA will remain  in effect (meaning this test can be used) for the duration of the COVID-19 declaration under Section 564(b)(1) of the Act, 21 U.S.C.section 360bbb-3(b)(1), unless the authorization is terminated  or revoked sooner.       Influenza A by PCR NEGATIVE NEGATIVE Final   Influenza B by PCR NEGATIVE NEGATIVE Final    Comment: (NOTE) The Xpert Xpress SARS-CoV-2/FLU/RSV plus assay is intended as an aid in the diagnosis of influenza from Nasopharyngeal swab specimens and should not be used as a sole basis for treatment. Nasal washings and aspirates are unacceptable for Xpert Xpress SARS-CoV-2/FLU/RSV testing.  Fact Sheet for  Patients: EntrepreneurPulse.com.au  Fact Sheet for Healthcare Providers: IncredibleEmployment.be  This test is not yet approved or cleared by the [place] FDA and has been authorized for detection and/or diagnosis of SARS-CoV-2 by FDA under an Emergency Use Authorization (EUA). This EUA will remain in effect (meaning this test can be used) for the duration of the COVID-19 declaration under Section 564(b)(1) of the Act, 21 U.S.C. section 360bbb-3(b)(1), unless the authorization is terminated or revoked.  Performed at [facility], [address], [place], [place] [address]   MRSA PCR Screening     Status: Abnormal   Collection Time: 03/09/21  1:05 PM   Specimen: Nasopharyngeal  Result Value Ref Range Status   MRSA by PCR POSITIVE (A) NEGATIVE Final    Comment:        The GeneXpert MRSA Assay (FDA approved for NASAL specimens only), is one component of a comprehensive MRSA colonization surveillance program. It is not intended to diagnose MRSA infection nor to guide or monitor treatment for MRSA infections. RESULT CALLED TO, READ BACK BY AND VERIFIED WITH: SHELTON,ASHLEY _0  03/09/21 BY JONES,T Performed at [facility], [address] [place], [place] [address]      Radiology Studies: DG Chest 1 View  Result Date: 03/08/2021 CLINICAL DATA:  Central venous catheter placement EXAM: CHEST  1 VIEW COMPARISON:  03/08/2021 FINDINGS: Right internal jugular central venous catheter tip noted within the superior vena cava. Lungs are clear. No pneumothorax or pleural effusion. Cardiac size within normal limits. The pulmonary vascularity is normal. IMPRESSION: Right internal jugular central venous catheter tip within the superior vena cava. No pneumothorax. Electronically Signed   By: Fidela Salisbury MD   On: 03/08/2021 22:39   DG Chest 2 View  Result Date: 03/08/2021 CLINICAL DATA:  Shortness of breath for 2 days, history hypertension, CHF,  smoker; was COVID-19 positive with severe MRSA shock in February 2022 EXAM: CHEST - 2 VIEW COMPARISON:  12/28/2020 FINDINGS: Normal heart size, mediastinal contours, and pulmonary vascularity. Lungs slightly hyperinflated with small residual RIGHT pleural effusion. Extensive infiltrates seen on previous exam resolved. Remaining lungs clear. No pneumothorax or acute osseous findings. IMPRESSION: Mildly hyperinflated lungs with resolved pulmonary infiltrates and small residual RIGHT pleural effusion. Electronically Signed   By: Lavonia Dana M.D.   On: 03/08/2021 18:48   Scheduled Meds: . carvedilol  6.25 mg Oral BID WC  . Chlorhexidine Gluconate Cloth  6 each Topical Daily  . enoxaparin (LOVENOX) injection  30 mg Subcutaneous Q24H  . gabapentin  300 mg Oral TID  . insulin aspart  0-15 Units Subcutaneous TID WC  . insulin aspart  0-5 Units Subcutaneous QHS  . insulin detemir  12 Units Subcutaneous BID  . lisinopril  5 mg Oral Daily  . mupirocin ointment   Nasal BID  . pantoprazole (PROTONIX) IV  40 mg Intravenous Q24H   Continuous Infusions: . lactated ringers 50 mL/hr at 03/10/21 1341  . potassium PHOSPHATE IVPB (in mmol) 85 mL/hr at 03/10/21 1340     LOS: 1 day    Time spent: 35 minutes   Barton Dubois, MD [facility]   To contact the attending provider between 7A-7P or the covering provider during after hours 7P-7A, please log into the web site [email or web address] and access using universal [place] password for that web site. If you do not have the password, please call the hospital operator.  03/10/2021, 3:27 PM

## 2021-03-10 NOTE — Progress Notes (Signed)
Critical value phosphorus less than 1, MD notified.

## 2021-03-11 DIAGNOSIS — E1011 Type 1 diabetes mellitus with ketoacidosis with coma: Secondary | ICD-10-CM | POA: Diagnosis not present

## 2021-03-11 DIAGNOSIS — I5022 Chronic systolic (congestive) heart failure: Secondary | ICD-10-CM | POA: Diagnosis not present

## 2021-03-11 DIAGNOSIS — E860 Dehydration: Secondary | ICD-10-CM | POA: Diagnosis not present

## 2021-03-11 DIAGNOSIS — K219 Gastro-esophageal reflux disease without esophagitis: Secondary | ICD-10-CM | POA: Diagnosis not present

## 2021-03-11 LAB — GLUCOSE, CAPILLARY
Glucose-Capillary: 110 mg/dL — ABNORMAL HIGH (ref 70–99)
Glucose-Capillary: 128 mg/dL — ABNORMAL HIGH (ref 70–99)
Glucose-Capillary: 155 mg/dL — ABNORMAL HIGH (ref 70–99)
Glucose-Capillary: 199 mg/dL — ABNORMAL HIGH (ref 70–99)
Glucose-Capillary: 242 mg/dL — ABNORMAL HIGH (ref 70–99)
Glucose-Capillary: 252 mg/dL — ABNORMAL HIGH (ref 70–99)
Glucose-Capillary: 290 mg/dL — ABNORMAL HIGH (ref 70–99)
Glucose-Capillary: 291 mg/dL — ABNORMAL HIGH (ref 70–99)
Glucose-Capillary: 384 mg/dL — ABNORMAL HIGH (ref 70–99)

## 2021-03-11 LAB — BASIC METABOLIC PANEL
Anion gap: 6 (ref 5–15)
BUN: 7 mg/dL (ref 6–20)
CO2: 30 mmol/L (ref 22–32)
Calcium: 7.6 mg/dL — ABNORMAL LOW (ref 8.9–10.3)
Chloride: 99 mmol/L (ref 98–111)
Creatinine, Ser: 0.39 mg/dL — ABNORMAL LOW (ref 0.44–1.00)
GFR, Estimated: >60 mL/min (ref >60)
Glucose, Bld: 309 mg/dL — ABNORMAL HIGH (ref 70–99)
Potassium: 3.9 mmol/L (ref 3.5–5.1)
Sodium: 135 mmol/L (ref 135–145)

## 2021-03-11 LAB — BLOOD GAS, VENOUS
Acid-Base Excess: 6.8 mmol/L — ABNORMAL HIGH (ref 0.0–2.0)
Bicarbonate: 27 mmol/L (ref 20.0–28.0)
FIO2: 36
O2 Saturation: 18.8 %
Patient temperature: 36.3
pCO2, Ven: 64.8 mmHg — ABNORMAL HIGH (ref 44.0–60.0)
pH, Ven: 7.321 (ref 7.250–7.430)
pO2, Ven: <31 mmHg — CL (ref 32.0–45.0)

## 2021-03-11 LAB — RAPID URINE DRUG SCREEN, HOSP PERFORMED
Amphetamines: NOT DETECTED
Barbiturates: NOT DETECTED
Benzodiazepines: NOT DETECTED
Cocaine: POSITIVE — AB
Opiates: NOT DETECTED
Tetrahydrocannabinol: NOT DETECTED

## 2021-03-11 LAB — MAGNESIUM: Magnesium: 1.4 mg/dL — ABNORMAL LOW (ref 1.7–2.4)

## 2021-03-11 LAB — PHOSPHORUS: Phosphorus: 2 mg/dL — ABNORMAL LOW (ref 2.5–4.6)

## 2021-03-11 MED ORDER — INSULIN ASPART 100 UNIT/ML IJ SOLN
0.0000 [IU] | Freq: Three times a day (TID) | INTRAMUSCULAR | Status: DC
  Administered 2021-03-11: 3 [IU] via SUBCUTANEOUS
  Administered 2021-03-11: 5 [IU] via SUBCUTANEOUS
  Administered 2021-03-12: 6 [IU] via SUBCUTANEOUS
  Administered 2021-03-12: 5 [IU] via SUBCUTANEOUS

## 2021-03-11 MED ORDER — INSULIN DETEMIR 100 UNIT/ML ~~LOC~~ SOLN
5.0000 [IU] | Freq: Once | SUBCUTANEOUS | Status: AC
  Administered 2021-03-11: 5 [IU] via SUBCUTANEOUS
  Filled 2021-03-11: qty 0.05

## 2021-03-11 MED ORDER — DEXTROSE 50 % IV SOLN
INTRAVENOUS | Status: AC
  Filled 2021-03-11: qty 50

## 2021-03-11 MED ORDER — INSULIN ASPART 100 UNIT/ML IJ SOLN
0.0000 [IU] | Freq: Every day | INTRAMUSCULAR | Status: DC
  Administered 2021-03-11: 3 [IU] via SUBCUTANEOUS

## 2021-03-11 MED ORDER — DEXTROSE 50 % IV SOLN: 1.0000 | Freq: Once | INTRAVENOUS | Status: DC

## 2021-03-11 MED ORDER — NALOXONE HCL 0.4 MG/ML IJ SOLN
INTRAMUSCULAR | Status: AC
  Filled 2021-03-11: qty 1

## 2021-03-11 MED ORDER — INSULIN DETEMIR 100 UNIT/ML ~~LOC~~ SOLN
5.0000 [IU] | Freq: Two times a day (BID) | SUBCUTANEOUS | Status: DC
  Filled 2021-03-11 (×3): qty 0.05

## 2021-03-11 MED ORDER — DOCUSATE SODIUM 100 MG PO CAPS
100.0000 mg | ORAL_CAPSULE | Freq: Two times a day (BID) | ORAL | Status: DC
  Administered 2021-03-11 – 2021-03-12 (×2): 100 mg via ORAL
  Filled 2021-03-11 (×2): qty 1

## 2021-03-11 MED ORDER — POTASSIUM PHOSPHATES 15 MMOLE/5ML IV SOLN
20.0000 mmol | Freq: Once | INTRAVENOUS | Status: AC
  Administered 2021-03-11: 20 mmol via INTRAVENOUS
  Filled 2021-03-11: qty 6.67

## 2021-03-11 MED ORDER — MAGNESIUM SULFATE 2 GM/50ML IV SOLN
2.0000 g | Freq: Once | INTRAVENOUS | Status: AC
  Administered 2021-03-11: 2 g via INTRAVENOUS
  Filled 2021-03-11: qty 50

## 2021-03-11 MED ORDER — INSULIN ASPART 100 UNIT/ML IJ SOLN
2.0000 [IU] | Freq: Three times a day (TID) | INTRAMUSCULAR | Status: DC
  Administered 2021-03-11 – 2021-03-12 (×4): 2 [IU] via SUBCUTANEOUS

## 2021-03-11 MED ORDER — PHENYLEPHRINE-MINERAL OIL-PET 0.25-14-74.9 % RE OINT
1.0000 "application " | TOPICAL_OINTMENT | Freq: Two times a day (BID) | RECTAL | Status: DC | PRN
  Filled 2021-03-11: qty 57

## 2021-03-11 MED ORDER — POLYETHYLENE GLYCOL 3350 17 G PO PACK
17.0000 g | PACK | Freq: Every day | ORAL | Status: DC
  Administered 2021-03-11: 17 g via ORAL
  Filled 2021-03-11 (×2): qty 1

## 2021-03-11 NOTE — Progress Notes (Signed)
Inpatient Diabetes Program Recommendations  AACE/ADA: New Consensus Statement on Inpatient Glycemic Control (2015)  Target Ranges:  Prepandial:   less than 140 mg/dL      Peak postprandial:   less than 180 mg/dL (1-2 hours)      Critically ill patients:  140 - 180 mg/dL   Lab Results  Component Value Date   GLUCAP 110 (H) 03/11/2021   HGBA1C 11.8 (H) 03/09/2021    Review of Glycemic Control  Diabetes history: DM1 Outpatient Diabetes medications: Lantus 30 units QHS, Novolog 1-15 units TID with meals with CHO ratio of 1:15. Current orders for Inpatient glycemic control: Levemir 12 units BID, Novolog 0-15 units TID with meals and 0-5 HS  Severe hypoglycemia this am. CBG < 10. Likely d/t poor po intake and Levemir given 7 hours apart (instead of 12 hours).  Inpatient Diabetes Program Recommendations:     Decrease Levemir to 6 units BID Decrease Novolog to 0-6 units Q4H. If eating > 50%, add Novolog 3 units TID.  Follow glucose trends. Avoid hypoglycemia.  Thank you. Ailene Ards, RD, LDN, CDE Inpatient Diabetes Coordinator [phone]

## 2021-03-11 NOTE — Progress Notes (Signed)
PROGRESS NOTE    [name]  MIW:[id number] DOB: 24-Nov-1985 DOA: 03/09/2021 PCP: [facility]  No chief complaint on file.   Brief Narrative: As per H&P written by Dr. Thomes Dinning on 03/09/2021 [name] is a 35 y.o. 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  Patient was admitted from 2/14 - 2/27 due to renal failure secondary to COVID-19 and superimposed bacterial pneumonia with MRSA sepsis. Patient was started on DKA protocol after central line was already placed, but she left AMA only to return within 1 to 2 hours.  Assessment & Plan: 1-type I DKA: In the setting of medication noncompliance and the use of cocaine. -UDS positive for cocaine. -No chest pain or acute ischemic abnormalities on EKG. -Patient transition off insulin drip and started on sliding scale insulin and Levemir; severe hypoglycemic event on early morning of 03/11/2021.  Closely developed sudden to be follow insulin dose adjusted and recommendations by diabetes coordinator appreciated. -Continue close monitoring and maintain adequate hydration -Patient mentation has improved and is essentially back to her baseline currently.  2-nausea and vomiting -In the setting of DKA -Continue as needed antiemetic -Patient reports no nausea vomiting currently -Continue to watch diet tolerance and continue the use of PPI.  3-tobacco abuse -Cessation counseling provided -Patient declined nicotine patch.  4-hypernatremia and hypophosphatemia -In the setting of dehydration and electrolyte abnormality with hypoglycemic crisis -Hypernatremia essentially resolved/improved; phosphorus still low -Continue repletion and follow  trend.  5-gastroesophageal reflux disease -Continue PPI.  6-hyperkalemia -In the setting of metabolic acidosis from DKA -Resolved -Repeat basic metabolic panel to follow lites stability. -No telemetry monitoring needed currently.  7-history of systolic heart failure in the setting of nonischemic cardiomyopathy -Most recent ejection fraction February 2022 30-35% -No signs of fluid overload currently -Continue the use of Coreg and lisinopril -Follow daily weights, strict I's and O's and low-sodium diet.   DVT prophylaxis: Lovenox Code Status: Full code. Family Communication: No family at bedside.  Plan of care discussed directly with patient. Disposition:   Status is: Inpatient  Dispo: The patient is from: home               Anticipated d/c is to: home              Patient currently no medically stable for discharge; patient was successfully transitioned off insulin drip and started on a sliding scale insulin and Levemir (amount of long-acting insulin was less than home dose).  Overnight with severe hypoglycemic event.  Still demonstrating abnormal electrolytes.   Difficult to place patient no    Consultants:   None   Procedures:  See below for x-ray reports.   Antimicrobials:  None   Subjective: Overnight with significant hypoglycemic event requiring rapid response to be called.  Patient CBG was less than 10.  No fever, no chest pain, no nausea, no vomiting.  Objective: Vitals:   03/10/21 2200 03/10/21 2247 03/11/21 0559 03/11/21 0835  BP: (!) 141/97 119/86 121/76 138/83  Pulse:  83 (!) 57   Resp: 18 16 17    Temp:  98.1 F (36.7 C) (!) 97.3 F (36.3 C)   TempSrc:  Oral Axillary   SpO2:  (!) 89% 95%   Weight:  Height:        Intake/Output Summary (Last 24 hours) at 03/11/2021 1039 Last data filed at 03/11/2021 0347 Gross per 24 hour  Intake 2703.41 ml  Output 1450 ml  Net 1253.41 ml   Filed Weights   03/09/21 0211 03/09/21 1258  Weight: 43.1 kg  43.6 kg    Examination: General exam: Alert, awake, oriented x 3; patient with acute rapid response episode earlier this morning in the setting of severe hypoglycemic event.  No chest pain, no nausea, no vomiting.  No fever. Respiratory system: Good air movement bilaterally; no using accessory muscle.  No wheezing or crackles appreciated. Cardiovascular system: Rate controlled, no rubs, no gallops, no JVD. Gastrointestinal system: Abdomen is nondistended, soft and nontender. No organomegaly or masses felt. Normal bowel sounds heard. Central nervous system: Alert and oriented. No focal neurological deficits. Extremities: No cyanosis or clubbing. Skin: No petechiae. Psychiatry: Flat affect appreciated; no suicidal ideation or hallucinations.   Data Reviewed: I have personally reviewed following labs and imaging studies  CBC: Recent Labs  Lab 03/08/21 1951 03/09/21 0431 03/09/21 0539 03/10/21 0443  WBC 29.2* 28.9*  --  17.2*  HGB 14.9 13.1 13.9 11.7*  HCT 51.0* 41.0 41.0 35.3*  MCV 97.5 90.5  --  85.5  PLT 394 305  --  217    Basic Metabolic Panel: Recent Labs  Lab 03/09/21 0431 03/09/21 0539 03/09/21 1020 03/09/21 1700 03/09/21 2100 03/10/21 0125 03/10/21 0443 03/11/21 0458  NA 137   < > 140 141 144 146* 147*  --   K 5.4*   < > 4.3 3.9 3.7 3.5 3.1*  --   CL 104   < > 111 111 113* 115* 114*  --   CO2 8*  --  10* 24 26 27 27   --   GLUCOSE 509*   < > 355* 231* 196* 171* 145*  --   BUN 34*   < > 31* 22* 19 17 17   --   CREATININE 1.30*   < > 1.18* 0.75 0.69 0.67 0.59  --   CALCIUM 9.0  --  8.7* 8.4* 8.7* 8.8* 8.9  --   MG 1.9  --   --   --   --   --  1.8  --   PHOS 3.2  --   --   --   --   --  <1.0* 2.0*   < > = values in this interval not displayed.    GFR: Estimated Creatinine Clearance: 66.3 mL/min (by C-G formula based on SCr of 0.59 mg/dL).  Liver Function Tests: Recent Labs  Lab 03/09/21 0431 03/10/21 0443  AST 34 24  ALT 17 14  ALKPHOS 77 67  BILITOT  1.9* 0.3  PROT 6.7 5.5*  ALBUMIN 3.5 2.7*    CBG: Recent Labs  Lab 03/11/21 0646 03/11/21 0658 03/11/21 0720 03/11/21 0742 03/11/21 1009  GLUCAP <10* 155* 128* 110* 252*     Recent Results (from the past 240 hour(s))  Resp Panel by RT-PCR (Flu A&B, Covid) Nasopharyngeal Swab     Status: None   Collection Time: 03/09/21  4:06 AM   Specimen: Nasopharyngeal Swab; Nasopharyngeal(NP) swabs in vial transport medium  Result Value Ref Range Status   SARS Coronavirus 2 by RT PCR NEGATIVE NEGATIVE Final    Comment: (NOTE) SARS-CoV-2 target nucleic acids are NOT DETECTED.  The SARS-CoV-2 RNA is generally detectable in upper respiratory specimens during the acute phase of infection. The lowest concentration of SARS-CoV-2  viral copies this assay can detect is 138 copies/mL. A negative result does not preclude SARS-Cov-2 infection and should not be used as the sole basis for treatment or other patient management decisions. A negative result may occur with  improper specimen collection/handling, submission of specimen other than nasopharyngeal swab, presence of viral mutation(s) within the areas targeted by this assay, and inadequate number of viral copies(<138 copies/mL). A negative result must be combined with clinical observations, patient history, and epidemiological information. The expected result is Negative.  Fact Sheet for Patients:  BloggerCourse.com  Fact Sheet for Healthcare Providers:  SeriousBroker.it  This test is no t yet approved or cleared by the [place] FDA and  has been authorized for detection and/or diagnosis of SARS-CoV-2 by FDA under an Emergency Use Authorization (EUA). This EUA will remain  in effect (meaning this test can be used) for the duration of the COVID-19 declaration under Section 564(b)(1) of the Act, 21 U.S.C.section 360bbb-3(b)(1), unless the authorization is terminated  or revoked sooner.        Influenza A by PCR NEGATIVE NEGATIVE Final   Influenza B by PCR NEGATIVE NEGATIVE Final    Comment: (NOTE) The Xpert Xpress SARS-CoV-2/FLU/RSV plus assay is intended as an aid in the diagnosis of influenza from Nasopharyngeal swab specimens and should not be used as a sole basis for treatment. Nasal washings and aspirates are unacceptable for Xpert Xpress SARS-CoV-2/FLU/RSV testing.  Fact Sheet for Patients: BloggerCourse.com  Fact Sheet for Healthcare Providers: SeriousBroker.it  This test is not yet approved or cleared by the [place] FDA and has been authorized for detection and/or diagnosis of SARS-CoV-2 by FDA under an Emergency Use Authorization (EUA). This EUA will remain in effect (meaning this test can be used) for the duration of the COVID-19 declaration under Section 564(b)(1) of the Act, 21 U.S.C. section 360bbb-3(b)(1), unless the authorization is terminated or revoked.  Performed at [facility], [address], [place], [place] [address]   MRSA PCR Screening     Status: Abnormal   Collection Time: 03/09/21  1:05 PM   Specimen: Nasopharyngeal  Result Value Ref Range Status   MRSA by PCR POSITIVE (A) NEGATIVE Final    Comment:        The GeneXpert MRSA Assay (FDA approved for NASAL specimens only), is one component of a comprehensive MRSA colonization surveillance program. It is not intended to diagnose MRSA infection nor to guide or monitor treatment for MRSA infections. RESULT CALLED TO, READ BACK BY AND VERIFIED WITH: SHELTON,ASHLEY @1606  03/09/21 BY JONES,T Performed at [facility], [address], [place], [place]      Radiology Studies: No results found. Scheduled Meds: . carvedilol  6.25 mg Oral BID WC  . Chlorhexidine Gluconate Cloth  6 each Topical Daily  . dextrose  1 ampule Intravenous Once  . dextrose  1 ampule Intravenous Once  . enoxaparin (LOVENOX) injection  30 mg  Subcutaneous Q24H  . insulin aspart  0-5 Units Subcutaneous QHS  . insulin aspart  0-6 Units Subcutaneous TID WC  . insulin aspart  2 Units Subcutaneous TID WC  . insulin detemir  5 Units Subcutaneous BID  . lisinopril  5 mg Oral Daily  . mupirocin ointment   Nasal BID  . pantoprazole (PROTONIX) IV  40 mg Intravenous Q24H   Continuous Infusions: . lactated ringers 50 mL/hr at 03/11/21 1000  . potassium PHOSPHATE IVPB (in mmol)       LOS: 2 days  Time spent: 35 minutes   Vassie Loll, MD [facility]   To contact the attending provider between 7A-7P or the covering provider during after hours 7P-7A, please log into the web site [email or web address] and access using universal [place] password for that web site. If you do not have the password, please call the hospital operator.  03/11/2021, 10:39 AM

## 2021-03-11 NOTE — Progress Notes (Signed)
0545 pt unresponsive to sternal rubs and pain stimulation. VSS, BS read on glucometer LOW. On call notified, 1amp D50 given with little to no response. Rapid response called. VSS. BS up to 155. 0.4 narcan given at 0708 as a precaution to pts drug abuse hx. Dr. Gwenlyn Perking in room. Pt waking up, still nonverbal. Will continue to monitor and recheck BSG Q2-3hrs. Report given to day nurse.

## 2021-03-11 NOTE — TOC Initial Note (Signed)
Transition of Care [facility]) - Initial/Assessment Note    Patient Details  Name: [name] MRN: [id number] Date of Birth: 06/26/1986  [facility] CM/SW Contact:    Annice Needy, LCSW Phone Number: 03/11/2021, 11:53 AM  Clinical Narrative:                 Patient considered high risk for readmission. Admitted for type I DKA in setting of medication noncompliance and use of cocaine.  Patient is able to get her medications. Indicates that she was "barely" taking medications PTA. States "I didn't take them for a few days. I was gone out with friends and I did not take them." Discussed that importance of medication compliance for overall positive health outcomes. Patient verbalized understanding and stated "I don't intend on not taking them anymore." Patient indicated that she has no unmet needs.   Expected Discharge Plan: Home/Self Care Barriers to Discharge: Continued Medical Work up   Patient Goals and CMS Choice Patient states their goals for this hospitalization and ongoing recovery are:: return home and take medications      Expected Discharge Plan and Services Expected Discharge Plan: Home/Self Care       Living arrangements for the past 2 months: Single Family Home                                      Prior Living Arrangements/Services Living arrangements for the past 2 months: Single Family Home   Patient language and need for interpreter reviewed:: Yes        Need for Family Participation in Patient Care: Yes (Comment) Care giver support system in place?: Yes (comment)   Criminal Activity/Legal Involvement Pertinent to Current Situation/Hospitalization: No - Comment as needed  Activities of Daily Living      Permission Sought/Granted                  Emotional Assessment     Affect (typically observed): Appropriate Orientation: : Oriented to Self,Oriented to Place,Oriented to  Time,Oriented to Situation Alcohol / Substance  Use: Not Applicable Psych Involvement: No (comment)  Admission diagnosis:  Metabolic encephalopathy [G93.41] DKA (diabetic ketoacidosis) (HCC) [E11.10] Diabetic ketoacidosis with coma associated with type 1 diabetes mellitus (HCC) [E10.11] Patient Active Problem List   Diagnosis Date Noted  . Dehydration 03/09/2021  . Hyperkalemia 03/09/2021  . DKA (diabetic ketoacidosis) (HCC) 03/08/2021  . Congestive heart failure (CHF) (HCC) 12/28/2020  . Systolic CHF, acute ----EF 30 to 35 % 12/03/2020  . 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 Pneumonia due to COVID-19 virus 12/01/2020  . Acute Hypoxic respiratory failure due to COVID-19 (HCC) 12/01/2020  . Sepsis secondary to community-acquired pneumonia with parapneumonic effusion 12/01/2020  . CAP (community acquired pneumonia)--- with parapneumonic effusion/MRSA Empyema 12/01/2020  . Severe MRSA sepsis with septic shock -secondary to community-acquired pneumonia with parapneumonic effusion/Empyema 12/01/2020  . DKA, type 1 (HCC) 11/30/2020  . Hyperosmolar hyperglycemic state (HHS) (HCC) 04/06/2020  . Generalized abdominal pain   . Cocaine abuse (HCC)   . Pyelonephritis 03/08/2019  . Abscess   . Systolic murmur   . Hyperglycemia due to type 1 diabetes mellitus (HCC) 10/28/2018  . Substance abuse (HCC) 09/15/2018  . Tobacco abuse 09/15/2018  . Cough 09/15/2018  . Protein-calorie malnutrition, severe (HCC) 09/15/2018  . GERD (gastroesophageal reflux disease) 09/15/2018  . Nausea & vomiting   . Acute  lower UTI 07/14/2016  . Panic disorder without agoraphobia with moderate panic attacks 01/20/2016  . Benzodiazepine abuse (HCC) 01/20/2016  . Substance induced mood disorder (HCC) 01/19/2016  . DKA 05/01/2011  . Type 1 diabetes mellitus with other specified complication (HCC) 05/01/2011  . Leukocytosis 05/01/2011   PCP:  [facility]:   [id number] - [place], [place] - [address] [place] [address] Phone: [phone] Fax: [phone]     Social Determinants of Health (SDOH) Interventions    Readmission Risk Interventions No flowsheet data found.

## 2021-03-12 DIAGNOSIS — E1011 Type 1 diabetes mellitus with ketoacidosis with coma: Secondary | ICD-10-CM | POA: Diagnosis not present

## 2021-03-12 DIAGNOSIS — E875 Hyperkalemia: Secondary | ICD-10-CM | POA: Diagnosis not present

## 2021-03-12 DIAGNOSIS — I5022 Chronic systolic (congestive) heart failure: Secondary | ICD-10-CM | POA: Diagnosis not present

## 2021-03-12 DIAGNOSIS — G9341 Metabolic encephalopathy: Secondary | ICD-10-CM

## 2021-03-12 DIAGNOSIS — K219 Gastro-esophageal reflux disease without esophagitis: Secondary | ICD-10-CM | POA: Diagnosis not present

## 2021-03-12 LAB — COMPREHENSIVE METABOLIC PANEL
ALT: 19 U/L (ref 0–44)
AST: 14 U/L — ABNORMAL LOW (ref 15–41)
Albumin: 2.4 g/dL — ABNORMAL LOW (ref 3.5–5.0)
Alkaline Phosphatase: 64 U/L (ref 38–126)
Anion gap: 7 (ref 5–15)
BUN: 12 mg/dL (ref 6–20)
CO2: 27 mmol/L (ref 22–32)
Calcium: 8.1 mg/dL — ABNORMAL LOW (ref 8.9–10.3)
Chloride: 98 mmol/L (ref 98–111)
Creatinine, Ser: 0.5 mg/dL (ref 0.44–1.00)
GFR, Estimated: >60 mL/min (ref >60)
Glucose, Bld: 376 mg/dL — ABNORMAL HIGH (ref 70–99)
Potassium: 4.2 mmol/L (ref 3.5–5.1)
Sodium: 132 mmol/L — ABNORMAL LOW (ref 135–145)
Total Bilirubin: 0.7 mg/dL (ref 0.3–1.2)
Total Protein: 5.3 g/dL — ABNORMAL LOW (ref 6.5–8.1)

## 2021-03-12 LAB — PHOSPHORUS: Phosphorus: 2.8 mg/dL (ref 2.5–4.6)

## 2021-03-12 LAB — MAGNESIUM: Magnesium: 1.8 mg/dL (ref 1.7–2.4)

## 2021-03-12 LAB — GLUCOSE, CAPILLARY
Glucose-Capillary: 291 mg/dL — ABNORMAL HIGH (ref 70–99)
Glucose-Capillary: 324 mg/dL — ABNORMAL HIGH (ref 70–99)
Glucose-Capillary: 351 mg/dL — ABNORMAL HIGH (ref 70–99)
Glucose-Capillary: 409 mg/dL — ABNORMAL HIGH (ref 70–99)

## 2021-03-12 MED ORDER — LANTUS SOLOSTAR 100 UNIT/ML ~~LOC~~ SOPN: 17.0000 [IU] | PEN_INJECTOR | Freq: Two times a day (BID) | SUBCUTANEOUS | 2 refills | Status: DC

## 2021-03-12 MED ORDER — NOVOLOG FLEXPEN 100 UNIT/ML ~~LOC~~ SOPN: 1.0000 [IU] | PEN_INJECTOR | Freq: Three times a day (TID) | SUBCUTANEOUS | 5 refills | Status: DC

## 2021-03-12 MED ORDER — PANTOPRAZOLE SODIUM 40 MG PO TBEC: 40.0000 mg | DELAYED_RELEASE_TABLET | Freq: Every day | ORAL | Status: DC

## 2021-03-12 MED ORDER — GABAPENTIN 300 MG PO CAPS: 1.0000 | ORAL_CAPSULE | Freq: Two times a day (BID) | ORAL | Status: DC

## 2021-03-12 MED ORDER — PANTOPRAZOLE SODIUM 40 MG PO TBEC: 40.0000 mg | DELAYED_RELEASE_TABLET | Freq: Every day | ORAL | 1 refills | Status: DC

## 2021-03-12 NOTE — Progress Notes (Signed)
Inpatient Diabetes Program Recommendations  AACE/ADA: New Consensus Statement on Inpatient Glycemic Control (2015)  Target Ranges:  Prepandial:   less than 140 mg/dL      Peak postprandial:   less than 180 mg/dL (1-2 hours)      Critically ill patients:  140 - 180 mg/dL   Lab Results  Component Value Date   GLUCAP 351 (H) 03/12/2021   HGBA1C 11.8 (H) 03/09/2021    Review of Glycemic Control  Diabetes history: DM1 Outpatient Diabetes medications: Lantus 30 QHS, Novolog 1-15 TID, CHO ratio 1:15 Current orders for Inpatient glycemic control: Novolog 0-6 units TID with meals and 0-5 HS + 2 units TID   HgbA1C - 11.8% Blood sugars in 300s this am Had severe hypo yesterday am Needs basal insulin as pt is Type 1 and makes no insulin  Inpatient Diabetes Program Recommendations:     Consider adding Lantus 6 units BID  Continue to follow.  Thank you. Ailene Ards, RD, LDN, CDE Inpatient Diabetes Coordinator [phone]

## 2021-03-12 NOTE — Discharge Summary (Signed)
Physician Discharge Summary  [name] XTG:[id number] DOB: Jun 27, 1986 DOA: 03/09/2021  PCP: [facility]  Admit date: 03/09/2021 Discharge date: 03/12/2021  Time spent: 35 minutes  Recommendations for Outpatient Follow-up:  1. Repeat basic metabolic panel and phosphorus level to assess electrolytes  and renal function stability. 2. Continue close monitoring of patient's CBGs with further adjustment on hypoglycemia regimen as required.   Discharge Diagnoses:  Active Problems:   Nausea & vomiting   Substance abuse (HCC)   Tobacco abuse   GERD (gastroesophageal reflux disease)   Congestive heart failure (CHF) (HCC)   DKA (diabetic ketoacidosis) (HCC)   Dehydration   Hyperkalemia   Metabolic encephalopathy   Discharge Condition: Stable and improved.  Discharged home with instruction to follow-up with PCP in 10 days.  CODE STATUS: Full code.  Diet recommendation: Heart healthy and modify carbohydrate diet.  Filed Weights   03/09/21 0211 03/09/21 1258  Weight: 43.1 kg 43.6 kg    History of present illness:  As per H&P written by Dr. Thomes Dinning on 03/09/2021 [name] a 35 y.o.femalewith medical history significant forGERD, type I diabetes mellitus and polysubstance abuse as well as systolic CHF with cardiomyopathy thought to be related to cocainewho presents to the emergency department due to increasing shortness of breath which worsens on exertion, she has not been using her insulin due to nausea. Patient was admitted from 2/14 -2/27 due to renal failure secondary to COVID-19 and superimposed bacterial pneumonia with MRSA sepsis. Patient was started on DKA protocol after central line was already placed, but she left AMA only to return within 1 to 2 hours.  Hospital Course:  1-type I DKA: In the setting of medication noncompliance and the use of cocaine. -UDS positive for cocaine. -No chest pain or acute ischemic abnormalities on EKG. -Patient DKA  successfully treated and patient transition of insulin drip; significant episode of hypoglycemia on 03/11/2021 triggering altered mental status changes.  Patient was observed for 24 more hours while cautiously following her CBGs and allow her to run on the high end; patient mentation back to baseline and no further episode of hypoglycemia appreciated. -Advised to maintain adequate hydration -Patient's long-term insulin therapy has been adjusted for better diabetes control -A1c 11.8 %  2-nausea and vomiting -In the setting of DKA -No further episode of nausea vomiting -Tolerating diet at discharge. -Continue PPI and medication compliance.  3-tobacco abuse -Cessation counseling provided -Patient declined nicotine patch.  4-hypernatremia and hypophosphatemia -In the setting of dehydration and electrolyte abnormality with hypoglycemic crisis -Hypernatremia essentially resolved/improved; phosphorus level repleted and is stable at discharge. -Repeat basic metabolic panel and phosphorus level at follow-up visit to assess stability.  5-gastroesophageal reflux disease -Continue PPI.  6-hyperkalemia -In the setting of metabolic acidosis from DKA -Resolved and with normal potassium level at discharge. -Repeat basic metabolic panel at follow-up visit to assess electrolytes stability.  7-history of systolic heart failure in the setting of nonischemic cardiomyopathy -Most recent ejection fraction February 2022 30-35% -No signs of fluid overload currently -Continue the use of Coreg and lisinopril -Resume home dose of Lasix. -Continue to follow daily weights, maintain adequate hydration and follow low-sodium diet.    Procedures:  See below for x-ray reports.  Consultations:  None  Discharge Exam: Vitals:   03/12/21 0256 03/12/21 0839  BP: 117/72 (!) 174/99  Pulse: 83   Resp: 19   Temp: 99.1 F (37.3 C)   SpO2: 90%     General: Alert, awake and oriented x3; no chest  pain, no  nausea, no vomiting.  Feeling ready to go home.  Expressed plans for medication compliance and close follow-up with PCP. Cardiovascular: S1 and S2, no rubs, no gallops, no JVD. Respiratory: No requiring oxygen supplementation; no wheezing or crackles on auscultation.  No using accessory muscle.  Good saturation on room air. Abdomen: Soft, nontender, positive bowel sounds Extremities: No cyanosis or clubbing.  Discharge Instructions   Discharge Instructions    Diet - low sodium heart healthy   Complete by: As directed    Discharge instructions   Complete by: As directed    -Stop the use of recreational drugs -Maintain adequate hydration -Follow modified carbohydrate diet -Be compliant with medications and insulin therapy. -Arrange follow-up with PCP in 10 days.     Allergies as of 03/12/2021      Reactions   Codeine Anaphylaxis, Nausea And Vomiting   Penicillins Rash   As a teenager   Tramadol Itching      Medication List    STOP taking these medications   NovoLOG Mix 70/30 FlexPen (70-30) 100 UNIT/ML FlexPen Generic drug: insulin aspart protamine - aspart     TAKE these medications   carvedilol 6.25 MG tablet Commonly known as: COREG Take 1 tablet (6.25 mg total) by mouth 2 (two) times daily with a meal. Increased from 3.125 mg.   furosemide 20 MG tablet Commonly known as: Lasix Take 2 tablets (40 mg total) by mouth daily. Increased from 20 mg daily.   gabapentin 300 MG capsule Commonly known as: NEURONTIN Take 1 capsule (300 mg total) by mouth 2 (two) times daily. What changed:   when to take this  reasons to take this   Lantus SoloStar 100 UNIT/ML Solostar Pen Generic drug: insulin glargine Inject 17 Units into the skin 2 (two) times daily. What changed:   how much to take  when to take this   lisinopril 5 MG tablet Commonly known as: ZESTRIL Take 1 tablet (5 mg total) by mouth daily. New medication for your heart.   multivitamin tablet Take 1  tablet by mouth daily.   NovoLOG FlexPen 100 UNIT/ML FlexPen Generic drug: insulin aspart Inject 1-15 Units into the skin 3 (three) times daily with meals. Dose based on CBG and carb count, 1 unit for every 15 carb.   omeprazole 40 MG capsule Commonly known as: PRILOSEC Take 40 mg by mouth daily.   ondansetron 4 MG tablet Commonly known as: ZOFRAN Take 8 mg by mouth 2 (two) times daily as needed for nausea/vomiting.   pantoprazole 40 MG tablet Commonly known as: PROTONIX Take 1 tablet (40 mg total) by mouth daily. Start taking on: Mar 13, 2021   triamcinolone cream 0.1 % Commonly known as: KENALOG Apply topically.      Allergies  Allergen Reactions  . Codeine Anaphylaxis and Nausea And Vomiting  . Penicillins Rash    As a teenager  . Tramadol Itching    Follow-up Information    [facility]. Schedule an appointment as soon as possible for a visit in 10 day(s).   Contact information: [address] [place] [address] [phone]                The results of significant diagnostics from this hospitalization (including imaging, microbiology, ancillary and laboratory) are listed below for reference.    Significant Diagnostic Studies: DG Chest 1 View  Result Date: 03/08/2021 CLINICAL DATA:  Central venous catheter placement EXAM: CHEST  1 VIEW COMPARISON:  03/08/2021 FINDINGS: Right internal jugular central venous catheter tip noted within the superior vena cava. Lungs are clear. No pneumothorax or pleural effusion. Cardiac size within normal limits. The pulmonary vascularity is normal. IMPRESSION: Right internal jugular central venous catheter tip within the superior vena cava. No pneumothorax. Electronically Signed   By: Helyn Numbers MD   On: 03/08/2021 22:39   DG Chest 2 View  Result Date: 03/08/2021 CLINICAL DATA:  Shortness of breath for 2 days, history hypertension, CHF, smoker; was COVID-19 positive with severe MRSA shock in February  2022 EXAM: CHEST - 2 VIEW COMPARISON:  12/28/2020 FINDINGS: Normal heart size, mediastinal contours, and pulmonary vascularity. Lungs slightly hyperinflated with small residual RIGHT pleural effusion. Extensive infiltrates seen on previous exam resolved. Remaining lungs clear. No pneumothorax or acute osseous findings. IMPRESSION: Mildly hyperinflated lungs with resolved pulmonary infiltrates and small residual RIGHT pleural effusion. Electronically Signed   By: Ulyses Southward M.D.   On: 03/08/2021 18:48    Microbiology: Recent Results (from the past 240 hour(s))  Resp Panel by RT-PCR (Flu A&B, Covid) Nasopharyngeal Swab     Status: None   Collection Time: 03/09/21  4:06 AM   Specimen: Nasopharyngeal Swab; Nasopharyngeal(NP) swabs in vial transport medium  Result Value Ref Range Status   SARS Coronavirus 2 by RT PCR NEGATIVE NEGATIVE Final    Comment: (NOTE) SARS-CoV-2 target nucleic acids are NOT DETECTED.  The SARS-CoV-2 RNA is generally detectable in upper respiratory specimens during the acute phase of infection. The lowest concentration of SARS-CoV-2 viral copies this assay can detect is 138 copies/mL. A negative result does not preclude SARS-Cov-2 infection and should not be used as the sole basis for treatment or other patient management decisions. A negative result may occur with  improper specimen collection/handling, submission of specimen other than nasopharyngeal swab, presence of viral mutation(s) within the areas targeted by this assay, and inadequate number of viral copies(<138 copies/mL). A negative result must be combined with clinical observations, patient history, and epidemiological information. The expected result is Negative.  Fact Sheet for Patients:  BloggerCourse.com  Fact Sheet for Healthcare Providers:  SeriousBroker.it  This test is no t yet approved or cleared by the [place] FDA and  has been authorized  for detection and/or diagnosis of SARS-CoV-2 by FDA under an Emergency Use Authorization (EUA). This EUA will remain  in effect (meaning this test can be used) for the duration of the COVID-19 declaration under Section 564(b)(1) of the Act, 21 U.S.C.section 360bbb-3(b)(1), unless the authorization is terminated  or revoked sooner.       Influenza A by PCR NEGATIVE NEGATIVE Final   Influenza B by PCR NEGATIVE NEGATIVE Final    Comment: (NOTE) The Xpert Xpress SARS-CoV-2/FLU/RSV plus assay is intended as an aid in the diagnosis of influenza from Nasopharyngeal swab specimens and should not be used as a sole basis for treatment. Nasal washings and aspirates are unacceptable for Xpert Xpress SARS-CoV-2/FLU/RSV testing.  Fact Sheet for Patients: BloggerCourse.com  Fact Sheet for Healthcare Providers: SeriousBroker.it  This test is not yet approved or cleared by the [place] FDA and has been authorized for detection and/or diagnosis of SARS-CoV-2 by FDA under an Emergency Use Authorization (EUA). This EUA will remain in effect (meaning this test can be used) for the duration of the COVID-19 declaration under Section 564(b)(1) of the Act, 21 U.S.C. section 360bbb-3(b)(1), unless the authorization is terminated or revoked.  Performed at [facility], [address], [place], [place] [address]  MRSA PCR Screening     Status: Abnormal   Collection Time: 03/09/21  1:05 PM   Specimen: Nasopharyngeal  Result Value Ref Range Status   MRSA by PCR POSITIVE (A) NEGATIVE Final    Comment:        The GeneXpert MRSA Assay (FDA approved for NASAL specimens only), is one component of a comprehensive MRSA colonization surveillance program. It is not intended to diagnose MRSA infection nor to guide or monitor treatment for MRSA infections. RESULT CALLED TO, READ BACK BY AND VERIFIED WITH: SHELTON,ASHLEY @1606  03/09/21 BY  JONES,T Performed at [facility], [address], [place], [place]      Labs: Basic Metabolic Panel: Recent Labs  Lab 03/09/21 0431 03/09/21 0539 03/09/21 2100 03/10/21 0125 03/10/21 0443 03/11/21 0458 03/11/21 1132 03/12/21 0502  NA 137   < > 144 146* 147*  --  135 132*  K 5.4*   < > 3.7 3.5 3.1*  --  3.9 4.2  CL 104   < > 113* 115* 114*  --  99 98  CO2 8*   < > 26 27 27   --  30 27  GLUCOSE 509*   < > 196* 171* 145*  --  309* 376*  BUN 34*   < > 19 17 17   --  7 12  CREATININE 1.30*   < > 0.69 0.67 0.59  --  0.39* 0.50  CALCIUM 9.0   < > 8.7* 8.8* 8.9  --  7.6* 8.1*  MG 1.9  --   --   --  1.8  --  1.4* 1.8  PHOS 3.2  --   --   --  <1.0* 2.0*  --  2.8   < > = values in this interval not displayed.   Liver Function Tests: Recent Labs  Lab 03/09/21 0431 03/10/21 0443 03/12/21 0502  AST 34 24 14*  ALT 17 14 19   ALKPHOS 77 67 64  BILITOT 1.9* 0.3 0.7  PROT 6.7 5.5* 5.3*  ALBUMIN 3.5 2.7* 2.4*   CBC: Recent Labs  Lab 03/08/21 1951 03/09/21 0431 03/09/21 0539 03/10/21 0443  WBC 29.2* 28.9*  --  17.2*  HGB 14.9 13.1 13.9 11.7*  HCT 51.0* 41.0 41.0 35.3*  MCV 97.5 90.5  --  85.5  PLT 394 305  --  217   BNP (last 3 results) Recent Labs    12/28/20 0301 03/08/21 1951  BNP 359.6* 94.0   CBG: Recent Labs  Lab 03/11/21 2355 03/12/21 0308 03/12/21 0356 03/12/21 0733 03/12/21 1109  GLUCAP 199* 291* 324* 351* 409*    Signed:  03/14/21 MD.  [facility] 03/12/2021, 12:51 PM

## 2021-03-12 NOTE — Plan of Care (Signed)

## 2021-03-16 LAB — GLUCOSE, CAPILLARY
Glucose-Capillary: <10 mg/dL — CL (ref 70–99)
Glucose-Capillary: <10 mg/dL — CL (ref 70–99)

## 2021-04-15 ENCOUNTER — Encounter: Payer: Self-pay | Admitting: Endocrinology

## 2021-10-30 ENCOUNTER — Emergency Department (HOSPITAL_COMMUNITY): Payer: Medicaid Other

## 2021-10-30 ENCOUNTER — Inpatient Hospital Stay (HOSPITAL_COMMUNITY)
Admission: EM | Admit: 2021-10-30 | Discharge: 2021-11-01 | DRG: 638 | Disposition: A | Discharge status: Home / Self Care | Payer: Medicaid Other | Attending: Internal Medicine | Admitting: Internal Medicine

## 2021-10-30 ENCOUNTER — Encounter (HOSPITAL_COMMUNITY): Payer: Self-pay | Admitting: *Deleted

## 2021-10-30 ENCOUNTER — Other Ambulatory Visit: Payer: Self-pay

## 2021-10-30 DIAGNOSIS — Z833 Family history of diabetes mellitus: Secondary | ICD-10-CM | POA: Diagnosis not present

## 2021-10-30 DIAGNOSIS — B9620 Unspecified Escherichia coli [E. coli] as the cause of diseases classified elsewhere: Secondary | ICD-10-CM | POA: Diagnosis present

## 2021-10-30 DIAGNOSIS — I110 Hypertensive heart disease with heart failure: Secondary | ICD-10-CM | POA: Diagnosis present

## 2021-10-30 DIAGNOSIS — F1410 Cocaine abuse, uncomplicated: Secondary | ICD-10-CM | POA: Diagnosis present

## 2021-10-30 DIAGNOSIS — Z801 Family history of malignant neoplasm of trachea, bronchus and lung: Secondary | ICD-10-CM | POA: Diagnosis not present

## 2021-10-30 DIAGNOSIS — I5022 Chronic systolic (congestive) heart failure: Secondary | ICD-10-CM | POA: Diagnosis not present

## 2021-10-30 DIAGNOSIS — Z20822 Contact with and (suspected) exposure to covid-19: Secondary | ICD-10-CM | POA: Diagnosis present

## 2021-10-30 DIAGNOSIS — D72823 Leukemoid reaction: Secondary | ICD-10-CM | POA: Diagnosis present

## 2021-10-30 DIAGNOSIS — Z9114 Patient's other noncompliance with medication regimen: Secondary | ICD-10-CM | POA: Diagnosis not present

## 2021-10-30 DIAGNOSIS — Z8249 Family history of ischemic heart disease and other diseases of the circulatory system: Secondary | ICD-10-CM | POA: Diagnosis not present

## 2021-10-30 DIAGNOSIS — E1010 Type 1 diabetes mellitus with ketoacidosis without coma: Secondary | ICD-10-CM | POA: Diagnosis present

## 2021-10-30 DIAGNOSIS — E869 Volume depletion, unspecified: Secondary | ICD-10-CM | POA: Diagnosis present

## 2021-10-30 DIAGNOSIS — Z720 Tobacco use: Secondary | ICD-10-CM | POA: Diagnosis present

## 2021-10-30 DIAGNOSIS — R7989 Other specified abnormal findings of blood chemistry: Secondary | ICD-10-CM | POA: Diagnosis not present

## 2021-10-30 DIAGNOSIS — E871 Hypo-osmolality and hyponatremia: Secondary | ICD-10-CM | POA: Diagnosis present

## 2021-10-30 DIAGNOSIS — K219 Gastro-esophageal reflux disease without esophagitis: Secondary | ICD-10-CM | POA: Diagnosis present

## 2021-10-30 DIAGNOSIS — E109 Type 1 diabetes mellitus without complications: Secondary | ICD-10-CM | POA: Diagnosis present

## 2021-10-30 DIAGNOSIS — I5021 Acute systolic (congestive) heart failure: Secondary | ICD-10-CM

## 2021-10-30 DIAGNOSIS — I5042 Chronic combined systolic (congestive) and diastolic (congestive) heart failure: Secondary | ICD-10-CM | POA: Diagnosis present

## 2021-10-30 DIAGNOSIS — T383X6A Underdosing of insulin and oral hypoglycemic [antidiabetic] drugs, initial encounter: Secondary | ICD-10-CM | POA: Diagnosis present

## 2021-10-30 DIAGNOSIS — N390 Urinary tract infection, site not specified: Secondary | ICD-10-CM | POA: Diagnosis present

## 2021-10-30 DIAGNOSIS — F17210 Nicotine dependence, cigarettes, uncomplicated: Secondary | ICD-10-CM | POA: Diagnosis present

## 2021-10-30 DIAGNOSIS — N179 Acute kidney failure, unspecified: Secondary | ICD-10-CM | POA: Diagnosis present

## 2021-10-30 DIAGNOSIS — Z794 Long term (current) use of insulin: Secondary | ICD-10-CM

## 2021-10-30 LAB — CBG MONITORING, ED
Glucose-Capillary: >600 mg/dL (ref 70–99)
Glucose-Capillary: 594 mg/dL (ref 70–99)
Glucose-Capillary: 558 mg/dL (ref 70–99)

## 2021-10-30 LAB — URINALYSIS, MICROSCOPIC (REFLEX): WBC, UA: >50 WBC/hpf (ref 0–5)

## 2021-10-30 LAB — GLUCOSE, CAPILLARY
Glucose-Capillary: 555 mg/dL (ref 70–99)
Glucose-Capillary: 458 mg/dL — ABNORMAL HIGH (ref 70–99)
Glucose-Capillary: 410 mg/dL — ABNORMAL HIGH (ref 70–99)
Glucose-Capillary: 280 mg/dL — ABNORMAL HIGH (ref 70–99)
Glucose-Capillary: 265 mg/dL — ABNORMAL HIGH (ref 70–99)
Glucose-Capillary: 246 mg/dL — ABNORMAL HIGH (ref 70–99)
Glucose-Capillary: 222 mg/dL — ABNORMAL HIGH (ref 70–99)
Glucose-Capillary: 180 mg/dL — ABNORMAL HIGH (ref 70–99)

## 2021-10-30 LAB — BASIC METABOLIC PANEL
Anion gap: 32 — ABNORMAL HIGH (ref 5–15)
Anion gap: 9 (ref 5–15)
BUN: 23 mg/dL — ABNORMAL HIGH (ref 6–20)
BUN: 20 mg/dL (ref 6–20)
CO2: 11 mmol/L — ABNORMAL LOW (ref 22–32)
CO2: 26 mmol/L (ref 22–32)
Calcium: 9.1 mg/dL (ref 8.9–10.3)
Calcium: 8.5 mg/dL — ABNORMAL LOW (ref 8.9–10.3)
Chloride: 90 mmol/L — ABNORMAL LOW (ref 98–111)
Chloride: 101 mmol/L (ref 98–111)
Creatinine, Ser: 1.44 mg/dL — ABNORMAL HIGH (ref 0.44–1.00)
Creatinine, Ser: 1.1 mg/dL — ABNORMAL HIGH (ref 0.44–1.00)
GFR, Estimated: 48 mL/min — ABNORMAL LOW (ref >60)
GFR, Estimated: >60 mL/min (ref >60)
Glucose, Bld: 663 mg/dL (ref 70–99)
Glucose, Bld: 298 mg/dL — ABNORMAL HIGH (ref 70–99)
Potassium: 4.7 mmol/L (ref 3.5–5.1)
Potassium: 3.5 mmol/L (ref 3.5–5.1)
Sodium: 133 mmol/L — ABNORMAL LOW (ref 135–145)
Sodium: 136 mmol/L (ref 135–145)

## 2021-10-30 LAB — MRSA NEXT GEN BY PCR, NASAL: MRSA by PCR Next Gen: DETECTED — AB

## 2021-10-30 LAB — CBC WITH DIFFERENTIAL/PLATELET
Abs Immature Granulocytes: 0.05 10*3/uL (ref 0.00–0.07)
Basophils Absolute: 0.1 10*3/uL (ref 0.0–0.1)
Basophils Relative: 0 %
Eosinophils Absolute: 0 10*3/uL (ref 0.0–0.5)
Eosinophils Relative: 0 %
HCT: 47.5 % — ABNORMAL HIGH (ref 36.0–46.0)
Hemoglobin: 14.8 g/dL (ref 12.0–15.0)
Immature Granulocytes: 0 %
Lymphocytes Relative: 12 %
Lymphs Abs: 1.7 10*3/uL (ref 0.7–4.0)
MCH: 27.4 pg (ref 26.0–34.0)
MCHC: 31.2 g/dL (ref 30.0–36.0)
MCV: 88 fL (ref 80.0–100.0)
Monocytes Absolute: 0.7 10*3/uL (ref 0.1–1.0)
Monocytes Relative: 5 %
Neutro Abs: 11.2 10*3/uL — ABNORMAL HIGH (ref 1.7–7.7)
Neutrophils Relative %: 83 %
Platelets: 350 10*3/uL (ref 150–400)
RBC: 5.4 MIL/uL — ABNORMAL HIGH (ref 3.87–5.11)
RDW: 19 % — ABNORMAL HIGH (ref 11.5–15.5)
WBC: 13.7 10*3/uL — ABNORMAL HIGH (ref 4.0–10.5)
nRBC: 0 % (ref 0.0–0.2)

## 2021-10-30 LAB — BETA-HYDROXYBUTYRIC ACID
Beta-Hydroxybutyric Acid: >8 mmol/L — ABNORMAL HIGH (ref 0.05–0.27)
Beta-Hydroxybutyric Acid: >8 mmol/L — ABNORMAL HIGH (ref 0.05–0.27)
Beta-Hydroxybutyric Acid: 2.8 mmol/L — ABNORMAL HIGH (ref 0.05–0.27)

## 2021-10-30 LAB — I-STAT CHEM 8, ED
BUN: 24 mg/dL — ABNORMAL HIGH (ref 6–20)
Calcium, Ion: 1.06 mmol/L — ABNORMAL LOW (ref 1.15–1.40)
Chloride: 94 mmol/L — ABNORMAL LOW (ref 98–111)
Creatinine, Ser: 1.2 mg/dL — ABNORMAL HIGH (ref 0.44–1.00)
Glucose, Bld: 698 mg/dL (ref 70–99)
HCT: 50 % — ABNORMAL HIGH (ref 36.0–46.0)
Hemoglobin: 17 g/dL — ABNORMAL HIGH (ref 12.0–15.0)
Potassium: 4.6 mmol/L (ref 3.5–5.1)
Sodium: 130 mmol/L — ABNORMAL LOW (ref 135–145)
TCO2: 15 mmol/L — ABNORMAL LOW (ref 22–32)

## 2021-10-30 LAB — COMPREHENSIVE METABOLIC PANEL
ALT: 19 U/L (ref 0–44)
AST: 18 U/L (ref 15–41)
Albumin: 3.8 g/dL (ref 3.5–5.0)
Alkaline Phosphatase: 81 U/L (ref 38–126)
Anion gap: 32 — ABNORMAL HIGH (ref 5–15)
BUN: 22 mg/dL — ABNORMAL HIGH (ref 6–20)
CO2: 13 mmol/L — ABNORMAL LOW (ref 22–32)
Calcium: 9.3 mg/dL (ref 8.9–10.3)
Chloride: 87 mmol/L — ABNORMAL LOW (ref 98–111)
Creatinine, Ser: 1.61 mg/dL — ABNORMAL HIGH (ref 0.44–1.00)
GFR, Estimated: 42 mL/min — ABNORMAL LOW (ref >60)
Glucose, Bld: 692 mg/dL (ref 70–99)
Potassium: 4.1 mmol/L (ref 3.5–5.1)
Sodium: 132 mmol/L — ABNORMAL LOW (ref 135–145)
Total Bilirubin: 2.1 mg/dL — ABNORMAL HIGH (ref 0.3–1.2)
Total Protein: 7.6 g/dL (ref 6.5–8.1)

## 2021-10-30 LAB — RESP PANEL BY RT-PCR (FLU A&B, COVID) ARPGX2
Influenza A by PCR: NEGATIVE
Influenza B by PCR: NEGATIVE
SARS Coronavirus 2 by RT PCR: NEGATIVE

## 2021-10-30 LAB — BLOOD GAS, VENOUS
Acid-base deficit: 12.4 mmol/L — ABNORMAL HIGH (ref 0.0–2.0)
Bicarbonate: 14.5 mmol/L — ABNORMAL LOW (ref 20.0–28.0)
Drawn by: 1580
FIO2: 21
O2 Saturation: 65.6 %
Patient temperature: 36.6
pCO2, Ven: 29.5 mmHg — ABNORMAL LOW (ref 44.0–60.0)
pH, Ven: 7.272 (ref 7.250–7.430)
pO2, Ven: 42.7 mmHg (ref 32.0–45.0)

## 2021-10-30 LAB — URINALYSIS, ROUTINE W REFLEX MICROSCOPIC
Bilirubin Urine: NEGATIVE
Glucose, UA: >=500 mg/dL — AB
Ketones, ur: >80 mg/dL — AB
Nitrite: NEGATIVE
Protein, ur: NEGATIVE mg/dL
Specific Gravity, Urine: 1.02 (ref 1.005–1.030)
pH: 5.5 (ref 5.0–8.0)

## 2021-10-30 LAB — I-STAT BETA HCG BLOOD, ED (MC, WL, AP ONLY): I-stat hCG, quantitative: <5 m[IU]/mL (ref <5)

## 2021-10-30 LAB — RAPID URINE DRUG SCREEN, HOSP PERFORMED
Amphetamines: NOT DETECTED
Barbiturates: NOT DETECTED
Benzodiazepines: NOT DETECTED
Cocaine: POSITIVE — AB
Opiates: NOT DETECTED
Tetrahydrocannabinol: NOT DETECTED

## 2021-10-30 LAB — HEMOGLOBIN A1C
Hgb A1c MFr Bld: 9 % — ABNORMAL HIGH (ref 4.8–5.6)
Mean Plasma Glucose: 211.6 mg/dL

## 2021-10-30 LAB — PROCALCITONIN: Procalcitonin: 0.13 ng/mL

## 2021-10-30 LAB — HIV ANTIBODY (ROUTINE TESTING W REFLEX): HIV Screen 4th Generation wRfx: NONREACTIVE

## 2021-10-30 MED ORDER — ACETAMINOPHEN 325 MG PO TABS: 650.0000 mg | ORAL_TABLET | Freq: Four times a day (QID) | ORAL | Status: DC | PRN

## 2021-10-30 MED ORDER — INSULIN REGULAR(HUMAN) IN NACL 100-0.9 UT/100ML-% IV SOLN
INTRAVENOUS | Status: DC
  Administered 2021-10-30: 5 [IU]/h via INTRAVENOUS
  Filled 2021-10-30: qty 100

## 2021-10-30 MED ORDER — INSULIN REGULAR(HUMAN) IN NACL 100-0.9 UT/100ML-% IV SOLN: INTRAVENOUS | Status: DC

## 2021-10-30 MED ORDER — DEXTROSE 50 % IV SOLN: 0.0000 mL | INTRAVENOUS | Status: DC | PRN

## 2021-10-30 MED ORDER — PANTOPRAZOLE SODIUM 40 MG IV SOLR
40.0000 mg | Freq: Two times a day (BID) | INTRAVENOUS | Status: DC
  Administered 2021-10-30 – 2021-11-01 (×4): 40 mg via INTRAVENOUS
  Filled 2021-10-30 (×4): qty 40

## 2021-10-30 MED ORDER — DEXTROSE IN LACTATED RINGERS 5 % IV SOLN: INTRAVENOUS | Status: DC

## 2021-10-30 MED ORDER — LACTATED RINGERS IV BOLUS
500.0000 mL | Freq: Once | INTRAVENOUS | Status: AC
  Administered 2021-10-30: 500 mL via INTRAVENOUS

## 2021-10-30 MED ORDER — SODIUM CHLORIDE 0.9 % IV SOLN
12.5000 mg | Freq: Four times a day (QID) | INTRAVENOUS | Status: DC | PRN
  Administered 2021-11-01: 12.5 mg via INTRAVENOUS
  Filled 2021-10-30: qty 0.5

## 2021-10-30 MED ORDER — METOCLOPRAMIDE HCL 5 MG/ML IJ SOLN
5.0000 mg | Freq: Once | INTRAMUSCULAR | Status: AC
  Administered 2021-10-30: 5 mg via INTRAVENOUS
  Filled 2021-10-30: qty 2

## 2021-10-30 MED ORDER — LACTATED RINGERS IV SOLN: INTRAVENOUS | Status: DC

## 2021-10-30 MED ORDER — METOCLOPRAMIDE HCL 5 MG/ML IJ SOLN: 10.0000 mg | Freq: Once | INTRAMUSCULAR | Status: DC

## 2021-10-30 MED ORDER — POTASSIUM CHLORIDE 10 MEQ/100ML IV SOLN
10.0000 meq | INTRAVENOUS | Status: AC
  Administered 2021-10-30 (×2): 10 meq via INTRAVENOUS
  Filled 2021-10-30 (×2): qty 100

## 2021-10-30 MED ORDER — CARVEDILOL 3.125 MG PO TABS
6.2500 mg | ORAL_TABLET | Freq: Two times a day (BID) | ORAL | Status: DC
  Administered 2021-10-30 – 2021-11-01 (×4): 6.25 mg via ORAL
  Filled 2021-10-30 (×4): qty 2

## 2021-10-30 MED ORDER — LACTATED RINGERS IV BOLUS: 1000.0000 mL | Freq: Once | INTRAVENOUS | Status: DC

## 2021-10-30 MED ORDER — POTASSIUM CHLORIDE 10 MEQ/100ML IV SOLN: 10.0000 meq | INTRAVENOUS | Status: DC

## 2021-10-30 MED ORDER — ONDANSETRON HCL 4 MG/2ML IJ SOLN
4.0000 mg | Freq: Four times a day (QID) | INTRAMUSCULAR | Status: DC
  Administered 2021-10-30 – 2021-11-01 (×8): 4 mg via INTRAVENOUS
  Filled 2021-10-30 (×8): qty 2

## 2021-10-30 MED ORDER — ONDANSETRON HCL 4 MG/2ML IJ SOLN
4.0000 mg | Freq: Once | INTRAMUSCULAR | Status: AC
Start: 2021-10-30 — End: 2021-10-30
  Administered 2021-10-30: 4 mg via INTRAVENOUS
  Filled 2021-10-30: qty 2

## 2021-10-30 MED ORDER — MUPIROCIN 2 % EX OINT
TOPICAL_OINTMENT | Freq: Two times a day (BID) | CUTANEOUS | Status: DC
  Filled 2021-10-30: qty 22

## 2021-10-30 MED ORDER — ENOXAPARIN SODIUM 40 MG/0.4ML IJ SOSY
40.0000 mg | PREFILLED_SYRINGE | INTRAMUSCULAR | Status: DC
  Administered 2021-10-30 – 2021-10-31 (×2): 40 mg via SUBCUTANEOUS
  Filled 2021-10-30 (×2): qty 0.4

## 2021-10-30 NOTE — ED Notes (Addendum)
Date and time results received: 10/30/21 2:05 PM    Test: Glucose Critical Value: 692  Name of Provider Notified: Dr. Criss Alvine  Orders Received? Or Actions Taken?: see orders

## 2021-10-30 NOTE — ED Provider Notes (Signed)
[facility] EMERGENCY DEPARTMENT Provider Note   CSN: [id number] Arrival date & time: 10/30/21  1211     History  Chief Complaint  Patient presents with   Hyperglycemia    [name] is a 36 y.o. female.  HPI 36 year old female presents with hyperglycemia and being out of her insulin.  She is a type I diabetic.  She also has a history of systolic heart failure with the most recent EF of around 30-35%.  She reports nausea, vomiting, and generalized abdominal discomfort.  She is also feeling she is having a harder time breathing.  This is all very similar to previous ketoacidosis episodes.  She has been out of her insulin for about 3 days.  She denies fevers or cough.    Home Medications Prior to Admission medications   Medication Sig Start Date End Date Taking? Authorizing Provider  insulin aspart (NOVOLOG FLEXPEN) 100 UNIT/ML FlexPen Inject 1-15 Units into the skin 3 (three) times daily with meals. Dose based on CBG and carb count, 1 unit for every 15 carb. 03/12/21  Yes Vassie Loll, MD  carvedilol (COREG) 6.25 MG tablet Take 1 tablet (6.25 mg total) by mouth 2 (two) times daily with a meal. Increased from 3.125 mg. Patient not taking: Reported on 10/30/2021 12/31/20 03/31/21  Darlin Priestly, MD  furosemide (LASIX) 20 MG tablet Take 2 tablets (40 mg total) by mouth daily. Increased from 20 mg daily. Patient not taking: Reported on 10/30/2021 12/31/20 03/31/21  Darlin Priestly, MD  gabapentin (NEURONTIN) 300 MG capsule Take 1 capsule (300 mg total) by mouth 2 (two) times daily. Patient not taking: Reported on 10/30/2021 03/12/21   Vassie Loll, MD  insulin glargine (LANTUS SOLOSTAR) 100 UNIT/ML Solostar Pen Inject 17 Units into the skin 2 (two) times daily. Patient not taking: Reported on 10/30/2021 03/12/21 03/07/22  Vassie Loll, MD  lisinopril (ZESTRIL) 5 MG tablet Take 1 tablet (5 mg total) by mouth daily. New medication for your heart. Patient not taking: Reported on 10/30/2021 01/01/21  04/01/21  Darlin Priestly, MD  pantoprazole (PROTONIX) 40 MG tablet Take 1 tablet (40 mg total) by mouth daily. Patient not taking: Reported on 10/30/2021 03/13/21   Vassie Loll, MD      Allergies    Codeine, Penicillins, and Tramadol    Review of Systems   Review of Systems  Constitutional:  Negative for fever.  Respiratory:  Positive for shortness of breath. Negative for cough.   Cardiovascular:  Negative for chest pain.  Gastrointestinal:  Positive for abdominal pain, nausea and vomiting.   Physical Exam Updated Vital Signs BP 130/77    Pulse (!) 103    Temp 97.8 F (36.6 C) (Oral)    Resp (!) 22    Ht 5' (1.524 m)    Wt 49.9 kg    LMP 10/16/2021    SpO2 100%    BMI 21.48 kg/m  Physical Exam Vitals and nursing note reviewed.  Constitutional:      Appearance: She is well-developed.  HENT:     Head: Normocephalic and atraumatic.     Mouth/Throat:     Mouth: Mucous membranes are dry.  Cardiovascular:     Rate and Rhythm: Regular rhythm. Tachycardia present.     Heart sounds: Normal heart sounds.  Pulmonary:     Effort: Pulmonary effort is normal.     Breath sounds: Normal breath sounds.  Abdominal:     General: There is no distension.     Palpations: Abdomen is soft.  Tenderness: There is abdominal tenderness.     Comments: Perhaps mild, non-focal tenderness  Skin:    General: Skin is warm and dry.  Neurological:     Mental Status: She is alert.   ED Results / Procedures / Treatments   Labs (all labs ordered are listed, but only abnormal results are displayed) Labs Reviewed  COMPREHENSIVE METABOLIC PANEL - Abnormal; Notable for the following components:      Result Value   Sodium 132 (*)    Chloride 87 (*)    CO2 13 (*)    Glucose, Bld 692 (*)    BUN 22 (*)    Creatinine, Ser 1.61 (*)    Total Bilirubin 2.1 (*)    GFR, Estimated 42 (*)    Anion gap 32 (*)    All other components within normal limits  CBC WITH DIFFERENTIAL/PLATELET - Abnormal; Notable for the  following components:   WBC 13.7 (*)    RBC 5.40 (*)    HCT 47.5 (*)    RDW 19.0 (*)    Neutro Abs 11.2 (*)    All other components within normal limits  BLOOD GAS, VENOUS - Abnormal; Notable for the following components:   pCO2, Ven 29.5 (*)    Bicarbonate 14.5 (*)    Acid-base deficit 12.4 (*)    All other components within normal limits  BETA-HYDROXYBUTYRIC ACID - Abnormal; Notable for the following components:   Beta-Hydroxybutyric Acid >8.00 (*)    All other components within normal limits  CBG MONITORING, ED - Abnormal; Notable for the following components:   Glucose-Capillary >600 (*)    All other components within normal limits  I-STAT CHEM 8, ED - Abnormal; Notable for the following components:   Sodium 130 (*)    Chloride 94 (*)    BUN 24 (*)    Creatinine, Ser 1.20 (*)    Glucose, Bld 698 (*)    Calcium, Ion 1.06 (*)    TCO2 15 (*)    Hemoglobin 17.0 (*)    HCT 50.0 (*)    All other components within normal limits  RESP PANEL BY RT-PCR (FLU A&B, COVID) ARPGX2  URINALYSIS, ROUTINE W REFLEX MICROSCOPIC  BASIC METABOLIC PANEL  BASIC METABOLIC PANEL  BASIC METABOLIC PANEL  BETA-HYDROXYBUTYRIC ACID  I-STAT BETA HCG BLOOD, ED ([facility], [facility], [facility] ONLY)  POC URINE PREG, ED    EKG EKG Interpretation  Date/Time:  Saturday October 30 2021 12:58:15 EST Ventricular Rate:  104 PR Interval:  116 QRS Duration: 91 QT Interval:  359 QTC Calculation: 473 R Axis:   80 Text Interpretation: Sinus tachycardia Consider left ventricular hypertrophy Confirmed by Pricilla Loveless [phone]) on 10/30/2021 1:11:51 PM  Radiology DG Chest Portable 1 View  Result Date: 10/30/2021 CLINICAL DATA:  Dyspnea, DKA EXAM: PORTABLE CHEST 1 VIEW COMPARISON:  03/08/2021 FINDINGS: The heart size and mediastinal contours are within normal limits. Bilateral nipple shadows are again seen. Both lungs are clear. The visualized skeletal structures are unremarkable. IMPRESSION: No active disease. Electronically  Signed   By: Duanne Guess D.O.   On: 10/30/2021 13:49    Procedures .Critical Care Performed by: Pricilla Loveless, MD Authorized by: Pricilla Loveless, MD   Critical care provider statement:    Critical care time (minutes):  35   Critical care time was exclusive of:  Separately billable procedures and treating other patients   Critical care was time spent personally by me on the following activities:  Development of treatment plan with patient  or surrogate, discussions with consultants, evaluation of patient's response to treatment, examination of patient, ordering and review of laboratory studies, ordering and review of radiographic studies, ordering and performing treatments and interventions, pulse oximetry, re-evaluation of patient's condition and review of old charts    Medications Ordered in ED Medications  insulin regular, human (MYXREDLIN) 100 units/ 100 mL infusion (has no administration in time range)  dextrose 5 % in lactated ringers infusion (has no administration in time range)  dextrose 50 % solution 0-50 mL (has no administration in time range)  potassium chloride 10 mEq in 100 mL IVPB (10 mEq Intravenous New Bag/Given 10/30/21 1411)  lactated ringers infusion ( Intravenous New Bag/Given 10/30/21 1405)  ondansetron (ZOFRAN) injection 4 mg (4 mg Intravenous Given 10/30/21 1408)  lactated ringers bolus 500 mL (500 mLs Intravenous New Bag/Given 10/30/21 1404)  lactated ringers bolus 500 mL (500 mLs Intravenous New Bag/Given 10/30/21 1405)    ED Course/ Medical Decision Making/ A&P                           Medical Decision Making  Patient presents with recurrent DKA.  She is overall dehydrated and mildly ill-appearing.  However her vitals are okay besides some tachycardia.  I have reviewed her most recent admission/discharge back in May 2022 and she seems to have a similar presentation this time where she has not been taking her insulin.  I doubt another cause of her acute  endocrine emergency.  Labs have been personally reviewed and interpreted and show a metabolic acidosis with an acute kidney injury related to her normal baseline.  Significant hyperglycemia.  She has a leukocytosis that is probably reactive and not as severe as in the past.  I have low suspicion for an acute abdominal emergency.  I have reviewed and interpreted her chest x-ray film which shows no acute process.  I have reviewed her most recent echo from February 2022 where she had an EF of 30-35%.  Because of this, she was not given large boluses of fluids but will start with a couple 500 cc boluses and put her on a rate.  She is not hypotensive.  She was started on insulin drip and will also be given potassium per protocol.  ECG has been reviewed and interpreted and is unremarkable besides some sinus tachycardia.  I have discussed her case with Dr. Arbutus Leas, who will admit.        Final Clinical Impression(s) / ED Diagnoses Final diagnoses:  Diabetic ketoacidosis without coma associated with type 1 diabetes mellitus (HCC)    Rx / DC Orders ED Discharge Orders     None         Pricilla Loveless, MD 10/30/21 1433

## 2021-10-30 NOTE — ED Triage Notes (Signed)
Pt c/o her glucose meter reading "HIGH" since yesterday. Reports she ran out of her insulin 3 days ago because she hasn't seen her doctor to get a new prescription. Pt is a type 1 diabetic.

## 2021-10-30 NOTE — H&P (Signed)
History and Physical  [name] ZFP:[id number] DOB: 08-19-1986 DOA: 10/30/2021   PCP: [facility]   Patient coming from: Home  Chief Complaint: n/v, abd pain  HPI:  [name] is a 36 y.o. female with medical history of diabetes mellitus type 1, cocaine abuse, systolic CHF, tobacco abuse, MRSA lung empyema presenting with 3-day history of nausea, vomiting, and abdominal pain.  The patient states that she ran out of insulin at least 3 days prior to admission, and she has not been able to follow-up with her primary care doctor at [facility].  She states that she normally takes Lantus insulin 35 units at bedtime and a sliding scale insulin.  She continues to smoke 1 pack/day.  She denies any illicit drug use.  She states that she has been clean for 6 months.  Nevertheless, the patient states that she was incarcerated for about 3 months from June 2022 to September 2022.  Since that period of time, the patient states that she has been out of her cardiac medications.  She denies any fevers, chills, chest pain, headache, neck pain, hematemesis, diarrhea, hematochezia, melena.  She states she had abdominal pain when she had nausea and vomiting. ED Patient was afebrile hemodynamically stable oxygen saturation 100% room air.  BMP shows sodium 132, potassium 4.1, bicarbonate 13, chloride 87, serum glucose 692, creatinine 1.61.  VBG showed 7.2 05/14/1943 on room air LFTs were unremarkable.  WBC 13.7, hemoglobin 14.8, platelets 250,000.  Chest x-ray was negative for infiltrates or edema.  EKG shows sinus tachycardia with nonspecific T wave changes.  The patient was started on IV insulin and IV fluids.  Assessment/Plan: DKA type I -patient started on IV insulin with q 1 hour CBG check and q 4 hour BMPs -pt started on aggressive fluid resuscitation -Electrolytes were monitored and repleted -transitioned to [place] insulin once anion gap closed -diet was advanced once anion  gap closed -HbA1C--  AKI -due to volume depletion -baseline creatinine 0.4-0.6 -presented with serum creatiine 1.61  Tobacco abuse -Tobacco cessation discussed -NicoDerm patch  Hyponatremia -Secondary to hyperglycemia as well as volume depletion  Leukemoid reaction -Likely stress demargination -Blood cultures x2 sets -Check procalcitonin  Chronic systolic CHF -The patient appears volume depleted -12/03/2020 echo EF 30-35% -Restart carvedilol -hold lasix and lisinopril       Past Medical History:  Diagnosis Date   Acid reflux 2010   CHF (congestive heart failure) (HCC)    Diabetes mellitus    DKA 05/01/2011   Hypertension    Polysubstance abuse (HCC)    Past Surgical History:  Procedure Laterality Date   TUBAL LIGATION     WOUND EXPLORATION Left 07/26/2014   Procedure: Irrigation, debridement and exploration of elbow wounds;  Surgeon: Betha Loa, MD;  Location: [facility] OR;  Service: Orthopedics;  Laterality: Left;   Social History:  reports that she has been smoking cigarettes. She has a 15.00 pack-year smoking history. She has never used smokeless tobacco. She reports that she does not currently use drugs after having used the following drugs: Cocaine. She reports that she does not drink alcohol.   Family History  Problem Relation Age of Onset   Coronary artery disease Mother    Lung cancer Mother    Heart attack Father 31   Thyroid disease Maternal Grandmother    Diabetes type I Daughter      Allergies  Allergen Reactions   Codeine Anaphylaxis and Nausea And Vomiting   Penicillins  Rash    As a teenager   Tramadol Itching     Prior to Admission medications   Medication Sig Start Date End Date Taking? Authorizing Provider  insulin aspart (NOVOLOG FLEXPEN) 100 UNIT/ML FlexPen Inject 1-15 Units into the skin 3 (three) times daily with meals. Dose based on CBG and carb count, 1 unit for every 15 carb. 03/12/21  Yes Vassie Loll, MD  carvedilol (COREG)  6.25 MG tablet Take 1 tablet (6.25 mg total) by mouth 2 (two) times daily with a meal. Increased from 3.125 mg. Patient not taking: Reported on 10/30/2021 12/31/20 03/31/21  Darlin Priestly, MD  furosemide (LASIX) 20 MG tablet Take 2 tablets (40 mg total) by mouth daily. Increased from 20 mg daily. Patient not taking: Reported on 10/30/2021 12/31/20 03/31/21  Darlin Priestly, MD  gabapentin (NEURONTIN) 300 MG capsule Take 1 capsule (300 mg total) by mouth 2 (two) times daily. Patient not taking: Reported on 10/30/2021 03/12/21   Vassie Loll, MD  insulin glargine (LANTUS SOLOSTAR) 100 UNIT/ML Solostar Pen Inject 17 Units into the skin 2 (two) times daily. Patient not taking: Reported on 10/30/2021 03/12/21 03/07/22  Vassie Loll, MD  lisinopril (ZESTRIL) 5 MG tablet Take 1 tablet (5 mg total) by mouth daily. New medication for your heart. Patient not taking: Reported on 10/30/2021 01/01/21 04/01/21  Darlin Priestly, MD  pantoprazole (PROTONIX) 40 MG tablet Take 1 tablet (40 mg total) by mouth daily. Patient not taking: Reported on 10/30/2021 03/13/21   Vassie Loll, MD    Review of Systems:  Constitutional:  No weight loss, night sweats, Fevers, chills,  Head&Eyes: No headache.  No vision loss.  No eye pain or scotoma ENT:  No Difficulty swallowing,Tooth/dental problems,Sore throat,  No ear ache, post nasal drip,  Cardio-vascular:  No chest pain, Orthopnea, PND, swelling in lower extremities,  dizziness, palpitations  GI:  No  diarrhea, loss of appetite, hematochezia, melena, heartburn, indigestion, Resp:  No No cough. No coughing up of blood .No wheezing.No chest wall deformity  Skin:  no rash or lesions.  GU:  no dysuria, change in color of urine, no urgency or frequency. No flank pain.  Musculoskeletal:  No joint pain or swelling. No decreased range of motion. No back pain.  Psych:  No change in mood or affect. No depression or anxiety. Neurologic: No headache, no dysesthesia, no focal weakness, no  vision loss. No syncope  Physical Exam: Vitals:   10/30/21 1235 10/30/21 1238 10/30/21 1300 10/30/21 1400  BP:  133/81 (!) 144/82 130/77  Pulse:  (!) 105 (!) 103   Resp:  (!) 23 17 (!) 22  Temp:  97.8 F (36.6 C)    TempSrc:  Oral    SpO2:  100% 100%   Weight: 49.9 kg     Height: 5' (1.524 m)      General:  A&O x 3, NAD, nontoxic, pleasant/cooperative Head/Eye: No conjunctival hemorrhage, no icterus, [place]/AT, No nystagmus ENT:  No icterus,  No thrush, good dentition, no pharyngeal exudate Neck:  No masses, no lymphadenpathy, no bruits CV:  RRR, no rub, no gallop, no S3 Lung: Bibasilar crackles.  No wheezing peer diminished breath sounds. Abdomen: soft/NT, +BS, nondistended, no peritoneal signs Ext: No cyanosis, No rashes, No petechiae, No lymphangitis, No edema Neuro: CNII-XII intact, strength 4/5 in bilateral upper and lower extremities, no dysmetria  Labs on Admission:  Basic Metabolic Panel: Recent Labs  Lab 10/30/21 1247 10/30/21 1310  NA 132* 130*  K 4.1 4.6  CL 87* 94*  CO2 13*  --   GLUCOSE 692* 698*  BUN 22* 24*  CREATININE 1.61* 1.20*  CALCIUM 9.3  --    Liver Function Tests: Recent Labs  Lab 10/30/21 1247  AST 18  ALT 19  ALKPHOS 81  BILITOT 2.1*  PROT 7.6  ALBUMIN 3.8   No results for input(s): LIPASE, AMYLASE in the last 168 hours. No results for input(s): AMMONIA in the last 168 hours. CBC: Recent Labs  Lab 10/30/21 1247 10/30/21 1310  WBC 13.7*  --   NEUTROABS 11.2*  --   HGB 14.8 17.0*  HCT 47.5* 50.0*  MCV 88.0  --   PLT 350  --    Coagulation Profile: No results for input(s): INR, PROTIME in the last 168 hours. Cardiac Enzymes: No results for input(s): CKTOTAL, CKMB, CKMBINDEX, TROPONINI in the last 168 hours. BNP: Invalid input(s): POCBNP CBG: Recent Labs  Lab 10/30/21 1251  GLUCAP >600*   Urine analysis:    Component Value Date/Time   COLORURINE STRAW (A) 03/08/2021 1900   APPEARANCEUR HAZY (A) 03/08/2021 1900    LABSPEC 1.020 03/08/2021 1900   PHURINE 5.0 03/08/2021 1900   GLUCOSEU >=500 (A) 03/08/2021 1900   HGBUR SMALL (A) 03/08/2021 1900   BILIRUBINUR NEGATIVE 03/08/2021 1900   KETONESUR 80 (A) 03/08/2021 1900   PROTEINUR 100 (A) 03/08/2021 1900   UROBILINOGEN 1.0 07/28/2015 1402   NITRITE NEGATIVE 03/08/2021 1900   LEUKOCYTESUR NEGATIVE 03/08/2021 1900   Sepsis Labs: @LABRCNTIP (procalcitonin:4,lacticidven:4) )No results found for this or any previous visit (from the past 240 hour(s)).   Radiological Exams on Admission: DG Chest Portable 1 View  Result Date: 10/30/2021 CLINICAL DATA:  Dyspnea, DKA EXAM: PORTABLE CHEST 1 VIEW COMPARISON:  03/08/2021 FINDINGS: The heart size and mediastinal contours are within normal limits. Bilateral nipple shadows are again seen. Both lungs are clear. The visualized skeletal structures are unremarkable. IMPRESSION: No active disease. Electronically Signed   By: 03/10/2021 D.O.   On: 10/30/2021 13:49    EKG: Independently reviewed. Sinus, nonspecific T wave changes    Time spent:60 minutes Code Status:   FULL Family Communication:  No Family at bedside Disposition Plan: expect 2 day hospitalization Consults called: none DVT Prophylaxis: [place] Lovenox  11/01/2021, DO  [facility] Pager [phone]  If 7PM-7AM, please contact night-coverage [email or web address] Password [email or web address] 10/30/2021, 2:50 PM

## 2021-10-30 NOTE — ED Notes (Signed)
Pt continues to vomit

## 2021-10-31 ENCOUNTER — Inpatient Hospital Stay (HOSPITAL_COMMUNITY): Payer: Medicaid Other

## 2021-10-31 DIAGNOSIS — R7989 Other specified abnormal findings of blood chemistry: Secondary | ICD-10-CM

## 2021-10-31 DIAGNOSIS — Z720 Tobacco use: Secondary | ICD-10-CM | POA: Diagnosis not present

## 2021-10-31 DIAGNOSIS — I5022 Chronic systolic (congestive) heart failure: Secondary | ICD-10-CM

## 2021-10-31 DIAGNOSIS — E1010 Type 1 diabetes mellitus with ketoacidosis without coma: Secondary | ICD-10-CM | POA: Diagnosis not present

## 2021-10-31 LAB — BLOOD CULTURE ID PANEL (REFLEXED) - BCID2

## 2021-10-31 LAB — ECHOCARDIOGRAM COMPLETE
Area-P 1/2: 3.86 cm2
Calc EF: 67 %
Height: 60 in
S' Lateral: 2.7 cm
Single Plane A2C EF: 68 %
Single Plane A4C EF: 67.2 %
Weight: 1629.64 oz

## 2021-10-31 LAB — GLUCOSE, CAPILLARY
Glucose-Capillary: 113 mg/dL — ABNORMAL HIGH (ref 70–99)
Glucose-Capillary: 124 mg/dL — ABNORMAL HIGH (ref 70–99)
Glucose-Capillary: 142 mg/dL — ABNORMAL HIGH (ref 70–99)
Glucose-Capillary: 162 mg/dL — ABNORMAL HIGH (ref 70–99)
Glucose-Capillary: 165 mg/dL — ABNORMAL HIGH (ref 70–99)
Glucose-Capillary: 181 mg/dL — ABNORMAL HIGH (ref 70–99)
Glucose-Capillary: 192 mg/dL — ABNORMAL HIGH (ref 70–99)
Glucose-Capillary: 196 mg/dL — ABNORMAL HIGH (ref 70–99)
Glucose-Capillary: 214 mg/dL — ABNORMAL HIGH (ref 70–99)
Glucose-Capillary: 214 mg/dL — ABNORMAL HIGH (ref 70–99)
Glucose-Capillary: 279 mg/dL — ABNORMAL HIGH (ref 70–99)
Glucose-Capillary: 86 mg/dL (ref 70–99)
Glucose-Capillary: 89 mg/dL (ref 70–99)

## 2021-10-31 LAB — BASIC METABOLIC PANEL
Anion gap: 10 (ref 5–15)
Anion gap: 5 (ref 5–15)
Anion gap: 6 (ref 5–15)
BUN: 20 mg/dL (ref 6–20)
BUN: 20 mg/dL (ref 6–20)
BUN: 21 mg/dL — ABNORMAL HIGH (ref 6–20)
CO2: 26 mmol/L (ref 22–32)
CO2: 28 mmol/L (ref 22–32)
CO2: 29 mmol/L (ref 22–32)
Calcium: 8.3 mg/dL — ABNORMAL LOW (ref 8.9–10.3)
Calcium: 8.4 mg/dL — ABNORMAL LOW (ref 8.9–10.3)
Calcium: 8.6 mg/dL — ABNORMAL LOW (ref 8.9–10.3)
Chloride: 103 mmol/L (ref 98–111)
Chloride: 104 mmol/L (ref 98–111)
Chloride: 104 mmol/L (ref 98–111)
Creatinine, Ser: 0.84 mg/dL (ref 0.44–1.00)
Creatinine, Ser: 0.87 mg/dL (ref 0.44–1.00)
Creatinine, Ser: 0.93 mg/dL (ref 0.44–1.00)
GFR, Estimated: >60 mL/min (ref >60)
GFR, Estimated: >60 mL/min (ref >60)
GFR, Estimated: >60 mL/min (ref >60)
Glucose, Bld: 111 mg/dL — ABNORMAL HIGH (ref 70–99)
Glucose, Bld: 145 mg/dL — ABNORMAL HIGH (ref 70–99)
Glucose, Bld: 198 mg/dL — ABNORMAL HIGH (ref 70–99)
Potassium: 3.6 mmol/L (ref 3.5–5.1)
Potassium: 3.6 mmol/L (ref 3.5–5.1)
Potassium: 4 mmol/L (ref 3.5–5.1)
Sodium: 137 mmol/L (ref 135–145)
Sodium: 138 mmol/L (ref 135–145)
Sodium: 140 mmol/L (ref 135–145)

## 2021-10-31 LAB — BETA-HYDROXYBUTYRIC ACID
Beta-Hydroxybutyric Acid: 0.73 mmol/L — ABNORMAL HIGH (ref 0.05–0.27)
Beta-Hydroxybutyric Acid: 1.81 mmol/L — ABNORMAL HIGH (ref 0.05–0.27)
Beta-Hydroxybutyric Acid: 3.24 mmol/L — ABNORMAL HIGH (ref 0.05–0.27)

## 2021-10-31 MED ORDER — CHLORHEXIDINE GLUCONATE CLOTH 2 % EX PADS
6.0000 | MEDICATED_PAD | Freq: Every day | CUTANEOUS | Status: DC
  Administered 2021-10-31 – 2021-11-01 (×2): 6 via TOPICAL

## 2021-10-31 MED ORDER — INSULIN ASPART 100 UNIT/ML IJ SOLN
0.0000 [IU] | Freq: Three times a day (TID) | INTRAMUSCULAR | Status: DC
  Administered 2021-10-31 – 2021-11-01 (×3): 3 [IU] via SUBCUTANEOUS
  Administered 2021-11-01: 7 [IU] via SUBCUTANEOUS

## 2021-10-31 MED ORDER — INSULIN GLARGINE-YFGN 100 UNIT/ML ~~LOC~~ SOLN
15.0000 [IU] | Freq: Every day | SUBCUTANEOUS | Status: DC
  Administered 2021-10-31: 15 [IU] via SUBCUTANEOUS
  Filled 2021-10-31 (×3): qty 0.15

## 2021-10-31 MED ORDER — INSULIN ASPART 100 UNIT/ML IJ SOLN
0.0000 [IU] | Freq: Every day | INTRAMUSCULAR | Status: DC
  Administered 2021-10-31: 3 [IU] via SUBCUTANEOUS

## 2021-10-31 MED ORDER — SODIUM CHLORIDE 0.9 % IV SOLN
1.0000 g | INTRAVENOUS | Status: DC
  Administered 2021-10-31 – 2021-11-01 (×2): 1 g via INTRAVENOUS
  Filled 2021-10-31 (×2): qty 10

## 2021-10-31 MED ORDER — PROMETHAZINE HCL 25 MG/ML IJ SOLN
INTRAMUSCULAR | Status: AC
  Filled 2021-10-31: qty 1

## 2021-10-31 MED ORDER — LACTATED RINGERS IV SOLN: INTRAVENOUS | Status: AC

## 2021-10-31 MED ORDER — INSULIN ASPART 100 UNIT/ML IJ SOLN
3.0000 [IU] | Freq: Three times a day (TID) | INTRAMUSCULAR | Status: DC
  Administered 2021-10-31 – 2021-11-01 (×4): 3 [IU] via SUBCUTANEOUS

## 2021-10-31 NOTE — Progress Notes (Signed)
Inpatient Diabetes Program Recommendations  AACE/ADA: New Consensus Statement on Inpatient Glycemic Control   Target Ranges:  Prepandial:   less than 140 mg/dL      Peak postprandial:   less than 180 mg/dL (1-2 hours)      Critically ill patients:  140 - 180 mg/dL    Latest Reference Range & Units 10/31/21 04:34 10/31/21 05:29 10/31/21 06:20 10/31/21 07:34 10/31/21 08:20  Glucose-Capillary 70 - 99 mg/dL 827 (H) 078 (H) 675 (H) 192 (H) 165 (H)    Latest Reference Range & Units 10/30/21 12:47  CO2 22 - 32 mmol/L 13 (L)  Glucose 70 - 99 mg/dL 449 (HH)  Anion gap 5 - 15  32 (H)    Review of Glycemic Control  Diabetes history: DM1 (makes NO insulin; requires basal, correction, and meal coverage insulin) Outpatient Diabetes medications: Lantus 35 units QHS, Novolog 0-15 units TID for correction and meal coverage (1 unit for 7 grams carbs) Current orders for Inpatient glycemic control: IV insulin  Inpatient Diabetes Program Recommendations:    Insulin: Once provider is ready to transition from IV to SQ insulin, please consider ordering Semglee 19 units Q24H (based on 46.2 kg x 0.4 units), CBGs Q4H, Novolog 0-6 units Q4H, and Novolog 4 units TID with meals for meal coverage if patient eats at least 50% of meals.  Outpatient: Please provide Rx for Lantus and Novolog at time of discharge (per H&P, patient reported running out of insulin 3 days ago).  NOTE: Noted consult for Diabetes Coordinator. Diabetes Coordinator is not on campus over the weekend but available by pager from 8am to 5pm for questions or concerns. Chart reviewed. Per H&P, patient reported she ran out of insulin 3 days ago and she has not been able to follow-up with her primary care doctor at [facility]. She stated she had been taking Lantus 35 units QHS and sliding scale insulin. Initial lab glucose 692 mg/dl on 11/17/98; patient admitted with DKA and AKI.  Patient has hx of cocaine use and reported she had been clean  for 6 months; however toxicology positive for cocaine on 10/30/21.  Patient has Medicaid insurance and will need Rx for insulin at time of discharge.   Thanks, Orlando Penner, RN, MSN, CDE Diabetes Coordinator Inpatient Diabetes Program [phone] (Team Pager from 8am to 5pm)

## 2021-10-31 NOTE — Progress Notes (Signed)
°  Echocardiogram 2D Echocardiogram has been performed.  [name] 10/31/2021, 3:49 PM

## 2021-10-31 NOTE — Progress Notes (Signed)
Informed MD and patient's nurse that patient's blood cultures came back positive for staph

## 2021-10-31 NOTE — Progress Notes (Signed)
RN called due to blood culture being positive for staphylococcal species.  Patient has history of cocaine abuse and MRSA lung empyema.  Review of medical record revealed that she was treated with IV vancomycin in February and March 2022 during which she was treated with IV vancomycin.   Blood culture done today showed that staphylococcal specie was detected, but procalcitonin was only 0.13, procalcitonin will be repeated in the morning prior to starting IV vancomycin. Patient was afebrile and was hemodynamically stable Echocardiogram done today 10/31/2021 showed LVEF of 60 - 65% which has improved from 30-35% (in February 2022).  Consider TEE Continue to monitor patient and treat accordingly BP 120/78 (BP Location: Left Arm)    Pulse 89    Temp 97.8 F (36.6 C) (Axillary)    Resp 18    Ht 5' (1.524 m)    Wt 46.2 kg    LMP 10/16/2021    SpO2 100%    BMI 19.89 kg/m   Total time:  10 minutes This includes time reviewing the chart including progress notes, labs, EKGs, taking medical decisions, ordering labs and documenting findings.

## 2021-10-31 NOTE — Discharge Summary (Addendum)
Physician Discharge Summary  [name] ZOX:[id number] DOB: [birth year] DOA: 10/30/2021  PCP: [facility]  Admit date: 10/30/2021 Discharge date: 11/01/2021  Admitted From: Home Disposition:  Home   Recommendations for Outpatient Follow-up:  Follow up with PCP in 1-2 weeks Please obtain BMP/CBC in one week     Discharge Condition: Stable CODE STATUS: FULL Diet recommendation: Heart Healthy / Carb Modified    Brief/Interim Summary: 36 y.o. female with medical history of diabetes mellitus type 1, cocaine abuse, systolic CHF, tobacco abuse, MRSA lung empyema presenting with 3-day history of nausea, vomiting, and abdominal pain.  The patient states that she ran out of insulin at least 3 days prior to admission, and she has not been able to follow-up with her primary care doctor at [facility].  She states that she normally takes Lantus insulin 35 units at bedtime and a sliding scale insulin.  She continues to smoke 1 pack/day.  She denies any illicit drug use.  She states that she has been clean for 6 months.  Nevertheless, the patient states that she was incarcerated for about 3 months from June 2022 to September 2022.  Since that period of time, the patient states that she has been out of her cardiac medications.  She denies any fevers, chills, chest pain, headache, neck pain, hematemesis, diarrhea, hematochezia, melena.  She states she had abdominal pain when she had nausea and vomiting. ED Patient was afebrile hemodynamically stable oxygen saturation 100% room air.  BMP shows sodium 132, potassium 4.1, bicarbonate 13, chloride 87, serum glucose 692, creatinine 1.61.  VBG showed 7.2 05/14/1943 on room air LFTs were unremarkable.  WBC 13.7, hemoglobin 14.8, platelets 250,000.  Chest x-ray was negative for infiltrates or edema.  EKG shows sinus tachycardia with nonspecific T wave changes.  The patient was started on IV insulin and IV fluids.    Discharge Diagnoses:    DKA type I -patient started on IV insulin with q 1 hour CBG check and q 4 hour BMPs -pt started on aggressive fluid resuscitation -Electrolytes were monitored and repleted -transitioned to Higbee insulin once anion gap closed 1/15 -diet was advanced once anion gap closed -HbA1C--9.0   AKI -due to volume depletion -baseline creatinine 0.4-0.6 -presented with serum creatiine 1.61 -serum creatinine 0.62 on day of d/c -improving with IVF   Tobacco abuse -Tobacco cessation discussed -NicoDerm patch   Hyponatremia -Secondary to hyperglycemia as well as volume depletion -improved with IVF and DKA correction   Leukemoid reaction -Likely stress demargination -Blood cultures x2 sets--neg to date -Check procalcitonin--0.13 -follow urine culture>>Ecoli -UA >50WBC -started empiric ceftriaxone>>d/c home with cefdinir x 4 days -improved at time of d/c-->8.7   UTI--EColi -UA >50 WBC -urine culture EColi -started on empiric ceftriaxone>>d/c home with cefdinir x 4 days   Chronic systolic CHF -The patient appears volume depleted -12/03/2020 echo EF 30-35% -Restart carvedilol -hold lasix and lisinopril>>restart after d/c  Bacteremia -represents contaminant -staph saprophyticus one of two sets     Discharge Instructions   Allergies as of 11/01/2021       Reactions   Codeine Anaphylaxis, Nausea And Vomiting   Penicillins Rash   As a teenager   Tramadol Itching        Medication List     STOP taking these medications    gabapentin 300 MG capsule Commonly known as: NEURONTIN   pantoprazole 40 MG tablet Commonly known as: PROTONIX       TAKE these medications    carvedilol  6.25 MG tablet Commonly known as: COREG Take 1 tablet (6.25 mg total) by mouth 2 (two) times daily with a meal. Increased from 3.125 mg.   cefdinir 300 MG capsule Commonly known as: OMNICEF Take 1 capsule (300 mg total) by mouth every 12 (twelve) hours.   furosemide 20 MG tablet Commonly  known as: Lasix Take 1 tablet (20 mg total) by mouth daily. What changed:  how much to take additional instructions   Lantus SoloStar 100 UNIT/ML Solostar Pen Generic drug: insulin glargine Inject 17 Units into the skin 2 (two) times daily.   lisinopril 5 MG tablet Commonly known as: ZESTRIL Take 1 tablet (5 mg total) by mouth daily. New medication for your heart.   NovoLOG FlexPen 100 UNIT/ML FlexPen Generic drug: insulin aspart Inject 1-15 Units into the skin 3 (three) times daily with meals. Dose based on CBG and carb count, 1 unit for every 15 carb.        Allergies  Allergen Reactions   Codeine Anaphylaxis and Nausea And Vomiting   Penicillins Rash    As a teenager   Tramadol Itching    Consultations: none   Procedures/Studies: DG Chest Portable 1 View  Result Date: 10/30/2021 CLINICAL DATA:  Dyspnea, DKA EXAM: PORTABLE CHEST 1 VIEW COMPARISON:  03/08/2021 FINDINGS: The heart size and mediastinal contours are within normal limits. Bilateral nipple shadows are again seen. Both lungs are clear. The visualized skeletal structures are unremarkable. IMPRESSION: No active disease. Electronically Signed   By: Duanne Guess D.O.   On: 10/30/2021 13:49   ECHOCARDIOGRAM COMPLETE  Result Date: 10/31/2021    ECHOCARDIOGRAM REPORT   Patient Name:   [name] Date of Exam: 10/31/2021 Medical Rec #:  [id number]         Height:       60.0 in Accession #:    [id number]        Weight:       101.9 lb Date of Birth:  16-Oct-1986         BSA:          1.401 m Patient Age:    35 years          BP:           169/99 mmHg Patient Gender: F                 HR:           88 bpm. [facility] Procedure: 2D Echo, Cardiac Doppler and Color Doppler Indications:    I50.40* Unspecified combined systolic (congestive) and diastolic                 (congestive) heart failure  History:        Patient has prior history of Echocardiogram examinations, most                 recent  12/03/2020. Signs/Symptoms:Murmur; Risk Factors:Current                 Smoker and Diabetes. Cocaine use.  Sonographer:    Sheralyn Boatman RDCS Referring Phys: [phone] Lucah Petta  Sonographer Comments: Technically difficult study due to poor echo windows. IMPRESSIONS  1. Left ventricular ejection fraction, by estimation, is 60 to 65%. The left ventricle has normal function. The left ventricle has no regional wall motion abnormalities. Left ventricular diastolic parameters were normal.  2. Right ventricular systolic function is normal. The right ventricular size is normal. Tricuspid regurgitation signal  is inadequate for assessing PA pressure.  3. The mitral valve is normal in structure. Mild mitral valve regurgitation.  4. The aortic valve is tricuspid. Aortic valve regurgitation is not visualized.  5. The inferior vena cava is normal in size with greater than 50% respiratory variability, suggesting right atrial pressure of 3 mmHg. Comparison(s): Compared to prior TTE in 11/2020, her LVEF has significantly improved from 30-35% to 60-65% and RV systolic function has improved. FINDINGS  Left Ventricle: Left ventricular ejection fraction, by estimation, is 60 to 65%. The left ventricle has normal function. The left ventricle has no regional wall motion abnormalities. The left ventricular internal cavity size was normal in size. There is  no left ventricular hypertrophy. Left ventricular diastolic parameters were normal. Right Ventricle: The right ventricular size is normal. No increase in right ventricular wall thickness. Right ventricular systolic function is normal. Tricuspid regurgitation signal is inadequate for assessing PA pressure. Left Atrium: Left atrial size was normal in size. Right Atrium: Right atrial size was normal in size. Pericardium: There is no evidence of pericardial effusion. Mitral Valve: The mitral valve is normal in structure. Mild mitral valve regurgitation. Tricuspid Valve: The tricuspid valve is normal  in structure. Tricuspid valve regurgitation is trivial. Aortic Valve: The aortic valve is tricuspid. Aortic valve regurgitation is not visualized. Pulmonic Valve: The pulmonic valve was normal in structure. Pulmonic valve regurgitation is trivial. Aorta: The aortic root and ascending aorta are structurally normal, with no evidence of dilitation. Venous: The inferior vena cava is normal in size with greater than 50% respiratory variability, suggesting right atrial pressure of 3 mmHg. IAS/Shunts: The atrial septum is grossly normal.  LEFT VENTRICLE PLAX 2D LVIDd:         3.90 cm     Diastology LVIDs:         2.70 cm     LV e' medial:    5.77 cm/s LV PW:         0.70 cm     LV E/e' medial:  18.9 LV IVS:        0.70 cm     LV e' lateral:   7.29 cm/s LVOT diam:     1.70 cm     LV E/e' lateral: 14.9 LV SV:         52 LV SV Index:   37 LVOT Area:     2.27 cm  LV Volumes (MOD) LV vol d, MOD A2C: 67.8 ml LV vol d, MOD A4C: 52.8 ml LV vol s, MOD A2C: 21.7 ml LV vol s, MOD A4C: 17.3 ml LV SV MOD A2C:     46.1 ml LV SV MOD A4C:     52.8 ml LV SV MOD BP:      44.0 ml RIGHT VENTRICLE             IVC RV Basal diam:  3.70 cm     IVC diam: 2.00 cm RV S prime:     12.90 cm/s TAPSE (M-mode): 2.0 cm LEFT ATRIUM             Index        RIGHT ATRIUM          Index LA diam:        2.70 cm 1.93 cm/m   RA Area:     8.95 cm LA Vol (A2C):   15.6 ml 11.13 ml/m  RA Volume:   19.30 ml 13.77 ml/m LA Vol (A4C):   12.4 ml 8.85  ml/m LA Biplane Vol: 14.9 ml 10.63 ml/m  AORTIC VALVE LVOT Vmax:   135.00 cm/s LVOT Vmean:  85.100 cm/s LVOT VTI:    0.229 m  AORTA Ao Root diam: 2.60 cm Ao Asc diam:  2.70 cm MITRAL VALVE MV Area (PHT): 3.86 cm     SHUNTS MV Decel Time: 196 msec     Systemic VTI:  0.23 m MV E velocity: 108.93 cm/s  Systemic Diam: 1.70 cm MV A velocity: 99.60 cm/s MV E/A ratio:  1.09 Laurance Flatten MD Electronically signed by Laurance Flatten MD Signature Date/Time: 10/31/2021/4:47:09 PM    Final         Discharge  Exam: Vitals:   11/01/21 0511 11/01/21 1335  BP: (!) 150/81 128/78  Pulse: 75 77  Resp: 18 18  Temp: 98.7 F (37.1 C) 98.7 F (37.1 C)  SpO2: 95% 98%   Vitals:   10/31/21 2201 11/01/21 0213 11/01/21 0511 11/01/21 1335  BP: 135/80 130/81 (!) 150/81 128/78  Pulse: 88 75 75 77  Resp: 18 18 18 18   Temp: 98 F (36.7 C) 98.2 F (36.8 C) 98.7 F (37.1 C) 98.7 F (37.1 C)  TempSrc: Oral Oral Oral Oral  SpO2: 98% 95% 95% 98%  Weight:      Height:        General: Pt is alert, awake, not in acute distress Cardiovascular: RRR, S1/S2 +, no rubs, no gallops Respiratory: CTA bilaterally, no wheezing, no rhonchi Abdominal: Soft, NT, ND, bowel sounds + Extremities: no edema, no cyanosis   The results of significant diagnostics from this hospitalization (including imaging, microbiology, ancillary and laboratory) are listed below for reference.    Significant Diagnostic Studies: DG Chest Portable 1 View  Result Date: 10/30/2021 CLINICAL DATA:  Dyspnea, DKA EXAM: PORTABLE CHEST 1 VIEW COMPARISON:  03/08/2021 FINDINGS: The heart size and mediastinal contours are within normal limits. Bilateral nipple shadows are again seen. Both lungs are clear. The visualized skeletal structures are unremarkable. IMPRESSION: No active disease. Electronically Signed   By: Duanne Guess D.O.   On: 10/30/2021 13:49   ECHOCARDIOGRAM COMPLETE  Result Date: 10/31/2021    ECHOCARDIOGRAM REPORT   Patient Name:   [name] Date of Exam: 10/31/2021 Medical Rec #:  [id number]         Height:       60.0 in Accession #:    [id number]        Weight:       101.9 lb Date of Birth:  1986/08/02         BSA:          1.401 m Patient Age:    35 years          BP:           169/99 mmHg Patient Gender: F                 HR:           88 bpm. [facility] Procedure: 2D Echo, Cardiac Doppler and Color Doppler Indications:    I50.40* Unspecified combined systolic (congestive) and diastolic                  (congestive) heart failure  History:        Patient has prior history of Echocardiogram examinations, most                 recent 12/03/2020. Signs/Symptoms:Murmur; Risk Factors:Current  Smoker and Diabetes. Cocaine use.  Sonographer:    Sheralyn Boatman RDCS Referring Phys: [phone] Florenda Watt  Sonographer Comments: Technically difficult study due to poor echo windows. IMPRESSIONS  1. Left ventricular ejection fraction, by estimation, is 60 to 65%. The left ventricle has normal function. The left ventricle has no regional wall motion abnormalities. Left ventricular diastolic parameters were normal.  2. Right ventricular systolic function is normal. The right ventricular size is normal. Tricuspid regurgitation signal is inadequate for assessing PA pressure.  3. The mitral valve is normal in structure. Mild mitral valve regurgitation.  4. The aortic valve is tricuspid. Aortic valve regurgitation is not visualized.  5. The inferior vena cava is normal in size with greater than 50% respiratory variability, suggesting right atrial pressure of 3 mmHg. Comparison(s): Compared to prior TTE in 11/2020, her LVEF has significantly improved from 30-35% to 60-65% and RV systolic function has improved. FINDINGS  Left Ventricle: Left ventricular ejection fraction, by estimation, is 60 to 65%. The left ventricle has normal function. The left ventricle has no regional wall motion abnormalities. The left ventricular internal cavity size was normal in size. There is  no left ventricular hypertrophy. Left ventricular diastolic parameters were normal. Right Ventricle: The right ventricular size is normal. No increase in right ventricular wall thickness. Right ventricular systolic function is normal. Tricuspid regurgitation signal is inadequate for assessing PA pressure. Left Atrium: Left atrial size was normal in size. Right Atrium: Right atrial size was normal in size. Pericardium: There is no evidence of pericardial effusion.  Mitral Valve: The mitral valve is normal in structure. Mild mitral valve regurgitation. Tricuspid Valve: The tricuspid valve is normal in structure. Tricuspid valve regurgitation is trivial. Aortic Valve: The aortic valve is tricuspid. Aortic valve regurgitation is not visualized. Pulmonic Valve: The pulmonic valve was normal in structure. Pulmonic valve regurgitation is trivial. Aorta: The aortic root and ascending aorta are structurally normal, with no evidence of dilitation. Venous: The inferior vena cava is normal in size with greater than 50% respiratory variability, suggesting right atrial pressure of 3 mmHg. IAS/Shunts: The atrial septum is grossly normal.  LEFT VENTRICLE PLAX 2D LVIDd:         3.90 cm     Diastology LVIDs:         2.70 cm     LV e' medial:    5.77 cm/s LV PW:         0.70 cm     LV E/e' medial:  18.9 LV IVS:        0.70 cm     LV e' lateral:   7.29 cm/s LVOT diam:     1.70 cm     LV E/e' lateral: 14.9 LV SV:         52 LV SV Index:   37 LVOT Area:     2.27 cm  LV Volumes (MOD) LV vol d, MOD A2C: 67.8 ml LV vol d, MOD A4C: 52.8 ml LV vol s, MOD A2C: 21.7 ml LV vol s, MOD A4C: 17.3 ml LV SV MOD A2C:     46.1 ml LV SV MOD A4C:     52.8 ml LV SV MOD BP:      44.0 ml RIGHT VENTRICLE             IVC RV Basal diam:  3.70 cm     IVC diam: 2.00 cm RV S prime:     12.90 cm/s TAPSE (M-mode): 2.0 cm LEFT ATRIUM  Index        RIGHT ATRIUM          Index LA diam:        2.70 cm 1.93 cm/m   RA Area:     8.95 cm LA Vol (A2C):   15.6 ml 11.13 ml/m  RA Volume:   19.30 ml 13.77 ml/m LA Vol (A4C):   12.4 ml 8.85 ml/m LA Biplane Vol: 14.9 ml 10.63 ml/m  AORTIC VALVE LVOT Vmax:   135.00 cm/s LVOT Vmean:  85.100 cm/s LVOT VTI:    0.229 m  AORTA Ao Root diam: 2.60 cm Ao Asc diam:  2.70 cm MITRAL VALVE MV Area (PHT): 3.86 cm     SHUNTS MV Decel Time: 196 msec     Systemic VTI:  0.23 m MV E velocity: 108.93 cm/s  Systemic Diam: 1.70 cm MV A velocity: 99.60 cm/s MV E/A ratio:  1.09 Laurance Flatten  MD Electronically signed by Laurance Flatten MD Signature Date/Time: 10/31/2021/4:47:09 PM    Final     Microbiology: Recent Results (from the past 240 hour(s))  Urine Culture     Status: Abnormal (Preliminary result)   Collection Time: 10/30/21 12:41 PM   Specimen: Urine, Clean Catch  Result Value Ref Range Status   Specimen Description   Final    URINE, CLEAN CATCH Performed at [facility], [address], [place], [place] [address]    Special Requests   Final    NONE Performed at [facility], [address], [place], [place] [address]    Culture (A)  Final    >=100,000 COLONIES/mL ESCHERICHIA COLI SUSCEPTIBILITIES TO FOLLOW Performed at [facility], [address], [place], [place] [address]    Report Status PENDING  Incomplete  Resp Panel by RT-PCR (Flu A&B, Covid) Nasopharyngeal Swab     Status: None   Collection Time: 10/30/21  1:25 PM   Specimen: Nasopharyngeal Swab; Nasopharyngeal(NP) swabs in vial transport medium  Result Value Ref Range Status   SARS Coronavirus 2 by RT PCR NEGATIVE NEGATIVE Final    Comment: (NOTE) SARS-CoV-2 target nucleic acids are NOT DETECTED.  The SARS-CoV-2 RNA is generally detectable in upper respiratory specimens during the acute phase of infection. The lowest concentration of SARS-CoV-2 viral copies this assay can detect is 138 copies/mL. A negative result does not preclude SARS-Cov-2 infection and should not be used as the sole basis for treatment or other patient management decisions. A negative result may occur with  improper specimen collection/handling, submission of specimen other than nasopharyngeal swab, presence of viral mutation(s) within the areas targeted by this assay, and inadequate number of viral copies(<138 copies/mL). A negative result must be combined with clinical observations, patient history, and epidemiological information. The expected result is Negative.  Fact Sheet for Patients:   BloggerCourse.com  Fact Sheet for Healthcare Providers:  SeriousBroker.it  This test is no t yet approved or cleared by the [place] FDA and  has been authorized for detection and/or diagnosis of SARS-CoV-2 by FDA under an Emergency Use Authorization (EUA). This EUA will remain  in effect (meaning this test can be used) for the duration of the COVID-19 declaration under Section 564(b)(1) of the Act, 21 U.S.C.section 360bbb-3(b)(1), unless the authorization is terminated  or revoked sooner.       Influenza A by PCR NEGATIVE NEGATIVE Final   Influenza B by PCR NEGATIVE NEGATIVE Final    Comment: (NOTE) The Xpert Xpress SARS-CoV-2/FLU/RSV plus assay is intended as an  aid in the diagnosis of influenza from Nasopharyngeal swab specimens and should not be used as a sole basis for treatment. Nasal washings and aspirates are unacceptable for Xpert Xpress SARS-CoV-2/FLU/RSV testing.  Fact Sheet for Patients: BloggerCourse.com  Fact Sheet for Healthcare Providers: SeriousBroker.it  This test is not yet approved or cleared by the [place] FDA and has been authorized for detection and/or diagnosis of SARS-CoV-2 by FDA under an Emergency Use Authorization (EUA). This EUA will remain in effect (meaning this test can be used) for the duration of the COVID-19 declaration under Section 564(b)(1) of the Act, 21 U.S.C. section 360bbb-3(b)(1), unless the authorization is terminated or revoked.  Performed at [facility], [address] [place], [place] [address]   Culture, blood (Routine X 2) w Reflex to ID Panel     Status: Abnormal (Preliminary result)   Collection Time: 10/30/21  3:13 PM   Specimen: Left Antecubital; Blood  Result Value Ref Range Status   Specimen Description   Final    LEFT ANTECUBITAL BOTTLES DRAWN AEROBIC AND ANAEROBIC Performed at [facility], [address] [place], [place] [address]    Special Requests   Final    Blood Culture results may not be optimal due to an excessive volume of blood received in culture bottles Performed at [facility], [address] [place], [place] [address]    Culture  Setup Time   Final    GRAM POSITIVE COCCI Gram Stain Report Called to,Read Back By and Verified With:  W. EARLY @ 1505 10/31/21 BY STEPHTR CRITICAL RESULT CALLED TO, READ BACK BY AND VERIFIED WITH: RN LATINA EVANS 10/31/21@21 :28 BY TW AEROBIC BOTTLE ONLY Performed at [facility], [address], [place], [place] [address]    Culture STAPHYLOCOCCUS SAPROPHYTICUS (A)  Final   Report Status PENDING  Incomplete  Blood Culture ID Panel (Reflexed)     Status: Abnormal   Collection Time: 10/30/21  3:13 PM  Result Value Ref Range Status   Enterococcus faecalis NOT DETECTED NOT DETECTED Final   Enterococcus Faecium NOT DETECTED NOT DETECTED Final   Listeria monocytogenes NOT DETECTED NOT DETECTED Final   Staphylococcus species DETECTED (A) NOT DETECTED Final    Comment: CRITICAL RESULT CALLED TO, READ BACK BY AND VERIFIED WITH: RN LATINA EVANS 10/31/21@21 :28 BY TW    Staphylococcus aureus (BCID) NOT DETECTED NOT DETECTED Final   Staphylococcus epidermidis NOT DETECTED NOT DETECTED Final   Staphylococcus lugdunensis NOT DETECTED NOT DETECTED Final   Streptococcus species NOT DETECTED NOT DETECTED Final   Streptococcus agalactiae NOT DETECTED NOT DETECTED Final   Streptococcus pneumoniae NOT DETECTED NOT DETECTED Final   Streptococcus pyogenes NOT DETECTED NOT DETECTED Final   A.calcoaceticus-baumannii NOT DETECTED NOT DETECTED Final   Bacteroides fragilis NOT DETECTED NOT DETECTED Final   Enterobacterales NOT DETECTED NOT DETECTED Final   Enterobacter cloacae complex NOT DETECTED NOT DETECTED Final   Escherichia coli NOT DETECTED NOT DETECTED Final   Klebsiella aerogenes NOT DETECTED NOT DETECTED Final   Klebsiella oxytoca NOT DETECTED NOT  DETECTED Final   Klebsiella pneumoniae NOT DETECTED NOT DETECTED Final   Proteus species NOT DETECTED NOT DETECTED Final   Salmonella species NOT DETECTED NOT DETECTED Final   Serratia marcescens NOT DETECTED NOT DETECTED Final   Haemophilus influenzae NOT DETECTED NOT DETECTED Final   Neisseria meningitidis NOT DETECTED NOT DETECTED Final   Pseudomonas aeruginosa NOT DETECTED NOT DETECTED Final   Stenotrophomonas maltophilia NOT DETECTED NOT DETECTED Final  Candida albicans NOT DETECTED NOT DETECTED Final   Candida auris NOT DETECTED NOT DETECTED Final   Candida glabrata NOT DETECTED NOT DETECTED Final   Candida krusei NOT DETECTED NOT DETECTED Final   Candida parapsilosis NOT DETECTED NOT DETECTED Final   Candida tropicalis NOT DETECTED NOT DETECTED Final   Cryptococcus neoformans/gattii NOT DETECTED NOT DETECTED Final    Comment: Performed at [facility], [address], [place], [place] [address]  MRSA Next Gen by PCR, Nasal     Status: Abnormal   Collection Time: 10/30/21  4:06 PM   Specimen: Nasal Mucosa; Nasal Swab  Result Value Ref Range Status   MRSA by PCR Next Gen DETECTED (A) NOT DETECTED Final    Comment: RESULT CALLED TO, READ BACK BY AND VERIFIED WITH: SHILTON,A AT 1748 ON 1.14.23 BY RUCINSKI,B (NOTE) The GeneXpert MRSA Assay (FDA approved for NASAL specimens only), is one component of a comprehensive MRSA colonization surveillance program. It is not intended to diagnose MRSA infection nor to guide or monitor treatment for MRSA infections. Test performance is not FDA approved in patients less than 10 years old. Performed at [facility], [address] [place], [place] [address]   Culture, blood (Routine X 2) w Reflex to ID Panel     Status: None (Preliminary result)   Collection Time: 10/30/21  4:45 PM   Specimen: Left Antecubital; Blood  Result Value Ref Range Status   Specimen Description   Final    LEFT ANTECUBITAL BOTTLES DRAWN AEROBIC AND ANAEROBIC    Special Requests   Final    Blood Culture results may not be optimal due to an excessive volume of blood received in culture bottles   Culture   Final    NO GROWTH < 24 HOURS Performed at [facility], [address] [place], [place] [address]    Report Status PENDING  Incomplete     Labs: Basic Metabolic Panel: Recent Labs  Lab 10/30/21 2006 10/31/21 0010 10/31/21 0402 10/31/21 0742 11/01/21 0426  NA 136 138 140 137 133*  K 3.5 3.6 4.0 3.6 3.6  CL 101 103 104 104 99  CO2 GLUCOSE 298* 145* 111* 198* 296*  BUN 21* 12  CREATININE 1.10* 0.93 0.84 0.87 0.62  CALCIUM 8.5* 8.4* 8.6* 8.3* 8.0*  MG  --   --   --   --  1.6*   Liver Function Tests: Recent Labs  Lab 10/30/21 1247  AST 18  ALT 19  ALKPHOS 81  BILITOT 2.1*  PROT 7.6  ALBUMIN 3.8   No results for input(s): LIPASE, AMYLASE in the last 168 hours. No results for input(s): AMMONIA in the last 168 hours. CBC: Recent Labs  Lab 10/30/21 1247 10/30/21 1310 11/01/21 0426  WBC 13.7*  --  8.7  NEUTROABS 11.2*  --   --   HGB 14.8 17.0* 11.3*  HCT 47.5* 50.0* 35.2*  MCV 88.0  --  84.0  PLT 350  --  204   Cardiac Enzymes: No results for input(s): CKTOTAL, CKMB, CKMBINDEX, TROPONINI in the last 168 hours. BNP: Invalid input(s): POCBNP CBG: Recent Labs  Lab 10/31/21 1139 10/31/21 1611 10/31/21 2130 11/01/21 0806 11/01/21 1130  GLUCAP 214* 214* 279* 329* 238*    Time coordinating discharge:  36 minutes  Signed:  Catarina Hartshorn, DO [facility] Pager: [phone] 11/01/2021, 1:55 PM

## 2021-10-31 NOTE — Plan of Care (Signed)

## 2021-10-31 NOTE — Progress Notes (Signed)
PROGRESS NOTE  [name] [id number] DOB: 09/29/1986 DOA: 10/30/2021 PCP: [facility]  Brief History:  36 y.o. female with medical history of diabetes mellitus type 1, cocaine abuse, systolic CHF, tobacco abuse, MRSA lung empyema presenting with 3-day history of nausea, vomiting, and abdominal pain.  The patient states that she ran out of insulin at least 3 days prior to admission, and she has not been able to follow-up with her primary care doctor at [facility].  She states that she normally takes Lantus insulin 35 units at bedtime and a sliding scale insulin.  She continues to smoke 1 pack/day.  She denies any illicit drug use.  She states that she has been clean for 6 months.  Nevertheless, the patient states that she was incarcerated for about 3 months from June 2022 to September 2022.  Since that period of time, the patient states that she has been out of her cardiac medications.  She denies any fevers, chills, chest pain, headache, neck pain, hematemesis, diarrhea, hematochezia, melena.  She states she had abdominal pain when she had nausea and vomiting. ED Patient was afebrile hemodynamically stable oxygen saturation 100% room air.  BMP shows sodium 132, potassium 4.1, bicarbonate 13, chloride 87, serum glucose 692, creatinine 1.61.  VBG showed 7.2 05/[birth year]1941 on room air LFTs were unremarkable.  WBC 13.7, hemoglobin 14.8, platelets 250,000.  Chest x-ray was negative for infiltrates or edema.  EKG shows sinus tachycardia with nonspecific T wave changes.  The patient was started on IV insulin and IV fluids.  Assessment/Plan: DKA type I -patient started on IV insulin with q 1 hour CBG check and q 4 hour BMPs -pt started on aggressive fluid resuscitation -Electrolytes were monitored and repleted -transitioned to [place] insulin once anion gap closed 1/15 -diet was advanced once anion gap closed -HbA1C--9.0   AKI -due to volume depletion -baseline  creatinine 0.4-0.6 -presented with serum creatiine 1.61 -improving with IVF   Tobacco abuse -Tobacco cessation discussed -NicoDerm patch   Hyponatremia -Secondary to hyperglycemia as well as volume depletion -improved with IVF and DKA correction   Leukemoid reaction -Likely stress demargination -Blood cultures x2 sets -Check procalcitonin--0.13 -follow urine culture -UA [id number]   Chronic systolic CHF -The patient appears volume depleted -12/03/2020 echo EF 30-35% -Restart carvedilol -hold lasix and lisinopril           Family Communication: no  Family at bedside  Consultants:  none  Code Status:  FULL   DVT Prophylaxis:  [place] Lovenox   Procedures: As Listed in Progress Note Above  Antibiotics: None     Subjective: Patient denies fevers, chills, headache, chest pain, dyspnea, nausea, vomiting, diarrhea, abdominal pain, dysuria, hematuria, hematochezia, and melena.   Objective: Vitals:   10/31/21 0300 10/31/21 0400 10/31/21 0500 10/31/21 0600  BP: 130/60 139/69  137/78  Pulse: 87 86  88  Resp: 15 13  15   Temp:  99 F (37.2 C)    TempSrc:  Oral    SpO2: 98% 97%  97%  Weight:   46.2 kg   Height:        Intake/Output Summary (Last 24 hours) at 10/31/2021 0902 Last data filed at 10/31/2021 0600 Gross per 24 hour  Intake 3089.05 ml  Output --  Net 3089.05 ml   Weight change:  Exam:  General:  Pt is alert, follows commands appropriately, not in acute distress HEENT: No icterus, No thrush, No neck mass, [place]/AT  Cardiovascular: RRR, S1/S2, no rubs, no gallops Respiratory: CTA bilaterally, no wheezing, no crackles, no rhonchi Abdomen: Soft/+BS, non tender, non distended, no guarding Extremities: No edema, No lymphangitis, No petechiae, No rashes, no synovitis   Data Reviewed: I have personally reviewed following labs and imaging studies Basic Metabolic Panel: Recent Labs  Lab 10/30/21 1512 10/30/21 2006 10/31/21 0010 10/31/21 0402  10/31/21 0742  NA 133* 136 138 140 137  K 4.7 3.5 3.6 4.0 3.6  CL 90* 101 103 104 104  CO2 11* 26 29 26 28   GLUCOSE 663* 298* 145* 111* 198*  BUN 23* 20 20 20  21*  CREATININE 1.44* 1.10* 0.93 0.84 0.87  CALCIUM 9.1 8.5* 8.4* 8.6* 8.3*   Liver Function Tests: Recent Labs  Lab 10/30/21 1247  AST 18  ALT 19  ALKPHOS 81  BILITOT 2.1*  PROT 7.6  ALBUMIN 3.8   No results for input(s): LIPASE, AMYLASE in the last 168 hours. No results for input(s): AMMONIA in the last 168 hours. Coagulation Profile: No results for input(s): INR, PROTIME in the last 168 hours. CBC: Recent Labs  Lab 10/30/21 1247 10/30/21 1310  WBC 13.7*  --   NEUTROABS 11.2*  --   HGB 14.8 17.0*  HCT 47.5* 50.0*  MCV 88.0  --   PLT 350  --    Cardiac Enzymes: No results for input(s): CKTOTAL, CKMB, CKMBINDEX, TROPONINI in the last 168 hours. BNP: Invalid input(s): POCBNP CBG: Recent Labs  Lab 10/31/21 0434 10/31/21 0529 10/31/21 0620 10/31/21 0734 10/31/21 0820  GLUCAP 142* 162* 196* 192* 165*   HbA1C: Recent Labs    10/30/21 1645  HGBA1C 9.0*   Urine analysis:    Component Value Date/Time   COLORURINE YELLOW 10/30/2021 1241   APPEARANCEUR HAZY (A) 10/30/2021 1241   LABSPEC 1.020 10/30/2021 1241   PHURINE 5.5 10/30/2021 1241   GLUCOSEU >=500 (A) 10/30/2021 1241   HGBUR TRACE (A) 10/30/2021 1241   BILIRUBINUR NEGATIVE 10/30/2021 1241   KETONESUR >80 (A) 10/30/2021 1241   PROTEINUR NEGATIVE 10/30/2021 1241   UROBILINOGEN 1.0 07/28/2015 1402   NITRITE NEGATIVE 10/30/2021 1241   LEUKOCYTESUR SMALL (A) 10/30/2021 1241   Sepsis Labs: @LABRCNTIP (procalcitonin:4,lacticidven:4) ) Recent Results (from the past 240 hour(s))  Resp Panel by RT-PCR (Flu A&B, Covid) Nasopharyngeal Swab     Status: None   Collection Time: 10/30/21  1:25 PM   Specimen: Nasopharyngeal Swab; Nasopharyngeal(NP) swabs in vial transport medium  Result Value Ref Range Status   SARS Coronavirus 2 by RT PCR NEGATIVE  NEGATIVE Final    Comment: (NOTE) SARS-CoV-2 target nucleic acids are NOT DETECTED.  The SARS-CoV-2 RNA is generally detectable in upper respiratory specimens during the acute phase of infection. The lowest concentration of SARS-CoV-2 viral copies this assay can detect is 138 copies/mL. A negative result does not preclude SARS-Cov-2 infection and should not be used as the sole basis for treatment or other patient management decisions. A negative result may occur with  improper specimen collection/handling, submission of specimen other than nasopharyngeal swab, presence of viral mutation(s) within the areas targeted by this assay, and inadequate number of viral copies(<138 copies/mL). A negative result must be combined with clinical observations, patient history, and epidemiological information. The expected result is Negative.  Fact Sheet for Patients:  EntrepreneurPulse.com.au  Fact Sheet for Healthcare Providers:  IncredibleEmployment.be  This test is no t yet approved or cleared by the [place] FDA and  has been authorized for detection and/or diagnosis of SARS-CoV-2 by  FDA under an Emergency Use Authorization (EUA). This EUA will remain  in effect (meaning this test can be used) for the duration of the COVID-19 declaration under Section 564(b)(1) of the Act, 21 U.S.C.section 360bbb-3(b)(1), unless the authorization is terminated  or revoked sooner.       Influenza A by PCR NEGATIVE NEGATIVE Final   Influenza B by PCR NEGATIVE NEGATIVE Final    Comment: (NOTE) The Xpert Xpress SARS-CoV-2/FLU/RSV plus assay is intended as an aid in the diagnosis of influenza from Nasopharyngeal swab specimens and should not be used as a sole basis for treatment. Nasal washings and aspirates are unacceptable for Xpert Xpress SARS-CoV-2/FLU/RSV testing.  Fact Sheet for Patients: EntrepreneurPulse.com.au  Fact Sheet for Healthcare  Providers: IncredibleEmployment.be  This test is not yet approved or cleared by the [place] FDA and has been authorized for detection and/or diagnosis of SARS-CoV-2 by FDA under an Emergency Use Authorization (EUA). This EUA will remain in effect (meaning this test can be used) for the duration of the COVID-19 declaration under Section 564(b)(1) of the Act, 21 U.S.C. section 360bbb-3(b)(1), unless the authorization is terminated or revoked.  Performed at [facility], [address], [place], [place] [address]   Culture, blood (Routine X 2) w Reflex to ID Panel     Status: None (Preliminary result)   Collection Time: 10/30/21  3:13 PM   Specimen: Left Antecubital; Blood  Result Value Ref Range Status   Specimen Description   Final    LEFT ANTECUBITAL BOTTLES DRAWN AEROBIC AND ANAEROBIC   Special Requests   Final    Blood Culture results may not be optimal due to an excessive volume of blood received in culture bottles   Culture   Final    NO GROWTH < 24 HOURS Performed at [facility], [address] [place], [place] [address]    Report Status PENDING  Incomplete  MRSA Next Gen by PCR, Nasal     Status: Abnormal   Collection Time: 10/30/21  4:06 PM   Specimen: Nasal Mucosa; Nasal Swab  Result Value Ref Range Status   MRSA by PCR Next Gen DETECTED (A) NOT DETECTED Final    Comment: RESULT CALLED TO, READ BACK BY AND VERIFIED WITH: SHILTON,A AT [id number] ON 1.14.23 BY RUCINSKI,B (NOTE) The GeneXpert MRSA Assay (FDA approved for NASAL specimens only), is one component of a comprehensive MRSA colonization surveillance program. It is not intended to diagnose MRSA infection nor to guide or monitor treatment for MRSA infections. Test performance is not FDA approved in patients less than 25 years old. Performed at [facility], [address] [place], [place] [address]   Culture, blood (Routine X 2) w Reflex to ID Panel     Status: None (Preliminary result)    Collection Time: 10/30/21  4:45 PM   Specimen: Left Antecubital; Blood  Result Value Ref Range Status   Specimen Description   Final    LEFT ANTECUBITAL BOTTLES DRAWN AEROBIC AND ANAEROBIC   Special Requests   Final    Blood Culture results may not be optimal due to an excessive volume of blood received in culture bottles   Culture   Final    NO GROWTH < 24 HOURS Performed at [facility], [address] [place], [place] [address]    Report Status PENDING  Incomplete     Scheduled Meds:  carvedilol  6.25 mg Oral BID WC   enoxaparin (LOVENOX) injection  40 mg Subcutaneous Q24H   insulin  aspart  0-5 Units Subcutaneous QHS   insulin aspart  0-9 Units Subcutaneous TID WC   insulin aspart  3 Units Subcutaneous TID WC   insulin glargine-yfgn  15 Units Subcutaneous Daily   mupirocin ointment   Nasal BID   ondansetron (ZOFRAN) IV  4 mg Intravenous Q6H   pantoprazole (PROTONIX) IV  40 mg Intravenous Q12H   Continuous Infusions:  lactated ringers     promethazine (PHENERGAN) injection (IM or IVPB)      Procedures/Studies: DG Chest Portable 1 View  Result Date: 10/30/2021 CLINICAL DATA:  Dyspnea, DKA EXAM: PORTABLE CHEST 1 VIEW COMPARISON:  03/08/2021 FINDINGS: The heart size and mediastinal contours are within normal limits. Bilateral nipple shadows are again seen. Both lungs are clear. The visualized skeletal structures are unremarkable. IMPRESSION: No active disease. Electronically Signed   By: Davina Poke D.O.   On: 10/30/2021 13:49    Orson Eva, DO  [facility]  If 7PM-7AM, please contact night-coverage [email or web address] Password [email or web address] 10/31/2021, 9:02 AM   LOS: 1 day

## 2021-11-01 DIAGNOSIS — Z720 Tobacco use: Secondary | ICD-10-CM | POA: Diagnosis not present

## 2021-11-01 DIAGNOSIS — E1010 Type 1 diabetes mellitus with ketoacidosis without coma: Secondary | ICD-10-CM | POA: Diagnosis not present

## 2021-11-01 DIAGNOSIS — I5022 Chronic systolic (congestive) heart failure: Secondary | ICD-10-CM | POA: Diagnosis not present

## 2021-11-01 LAB — BASIC METABOLIC PANEL
Anion gap: 5 (ref 5–15)
BUN: 12 mg/dL (ref 6–20)
CO2: 29 mmol/L (ref 22–32)
Calcium: 8 mg/dL — ABNORMAL LOW (ref 8.9–10.3)
Chloride: 99 mmol/L (ref 98–111)
Creatinine, Ser: 0.62 mg/dL (ref 0.44–1.00)
GFR, Estimated: >60 mL/min (ref >60)
Glucose, Bld: 296 mg/dL — ABNORMAL HIGH (ref 70–99)
Potassium: 3.6 mmol/L (ref 3.5–5.1)
Sodium: 133 mmol/L — ABNORMAL LOW (ref 135–145)

## 2021-11-01 LAB — CBC
HCT: 35.2 % — ABNORMAL LOW (ref 36.0–46.0)
Hemoglobin: 11.3 g/dL — ABNORMAL LOW (ref 12.0–15.0)
MCH: 27 pg (ref 26.0–34.0)
MCHC: 32.1 g/dL (ref 30.0–36.0)
MCV: 84 fL (ref 80.0–100.0)
Platelets: 204 10*3/uL (ref 150–400)
RBC: 4.19 MIL/uL (ref 3.87–5.11)
RDW: 18.4 % — ABNORMAL HIGH (ref 11.5–15.5)
WBC: 8.7 10*3/uL (ref 4.0–10.5)
nRBC: 0 % (ref 0.0–0.2)

## 2021-11-01 LAB — PROCALCITONIN: Procalcitonin: <0.1 ng/mL

## 2021-11-01 LAB — GLUCOSE, CAPILLARY
Glucose-Capillary: 329 mg/dL — ABNORMAL HIGH (ref 70–99)
Glucose-Capillary: 238 mg/dL — ABNORMAL HIGH (ref 70–99)

## 2021-11-01 LAB — MAGNESIUM: Magnesium: 1.6 mg/dL — ABNORMAL LOW (ref 1.7–2.4)

## 2021-11-01 MED ORDER — FUROSEMIDE 20 MG PO TABS: 20.0000 mg | ORAL_TABLET | Freq: Every day | ORAL | 2 refills | Status: DC

## 2021-11-01 MED ORDER — MAGNESIUM SULFATE 2 GM/50ML IV SOLN
2.0000 g | Freq: Once | INTRAVENOUS | Status: AC
  Administered 2021-11-01: 2 g via INTRAVENOUS
  Filled 2021-11-01: qty 50

## 2021-11-01 MED ORDER — NOVOLOG FLEXPEN 100 UNIT/ML ~~LOC~~ SOPN: 1.0000 [IU] | PEN_INJECTOR | Freq: Three times a day (TID) | SUBCUTANEOUS | 1 refills | Status: AC

## 2021-11-01 MED ORDER — LISINOPRIL 5 MG PO TABS: 5.0000 mg | ORAL_TABLET | Freq: Every day | ORAL | 2 refills | Status: AC

## 2021-11-01 MED ORDER — CEFDINIR 300 MG PO CAPS: 300.0000 mg | ORAL_CAPSULE | Freq: Two times a day (BID) | ORAL | 0 refills | Status: DC

## 2021-11-01 MED ORDER — CEFDINIR 300 MG PO CAPS: 300.0000 mg | ORAL_CAPSULE | Freq: Two times a day (BID) | ORAL | Status: DC

## 2021-11-01 MED ORDER — LANTUS SOLOSTAR 100 UNIT/ML ~~LOC~~ SOPN
17.0000 [IU] | PEN_INJECTOR | Freq: Two times a day (BID) | SUBCUTANEOUS | 1 refills | Status: DC
Start: 2021-11-01 — End: 2022-06-01

## 2021-11-01 NOTE — Progress Notes (Signed)
°  Transition of Care [facility]) Screening Note   Patient Details  Name: [name] Date of Birth: 1985/10/30   [facility] of [facility]) CM/SW Contact:    Annice Needy, LCSW Phone Number: 11/01/2021, 10:23 AM    Transition of Care Department [facility]) has reviewed patient and no TOC needs have been identified at this time. We will continue to monitor patient advancement through interdisciplinary progression rounds. If new patient transition needs arise, please place a TOC consult.   Shahzad Thomann, Juleen China, LCSW

## 2021-11-01 NOTE — Progress Notes (Signed)
TOC consulted for medication assistance. [place] has [place] Medicaid. TOC only provided assistance for uninsured patients.  TOC Signing off.    Breann Losano, Juleen China, LCSW

## 2021-11-01 NOTE — Progress Notes (Signed)
Inpatient Diabetes Program Recommendations  AACE/ADA: New Consensus Statement on Inpatient Glycemic Control   Target Ranges:  Prepandial:   less than 140 mg/dL      Peak postprandial:   less than 180 mg/dL (1-2 hours)      Critically ill patients:  140 - 180 mg/dL    Latest Reference Range & Units 10/31/21 08:20 10/31/21 09:43 10/31/21 11:39 10/31/21 16:11 10/31/21 21:30 11/01/21 08:06 11/01/21 11:30  Glucose-Capillary 70 - 99 mg/dL 767 (H) 341 (H) 937 (H) 214 (H) 279 (H) 329 (H) 238 (H)    Latest Reference Range & Units 11/30/20 13:40 03/09/21 10:19 10/30/21 16:45  Hemoglobin A1C 4.8 - 5.6 % 14.7 (H) 11.8 (H) 9.0 (H)   Review of Glycemic Control  Diabetes history: DM1 (makes NO insulin; requires basal, correction, and meal coverage insulin) Outpatient Diabetes medications: Lantus 35 units QHS, Novolog 0-15 units TID for correction and meal coverage (1 unit for 7 grams carbs) Current orders for Inpatient glycemic control: Semglee 15 units daily, Novolog 0-9 units TID with meals, Novolog 0-5 units QHS, Novolog 3 units TID with meals   Inpatient Diabetes Program Recommendations:     Insulin: Please consider increasing Semglee to 19 units daily and meal coverage to Novolog 5 units TID with meals if patient eats at least 50% of meals.   Outpatient: Please provide Rx for Lantus SoloStar pens [phone]) and Novolog Flexpens [phone]) at time of discharge.  NOTE: Spoke with patient over the phone regarding DM and DM medications. Patient states that she ran out of insulin several days prior to admission. She states she is using Lantus 35 units QHS and Novolog 1 unit for 7 grams of carbs, plus additional units for correction. Patient reports she uses insulin pens and she will need Rx for both Lantus and Novolog at discharge. Patient states needs to get follow up appointment with PCP and plans to call after she gets discharged. Patient reports that her glucose had been trending fairly well when she is  able to take the insulin as prescribed. Patient reports she has plenty of insulin pen needles and glucose monitoring supplies. Discussed current A1C of 9% on 10/30/21 which is still not at goal but improved from 11.8% on 03/09/21 and 14.7% on 11/30/20. Discussed A1C and glucose goals. Encouraged patient to be sure to get follow up appointment with PCP and be sure to consistently follow up so she can continue to get need prescriptions. Patient verbalized understanding and states she has no questions or other needs at this time regarding DM.  Thanks, Orlando Penner, RN, MSN, CDE Diabetes Coordinator Inpatient Diabetes Program [phone] (Team Pager from 8am to 5pm)

## 2021-11-02 LAB — URINE CULTURE: Culture: >=100000 — AB

## 2021-11-02 LAB — CULTURE, BLOOD (ROUTINE X 2)

## 2021-11-04 LAB — CULTURE, BLOOD (ROUTINE X 2): Culture: NO GROWTH

## 2021-11-10 ENCOUNTER — Emergency Department (HOSPITAL_COMMUNITY)
Admission: EM | Admit: 2021-11-10 | Discharge: 2021-11-10 | Disposition: A | Discharge status: Home / Self Care | Payer: Medicaid Other | Attending: Emergency Medicine | Admitting: Emergency Medicine

## 2021-11-10 ENCOUNTER — Emergency Department (HOSPITAL_COMMUNITY): Payer: Medicaid Other

## 2021-11-10 ENCOUNTER — Other Ambulatory Visit: Payer: Self-pay

## 2021-11-10 DIAGNOSIS — L03115 Cellulitis of right lower limb: Secondary | ICD-10-CM | POA: Insufficient documentation

## 2021-11-10 DIAGNOSIS — Z794 Long term (current) use of insulin: Secondary | ICD-10-CM | POA: Diagnosis not present

## 2021-11-10 DIAGNOSIS — R52 Pain, unspecified: Secondary | ICD-10-CM

## 2021-11-10 DIAGNOSIS — W25XXXA Contact with sharp glass, initial encounter: Secondary | ICD-10-CM | POA: Insufficient documentation

## 2021-11-10 DIAGNOSIS — E119 Type 2 diabetes mellitus without complications: Secondary | ICD-10-CM | POA: Insufficient documentation

## 2021-11-10 DIAGNOSIS — L089 Local infection of the skin and subcutaneous tissue, unspecified: Secondary | ICD-10-CM | POA: Diagnosis present

## 2021-11-10 LAB — CBC WITH DIFFERENTIAL/PLATELET
Abs Immature Granulocytes: 0.07 10*3/uL (ref 0.00–0.07)
Basophils Absolute: 0 10*3/uL (ref 0.0–0.1)
Basophils Relative: 0 %
Eosinophils Absolute: 0.1 10*3/uL (ref 0.0–0.5)
Eosinophils Relative: 1 %
HCT: 38.5 % (ref 36.0–46.0)
Hemoglobin: 12.4 g/dL (ref 12.0–15.0)
Immature Granulocytes: 1 %
Lymphocytes Relative: 10 %
Lymphs Abs: 1 10*3/uL (ref 0.7–4.0)
MCH: 27 pg (ref 26.0–34.0)
MCHC: 32.2 g/dL (ref 30.0–36.0)
MCV: 83.9 fL (ref 80.0–100.0)
Monocytes Absolute: 0.8 10*3/uL (ref 0.1–1.0)
Monocytes Relative: 8 %
Neutro Abs: 8.9 10*3/uL — ABNORMAL HIGH (ref 1.7–7.7)
Neutrophils Relative %: 80 %
Platelets: 485 10*3/uL — ABNORMAL HIGH (ref 150–400)
RBC: 4.59 MIL/uL (ref 3.87–5.11)
RDW: 19 % — ABNORMAL HIGH (ref 11.5–15.5)
WBC: 10.9 10*3/uL — ABNORMAL HIGH (ref 4.0–10.5)
nRBC: 0 % (ref 0.0–0.2)

## 2021-11-10 LAB — BASIC METABOLIC PANEL
Anion gap: 9 (ref 5–15)
BUN: 18 mg/dL (ref 6–20)
CO2: 26 mmol/L (ref 22–32)
Calcium: 8.5 mg/dL — ABNORMAL LOW (ref 8.9–10.3)
Chloride: 93 mmol/L — ABNORMAL LOW (ref 98–111)
Creatinine, Ser: 1.03 mg/dL — ABNORMAL HIGH (ref 0.44–1.00)
GFR, Estimated: >60 mL/min (ref >60)
Glucose, Bld: 417 mg/dL — ABNORMAL HIGH (ref 70–99)
Potassium: 3.9 mmol/L (ref 3.5–5.1)
Sodium: 128 mmol/L — ABNORMAL LOW (ref 135–145)

## 2021-11-10 LAB — CBG MONITORING, ED: Glucose-Capillary: 348 mg/dL — ABNORMAL HIGH (ref 70–99)

## 2021-11-10 MED ORDER — DOXYCYCLINE HYCLATE 100 MG PO CAPS: 100.0000 mg | ORAL_CAPSULE | Freq: Two times a day (BID) | ORAL | 0 refills | Status: AC

## 2021-11-10 NOTE — ED Triage Notes (Signed)
Patient states she stepped on glass 3 days ago and now has glass embedded in left foot. Patient has redness and swelling to greater first toe. Hx of DM.

## 2021-11-10 NOTE — Discharge Instructions (Addendum)
It was a pleasure taking care of you today!   Your xray was negative and your labs were unremarkable. You will be sent a prescription for doxycycline. Take as prescribed. You may also take 600 mg ibuprofen or 1,000 mg tylenol every 6 hours as needed for pain. You may follow up with your primary care provider as needed. Return to the ED if you are experiencing increasing/worsening fever, redness spreading, drainage, or worsening symptoms.

## 2021-11-10 NOTE — ED Notes (Signed)
Pt transported to xray 

## 2021-11-10 NOTE — ED Provider Notes (Signed)
[facility] EMERGENCY DEPARTMENT Provider Note   CSN: [id number] Arrival date & time: 11/10/21  1206     History  Chief Complaint  Patient presents with   Wound Infection    [name] is a 36 y.o. female with a past medical history of diabetes, who presents to the ED complaining of wound infection to right foot onset 3 days ago.  Patient notes that she stepped on glass and did not have the glass removed from her left foot.  She denies being evaluated at the time of the incident.  Patient notes she has a history of diabetes.  Patient has associated redness and pain to right foot.  Has not tried medication for symptoms.  Denies fever, chills, nausea, vomiting.   The history is provided by the patient. No language interpreter was used.      Home Medications Prior to Admission medications   Medication Sig Start Date End Date Taking? Authorizing Provider  doxycycline (VIBRAMYCIN) 100 MG capsule Take 1 capsule (100 mg total) by mouth 2 (two) times daily for 5 days. 11/10/21 11/15/21 Yes Deanglo Hissong A, PA-C  carvedilol (COREG) 6.25 MG tablet Take 1 tablet (6.25 mg total) by mouth 2 (two) times daily with a meal. Increased from 3.125 mg. Patient not taking: Reported on 10/30/2021 12/31/20 03/31/21  Darlin Priestly, MD  cefdinir (OMNICEF) 300 MG capsule Take 1 capsule (300 mg total) by mouth every 12 (twelve) hours. 11/01/21   Catarina Hartshorn, MD  furosemide (LASIX) 20 MG tablet Take 1 tablet (20 mg total) by mouth daily. 11/01/21 01/30/22  Catarina Hartshorn, MD  insulin aspart (NOVOLOG FLEXPEN) 100 UNIT/ML FlexPen Inject 1-15 Units into the skin 3 (three) times daily with meals. Dose based on CBG and carb count, 1 unit for every 15 carb. 11/01/21   Catarina Hartshorn, MD  insulin glargine (LANTUS SOLOSTAR) 100 UNIT/ML Solostar Pen Inject 17 Units into the skin 2 (two) times daily. 11/01/21 10/27/22  Catarina Hartshorn, MD  lisinopril (ZESTRIL) 5 MG tablet Take 1 tablet (5 mg total) by mouth daily. New medication for your heart.  11/01/21 01/30/22  Catarina Hartshorn, MD      Allergies    Codeine, Penicillins, and Tramadol    Review of Systems   Review of Systems  Constitutional:  Negative for chills and fever.  Gastrointestinal:  Negative for nausea and vomiting.  Musculoskeletal:  Positive for arthralgias. Negative for joint swelling.  Skin:  Positive for color change and wound.  All other systems reviewed and are negative.  Physical Exam Updated Vital Signs BP 119/82    Pulse 80    Temp 98.5 F (36.9 C) (Oral)    Resp 15    Ht 5' (1.524 m)    Wt 47.6 kg    LMP 10/16/2021    SpO2 98%    BMI 20.51 kg/m  Physical Exam Vitals and nursing note reviewed.  Constitutional:      General: She is not in acute distress.    Appearance: Normal appearance.  Eyes:     General: No scleral icterus.    Extraocular Movements: Extraocular movements intact.  Cardiovascular:     Rate and Rhythm: Normal rate.  Pulmonary:     Effort: Pulmonary effort is normal. No respiratory distress.  Musculoskeletal:     Cervical back: Neck supple.     Right ankle: Normal.     Left ankle: Normal.     Right foot: Normal range of motion and normal capillary refill. Tenderness present. No  swelling or bony tenderness. Normal pulse.     Left foot: Normal.     Comments: Mild tenderness to palpation to dorsal aspect of right foot.  Area of erythema noted to dorsal aspect of right foot.  No obvious swelling noted.  Full active range of motion of bilateral feet.  DP and PT pulses intact bilaterally.  No tenderness to palpation noted to bilateral ankles.  See picture below.  Skin:    General: Skin is warm and dry.     Findings: No bruising, erythema or rash.  Neurological:     Mental Status: She is alert.  Psychiatric:        Behavior: Behavior normal.       ED Results / Procedures / Treatments   Labs (all labs ordered are listed, but only abnormal results are displayed) Labs Reviewed  BASIC METABOLIC PANEL - Abnormal; Notable for the  following components:      Result Value   Sodium 128 (*)    Chloride 93 (*)    Glucose, Bld 417 (*)    Creatinine, Ser 1.03 (*)    Calcium 8.5 (*)    All other components within normal limits  CBC WITH DIFFERENTIAL/PLATELET - Abnormal; Notable for the following components:   WBC 10.9 (*)    RDW 19.0 (*)    Platelets 485 (*)    Neutro Abs 8.9 (*)    All other components within normal limits  CBG MONITORING, ED - Abnormal; Notable for the following components:   Glucose-Capillary 348 (*)    All other components within normal limits    EKG None  Radiology DG Foot 2 Views Right  Result Date: 11/10/2021 CLINICAL DATA:  Stepped on glass on bottom of right foot, pain and swelling in general footpain EXAM: RIGHT FOOT - 2 VIEW COMPARISON:  None. FINDINGS: No fracture or dislocation of mid foot or forefoot. The phalanges are normal. The calcaneus is normal. No soft tissue abnormality. IMPRESSION: No radiodense foreign body.  No osseous abnormality Electronically Signed   By: Suzy Bouchard M.D.   On: 11/10/2021 14:07    Procedures Procedures    Medications Ordered in ED Medications - No data to display  ED Course/ Medical Decision Making/ A&P Clinical Course as of 11/10/21 1554  Wed Nov 10, 2021  1334 Re-evaluated and noted improvement of symptoms with treatment regimen. Discussed discharge treatment plan. Pt agreeable at this time. Pt appears safe for discharge. [SB]  1514 Glucose(!): 417 [SB]  1514 Sodium(!): 128 Sodium correction for hyperglycemia found sodium to be at 136. [SB]  1515 WBC(!): 10.9 [SB]    Clinical Course User Index [SB] Dashiel Bergquist A, PA-C                           Medical Decision Making Amount and/or Complexity of Data Reviewed Labs: ordered. Decision-making details documented in ED Course. Radiology: ordered.  Risk Prescription drug management.   Patient presents to the ED complaining of wound infection to left foot onset 3 days.  Patient has a  history of diabetes that is managed with insulin.  She notes that she took her insulin this morning prior to arrival to the ED.  Vital signs stable, patient afebrile.  On exam patient with mild tenderness to palpation to dorsal aspect of right foot with area of erythema noted.  No obvious swelling noted.  Full active range of motion of bilateral feet.  Differential diagnosis includes fracture,  dislocation, cellulitis, abscess, foreign body, osteomyelitis.    Labs:  I ordered, and personally interpreted labs.  The pertinent results include:  CBC with mildly elevated WBC at 10.9. CBG with glucose at 348. BMP with glucose at 417. Creatinine mildly elevated at 1.03. Sodium at 128.  Sodium correction for hyperglycemia found sodium to be at 136. No evidence of acidosis noted on labs.  Imaging: I ordered imaging studies including right foot x-ray I independently visualized and interpreted imaging which showed: No acute fracture, dislocation, foreign body. I agree with the radiologist interpretation   Disposition: Patient presentation suspicious for cellulitis.  Doubt fracture, dislocation, foreign body, osteomyelitis as cause of symptoms.  Patient not in acidosis at this time.  Sodium corrected to 136.  After consideration of the diagnostic results and the patients response to treatment, I feel that the patient would benefit from discharge home with doxycycline prescription. Supportive care measures and strict return precautions discussed with patient at bedside. Pt acknowledges and verbalizes understanding. Pt appears safe for discharge. Follow up as indicated in discharge paperwork.   This chart was dictated using voice recognition software, Dragon. Despite the best efforts of this provider to proofread and correct errors, errors may still occur which can change documentation meaning.  Final Clinical Impression(s) / ED Diagnoses Final diagnoses:  Cellulitis of right foot    Rx / DC Orders ED  Discharge Orders          Ordered    doxycycline (VIBRAMYCIN) 100 MG capsule  2 times daily        11/10/21 1513              Raseel Jans A, PA-C 11/10/21 1554    Noemi Chapel, MD 11/20/21 1451

## 2021-11-10 NOTE — ED Notes (Signed)
Pt d/c home per MD order. Discharge summary reviewed with pt, pt verbalizes understanding. Ambulatory off unit. No s/s of acute distress noted at discharge.  °

## 2021-11-14 ENCOUNTER — Encounter: Payer: Self-pay | Admitting: Emergency Medicine

## 2021-11-14 ENCOUNTER — Emergency Department
Admission: EM | Admit: 2021-11-14 | Discharge: 2021-11-14 | Disposition: A | Discharge status: Home / Self Care | Payer: Medicaid Other | Attending: Emergency Medicine | Admitting: Emergency Medicine

## 2021-11-14 ENCOUNTER — Emergency Department: Payer: Medicaid Other

## 2021-11-14 ENCOUNTER — Other Ambulatory Visit: Payer: Self-pay

## 2021-11-14 DIAGNOSIS — E119 Type 2 diabetes mellitus without complications: Secondary | ICD-10-CM | POA: Insufficient documentation

## 2021-11-14 DIAGNOSIS — L089 Local infection of the skin and subcutaneous tissue, unspecified: Secondary | ICD-10-CM

## 2021-11-14 DIAGNOSIS — L03115 Cellulitis of right lower limb: Secondary | ICD-10-CM | POA: Insufficient documentation

## 2021-11-14 DIAGNOSIS — I110 Hypertensive heart disease with heart failure: Secondary | ICD-10-CM | POA: Diagnosis not present

## 2021-11-14 DIAGNOSIS — M79671 Pain in right foot: Secondary | ICD-10-CM | POA: Diagnosis present

## 2021-11-14 DIAGNOSIS — S91331A Puncture wound without foreign body, right foot, initial encounter: Secondary | ICD-10-CM

## 2021-11-14 DIAGNOSIS — I509 Heart failure, unspecified: Secondary | ICD-10-CM | POA: Insufficient documentation

## 2021-11-14 LAB — CBC WITH DIFFERENTIAL/PLATELET
Abs Immature Granulocytes: 0.06 10*3/uL (ref 0.00–0.07)
Basophils Absolute: 0 10*3/uL (ref 0.0–0.1)
Basophils Relative: 0 %
Eosinophils Absolute: 0.1 10*3/uL (ref 0.0–0.5)
Eosinophils Relative: 1 %
HCT: 36.1 % (ref 36.0–46.0)
Hemoglobin: 11.7 g/dL — ABNORMAL LOW (ref 12.0–15.0)
Immature Granulocytes: 1 %
Lymphocytes Relative: 27 %
Lymphs Abs: 2.7 10*3/uL (ref 0.7–4.0)
MCH: 28.1 pg (ref 26.0–34.0)
MCHC: 32.4 g/dL (ref 30.0–36.0)
MCV: 86.6 fL (ref 80.0–100.0)
Monocytes Absolute: 0.4 10*3/uL (ref 0.1–1.0)
Monocytes Relative: 4 %
Neutro Abs: 6.7 10*3/uL (ref 1.7–7.7)
Neutrophils Relative %: 67 %
Platelets: 474 10*3/uL — ABNORMAL HIGH (ref 150–400)
RBC: 4.17 MIL/uL (ref 3.87–5.11)
RDW: 19.2 % — ABNORMAL HIGH (ref 11.5–15.5)
WBC: 10.1 10*3/uL (ref 4.0–10.5)
nRBC: 0 % (ref 0.0–0.2)

## 2021-11-14 LAB — BASIC METABOLIC PANEL
Anion gap: 5 (ref 5–15)
BUN: 11 mg/dL (ref 6–20)
CO2: 26 mmol/L (ref 22–32)
Calcium: 8 mg/dL — ABNORMAL LOW (ref 8.9–10.3)
Chloride: 103 mmol/L (ref 98–111)
Creatinine, Ser: 0.58 mg/dL (ref 0.44–1.00)
GFR, Estimated: >60 mL/min (ref >60)
Glucose, Bld: 264 mg/dL — ABNORMAL HIGH (ref 70–99)
Potassium: 3.5 mmol/L (ref 3.5–5.1)
Sodium: 134 mmol/L — ABNORMAL LOW (ref 135–145)

## 2021-11-14 MED ORDER — CEPHALEXIN 500 MG PO CAPS: 500.0000 mg | ORAL_CAPSULE | Freq: Three times a day (TID) | ORAL | 0 refills | Status: DC

## 2021-11-14 MED ORDER — CIPROFLOXACIN HCL 500 MG PO TABS: 500.0000 mg | ORAL_TABLET | Freq: Two times a day (BID) | ORAL | 0 refills | Status: AC

## 2021-11-14 MED ORDER — CIPROFLOXACIN HCL 500 MG PO TABS
500.0000 mg | ORAL_TABLET | Freq: Once | ORAL | Status: AC
  Administered 2021-11-14: 500 mg via ORAL
  Filled 2021-11-14: qty 1

## 2021-11-14 MED ORDER — CEPHALEXIN 500 MG PO CAPS
500.0000 mg | ORAL_CAPSULE | Freq: Once | ORAL | Status: AC
Start: 2021-11-14 — End: 2021-11-14
  Administered 2021-11-14: 500 mg via ORAL
  Filled 2021-11-14: qty 1

## 2021-11-14 NOTE — ED Triage Notes (Signed)
Pt called for triage, no response. 

## 2021-11-14 NOTE — ED Provider Notes (Signed)
[facility] Provider Note    Event Date/Time   First MD Initiated Contact with Patient 11/14/21 1349     (approximate)  History   Chief Complaint: Foot Swelling  HPI  [name] is a 36 y.o. female with a past medical history of CHF, diabetes, hypertension, presents to the emergency department for right foot pain.  According to the patient approximately 3 weeks ago she stepped on broken glass.  States she got the glass out of the foot but approximately 1 week later developed some redness and swelling to the right foot.  Patient states she went to urgent care 3 days ago and had an x-ray done that did not show any glass or foreign bodies in the foot.  Patient was started on doxycycline which she has been taking.  She was told by the urgent care if it is not getting better in the next 2 days or so she should come to the emergency department for evaluation.  Patient states the redness has not improved but is no longer getting any worse either.  Patient denies any fever at any point.  Patient denies any history of IV drug use.  Physical Exam   Triage Vital Signs: ED Triage Vitals  Enc Vitals Group     BP 11/14/21 1317 137/89     Pulse Rate 11/14/21 1317 (!) 102     Resp 11/14/21 1317 16     Temp 11/14/21 1317 98.4 F (36.9 C)     Temp Source 11/14/21 1317 Oral     SpO2 11/14/21 1317 100 %     Weight 11/14/21 1314 105 lb (47.6 kg)     Height 11/14/21 1314 5' (1.524 m)     Head Circumference --      Peak Flow --      Pain Score 11/14/21 1314 8     Pain Loc --      Pain Edu? --      Excl. in GC? --     Most recent vital signs: Vitals:   11/14/21 1317  BP: 137/89  Pulse: (!) 102  Resp: 16  Temp: 98.4 F (36.9 C)  SpO2: 100%    General: Awake, no distress.  CV:  Good peripheral perfusion.  Regular rate and rhythm  Resp:  Normal effort.  Equal breath sounds bilaterally.  Abd:  No distention.  Soft, nontender.  No rebound or  guarding. Other:  Patient does have moderate swelling to the right foot with tenderness to the plantar and dorsal aspect with erythema to the dorsal aspect.   ED Results / Procedures / Treatments   RADIOLOGY  I personally reviewed the CT images, no foreign body identified my evaluation. CT is read as a possible tiny radiopaque density along the plantar surface at the level of the fourth metatarsal head, on examination patient has a small scab to this area but nothing easily identified as foreign body.   MEDICATIONS ORDERED IN ED: Medications - No data to display   IMPRESSION / MDM / ASSESSMENT AND PLAN / ED COURSE  I reviewed the triage vital signs and the nursing notes.  Patient presents emergency department for redness and swelling of the right foot.  Stepped on a foreign body 3 weeks ago but she states she believes she got all the glass out of the foot.  Patient is started on doxycycline 2 days ago and had negative x-ray 3 days ago.  Overall the patient appears well, no distress.  Reassuringly no fever with normal vitals and overall reassuring lab work including a normal white blood cell count of 10.0, chemistry is reassuring besides mild hyperglycemia however patient takes medications for diabetes.  Creatinine is normal.  We will obtain a CT scan of the right foot to rule out foreign body, patient agreeable to plan of care  Patient CT scan shows possible tiny superficial foreign body although no definitive foreign body.  Patient was only taking doxycycline, she states no worsening over the past 3 days of taking it no fever, lab work is reassuring.  Given the patient's reassuring physical exam CT and exam I believe the patient would be safe for discharge home covering with Keflex and ciprofloxacin to cover for staph/strep as well as Pseudomonas.  I also discussed with patient the need to follow-up with the podiatrist for continued monitoring and recheck/reevaluation.  Also discussed return  precautions for any worsening pain increased redness or fever.  Patient agreeable to plan of care.  FINAL CLINICAL IMPRESSION(S) / ED DIAGNOSES   Right foot pain Cellulitis of the right foot  Rx / DC Orders   Keflex Ciprofloxacin Norco Podiatry follow-up  Note:  This document was prepared using Dragon voice recognition software and may include unintentional dictation errors.   Minna Antis, MD 11/14/21 1558

## 2021-11-14 NOTE — Discharge Instructions (Addendum)
Please take antibiotics as prescribed for their entire course.  Please take pain medication as needed but only as prescribed.  Please follow-up with podiatry by calling the number provided to arrange a follow-up appointment.  Return to the emergency department for any increased pain redness or fever or any other symptom personally concerning to yourself.

## 2021-11-14 NOTE — ED Notes (Signed)
Patient declined discharge vital signs. 

## 2021-11-14 NOTE — ED Triage Notes (Signed)
Pt to ED via POV c/o right foot swelling. Pt states that she was seen in ED on Thursday and given abx but that her foot has not gotten any better.

## 2022-05-26 ENCOUNTER — Other Ambulatory Visit: Payer: Self-pay

## 2022-05-26 DIAGNOSIS — I110 Hypertensive heart disease with heart failure: Secondary | ICD-10-CM | POA: Diagnosis present

## 2022-05-26 DIAGNOSIS — E1069 Type 1 diabetes mellitus with other specified complication: Secondary | ICD-10-CM | POA: Diagnosis present

## 2022-05-26 DIAGNOSIS — Z79899 Other long term (current) drug therapy: Secondary | ICD-10-CM

## 2022-05-26 DIAGNOSIS — E1065 Type 1 diabetes mellitus with hyperglycemia: Secondary | ICD-10-CM | POA: Diagnosis present

## 2022-05-26 DIAGNOSIS — E440 Moderate protein-calorie malnutrition: Secondary | ICD-10-CM | POA: Diagnosis present

## 2022-05-26 DIAGNOSIS — Z880 Allergy status to penicillin: Secondary | ICD-10-CM

## 2022-05-26 DIAGNOSIS — B9620 Unspecified Escherichia coli [E. coli] as the cause of diseases classified elsewhere: Secondary | ICD-10-CM | POA: Diagnosis present

## 2022-05-26 DIAGNOSIS — K529 Noninfective gastroenteritis and colitis, unspecified: Secondary | ICD-10-CM | POA: Diagnosis present

## 2022-05-26 DIAGNOSIS — Q524 Other congenital malformations of vagina: Secondary | ICD-10-CM

## 2022-05-26 DIAGNOSIS — Z885 Allergy status to narcotic agent status: Secondary | ICD-10-CM

## 2022-05-26 DIAGNOSIS — Z794 Long term (current) use of insulin: Secondary | ICD-10-CM

## 2022-05-26 DIAGNOSIS — Z681 Body mass index (BMI) 19 or less, adult: Secondary | ICD-10-CM

## 2022-05-26 DIAGNOSIS — L0231 Cutaneous abscess of buttock: Principal | ICD-10-CM | POA: Diagnosis present

## 2022-05-26 DIAGNOSIS — Z8249 Family history of ischemic heart disease and other diseases of the circulatory system: Secondary | ICD-10-CM

## 2022-05-26 DIAGNOSIS — I5022 Chronic systolic (congestive) heart failure: Secondary | ICD-10-CM | POA: Diagnosis present

## 2022-05-26 DIAGNOSIS — F1410 Cocaine abuse, uncomplicated: Secondary | ICD-10-CM | POA: Diagnosis present

## 2022-05-26 DIAGNOSIS — N136 Pyonephrosis: Secondary | ICD-10-CM | POA: Diagnosis present

## 2022-05-26 DIAGNOSIS — K219 Gastro-esophageal reflux disease without esophagitis: Secondary | ICD-10-CM | POA: Diagnosis present

## 2022-05-26 DIAGNOSIS — E876 Hypokalemia: Secondary | ICD-10-CM | POA: Diagnosis present

## 2022-05-26 DIAGNOSIS — F17210 Nicotine dependence, cigarettes, uncomplicated: Secondary | ICD-10-CM | POA: Diagnosis present

## 2022-05-26 LAB — BASIC METABOLIC PANEL
Anion gap: 11 (ref 5–15)
BUN: 34 mg/dL — ABNORMAL HIGH (ref 6–20)
CO2: 29 mmol/L (ref 22–32)
Calcium: 8.9 mg/dL (ref 8.9–10.3)
Chloride: 89 mmol/L — ABNORMAL LOW (ref 98–111)
Creatinine, Ser: 0.98 mg/dL (ref 0.44–1.00)
GFR, Estimated: >60 mL/min (ref >60)
Glucose, Bld: 663 mg/dL (ref 70–99)
Potassium: 4.7 mmol/L (ref 3.5–5.1)
Sodium: 129 mmol/L — ABNORMAL LOW (ref 135–145)

## 2022-05-26 LAB — URINALYSIS, ROUTINE W REFLEX MICROSCOPIC
Bilirubin Urine: NEGATIVE
Glucose, UA: >=500 mg/dL — AB
Ketones, ur: NEGATIVE mg/dL
Nitrite: NEGATIVE
Protein, ur: 30 mg/dL — AB
Specific Gravity, Urine: 1.027 (ref 1.005–1.030)
WBC, UA: >50 WBC/hpf — ABNORMAL HIGH (ref 0–5)
pH: 7 (ref 5.0–8.0)

## 2022-05-26 LAB — CBC
HCT: 44.9 % (ref 36.0–46.0)
Hemoglobin: 14.7 g/dL (ref 12.0–15.0)
MCH: 27.8 pg (ref 26.0–34.0)
MCHC: 32.7 g/dL (ref 30.0–36.0)
MCV: 85 fL (ref 80.0–100.0)
Platelets: 272 10*3/uL (ref 150–400)
RBC: 5.28 MIL/uL — ABNORMAL HIGH (ref 3.87–5.11)
RDW: 14.6 % (ref 11.5–15.5)
WBC: 12.9 10*3/uL — ABNORMAL HIGH (ref 4.0–10.5)
nRBC: 0 % (ref 0.0–0.2)

## 2022-05-26 LAB — CBG MONITORING, ED: Glucose-Capillary: >600 mg/dL (ref 70–99)

## 2022-05-26 NOTE — ED Triage Notes (Addendum)
Pt comes from home via POV c/o of an abscess on right of buttock that she found 3 days ago. Pt also states that her blood sugar was "very high" this morning when she checked it around 1000 am. Pt states "she just doesn't feel good"  CBG reads "high"

## 2022-05-27 ENCOUNTER — Inpatient Hospital Stay
Admission: EM | Admit: 2022-05-27 | Discharge: 2022-06-01 | DRG: 603 | Disposition: A | Discharge status: Home / Self Care | Payer: Medicaid Other | Attending: Internal Medicine | Admitting: Internal Medicine

## 2022-05-27 ENCOUNTER — Encounter: Payer: Self-pay | Admitting: Internal Medicine

## 2022-05-27 ENCOUNTER — Emergency Department: Payer: Medicaid Other

## 2022-05-27 DIAGNOSIS — Q524 Other congenital malformations of vagina: Secondary | ICD-10-CM | POA: Diagnosis not present

## 2022-05-27 DIAGNOSIS — L0231 Cutaneous abscess of buttock: Secondary | ICD-10-CM | POA: Diagnosis present

## 2022-05-27 DIAGNOSIS — E43 Unspecified severe protein-calorie malnutrition: Secondary | ICD-10-CM | POA: Diagnosis not present

## 2022-05-27 DIAGNOSIS — I110 Hypertensive heart disease with heart failure: Secondary | ICD-10-CM | POA: Diagnosis present

## 2022-05-27 DIAGNOSIS — I5022 Chronic systolic (congestive) heart failure: Secondary | ICD-10-CM

## 2022-05-27 DIAGNOSIS — Z681 Body mass index (BMI) 19 or less, adult: Secondary | ICD-10-CM | POA: Diagnosis not present

## 2022-05-27 DIAGNOSIS — Z79899 Other long term (current) drug therapy: Secondary | ICD-10-CM | POA: Diagnosis not present

## 2022-05-27 DIAGNOSIS — Z794 Long term (current) use of insulin: Secondary | ICD-10-CM | POA: Diagnosis not present

## 2022-05-27 DIAGNOSIS — E1065 Type 1 diabetes mellitus with hyperglycemia: Secondary | ICD-10-CM | POA: Diagnosis present

## 2022-05-27 DIAGNOSIS — I10 Essential (primary) hypertension: Secondary | ICD-10-CM

## 2022-05-27 DIAGNOSIS — K529 Noninfective gastroenteritis and colitis, unspecified: Secondary | ICD-10-CM | POA: Diagnosis present

## 2022-05-27 DIAGNOSIS — N10 Acute tubulo-interstitial nephritis: Secondary | ICD-10-CM

## 2022-05-27 DIAGNOSIS — N1330 Unspecified hydronephrosis: Secondary | ICD-10-CM | POA: Diagnosis not present

## 2022-05-27 DIAGNOSIS — N3000 Acute cystitis without hematuria: Secondary | ICD-10-CM

## 2022-05-27 DIAGNOSIS — E1069 Type 1 diabetes mellitus with other specified complication: Secondary | ICD-10-CM

## 2022-05-27 DIAGNOSIS — N136 Pyonephrosis: Secondary | ICD-10-CM | POA: Diagnosis present

## 2022-05-27 DIAGNOSIS — R739 Hyperglycemia, unspecified: Principal | ICD-10-CM

## 2022-05-27 DIAGNOSIS — Z885 Allergy status to narcotic agent status: Secondary | ICD-10-CM | POA: Diagnosis not present

## 2022-05-27 DIAGNOSIS — F1410 Cocaine abuse, uncomplicated: Secondary | ICD-10-CM | POA: Diagnosis present

## 2022-05-27 DIAGNOSIS — L0291 Cutaneous abscess, unspecified: Secondary | ICD-10-CM

## 2022-05-27 DIAGNOSIS — Z720 Tobacco use: Secondary | ICD-10-CM | POA: Diagnosis not present

## 2022-05-27 DIAGNOSIS — N390 Urinary tract infection, site not specified: Secondary | ICD-10-CM | POA: Insufficient documentation

## 2022-05-27 DIAGNOSIS — E876 Hypokalemia: Secondary | ICD-10-CM | POA: Diagnosis present

## 2022-05-27 DIAGNOSIS — A419 Sepsis, unspecified organism: Secondary | ICD-10-CM

## 2022-05-27 DIAGNOSIS — I5021 Acute systolic (congestive) heart failure: Secondary | ICD-10-CM | POA: Diagnosis present

## 2022-05-27 DIAGNOSIS — B9620 Unspecified Escherichia coli [E. coli] as the cause of diseases classified elsewhere: Secondary | ICD-10-CM | POA: Diagnosis present

## 2022-05-27 DIAGNOSIS — R222 Localized swelling, mass and lump, trunk: Secondary | ICD-10-CM | POA: Diagnosis present

## 2022-05-27 DIAGNOSIS — K219 Gastro-esophageal reflux disease without esophagitis: Secondary | ICD-10-CM | POA: Diagnosis present

## 2022-05-27 DIAGNOSIS — Z880 Allergy status to penicillin: Secondary | ICD-10-CM | POA: Diagnosis not present

## 2022-05-27 DIAGNOSIS — E440 Moderate protein-calorie malnutrition: Secondary | ICD-10-CM | POA: Diagnosis present

## 2022-05-27 DIAGNOSIS — F17210 Nicotine dependence, cigarettes, uncomplicated: Secondary | ICD-10-CM | POA: Diagnosis present

## 2022-05-27 DIAGNOSIS — Z8249 Family history of ischemic heart disease and other diseases of the circulatory system: Secondary | ICD-10-CM | POA: Diagnosis not present

## 2022-05-27 LAB — PROTIME-INR
INR: 0.9 (ref 0.8–1.2)
Prothrombin Time: 11.6 seconds (ref 11.4–15.2)

## 2022-05-27 LAB — BASIC METABOLIC PANEL
Anion gap: 3 — ABNORMAL LOW (ref 5–15)
BUN: 19 mg/dL (ref 6–20)
CO2: 31 mmol/L (ref 22–32)
Calcium: 7.8 mg/dL — ABNORMAL LOW (ref 8.9–10.3)
Chloride: 100 mmol/L (ref 98–111)
Creatinine, Ser: 0.53 mg/dL (ref 0.44–1.00)
GFR, Estimated: >60 mL/min (ref >60)
Glucose, Bld: 138 mg/dL — ABNORMAL HIGH (ref 70–99)
Potassium: 3 mmol/L — ABNORMAL LOW (ref 3.5–5.1)
Sodium: 134 mmol/L — ABNORMAL LOW (ref 135–145)

## 2022-05-27 LAB — HEPATIC FUNCTION PANEL
ALT: 44 U/L (ref 0–44)
AST: 58 U/L — ABNORMAL HIGH (ref 15–41)
Albumin: 3.8 g/dL (ref 3.5–5.0)
Alkaline Phosphatase: 118 U/L (ref 38–126)
Bilirubin, Direct: <0.1 mg/dL (ref 0.0–0.2)
Total Bilirubin: 0.5 mg/dL (ref 0.3–1.2)
Total Protein: 8 g/dL (ref 6.5–8.1)

## 2022-05-27 LAB — HEMOGLOBIN A1C
Hgb A1c MFr Bld: 14.9 % — ABNORMAL HIGH (ref 4.8–5.6)
Mean Plasma Glucose: 380.93 mg/dL

## 2022-05-27 LAB — BLOOD GAS, VENOUS
Acid-Base Excess: 5.5 mmol/L — ABNORMAL HIGH (ref 0.0–2.0)
Bicarbonate: 31.1 mmol/L — ABNORMAL HIGH (ref 20.0–28.0)
O2 Saturation: 43.3 %
Patient temperature: 37
pCO2, Ven: 48 mmHg (ref 44–60)
pH, Ven: 7.42 (ref 7.25–7.43)
pO2, Ven: <31 mmHg — CL (ref 32–45)

## 2022-05-27 LAB — PREGNANCY, URINE: Preg Test, Ur: NEGATIVE

## 2022-05-27 LAB — MAGNESIUM
Magnesium: 2.2 mg/dL (ref 1.7–2.4)
Magnesium: 1.7 mg/dL (ref 1.7–2.4)

## 2022-05-27 LAB — CBC
HCT: 33.5 % — ABNORMAL LOW (ref 36.0–46.0)
Hemoglobin: 11.2 g/dL — ABNORMAL LOW (ref 12.0–15.0)
MCH: 28.2 pg (ref 26.0–34.0)
MCHC: 33.4 g/dL (ref 30.0–36.0)
MCV: 84.4 fL (ref 80.0–100.0)
Platelets: 187 10*3/uL (ref 150–400)
RBC: 3.97 MIL/uL (ref 3.87–5.11)
RDW: 14.4 % (ref 11.5–15.5)
WBC: 10.7 10*3/uL — ABNORMAL HIGH (ref 4.0–10.5)
nRBC: 0 % (ref 0.0–0.2)

## 2022-05-27 LAB — URINE DRUG SCREEN, QUALITATIVE (ARMC ONLY)
Amphetamines, Ur Screen: NOT DETECTED
Amphetamines, Ur Screen: POSITIVE — AB
Barbiturates, Ur Screen: NOT DETECTED
Barbiturates, Ur Screen: NOT DETECTED
Benzodiazepine, Ur Scrn: NOT DETECTED
Benzodiazepine, Ur Scrn: NOT DETECTED
Cannabinoid 50 Ng, Ur ~~LOC~~: NOT DETECTED
Cannabinoid 50 Ng, Ur ~~LOC~~: NOT DETECTED
Cocaine Metabolite,Ur ~~LOC~~: POSITIVE — AB
Cocaine Metabolite,Ur ~~LOC~~: POSITIVE — AB
MDMA (Ecstasy)Ur Screen: NOT DETECTED
MDMA (Ecstasy)Ur Screen: NOT DETECTED
Methadone Scn, Ur: NOT DETECTED
Methadone Scn, Ur: NOT DETECTED
Opiate, Ur Screen: NOT DETECTED
Opiate, Ur Screen: NOT DETECTED
Phencyclidine (PCP) Ur S: NOT DETECTED
Phencyclidine (PCP) Ur S: NOT DETECTED
Tricyclic, Ur Screen: NOT DETECTED
Tricyclic, Ur Screen: NOT DETECTED

## 2022-05-27 LAB — CBG MONITORING, ED
Glucose-Capillary: 391 mg/dL — ABNORMAL HIGH (ref 70–99)
Glucose-Capillary: 142 mg/dL — ABNORMAL HIGH (ref 70–99)
Glucose-Capillary: 126 mg/dL — ABNORMAL HIGH (ref 70–99)
Glucose-Capillary: 51 mg/dL — ABNORMAL LOW (ref 70–99)
Glucose-Capillary: 78 mg/dL (ref 70–99)
Glucose-Capillary: 237 mg/dL — ABNORMAL HIGH (ref 70–99)

## 2022-05-27 LAB — LACTIC ACID, PLASMA
Lactic Acid, Venous: 1.9 mmol/L (ref 0.5–1.9)
Lactic Acid, Venous: 2.6 mmol/L (ref 0.5–1.9)
Lactic Acid, Venous: 1.6 mmol/L (ref 0.5–1.9)

## 2022-05-27 LAB — BRAIN NATRIURETIC PEPTIDE: B Natriuretic Peptide: 44.5 pg/mL (ref 0.0–100.0)

## 2022-05-27 LAB — HCG, QUANTITATIVE, PREGNANCY: hCG, Beta Chain, Quant, S: <1 m[IU]/mL (ref <5)

## 2022-05-27 LAB — GLUCOSE, CAPILLARY: Glucose-Capillary: 299 mg/dL — ABNORMAL HIGH (ref 70–99)

## 2022-05-27 LAB — SEDIMENTATION RATE: Sed Rate: 20 mm/hr (ref 0–20)

## 2022-05-27 LAB — APTT: aPTT: 24 seconds (ref 24–36)

## 2022-05-27 LAB — PROCALCITONIN: Procalcitonin: 0.27 ng/mL

## 2022-05-27 LAB — BETA-HYDROXYBUTYRIC ACID: Beta-Hydroxybutyric Acid: 0.18 mmol/L (ref 0.05–0.27)

## 2022-05-27 LAB — LIPASE, BLOOD: Lipase: 46 U/L (ref 11–51)

## 2022-05-27 LAB — OSMOLALITY: Osmolality: 303 mOsm/kg — ABNORMAL HIGH (ref 275–295)

## 2022-05-27 LAB — MRSA NEXT GEN BY PCR, NASAL: MRSA by PCR Next Gen: NOT DETECTED

## 2022-05-27 MED ORDER — VANCOMYCIN HCL IN DEXTROSE 1-5 GM/200ML-% IV SOLN
1000.0000 mg | INTRAVENOUS | Status: DC
Start: 2022-05-28 — End: 2022-05-29
  Administered 2022-05-28 – 2022-05-29 (×2): 1000 mg via INTRAVENOUS
  Filled 2022-05-27 (×3): qty 200

## 2022-05-27 MED ORDER — POTASSIUM CHLORIDE IN NACL 20-0.9 MEQ/L-% IV SOLN: INTRAVENOUS | Status: DC

## 2022-05-27 MED ORDER — ACETAMINOPHEN 325 MG PO TABS: 650.0000 mg | ORAL_TABLET | Freq: Four times a day (QID) | ORAL | Status: DC | PRN

## 2022-05-27 MED ORDER — ONDANSETRON HCL 4 MG/2ML IJ SOLN
4.0000 mg | Freq: Three times a day (TID) | INTRAMUSCULAR | Status: DC | PRN
  Administered 2022-05-27 – 2022-06-01 (×9): 4 mg via INTRAVENOUS
  Filled 2022-05-27 (×9): qty 2

## 2022-05-27 MED ORDER — LACTATED RINGERS IV BOLUS: 1000.0000 mL | Freq: Once | INTRAVENOUS | Status: DC

## 2022-05-27 MED ORDER — ZINC SULFATE 220 (50 ZN) MG PO CAPS
220.0000 mg | ORAL_CAPSULE | Freq: Every day | ORAL | Status: DC
  Administered 2022-05-27 – 2022-06-01 (×3): 220 mg via ORAL
  Filled 2022-05-27 (×3): qty 1

## 2022-05-27 MED ORDER — NICOTINE 21 MG/24HR TD PT24
21.0000 mg | MEDICATED_PATCH | Freq: Every day | TRANSDERMAL | Status: DC
  Administered 2022-05-27: 21 mg via TRANSDERMAL
  Filled 2022-05-27 (×2): qty 1

## 2022-05-27 MED ORDER — INSULIN ASPART 100 UNIT/ML IJ SOLN
0.0000 [IU] | Freq: Three times a day (TID) | INTRAMUSCULAR | Status: DC
  Administered 2022-05-27: 3 [IU] via SUBCUTANEOUS
  Administered 2022-05-28: 1 [IU] via SUBCUTANEOUS
  Administered 2022-05-28: 7 [IU] via SUBCUTANEOUS
  Administered 2022-05-28 – 2022-05-29 (×2): 3 [IU] via SUBCUTANEOUS
  Administered 2022-05-29: 7 [IU] via SUBCUTANEOUS
  Administered 2022-05-30: 3 [IU] via SUBCUTANEOUS
  Administered 2022-05-30: 9 [IU] via SUBCUTANEOUS
  Administered 2022-05-31: 5 [IU] via SUBCUTANEOUS
  Administered 2022-05-31: 2 [IU] via SUBCUTANEOUS
  Administered 2022-05-31: 1 [IU] via SUBCUTANEOUS
  Administered 2022-06-01: 3 [IU] via SUBCUTANEOUS
  Filled 2022-05-27 (×12): qty 1

## 2022-05-27 MED ORDER — HYDRALAZINE HCL 20 MG/ML IJ SOLN
5.0000 mg | INTRAMUSCULAR | Status: DC | PRN
Start: 2022-05-27 — End: 2022-06-01

## 2022-05-27 MED ORDER — ACETAMINOPHEN 10 MG/ML IV SOLN
1000.0000 mg | INTRAVENOUS | Status: AC
  Administered 2022-05-27: 1000 mg via INTRAVENOUS
  Filled 2022-05-27: qty 100

## 2022-05-27 MED ORDER — VANCOMYCIN HCL IN DEXTROSE 1-5 GM/200ML-% IV SOLN
1000.0000 mg | Freq: Once | INTRAVENOUS | Status: AC
  Administered 2022-05-27: 1000 mg via INTRAVENOUS
  Filled 2022-05-27: qty 200

## 2022-05-27 MED ORDER — IOHEXOL 300 MG/ML  SOLN
75.0000 mL | Freq: Once | INTRAMUSCULAR | Status: AC | PRN
  Administered 2022-05-27: 75 mL via INTRAVENOUS

## 2022-05-27 MED ORDER — SODIUM CHLORIDE 0.9 % IV SOLN
2.0000 g | INTRAVENOUS | Status: DC
  Administered 2022-05-28 – 2022-06-01 (×5): 2 g via INTRAVENOUS
  Filled 2022-05-27: qty 20
  Filled 2022-05-27 (×2): qty 2
  Filled 2022-05-27: qty 20
  Filled 2022-05-27: qty 2

## 2022-05-27 MED ORDER — INSULIN GLARGINE-YFGN 100 UNIT/ML ~~LOC~~ SOLN
17.0000 [IU] | SUBCUTANEOUS | Status: AC
  Administered 2022-05-27: 17 [IU] via SUBCUTANEOUS
  Filled 2022-05-27: qty 0.17

## 2022-05-27 MED ORDER — SODIUM CHLORIDE 0.9 % IV SOLN
2.0000 g | Freq: Once | INTRAVENOUS | Status: AC
  Administered 2022-05-27: 2 g via INTRAVENOUS
  Filled 2022-05-27: qty 20

## 2022-05-27 MED ORDER — ENSURE MAX PROTEIN PO LIQD
11.0000 [oz_av] | Freq: Every day | ORAL | Status: DC
  Administered 2022-05-27 – 2022-05-31 (×5): 11 [oz_av] via ORAL
  Filled 2022-05-27: qty 330

## 2022-05-27 MED ORDER — POTASSIUM CHLORIDE CRYS ER 20 MEQ PO TBCR: 40.0000 meq | EXTENDED_RELEASE_TABLET | Freq: Once | ORAL | Status: DC

## 2022-05-27 MED ORDER — POTASSIUM CHLORIDE CRYS ER 20 MEQ PO TBCR
40.0000 meq | EXTENDED_RELEASE_TABLET | ORAL | Status: AC
  Administered 2022-05-27 (×2): 40 meq via ORAL
  Filled 2022-05-27 (×2): qty 2

## 2022-05-27 MED ORDER — ASCORBIC ACID 500 MG PO TABS
500.0000 mg | ORAL_TABLET | Freq: Two times a day (BID) | ORAL | Status: DC
  Administered 2022-05-27 – 2022-06-01 (×8): 500 mg via ORAL
  Filled 2022-05-27 (×8): qty 1

## 2022-05-27 MED ORDER — FENTANYL CITRATE PF 50 MCG/ML IJ SOSY
50.0000 ug | PREFILLED_SYRINGE | Freq: Once | INTRAMUSCULAR | Status: AC
  Administered 2022-05-27: 50 ug via INTRAVENOUS
  Filled 2022-05-27: qty 1

## 2022-05-27 MED ORDER — INSULIN ASPART 100 UNIT/ML IJ SOLN
0.0000 [IU] | Freq: Every day | INTRAMUSCULAR | Status: DC
  Administered 2022-05-27 – 2022-05-29 (×3): 3 [IU] via SUBCUTANEOUS
  Administered 2022-05-30: 5 [IU] via SUBCUTANEOUS
  Filled 2022-05-27 (×2): qty 1

## 2022-05-27 MED ORDER — SODIUM CHLORIDE 0.9 % IV SOLN: INTRAVENOUS | Status: DC

## 2022-05-27 MED ORDER — ADULT MULTIVITAMIN W/MINERALS CH
1.0000 | ORAL_TABLET | Freq: Every day | ORAL | Status: DC
  Administered 2022-05-27 – 2022-06-01 (×3): 1 via ORAL
  Filled 2022-05-27 (×3): qty 1

## 2022-05-27 MED ORDER — OXYCODONE-ACETAMINOPHEN 5-325 MG PO TABS
1.0000 | ORAL_TABLET | ORAL | Status: DC | PRN
  Administered 2022-05-27 – 2022-05-28 (×6): 1 via ORAL
  Filled 2022-05-27 (×6): qty 1

## 2022-05-27 MED ORDER — ENSURE ENLIVE PO LIQD
237.0000 mL | Freq: Two times a day (BID) | ORAL | Status: DC
  Administered 2022-05-28: 237 mL via ORAL

## 2022-05-27 MED ORDER — KETOROLAC TROMETHAMINE 15 MG/ML IJ SOLN
15.0000 mg | Freq: Once | INTRAMUSCULAR | Status: AC
  Administered 2022-05-27: 15 mg via INTRAVENOUS
  Filled 2022-05-27: qty 1

## 2022-05-27 MED ORDER — INSULIN ASPART 100 UNIT/ML IJ SOLN
0.0000 [IU] | INTRAMUSCULAR | Status: DC
  Administered 2022-05-27: 15 [IU] via SUBCUTANEOUS
  Filled 2022-05-27: qty 1

## 2022-05-27 MED ORDER — LACTATED RINGERS IV BOLUS
2000.0000 mL | Freq: Once | INTRAVENOUS | Status: AC
  Administered 2022-05-27: 2000 mL via INTRAVENOUS

## 2022-05-27 MED ORDER — INSULIN GLARGINE-YFGN 100 UNIT/ML ~~LOC~~ SOLN
25.0000 [IU] | Freq: Every day | SUBCUTANEOUS | Status: DC
  Administered 2022-05-28 – 2022-06-01 (×5): 25 [IU] via SUBCUTANEOUS
  Filled 2022-05-27 (×5): qty 0.25

## 2022-05-27 MED ORDER — JUVEN PO PACK
1.0000 | PACK | Freq: Two times a day (BID) | ORAL | Status: DC
  Administered 2022-05-28: 1 via ORAL

## 2022-05-27 MED ORDER — ONDANSETRON HCL 4 MG/2ML IJ SOLN
4.0000 mg | Freq: Once | INTRAMUSCULAR | Status: AC
  Administered 2022-05-27: 4 mg via INTRAVENOUS
  Filled 2022-05-27: qty 2

## 2022-05-27 NOTE — H&P (Signed)
History and Physical    [name] WUJ:[id number] DOB: 26-Jul-1986 DOA: 05/27/2022  Referring MD/NP/PA:   [facility]P: [facility]   Patient coming from:  The patient is coming from home.  At baseline, pt is independent for most of ADL.        Chief Complaint: pain and swelling in right buttock area  HPI: [name] is a 36 y.o. female with medical history significant of polysubstance abuse (cocaine, methamphetamine, benzo, tobacco), hypertension, type 1 diabetes, GERD, CHF, CKD, who presents with pain and swelling in the right buttock diarrhea.  States that her symptoms started 3 days ago.  She has swelling and pain in the right buttock area, which is constant, severe, sharp, nonradiating.  No local drainage.  Patient does not have fever or chills.  She also has dysuria, increased urinary frequency, lower abdominal pain and pressure. Has bilateral flank pain.  Patient had nausea and vomiting yesterday which has resolved.  Currently no nausea, vomiting or diarrhea. Denies chest pain, cough, shortness breath.  Data reviewed independently and ED Course: pt was found to have WBC 10.7, positive urinalysis (hazy appearance, moderate amount of leukocyte, rare bacteria, WBC > 50), but sugar 600  --> 138  --> 126, beta hydroxybutyric acid 0.18, lactic acid 2.6, 1.6, INR 0.9, PTT 24, BNP 44.5, positive urinalysis of Caucasian 11 vitamin, lipase 46, negative pregnancy test.  Temperature normal, blood pressure 92/60, heart rate of 106, RR 18, oxygen saturation 97% on room air.  Patient is admitted to telemetry bed as inpatient.  Dr. Aleen Campi of surgery is consulted.  CT scan of abdomen/pelvis: 1. 5.0 x 3.0 x 4.8 cm complex multilocular rim enhancing multi-septated collection subcutaneously at the junction of the posteromedial right buttock and posterior upper thigh, surrounding edema consistent with cellulitis. The edema does not grossly extend into the perineal planes but it is very  close to the perineum. Internal density is above the typical density of fluid therefore this could be a very viscous abscess or a phlegmon forming into an abscess. 2. 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 3. Constipation. 4. Gastroenteritis. 5. Mild hydronephrosis without hydroureter or stone disease. No significant bladder dilatation. 6. Mildly steatotic liver. 7. Aortic atherosclerosis. 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  EKG: I have personally reviewed.  Sinus rhythm, QTc 469, LAE, low voltage in lead I   Review of Systems:   General: no fevers, chills, no body weight gain, has fatigue HEENT: no blurry vision, hearing changes or sore throat Respiratory: no dyspnea, coughing, wheezing CV: no chest pain, no palpitations GI: no nausea, vomiting, has lower abdominal pain, no diarrhea, constipation GU: has dysuria, no burning on urination, has increased urinary frequency, no hematuria.  Ext: no leg edema Neuro: no unilateral weakness, numbness, or tingling, no vision change or hearing loss Skin: has pain, swelling, erythema in right buttock area MSK: No muscle spasm, no deformity, no limitation of range of movement in spin.  Has bilateral flank pain Heme: No easy bruising.  Travel history: No recent long distant travel.   Allergy:  Allergies  Allergen Reactions   Codeine Anaphylaxis and Nausea And Vomiting   Penicillins Rash    As a teenager   Tramadol Itching    Past Medical History:  Diagnosis Date   Acid reflux 2010   CHF (congestive heart failure) (HCC)    Diabetes mellitus    DKA 05/01/2011   Hypertension    Polysubstance abuse (HCC)  Past Surgical History:  Procedure Laterality Date   TUBAL LIGATION     WOUND EXPLORATION Left 07/26/2014   Procedure: Irrigation, debridement and exploration of elbow wounds;  Surgeon: Betha Loa, MD;  Location: [facility] OR;  Service: Orthopedics;  Laterality: Left;     Social History:  reports that she has been smoking cigarettes. She has a 15.00 pack-year smoking history. She has never used smokeless tobacco. She reports that she does not currently use drugs after having used the following drugs: Cocaine. She reports that she does not drink alcohol.  Family History:  Family History  Problem Relation Age of Onset   Coronary artery disease Mother    Lung cancer Mother    Heart attack Father 39   Thyroid disease Maternal Grandmother    Diabetes type I Daughter      Prior to Admission medications   Medication Sig Start Date End Date Taking? Authorizing Provider  carvedilol (COREG) 6.25 MG tablet Take 1 tablet (6.25 mg total) by mouth 2 (two) times daily with a meal. Increased from 3.125 mg. 12/31/20 05/27/22 Yes Darlin Priestly, MD  insulin aspart (NOVOLOG FLEXPEN) 100 UNIT/ML FlexPen Inject 1-15 Units into the skin 3 (three) times daily with meals. Dose based on CBG and carb count, 1 unit for every 15 carb. 11/01/21  Yes Tat, Onalee Hua, MD  insulin glargine (LANTUS SOLOSTAR) 100 UNIT/ML Solostar Pen Inject 17 Units into the skin 2 (two) times daily. Patient taking differently: Inject 35 Units into the skin daily. 11/01/21 10/27/22 Yes Tat, Onalee Hua, MD  lisinopril (ZESTRIL) 5 MG tablet Take 1 tablet (5 mg total) by mouth daily. New medication for your heart. 11/01/21 05/27/22 Yes Tat, Onalee Hua, MD  omeprazole (PRILOSEC) 40 MG capsule Take 40 mg by mouth daily.   Yes [provider]    Physical Exam: Vitals:   05/27/22 2155 05/28/22 0330 05/28/22 1010 05/28/22 1401  BP: 106/75 120/73 95/61 111/71  Pulse: 81 82 77 80  Resp: Temp: 98.2 F (36.8 C) 98.9 F (37.2 C) 98.3 F (36.8 C)   TempSrc:    Oral  SpO2: 100% 98% 99% 100%  Weight:      Height:       General: Not in acute distress HEENT:       Eyes: PERRL, EOMI, no scleral icterus.       ENT: No discharge from the ears and nose, no pharynx injection, no tonsillar enlargement.        Neck:  No JVD, no bruit, no mass felt. Heme: No neck lymph node enlargement. Cardiac: S1/S2, RRR, No murmurs, No gallops or rubs. Respiratory: No rales, wheezing, rhonchi or rubs. GI: Soft, nondistended, has mild tenderness in the lower abdomen.  No rebound pain, no organomegaly, BS present. GU: Has positive CVA tenderness bilaterally Ext: No pitting leg edema bilaterally. 1+DP/PT pulse bilaterally. Musculoskeletal: No joint deformities, No joint redness or warmth, no limitation of ROM in spin. Skin: Has tenderness, swelling, erythema, warm in the right medial gluteal cleft.  No active drainage. Neuro: Alert, oriented X3, cranial nerves II-XII grossly intact, moves all extremities normally. Psych: Patient is not psychotic, no suicidal or hemocidal ideation.  Labs on Admission: I have personally reviewed following labs and imaging studies  CBC: Recent Labs  Lab 05/26/22 1942 05/27/22 0629 05/28/22 0420  WBC 12.9* 10.7* 10.5  HGB 14.7 11.2* 11.7*  HCT 44.9 33.5* 36.0  MCV 85.0 84.4 85.9  PLT 272 187 176   Basic Metabolic  Panel: Recent Labs  Lab 05/26/22 1942 05/27/22 0225 05/27/22 0629 05/28/22 0420  NA 129*  --  134* 134*  K 4.7  --  3.0* 4.4  CL 89*  --  100 103  CO2 29  --  31 27  GLUCOSE 663*  --  138* 241*  BUN 34*  --  19 15  CREATININE 0.98  --  0.53 0.53  CALCIUM 8.9  --  7.8* 7.8*  MG  --  2.2 1.7  --    GFR: Estimated Creatinine Clearance: 68.3 mL/min (by C-G formula based on SCr of 0.53 mg/dL). Liver Function Tests: Recent Labs  Lab 05/27/22 0225  AST 58*  ALT 44  ALKPHOS 118  BILITOT 0.5  PROT 8.0  ALBUMIN 3.8   Recent Labs  Lab 05/27/22 0226  LIPASE 46   No results for input(s): "AMMONIA" in the last 168 hours. Coagulation Profile: Recent Labs  Lab 05/27/22 0225  INR 0.9   Cardiac Enzymes: No results for input(s): "CKTOTAL", "CKMB", "CKMBINDEX", "TROPONINI" in the last 168 hours. BNP (last 3 results) No results for input(s): "PROBNP" in the  last 8760 hours. HbA1C: Recent Labs    05/27/22 0225  HGBA1C 14.9*   CBG: Recent Labs  Lab 05/27/22 1205 05/27/22 1621 05/27/22 2201 05/28/22 0754 05/28/22 1137  GLUCAP 78 237* 299* 219* 148*   Lipid Profile: No results for input(s): "CHOL", "HDL", "LDLCALC", "TRIG", "CHOLHDL", "LDLDIRECT" in the last 72 hours. Thyroid Function Tests: No results for input(s): "TSH", "T4TOTAL", "FREET4", "T3FREE", "THYROIDAB" in the last 72 hours. Anemia Panel: No results for input(s): "VITAMINB12", "FOLATE", "FERRITIN", "TIBC", "IRON", "RETICCTPCT" in the last 72 hours. Urine analysis:    Component Value Date/Time   COLORURINE YELLOW (A) 05/26/2022 1943   APPEARANCEUR HAZY (A) 05/26/2022 1943   LABSPEC 1.027 05/26/2022 1943   PHURINE 7.0 05/26/2022 1943   GLUCOSEU >=500 (A) 05/26/2022 1943   HGBUR LARGE (A) 05/26/2022 1943   BILIRUBINUR NEGATIVE 05/26/2022 1943   KETONESUR NEGATIVE 05/26/2022 1943   PROTEINUR 30 (A) 05/26/2022 1943   UROBILINOGEN 1.0 07/28/2015 1402   NITRITE NEGATIVE 05/26/2022 1943   LEUKOCYTESUR MODERATE (A) 05/26/2022 1943   Sepsis Labs: @LABRCNTIP (procalcitonin:4,lacticidven:4) ) Recent Results (from the past 240 hour(s))  Urine Culture     Status: Abnormal (Preliminary result)   Collection Time: 05/26/22  7:43 PM   Specimen: Urine, Random  Result Value Ref Range Status   Specimen Description   Final    URINE, RANDOM Performed at [facility], [address], [place], [place]    Special Requests   Final    NONE Performed at [facility], [address], [place], [place]    Culture >=100,000 COLONIES/mL ESCHERICHIA COLI (A)  Final   Report Status PENDING  Incomplete  Blood Culture (routine x 2)     Status: None (Preliminary result)   Collection Time: 05/27/22  2:25 AM   Specimen: BLOOD  Result Value Ref Range Status   Specimen Description BLOOD LEFT ASSIST CONTROL  Final   Special Requests   Final    BOTTLES  DRAWN AEROBIC AND ANAEROBIC Blood Culture adequate volume   Culture   Final    NO GROWTH 1 DAY Performed at [facility], [address], [place], [place]    Report Status PENDING  Incomplete  Blood Culture (routine x 2)     Status: None (Preliminary result)   Collection Time: 05/27/22  2:25 AM   Specimen:  BLOOD  Result Value Ref Range Status   Specimen Description BLOOD RIGHT FOREARM  Final   Special Requests   Final    BOTTLES DRAWN AEROBIC AND ANAEROBIC Blood Culture results may not be optimal due to an inadequate volume of blood received in culture bottles   Culture   Final    NO GROWTH 1 DAY Performed at [facility], [address] [place], [place] [address]    Report Status PENDING  Incomplete  MRSA Next Gen by PCR, Nasal     Status: None   Collection Time: 05/27/22  2:25 AM   Specimen: Nasal Mucosa; Nasal Swab  Result Value Ref Range Status   MRSA by PCR Next Gen NOT DETECTED NOT DETECTED Final    Comment: (NOTE) The GeneXpert MRSA Assay (FDA approved for NASAL specimens only), is one component of a comprehensive MRSA colonization surveillance program. It is not intended to diagnose MRSA infection nor to guide or monitor treatment for MRSA infections. Test performance is not FDA approved in patients less than 54 years old. Performed at [facility], [address] [place], [place] [address]      Radiological Exams on Admission: CT ABDOMEN PELVIS W CONTRAST  Result Date: 05/27/2022 CLINICAL DATA:  Right lower quadrant pain and gluteal abscess. EXAM: CT ABDOMEN AND PELVIS WITH CONTRAST TECHNIQUE: Multidetector CT imaging of the abdomen and pelvis was performed using the standard protocol following bolus administration of intravenous contrast. RADIATION DOSE REDUCTION: This exam was performed according to the departmental dose-optimization program which includes automated exposure control, adjustment of the mA and/or kV according to  patient size and/or use of iterative reconstruction technique. CONTRAST:  77mL OMNIPAQUE IOHEXOL 300 MG/ML  SOLN COMPARISON:  Abdomen and pelvis CT 11/28/2020. FINDINGS: Lower chest: Interval resolution of prior right pleural effusion and right lower lobe consolidations. There is mild posterior atelectasis in the lower lobes. Lung bases are otherwise clear. The cardiac size is normal. There is no pericardial effusion. Hepatobiliary: The liver mildly steatotic and measures 17 cm length. The gallbladder is contracted and not well demonstrated but no obvious stones or inflammatory change noted and no biliary dilatation is seen. Pancreas: No focal abnormality. Spleen: No focal abnormality.  No splenomegaly. Adrenals/Urinary Tract: There is no adrenal mass. There is mild bilateral hydronephrosis without evidence of significant ureterectasis, stones or bladder thickening or intravesical stones. Stomach/Bowel: Mild gastric fluid distention is again seen but no outlet obstructing mass. There are thickened folds in the duodenum and jejunum. There is no small bowel obstruction or inflammatory change. The appendix is obscured distal half by overlapping structures. The proximal appendix is unremarkable. Moderate stool retention is seen without evidence of colitis or diverticulitis. Vascular/Lymphatic: Aortic atherosclerosis. No enlarged abdominal or pelvic lymph nodes. 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 Reproductive: The uterus is intact. The ovaries are not enlarged. Left paravaginal cyst near the pelvic floor is again noted, today measuring 2.0 x 1.4 cm on 2:78, previously 1.9 x 1.1 cm. Other: At the junction of the lower medial right buttock and upper posterior thigh, there is a complex multilocular multiseptated rim enhancing collection measuring 5.0 x 3.0 x 4.8 cm with surrounding edema consistent with cellulitis. This is most  consistent with a viscous abscess or phlegmon forming into an abscess. Internal Hounsfield density ranges 40-50 units. There is no incarcerated hernia. There is no free air, free hemorrhage or free fluid. Musculoskeletal: No acute  or significant osseous findings. IMPRESSION: 1. 5.0 x 3.0 x 4.8 cm complex multilocular rim enhancing multi-septated collection subcutaneously at the junction of the posteromedial right buttock and posterior upper thigh, surrounding edema consistent with cellulitis. The edema does not grossly extend into the perineal planes but it is very close to the perineum. Internal density is above the typical density of fluid therefore this could be a very viscous abscess or a phlegmon forming into an abscess. 2. 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 3. Constipation. 4. Gastroenteritis. 5. Mild hydronephrosis without hydroureter or stone disease. No significant bladder dilatation. 6. Mildly steatotic liver. 7. Aortic atherosclerosis. 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 Electronically Signed   By: Almira Bar M.D.   On: 05/27/2022 03:22      Assessment/Plan Principal Problem:   Abscess, gluteal, right Active Problems:   Acute pyelonephritis   Bilateral hydronephrosis   Chronic systolic CHF (congestive heart failure) (HCC)   Type 1 diabetes mellitus with other specified complication (HCC)   HTN (hypertension)   Tobacco abuse   Hypokalemia   Protein-calorie malnutrition, severe (HCC)   Cocaine abuse (HCC)   Gartner's duct cyst   Assessment and Plan: * Abscess, gluteal, right Patient presented with elevated lactic acid 2.6 --> 1.6, heart rate 106, but did not meet criteria for sepsis as no other sepsis criteria present.   --General surgery consulted --Surgical I&D deferred due to cocaine+ UDS -- Repeat UDS tomorrow morning -- Continue empiric vancomycin and Rocephin -- Pain control as needed -- As needed  antiemetics and antipyretics -- Follow cultures   Acute pyelonephritis On empiric IV Rocephin Follow cultures  Bilateral hydronephrosis CT scan negative for kidney stone or obstruction.  Renal function normal --Monitor BMP  Chronic systolic CHF (congestive heart failure) (HCC) 2D echo on 12/03/2020 showed a EF of 30 to 35%, with subsequent echo 10/31/2021 showing recovered EF of 60 to 65%.  No signs of volume overload. -- Monitor volume status  Type 1 diabetes mellitus with other specified complication (HCC) A1c 14.9, poorly controlled.   Presented with blood sugar 600, which improved to 120-130s with subcutaneous insulin.   Takes Lantus 35 unit at home. --Glargine insulin 25 units daily --Sliding scale insulin -- Adjust insulin for inpatient goal 140-180  HTN (hypertension) Hold Coreg, lisinopril due to soft blood pressure. IV hydralazine as needed  Tobacco abuse Patient counseled on importance of smoking cessation -Nicotine patch  Hypokalemia Potassium 3.0 on admission was replaced., Magnesium 1.7 --Monitor BMP, Mg and replace K and mag as needed  Protein-calorie malnutrition, severe (HCC) Body weight 45.4 kg, BMI 19.53 - Nutrition consult -Started Ensure  Cocaine abuse (HCC) Patient counseled about importance of quitting cocaine and other substance use   Gartner's duct cyst Outpatient follow-up with OB/GYN          DVT ppx: SCD  Code Status: Full code  Family Communication: not done, no family member is at bed side.    Disposition Plan:  Anticipate discharge back to previous environment  Consults called: Dr. Aleen Campi of surgery  Admission status and Level of care: Telemetry Medical:    as inpt       Severity of Illness:  The appropriate patient status for this patient is INPATIENT. Inpatient status is judged to be reasonable and necessary in order to provide the required intensity of service to ensure the patient's safety. The patient's  presenting symptoms, physical exam findings, and initial radiographic and laboratory  data in the context of their chronic comorbidities is felt to place them at high risk for further clinical deterioration. Furthermore, it is not anticipated that the patient will be medically stable for discharge from the hospital within 2 midnights of admission.   * I certify that at the point of admission it is my clinical judgment that the patient will require inpatient hospital care spanning beyond 2 midnights from the point of admission due to high intensity of service, high risk for further deterioration and high frequency of surveillance required.*       Date of Service 05/28/2022    Lorretta Harp [facility]   If 7PM-7AM, please contact night-coverage [email or web address] 05/28/2022, 4:46 PM

## 2022-05-27 NOTE — ED Notes (Addendum)
Pt is AOX4, NAD noted. Pt given apple juice and crackers for BGL 51

## 2022-05-27 NOTE — Sepsis Progress Note (Signed)
Monitoring for the code sepsis protocol. °

## 2022-05-27 NOTE — Progress Notes (Signed)
PHARMACY -  BRIEF ANTIBIOTIC NOTE   Pharmacy has received consult(s) for Vancomycin from an ED provider.  The patient's profile has been reviewed for ht/wt/allergies/indication/available labs.    One time order(s) placed for Vancomycin 1 gm per pt wt: 45.4 kg  Further antibiotics/pharmacy consults should be ordered by admitting physician if indicated.                       Thank you, Otelia Sergeant, PharmD, [facility] 05/27/2022 2:11 AM

## 2022-05-27 NOTE — Progress Notes (Signed)
CODE SEPSIS - PHARMACY COMMUNICATION  **Broad Spectrum Antibiotics should be administered within 1 hour of Sepsis diagnosis**  Time Code Sepsis Called/Page Received: 1941  Antibiotics Ordered: Ceftriaxone & Vancomycin  Time of 1st antibiotic administration: 0312  Otelia Sergeant, PharmD, [facility] 05/27/2022 3:57 AM

## 2022-05-27 NOTE — ED Notes (Signed)
Pt given dinner and cup of ice per request

## 2022-05-27 NOTE — Assessment & Plan Note (Signed)
Patient counseled about importance of quitting cocaine and other substance use

## 2022-05-27 NOTE — Assessment & Plan Note (Addendum)
2D echo on 12/03/2020 showed a EF of 30 to 35%.  The repeated 2D echo on 10/31/2021 showed EF of 60 to 65%, significantly improved.  Patient does not have leg edema or JVD.  CHF is compensated.  BNP 44.5. -Watch volume status closely

## 2022-05-27 NOTE — Assessment & Plan Note (Addendum)
Hold Coreg, lisinopril due to soft blood pressure. IV hydralazine as needed

## 2022-05-27 NOTE — Assessment & Plan Note (Addendum)
On empiric IV Rocephin Follow cultures

## 2022-05-27 NOTE — H&P (View-Only) (Signed)
Date of Consultation:  05/27/2022  Requesting Physician:  Zachary Smith, MD  Reason for Consultation:  Right gluteal abscess  History of Present Illness: [name] is a 36 y.o. female presenting with a 2-3 day history of a worsening pain in the right lower buttocks.  The patient reports that it started as something small and has been growing in size and becoming more tender.  She denies any drainage from the area.  Reports history of two prior cysts that have been I&D on her finger and chest.  She has history of uncontrolled diabetes and on ED presentation, her glucose was 663 and A1c 14.9.  She also has history of drug abuse (cocaine) and has had multiple positive drug screens in the past, including last night on presentation.  She says her last cocaine use was either Monday or Tuesday night.  She had a CT scan in the ED which showed a 5 x 3 x 4.8 cm abscess with loculation in the inferior right buttocks.  Her glucose is improved this morning.  Her K is low at 3 but Na is better at 134.  Lactic acid is elevated to 2.6.  Past Medical History: Past Medical History:  Diagnosis Date   Acid reflux 2010   CHF (congestive heart failure) (HCC)    Diabetes mellitus    DKA 05/01/2011   Hypertension    Polysubstance abuse (HCC)      Past Surgical History: Past Surgical History:  Procedure Laterality Date   TUBAL LIGATION     WOUND EXPLORATION Left 07/26/2014   Procedure: Irrigation, debridement and exploration of elbow wounds;  Surgeon: Kevin Kuzma, MD;  Location: [facility] OR;  Service: Orthopedics;  Laterality: Left;    Home Medications: Prior to Admission medications   Medication Sig Start Date End Date Taking? Authorizing Provider  carvedilol (COREG) 6.25 MG tablet Take 1 tablet (6.25 mg total) by mouth 2 (two) times daily with a meal. Increased from 3.125 mg. 12/31/20 05/27/22 Yes Lai, Tina, MD  insulin aspart (NOVOLOG FLEXPEN) 100 UNIT/ML FlexPen Inject 1-15 Units into the skin 3 (three)  times daily with meals. Dose based on CBG and carb count, 1 unit for every 15 carb. 11/01/21  Yes Tat, David, MD  insulin glargine (LANTUS SOLOSTAR) 100 UNIT/ML Solostar Pen Inject 17 Units into the skin 2 (two) times daily. Patient taking differently: Inject 35 Units into the skin daily. 11/01/21 10/27/22 Yes Tat, David, MD  lisinopril (ZESTRIL) 5 MG tablet Take 1 tablet (5 mg total) by mouth daily. New medication for your heart. 11/01/21 05/27/22 Yes Tat, David, MD  omeprazole (PRILOSEC) 40 MG capsule Take 40 mg by mouth daily.   Yes [provider]    Allergies: Allergies  Allergen Reactions   Codeine Anaphylaxis and Nausea And Vomiting   Penicillins Rash    As a teenager   Tramadol Itching    Social History:  reports that she has been smoking cigarettes. She has a 15.00 pack-year smoking history. She has never used smokeless tobacco. She reports that she does not currently use drugs after having used the following drugs: Cocaine. She reports that she does not drink alcohol.   Family History: Family History  Problem Relation Age of Onset   Coronary artery disease Mother    Lung cancer Mother    Heart attack Father 50   Thyroid disease Maternal Grandmother    Diabetes type I Daughter     Review of Systems: Review of Systems  Constitutional:  Positive   for malaise/fatigue. Negative for chills and fever.  HENT:  Negative for hearing loss.   Respiratory:  Negative for cough.   Cardiovascular:  Negative for chest pain.  Gastrointestinal:  Negative for abdominal pain, nausea and vomiting.  Genitourinary:  Negative for dysuria.  Musculoskeletal:  Negative for myalgias.  Skin:  Negative for rash.       Right gluteal pain/swelling  Neurological:  Negative for dizziness.  Psychiatric/Behavioral:  Negative for depression.     Physical Exam BP 103/71 (BP Location: Right Arm)   Pulse 74   Temp 98.9 F (37.2 C) (Oral)   Resp 13   Ht 5' (1.524 m)   Wt 45.4 kg   SpO2 99%    BMI 19.53 kg/m  CONSTITUTIONAL: No acute distress HEENT:  Normocephalic, atraumatic, extraocular motion intact. NECK: Trachea is midline, and there is no jugular venous distension. RESPIRATORY:  Normal respiratory effort without pathologic use of accessory muscles. CARDIOVASCULAR: Regular rhythm and rate. RECTAL:  there is an area of firmness and induration in the lowe right buttocks consistent with the abscess noted on CT scan. There is no significant erythema and there is no fluctuance. MUSCULOSKELETAL:  Normal muscle strength and tone in all four extremities.  No peripheral edema or cyanosis. SKIN: Skin turgor is normal. There are no pathologic skin lesions.  NEUROLOGIC:  Motor and sensation is grossly normal.  Cranial nerves are grossly intact. PSYCH:  Alert and oriented to person, place and time. Affect is normal.  Laboratory Analysis: Results for orders placed or performed during the hospital encounter of 05/27/22 (from the past 24 hour(s))  CBG monitoring, ED     Status: Abnormal   Collection Time: 05/26/22  7:35 PM  Result Value Ref Range   Glucose-Capillary >600 (HH) 70 - 99 mg/dL  Basic metabolic panel     Status: Abnormal   Collection Time: 05/26/22  7:42 PM  Result Value Ref Range   Sodium 129 (L) 135 - 145 mmol/L   Potassium 4.7 3.5 - 5.1 mmol/L   Chloride 89 (L) 98 - 111 mmol/L   CO2 29 22 - 32 mmol/L   Glucose, Bld 663 (HH) 70 - 99 mg/dL   BUN 34 (H) 6 - 20 mg/dL   Creatinine, Ser 0.98 0.44 - 1.00 mg/dL   Calcium 8.9 8.9 - 10.3 mg/dL   GFR, Estimated >60 >60 mL/min   Anion gap 11 5 - 15  CBC     Status: Abnormal   Collection Time: 05/26/22  7:42 PM  Result Value Ref Range   WBC 12.9 (H) 4.0 - 10.5 K/uL   RBC 5.28 (H) 3.87 - 5.11 MIL/uL   Hemoglobin 14.7 12.0 - 15.0 g/dL   HCT 44.9 36.0 - 46.0 %   MCV 85.0 80.0 - 100.0 fL   MCH 27.8 26.0 - 34.0 pg   MCHC 32.7 30.0 - 36.0 g/dL   RDW 14.6 11.5 - 15.5 %   Platelets 272 150 - 400 K/uL   nRBC 0.0 0.0 - 0.2 %   Urinalysis, Routine w reflex microscopic     Status: Abnormal   Collection Time: 05/26/22  7:43 PM  Result Value Ref Range   Color, Urine YELLOW (A) YELLOW   APPearance HAZY (A) CLEAR   Specific Gravity, Urine 1.027 1.005 - 1.030   pH 7.0 5.0 - 8.0   Glucose, UA >=500 (A) NEGATIVE mg/dL   Hgb urine dipstick LARGE (A) NEGATIVE   Bilirubin Urine NEGATIVE NEGATIVE   Ketones, ur NEGATIVE   NEGATIVE mg/dL   Protein, ur 30 (A) NEGATIVE mg/dL   Nitrite NEGATIVE NEGATIVE   Leukocytes,Ua MODERATE (A) NEGATIVE   RBC / HPF 0-5 0 - 5 RBC/hpf   WBC, UA >50 (H) 0 - 5 WBC/hpf   Bacteria, UA RARE (A) NONE SEEN   Squamous Epithelial / LPF 0-5 0 - 5  Pregnancy, urine     Status: None   Collection Time: 05/26/22  7:43 PM  Result Value Ref Range   Preg Test, Ur NEGATIVE NEGATIVE  Urine Drug Screen, Qualitative ([facility] only)     Status: Abnormal   Collection Time: 05/26/22  7:43 PM  Result Value Ref Range   Tricyclic, Ur Screen NONE DETECTED NONE DETECTED   Amphetamines, Ur Screen NONE DETECTED NONE DETECTED   MDMA (Ecstasy)Ur Screen NONE DETECTED NONE DETECTED   Cocaine Metabolite,Ur [place] POSITIVE (A) NONE DETECTED   Opiate, Ur Screen NONE DETECTED NONE DETECTED   Phencyclidine (PCP) Ur S NONE DETECTED NONE DETECTED   Cannabinoid 50 Ng, Ur [place] NONE DETECTED NONE DETECTED   Barbiturates, Ur Screen NONE DETECTED NONE DETECTED   Benzodiazepine, Ur Scrn NONE DETECTED NONE DETECTED   Methadone Scn, Ur NONE DETECTED NONE DETECTED  Lactic acid, plasma     Status: None   Collection Time: 05/27/22  2:25 AM  Result Value Ref Range   Lactic Acid, Venous 1.9 0.5 - 1.9 mmol/L  Protime-INR     Status: None   Collection Time: 05/27/22  2:25 AM  Result Value Ref Range   Prothrombin Time 11.6 11.4 - 15.2 seconds   INR 0.9 0.8 - 1.2  APTT     Status: None   Collection Time: 05/27/22  2:25 AM  Result Value Ref Range   aPTT 24 24 - 36 seconds  Blood Culture (routine x 2)     Status: None (Preliminary result)    Collection Time: 05/27/22  2:25 AM   Specimen: BLOOD  Result Value Ref Range   Specimen Description BLOOD LEFT ASSIST CONTROL    Special Requests      BOTTLES DRAWN AEROBIC AND ANAEROBIC Blood Culture adequate volume   Culture      NO GROWTH < 12 HOURS Performed at [facility], [address] [place], [place] [address]    Report Status PENDING   Blood Culture (routine x 2)     Status: None (Preliminary result)   Collection Time: 05/27/22  2:25 AM   Specimen: BLOOD  Result Value Ref Range   Specimen Description BLOOD RIGHT FOREARM    Special Requests      BOTTLES DRAWN AEROBIC AND ANAEROBIC Blood Culture results may not be optimal due to an inadequate volume of blood received in culture bottles   Culture      NO GROWTH < 12 HOURS Performed at [facility], [address] [place], [place] [address]    Report Status PENDING   Osmolality     Status: Abnormal   Collection Time: 05/27/22  2:25 AM  Result Value Ref Range   Osmolality 303 (H) 275 - 295 mOsm/kg  Beta-hydroxybutyric acid     Status: None   Collection Time: 05/27/22  2:25 AM  Result Value Ref Range   Beta-Hydroxybutyric Acid 0.18 0.05 - 0.27 mmol/L  Blood gas, venous     Status: Abnormal   Collection Time: 05/27/22  2:25 AM  Result Value Ref Range   pH, Ven 7.42 7.25 - 7.43   pCO2, Ven 48 44 - 60  mmHg   pO2, Ven <31 (LL) 32 - 45 mmHg   Bicarbonate 31.1 (H) 20.0 - 28.0 mmol/L   Acid-Base Excess 5.5 (H) 0.0 - 2.0 mmol/L   O2 Saturation 43.3 %   Patient temperature 37.0    Collection site VEIN   Hepatic function panel     Status: Abnormal   Collection Time: 05/27/22  2:25 AM  Result Value Ref Range   Total Protein 8.0 6.5 - 8.1 g/dL   Albumin 3.8 3.5 - 5.0 g/dL   AST 58 (H) 15 - 41 U/L   ALT 44 0 - 44 U/L   Alkaline Phosphatase 118 38 - 126 U/L   Total Bilirubin 0.5 0.3 - 1.2 mg/dL   Bilirubin, Direct <0.1 0.0 - 0.2 mg/dL   Indirect Bilirubin NOT CALCULATED 0.3 - 0.9 mg/dL  Hemoglobin A1c      Status: Abnormal   Collection Time: 05/27/22  2:25 AM  Result Value Ref Range   Hgb A1c MFr Bld 14.9 (H) 4.8 - 5.6 %   Mean Plasma Glucose 380.93 mg/dL  Magnesium     Status: None   Collection Time: 05/27/22  2:25 AM  Result Value Ref Range   Magnesium 2.2 1.7 - 2.4 mg/dL  Procalcitonin - Baseline     Status: None   Collection Time: 05/27/22  2:25 AM  Result Value Ref Range   Procalcitonin 0.27 ng/mL  MRSA Next Gen by PCR, Nasal     Status: None   Collection Time: 05/27/22  2:25 AM   Specimen: Nasal Mucosa; Nasal Swab  Result Value Ref Range   MRSA by PCR Next Gen NOT DETECTED NOT DETECTED  hCG, quantitative, pregnancy     Status: None   Collection Time: 05/27/22  2:25 AM  Result Value Ref Range   hCG, Beta Chain, Quant, S <1 <5 mIU/mL  Lipase, blood     Status: None   Collection Time: 05/27/22  2:26 AM  Result Value Ref Range   Lipase 46 11 - 51 U/L  CBG monitoring, ED     Status: Abnormal   Collection Time: 05/27/22  3:16 AM  Result Value Ref Range   Glucose-Capillary 391 (H) 70 - 99 mg/dL  Lactic acid, plasma     Status: Abnormal   Collection Time: 05/27/22  4:42 AM  Result Value Ref Range   Lactic Acid, Venous 2.6 (HH) 0.5 - 1.9 mmol/L  CBC     Status: Abnormal   Collection Time: 05/27/22  6:29 AM  Result Value Ref Range   WBC 10.7 (H) 4.0 - 10.5 K/uL   RBC 3.97 3.87 - 5.11 MIL/uL   Hemoglobin 11.2 (L) 12.0 - 15.0 g/dL   HCT 33.5 (L) 36.0 - 46.0 %   MCV 84.4 80.0 - 100.0 fL   MCH 28.2 26.0 - 34.0 pg   MCHC 33.4 30.0 - 36.0 g/dL   RDW 14.4 11.5 - 15.5 %   Platelets 187 150 - 400 K/uL   nRBC 0.0 0.0 - 0.2 %  Basic metabolic panel     Status: Abnormal   Collection Time: 05/27/22  6:29 AM  Result Value Ref Range   Sodium 134 (L) 135 - 145 mmol/L   Potassium 3.0 (L) 3.5 - 5.1 mmol/L   Chloride 100 98 - 111 mmol/L   CO2 31 22 - 32 mmol/L   Glucose, Bld 138 (H) 70 - 99 mg/dL   BUN 19 6 - 20 mg/dL   Creatinine, Ser 0.53 0.44 -   1.00 mg/dL   Calcium 7.8 (L) 8.9  - 10.3 mg/dL   GFR, Estimated >89 >37 mL/min   Anion gap 3 (L) 5 - 15  Magnesium     Status: None   Collection Time: 05/27/22  6:29 AM  Result Value Ref Range   Magnesium 1.7 1.7 - 2.4 mg/dL  CBG monitoring, ED     Status: Abnormal   Collection Time: 05/27/22  6:30 AM  Result Value Ref Range   Glucose-Capillary 142 (H) 70 - 99 mg/dL    Imaging: CT ABDOMEN PELVIS W CONTRAST  Result Date: 05/27/2022 CLINICAL DATA:  Right lower quadrant pain and gluteal abscess. EXAM: CT ABDOMEN AND PELVIS WITH CONTRAST TECHNIQUE: Multidetector CT imaging of the abdomen and pelvis was performed using the standard protocol following bolus administration of intravenous contrast. RADIATION DOSE REDUCTION: This exam was performed according to the departmental dose-optimization program which includes automated exposure control, adjustment of the mA and/or kV according to patient size and/or use of iterative reconstruction technique. CONTRAST:  74mL OMNIPAQUE IOHEXOL 300 MG/ML  SOLN COMPARISON:  Abdomen and pelvis CT 11/28/2020. FINDINGS: Lower chest: Interval resolution of prior right pleural effusion and right lower lobe consolidations. There is mild posterior atelectasis in the lower lobes. Lung bases are otherwise clear. The cardiac size is normal. There is no pericardial effusion. Hepatobiliary: The liver mildly steatotic and measures 17 cm length. The gallbladder is contracted and not well demonstrated but no obvious stones or inflammatory change noted and no biliary dilatation is seen. Pancreas: No focal abnormality. Spleen: No focal abnormality.  No splenomegaly. Adrenals/Urinary Tract: There is no adrenal mass. There is mild bilateral hydronephrosis without evidence of significant ureterectasis, stones or bladder thickening or intravesical stones. Stomach/Bowel: Mild gastric fluid distention is again seen but no outlet obstructing mass. There are thickened folds in the duodenum and jejunum. There is no small bowel  obstruction or inflammatory change. The appendix is obscured distal half by overlapping structures. The proximal appendix is unremarkable. Moderate stool retention is seen without evidence of colitis or diverticulitis. Vascular/Lymphatic: Aortic atherosclerosis. No enlarged abdominal or pelvic lymph nodes. 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 Reproductive: The uterus is intact. The ovaries are not enlarged. Left paravaginal cyst near the pelvic floor is again noted, today measuring 2.0 x 1.4 cm on 2:78, previously 1.9 x 1.1 cm. Other: At the junction of the lower medial right buttock and upper posterior thigh, there is a complex multilocular multiseptated rim enhancing collection measuring 5.0 x 3.0 x 4.8 cm with surrounding edema consistent with cellulitis. This is most consistent with a viscous abscess or phlegmon forming into an abscess. Internal Hounsfield density ranges 40-50 units. There is no incarcerated hernia. There is no free air, free hemorrhage or free fluid. Musculoskeletal: No acute or significant osseous findings. IMPRESSION: 1. 5.0 x 3.0 x 4.8 cm complex multilocular rim enhancing multi-septated collection subcutaneously at the junction of the posteromedial right buttock and posterior upper thigh, surrounding edema consistent with cellulitis. The edema does not grossly extend into the perineal planes but it is very close to the perineum. Internal density is above the typical density of fluid therefore this could be a very viscous abscess or a phlegmon forming into an abscess. 2. 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 3. Constipation. 4. Gastroenteritis. 5. Mild hydronephrosis without hydroureter or stone disease. No significant bladder dilatation. 6. Mildly steatotic liver.  7. Aortic atherosclerosis. 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 Electronically  Signed   By: Keith  Chesser M.D.   On: 05/27/2022 03:22    Assessment and Plan: This is a 36 y.o. female with right gluteal abscess  --Patient presented with very elevated glucose and although not technically in DKA, close to it.  Her glucose is better and some electrolytes improving, but her lactic acid is a bit elevated to 2.6.  I have started NS+K fluids to hydrate her and will give po Kcl repletion as well.   --Patient reports her cocaine use was either Monday or Tuesday.  Discussed with her that typically it stays in the system for 3 days, so I think it would be prudent to repeat the UDS again today to see if it's cleared.  If so, then we can proceed to OR today for I&D of the abscess.  Would keep NPO for now as a precaution.  If test is positive, then would not be able to take her to OR and can give her a diet.  Tomorrow we can reassess with another test to see if she can get I&D done. --Discussed the procedure at length including risks of bleeding, injury to surrounding structures, the possibility of having to go back for further procedures, the possibility of penrose drains rather than packing of the wound, and she's willing to proceed.  If unable to do surgery today, will discuss with Dr. Rodenberg who is the surgeon on call for this weekend.  I spent 60 minutes dedicated to the care of this patient on the date of this encounter to include pre-visit review of records, face-to-face time with the patient discussing diagnosis and management, and any post-visit coordination of care.   Ronniesha Seibold Luis Shariyah Eland, MD [facility] Pg:  [phone]   

## 2022-05-27 NOTE — ED Notes (Signed)
Patient transported to CT 

## 2022-05-27 NOTE — Assessment & Plan Note (Addendum)
Patient presented with elevated lactic acid 2.6 --> 1.6, heart rate 106, but did not meet criteria for sepsis as no other sepsis criteria present.   8/14: UDS negative, surgical I&D done 8/15: abscess Cx growing rare GPC's --General surgery consulted -- Follow cultures for ID & sensitivities -- Continue empiric vancomycin and Rocephin -- Pain control as needed -- As needed antiemetics and antipyretics

## 2022-05-27 NOTE — Assessment & Plan Note (Signed)
A1c 14.9, poorly controlled.  Initially patient had blood sugar 600, which improved to 120-130s.  Patient is taking Lantus 35 unit at home -Glargine insulin 25 units daily -Sliding scale insulin

## 2022-05-27 NOTE — ED Notes (Signed)
Pt assisted ordering lunch at this time.

## 2022-05-27 NOTE — Progress Notes (Signed)
Pharmacy Antibiotic Note  [name] is a 36 y.o. female admitted on 05/27/2022 with  gluteal abscess .  Pharmacy has been consulted for Vancomycin dosing. Patient will have I&D performed.  Plan: Vancomycin 1000 mg IV Q 24 hrs. Goal AUC 400-550. Expected AUC: 500.5 SCr used: 0.8 Expected Cmin 9.7   Height: 5' (152.4 cm) Weight: 45.4 kg (100 lb) IBW/kg (Calculated) : 45.5  Temp (24hrs), Avg:98.9 F (37.2 C), Min:98.9 F (37.2 C), Max:98.9 F (37.2 C)  Recent Labs  Lab 05/26/22 1942 05/27/22 0225 05/27/22 0442 05/27/22 0629 05/27/22 0822  WBC 12.9*  --   --  10.7*  --   CREATININE 0.98  --   --  0.53  --   LATICACIDVEN  --  1.9 2.6*  --  1.6    Estimated Creatinine Clearance: 68.3 mL/min (by C-G formula based on SCr of 0.53 mg/dL).    Allergies  Allergen Reactions   Codeine Anaphylaxis and Nausea And Vomiting   Penicillins Rash    As a teenager   Tramadol Itching    Antimicrobials this admission: Ceftriaxone 8/11 >>  Vancomcyin 8/11 >>   Dose adjustments this admission:  Microbiology results:  Thank you for allowing pharmacy to be a part of this patient's care.  Clovia Cuff, PharmD, BCPS 05/27/2022 10:13 AM

## 2022-05-27 NOTE — Assessment & Plan Note (Addendum)
Potassium 3.0, magnesium 1.7 -Repleted potassium

## 2022-05-27 NOTE — Assessment & Plan Note (Signed)
Outpatient follow-up with OB/GYN

## 2022-05-27 NOTE — Inpatient Diabetes Management (Addendum)
Inpatient Diabetes Program Recommendations  AACE/ADA: New Consensus Statement on Inpatient Glycemic Control (2015)  Target Ranges:  Prepandial:   less than 140 mg/dL      Peak postprandial:   less than 180 mg/dL (1-2 hours)      Critically ill patients:  140 - 180 mg/dL    Latest Reference Range & Units 05/26/22 19:42  Sodium 135 - 145 mmol/L 129 (L)  Potassium 3.5 - 5.1 mmol/L 4.7  Chloride 98 - 111 mmol/L 89 (L)  CO2 22 - 32 mmol/L 29  Glucose 70 - 99 mg/dL 663 (HH)  BUN 6 - 20 mg/dL 34 (H)  Creatinine 0.44 - 1.00 mg/dL 0.98  Calcium 8.9 - 10.3 mg/dL 8.9  Anion gap 5 - 15  11    Latest Reference Range & Units 05/26/22 19:43  Cocaine Metabolite,Ur [place]  POSITIVE !  !: Data is abnormal  Latest Reference Range & Units 10/30/21 16:45 05/27/22 02:25  Hemoglobin A1C 4.8 - 5.6 % 9.0 (H) 14.9 (H)  380 mg/dl    Latest Reference Range & Units 05/26/22 19:35 05/27/22 03:16 05/27/22 06:30  Glucose-Capillary 70 - 99 mg/dL >600 (HH) 391 (H)  15 units Novolog  17 units Semglee 142 (H)    Admit with: Hyperglycemia and Abscess right buttocks  History: Type 1 Diabetes (makes NO insulin; requires basal, correction, and meal coverage insulin)  Home DM Meds: Novolog 1 unit for every 15 grams Carbohydrates       Novolog for Correction       Lantus 35 units Daily  Current Orders: Novolog Moderate Correction Scale/ SSI (0-15 units) Q4 hours    Note patient was counseled by the Diabetes RN in January 2023  A1c markedly worse since January--Note Tox Screen + for Cocaine--it will be hard for pt to properly manage her Type 1 diabetes with continued Cocaine use at home  PCP: [facility]  MD- Pt received 17 units Semglee down in the ED at 3am.  No orders for [facility] daily.  Please consider placing orders for Semglee 25 units QHS (75% total home dose to start)   Addendum 1130am: Met w/ pt down in the ED.  Pt verified with me home insulin doses--Is supposed to take Lantus 35  units Daily + Novolog 1 unit for every 15 grams Carbohydrates + Novolog 1 unit for every 50 mg/dl above Target CBG of 100 mg/dl.  Admits to missing many doses of insulin.  Still current at [facility] and gets insulins filled there.  I had a frank discussion with pt about the importance of stopping her cocaine use so she can better manage her diabetes.  We discussed how poor glucose control likely caused her current abscess and how she will need very good CBG control to prevent further infections and other long-term complications.  Pt appeared very thin--we talked about how properly taking her insulin may help her gain some weight and how better CBG control will make her feel overall better.  Reviewed her Current A1c of 14.9%--Pt knows this A1c is nowhere near goal.  Has insulin and CBG meter at home.  Strongly encouraged pt to seek Rehab from the drug issues and take her insulin as prescribed.    --Will follow patient during hospitalization--  Wyn Quaker RN, MSN, [place] Diabetes Coordinator Inpatient Glycemic Control Team Team Pager: [phone] (8a-5p)

## 2022-05-27 NOTE — Assessment & Plan Note (Signed)
Patient counseled on importance of smoking cessation -Nicotine patch

## 2022-05-27 NOTE — ED Provider Notes (Signed)
[facility] Provider Note    Event Date/Time   First MD Initiated Contact with Patient 05/27/22 0151     (approximate)   History   Hyperglycemia and Abscess   HPI  [name] is a 36 y.o. female with a past medical history of CHF, HTN and DM as well as recurrent infections who presents for evaluation of 3 days of pain and swelling in the right medial gluteal cleft concerning for an abscess per patient.  He is also having some right lower quadrant abdominal pain as well as nausea and vomiting.  She states she was scared to take her insulin because she been vomiting.  She has no cough, fever, shortness of breath or other acute skin pain.  She denies any illicit drug use or EtOH use but does endorse tobacco abuse.  No analgesia prior to arrival.  No diarrhea.  She denies any urinary symptoms.      Physical Exam  Triage Vital Signs: ED Triage Vitals  Enc Vitals Group     BP 05/26/22 1934 (!) 123/90     Pulse Rate 05/26/22 1934 (!) 106     Resp 05/26/22 1934 17     Temp 05/26/22 1934 98.9 F (37.2 C)     Temp Source 05/26/22 1934 Oral     SpO2 05/26/22 1934 97 %     Weight 05/26/22 1937 100 lb (45.4 kg)     Height 05/26/22 1937 5' (1.524 m)     Head Circumference --      Peak Flow --      Pain Score 05/26/22 1935 8     Pain Loc --      Pain Edu? --      Excl. in GC? --     Most recent vital signs: Vitals:   05/27/22 0339 05/27/22 0400  BP:  133/85  Pulse:  77  Resp:  18  Temp: 98.9 F (37.2 C)   SpO2:  98%    General: Awake, ill-appearing and uncomfortable. CV:  Prolonged capillary refill in the digits.  2+ tachycardic pulses. Resp:  Normal effort.  Clear bilaterally Abd:  No distention.  Some mild tenderness in the right lower quadrant.  Otherwise soft throughout. Other:  Fairly large at least 3 x 2 cm fluctuant tender Subcutaneous palpable abscess in the right gluteal cleft.  No significant surrounding skin changes.   ED  Results / Procedures / Treatments  Labs (all labs ordered are listed, but only abnormal results are displayed) Labs Reviewed  BASIC METABOLIC PANEL - Abnormal; Notable for the following components:      Result Value   Sodium 129 (*)    Chloride 89 (*)    Glucose, Bld 663 (*)    BUN 34 (*)    All other components within normal limits  CBC - Abnormal; Notable for the following components:   WBC 12.9 (*)    RBC 5.28 (*)    All other components within normal limits  URINALYSIS, ROUTINE W REFLEX MICROSCOPIC - Abnormal; Notable for the following components:   Color, Urine YELLOW (*)    APPearance HAZY (*)    Glucose, UA >=500 (*)    Hgb urine dipstick LARGE (*)    Protein, ur 30 (*)    Leukocytes,Ua MODERATE (*)    WBC, UA >50 (*)    Bacteria, UA RARE (*)    All other components within normal limits  BLOOD GAS, VENOUS - Abnormal; Notable for the  following components:   pO2, Ven <31 (*)    Bicarbonate 31.1 (*)    Acid-Base Excess 5.5 (*)    All other components within normal limits  HEPATIC FUNCTION PANEL - Abnormal; Notable for the following components:   AST 58 (*)    All other components within normal limits  URINE DRUG SCREEN, QUALITATIVE ([facility] ONLY) - Abnormal; Notable for the following components:   Cocaine Metabolite,Ur [place] POSITIVE (*)    All other components within normal limits  CBG MONITORING, ED - Abnormal; Notable for the following components:   Glucose-Capillary >600 (*)    All other components within normal limits  CBG MONITORING, ED - Abnormal; Notable for the following components:   Glucose-Capillary 391 (*)    All other components within normal limits  CULTURE, BLOOD (ROUTINE X 2)  CULTURE, BLOOD (ROUTINE X 2)  URINE CULTURE  MRSA NEXT GEN BY PCR, NASAL  LACTIC ACID, PLASMA  PROTIME-INR  APTT  PREGNANCY, URINE  BETA-HYDROXYBUTYRIC ACID  MAGNESIUM  HCG, QUANTITATIVE, PREGNANCY  LACTIC ACID, PLASMA  OSMOLALITY  HEMOGLOBIN A1C  PROCALCITONIN  LIPASE,  BLOOD  CBG MONITORING, ED  POC URINE PREG, ED  POC URINE PREG, ED     EKG  EKG is markable sinus rhythm with ventricular rate of 84, normal axis, unremarkable intervals without clear evidence of acute ischemia or significant arrhythmia.   RADIOLOGY  CT abdomen pelvis on my interpretation does show an abscess at the right buttocks.  I reviewed radiology interpretation and agree to findings of a 5.3 x 4.8 cm complex multiloculated collection concerning for abscess with surrounding cellulitis.  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   PROCEDURES:  Critical Care performed: Yes, see critical care procedure note(s)  .Critical Care  Performed by: Gilles Chiquito, MD Authorized by: Gilles Chiquito, MD   Critical care provider statement:    Critical care time (minutes):  30   Critical care was necessary to treat or prevent imminent or life-threatening deterioration of the following conditions:  Sepsis, endocrine crisis and dehydration   Critical care was time spent personally by me on the following activities:  Development of treatment plan with patient or surrogate, discussions with consultants, evaluation of patient's response to treatment, examination of patient, ordering and review of laboratory studies, ordering and review of radiographic studies, ordering and performing treatments and interventions, pulse oximetry, re-evaluation of patient's condition and review of old charts     MEDICATIONS ORDERED IN ED: Medications  vancomycin (VANCOCIN) IVPB 1000 mg/200 mL premix (has no administration in time range)  insulin aspart (novoLOG) injection 0-15 Units (15 Units Subcutaneous Given  05/27/22 0322)  fentaNYL (SUBLIMAZE) injection 50 mcg (50 mcg Intravenous Given 05/27/22 0302)  cefTRIAXone (ROCEPHIN) 2 g in sodium chloride 0.9 % 100 mL IVPB (0 g Intravenous Stopped 05/27/22 0344)  insulin glargine-yfgn (SEMGLEE) injection 17 Units (17 Units Subcutaneous Given 05/27/22 0317)  lactated ringers bolus 2,000 mL (2,000 mLs Intravenous New Bag/Given 05/27/22 0240)  iohexol (OMNIPAQUE) 300 MG/ML solution 75 mL (75 mLs Intravenous Contrast Given 05/27/22 0249)  ondansetron (ZOFRAN) injection 4 mg (4 mg Intravenous Given 05/27/22 0323)  ketorolac (TORADOL) 15 MG/ML injection 15 mg (15 mg Intravenous Given 05/27/22 0322)  IMPRESSION / MDM / ASSESSMENT AND PLAN / ED COURSE  I reviewed the triage vital signs and the nursing notes. Patient's presentation is most consistent with acute presentation with potential threat to life or bodily function.                               Differential diagnosis includes, but is not limited to apparent abscess with concern for possible sepsis, DKA as well as severe electrolyte or metabolic derangements.  In addition she has some tenderness and pain in the right lower quadrant nausea and vomiting and concern for possible diverticulitis, appendicitis or cystitis.  EKG is markable sinus rhythm with ventricular rate of 84, normal axis, unremarkable intervals without clear evidence of acute ischemia or significant arrhythmia.  BMP is remarkable for sodium of 129, chloride of 89, glucose 663 with anion gap of 11.  CBC with WBC count of 12.9 with hemoglobin and platelets are normal.  Pregnancy test is negative.  UA is concerning for cystitis with moderate leukocyte esterase and greater than 50 WBCs with some bacteria noted.  Pregnancy test is negative.  UA is positive for cocaine but otherwise negative.  Lactic acid 1.9.  Beta-hydroxybutyrate 0.18.  VBG with a pH of 7.42 with pCO2 of 48 and bicarb of 31.1.  Overall clinical picture is not suggestive of DKA.   Hepatic function panel unremarkable.  CT abdomen pelvis on my interpretation does show an abscess at the right buttocks.  I reviewed radiology interpretation and agree to findings of a 5.3 x 4.8 cm complex multiloculated collection concerning for abscess with surrounding cellulitis.  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  Discussed patient presentation workup with on-call surgeon Dr. Aleen Campi will see patient tomorrow but notes that given she is cocaine positive UDS she may not be able to get anesthesia.  We will treat for sepsis with broad-spectrum antibiotics.  I will admit to medicine service for further evaluation and management.      FINAL CLINICAL IMPRESSION(S) / ED DIAGNOSES   Final diagnoses:  Hyperglycemia  Abscess  Sepsis, due to unspecified organism, unspecified whether acute organ dysfunction present [facility])  Acute cystitis without hematuria  Gastroenteritis     Rx / DC Orders   ED Discharge Orders     None        Note:  This document was prepared using Dragon voice recognition software and may include unintentional dictation errors.   Gilles Chiquito, MD 05/27/22 [phone]

## 2022-05-27 NOTE — Progress Notes (Signed)
Initial Nutrition Assessment  DOCUMENTATION CODES:   Not applicable  INTERVENTION:   -1 packet Juven BID, each packet provides 95 calories, 2.5 grams of protein (collagen), and 9.8 grams of carbohydrate (3 grams sugar); also contains 7 grams of L-arginine and L-glutamine, 300 mg vitamin C, 15 mg vitamin E, 1.2 mcg vitamin B-12, 9.5 mg zinc, 200 mg calcium, and 1.5 g  Calcium Beta-hydroxy-Beta-methylbutyrate to support wound healing  -Ensure Max po daily, each supplement provides 150 kcal and 30 grams of protein -MVI with minerals daily -500 mg vitamin C BID -220 mg zinc sulfate daily x 14 days  NUTRITION DIAGNOSIS:   Increased nutrient needs related to post-op healing as evidenced by estimated needs.  GOAL:   Patient will meet greater than or equal to 90% of their needs  MONITOR:   PO intake, Supplement acceptance  REASON FOR ASSESSMENT:   Consult Assessment of nutrition requirement/status  ASSESSMENT:   Pt with past medical history of CHF, HTN and DM as well as recurrent infections who presents for evaluation of 3 days of pain and swelling in the right medial gluteal cleft concerning for an abscess.  Pt admitted with rt gluteal abscess.   Reviewed I/O's: +1.4 L x 24 hours   Per general surgery notes, plan for I&D over the weekend. Pt just advanced to heart healthy, carb modified diet.   Received wt hx; pt has experienced a 4.6% wt loss over the past 6 months, which is not significant for time frame.   Per DM coordinator notes, UDS positive for cocaine.   Pt with increased nutritional needs for post-op healing and would benefit from addition of oral nutrition supplements.   Medications reviewed include potassium chloride and 0.9% sodium chloride infusion @ 100 ml/hr.   Lab Results  Component Value Date   HGBA1C 14.9 (H) 05/27/2022   PTA DM medications are 35 units lantus daily, correction novolog, and 1 unit novolog for every 15 grams of carbohydrates.   Labs  reviewed: Na: 134, K: 3.0, CBGS: 126-142 (inpatient orders for glycemic control are 0-5 units insulin aspart daily at bedtime, 0-9 units insulin aspart TID with meals, and 25 units insulin glargine-yfgn daily).    Diet Order:   Diet Order             Diet heart healthy/carb modified Room service appropriate? Yes; Fluid consistency: Thin  Diet effective now                   EDUCATION NEEDS:   No education needs have been identified at this time  Skin:  Skin Assessment: Reviewed RN Assessment  Last BM:  Unknown  Height:   Ht Readings from Last 1 Encounters:  05/26/22 5' (1.524 m)    Weight:   Wt Readings from Last 1 Encounters:  05/26/22 45.4 kg    Ideal Body Weight:  45.5 kg  BMI:  Body mass index is 19.53 kg/m.  Estimated Nutritional Needs:   Kcal:  1400-1600  Protein:  75-90 grams  Fluid:  > 1.4 L    Levada Schilling, RD, LDN, CDCES Registered Dietitian II Certified Diabetes Care and Education Specialist Please refer to [facility] for RD and/or RD on-call/weekend/after hours pager

## 2022-05-27 NOTE — Plan of Care (Signed)
  Problem: Coping: Goal: Ability to adjust to condition or change in health will improve Outcome: Progressing   Problem: Fluid Volume: Goal: Ability to maintain a balanced intake and output will improve Outcome: Progressing   Problem: Health Behavior/Discharge Planning: Goal: Ability to identify and utilize available resources and services will improve Outcome: Progressing   Problem: Health Behavior/Discharge Planning: Goal: Ability to manage health-related needs will improve Outcome: Progressing   Problem: Nutritional: Goal: Maintenance of adequate nutrition will improve Outcome: Progressing   Problem: Tissue Perfusion: Goal: Adequacy of tissue perfusion will improve Outcome: Progressing   Problem: Nutrition: Goal: Adequate nutrition will be maintained Outcome: Progressing   Problem: Coping: Goal: Level of anxiety will decrease Outcome: Progressing   Problem: Pain Managment: Goal: General experience of comfort will improve Outcome: Progressing

## 2022-05-27 NOTE — Assessment & Plan Note (Signed)
Body weight 45.4 kg, BMI 19.53 - Nutrition consult -Started Ensure

## 2022-05-27 NOTE — Consult Note (Signed)
Date of Consultation:  05/27/2022  Requesting Physician:  Hulan Saas, MD  Reason for Consultation:  Right gluteal abscess  History of Present Illness: [name] is a 36 y.o. female presenting with a 2-3 day history of a worsening pain in the right lower buttocks.  The patient reports that it started as something small and has been growing in size and becoming more tender.  She denies any drainage from the area.  Reports history of two prior cysts that have been I&D on her finger and chest.  She has history of uncontrolled diabetes and on ED presentation, her glucose was 663 and A1c 14.9.  She also has history of drug abuse (cocaine) and has had multiple positive drug screens in the past, including last night on presentation.  She says her last cocaine use was either Monday or Tuesday night.  She had a CT scan in the ED which showed a 5 x 3 x 4.8 cm abscess with loculation in the inferior right buttocks.  Her glucose is improved this morning.  Her K is low at 3 but Na is better at 134.  Lactic acid is elevated to 2.6.  Past Medical History: Past Medical History:  Diagnosis Date   Acid reflux 2010   CHF (congestive heart failure) ([place])    Diabetes mellitus    DKA 05/01/2011   Hypertension    Polysubstance abuse [facility])      Past Surgical History: Past Surgical History:  Procedure Laterality Date   TUBAL LIGATION     WOUND EXPLORATION Left 07/26/2014   Procedure: Irrigation, debridement and exploration of elbow wounds;  Surgeon: Leanora Cover, MD;  Location: [facility];  Service: Orthopedics;  Laterality: Left;    Home Medications: Prior to Admission medications   Medication Sig Start Date End Date Taking? Authorizing Provider  carvedilol (COREG) 6.25 MG tablet Take 1 tablet (6.25 mg total) by mouth 2 (two) times daily with a meal. Increased from 3.125 mg. 12/31/20 05/27/22 Yes Enzo Bi, MD  insulin aspart (NOVOLOG FLEXPEN) 100 UNIT/ML FlexPen Inject 1-15 Units into the skin 3 (three)  times daily with meals. Dose based on CBG and carb count, 1 unit for every 15 carb. 11/01/21  Yes Tat, Shanon Brow, MD  insulin glargine (LANTUS SOLOSTAR) 100 UNIT/ML Solostar Pen Inject 17 Units into the skin 2 (two) times daily. Patient taking differently: Inject 35 Units into the skin daily. 11/01/21 10/27/22 Yes Tat, Shanon Brow, MD  lisinopril (ZESTRIL) 5 MG tablet Take 1 tablet (5 mg total) by mouth daily. New medication for your heart. 11/01/21 05/27/22 Yes Tat, Shanon Brow, MD  omeprazole (PRILOSEC) 40 MG capsule Take 40 mg by mouth daily.   Yes [provider]    Allergies: Allergies  Allergen Reactions   Codeine Anaphylaxis and Nausea And Vomiting   Penicillins Rash    As a teenager   Tramadol Itching    Social History:  reports that she has been smoking cigarettes. She has a 15.00 pack-year smoking history. She has never used smokeless tobacco. She reports that she does not currently use drugs after having used the following drugs: Cocaine. She reports that she does not drink alcohol.   Family History: Family History  Problem Relation Age of Onset   Coronary artery disease Mother    Lung cancer Mother    Heart attack Father 32   Thyroid disease Maternal Grandmother    Diabetes type I Daughter     Review of Systems: Review of Systems  Constitutional:  Positive  for malaise/fatigue. Negative for chills and fever.  HENT:  Negative for hearing loss.   Respiratory:  Negative for cough.   Cardiovascular:  Negative for chest pain.  Gastrointestinal:  Negative for abdominal pain, nausea and vomiting.  Genitourinary:  Negative for dysuria.  Musculoskeletal:  Negative for myalgias.  Skin:  Negative for rash.       Right gluteal pain/swelling  Neurological:  Negative for dizziness.  Psychiatric/Behavioral:  Negative for depression.     Physical Exam BP 103/71 (BP Location: Right Arm)   Pulse 74   Temp 98.9 F (37.2 C) (Oral)   Resp 13   Ht 5' (1.524 m)   Wt 45.4 kg   SpO2 99%    BMI 19.53 kg/m  CONSTITUTIONAL: No acute distress HEENT:  Normocephalic, atraumatic, extraocular motion intact. NECK: Trachea is midline, and there is no jugular venous distension. RESPIRATORY:  Normal respiratory effort without pathologic use of accessory muscles. CARDIOVASCULAR: Regular rhythm and rate. RECTAL:  there is an area of firmness and induration in the lowe right buttocks consistent with the abscess noted on CT scan. There is no significant erythema and there is no fluctuance. MUSCULOSKELETAL:  Normal muscle strength and tone in all four extremities.  No peripheral edema or cyanosis. SKIN: Skin turgor is normal. There are no pathologic skin lesions.  NEUROLOGIC:  Motor and sensation is grossly normal.  Cranial nerves are grossly intact. PSYCH:  Alert and oriented to person, place and time. Affect is normal.  Laboratory Analysis: Results for orders placed or performed during the hospital encounter of 05/27/22 (from the past 24 hour(s))  CBG monitoring, ED     Status: Abnormal   Collection Time: 05/26/22  7:35 PM  Result Value Ref Range   Glucose-Capillary >600 (HH) 70 - 99 mg/dL  Basic metabolic panel     Status: Abnormal   Collection Time: 05/26/22  7:42 PM  Result Value Ref Range   Sodium 129 (L) 135 - 145 mmol/L   Potassium 4.7 3.5 - 5.1 mmol/L   Chloride 89 (L) 98 - 111 mmol/L   CO2 29 22 - 32 mmol/L   Glucose, Bld 663 (HH) 70 - 99 mg/dL   BUN 34 (H) 6 - 20 mg/dL   Creatinine, Ser 0.98 0.44 - 1.00 mg/dL   Calcium 8.9 8.9 - 10.3 mg/dL   GFR, Estimated >60 >60 mL/min   Anion gap 11 5 - 15  CBC     Status: Abnormal   Collection Time: 05/26/22  7:42 PM  Result Value Ref Range   WBC 12.9 (H) 4.0 - 10.5 K/uL   RBC 5.28 (H) 3.87 - 5.11 MIL/uL   Hemoglobin 14.7 12.0 - 15.0 g/dL   HCT 44.9 36.0 - 46.0 %   MCV 85.0 80.0 - 100.0 fL   MCH 27.8 26.0 - 34.0 pg   MCHC 32.7 30.0 - 36.0 g/dL   RDW 14.6 11.5 - 15.5 %   Platelets 272 150 - 400 K/uL   nRBC 0.0 0.0 - 0.2 %   Urinalysis, Routine w reflex microscopic     Status: Abnormal   Collection Time: 05/26/22  7:43 PM  Result Value Ref Range   Color, Urine YELLOW (A) YELLOW   APPearance HAZY (A) CLEAR   Specific Gravity, Urine 1.027 1.005 - 1.030   pH 7.0 5.0 - 8.0   Glucose, UA >=500 (A) NEGATIVE mg/dL   Hgb urine dipstick LARGE (A) NEGATIVE   Bilirubin Urine NEGATIVE NEGATIVE   Ketones, ur NEGATIVE  NEGATIVE mg/dL   Protein, ur 30 (A) NEGATIVE mg/dL   Nitrite NEGATIVE NEGATIVE   Leukocytes,Ua MODERATE (A) NEGATIVE   RBC / HPF 0-5 0 - 5 RBC/hpf   WBC, UA >50 (H) 0 - 5 WBC/hpf   Bacteria, UA RARE (A) NONE SEEN   Squamous Epithelial / LPF 0-5 0 - 5  Pregnancy, urine     Status: None   Collection Time: 05/26/22  7:43 PM  Result Value Ref Range   Preg Test, Ur NEGATIVE NEGATIVE  Urine Drug Screen, Qualitative ([facility] only)     Status: Abnormal   Collection Time: 05/26/22  7:43 PM  Result Value Ref Range   Tricyclic, Ur Screen NONE DETECTED NONE DETECTED   Amphetamines, Ur Screen NONE DETECTED NONE DETECTED   MDMA (Ecstasy)Ur Screen NONE DETECTED NONE DETECTED   Cocaine Metabolite,Ur [place] POSITIVE (A) NONE DETECTED   Opiate, Ur Screen NONE DETECTED NONE DETECTED   Phencyclidine (PCP) Ur S NONE DETECTED NONE DETECTED   Cannabinoid 50 Ng, Ur [place] NONE DETECTED NONE DETECTED   Barbiturates, Ur Screen NONE DETECTED NONE DETECTED   Benzodiazepine, Ur Scrn NONE DETECTED NONE DETECTED   Methadone Scn, Ur NONE DETECTED NONE DETECTED  Lactic acid, plasma     Status: None   Collection Time: 05/27/22  2:25 AM  Result Value Ref Range   Lactic Acid, Venous 1.9 0.5 - 1.9 mmol/L  Protime-INR     Status: None   Collection Time: 05/27/22  2:25 AM  Result Value Ref Range   Prothrombin Time 11.6 11.4 - 15.2 seconds   INR 0.9 0.8 - 1.2  APTT     Status: None   Collection Time: 05/27/22  2:25 AM  Result Value Ref Range   aPTT 24 24 - 36 seconds  Blood Culture (routine x 2)     Status: None (Preliminary result)    Collection Time: 05/27/22  2:25 AM   Specimen: BLOOD  Result Value Ref Range   Specimen Description BLOOD LEFT ASSIST CONTROL    Special Requests      BOTTLES DRAWN AEROBIC AND ANAEROBIC Blood Culture adequate volume   Culture      NO GROWTH < 12 HOURS Performed at [facility], [address] [place], [place] [address]    Report Status PENDING   Blood Culture (routine x 2)     Status: None (Preliminary result)   Collection Time: 05/27/22  2:25 AM   Specimen: BLOOD  Result Value Ref Range   Specimen Description BLOOD RIGHT FOREARM    Special Requests      BOTTLES DRAWN AEROBIC AND ANAEROBIC Blood Culture results may not be optimal due to an inadequate volume of blood received in culture bottles   Culture      NO GROWTH < 12 HOURS Performed at [facility], [address] [place], [place] [address]    Report Status PENDING   Osmolality     Status: Abnormal   Collection Time: 05/27/22  2:25 AM  Result Value Ref Range   Osmolality 303 (H) 275 - 295 mOsm/kg  Beta-hydroxybutyric acid     Status: None   Collection Time: 05/27/22  2:25 AM  Result Value Ref Range   Beta-Hydroxybutyric Acid 0.18 0.05 - 0.27 mmol/L  Blood gas, venous     Status: Abnormal   Collection Time: 05/27/22  2:25 AM  Result Value Ref Range   pH, Ven 7.42 7.25 - 7.43   pCO2, Ven 48 44 - 60  mmHg   pO2, Ven <31 (LL) 32 - 45 mmHg   Bicarbonate 31.1 (H) 20.0 - 28.0 mmol/L   Acid-Base Excess 5.5 (H) 0.0 - 2.0 mmol/L   O2 Saturation 43.3 %   Patient temperature 37.0    Collection site VEIN   Hepatic function panel     Status: Abnormal   Collection Time: 05/27/22  2:25 AM  Result Value Ref Range   Total Protein 8.0 6.5 - 8.1 g/dL   Albumin 3.8 3.5 - 5.0 g/dL   AST 58 (H) 15 - 41 U/L   ALT 44 0 - 44 U/L   Alkaline Phosphatase 118 38 - 126 U/L   Total Bilirubin 0.5 0.3 - 1.2 mg/dL   Bilirubin, Direct <0.1 0.0 - 0.2 mg/dL   Indirect Bilirubin NOT CALCULATED 0.3 - 0.9 mg/dL  Hemoglobin A1c      Status: Abnormal   Collection Time: 05/27/22  2:25 AM  Result Value Ref Range   Hgb A1c MFr Bld 14.9 (H) 4.8 - 5.6 %   Mean Plasma Glucose 380.93 mg/dL  Magnesium     Status: None   Collection Time: 05/27/22  2:25 AM  Result Value Ref Range   Magnesium 2.2 1.7 - 2.4 mg/dL  Procalcitonin - Baseline     Status: None   Collection Time: 05/27/22  2:25 AM  Result Value Ref Range   Procalcitonin 0.27 ng/mL  MRSA Next Gen by PCR, Nasal     Status: None   Collection Time: 05/27/22  2:25 AM   Specimen: Nasal Mucosa; Nasal Swab  Result Value Ref Range   MRSA by PCR Next Gen NOT DETECTED NOT DETECTED  hCG, quantitative, pregnancy     Status: None   Collection Time: 05/27/22  2:25 AM  Result Value Ref Range   hCG, Beta Chain, Quant, S <1 <5 mIU/mL  Lipase, blood     Status: None   Collection Time: 05/27/22  2:26 AM  Result Value Ref Range   Lipase 46 11 - 51 U/L  CBG monitoring, ED     Status: Abnormal   Collection Time: 05/27/22  3:16 AM  Result Value Ref Range   Glucose-Capillary 391 (H) 70 - 99 mg/dL  Lactic acid, plasma     Status: Abnormal   Collection Time: 05/27/22  4:42 AM  Result Value Ref Range   Lactic Acid, Venous 2.6 (HH) 0.5 - 1.9 mmol/L  CBC     Status: Abnormal   Collection Time: 05/27/22  6:29 AM  Result Value Ref Range   WBC 10.7 (H) 4.0 - 10.5 K/uL   RBC 3.97 3.87 - 5.11 MIL/uL   Hemoglobin 11.2 (L) 12.0 - 15.0 g/dL   HCT 33.5 (L) 36.0 - 46.0 %   MCV 84.4 80.0 - 100.0 fL   MCH 28.2 26.0 - 34.0 pg   MCHC 33.4 30.0 - 36.0 g/dL   RDW 14.4 11.5 - 15.5 %   Platelets 187 150 - 400 K/uL   nRBC 0.0 0.0 - 0.2 %  Basic metabolic panel     Status: Abnormal   Collection Time: 05/27/22  6:29 AM  Result Value Ref Range   Sodium 134 (L) 135 - 145 mmol/L   Potassium 3.0 (L) 3.5 - 5.1 mmol/L   Chloride 100 98 - 111 mmol/L   CO2 31 22 - 32 mmol/L   Glucose, Bld 138 (H) 70 - 99 mg/dL   BUN 19 6 - 20 mg/dL   Creatinine, Ser 0.53 0.44 -  1.00 mg/dL   Calcium 7.8 (L) 8.9  - 10.3 mg/dL   GFR, Estimated >89 >37 mL/min   Anion gap 3 (L) 5 - 15  Magnesium     Status: None   Collection Time: 05/27/22  6:29 AM  Result Value Ref Range   Magnesium 1.7 1.7 - 2.4 mg/dL  CBG monitoring, ED     Status: Abnormal   Collection Time: 05/27/22  6:30 AM  Result Value Ref Range   Glucose-Capillary 142 (H) 70 - 99 mg/dL    Imaging: CT ABDOMEN PELVIS W CONTRAST  Result Date: 05/27/2022 CLINICAL DATA:  Right lower quadrant pain and gluteal abscess. EXAM: CT ABDOMEN AND PELVIS WITH CONTRAST TECHNIQUE: Multidetector CT imaging of the abdomen and pelvis was performed using the standard protocol following bolus administration of intravenous contrast. RADIATION DOSE REDUCTION: This exam was performed according to the departmental dose-optimization program which includes automated exposure control, adjustment of the mA and/or kV according to patient size and/or use of iterative reconstruction technique. CONTRAST:  74mL OMNIPAQUE IOHEXOL 300 MG/ML  SOLN COMPARISON:  Abdomen and pelvis CT 11/28/2020. FINDINGS: Lower chest: Interval resolution of prior right pleural effusion and right lower lobe consolidations. There is mild posterior atelectasis in the lower lobes. Lung bases are otherwise clear. The cardiac size is normal. There is no pericardial effusion. Hepatobiliary: The liver mildly steatotic and measures 17 cm length. The gallbladder is contracted and not well demonstrated but no obvious stones or inflammatory change noted and no biliary dilatation is seen. Pancreas: No focal abnormality. Spleen: No focal abnormality.  No splenomegaly. Adrenals/Urinary Tract: There is no adrenal mass. There is mild bilateral hydronephrosis without evidence of significant ureterectasis, stones or bladder thickening or intravesical stones. Stomach/Bowel: Mild gastric fluid distention is again seen but no outlet obstructing mass. There are thickened folds in the duodenum and jejunum. There is no small bowel  obstruction or inflammatory change. The appendix is obscured distal half by overlapping structures. The proximal appendix is unremarkable. Moderate stool retention is seen without evidence of colitis or diverticulitis. Vascular/Lymphatic: Aortic atherosclerosis. No enlarged abdominal or pelvic lymph nodes. 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 Reproductive: The uterus is intact. The ovaries are not enlarged. Left paravaginal cyst near the pelvic floor is again noted, today measuring 2.0 x 1.4 cm on 2:78, previously 1.9 x 1.1 cm. Other: At the junction of the lower medial right buttock and upper posterior thigh, there is a complex multilocular multiseptated rim enhancing collection measuring 5.0 x 3.0 x 4.8 cm with surrounding edema consistent with cellulitis. This is most consistent with a viscous abscess or phlegmon forming into an abscess. Internal Hounsfield density ranges 40-50 units. There is no incarcerated hernia. There is no free air, free hemorrhage or free fluid. Musculoskeletal: No acute or significant osseous findings. IMPRESSION: 1. 5.0 x 3.0 x 4.8 cm complex multilocular rim enhancing multi-septated collection subcutaneously at the junction of the posteromedial right buttock and posterior upper thigh, surrounding edema consistent with cellulitis. The edema does not grossly extend into the perineal planes but it is very close to the perineum. Internal density is above the typical density of fluid therefore this could be a very viscous abscess or a phlegmon forming into an abscess. 2. 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 3. Constipation. 4. Gastroenteritis. 5. Mild hydronephrosis without hydroureter or stone disease. No significant bladder dilatation. 6. Mildly steatotic liver.  7. Aortic atherosclerosis. 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 Electronically  Signed   By: Telford Nab M.D.   On: 05/27/2022 03:22    Assessment and Plan: This is a 36 y.o. female with right gluteal abscess  --Patient presented with very elevated glucose and although not technically in DKA, close to it.  Her glucose is better and some electrolytes improving, but her lactic acid is a bit elevated to 2.6.  I have started NS+K fluids to hydrate her and will give po Kcl repletion as well.   --Patient reports her cocaine use was either Monday or Tuesday.  Discussed with her that typically it stays in the system for 3 days, so I think it would be prudent to repeat the UDS again today to see if it's cleared.  If so, then we can proceed to OR today for I&D of the abscess.  Would keep NPO for now as a precaution.  If test is positive, then would not be able to take her to OR and can give her a diet.  Tomorrow we can reassess with another test to see if she can get I&D done. --Discussed the procedure at length including risks of bleeding, injury to surrounding structures, the possibility of having to go back for further procedures, the possibility of penrose drains rather than packing of the wound, and she's willing to proceed.  If unable to do surgery today, will discuss with Dr. Christian Mate who is the surgeon on call for this weekend.  I spent 60 minutes dedicated to the care of this patient on the date of this encounter to include pre-visit review of records, face-to-face time with the patient discussing diagnosis and management, and any post-visit coordination of care.   Melvyn Neth, MD [facility] Pg:  [phone]

## 2022-05-27 NOTE — Assessment & Plan Note (Addendum)
CT scan negative for kidney stone or obstruction.  Renal function normal --Monitor BMP

## 2022-05-28 DIAGNOSIS — L0231 Cutaneous abscess of buttock: Secondary | ICD-10-CM

## 2022-05-28 DIAGNOSIS — F1410 Cocaine abuse, uncomplicated: Secondary | ICD-10-CM

## 2022-05-28 LAB — BASIC METABOLIC PANEL
Anion gap: 4 — ABNORMAL LOW (ref 5–15)
BUN: 15 mg/dL (ref 6–20)
CO2: 27 mmol/L (ref 22–32)
Calcium: 7.8 mg/dL — ABNORMAL LOW (ref 8.9–10.3)
Chloride: 103 mmol/L (ref 98–111)
Creatinine, Ser: 0.53 mg/dL (ref 0.44–1.00)
GFR, Estimated: >60 mL/min (ref >60)
Glucose, Bld: 241 mg/dL — ABNORMAL HIGH (ref 70–99)
Potassium: 4.4 mmol/L (ref 3.5–5.1)
Sodium: 134 mmol/L — ABNORMAL LOW (ref 135–145)

## 2022-05-28 LAB — CBC
HCT: 36 % (ref 36.0–46.0)
Hemoglobin: 11.7 g/dL — ABNORMAL LOW (ref 12.0–15.0)
MCH: 27.9 pg (ref 26.0–34.0)
MCHC: 32.5 g/dL (ref 30.0–36.0)
MCV: 85.9 fL (ref 80.0–100.0)
Platelets: 176 10*3/uL (ref 150–400)
RBC: 4.19 MIL/uL (ref 3.87–5.11)
RDW: 14.6 % (ref 11.5–15.5)
WBC: 10.5 10*3/uL (ref 4.0–10.5)
nRBC: 0 % (ref 0.0–0.2)

## 2022-05-28 LAB — C-REACTIVE PROTEIN: CRP: 1.7 mg/dL — ABNORMAL HIGH (ref <1.0)

## 2022-05-28 LAB — GLUCOSE, CAPILLARY
Glucose-Capillary: 219 mg/dL — ABNORMAL HIGH (ref 70–99)
Glucose-Capillary: 148 mg/dL — ABNORMAL HIGH (ref 70–99)
Glucose-Capillary: 322 mg/dL — ABNORMAL HIGH (ref 70–99)
Glucose-Capillary: 274 mg/dL — ABNORMAL HIGH (ref 70–99)

## 2022-05-28 LAB — PROCALCITONIN: Procalcitonin: 0.25 ng/mL

## 2022-05-28 MED ORDER — OXYCODONE HCL 5 MG PO TABS
5.0000 mg | ORAL_TABLET | ORAL | Status: DC | PRN
  Administered 2022-05-28 – 2022-06-01 (×16): 10 mg via ORAL
  Filled 2022-05-28 (×16): qty 2

## 2022-05-28 MED ORDER — ACETAMINOPHEN 500 MG PO TABS
1000.0000 mg | ORAL_TABLET | Freq: Three times a day (TID) | ORAL | Status: DC
  Administered 2022-05-28 – 2022-06-01 (×12): 1000 mg via ORAL
  Filled 2022-05-28 (×12): qty 2

## 2022-05-28 NOTE — Plan of Care (Signed)
  Problem: Activity: Goal: Risk for activity intolerance will decrease Outcome: Progressing   Problem: Nutrition: Goal: Adequate nutrition will be maintained Outcome: Progressing   Problem: Elimination: Goal: Will not experience complications related to urinary retention Outcome: Progressing   

## 2022-05-28 NOTE — Hospital Course (Signed)
[name] is a 36 y.o. female with medical history significant of polysubstance abuse (cocaine, methamphetamine, benzo, tobacco), hypertension, type 1 diabetes, GERD, CHF, CKD, who presented to the ED on 05/27/2022 for evaluation of pain and swelling in the right buttock area.  He also reported dysuria and urinary frequency with lower abdominal pain and pressure in addition to bilateral flank pain.  CT scan of the abdomen pelvis showed a complex multilocular rim-enhancing septated fluid collection consistent with abscess measuring 5.0 x 3.0 x 4.8 cm at the posterior medial right buttock and upper thigh with surrounding edema extending close to the perineum.  Since UDS was positive for cocaine, surgical I&D was deferred.  Urinalysis was consistent with infection with mild bilateral hydronephrosis seen on CT concerning for pyelonephritis.  Patient was admitted to the hospital and started on empiric IV antibiotics for cellulitis and UTI/pyelonephritis.

## 2022-05-28 NOTE — Progress Notes (Signed)
Progress Note   Patient: [name] FUX:[id number] DOB: 1986-08-03 DOA: 05/27/2022     1 DOS: the patient was seen and examined on 05/28/2022   Brief hospital course: Adaria Hole is a 36 y.o. female with medical history significant of polysubstance abuse (cocaine, methamphetamine, benzo, tobacco), hypertension, type 1 diabetes, GERD, CHF, CKD, who presented to the ED on 05/27/2022 for evaluation of pain and swelling in the right buttock area.  He also reported dysuria and urinary frequency with lower abdominal pain and pressure in addition to bilateral flank pain.  CT scan of the abdomen pelvis showed a complex multilocular rim-enhancing septated fluid collection consistent with abscess measuring 5.0 x 3.0 x 4.8 cm at the posterior medial right buttock and upper thigh with surrounding edema extending close to the perineum.  Since UDS was positive for cocaine, surgical I&D was deferred.  Urinalysis was consistent with infection with mild bilateral hydronephrosis seen on CT concerning for pyelonephritis.  Patient was admitted to the hospital and started on empiric IV antibiotics for cellulitis and UTI/pyelonephritis.    Assessment and Plan: * Abscess, gluteal, right Patient presented with elevated lactic acid 2.6 --> 1.6, heart rate 106, but did not meet critical for sepsis as no other SIRS criteria present.   --General surgery consulted --Surgical I&D deferred due to cocaine+ UDS -- Repeat UDS tomorrow morning -- Continue empiric vancomycin and Rocephin -- Pain control as needed -- As needed antiemetics and antipyretics -- Follow cultures   Acute pyelonephritis On empiric IV Rocephin Follow cultures  Bilateral hydronephrosis CT scan negative for kidney stone or obstruction.  Renal function normal --Monitor BMP  Chronic systolic CHF (congestive heart failure) (HCC) 2D echo on 12/03/2020 showed a EF of 30 to 35%, with subsequent echo 10/31/2021 showing recovered EF of 60 to  65%.  No signs of volume overload. -- Monitor volume status  Type 1 diabetes mellitus with other specified complication (HCC) A1c 14.9, poorly controlled.   Presented with blood sugar 600, which improved to 120-130s with subcutaneous insulin.   Takes Lantus 35 unit at home. --Glargine insulin 25 units daily --Sliding scale insulin -- Adjust insulin for inpatient goal 140-180  HTN (hypertension) Hold Coreg, lisinopril due to soft blood pressure. 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  She reports ongoing severe pain due to the gluteal abscess.  She reports very little pain relief with 5 mg oxycodone.  Denies having any increased tolerance to opiates.  She understands why surgical I&D delayed in hopes her UDS is negative for cocaine tomorrow so that the abscess may be drained.  No other acute complaints at this time.  Physical Exam: Vitals:   05/27/22 1725 05/27/22 2155 05/28/22 0330 05/28/22 1010  BP: 107/77 106/75 120/73 95/61  Pulse: 89 81 82 77  Resp: 15 18 16 16   Temp: 97.8 F (36.6 C) 98.2 F (36.8 C) 98.9 F (37.2 C) 98.3 F (36.8 C)  TempSrc:      SpO2: 100% 100% 98%  99%  Weight:      Height:       General exam: awake, alert, no acute distress, underweight, chronically ill-appearing HEENT: moist mucus membranes, hearing grossly normal  Respiratory system: On room air, normal respiratory effort. Cardiovascular system:  Regular rhythm, no pedal edema.   Central nervous system: A&O x3. no gross focal neurologic deficits, normal speech Extremities: moves all, no edema, normal tone Skin: dry, intact, normal temperature, lower medial gluteal cleft with firm and exquisitely tender area of swelling, not significantly erythematous Psychiatry: normal mood, congruent affect, judgement and insight appear normal   Data Reviewed:  Notable labs: Sodium 134, glucose 241 (CBGs later 219, 148), calcium 7.8, anion gap 4, procalcitonin 0.25, normal CBC except hemoglobin 11.7 likely dilutional  Family Communication: None  Disposition: Status is: Inpatient Remains inpatient appropriate because: Severity of illness remaining on empiric IV antibiotics pending culture and surgical I&D of abscess.   Planned Discharge Destination: Home    Time spent: 45 minutes  Author: Pennie Banter, DO 05/28/2022 1:28 PM  For on call review [email or web address].

## 2022-05-28 NOTE — Plan of Care (Signed)
  Problem: Coping: Goal: Ability to adjust to condition or change in health will improve Outcome: Progressing   Problem: Fluid Volume: Goal: Ability to maintain a balanced intake and output will improve Outcome: Progressing   Problem: Health Behavior/Discharge Planning: Goal: Ability to identify and utilize available resources and services will improve Outcome: Progressing   Problem: Health Behavior/Discharge Planning: Goal: Ability to manage health-related needs will improve Outcome: Progressing   Problem: Metabolic: Goal: Ability to maintain appropriate glucose levels will improve Outcome: Progressing   Problem: Nutritional: Goal: Maintenance of adequate nutrition will improve Outcome: Progressing   Problem: Nutritional: Goal: Progress toward achieving an optimal weight will improve Outcome: Progressing   Problem: Skin Integrity: Goal: Risk for impaired skin integrity will decrease Outcome: Progressing   Problem: Tissue Perfusion: Goal: Adequacy of tissue perfusion will improve Outcome: Progressing   Problem: Education: Goal: Knowledge of General Education information will improve Description: Including pain rating scale, medication(s)/side effects and non-pharmacologic comfort measures Outcome: Progressing   Problem: Health Behavior/Discharge Planning: Goal: Ability to manage health-related needs will improve Outcome: Progressing   Problem: Clinical Measurements: Goal: Ability to maintain clinical measurements within normal limits will improve Outcome: Progressing   Problem: Clinical Measurements: Goal: Will remain free from infection Outcome: Progressing   Problem: Clinical Measurements: Goal: Diagnostic test results will improve Outcome: Progressing   Problem: Clinical Measurements: Goal: Respiratory complications will improve Outcome: Progressing   Problem: Clinical Measurements: Goal: Cardiovascular complication will be avoided Outcome:  Progressing   Problem: Activity: Goal: Risk for activity intolerance will decrease Outcome: Progressing   Problem: Nutrition: Goal: Adequate nutrition will be maintained Outcome: Progressing   Problem: Elimination: Goal: Will not experience complications related to bowel motility Outcome: Progressing   Problem: Elimination: Goal: Will not experience complications related to urinary retention Outcome: Progressing   Problem: Pain Managment: Goal: General experience of comfort will improve Outcome: Progressing   Problem: Safety: Goal: Ability to remain free from injury will improve Outcome: Progressing   Problem: Skin Integrity: Goal: Risk for impaired skin integrity will decrease Outcome: Progressing

## 2022-05-28 NOTE — Consult Note (Signed)
Follow up:  Right gluteal abscess, positive UDS for Cocaine, pending I&D.   Interval History:  Laying on the her left side, eating her lunch.  Still having pain tenderness but only to direct pressure.  Denies fevers or chills.  Presentation: [name] is a 36 y.o. female presenting with a 2-3 day history of a worsening pain in the right lower buttocks.  The patient reports that it started as something small and has been growing in size and becoming more tender.  She denies any drainage from the area.  Reports history of two prior cysts that have been I&D on her finger and chest.  She has history of uncontrolled diabetes and on ED presentation, her glucose was 663 and A1c 14.9.  She also has history of drug abuse (cocaine) and has had multiple positive drug screens in the past, including this presentation.   She had a CT scan in the ED which showed a 5 x 3 x 4.8 cm abscess with loculation in the inferior right buttocks.    Past Medical History: Past Medical History:  Diagnosis Date   Acid reflux 2010   CHF (congestive heart failure) (HCC)    Diabetes mellitus    DKA 05/01/2011   Hypertension    Polysubstance abuse [facility])      Past Surgical History: Past Surgical History:  Procedure Laterality Date   TUBAL LIGATION     WOUND EXPLORATION Left 07/26/2014   Procedure: Irrigation, debridement and exploration of elbow wounds;  Surgeon: Betha Loa, MD;  Location: [facility] OR;  Service: Orthopedics;  Laterality: Left;    Home Medications: Prior to Admission medications   Medication Sig Start Date End Date Taking? Authorizing Provider  carvedilol (COREG) 6.25 MG tablet Take 1 tablet (6.25 mg total) by mouth 2 (two) times daily with a meal. Increased from 3.125 mg. 12/31/20 05/27/22 Yes Darlin Priestly, MD  insulin aspart (NOVOLOG FLEXPEN) 100 UNIT/ML FlexPen Inject 1-15 Units into the skin 3 (three) times daily with meals. Dose based on CBG and carb count, 1 unit for every 15 carb. 11/01/21  Yes Tat,  Onalee Hua, MD  insulin glargine (LANTUS SOLOSTAR) 100 UNIT/ML Solostar Pen Inject 17 Units into the skin 2 (two) times daily. Patient taking differently: Inject 35 Units into the skin daily. 11/01/21 10/27/22 Yes Tat, Onalee Hua, MD  lisinopril (ZESTRIL) 5 MG tablet Take 1 tablet (5 mg total) by mouth daily. New medication for your heart. 11/01/21 05/27/22 Yes Tat, Onalee Hua, MD  omeprazole (PRILOSEC) 40 MG capsule Take 40 mg by mouth daily.   Yes [provider]    Allergies: Allergies  Allergen Reactions   Codeine Anaphylaxis and Nausea And Vomiting   Penicillins Rash    As a teenager   Tramadol Itching    Social History:  reports that she has been smoking cigarettes. She has a 15.00 pack-year smoking history. She has never used smokeless tobacco. She reports that she does not currently use drugs after having used the following drugs: Cocaine. She reports that she does not drink alcohol.   Family History: Family History  Problem Relation Age of Onset   Coronary artery disease Mother    Lung cancer Mother    Heart attack Father 37   Thyroid disease Maternal Grandmother    Diabetes type I Daughter     Review of Systems: Review of Systems  Constitutional:  Positive for malaise/fatigue. Negative for chills and fever.  HENT:  Negative for hearing loss.   Respiratory:  Negative for cough.  Cardiovascular:  Negative for chest pain.  Gastrointestinal:  Negative for abdominal pain, nausea and vomiting.  Genitourinary:  Negative for dysuria.  Musculoskeletal:  Negative for myalgias.  Skin:  Negative for rash.       Right gluteal pain/swelling  Neurological:  Negative for dizziness.  Psychiatric/Behavioral:  Negative for depression.     Physical Exam BP 120/73 (BP Location: Right Arm)   Pulse 82   Temp 98.9 F (37.2 C)   Resp 16   Ht 5' (1.524 m)   Wt 45.4 kg   SpO2 98%   BMI 19.53 kg/m  CONSTITUTIONAL: No acute distress HEENT:  Normocephalic, atraumatic, extraocular motion  intact. NECK: Trachea is midline, and there is no jugular venous distension. RESPIRATORY:  Normal respiratory effort without pathologic use of accessory muscles. CARDIOVASCULAR: Regular rhythm and rate.  RECTAL:  there is an area of firmness and induration in the lower right buttocks consistent with the abscess noted on CT scan. There is no significant erythema and there is no fluctuance.  MUSCULOSKELETAL:  Normal muscle strength and tone in all four extremities.  No peripheral edema or cyanosis. SKIN: Skin turgor is normal. There are no pathologic skin lesions.  NEUROLOGIC:  Motor and sensation is grossly normal.  Cranial nerves are grossly intact. PSYCH:  Alert and oriented to person, place and time. Affect is normal.  Laboratory Analysis: Results for orders placed or performed during the hospital encounter of 05/27/22 (from the past 24 hour(s))  Lactic acid, plasma     Status: None   Collection Time: 05/27/22  8:22 AM  Result Value Ref Range   Lactic Acid, Venous 1.6 0.5 - 1.9 mmol/L  CBG monitoring, ED     Status: Abnormal   Collection Time: 05/27/22  8:28 AM  Result Value Ref Range   Glucose-Capillary 126 (H) 70 - 99 mg/dL  Urine Drug Screen, Qualitative ([facility] only)     Status: Abnormal   Collection Time: 05/27/22 10:20 AM  Result Value Ref Range   Tricyclic, Ur Screen NONE DETECTED NONE DETECTED   Amphetamines, Ur Screen POSITIVE (A) NONE DETECTED   MDMA (Ecstasy)Ur Screen NONE DETECTED NONE DETECTED   Cocaine Metabolite,Ur [place] POSITIVE (A) NONE DETECTED   Opiate, Ur Screen NONE DETECTED NONE DETECTED   Phencyclidine (PCP) Ur S NONE DETECTED NONE DETECTED   Cannabinoid 50 Ng, Ur [place] NONE DETECTED NONE DETECTED   Barbiturates, Ur Screen NONE DETECTED NONE DETECTED   Benzodiazepine, Ur Scrn NONE DETECTED NONE DETECTED   Methadone Scn, Ur NONE DETECTED NONE DETECTED  CBG monitoring, ED     Status: Abnormal   Collection Time: 05/27/22 11:29 AM  Result Value Ref Range    Glucose-Capillary 51 (L) 70 - 99 mg/dL  CBG monitoring, ED     Status: None   Collection Time: 05/27/22 12:05 PM  Result Value Ref Range   Glucose-Capillary 78 70 - 99 mg/dL  CBG monitoring, ED     Status: Abnormal   Collection Time: 05/27/22  4:21 PM  Result Value Ref Range   Glucose-Capillary 237 (H) 70 - 99 mg/dL  Sedimentation rate     Status: None   Collection Time: 05/27/22  5:37 PM  Result Value Ref Range   Sed Rate 20 0 - 20 mm/hr  C-reactive protein     Status: Abnormal   Collection Time: 05/27/22  5:37 PM  Result Value Ref Range   CRP 1.7 (H) <1.0 mg/dL  Glucose, capillary     Status: Abnormal  Collection Time: 05/27/22 10:01 PM  Result Value Ref Range   Glucose-Capillary 299 (H) 70 - 99 mg/dL  Procalcitonin     Status: None   Collection Time: 05/28/22  4:20 AM  Result Value Ref Range   Procalcitonin 0.25 ng/mL  Basic metabolic panel     Status: Abnormal   Collection Time: 05/28/22  4:20 AM  Result Value Ref Range   Sodium 134 (L) 135 - 145 mmol/L   Potassium 4.4 3.5 - 5.1 mmol/L   Chloride 103 98 - 111 mmol/L   CO2 27 22 - 32 mmol/L   Glucose, Bld 241 (H) 70 - 99 mg/dL   BUN 15 6 - 20 mg/dL   Creatinine, Ser 4.97 0.44 - 1.00 mg/dL   Calcium 7.8 (L) 8.9 - 10.3 mg/dL   GFR, Estimated >02 >63 mL/min   Anion gap 4 (L) 5 - 15  CBC     Status: Abnormal   Collection Time: 05/28/22  4:20 AM  Result Value Ref Range   WBC 10.5 4.0 - 10.5 K/uL   RBC 4.19 3.87 - 5.11 MIL/uL   Hemoglobin 11.7 (L) 12.0 - 15.0 g/dL   HCT 78.5 88.5 - 02.7 %   MCV 85.9 80.0 - 100.0 fL   MCH 27.9 26.0 - 34.0 pg   MCHC 32.5 30.0 - 36.0 g/dL   RDW 74.1 28.7 - 86.7 %   Platelets 176 150 - 400 K/uL   nRBC 0.0 0.0 - 0.2 %  Glucose, capillary     Status: Abnormal   Collection Time: 05/28/22  7:54 AM  Result Value Ref Range   Glucose-Capillary 219 (H) 70 - 99 mg/dL    Imaging: No results found.  Assessment and Plan: This is a 36 y.o. female with right gluteal abscess  --Patient  reported her last cocaine use was either Monday or Tuesday.  However since her UDS was positive yesterday, I believe it would be prudent to wait until tomorrow to repeat, and proceed with I&D, if it would allow GA.  Would make her NPO after midnight tonight.    --Discussed the procedure at length including risks of bleeding, injury to surrounding structures, the possibility of having to go back for further procedures, the possibility of penrose drains rather than packing of the wound, and she's willing to proceed.    I spent 30 minutes dedicated to the care of this patient on the date of this encounter to include pre-visit review of records, face-to-face time with the patient discussing diagnosis and management, and any post-visit coordination of care.   Campbell Lerner, M.D., [facility]  05/28/2022 ; 8:08 AM

## 2022-05-29 DIAGNOSIS — L0231 Cutaneous abscess of buttock: Secondary | ICD-10-CM | POA: Diagnosis not present

## 2022-05-29 DIAGNOSIS — F1410 Cocaine abuse, uncomplicated: Secondary | ICD-10-CM | POA: Diagnosis not present

## 2022-05-29 LAB — CBC
HCT: 32.9 % — ABNORMAL LOW (ref 36.0–46.0)
Hemoglobin: 10.7 g/dL — ABNORMAL LOW (ref 12.0–15.0)
MCH: 27.9 pg (ref 26.0–34.0)
MCHC: 32.5 g/dL (ref 30.0–36.0)
MCV: 85.7 fL (ref 80.0–100.0)
Platelets: 158 K/uL (ref 150–400)
RBC: 3.84 MIL/uL — ABNORMAL LOW (ref 3.87–5.11)
RDW: 14.5 % (ref 11.5–15.5)
WBC: 11.6 K/uL — ABNORMAL HIGH (ref 4.0–10.5)
nRBC: 0 % (ref 0.0–0.2)

## 2022-05-29 LAB — BASIC METABOLIC PANEL
Anion gap: 3 — ABNORMAL LOW (ref 5–15)
BUN: 14 mg/dL (ref 6–20)
CO2: 29 mmol/L (ref 22–32)
Calcium: 7.8 mg/dL — ABNORMAL LOW (ref 8.9–10.3)
Chloride: 103 mmol/L (ref 98–111)
Creatinine, Ser: 0.49 mg/dL (ref 0.44–1.00)
GFR, Estimated: >60 mL/min (ref >60)
Glucose, Bld: 120 mg/dL — ABNORMAL HIGH (ref 70–99)
Potassium: 3.9 mmol/L (ref 3.5–5.1)
Sodium: 135 mmol/L (ref 135–145)

## 2022-05-29 LAB — URINE DRUG SCREEN, QUALITATIVE (ARMC ONLY)
Amphetamines, Ur Screen: NOT DETECTED
Barbiturates, Ur Screen: NOT DETECTED
Benzodiazepine, Ur Scrn: NOT DETECTED
Cannabinoid 50 Ng, Ur ~~LOC~~: NOT DETECTED
Cocaine Metabolite,Ur ~~LOC~~: POSITIVE — AB
MDMA (Ecstasy)Ur Screen: NOT DETECTED
Methadone Scn, Ur: NOT DETECTED
Opiate, Ur Screen: NOT DETECTED
Phencyclidine (PCP) Ur S: NOT DETECTED
Tricyclic, Ur Screen: NOT DETECTED

## 2022-05-29 LAB — URINE CULTURE: Culture: >=100000 — AB

## 2022-05-29 LAB — GLUCOSE, CAPILLARY
Glucose-Capillary: 122 mg/dL — ABNORMAL HIGH (ref 70–99)
Glucose-Capillary: 239 mg/dL — ABNORMAL HIGH (ref 70–99)
Glucose-Capillary: 314 mg/dL — ABNORMAL HIGH (ref 70–99)
Glucose-Capillary: 262 mg/dL — ABNORMAL HIGH (ref 70–99)

## 2022-05-29 LAB — PROCALCITONIN: Procalcitonin: 0.13 ng/mL

## 2022-05-29 LAB — MAGNESIUM: Magnesium: 1.8 mg/dL (ref 1.7–2.4)

## 2022-05-29 MED ORDER — VANCOMYCIN HCL 750 MG/150ML IV SOLN
750.0000 mg | Freq: Two times a day (BID) | INTRAVENOUS | Status: DC
  Administered 2022-05-29 – 2022-05-31 (×4): 750 mg via INTRAVENOUS
  Filled 2022-05-29 (×4): qty 150

## 2022-05-29 NOTE — Progress Notes (Signed)
Progress Note   Patient: [name] MPN:[id number] DOB: 1986-06-23 DOA: 05/27/2022     2 DOS: the patient was seen and examined on 05/29/2022   Brief hospital course: [name] is a 36 y.o. female with medical history significant of polysubstance abuse (cocaine, methamphetamine, benzo, tobacco), hypertension, type 1 diabetes, GERD, CHF, CKD, who presented to the ED on 05/27/2022 for evaluation of pain and swelling in the right buttock area.  He also reported dysuria and urinary frequency with lower abdominal pain and pressure in addition to bilateral flank pain.  CT scan of the abdomen pelvis showed a complex multilocular rim-enhancing septated fluid collection consistent with abscess measuring 5.0 x 3.0 x 4.8 cm at the posterior medial right buttock and upper thigh with surrounding edema extending close to the perineum.  Since UDS was positive for cocaine, surgical I&D was deferred.  Urinalysis was consistent with infection with mild bilateral hydronephrosis seen on CT concerning for pyelonephritis.  Patient was admitted to the hospital and started on empiric IV antibiotics for cellulitis and UTI/pyelonephritis.    Assessment and Plan: * Abscess, gluteal, right Patient presented with elevated lactic acid 2.6 --> 1.6, heart rate 106, but did not meet criteria for sepsis as no other sepsis criteria present.   --General surgery consulted --Surgical I&D deferred due to cocaine+ UDS -- Repeat UDS 8/13 still positive -- Continue empiric vancomycin and Rocephin -- Pain control as needed -- As needed antiemetics and antipyretics -- Follow cultures   Acute pyelonephritis On empiric IV Rocephin Follow cultures  Bilateral hydronephrosis CT scan negative for kidney stone or obstruction.  Renal function normal --Monitor BMP  Chronic systolic CHF (congestive heart failure) (HCC) 2D echo on 12/03/2020 showed a EF of 30 to 35%, with subsequent echo 10/31/2021 showing recovered EF of  60 to 65%.  No signs of volume overload. -- Monitor volume status  Type 1 diabetes mellitus with other specified complication (HCC) A1c 14.9, poorly controlled.   Presented with blood sugar 600, which improved to 120-130s with subcutaneous insulin.   Takes Lantus 35 unit at home. --Glargine insulin 25 units daily --Sliding scale insulin -- Adjust insulin for inpatient goal 140-180  HTN (hypertension) Hold Coreg, lisinopril due to soft blood pressure. 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  She reports ongoing severe pain from the abscess.  No other acute complaints.  Pt informed not to leave the floor/unit but she denies going outside earlier to smoke.  Physical Exam: Vitals:   05/29/22 0758 05/29/22 1151 05/29/22 1605 05/29/22 1706  BP: 102/68 129/82 92/64 103/61  Pulse: 69 95 78 76  Resp:      Temp: (!) 97.4 F (36.3 C) 98.3 F (36.8 C) 98.6 F (37 C)   TempSrc:      SpO2: 100% 100% 99% 100%  Weight:      Height:       General exam: awake, alert, no acute distress, underweight, chronically ill-appearing Respiratory system: On room air, normal respiratory effort. Cardiovascular system:  brisk cap refill, no pedal edema.   Central nervous system: A&O x3. no gross focal neurologic deficits, normal speech Extremities: moves all, no edema, normal tone Skin: lower medial gluteal cleft with firm and exquisitely tender area of swelling which has  enlarged since yesterday Psychiatry: normal mood, congruent affect, judgement and insight appear normal   Data Reviewed:  Notable labs:glucose 120, ca 7.8, gap 3, wbc 11.6, hbg 10.7.  UDS this AM still positive for cocaine.   Family Communication: None  Disposition: Status is: Inpatient Remains inpatient appropriate because: Severity of illness remaining on empiric IV antibiotics pending culture and surgical I&D of abscess.   Planned Discharge Destination: Home    Time spent: 35 minutes  Author: Pennie Banter, DO 05/29/2022 5:13 PM  For on call review [email or web address].

## 2022-05-29 NOTE — Progress Notes (Signed)
Pharmacy Antibiotic Note  [name] is a 36 y.o. female admitted on 05/27/2022 with  gluteal abscess .  Pharmacy has been consulted for Vancomycin dosing. Patient will have I&D performed pending UDS  -hx DM, drug abuse -also ceftriaxone for UTI/?pyelo  Plan: Will adjust Vancomycin from 1000 mg IV Q 24 hrs to 750 mg IV q12h. Goal AUC 400-550. Expected AUC: 481 SCr used: 0.5   (actual) Expected Cmin 11.8   Height: 5' (152.4 cm) Weight: 45.4 kg (100 lb) IBW/kg (Calculated) : 45.5  Temp (24hrs), Avg:97.9 F (36.6 C), Min:97.4 F (36.3 C), Max:98.1 F (36.7 C)  Recent Labs  Lab 05/26/22 1942 05/27/22 0225 05/27/22 0442 05/27/22 0629 05/27/22 0822 05/28/22 0420 05/29/22 0301  WBC 12.9*  --   --  10.7*  --  10.5 11.6*  CREATININE 0.98  --   --  0.53  --  0.53 0.49  LATICACIDVEN  --  1.9 2.6*  --  1.6  --   --      Estimated Creatinine Clearance: 68.3 mL/min (by C-G formula based on SCr of 0.49 mg/dL).    Allergies  Allergen Reactions   Codeine Anaphylaxis and Nausea And Vomiting   Penicillins Rash    As a teenager   Tramadol Itching    Antimicrobials this admission: Ceftriaxone 8/11 >>  Vancomcyin 8/11 >>   Dose adjustments this admission: 8/13 adj vanc from 1 gm q24h to 750 q12h  Microbiology results: 8/10 Ucx; E.coli only resistant to cipro 8/11 Bcx NG2d MRSA PCR neg  Thank you for allowing pharmacy to be a part of this patient's care.  Bari Mantis PharmD Clinical Pharmacist 05/29/2022

## 2022-05-29 NOTE — Plan of Care (Signed)
  Problem: Activity: Goal: Risk for activity intolerance will decrease Outcome: Progressing   Problem: Pain Managment: Goal: General experience of comfort will improve Outcome: Progressing   Problem: Safety: Goal: Ability to remain free from injury will improve Outcome: Progressing   

## 2022-05-29 NOTE — Consult Note (Signed)
Follow up:  Right gluteal abscess, positive UDS for Cocaine, pending I&D.   Interval History:  Still having pain tenderness but only to direct pressure.  Denies fevers or chills.  Presentation: [name] is a 36 y.o. female presenting with a 2-3 day history of a worsening pain in the right lower buttocks.  The patient reports that it started as something small and has been growing in size and becoming more tender.  She denies any drainage from the area.  Reports history of two prior cysts that have been I&D on her finger and chest.  She has history of uncontrolled diabetes and on ED presentation, her glucose was 663 and A1c 14.9.  She also has history of drug abuse (cocaine) and has had multiple positive drug screens in the past, including this presentation.   She had a CT scan in the ED which showed a 5 x 3 x 4.8 cm abscess with loculation in the inferior right buttocks.      Physical Exam BP (!) 91/59 (BP Location: Right Arm)   Pulse 76   Temp 98.1 F (36.7 C)   Resp 17   Ht 5' (1.524 m)   Wt 45.4 kg   SpO2 96%   BMI 19.53 kg/m  CONSTITUTIONAL: No acute distress HEENT:  Normocephalic, atraumatic, extraocular motion intact. NECK: Trachea is midline, and there is no jugular venous distension. RESPIRATORY:  Normal respiratory effort without pathologic use of accessory muscles. CARDIOVASCULAR: Regular rhythm and rate.  RECTAL:  there is an area of firmness and induration in the lower right buttocks consistent with the abscess noted on CT scan. There is no significant erythema and there is no fluctuance.  MUSCULOSKELETAL:  Normal muscle strength and tone in all four extremities.  No peripheral edema or cyanosis. SKIN: Skin turgor is normal. There are no pathologic skin lesions.  NEUROLOGIC:  Motor and sensation is grossly normal.  Cranial nerves are grossly intact. PSYCH:  Alert and oriented to person, place and time. Affect is normal.  Laboratory Analysis: Results for orders  placed or performed during the hospital encounter of 05/27/22 (from the past 24 hour(s))  Glucose, capillary     Status: Abnormal   Collection Time: 05/28/22  7:54 AM  Result Value Ref Range   Glucose-Capillary 219 (H) 70 - 99 mg/dL  Glucose, capillary     Status: Abnormal   Collection Time: 05/28/22 11:37 AM  Result Value Ref Range   Glucose-Capillary 148 (H) 70 - 99 mg/dL  Glucose, capillary     Status: Abnormal   Collection Time: 05/28/22  5:02 PM  Result Value Ref Range   Glucose-Capillary 322 (H) 70 - 99 mg/dL  Glucose, capillary     Status: Abnormal   Collection Time: 05/28/22 10:39 PM  Result Value Ref Range   Glucose-Capillary 274 (H) 70 - 99 mg/dL  Procalcitonin     Status: None   Collection Time: 05/29/22  3:01 AM  Result Value Ref Range   Procalcitonin 0.13 ng/mL  Basic metabolic panel     Status: Abnormal   Collection Time: 05/29/22  3:01 AM  Result Value Ref Range   Sodium 135 135 - 145 mmol/L   Potassium 3.9 3.5 - 5.1 mmol/L   Chloride 103 98 - 111 mmol/L   CO2 29 22 - 32 mmol/L   Glucose, Bld 120 (H) 70 - 99 mg/dL   BUN 14 6 - 20 mg/dL   Creatinine, Ser 1.66 0.44 - 1.00 mg/dL   Calcium 7.8 (  L) 8.9 - 10.3 mg/dL   GFR, Estimated >94 >85 mL/min   Anion gap 3 (L) 5 - 15  Magnesium     Status: None   Collection Time: 05/29/22  3:01 AM  Result Value Ref Range   Magnesium 1.8 1.7 - 2.4 mg/dL  CBC     Status: Abnormal   Collection Time: 05/29/22  3:01 AM  Result Value Ref Range   WBC 11.6 (H) 4.0 - 10.5 K/uL   RBC 3.84 (L) 3.87 - 5.11 MIL/uL   Hemoglobin 10.7 (L) 12.0 - 15.0 g/dL   HCT 46.2 (L) 70.3 - 50.0 %   MCV 85.7 80.0 - 100.0 fL   MCH 27.9 26.0 - 34.0 pg   MCHC 32.5 30.0 - 36.0 g/dL   RDW 93.8 18.2 - 99.3 %   Platelets 158 150 - 400 K/uL   nRBC 0.0 0.0 - 0.2 %  Urine Drug Screen, Qualitative ([facility] only)     Status: Abnormal   Collection Time: 05/29/22  6:02 AM  Result Value Ref Range   Tricyclic, Ur Screen NONE DETECTED NONE DETECTED   Amphetamines,  Ur Screen NONE DETECTED NONE DETECTED   MDMA (Ecstasy)Ur Screen NONE DETECTED NONE DETECTED   Cocaine Metabolite,Ur [place] POSITIVE (A) NONE DETECTED   Opiate, Ur Screen NONE DETECTED NONE DETECTED   Phencyclidine (PCP) Ur S NONE DETECTED NONE DETECTED   Cannabinoid 50 Ng, Ur [place] NONE DETECTED NONE DETECTED   Barbiturates, Ur Screen NONE DETECTED NONE DETECTED   Benzodiazepine, Ur Scrn NONE DETECTED NONE DETECTED   Methadone Scn, Ur NONE DETECTED NONE DETECTED    Imaging: No results found.  Assessment and Plan: This is a 36 y.o. female with right gluteal abscess  --Patient reported her last cocaine use was either Monday or Tuesday.  However since her UDS was positive again today, I believe it would be prudent to wait until tomorrow to repeat, and proceed with I&D, if it would allow GA.  Would make her NPO after midnight tonight.    --Discussed the procedure at length including risks of bleeding, injury to surrounding structures, the possibility of having to go back for further procedures, the possibility of penrose drains rather than packing of the wound, and she's willing to proceed.    I spent 10 minutes dedicated to the care of this patient on the date of this encounter to include pre-visit review of records, face-to-face time with the patient discussing diagnosis and management, and any post-visit coordination of care.   Campbell Lerner, M.D., [facility]  05/29/2022 ; 6:56 AM

## 2022-05-29 NOTE — TOC Progression Note (Signed)
Transition of Care [facility]) - Progression Note    Patient Details  Name: [name] MRN: [id number] Date of Birth: October 04, 1986  Transition of Care [facility]) CM/SW Contact  Marlowe Sax, RN Phone Number: 05/29/2022, 8:14 AM  Clinical Narrative:      Transition of Care [facility]) Screening Note   Patient Details  Name: [name] Date of Birth: 07-30-1986   Transition of Care [facility]) CM/SW Contact:    Marlowe Sax, RN Phone Number: 05/29/2022, 8:14 AM  Patient has PCP at [facility], pharmacy is [facility]  Transition of Care Department [facility]) has reviewed patient and no TOC needs have been identified at this time. We will continue to monitor patient advancement through interdisciplinary progression rounds. If new patient transition needs arise, please place a TOC consult.         Expected Discharge Plan and Services                                                 Social Determinants of Health (SDOH) Interventions    Readmission Risk Interventions    03/11/2021    3:39 PM  Readmission Risk Prevention Plan  Transportation Screening Complete  PCP or Specialist Appt within 3-5 Days Complete  HRI or Home Care Consult Complete  Social Work Consult for Recovery Care Planning/Counseling Complete  Palliative Care Screening Not Applicable  Medication Review [employer]) Complete

## 2022-05-29 NOTE — Plan of Care (Signed)

## 2022-05-30 ENCOUNTER — Inpatient Hospital Stay: Payer: Medicaid Other | Admitting: Anesthesiology

## 2022-05-30 ENCOUNTER — Other Ambulatory Visit: Payer: Self-pay

## 2022-05-30 ENCOUNTER — Encounter: Payer: Self-pay | Admitting: Internal Medicine

## 2022-05-30 ENCOUNTER — Encounter
Admission: EM | Disposition: A | Discharge status: Home / Self Care | Payer: Self-pay | Source: Home / Self Care | Attending: Internal Medicine

## 2022-05-30 DIAGNOSIS — L0231 Cutaneous abscess of buttock: Secondary | ICD-10-CM | POA: Diagnosis not present

## 2022-05-30 DIAGNOSIS — E440 Moderate protein-calorie malnutrition: Secondary | ICD-10-CM | POA: Insufficient documentation

## 2022-05-30 HISTORY — PX: INCISION AND DRAINAGE ABSCESS: SHX5864

## 2022-05-30 LAB — CBC
HCT: 32.4 % — ABNORMAL LOW (ref 36.0–46.0)
Hemoglobin: 10.6 g/dL — ABNORMAL LOW (ref 12.0–15.0)
MCH: 28 pg (ref 26.0–34.0)
MCHC: 32.7 g/dL (ref 30.0–36.0)
MCV: 85.7 fL (ref 80.0–100.0)
Platelets: 187 10*3/uL (ref 150–400)
RBC: 3.78 MIL/uL — ABNORMAL LOW (ref 3.87–5.11)
RDW: 14.6 % (ref 11.5–15.5)
WBC: 14.5 10*3/uL — ABNORMAL HIGH (ref 4.0–10.5)
nRBC: 0 % (ref 0.0–0.2)

## 2022-05-30 LAB — URINE DRUG SCREEN, QUALITATIVE (ARMC ONLY)
Amphetamines, Ur Screen: NOT DETECTED
Barbiturates, Ur Screen: NOT DETECTED
Benzodiazepine, Ur Scrn: NOT DETECTED
Cannabinoid 50 Ng, Ur ~~LOC~~: NOT DETECTED
Cocaine Metabolite,Ur ~~LOC~~: NOT DETECTED
MDMA (Ecstasy)Ur Screen: NOT DETECTED
Methadone Scn, Ur: NOT DETECTED
Opiate, Ur Screen: NOT DETECTED
Phencyclidine (PCP) Ur S: NOT DETECTED
Tricyclic, Ur Screen: NOT DETECTED

## 2022-05-30 LAB — GLUCOSE, CAPILLARY
Glucose-Capillary: 158 mg/dL — ABNORMAL HIGH (ref 70–99)
Glucose-Capillary: 137 mg/dL — ABNORMAL HIGH (ref 70–99)
Glucose-Capillary: 214 mg/dL — ABNORMAL HIGH (ref 70–99)
Glucose-Capillary: 377 mg/dL — ABNORMAL HIGH (ref 70–99)
Glucose-Capillary: 411 mg/dL — ABNORMAL HIGH (ref 70–99)

## 2022-05-30 LAB — CREATININE, SERUM
Creatinine, Ser: 0.57 mg/dL (ref 0.44–1.00)
GFR, Estimated: >60 mL/min (ref >60)

## 2022-05-30 SURGERY — INCISION AND DRAINAGE, ABSCESS
Anesthesia: General | Laterality: Right

## 2022-05-30 MED ORDER — PROPOFOL 10 MG/ML IV BOLUS
INTRAVENOUS | Status: AC
  Filled 2022-05-30: qty 20

## 2022-05-30 MED ORDER — LIDOCAINE HCL (PF) 2 % IJ SOLN
INTRAMUSCULAR | Status: AC
  Filled 2022-05-30: qty 5

## 2022-05-30 MED ORDER — FENTANYL CITRATE (PF) 100 MCG/2ML IJ SOLN
INTRAMUSCULAR | Status: AC
  Administered 2022-05-30: 50 ug via INTRAVENOUS
  Filled 2022-05-30: qty 2

## 2022-05-30 MED ORDER — ONDANSETRON HCL 4 MG/2ML IJ SOLN
INTRAMUSCULAR | Status: DC | PRN
  Administered 2022-05-30: 4 mg via INTRAVENOUS

## 2022-05-30 MED ORDER — BUPIVACAINE-EPINEPHRINE 0.25% -1:200000 IJ SOLN
INTRAMUSCULAR | Status: DC | PRN
  Administered 2022-05-30: 30 mL

## 2022-05-30 MED ORDER — PHENYLEPHRINE 80 MCG/ML (10ML) SYRINGE FOR IV PUSH (FOR BLOOD PRESSURE SUPPORT)
PREFILLED_SYRINGE | INTRAVENOUS | Status: AC
  Filled 2022-05-30: qty 10

## 2022-05-30 MED ORDER — PROPOFOL 10 MG/ML IV BOLUS
INTRAVENOUS | Status: DC | PRN
  Administered 2022-05-30: 120 mg via INTRAVENOUS

## 2022-05-30 MED ORDER — FENTANYL CITRATE (PF) 100 MCG/2ML IJ SOLN
INTRAMUSCULAR | Status: AC
  Filled 2022-05-30: qty 2

## 2022-05-30 MED ORDER — LIDOCAINE HCL (CARDIAC) PF 100 MG/5ML IV SOSY
PREFILLED_SYRINGE | INTRAVENOUS | Status: DC | PRN
  Administered 2022-05-30: 60 mg via INTRAVENOUS

## 2022-05-30 MED ORDER — KETAMINE HCL 50 MG/5ML IJ SOSY
PREFILLED_SYRINGE | INTRAMUSCULAR | Status: AC
  Filled 2022-05-30: qty 5

## 2022-05-30 MED ORDER — FENTANYL CITRATE (PF) 100 MCG/2ML IJ SOLN
INTRAMUSCULAR | Status: DC | PRN
  Administered 2022-05-30 (×2): 25 ug via INTRAVENOUS

## 2022-05-30 MED ORDER — MIDAZOLAM HCL 2 MG/2ML IJ SOLN
INTRAMUSCULAR | Status: AC
  Filled 2022-05-30: qty 2

## 2022-05-30 MED ORDER — MIDAZOLAM HCL 2 MG/2ML IJ SOLN
INTRAMUSCULAR | Status: DC | PRN
  Administered 2022-05-30: 2 mg via INTRAVENOUS

## 2022-05-30 MED ORDER — INSULIN ASPART 100 UNIT/ML IJ SOLN
5.0000 [IU] | Freq: Once | INTRAMUSCULAR | Status: AC
Start: 2022-05-30 — End: 2022-05-30
  Administered 2022-05-30: 5 [IU] via SUBCUTANEOUS
  Filled 2022-05-30: qty 1

## 2022-05-30 MED ORDER — KETAMINE HCL 10 MG/ML IJ SOLN
INTRAMUSCULAR | Status: DC | PRN
  Administered 2022-05-30: 20 mg via INTRAVENOUS

## 2022-05-30 MED ORDER — INSULIN ASPART 100 UNIT/ML IJ SOLN
4.0000 [IU] | Freq: Three times a day (TID) | INTRAMUSCULAR | Status: DC
  Administered 2022-05-30 – 2022-06-01 (×6): 4 [IU] via SUBCUTANEOUS
  Filled 2022-05-30 (×6): qty 1

## 2022-05-30 MED ORDER — EPHEDRINE 5 MG/ML INJ
INTRAVENOUS | Status: AC
  Filled 2022-05-30: qty 5

## 2022-05-30 MED ORDER — EPHEDRINE SULFATE (PRESSORS) 50 MG/ML IJ SOLN
INTRAMUSCULAR | Status: DC | PRN
  Administered 2022-05-30 (×2): 5 mg via INTRAVENOUS

## 2022-05-30 MED ORDER — PHENYLEPHRINE HCL (PRESSORS) 10 MG/ML IV SOLN
INTRAVENOUS | Status: DC | PRN
  Administered 2022-05-30 (×3): 80 ug via INTRAVENOUS
  Administered 2022-05-30: 160 ug via INTRAVENOUS
  Administered 2022-05-30: 80 ug via INTRAVENOUS

## 2022-05-30 MED ORDER — DEXAMETHASONE SODIUM PHOSPHATE 10 MG/ML IJ SOLN
INTRAMUSCULAR | Status: DC | PRN
  Administered 2022-05-30: 4 mg via INTRAVENOUS

## 2022-05-30 MED ORDER — FENTANYL CITRATE (PF) 100 MCG/2ML IJ SOLN
25.0000 ug | INTRAMUSCULAR | Status: DC | PRN
  Administered 2022-05-30: 50 ug via INTRAVENOUS

## 2022-05-30 SURGICAL SUPPLY — 28 items
BLADE SURG 15 STRL LF DISP TIS (BLADE) ×1 IMPLANT
BLADE SURG 15 STRL SS (BLADE) ×2
DRAIN PENROSE 12X.25 LTX STRL (MISCELLANEOUS) IMPLANT
DRAIN PENROSE 5/8X18 LTX STRL (DRAIN) IMPLANT
DRAPE LAPAROTOMY 77X122 PED (DRAPES) ×2 IMPLANT
ELECT CAUTERY BLADE 6.4 (BLADE) ×2 IMPLANT
ELECT REM PT RETURN 9FT ADLT (ELECTROSURGICAL) ×2
ELECTRODE REM PT RTRN 9FT ADLT (ELECTROSURGICAL) ×1 IMPLANT
GAUZE SPONGE 4X4 12PLY STRL (GAUZE/BANDAGES/DRESSINGS) ×1 IMPLANT
GLOVE ORTHO TXT STRL SZ7.5 (GLOVE) ×2 IMPLANT
GOWN STRL REUS W/ TWL LRG LVL3 (GOWN DISPOSABLE) ×1 IMPLANT
GOWN STRL REUS W/ TWL XL LVL3 (GOWN DISPOSABLE) ×1 IMPLANT
GOWN STRL REUS W/TWL LRG LVL3 (GOWN DISPOSABLE) ×2
GOWN STRL REUS W/TWL XL LVL3 (GOWN DISPOSABLE) ×2
KIT TURNOVER KIT A (KITS) ×2 IMPLANT
MANIFOLD NEPTUNE II (INSTRUMENTS) ×2 IMPLANT
NEEDLE HYPO 22GX1.5 SAFETY (NEEDLE) ×2 IMPLANT
NS IRRIG 1000ML POUR BTL (IV SOLUTION) ×2 IMPLANT
PACK BASIN MINOR ARMC (MISCELLANEOUS) ×2 IMPLANT
PAD ABD DERMACEA PRESS 5X9 (GAUZE/BANDAGES/DRESSINGS) IMPLANT
SOL PREP PVP 2OZ (MISCELLANEOUS) ×2
SOLUTION PREP PVP 2OZ (MISCELLANEOUS) ×1 IMPLANT
SPONGE T-LAP 18X18 ~~LOC~~+RFID (SPONGE) IMPLANT
SUT ETHILON 3-0 FS-10 30 BLK (SUTURE) ×2
SUTURE EHLN 3-0 FS-10 30 BLK (SUTURE) IMPLANT
SWAB CULTURE AMIES ANAERIB BLU (MISCELLANEOUS) IMPLANT
SYR 10ML LL (SYRINGE) ×2 IMPLANT
TRAP FLUID SMOKE EVACUATOR (MISCELLANEOUS) ×2 IMPLANT

## 2022-05-30 NOTE — Plan of Care (Signed)
  Problem: Coping: Goal: Ability to adjust to condition or change in health will improve Outcome: Progressing   Problem: Fluid Volume: Goal: Ability to maintain a balanced intake and output will improve Outcome: Progressing   Problem: Health Behavior/Discharge Planning: Goal: Ability to identify and utilize available resources and services will improve Outcome: Progressing   Problem: Health Behavior/Discharge Planning: Goal: Ability to manage health-related needs will improve Outcome: Progressing   Problem: Metabolic: Goal: Ability to maintain appropriate glucose levels will improve Outcome: Progressing   Problem: Nutritional: Goal: Maintenance of adequate nutrition will improve Outcome: Progressing   Problem: Nutritional: Goal: Progress toward achieving an optimal weight will improve Outcome: Progressing   Problem: Skin Integrity: Goal: Risk for impaired skin integrity will decrease Outcome: Progressing   Problem: Tissue Perfusion: Goal: Adequacy of tissue perfusion will improve Outcome: Progressing   Problem: Education: Goal: Knowledge of General Education information will improve Description: Including pain rating scale, medication(s)/side effects and non-pharmacologic comfort measures Outcome: Progressing   Problem: Health Behavior/Discharge Planning: Goal: Ability to manage health-related needs will improve Outcome: Progressing   Problem: Clinical Measurements: Goal: Ability to maintain clinical measurements within normal limits will improve Outcome: Progressing   Problem: Clinical Measurements: Goal: Will remain free from infection Outcome: Progressing   Problem: Clinical Measurements: Goal: Diagnostic test results will improve Outcome: Progressing   Problem: Clinical Measurements: Goal: Respiratory complications will improve Outcome: Progressing   Problem: Clinical Measurements: Goal: Cardiovascular complication will be avoided Outcome:  Progressing   Problem: Activity: Goal: Risk for activity intolerance will decrease Outcome: Progressing   Problem: Nutrition: Goal: Adequate nutrition will be maintained Outcome: Progressing   Problem: Coping: Goal: Level of anxiety will decrease Outcome: Progressing   Problem: Elimination: Goal: Will not experience complications related to bowel motility Outcome: Progressing   Problem: Elimination: Goal: Will not experience complications related to urinary retention Outcome: Progressing   Problem: Pain Managment: Goal: General experience of comfort will improve Outcome: Progressing   Problem: Safety: Goal: Ability to remain free from injury will improve Outcome: Progressing   Problem: Skin Integrity: Goal: Risk for impaired skin integrity will decrease Outcome: Progressing   

## 2022-05-30 NOTE — Interval H&P Note (Signed)
History and Physical Interval Note:  05/30/2022 8:31 AM  [name]  has presented today for surgery, with the diagnosis of Right buttock/perianal abscess.  The various methods of treatment have been discussed with the patient and family. After consideration of risks, benefits and other options for treatment, the patient has consented to  Procedure(s) with comments: INCISION AND DRAINAGE ABSCESS (Right) - Buttock/perirectal abscess as a surgical intervention.  The patient's history has been reviewed, patient examined, no change in status, stable for surgery.  I have reviewed the patient's chart and labs.  Questions were answered to the patient's satisfaction.     [name]

## 2022-05-30 NOTE — Progress Notes (Signed)
Progress Note   Patient: [name] IRW:[id number] DOB: Oct 21, 1985 DOA: 05/27/2022     3 DOS: the patient was seen and examined on 05/30/2022   Brief hospital course: Azariah Latendresse is a 36 y.o. female with medical history significant of polysubstance abuse (cocaine, methamphetamine, benzo, tobacco), hypertension, type 1 diabetes, GERD, CHF, CKD, who presented to the ED on 05/27/2022 for evaluation of pain and swelling in the right buttock area.  He also reported dysuria and urinary frequency with lower abdominal pain and pressure in addition to bilateral flank pain.  CT scan of the abdomen pelvis showed a complex multilocular rim-enhancing septated fluid collection consistent with abscess measuring 5.0 x 3.0 x 4.8 cm at the posterior medial right buttock and upper thigh with surrounding edema extending close to the perineum.  Since UDS was positive for cocaine, surgical I&D was deferred.  Urinalysis was consistent with infection with mild bilateral hydronephrosis seen on CT concerning for pyelonephritis.  Patient was admitted to the hospital and started on empiric IV antibiotics for cellulitis and UTI/pyelonephritis.    Assessment and Plan: * Abscess, gluteal, right Patient presented with elevated lactic acid 2.6 --> 1.6, heart rate 106, but did not meet criteria for sepsis as no other sepsis criteria present.   8/14: UDS negative, went for surgical I&D this AM --General surgery consulted -- Follow abscess cultures -- Continue empiric vancomycin and Rocephin -- Pain control as needed -- As needed antiemetics and antipyretics   Acute pyelonephritis On empiric IV Rocephin Follow cultures  Bilateral hydronephrosis CT scan negative for kidney stone or obstruction.  Renal function normal --Monitor BMP  Chronic systolic CHF (congestive heart failure) (HCC) 2D echo on 12/03/2020 showed a EF of 30 to 35%, with subsequent echo 10/31/2021 showing recovered EF of 60 to 65%.  No signs  of volume overload. -- Monitor volume status  Type 1 diabetes mellitus with other specified complication (HCC) A1c 14.9, poorly controlled.   Presented with blood sugar 600, which improved to 120-130s with subcutaneous insulin.   Takes Lantus 35 unit at home. --Glargine insulin 25 units daily --Add mealtime Novolog 4 units TID --Continue sliding scale insulin --Adjust insulin for inpatient goal 140-180 --Appreciate diabetes coordinator recommendations  HTN (hypertension) Hold Coreg, lisinopril due to soft blood pressure. IV hydralazine as needed  Tobacco abuse Patient counseled on importance of smoking cessation -Nicotine patch  Hypokalemia Potassium 3.0 on admission was replaced., Magnesium 1.7 --Monitor BMP, Mg and replace K and mag as needed  Protein-calorie malnutrition, severe (HCC) Body weight 45.4 kg, BMI 19.53 - Nutrition consult -Started Ensure  Cocaine abuse (HCC) Patient counseled about importance of quitting cocaine and other substance use   Gartner's duct cyst Outpatient follow-up with OB/GYN        Subjective: Patient seen just after returning from OR this AM.  She reports she is hurting, but otherwise no acute complaints.  Glad to have abscess drained.  No other acute complaints at this time, awaiting meal  Physical Exam: Vitals:   05/30/22 0955 05/30/22 0956 05/30/22 0958 05/30/22 1018  BP:   124/70 124/83  Pulse:   75 84  Resp:   16 16  Temp:   97.8 F (36.6 C)   TempSrc:      SpO2: 94% 93% 98% 99%  Weight:      Height:       General exam: awake, alert, no acute distress, underweight, chronically ill-appearing Respiratory system: On room air, normal respiratory effort. Lungs clear no wheezes or  rhonchi Cardiovascular system: RRR, no peripheral edema.   Central nervous system: A&O x3. no gross focal neurologic deficits, normal speech Extremities: moves all, no edema, normal tone Skin: dry, intact, normal temp Psychiatry: normal mood, flat  affect, judgement and insight appear normal   Data Reviewed:  Notable labs: UDS negative today, CBG's 158 >> 137 >> 214   Family Communication: None  Disposition: Status is: Inpatient Remains inpatient appropriate because: Severity of illness remaining on empiric IV antibiotics pending culture results.   Planned Discharge Destination: Home    Time spent: 35 minutes  Author: Pennie Banter, DO 05/30/2022 1:35 PM  For on call review [email or web address].

## 2022-05-30 NOTE — Progress Notes (Signed)
       CROSS COVER NOTE  NAME: Komal Stangelo MRN: [id number] DOB : 01-15-1986    Date of Service   05/30/22  HPI/Events of Note   Notified by nursing of CBG of 411. CBG has been progressively increasing today 137-->214-->377-->411. She was administered Decadron this AM  Interventions   Plan: 10U Novolog 3AM CBG check     This document was prepared using Dragon voice recognition software and may include unintentional dictation errors.  Bishop Limbo DNP, MHA, FNP-BC Nurse Practitioner [facility] Pager [phone]

## 2022-05-30 NOTE — Anesthesia Postprocedure Evaluation (Signed)
Anesthesia Post Note  Patient: [name]  Procedure(s) Performed: INCISION AND DRAINAGE ABSCESS (Right)  Patient location during evaluation: PACU Anesthesia Type: General Level of consciousness: awake and alert Pain management: pain level controlled Vital Signs Assessment: post-procedure vital signs reviewed and stable Respiratory status: spontaneous breathing, nonlabored ventilation, respiratory function stable and patient connected to nasal cannula oxygen Cardiovascular status: blood pressure returned to baseline and stable Postop Assessment: no apparent nausea or vomiting Anesthetic complications: no   No notable events documented.   Last Vitals:  Vitals:   05/30/22 0956 05/30/22 0958  BP:  124/70  Pulse:  75  Resp:  16  Temp:  36.6 C  SpO2: 93% 98%    Last Pain:  Vitals:   05/30/22 0958  TempSrc:   PainSc: 4                  Cleda Mccreedy Kerem Gilmer

## 2022-05-30 NOTE — Transfer of Care (Signed)
Immediate Anesthesia Transfer of Care Note  Patient: [name]  Procedure(s) Performed: INCISION AND DRAINAGE ABSCESS (Right)  Patient Location: PACU  Anesthesia Type:General  Level of Consciousness: drowsy  Airway & Oxygen Therapy: Patient Spontanous Breathing and Patient connected to face mask oxygen  Post-op Assessment: Report given to RN and Post -op Vital signs reviewed and stable  Post vital signs: Reviewed and stable  Last Vitals:  Vitals Value Taken Time  BP 106/68 05/30/22 0930  Temp 36.6 C 05/30/22 0926  Pulse 81 05/30/22 0932  Resp 12 05/30/22 0932  SpO2 95 % 05/30/22 0932  Vitals shown include unvalidated device data.  Last Pain:  Vitals:   05/30/22 0758  TempSrc: Temporal  PainSc: 8       Patients Stated Pain Goal: 3 (05/29/22 0600)  Complications: No notable events documented.

## 2022-05-30 NOTE — Anesthesia Preprocedure Evaluation (Signed)
Anesthesia Evaluation  Patient identified by MRN, date of birth, ID band Patient awake    Reviewed: Allergy & Precautions, NPO status , Patient's Chart, lab work & pertinent test results  History of Anesthesia Complications Negative for: history of anesthetic complications  Airway Mallampati: III  TM Distance: >3 FB Neck ROM: full    Dental  (+) Missing   Pulmonary neg shortness of breath, Current Smoker and Patient abstained from smoking.,    Pulmonary exam normal        Cardiovascular hypertension, +CHF  Normal cardiovascular exam     Neuro/Psych negative neurological ROS  negative psych ROS   GI/Hepatic GERD  Controlled,(+)     substance abuse  ,   Endo/Other  negative endocrine ROSdiabetes, Type 2, Insulin Dependent  Renal/GU Renal disease     Musculoskeletal   Abdominal   Peds  Hematology negative hematology ROS (+)   Anesthesia Other Findings Past Medical History: 2010: Acid reflux No date: CHF (congestive heart failure) (HCC) No date: Diabetes mellitus 05/01/2011: DKA No date: Hypertension No date: Polysubstance abuse (HCC)  Past Surgical History: No date: TUBAL LIGATION 07/26/2014: WOUND EXPLORATION; Left     Comment:  Procedure: Irrigation, debridement and exploration of               elbow wounds;  Surgeon: Betha Loa, MD;  [facility]               OR;  Service: Orthopedics;  Laterality: Left;  BMI    Body Mass Index: 19.53 kg/m      Reproductive/Obstetrics negative OB ROS                             Anesthesia Physical Anesthesia Plan  ASA: 3  Anesthesia Plan: General LMA   Post-op Pain Management:    Induction: Intravenous  PONV Risk Score and Plan: Dexamethasone, Ondansetron, Midazolam and Treatment may vary due to age or medical condition  Airway Management Planned: LMA  Additional Equipment:   Intra-op Plan:   Post-operative Plan:  Extubation in OR  Informed Consent: I have reviewed the patients History and Physical, chart, labs and discussed the procedure including the risks, benefits and alternatives for the proposed anesthesia with the patient or authorized representative who has indicated his/her understanding and acceptance.     Dental Advisory Given  Plan Discussed with: Anesthesiologist, CRNA and Surgeon  Anesthesia Plan Comments: (Patient consented for risks of anesthesia including but not limited to:  - adverse reactions to medications - damage to eyes, teeth, lips or other oral mucosa - nerve damage due to positioning  - sore throat or hoarseness - Damage to heart, brain, nerves, lungs, other parts of body or loss of life  Patient voiced understanding.)        Anesthesia Quick Evaluation

## 2022-05-30 NOTE — Progress Notes (Signed)
Nutrition Follow-up  DOCUMENTATION CODES:   Non-severe (moderate) malnutrition in context of social or environmental circumstances  INTERVENTION:   -D/c Ensure Enlive -D/c Juven -Continue MVI with minerals daily -Continue Ensure Max po daily, each supplement provides 150 kcal and 30 grams of protein -Continue 500 mg vitamin C BID -Continue 220 mg zinc sulfate daily x 14 days  NUTRITION DIAGNOSIS:   Moderate Malnutrition related to social / environmental circumstances as evidenced by mild fat depletion, moderate fat depletion, mild muscle depletion, moderate muscle depletion.  Ongoing  GOAL:   Patient will meet greater than or equal to 90% of their needs  Progressing   MONITOR:   PO intake, Supplement acceptance  REASON FOR ASSESSMENT:   Consult Assessment of nutrition requirement/status  ASSESSMENT:   Pt with past medical history of CHF, HTN and DM as well as recurrent infections who presents for evaluation of 3 days of pain and swelling in the right medial gluteal cleft concerning for an abscess.  8/14- s/p I&D of rt gluteal abscess  Reviewed I/O's: +3.1 L x 24 hours and +5.3 L since admission   Pt sitting up in bed at time of visit. She reports that she is not feeling well today and states that she is annoyed with who she is talking on with. Per pt, she has not had anything to eat yet today. Offered to order meal for her, but she refused.   Pt currently in a carb modified diet. Noted meal completions 90-100%. Pt is refusing Ensure and Juven supplements.   Medications reviewed and include vitamin C, zinc sulfate, and 0.9% sodium chloride infusion @ 100 ml/hr.   Labs reviewed: CBGS: 137-214 (inpatient orders for glycemic control are 0-5 units insulin aspart daily at bedtime, 4 units insulin aspart TID with meals, 25 units insulin glarigne-yfgn daily).    NUTRITION - FOCUSED PHYSICAL EXAM:  Flowsheet Row Most Recent Value  Orbital Region Mild depletion  Upper  Arm Region Moderate depletion  Thoracic and Lumbar Region Mild depletion  Buccal Region Mild depletion  Temple Region Mild depletion  Clavicle Bone Region Moderate depletion  Clavicle and Acromion Bone Region Moderate depletion  Scapular Bone Region Moderate depletion  Dorsal Hand Mild depletion  Patellar Region Mild depletion  Anterior Thigh Region Mild depletion  Posterior Calf Region Mild depletion  Edema (RD Assessment) None  Hair Reviewed  Eyes Reviewed  Mouth Reviewed  Skin Reviewed  Nails Reviewed       Diet Order:   Diet Order             Diet Carb Modified Fluid consistency: Thin; Room service appropriate? Yes  Diet effective now                   EDUCATION NEEDS:   No education needs have been identified at this time  Skin:  Skin Assessment: Skin Integrity Issues: Skin Integrity Issues:: Incisions Incisions: closed perineum  Last BM:  05/29/22  Height:   Ht Readings from Last 1 Encounters:  05/26/22 5' (1.524 m)    Weight:   Wt Readings from Last 1 Encounters:  05/26/22 45.4 kg    Ideal Body Weight:  45.5 kg  BMI:  Body mass index is 19.53 kg/m.  Estimated Nutritional Needs:   Kcal:  1600-1800  Protein:  75-90 grams  Fluid:  > 1.6 L    Levada Schilling, RD, LDN, CDCES Registered Dietitian II Certified Diabetes Care and Education Specialist Please refer to [facility] for RD and/or RD  on-call/weekend/after hours pager

## 2022-05-30 NOTE — Inpatient Diabetes Management (Signed)
Inpatient Diabetes Program Recommendations  AACE/ADA: New Consensus Statement on Inpatient Glycemic Control (2015)  Target Ranges:  Prepandial:   less than 140 mg/dL      Peak postprandial:   less than 180 mg/dL (1-2 hours)      Critically ill patients:  140 - 180 mg/dL    Latest Reference Range & Units 05/29/22 07:56 05/29/22 11:55 05/29/22 16:47 05/29/22 20:24  Glucose-Capillary 70 - 99 mg/dL 885 (H) 027 (H)  3 units Novolog  25 units Semglee @1220   314 (H)  7 units Novolog  262 (H)  3 units Novolog @2237   (H): Data is abnormally high   History: Type 1 Diabetes (makes NO insulin; requires basal, correction, and meal coverage insulin)   Home DM Meds: Novolog 1 unit for every 15 grams Carbohydrates                             Novolog for Correction                             Lantus 35 units Daily   Current Orders: Novolog Sensitive Correction Scale/ SSI (0-9 units) TID AC + HS     Semglee 25 units Daily     NPO today for I&D  MD- After pt resumes PO diet today, please consider starting Novolog Meal Coverage:  Novolog 4 units TID with meals HOLD if pt eats <50% meals, HOLD if NPO    --Will follow patient during hospitalization--  RN, MSN, CDCES Diabetes Coordinator Inpatient Glycemic Control Team Team Pager: [phone] (8a-5p)

## 2022-05-30 NOTE — Op Note (Signed)
Incision and drainage of right gluteal abscess.  Pre-operative Diagnosis: Right gluteal abscess  Post-operative Diagnosis: same.    Surgeon: Campbell Lerner, M.D., FACS  Anesthesia: General  Findings: Large multiloculated right gluteal abscess as anticipated, no malodor noted.  Estimated Blood Loss: 15 mL         Specimens: Purulent drainage for culture and sensitivity.          Complications: none              Procedure Details  The patient was seen again in the Holding Room. The benefits, complications, treatment options, and expected outcomes were discussed with the patient. The risks of bleeding, infection, recurrence of symptoms, failure to resolve symptoms, unanticipated injury, prosthetic placement, prosthetic infection, any of which could require further surgery were reviewed with the patient. The likelihood of improving the patient's symptoms with return to their baseline status is good.  The patient and/or family concurred with the proposed plan, giving informed consent.  The patient was taken to Operating Room, identified and the procedure verified.    Prior to the induction of general anesthesia, antibiotic prophylaxis was administered. VTE prophylaxis was in place.  General anesthesia was then administered and tolerated well. After the induction, the patient was positioned in the lithotomy position and the buttocks and perineum were prepped with Betadine and draped in the sterile fashion.  A Time Out was held and the above information confirmed.  I selected 2 sites about 8 cm apart on an access from anterior to posterior that would allow drainage of the extreme anterior and posterior aspects of the abscess.  I made incisions in those points before draining, and then proceeded to progress through the soft tissues into the abscess cavity with electrocautery maintaining excellent hemostasis.  Specimens obtained and sent for culture and sensitivity. Both incisions were about 2 cm  in size, I then utilized a suction tip to bluntly dissected and the lysis any loculations.  I was able to digitally confirm the same.  I then irrigated the cavity with several volumes of normal saline solution.  Local infiltration with quarter percent Marcaine with epinephrine is applied to the cavity and its depths.  I then placed a 3/8 Penrose drain transversing the cavity in both incisions.  It was secured at each exit site with 3-0 nylon.  We then then applied fluffs ABD and mesh panties to secure the dressing. Needle sponge instrument count reported correct.  Classification of wound is infected. She appeared tolerated procedure well.     Campbell Lerner M.D., [facility] 05/30/2022 9:25 AM

## 2022-05-31 DIAGNOSIS — L0231 Cutaneous abscess of buttock: Secondary | ICD-10-CM | POA: Diagnosis not present

## 2022-05-31 LAB — BASIC METABOLIC PANEL
Anion gap: 4 — ABNORMAL LOW (ref 5–15)
BUN: 19 mg/dL (ref 6–20)
CO2: 27 mmol/L (ref 22–32)
Calcium: 7.9 mg/dL — ABNORMAL LOW (ref 8.9–10.3)
Chloride: 105 mmol/L (ref 98–111)
Creatinine, Ser: 0.45 mg/dL (ref 0.44–1.00)
GFR, Estimated: >60 mL/min (ref >60)
Glucose, Bld: 289 mg/dL — ABNORMAL HIGH (ref 70–99)
Potassium: 4.5 mmol/L (ref 3.5–5.1)
Sodium: 136 mmol/L (ref 135–145)

## 2022-05-31 LAB — CBC
HCT: 31.3 % — ABNORMAL LOW (ref 36.0–46.0)
Hemoglobin: 10.1 g/dL — ABNORMAL LOW (ref 12.0–15.0)
MCH: 27.8 pg (ref 26.0–34.0)
MCHC: 32.3 g/dL (ref 30.0–36.0)
MCV: 86.2 fL (ref 80.0–100.0)
Platelets: 283 10*3/uL (ref 150–400)
RBC: 3.63 MIL/uL — ABNORMAL LOW (ref 3.87–5.11)
RDW: 14.8 % (ref 11.5–15.5)
WBC: 11.8 10*3/uL — ABNORMAL HIGH (ref 4.0–10.5)
nRBC: 0 % (ref 0.0–0.2)

## 2022-05-31 LAB — MAGNESIUM: Magnesium: 2 mg/dL (ref 1.7–2.4)

## 2022-05-31 LAB — GLUCOSE, CAPILLARY
Glucose-Capillary: 217 mg/dL — ABNORMAL HIGH (ref 70–99)
Glucose-Capillary: 256 mg/dL — ABNORMAL HIGH (ref 70–99)
Glucose-Capillary: 147 mg/dL — ABNORMAL HIGH (ref 70–99)
Glucose-Capillary: 173 mg/dL — ABNORMAL HIGH (ref 70–99)
Glucose-Capillary: 190 mg/dL — ABNORMAL HIGH (ref 70–99)

## 2022-05-31 MED ORDER — VANCOMYCIN HCL IN DEXTROSE 1-5 GM/200ML-% IV SOLN
1000.0000 mg | INTRAVENOUS | Status: DC
  Administered 2022-06-01: 1000 mg via INTRAVENOUS
  Filled 2022-05-31: qty 200

## 2022-05-31 NOTE — Progress Notes (Signed)
[facility] SURGICAL PROGRESS NOTE  Hospital Day(s): 4.   Post op day(s): 1 Day Post-Op.   Interval History:  Patient seen and examined No acute events or new complaints overnight.  Patient reports she has gluteal pain but improving; pain medications helping No fever, chills Leukocytosis improving; 11.8K Renal function normal; sCr - 0.45; UO - unmeasured No electrolyte derangements Hyperglycemia improving; 289 Cx growing GPC; on Rocephin and Vancomycin  Vital signs in last 24 hours: [min-max] current  Temp:  [97.6 F (36.4 C)-98.7 F (37.1 C)] 98.7 F (37.1 C) (08/15 0525) Pulse Rate:  [72-92] 80 (08/15 0525) Resp:  [12-20] 20 (08/15 0525) BP: (106-149)/(62-92) 144/92 (08/15 0525) SpO2:  [93 %-100 %] 97 % (08/15 0525)     Height: 5' (152.4 cm) Weight: 45.4 kg BMI (Calculated): 19.53   Intake/Output last 2 shifts:  08/14 0701 - 08/15 0700 In: 200 [I.V.:200] Out: 1 [Blood:1]   Physical Exam:  Constitutional: alert, cooperative and no distress  Respiratory: breathing non-labored at rest  Cardiovascular: regular rate and sinus rhythm  Integumentary: Bedside RN at bedside to chaperone, right gluteal I&D; penrose in place, erythema improved, no fluctuance remaining, serosanguinous drainage on dressing  Labs:     Latest Ref Rng & Units 05/31/2022    6:05 AM 05/30/2022    3:28 AM 05/29/2022    3:01 AM  CBC  WBC 4.0 - 10.5 K/uL 11.8  14.5  11.6   Hemoglobin 12.0 - 15.0 g/dL 06.2  69.4  85.4   Hematocrit 36.0 - 46.0 % 31.3  32.4  32.9   Platelets 150 - 400 K/uL 283  187  158       Latest Ref Rng & Units 05/31/2022    6:05 AM 05/30/2022    3:28 AM 05/29/2022    3:01 AM  CMP  Glucose 70 - 99 mg/dL 627   035   BUN 6 - 20 mg/dL 19   14   Creatinine 0.09 - 1.00 mg/dL 3.81  8.29  9.37   Sodium 135 - 145 mmol/L 136   135   Potassium 3.5 - 5.1 mmol/L 4.5   3.9   Chloride 98 - 111 mmol/L 105   103   CO2 22 - 32 mmol/L 27   29   Calcium 8.9 - 10.3 mg/dL 7.9    7.8     Imaging studies: No new pertinent imaging studies   Assessment/Plan: 36 y.o. female with improving leukocytosis otherwise doing well 1 Day Post-Op s/p incision and drainage of right gluteal abscess.   - Okay to continue carb modified diet  - Continue IV Abx (Rocephin, Vancomycin); Cx with GPC; narrow for DC  - Wound Care: superficial dressings PRN; penrose in place, she will go home with this   - Pain control prn - Monitor leukocytosis; improved - Glycemic control     - Discharge Planning: She will benefit from another 24 hours IV Abx; follow up Cx and narrow for home. Anticiapte DC home tomorrow (08/16).   All of the above findings and recommendations were discussed with the patient, and the medical team, and all of patient's questions were answered to her expressed satisfaction.  -- Lynden Oxford, PA-C [facility] 05/31/2022, 7:45 AM M-F: 7am - 4pm

## 2022-05-31 NOTE — Inpatient Diabetes Management (Signed)
Inpatient Diabetes Program Recommendations  AACE/ADA: New Consensus Statement on Inpatient Glycemic Control (2015)  Target Ranges:  Prepandial:   less than 140 mg/dL      Peak postprandial:   less than 180 mg/dL (1-2 hours)      Critically ill patients:  140 - 180 mg/dL    Latest Reference Range & Units 05/30/22 07:34 05/30/22 09:30 05/30/22 11:21 05/30/22 17:00 05/30/22 21:37 05/31/22 02:45  Glucose-Capillary 70 - 99 mg/dL 416 (H) 384 (H)   4 mg Decadron @0849  214 (H)  7 units Novolog  25 units Semglee 377 (H)  13 units Novolog  411 (H)  10 units Novolog @2217  217 (H)  (H): Data is abnormally high  Latest Reference Range & Units 05/31/22 08:18 05/31/22 12:13  Glucose-Capillary 70 - 99 mg/dL 06/02/22 (H)  9 units Novolog  25 units Semglee @0957  147 (H)  (H): Data is abnormally high    History: Type 1 Diabetes (makes NO insulin; requires basal, correction, and meal coverage insulin)   Home DM Meds: Novolog 1 unit for every 15 grams Carbohydrates                             Novolog for Correction                             Lantus 35 units Daily   Current Orders: Novolog Sensitive Correction Scale/ SSI (0-9 units) TID AC + HS                           Semglee 25 units Daily      Novolog 4 units TID with meals    MD- Note Novolog Meal Coverage started yesterday at 12pm with Lunch after surgery.  Pt did receive Decadron X 1 dose yest AM--This could be why CBGs ran so high last PM and again this AM  CBG down to 147 at 12pm today.  Will follow   --Will follow patient during hospitalization--  06/02/22 RN, MSN, CDCES Diabetes Coordinator Inpatient Glycemic Control Team Team Pager: [phone] (8a-5p)

## 2022-05-31 NOTE — Progress Notes (Signed)
Progress Note   Patient: [name] GUY:[id number] DOB: 1986-07-27 DOA: 05/27/2022     4 DOS: the patient was seen and examined on 05/31/2022   Brief hospital course: [name] is a 36 y.o. female with medical history significant of polysubstance abuse (cocaine, methamphetamine, benzo, tobacco), hypertension, type 1 diabetes, GERD, CHF, CKD, who presented to the ED on 05/27/2022 for evaluation of pain and swelling in the right buttock area.  He also reported dysuria and urinary frequency with lower abdominal pain and pressure in addition to bilateral flank pain.  CT scan of the abdomen pelvis showed a complex multilocular rim-enhancing septated fluid collection consistent with abscess measuring 5.0 x 3.0 x 4.8 cm at the posterior medial right buttock and upper thigh with surrounding edema extending close to the perineum.  Since UDS was positive for cocaine, surgical I&D was deferred.  Urinalysis was consistent with infection with mild bilateral hydronephrosis seen on CT concerning for pyelonephritis.  Patient was admitted to the hospital and started on empiric IV antibiotics for cellulitis and UTI/pyelonephritis.    Assessment and Plan: * Abscess, gluteal, right Patient presented with elevated lactic acid 2.6 --> 1.6, heart rate 106, but did not meet criteria for sepsis as no other sepsis criteria present.   8/14: UDS negative, surgical I&D done 8/15: abscess Cx growing rare GPC's --General surgery consulted -- Follow cultures for ID & sensitivities -- Continue empiric vancomycin and Rocephin -- Pain control as needed -- As needed antiemetics and antipyretics   Acute pyelonephritis On IV Rocephin until abscess cultures. Urine culture grew E coli resistant only to Cipro.  Bilateral hydronephrosis CT scan negative for kidney stone or obstruction.  Renal function normal --Monitor BMP  Chronic systolic CHF (congestive heart failure) (HCC) 2D echo on 12/03/2020 showed a EF  of 30 to 35%, with subsequent echo 10/31/2021 showing recovered EF of 60 to 65%.  No signs of volume overload. -- Monitor volume status  Type 1 diabetes mellitus with other specified complication (HCC) A1c 14.9, poorly controlled.   Presented with blood sugar 600, which improved to 120-130s with subcutaneous insulin.   Takes Lantus 35 unit at home. --Glargine insulin 25 units daily --Add mealtime Novolog 4 units TID --Continue sliding scale insulin --Adjust insulin for inpatient goal 140-180 --Appreciate diabetes coordinator recommendations  HTN (hypertension) Hold Coreg, lisinopril due to soft blood pressure. 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 --Appreciate RD recommendations --Started on MVI, Ensure Max, vit C, zinc        Subjective: Patient ambulating in room when seen this AM.  She reports ongoing pain but a little better after abscess drained in surgery yesterday. No fever/chills. Eating well.  Hopes to go home soon.  Has a drain in the abscess area.  Physical Exam: Vitals:   05/31/22 0525 05/31/22 0830 05/31/22 1212 05/31/22 1758  BP: (!) 144/92 139/85 122/78 137/85  Pulse: 80 74 84 73  Resp: 20 18 18 18   Temp: 98.7 F (37.1 C) 97.7 F (36.5 C) 98.1 F (36.7 C) 98.4 F (36.9 C)  TempSrc:  Oral Oral  Oral  SpO2: 97% 98% 100% 98%  Weight:      Height:       General exam: awake, alert, no acute distress, underweight, chronically ill-appearing Respiratory system: On room air,  normal respiratory effort.  Cardiovascular system: RRR, no peripheral edema.   Central nervous system: A&O x3. no gross focal neurologic deficits, normal speech Extremities: moves all, no edema, normal tone Skin: drain present in right gluteal cleft abscess area, no visible drainage dry, intact, normal temp Psychiatry: normal mood, flat affect, judgement and insight appear normal   Data Reviewed:  Notable labs: glucose 289, Ca 7.9, gap 4, wbc 11.8, hbg 10.1   Family Communication: None  Disposition: Status is: Inpatient Remains inpatient appropriate because: Severity of illness remaining on empiric IV antibiotics pending culture results.   Planned Discharge Destination: Home    Time spent: 35 minutes  Author: Pennie Banter, DO 05/31/2022 7:21 PM  For on call review [email or web address].

## 2022-05-31 NOTE — Progress Notes (Signed)
Pharmacy Antibiotic Note  [name] is a 36 y.o. female admitted on 05/27/2022 with  gluteal abscess . PMH significant for  polysubstance abuse (cocaine, methamphetamine, benzo, tobacco), hypertension, T1DM, GERD, CHF, CKD. Patient underwent I&D of R buttock/perianal abscess on 8/14. WBC 11.8, afebrile. Pharmacy has been consulted for Vancomycin dosing.   Plan: Day 5 of antibiotics. Will adjust Vancomycin from 750 mg IV Q12H to 1000 mg IV Q24H. Goal AUC 400-550. Expected AUC: 500.5 Expected Css min: 9.7 SCr used: 0.8 (actual 0.45)   Height: 5' (152.4 cm) Weight: 45.4 kg (100 lb) IBW/kg (Calculated) : 45.5  Temp (24hrs), Avg:98.1 F (36.7 C), Min:97.6 F (36.4 C), Max:98.7 F (37.1 C)  Recent Labs  Lab 05/27/22 0225 05/27/22 0442 05/27/22 0629 05/27/22 0822 05/28/22 0420 05/29/22 0301 05/30/22 0328 05/31/22 0605  WBC  --   --  10.7*  --  10.5 11.6* 14.5* 11.8*  CREATININE  --   --  0.53  --  0.53 0.49 0.57 0.45  LATICACIDVEN 1.9 2.6*  --  1.6  --   --   --   --      Estimated Creatinine Clearance: 68.3 mL/min (by C-G formula based on SCr of 0.45 mg/dL).    Allergies  Allergen Reactions   Codeine Anaphylaxis and Nausea And Vomiting   Penicillins Rash    As a teenager   Tramadol Itching    Antimicrobials this admission: Ceftriaxone 8/11 >>  Vancomcyin 8/11 >>   Microbiology results: 8/10 Ucx; E.coli (R-cipro) 8/11 Bcx: NH4D 8/11 MRSA PCR: negative 8/14 Wcx: rare GPC  Thank you for allowing pharmacy to be a part of this patient's care.  Celene Squibb, PharmD PGY1 Pharmacy Resident 05/31/2022 8:27 AM

## 2022-05-31 NOTE — Plan of Care (Signed)
  Problem: Coping: Goal: Ability to adjust to condition or change in health will improve Outcome: Progressing   Problem: Fluid Volume: Goal: Ability to maintain a balanced intake and output will improve Outcome: Progressing   Problem: Health Behavior/Discharge Planning: Goal: Ability to identify and utilize available resources and services will improve Outcome: Progressing   Problem: Health Behavior/Discharge Planning: Goal: Ability to manage health-related needs will improve Outcome: Progressing   Problem: Metabolic: Goal: Ability to maintain appropriate glucose levels will improve Outcome: Progressing   Problem: Nutritional: Goal: Maintenance of adequate nutrition will improve Outcome: Progressing   Problem: Nutritional: Goal: Progress toward achieving an optimal weight will improve Outcome: Progressing   Problem: Skin Integrity: Goal: Risk for impaired skin integrity will decrease Outcome: Progressing   Problem: Education: Goal: Knowledge of General Education information will improve Description: Including pain rating scale, medication(s)/side effects and non-pharmacologic comfort measures Outcome: Progressing   Problem: Health Behavior/Discharge Planning: Goal: Ability to manage health-related needs will improve Outcome: Progressing   Problem: Clinical Measurements: Goal: Ability to maintain clinical measurements within normal limits will improve Outcome: Progressing   Problem: Clinical Measurements: Goal: Will remain free from infection Outcome: Progressing   Problem: Clinical Measurements: Goal: Diagnostic test results will improve Outcome: Progressing   Problem: Clinical Measurements: Goal: Cardiovascular complication will be avoided Outcome: Progressing   Problem: Activity: Goal: Risk for activity intolerance will decrease Outcome: Progressing   Problem: Nutrition: Goal: Adequate nutrition will be maintained Outcome: Progressing   Problem:  Coping: Goal: Level of anxiety will decrease Outcome: Progressing   Problem: Elimination: Goal: Will not experience complications related to urinary retention Outcome: Progressing   Problem: Pain Managment: Goal: General experience of comfort will improve Outcome: Progressing   Problem: Safety: Goal: Ability to remain free from injury will improve Outcome: Progressing   Problem: Skin Integrity: Goal: Risk for impaired skin integrity will decrease Outcome: Progressing

## 2022-05-31 NOTE — Assessment & Plan Note (Signed)
Related to social/environmental circumstanes, as evidence by mild to moderate fat and muscle depletion. --Appreciate RD recommendations --Started on MVI, Ensure Max, vit C, zinc

## 2022-05-31 NOTE — TOC Progression Note (Signed)
Transition of Care [facility]) - Progression Note    Patient Details  Name: [name] MRN: [id number] Date of Birth: April 08, 1986  Transition of Care [facility] CM/SW Contact  Marlowe Sax, RN Phone Number: 05/31/2022, 8:52 AM  Clinical Narrative:     TOC to continue to Monitor Continues on IV ABX, Penrose Drain in place Still no identified TOC needs       Expected Discharge Plan and Services                                                 Social Determinants of Health (SDOH) Interventions    Readmission Risk Interventions    03/11/2021    3:39 PM  Readmission Risk Prevention Plan  Transportation Screening Complete  PCP or Specialist Appt within 3-5 Days Complete  HRI or Home Care Consult Complete  Social Work Consult for Recovery Care Planning/Counseling Complete  Palliative Care Screening Not Applicable  Medication Review [employer]) Complete

## 2022-06-01 DIAGNOSIS — L0231 Cutaneous abscess of buttock: Secondary | ICD-10-CM | POA: Diagnosis not present

## 2022-06-01 LAB — CULTURE, BLOOD (ROUTINE X 2)
Culture: NO GROWTH
Culture: NO GROWTH
Special Requests: ADEQUATE

## 2022-06-01 LAB — GLUCOSE, CAPILLARY
Glucose-Capillary: 238 mg/dL — ABNORMAL HIGH (ref 70–99)
Glucose-Capillary: 78 mg/dL (ref 70–99)

## 2022-06-01 MED ORDER — ZINC SULFATE 220 (50 ZN) MG PO CAPS: 220.0000 mg | ORAL_CAPSULE | Freq: Every day | ORAL | 0 refills | Status: AC

## 2022-06-01 MED ORDER — ENSURE MAX PROTEIN PO LIQD: 11.0000 [oz_av] | Freq: Every day | ORAL | 0 refills | Status: AC

## 2022-06-01 MED ORDER — INSULIN ASPART 100 UNIT/ML IJ SOLN: 4.0000 [IU] | Freq: Three times a day (TID) | INTRAMUSCULAR | 11 refills | Status: AC

## 2022-06-01 MED ORDER — CLINDAMYCIN HCL 300 MG PO CAPS: 300.0000 mg | ORAL_CAPSULE | Freq: Three times a day (TID) | ORAL | 0 refills | Status: AC

## 2022-06-01 MED ORDER — ASCORBIC ACID 500 MG PO TABS: 500.0000 mg | ORAL_TABLET | Freq: Two times a day (BID) | ORAL | 0 refills | Status: AC

## 2022-06-01 MED ORDER — OXYCODONE HCL 5 MG PO TABS: 5.0000 mg | ORAL_TABLET | ORAL | 0 refills | Status: AC | PRN

## 2022-06-01 MED ORDER — NICOTINE 21 MG/24HR TD PT24: 21.0000 mg | MEDICATED_PATCH | Freq: Every day | TRANSDERMAL | 0 refills | Status: AC

## 2022-06-01 MED ORDER — LANTUS SOLOSTAR 100 UNIT/ML ~~LOC~~ SOPN: 35.0000 [IU] | PEN_INJECTOR | Freq: Every day | SUBCUTANEOUS | 0 refills | Status: AC

## 2022-06-01 MED ORDER — ADULT MULTIVITAMIN W/MINERALS CH
1.0000 | ORAL_TABLET | Freq: Every day | ORAL | 0 refills | Status: AC
Start: 2022-06-02 — End: ?

## 2022-06-01 NOTE — Discharge Summary (Signed)
Physician Discharge Summary   Patient: [name] MRN: [id number] DOB: [birth year]  Admit date:     05/27/2022  Discharge date: 06/01/22  Discharge Physician: Fran Lowes   PCP: [facility]   Recommendations at discharge:    Discharge to home Take antibiotics until completed Change dressing as needed. Follow up with PCP in 7-10 days Follow up with General surgery in 7-10 days for removal of penrose drain.  Discharge Diagnoses: Principal Problem:   Abscess, gluteal, right Active Problems:   Acute pyelonephritis   Bilateral hydronephrosis   Chronic systolic CHF (congestive heart failure) (HCC)   Type 1 diabetes mellitus with other specified complication (HCC)   HTN (hypertension)   Tobacco abuse   Hypokalemia   Cocaine abuse (HCC)   Gartner's duct cyst   Malnutrition of moderate degree  Resolved Problems:   * No resolved hospital problems. *  Hospital Course: [name] is a 36 y.o. female with medical history significant of polysubstance abuse (cocaine, methamphetamine, benzo, tobacco), hypertension, type 1 diabetes, GERD, CHF, CKD, who presented to the ED on 05/27/2022 for evaluation of pain and swelling in the right buttock area.  He also reported dysuria and urinary frequency with lower abdominal pain and pressure in addition to bilateral flank pain.  CT scan of the abdomen pelvis showed a complex multilocular rim-enhancing septated fluid collection consistent with abscess measuring 5.0 x 3.0 x 4.8 cm at the posterior medial right buttock and upper thigh with surrounding edema extending close to the perineum.  Since UDS was positive for cocaine, surgical I&D was deferred.  Urinalysis was consistent with infection with mild bilateral hydronephrosis seen on CT concerning for pyelonephritis.  Patient was admitted to the hospital and started on empiric IV antibiotics for cellulitis and UTI/pyelonephritis.    Assessment and Plan: * Abscess, gluteal,  right Patient presented with elevated lactic acid 2.6 --> 1.6, heart rate 106, but did not meet criteria for sepsis as no other sepsis criteria present.   8/14: UDS negative, surgical I&D done 8/15: abscess Cx growing rare GPC's --General surgery consulted -- Follow cultures for ID & sensitivities -- Continue empiric vancomycin and Rocephin -- Pain control as needed -- As needed antiemetics and antipyretics   Acute pyelonephritis On IV Rocephin until abscess cultures. Urine culture grew E coli resistant only to Cipro.  Bilateral hydronephrosis CT scan negative for kidney stone or obstruction.  Renal function normal --Monitor BMP  Chronic systolic CHF (congestive heart failure) (HCC) 2D echo on 12/03/2020 showed a EF of 30 to 35%, with subsequent echo 10/31/2021 showing recovered EF of 60 to 65%.  No signs of volume overload. -- Monitor volume status  Type 1 diabetes mellitus with other specified complication (HCC) A1c 14.9, poorly controlled.   Presented with blood sugar 600, which improved to 120-130s with subcutaneous insulin.   Takes Lantus 35 unit at home. --Glargine insulin 25 units daily --Add mealtime Novolog 4 units TID --Continue sliding scale insulin --Adjust insulin for inpatient goal 140-180 --Appreciate diabetes coordinator recommendations  HTN (hypertension) Hold Coreg, lisinopril due to soft blood pressure. IV hydralazine as needed  Tobacco abuse Patient counseled on importance of smoking cessation -Nicotine patch  Hypokalemia Potassium 3.0 on admission was replaced., Magnesium 1.7 --Monitor BMP, Mg and replace K and mag as needed  Cocaine abuse [facility]) Patient counseled about importance of quitting cocaine and other substance use   Gartner's duct cyst Outpatient follow-up with OB/GYN  Malnutrition of moderate degree Related to social/environmental circumstanes, as evidence  by mild to moderate fat and muscle depletion. --Appreciate RD  recommendations --Started on MVI, Ensure Max, vit C, zinc        Consultants: General surgery Procedures performed: I& D   Disposition: Home Diet recommendation:  Discharge Diet Orders (From admission, onward)     Start     Ordered   06/01/22 0000  Diet - low sodium heart healthy        06/01/22 1008           Carb modified diet DISCHARGE MEDICATION: Allergies as of 06/01/2022       Reactions   Codeine Anaphylaxis, Nausea And Vomiting   Penicillins Rash   As a teenager   Tramadol Itching        Medication List     TAKE these medications    ascorbic acid 500 MG tablet Commonly known as: VITAMIN C Take 1 tablet (500 mg total) by mouth 2 (two) times daily.   carvedilol 6.25 MG tablet Commonly known as: COREG Take 1 tablet (6.25 mg total) by mouth 2 (two) times daily with a meal. Increased from 3.125 mg.   clindamycin 300 MG capsule Commonly known as: CLEOCIN Take 1 capsule (300 mg total) by mouth 3 (three) times daily for 10 days.   Ensure Max Protein Liqd Take 330 mLs (11 oz total) by mouth at bedtime.   Lantus SoloStar 100 UNIT/ML Solostar Pen Generic drug: insulin glargine Inject 35 Units into the skin daily.   lisinopril 5 MG tablet Commonly known as: ZESTRIL Take 1 tablet (5 mg total) by mouth daily. New medication for your heart.   multivitamin with minerals Tabs tablet Take 1 tablet by mouth daily. Start taking on: June 02, 2022   nicotine 21 mg/24hr patch Commonly known as: NICODERM CQ - dosed in mg/24 hours Place 1 patch (21 mg total) onto the skin daily. Start taking on: June 02, 2022   NovoLOG FlexPen 100 UNIT/ML FlexPen Generic drug: insulin aspart Inject 1-15 Units into the skin 3 (three) times daily with meals. Dose based on CBG and carb count, 1 unit for every 15 carb. What changed: Another medication with the same name was added. Make sure you understand how and when to take each.   insulin aspart 100 UNIT/ML  injection Commonly known as: novoLOG Inject 4 Units into the skin 3 (three) times daily with meals. What changed: You were already taking a medication with the same name, and this prescription was added. Make sure you understand how and when to take each.   omeprazole 40 MG capsule Commonly known as: PRILOSEC Take 40 mg by mouth daily.   oxyCODONE 5 MG immediate release tablet Commonly known as: Oxy IR/ROXICODONE Take 1-2 tablets (5-10 mg total) by mouth every 4 (four) hours as needed for moderate pain or severe pain.   zinc sulfate 220 (50 Zn) MG capsule Take 1 capsule (220 mg total) by mouth daily. Start taking on: June 02, 2022               Discharge Care Instructions  (From admission, onward)           Start     Ordered   06/01/22 0000  Discharge wound care:       Comments: Change dressing as needed.   06/01/22 1008            Follow-up Information     Donovan Kail, PA-C. Schedule an appointment as soon as possible for a visit on  06/14/2022.   Specialty: Physician Assistant Why: s/p right gluteal abscess I&D; has penrose at 230 Contact information: [address] [place] [address] [phone]         [facility] Follow up.   Contact information: [address] [place] [address] [phone]                Discharge Exam: Ceasar Mons Weights   05/26/22 1937  Weight: 45.4 kg   Exam:  Constitutional:  The patient is awake, alert, and oriented x 3. No acute distress. Respiratory:  No increased work of breathing. No wheezes, rales, or rhonchi No tactile fremitus Cardiovascular:  Regular rate and rhythm No murmurs, ectopy, or gallups. No lateral PMI. No thrills. Abdomen:  Abdomen is soft, non-tender, non-distended No hernias, masses, or organomegaly Normoactive bowel sounds.  Musculoskeletal:  No cyanosis, clubbing, or edema Skin:  No rashes, lesions, ulcers palpation of skin: no induration  or nodules Neurologic:  CN 2-12 intact Sensation all 4 extremities intact Psychiatric:  Mental status Mood, affect appropriate Orientation to person, place, time  judgment and insight appear intact   Condition at discharge: fair  The results of significant diagnostics from this hospitalization (including imaging, microbiology, ancillary and laboratory) are listed below for reference.   Imaging Studies: CT ABDOMEN PELVIS W CONTRAST  Result Date: 05/27/2022 CLINICAL DATA:  Right lower quadrant pain and gluteal abscess. EXAM: CT ABDOMEN AND PELVIS WITH CONTRAST TECHNIQUE: Multidetector CT imaging of the abdomen and pelvis was performed using the standard protocol following bolus administration of intravenous contrast. RADIATION DOSE REDUCTION: This exam was performed according to the departmental dose-optimization program which includes automated exposure control, adjustment of the mA and/or kV according to patient size and/or use of iterative reconstruction technique. CONTRAST:  34mL OMNIPAQUE IOHEXOL 300 MG/ML  SOLN COMPARISON:  Abdomen and pelvis CT 11/28/2020. FINDINGS: Lower chest: Interval resolution of prior right pleural effusion and right lower lobe consolidations. There is mild posterior atelectasis in the lower lobes. Lung bases are otherwise clear. The cardiac size is normal. There is no pericardial effusion. Hepatobiliary: The liver mildly steatotic and measures 17 cm length. The gallbladder is contracted and not well demonstrated but no obvious stones or inflammatory change noted and no biliary dilatation is seen. Pancreas: No focal abnormality. Spleen: No focal abnormality.  No splenomegaly. Adrenals/Urinary Tract: There is no adrenal mass. There is mild bilateral hydronephrosis without evidence of significant ureterectasis, stones or bladder thickening or intravesical stones. Stomach/Bowel: Mild gastric fluid distention is again seen but no outlet obstructing mass. There are thickened  folds in the duodenum and jejunum. There is no small bowel obstruction or inflammatory change. The appendix is obscured distal half by overlapping structures. The proximal appendix is unremarkable. Moderate stool retention is seen without evidence of colitis or diverticulitis. Vascular/Lymphatic: Aortic atherosclerosis. No enlarged abdominal or pelvic lymph nodes. 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 Reproductive: The uterus is intact. The ovaries are not enlarged. Left paravaginal cyst near the pelvic floor is again noted, today measuring 2.0 x 1.4 cm on 2:78, previously 1.9 x 1.1 cm. Other: At the junction of the lower medial right buttock and upper posterior thigh, there is a complex multilocular multiseptated rim enhancing collection measuring 5.0 x 3.0 x 4.8 cm with surrounding edema consistent with cellulitis. This is most consistent with a viscous abscess or phlegmon forming into an abscess. Internal Hounsfield density ranges 40-50 units. There  is no incarcerated hernia. There is no free air, free hemorrhage or free fluid. Musculoskeletal: No acute or significant osseous findings. IMPRESSION: 1. 5.0 x 3.0 x 4.8 cm complex multilocular rim enhancing multi-septated collection subcutaneously at the junction of the posteromedial right buttock and posterior upper thigh, surrounding edema consistent with cellulitis. The edema does not grossly extend into the perineal planes but it is very close to the perineum. Internal density is above the typical density of fluid therefore this could be a very viscous abscess or a phlegmon forming into an abscess. 2. 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 3. Constipation. 4. Gastroenteritis. 5. Mild hydronephrosis without hydroureter or stone disease. No significant bladder dilatation. 6. Mildly steatotic liver. 7. Aortic atherosclerosis. 8.  Chronic severe celiac artery origin stenosis. Electronically Signed   By: Almira Bar M.D.   On: 05/27/2022 03:22    Microbiology: Results for orders placed or performed during the hospital encounter of 05/27/22  Urine Culture     Status: Abnormal   Collection Time: 05/26/22  7:43 PM   Specimen: Urine, Random  Result Value Ref Range Status   Specimen Description   Final    URINE, RANDOM Performed at [facility], [address] [place], [place] [address]    Special Requests   Final    NONE Performed at [facility], [address] [place], [place] [address]    Culture >=100,000 COLONIES/mL ESCHERICHIA COLI (A)  Final   Report Status 05/29/2022 FINAL  Final   Organism ID, Bacteria ESCHERICHIA COLI (A)  Final      Susceptibility   Escherichia coli - MIC*    AMPICILLIN <=2 SENSITIVE Sensitive     CEFAZOLIN <=4 SENSITIVE Sensitive     CEFEPIME <=0.12 SENSITIVE Sensitive     CEFTRIAXONE <=0.25 SENSITIVE Sensitive     CIPROFLOXACIN >=4 RESISTANT Resistant     GENTAMICIN <=1 SENSITIVE Sensitive     IMIPENEM <=0.25 SENSITIVE Sensitive     NITROFURANTOIN <=16 SENSITIVE Sensitive     TRIMETH/SULFA <=20 SENSITIVE Sensitive     AMPICILLIN/SULBACTAM <=2 SENSITIVE Sensitive     PIP/TAZO <=4 SENSITIVE Sensitive     * >=100,000 COLONIES/mL ESCHERICHIA COLI  Blood Culture (routine x 2)     Status: None   Collection Time: 05/27/22  2:25 AM   Specimen: BLOOD  Result Value Ref Range Status   Specimen Description BLOOD LEFT ASSIST CONTROL  Final   Special Requests   Final    BOTTLES DRAWN AEROBIC AND ANAEROBIC Blood Culture adequate volume   Culture   Final    NO GROWTH 5 DAYS Performed at [facility], [address] [place], [place] [address]    Report Status 06/01/2022 FINAL  Final  Blood Culture (routine x 2)     Status: None   Collection Time: 05/27/22  2:25 AM   Specimen: BLOOD  Result Value Ref Range Status   Specimen Description BLOOD RIGHT  FOREARM  Final   Special Requests   Final    BOTTLES DRAWN AEROBIC AND ANAEROBIC Blood Culture results may not be optimal due to an inadequate volume of blood received in culture bottles   Culture   Final    NO GROWTH 5 DAYS Performed at [facility], [address], [place], [place] [address]    Report Status 06/01/2022 FINAL  Final  MRSA Next Gen by PCR, Nasal     Status: None   Collection Time:  05/27/22  2:25 AM   Specimen: Nasal Mucosa; Nasal Swab  Result Value Ref Range Status   MRSA by PCR Next Gen NOT DETECTED NOT DETECTED Final    Comment: (NOTE) The GeneXpert MRSA Assay (FDA approved for NASAL specimens only), is one component of a comprehensive MRSA colonization surveillance program. It is not intended to diagnose MRSA infection nor to guide or monitor treatment for MRSA infections. Test performance is not FDA approved in patients less than 27 years old. Performed at [facility], [address] [place], [place] [address]   Aerobic/Anaerobic Culture w Gram Stain (surgical/deep wound)     Status: None (Preliminary result)   Collection Time: 05/30/22  9:03 AM   Specimen: PATH Other; Tissue  Result Value Ref Range Status   Specimen Description   Final    ABSCESS Performed at [facility], [address] [place], [place] [address]    Special Requests   Final    NONE Performed at [facility], [address] [place], [place] [address]    Gram Stain   Final    NO WBC SEEN RARE GRAM POSITIVE COCCI Performed at [facility], [address] [place], [place] [address]    Culture   Final    FEW METHICILLIN RESISTANT STAPHYLOCOCCUS AUREUS NO ANAEROBES ISOLATED; CULTURE IN PROGRESS FOR 5 DAYS    Report Status PENDING  Incomplete   Organism ID, Bacteria METHICILLIN RESISTANT STAPHYLOCOCCUS AUREUS  Final      Susceptibility   Methicillin resistant staphylococcus aureus - MIC*    CIPROFLOXACIN >=8 RESISTANT Resistant      ERYTHROMYCIN >=8 RESISTANT Resistant     GENTAMICIN <=0.5 SENSITIVE Sensitive     OXACILLIN >=4 RESISTANT Resistant     TETRACYCLINE <=1 SENSITIVE Sensitive     VANCOMYCIN <=0.5 SENSITIVE Sensitive     TRIMETH/SULFA <=10 SENSITIVE Sensitive     CLINDAMYCIN <=0.25 SENSITIVE Sensitive     RIFAMPIN <=0.5 SENSITIVE Sensitive     Inducible Clindamycin NEGATIVE Sensitive     * FEW METHICILLIN RESISTANT STAPHYLOCOCCUS AUREUS    Labs: CBC: Recent Labs  Lab 05/27/22 0629 05/28/22 0420 05/29/22 0301 05/30/22 0328 05/31/22 0605  WBC 10.7* 10.5 11.6* 14.5* 11.8*  HGB 11.2* 11.7* 10.7* 10.6* 10.1*  HCT 33.5* 36.0 32.9* 32.4* 31.3*  MCV 84.4 85.9 85.7 85.7 86.2  PLT 187 176 158 187 283   Basic Metabolic Panel: Recent Labs  Lab 05/26/22 1942 05/27/22 0225 05/27/22 0629 05/28/22 0420 05/29/22 0301 05/30/22 0328 05/31/22 0605  NA 129*  --  134* 134* 135  --  136  K 4.7  --  3.0* 4.4 3.9  --  4.5  CL 89*  --  100 103 103  --  105  CO2 29  --  31 27 29   --  27  GLUCOSE 663*  --  138* 241* 120*  --  289*  BUN 34*  --  19 15 14   --  19  CREATININE 0.98  --  0.53 0.53 0.49 0.57 0.45  CALCIUM 8.9  --  7.8* 7.8* 7.8*  --  7.9*  MG  --  2.2 1.7  --  1.8  --  2.0   Liver Function Tests: Recent Labs  Lab 05/27/22 0225  AST 58*  ALT 44  ALKPHOS 118  BILITOT 0.5  PROT 8.0  ALBUMIN 3.8   CBG: Recent Labs  Lab 05/31/22 1213 05/31/22 1630 05/31/22 2139 06/01/22 0907 06/01/22 1147  GLUCAP  147* 173* 190* 238* 78    Discharge time spent: greater than 30 minutes.  Signed: Pavielle Biggar, DO [facility] 06/01/2022

## 2022-06-01 NOTE — Plan of Care (Signed)

## 2022-06-01 NOTE — Progress Notes (Signed)
1142 Dr Gerri Lins aware that pt d/c orders can not be printed yet, waiting for d/c reconciliation to be completed.

## 2022-06-01 NOTE — Plan of Care (Signed)
  Problem: Skin Integrity: Goal: Risk for impaired skin integrity will decrease Outcome: Progressing   Problem: Activity: Goal: Risk for activity intolerance will decrease Outcome: Progressing   Problem: Pain Managment: Goal: General experience of comfort will improve Outcome: Progressing   

## 2022-06-01 NOTE — Progress Notes (Signed)
[facility] SURGICAL PROGRESS NOTE  Hospital Day(s): 5.   Post op day(s): 2 Days Post-Op.   Interval History:  Patient seen and examined No acute events or new complaints overnight.  Patient reports she is doing okay; sore of course No fever, chills Cx growing staph aureus; sensitivity pending; on Rocephin and Vancomycin  Vital signs in last 24 hours: [min-max] current  Temp:  [97.7 F (36.5 C)-98.8 F (37.1 C)] 98 F (36.7 C) (08/16 0416) Pulse Rate:  [65-84] 66 (08/16 0416) Resp:  [18-20] 20 (08/16 0416) BP: (122-143)/(78-88) 140/88 (08/16 0416) SpO2:  [97 %-100 %] 97 % (08/16 0416)     Height: 5' (152.4 cm) Weight: 45.4 kg BMI (Calculated): 19.53   Intake/Output last 2 shifts:  08/15 0701 - 08/16 0700 In: 240 [P.O.:240] Out: 0    Physical Exam:  Constitutional: alert, cooperative and no distress  Respiratory: breathing non-labored at rest  Cardiovascular: regular rate and sinus rhythm  Integumentary: Bedside RN at bedside to chaperone, right gluteal I&D; penrose in place, erythema resolved, indurated expectedly; no fluctuance remaining, serosanguinous drainage on dressing  Labs:     Latest Ref Rng & Units 05/31/2022    6:05 AM 05/30/2022    3:28 AM 05/29/2022    3:01 AM  CBC  WBC 4.0 - 10.5 K/uL 11.8  14.5  11.6   Hemoglobin 12.0 - 15.0 g/dL 93.7  34.2  87.6   Hematocrit 36.0 - 46.0 % 31.3  32.4  32.9   Platelets 150 - 400 K/uL 283  187  158       Latest Ref Rng & Units 05/31/2022    6:05 AM 05/30/2022    3:28 AM 05/29/2022    3:01 AM  CMP  Glucose 70 - 99 mg/dL 811   572   BUN 6 - 20 mg/dL 19   14   Creatinine 6.20 - 1.00 mg/dL 3.55  9.74  1.63   Sodium 135 - 145 mmol/L 136   135   Potassium 3.5 - 5.1 mmol/L 4.5   3.9   Chloride 98 - 111 mmol/L 105   103   CO2 22 - 32 mmol/L 27   29   Calcium 8.9 - 10.3 mg/dL 7.9   7.8     Imaging studies: No new pertinent imaging studies   Assessment/Plan: 36 y.o. female with improving leukocytosis  otherwise doing well 2 Days Post-Op s/p incision and drainage of right gluteal abscess.   - Okay to continue carb modified diet  - Continue IV Abx (Rocephin, Vancomycin); Cx with staph aureus; sensitivity pending. Would treat MRSA  - Wound Care: superficial dressings PRN; penrose in place, she will go home with this   - Pain control prn - Monitor leukocytosis; improved - Glycemic control     - Discharge Planning: Okay for DC home from surgical perspective; would treat as MRSA. Ill be happy to see her in 7 -10 days for drain removal.   All of the above findings and recommendations were discussed with the patient, and the medical team, and all of patient's questions were answered to her expressed satisfaction.  -- Lynden Oxford, PA-C [facility] 06/01/2022, 7:29 AM M-F: 7am - 4pm

## 2022-06-01 NOTE — Discharge Instructions (Signed)
In addition to included general post-operative instructions,  Wound care: Penrose will remain in place; This will be removed in 7-10 days in clinic. You may shower/get incision wet with soapy water and pat dry (do not rub incisions), but no baths or submerging incision underwater until follow-up.   Call office [phone]) at any time if any questions, worsening pain, fevers/chills, bleeding, drainage from incision site, or other concerns.

## 2022-06-04 LAB — URINE DRUGS OF ABUSE SCREEN W ALC, ROUTINE (REF LAB)
Amphetamines, Urine: NEGATIVE ng/mL
Barbiturate, Ur: NEGATIVE ng/mL
Benzodiazepine Quant, Ur: NEGATIVE ng/mL
Cannabinoid Quant, Ur: NEGATIVE ng/mL
Ethanol U, Quan: NEGATIVE %
Methadone Screen, Urine: NEGATIVE ng/mL
Opiate Quant, Ur: NEGATIVE ng/mL
Phencyclidine, Ur: NEGATIVE ng/mL
Propoxyphene, Urine: NEGATIVE ng/mL

## 2022-06-04 LAB — AEROBIC/ANAEROBIC CULTURE W GRAM STAIN (SURGICAL/DEEP WOUND): Gram Stain: NONE SEEN

## 2022-06-04 LAB — COCAINE CONF, UR
Benzoylecgonine GC/MS Conf: 283 ng/mL
Cocaine Metab Quant, Ur: POSITIVE — AB

## 2022-06-14 ENCOUNTER — Encounter: Payer: Self-pay | Admitting: Physician Assistant

## 2022-06-14 ENCOUNTER — Ambulatory Visit (INDEPENDENT_AMBULATORY_CARE_PROVIDER_SITE_OTHER): Payer: Medicaid Other | Admitting: Physician Assistant

## 2022-06-14 VITALS — BP 137/84 | HR 120 | Temp 98.3°F | Ht 60.0 in | Wt 86.6 lb

## 2022-06-14 DIAGNOSIS — L0231 Cutaneous abscess of buttock: Secondary | ICD-10-CM | POA: Diagnosis not present

## 2022-06-14 DIAGNOSIS — Z09 Encounter for follow-up examination after completed treatment for conditions other than malignant neoplasm: Secondary | ICD-10-CM

## 2022-06-14 NOTE — Patient Instructions (Signed)
Keep a dry gauze dressing over the area until it fully closes. Try to eat or drink extra protein for healing.  The GI upset should get better in a couple of days.    Follow-up with our office as needed.  Please call and ask to speak with a nurse if you develop questions or concerns.

## 2022-06-14 NOTE — Progress Notes (Signed)
[facility] POST-OP OFFICE VISIT  06/14/2022  HPI: [name] is a 36 y.o. female 15 days s/p incision and drainage of right gluteal abscess with Dr Claudine Mouton   Reports she is doing well from a wound standpoint Sh feels more worn down than usual No fever, chills Cx have grown MRSA; sent home with clindamycin and finished these No drainage  Vital signs: BP 137/84   Pulse (!) 120   Temp 98.3 F (36.8 C)   Ht 5' (1.524 m)   Wt 86 lb 9.6 oz (39.3 kg)   LMP 06/14/2022 (Exact Date)   SpO2 100%   BMI 16.91 kg/m    Physical Exam: Constitutional: Well appearing female, NAD Skin: Carlyn present as chaperone; I&D sites x2 to the right gluteal crease, penrose secured between these, there is no erythema, no induration, no wainage (Penrose removed)  Assessment/Plan: This is a 36 y.o. female 15 days s/p incision and drainage of right gluteal abscess with Dr Claudine Mouton   Lolita Cram removed without issues; dressing placed; reviewed wound care  - She would like to follow on as needed basis; She understands she can call at anytime with questions/concerns  -- Lynden Oxford, PA-C [facility] 06/14/2022, 2:50 PM M-F: 7am - 4pm

## 2022-10-06 ENCOUNTER — Encounter: Payer: Self-pay | Admitting: Emergency Medicine

## 2022-10-06 ENCOUNTER — Other Ambulatory Visit: Payer: Self-pay

## 2022-10-06 ENCOUNTER — Emergency Department
Admission: EM | Admit: 2022-10-06 | Discharge: 2022-10-06 | Discharge status: Against Medical Advice | Payer: Medicaid Other | Attending: Emergency Medicine | Admitting: Emergency Medicine

## 2022-10-06 DIAGNOSIS — I110 Hypertensive heart disease with heart failure: Secondary | ICD-10-CM | POA: Diagnosis not present

## 2022-10-06 DIAGNOSIS — R1084 Generalized abdominal pain: Secondary | ICD-10-CM | POA: Diagnosis not present

## 2022-10-06 DIAGNOSIS — Z5329 Procedure and treatment not carried out because of patient's decision for other reasons: Secondary | ICD-10-CM | POA: Insufficient documentation

## 2022-10-06 DIAGNOSIS — E109 Type 1 diabetes mellitus without complications: Secondary | ICD-10-CM | POA: Insufficient documentation

## 2022-10-06 DIAGNOSIS — R109 Unspecified abdominal pain: Secondary | ICD-10-CM | POA: Diagnosis present

## 2022-10-06 DIAGNOSIS — I509 Heart failure, unspecified: Secondary | ICD-10-CM | POA: Diagnosis not present

## 2022-10-06 DIAGNOSIS — Z8616 Personal history of COVID-19: Secondary | ICD-10-CM | POA: Insufficient documentation

## 2022-10-06 LAB — CBC
HCT: 38.6 % (ref 36.0–46.0)
Hemoglobin: 12.4 g/dL (ref 12.0–15.0)
MCH: 27.7 pg (ref 26.0–34.0)
MCHC: 32.1 g/dL (ref 30.0–36.0)
MCV: 86.2 fL (ref 80.0–100.0)
Platelets: 340 10*3/uL (ref 150–400)
RBC: 4.48 MIL/uL (ref 3.87–5.11)
RDW: 16.1 % — ABNORMAL HIGH (ref 11.5–15.5)
WBC: 14.9 10*3/uL — ABNORMAL HIGH (ref 4.0–10.5)
nRBC: 0 % (ref 0.0–0.2)

## 2022-10-06 LAB — POC URINE PREG, ED: Preg Test, Ur: NEGATIVE

## 2022-10-06 LAB — COMPREHENSIVE METABOLIC PANEL
ALT: 21 U/L (ref 0–44)
AST: 23 U/L (ref 15–41)
Albumin: 3.6 g/dL (ref 3.5–5.0)
Alkaline Phosphatase: 89 U/L (ref 38–126)
Anion gap: 9 (ref 5–15)
BUN: 25 mg/dL — ABNORMAL HIGH (ref 6–20)
CO2: 32 mmol/L (ref 22–32)
Calcium: 9.5 mg/dL (ref 8.9–10.3)
Chloride: 94 mmol/L — ABNORMAL LOW (ref 98–111)
Creatinine, Ser: 0.65 mg/dL (ref 0.44–1.00)
GFR, Estimated: >60 mL/min (ref >60)
Glucose, Bld: 222 mg/dL — ABNORMAL HIGH (ref 70–99)
Potassium: 4 mmol/L (ref 3.5–5.1)
Sodium: 135 mmol/L (ref 135–145)
Total Bilirubin: 0.7 mg/dL (ref 0.3–1.2)
Total Protein: 7.6 g/dL (ref 6.5–8.1)

## 2022-10-06 LAB — URINALYSIS, ROUTINE W REFLEX MICROSCOPIC
Bilirubin Urine: NEGATIVE
Glucose, UA: >=500 mg/dL — AB
Hgb urine dipstick: NEGATIVE
Ketones, ur: NEGATIVE mg/dL
Nitrite: NEGATIVE
Protein, ur: >=300 mg/dL — AB
Specific Gravity, Urine: 1.014 (ref 1.005–1.030)
pH: 8 (ref 5.0–8.0)

## 2022-10-06 LAB — LIPASE, BLOOD: Lipase: 85 U/L — ABNORMAL HIGH (ref 11–51)

## 2022-10-06 LAB — CBG MONITORING, ED: Glucose-Capillary: 227 mg/dL — ABNORMAL HIGH (ref 70–99)

## 2022-10-06 MED ORDER — SODIUM CHLORIDE 0.9 % IV BOLUS: 1000.0000 mL | Freq: Once | INTRAVENOUS | Status: DC

## 2022-10-06 NOTE — ED Triage Notes (Signed)
Patient arrives ambulatory c/o left pinky swelling and discoloration. Patient also reports over the past 3 days having burning sensation in abdomen after eating or drinking.

## 2022-10-06 NOTE — ED Provider Notes (Signed)
[facility] Provider Note    Event Date/Time   First MD Initiated Contact with Patient 10/06/22 1038     (approximate)   History   Hand Pain and Abdominal Pain   HPI  Daana Rogus is a 36 y.o. female   presents to the ED with complaint of left fifth finger swelling and discoloration with no history of injury.  Patient also is here due to burning sensation in her abdomen after eating and drinking stating that she vomits when she put something in her mouth.  This has been going on for 3 days.  She denies any fever, chills or diarrhea.  She has had this problem in the past and is actually seen a gastroenterologist and was seen in the emergency department at [facility].  She states that the omeprazole that she was prescribed did not help with her symptoms.  She also reports that she has not had a bowel movement in the last 4 days.  In the past she has taken Zetia but discontinued this year ago.  Patient is type I diabetic.  Hospitalized in the past for DKA, COVID, CHF, hypertension, urinary tract infections, polysubstance abuse, panic disorder, pneumonia, acute pyelonephritis, and hypokalemia.  Patient denies use of alcohol at this time.      Physical Exam   Triage Vital Signs: ED Triage Vitals  Enc Vitals Group     BP 10/06/22 1007 (!) 146/104     Pulse Rate 10/06/22 1007 93     Resp 10/06/22 1007 18     Temp 10/06/22 1007 98.3 F (36.8 C)     Temp Source 10/06/22 1007 Oral     SpO2 10/06/22 1007 100 %     Weight 10/06/22 1007 106 lb (48.1 kg)     Height 10/06/22 1007 5' (1.524 m)     Head Circumference --      Peak Flow --      Pain Score 10/06/22 1013 9     Pain Loc --      Pain Edu? --      Excl. in [place]? --     Most recent vital signs: Vitals:   10/06/22 1007  BP: (!) 146/104  Pulse: 93  Resp: 18  Temp: 98.3 F (36.8 C)  SpO2: 100%     General: Awake, no distress.  Ambulatory without any assistance. CV:  Good peripheral perfusion.   Resp:  Normal effort.  Lungs are clear bilaterally. Abd:  Mild distention, generalized abdominal tenderness to palpation. Other:  No localized abscess is noted on examination of the fifth digit.   ED Results / Procedures / Treatments   Labs (all labs ordered are listed, but only abnormal results are displayed) Labs Reviewed  LIPASE, BLOOD - Abnormal; Notable for the following components:      Result Value   Lipase 85 (*)    All other components within normal limits  COMPREHENSIVE METABOLIC PANEL - Abnormal; Notable for the following components:   Chloride 94 (*)    Glucose, Bld 222 (*)    BUN 25 (*)    All other components within normal limits  CBC - Abnormal; Notable for the following components:   WBC 14.9 (*)    RDW 16.1 (*)    All other components within normal limits  URINALYSIS, ROUTINE W REFLEX MICROSCOPIC - Abnormal; Notable for the following components:   Color, Urine YELLOW (*)    APPearance CLOUDY (*)    Glucose, UA >=500 (*)  Protein, ur >=300 (*)    Leukocytes,Ua MODERATE (*)    Bacteria, UA MANY (*)    All other components within normal limits  CBG MONITORING, ED - Abnormal; Notable for the following components:   Glucose-Capillary 227 (*)    All other components within normal limits  POC URINE PREG, ED     RADIOLOGY CT abdomen pending    PROCEDURES:  Critical Care performed:   Procedures   MEDICATIONS ORDERED IN ED: Medications  sodium chloride 0.9 % bolus 1,000 mL (has no administration in time range)     IMPRESSION / MDM / ASSESSMENT AND PLAN / ED COURSE  I reviewed the triage vital signs and the nursing notes.   Differential diagnosis includes, but is not limited to, generalized abdominal pain recurrent, gastritis, esophagitis, cholecystitis, cholelithiasis, pancreatitis, peptic ulcer disease, urinary tract infection.  ----------------------------------------- 1:51 PM on 10/06/2022 ----------------------------------------- Nursing  staff is unable to find patient to start an IV for her CT with contrast.  Patient not in room or in restrooms and pod D.  ----------------------------------------- 2:08 PM on 10/06/2022 ----------------------------------------- Patient is not found in the lobby.  ----------------------------------------- 2:15 PM on 10/06/2022 ----------------------------------------- Patient is still nowhere to be found by myself or nursing staff.  36 year old female presents to the ED with complaint of generalized abdominal pain recurrent.  Patient states anytime she puts anything in her mouth she vomits.  She denies any fever, chills, diarrhea.  She states that she was seen in the emergency department at [facility] in August and was referred to a gastroenterologist.  She states that she saw 1 in [facility] who put her on omeprazole which did not help with any of her symptoms.  Physical exam showed a mildly distended abdomen with active bowel sounds.  Patient was actively roaming through the department and then walking out to her car to get a Magazine features editor for her phone.  She also came and asked to use a portable phone and was walking in the hallway.  IV was never started as nurses were never able to catch the patient in the room and CT scan is being canceled.  Patient eloped from the examination room with a history of recurrent generalized abdominal pain.    Patient's presentation is most consistent with acute complicated illness / injury requiring diagnostic workup.  FINAL CLINICAL IMPRESSION(S) / ED DIAGNOSES   Final diagnoses:  Generalized abdominal pain     Rx / DC Orders   ED Discharge Orders     None        Note:  This document was prepared using Dragon voice recognition software and may include unintentional dictation errors.   Tommi Rumps, PA-C 10/06/22 1422    Pilar Jarvis, MD 10/07/22 [phone]

## 2022-10-06 NOTE — ED Notes (Signed)
Pt still not in room   Provider aware

## 2022-10-06 NOTE — ED Notes (Signed)
Pt not in room.

## 2022-10-06 NOTE — ED Notes (Signed)
Pt not in room, some belongings in room, provider aware.

## 2022-11-12 ENCOUNTER — Emergency Department (HOSPITAL_COMMUNITY): Payer: Medicaid Other

## 2022-11-12 ENCOUNTER — Inpatient Hospital Stay (HOSPITAL_COMMUNITY)
Admission: EM | Admit: 2022-11-12 | Discharge: 2022-11-17 | DRG: 637 | Disposition: A | Discharge status: Home / Self Care | Payer: Medicaid Other | Attending: Student | Admitting: Student

## 2022-11-12 ENCOUNTER — Other Ambulatory Visit: Payer: Self-pay

## 2022-11-12 DIAGNOSIS — I5022 Chronic systolic (congestive) heart failure: Secondary | ICD-10-CM | POA: Diagnosis present

## 2022-11-12 DIAGNOSIS — I110 Hypertensive heart disease with heart failure: Secondary | ICD-10-CM | POA: Diagnosis present

## 2022-11-12 DIAGNOSIS — G928 Other toxic encephalopathy: Secondary | ICD-10-CM | POA: Diagnosis present

## 2022-11-12 DIAGNOSIS — E1011 Type 1 diabetes mellitus with ketoacidosis with coma: Secondary | ICD-10-CM | POA: Diagnosis not present

## 2022-11-12 DIAGNOSIS — J019 Acute sinusitis, unspecified: Secondary | ICD-10-CM | POA: Diagnosis present

## 2022-11-12 DIAGNOSIS — K5900 Constipation, unspecified: Secondary | ICD-10-CM | POA: Diagnosis not present

## 2022-11-12 DIAGNOSIS — F1490 Cocaine use, unspecified, uncomplicated: Secondary | ICD-10-CM

## 2022-11-12 DIAGNOSIS — T68XXXA Hypothermia, initial encounter: Secondary | ICD-10-CM

## 2022-11-12 DIAGNOSIS — J9601 Acute respiratory failure with hypoxia: Secondary | ICD-10-CM | POA: Diagnosis not present

## 2022-11-12 DIAGNOSIS — E861 Hypovolemia: Secondary | ICD-10-CM | POA: Diagnosis present

## 2022-11-12 DIAGNOSIS — E43 Unspecified severe protein-calorie malnutrition: Secondary | ICD-10-CM | POA: Diagnosis present

## 2022-11-12 DIAGNOSIS — Z681 Body mass index (BMI) 19 or less, adult: Secondary | ICD-10-CM | POA: Diagnosis not present

## 2022-11-12 DIAGNOSIS — G9341 Metabolic encephalopathy: Secondary | ICD-10-CM

## 2022-11-12 DIAGNOSIS — Z794 Long term (current) use of insulin: Secondary | ICD-10-CM

## 2022-11-12 DIAGNOSIS — E1010 Type 1 diabetes mellitus with ketoacidosis without coma: Principal | ICD-10-CM | POA: Diagnosis present

## 2022-11-12 DIAGNOSIS — Z880 Allergy status to penicillin: Secondary | ICD-10-CM

## 2022-11-12 DIAGNOSIS — R680 Hypothermia, not associated with low environmental temperature: Secondary | ICD-10-CM | POA: Diagnosis present

## 2022-11-12 DIAGNOSIS — R739 Hyperglycemia, unspecified: Secondary | ICD-10-CM | POA: Diagnosis not present

## 2022-11-12 DIAGNOSIS — R451 Restlessness and agitation: Secondary | ICD-10-CM | POA: Diagnosis not present

## 2022-11-12 DIAGNOSIS — I959 Hypotension, unspecified: Secondary | ICD-10-CM | POA: Diagnosis present

## 2022-11-12 DIAGNOSIS — E10649 Type 1 diabetes mellitus with hypoglycemia without coma: Secondary | ICD-10-CM | POA: Diagnosis not present

## 2022-11-12 DIAGNOSIS — E875 Hyperkalemia: Secondary | ICD-10-CM | POA: Diagnosis present

## 2022-11-12 DIAGNOSIS — G9340 Encephalopathy, unspecified: Secondary | ICD-10-CM | POA: Diagnosis not present

## 2022-11-12 DIAGNOSIS — F1410 Cocaine abuse, uncomplicated: Secondary | ICD-10-CM | POA: Diagnosis present

## 2022-11-12 DIAGNOSIS — D649 Anemia, unspecified: Secondary | ICD-10-CM | POA: Diagnosis present

## 2022-11-12 DIAGNOSIS — J690 Pneumonitis due to inhalation of food and vomit: Secondary | ICD-10-CM | POA: Diagnosis present

## 2022-11-12 DIAGNOSIS — N179 Acute kidney failure, unspecified: Secondary | ICD-10-CM | POA: Diagnosis present

## 2022-11-12 DIAGNOSIS — E876 Hypokalemia: Secondary | ICD-10-CM | POA: Diagnosis present

## 2022-11-12 DIAGNOSIS — J189 Pneumonia, unspecified organism: Secondary | ICD-10-CM | POA: Diagnosis not present

## 2022-11-12 DIAGNOSIS — Z91148 Patient's other noncompliance with medication regimen for other reason: Secondary | ICD-10-CM

## 2022-11-12 DIAGNOSIS — Z885 Allergy status to narcotic agent status: Secondary | ICD-10-CM | POA: Diagnosis not present

## 2022-11-12 DIAGNOSIS — Z781 Physical restraint status: Secondary | ICD-10-CM | POA: Diagnosis not present

## 2022-11-12 DIAGNOSIS — E870 Hyperosmolality and hypernatremia: Secondary | ICD-10-CM | POA: Diagnosis present

## 2022-11-12 DIAGNOSIS — B9689 Other specified bacterial agents as the cause of diseases classified elsewhere: Secondary | ICD-10-CM | POA: Diagnosis not present

## 2022-11-12 LAB — I-STAT CHEM 8, ED
BUN: 80 mg/dL — ABNORMAL HIGH (ref 6–20)
Calcium, Ion: 1.08 mmol/L — ABNORMAL LOW (ref 1.15–1.40)
Chloride: 94 mmol/L — ABNORMAL LOW (ref 98–111)
Creatinine, Ser: 1.6 mg/dL — ABNORMAL HIGH (ref 0.44–1.00)
Glucose, Bld: >700 mg/dL (ref 70–99)
HCT: 40 % (ref 36.0–46.0)
Hemoglobin: 13.6 g/dL (ref 12.0–15.0)
Potassium: 6.7 mmol/L (ref 3.5–5.1)
Sodium: 127 mmol/L — ABNORMAL LOW (ref 135–145)
TCO2: 8 mmol/L — ABNORMAL LOW (ref 22–32)

## 2022-11-12 LAB — CBC
HCT: 43.4 % (ref 36.0–46.0)
HCT: 39.1 % (ref 36.0–46.0)
Hemoglobin: 11.9 g/dL — ABNORMAL LOW (ref 12.0–15.0)
Hemoglobin: 12.2 g/dL (ref 12.0–15.0)
MCH: 28.8 pg (ref 26.0–34.0)
MCH: 29.3 pg (ref 26.0–34.0)
MCHC: 27.4 g/dL — ABNORMAL LOW (ref 30.0–36.0)
MCHC: 31.2 g/dL (ref 30.0–36.0)
MCV: 105.1 fL — ABNORMAL HIGH (ref 80.0–100.0)
MCV: 93.8 fL (ref 80.0–100.0)
Platelets: 399 10*3/uL (ref 150–400)
Platelets: 375 10*3/uL (ref 150–400)
RBC: 4.13 MIL/uL (ref 3.87–5.11)
RBC: 4.17 MIL/uL (ref 3.87–5.11)
RDW: 16.9 % — ABNORMAL HIGH (ref 11.5–15.5)
RDW: 16.6 % — ABNORMAL HIGH (ref 11.5–15.5)
WBC: 19.9 10*3/uL — ABNORMAL HIGH (ref 4.0–10.5)
WBC: 22.7 10*3/uL — ABNORMAL HIGH (ref 4.0–10.5)
nRBC: 0 % (ref 0.0–0.2)
nRBC: 0 % (ref 0.0–0.2)

## 2022-11-12 LAB — I-STAT VENOUS BLOOD GAS, ED
Acid-base deficit: 20 mmol/L — ABNORMAL HIGH (ref 0.0–2.0)
Bicarbonate: 6.8 mmol/L — ABNORMAL LOW (ref 20.0–28.0)
Calcium, Ion: 1.18 mmol/L (ref 1.15–1.40)
HCT: 37 % (ref 36.0–46.0)
Hemoglobin: 12.6 g/dL (ref 12.0–15.0)
O2 Saturation: 97 %
Potassium: 7 mmol/L (ref 3.5–5.1)
Sodium: 127 mmol/L — ABNORMAL LOW (ref 135–145)
TCO2: 7 mmol/L — ABNORMAL LOW (ref 22–32)
pCO2, Ven: 19.3 mmHg — CL (ref 44–60)
pH, Ven: 7.158 — CL (ref 7.25–7.43)
pO2, Ven: 116 mmHg — ABNORMAL HIGH (ref 32–45)

## 2022-11-12 LAB — COMPREHENSIVE METABOLIC PANEL
ALT: 82 U/L — ABNORMAL HIGH (ref 0–44)
AST: 77 U/L — ABNORMAL HIGH (ref 15–41)
Albumin: 2.5 g/dL — ABNORMAL LOW (ref 3.5–5.0)
Alkaline Phosphatase: 166 U/L — ABNORMAL HIGH (ref 38–126)
BUN: 76 mg/dL — ABNORMAL HIGH (ref 6–20)
CO2: <7 mmol/L — ABNORMAL LOW (ref 22–32)
Calcium: 9.5 mg/dL (ref 8.9–10.3)
Chloride: 84 mmol/L — ABNORMAL LOW (ref 98–111)
Creatinine, Ser: 2.75 mg/dL — ABNORMAL HIGH (ref 0.44–1.00)
GFR, Estimated: 22 mL/min — ABNORMAL LOW (ref >60)
Glucose, Bld: >1200 mg/dL (ref 70–99)
Potassium: 6.8 mmol/L (ref 3.5–5.1)
Sodium: 131 mmol/L — ABNORMAL LOW (ref 135–145)
Total Bilirubin: 1.8 mg/dL — ABNORMAL HIGH (ref 0.3–1.2)
Total Protein: 5.7 g/dL — ABNORMAL LOW (ref 6.5–8.1)

## 2022-11-12 LAB — RAPID URINE DRUG SCREEN, HOSP PERFORMED
Amphetamines: NOT DETECTED
Barbiturates: NOT DETECTED
Benzodiazepines: NOT DETECTED
Cocaine: POSITIVE — AB
Opiates: NOT DETECTED
Tetrahydrocannabinol: NOT DETECTED

## 2022-11-12 LAB — CBG MONITORING, ED
Glucose-Capillary: >600 mg/dL (ref 70–99)
Glucose-Capillary: >600 mg/dL (ref 70–99)
Glucose-Capillary: >600 mg/dL (ref 70–99)
Glucose-Capillary: >600 mg/dL (ref 70–99)
Glucose-Capillary: >600 mg/dL (ref 70–99)
Glucose-Capillary: >600 mg/dL (ref 70–99)

## 2022-11-12 LAB — URINALYSIS, ROUTINE W REFLEX MICROSCOPIC
Bacteria, UA: NONE SEEN
Bilirubin Urine: NEGATIVE
Glucose, UA: >=500 mg/dL — AB
Hgb urine dipstick: NEGATIVE
Ketones, ur: 20 mg/dL — AB
Leukocytes,Ua: NEGATIVE
Nitrite: NEGATIVE
Protein, ur: 30 mg/dL — AB
Specific Gravity, Urine: 1.022 (ref 1.005–1.030)
pH: 5 (ref 5.0–8.0)

## 2022-11-12 LAB — BETA-HYDROXYBUTYRIC ACID: Beta-Hydroxybutyric Acid: >8 mmol/L — ABNORMAL HIGH (ref 0.05–0.27)

## 2022-11-12 LAB — LACTIC ACID, PLASMA
Lactic Acid, Venous: 3.4 mmol/L (ref 0.5–1.9)
Lactic Acid, Venous: 1.8 mmol/L (ref 0.5–1.9)

## 2022-11-12 LAB — TSH: TSH: 0.979 u[IU]/mL (ref 0.350–4.500)

## 2022-11-12 LAB — ETHANOL: Alcohol, Ethyl (B): <10 mg/dL (ref <10)

## 2022-11-12 LAB — I-STAT BETA HCG BLOOD, ED (MC, WL, AP ONLY): I-stat hCG, quantitative: 7.5 m[IU]/mL — ABNORMAL HIGH (ref <5)

## 2022-11-12 MED ORDER — LACTATED RINGERS IV BOLUS
2000.0000 mL | Freq: Once | INTRAVENOUS | Status: AC
  Administered 2022-11-12: 2000 mL via INTRAVENOUS

## 2022-11-12 MED ORDER — SODIUM CHLORIDE 0.9 % IV SOLN: INTRAVENOUS | Status: DC

## 2022-11-12 MED ORDER — VANCOMYCIN HCL IN DEXTROSE 1-5 GM/200ML-% IV SOLN
1000.0000 mg | Freq: Once | INTRAVENOUS | Status: AC
  Administered 2022-11-12: 1000 mg via INTRAVENOUS
  Filled 2022-11-12: qty 200

## 2022-11-12 MED ORDER — DEXTROSE 50 % IV SOLN
0.0000 mL | INTRAVENOUS | Status: DC | PRN
  Administered 2022-11-13 – 2022-11-14 (×2): 50 mL via INTRAVENOUS
  Filled 2022-11-12 (×2): qty 50

## 2022-11-12 MED ORDER — METRONIDAZOLE 500 MG/100ML IV SOLN
500.0000 mg | Freq: Once | INTRAVENOUS | Status: AC
  Administered 2022-11-12: 500 mg via INTRAVENOUS
  Filled 2022-11-12: qty 100

## 2022-11-12 MED ORDER — LACTATED RINGERS IV BOLUS
20.0000 mL/kg | Freq: Once | INTRAVENOUS | Status: AC
  Administered 2022-11-12: 772 mL via INTRAVENOUS

## 2022-11-12 MED ORDER — DEXTROSE IN LACTATED RINGERS 5 % IV SOLN: INTRAVENOUS | Status: DC

## 2022-11-12 MED ORDER — SODIUM CHLORIDE 0.9 % IV SOLN: 2.0000 g | Freq: Once | INTRAVENOUS | Status: DC

## 2022-11-12 MED ORDER — LACTATED RINGERS IV SOLN: INTRAVENOUS | Status: DC

## 2022-11-12 MED ORDER — HEPARIN SODIUM (PORCINE) 5000 UNIT/ML IJ SOLN
5000.0000 [IU] | Freq: Three times a day (TID) | INTRAMUSCULAR | Status: DC
  Administered 2022-11-12 – 2022-11-17 (×14): 5000 [IU] via SUBCUTANEOUS
  Filled 2022-11-12 (×14): qty 1

## 2022-11-12 MED ORDER — INSULIN REGULAR(HUMAN) IN NACL 100-0.9 UT/100ML-% IV SOLN
INTRAVENOUS | Status: DC
  Administered 2022-11-12: 3.4 [IU]/h via INTRAVENOUS
  Filled 2022-11-12: qty 100

## 2022-11-12 MED ORDER — DEXTROSE-NACL 5-0.45 % IV SOLN: INTRAVENOUS | Status: DC

## 2022-11-12 MED ORDER — SODIUM CHLORIDE 0.9 % IV SOLN
2.0000 g | INTRAVENOUS | Status: DC
  Administered 2022-11-12: 2 g via INTRAVENOUS
  Filled 2022-11-12: qty 12.5

## 2022-11-12 MED ORDER — VANCOMYCIN VARIABLE DOSE PER UNSTABLE RENAL FUNCTION (PHARMACIST DOSING): Status: DC

## 2022-11-12 MED ORDER — NALOXONE HCL 0.4 MG/ML IJ SOLN
0.4000 mg | Freq: Once | INTRAMUSCULAR | Status: AC
  Administered 2022-11-12: 0.4 mg via INTRAVENOUS
  Filled 2022-11-12: qty 1

## 2022-11-12 NOTE — Progress Notes (Signed)
Elink following for Sepsis Protocol 

## 2022-11-12 NOTE — H&P (Signed)
NAME:  [name], MRN:  [id number], DOB:  May 29, 1986, LOS: 0 ADMISSION DATE:  11/12/2022, CONSULTATION DATE:  11/12/22  REFERRING MD:  EDP, CHIEF COMPLAINT:  dka with encephalopathy   History of Present Illness:  37 yo originally, [name] was brought in by ems after roommates called reporting that she was not "acting herself" for 2 days. When ems arrives she was minimally responsive but protecting airway, gluc >600 and temperature 89.7. cth negative. Pt is unable to provide any history so all history is obtained from limited chart available.   She is warming with bair hugger, on insulin infusion and BP is improving with volume. She remains acidotic, encephalopathic (but arousable, still non verbal), hyperkalemic   Ccm was asked to admit 2/2 above.   Pertinent  Medical History  Type 1 dm Cocaine use  Significant Hospital Events: Including procedures, antibiotic start and stop dates in addition to other pertinent events   Admitted to ICU for dka with acidosis and encephalopathy  Interim History / Subjective:    Objective   Blood pressure (!) 96/56, pulse 100, temperature (!) 89.7 F (32.1 C), temperature source Rectal, resp. rate (!) 22, height 5' (1.524 m), weight 38.6 kg, SpO2 94 %.        Intake/Output Summary (Last 24 hours) at 11/12/2022 2317 Last data filed at 11/12/2022 2257 Gross per 24 hour  Intake 2200.69 ml  Output --  Net 2200.69 ml   Filed Weights   11/12/22 1937  Weight: 38.6 kg    Examination: General: arousable but not following commands, drowsy, appears ill, thin and disheveled  HENT: ncat, perrla, mm dry but pink Lungs: ctab Cardiovascular: tachycardic but reg, no m/g/t Abdomen: thin, bs+, nt nd Extremities: no c/c/e, dried dirty on feet Neuro: arousable but drifts back to sleep, spontaneous movement and cough, non verbal and not following commands.  GU: deferred  Resolved Hospital Problem list     Assessment & Plan:  Dka Type 1 dm with  hyperglycemia:  -dka protocol -monitor K -A1c pending -ivfluids -monitor mentation as arousable and protecting airway for now but high risk for intubation.   Hyperkalemia:  Metabolic acidosis:  Lactic acidosis:  -Giving volume -correction hyperglycemia -bicarb if pH drops further  Acute metabolic +/- toxic encephalopathy:  -cth negative for acute process -certainly acidotic but also positive for cocaine.   Sinusitis:  -will cont with abx, change to cetriaxone at this time.  -can de-escalate to oral abx once less encephalopathic  Cocaine use:  -per UDS -recommend cessation -possible substance abuse counseling in am.   Positive quant hcg:  -will recheck 24-48 hours  Best Practice (right click and "Reselect all SmartList Selections" daily)   Diet/type: NPO DVT prophylaxis: SCD GI prophylaxis: N/A Lines: N/A Foley:  N/A Code Status:  full code Last date of multidisciplinary goals of care discussion [tbd once able to reach family or pt less encephalopathic]  Labs   CBC: Recent Labs  Lab 11/12/22 1934 11/12/22 2016 11/12/22 2103  WBC 19.9*  --   --   HGB 11.9* 13.6 12.6  HCT 43.4 40.0 37.0  MCV 105.1*  --   --   PLT 399  --   --     Basic Metabolic Panel: Recent Labs  Lab 11/12/22 1934 11/12/22 2016 11/12/22 2103  NA 131* 127* 127*  K 6.8* 6.7* 7.0*  CL 84* 94*  --   CO2 <7*  --   --   GLUCOSE >1,200* >700*  --  BUN 76* 80*  --   CREATININE 2.75* 1.60*  --   CALCIUM 9.5  --   --    GFR: Estimated Creatinine Clearance: 29.1 mL/min (A) (by C-G formula based on SCr of 1.6 mg/dL (H)). Recent Labs  Lab 11/12/22 1934 11/12/22 2055  WBC 19.9*  --   LATICACIDVEN  --  3.4*    Liver Function Tests: Recent Labs  Lab 11/12/22 1934  AST 77*  ALT 82*  ALKPHOS 166*  BILITOT 1.8*  PROT 5.7*  ALBUMIN 2.5*   No results for input(s): "LIPASE", "AMYLASE" in the last 168 hours. No results for input(s): "AMMONIA" in the last 168 hours.  ABG     Component Value Date/Time   HCO3 6.8 (L) 11/12/2022 2103   TCO2 7 (L) 11/12/2022 2103   ACIDBASEDEF 20.0 (H) 11/12/2022 2103   O2SAT 97 11/12/2022 2103     Coagulation Profile: No results for input(s): "INR", "PROTIME" in the last 168 hours.  Cardiac Enzymes: No results for input(s): "CKTOTAL", "CKMB", "CKMBINDEX", "TROPONINI" in the last 168 hours.  HbA1C: No results found for: "HGBA1C"  CBG: Recent Labs  Lab 11/12/22 1922 11/12/22 2052 11/12/22 2152 11/12/22 2225 11/12/22 2259  GLUCAP >600* >600* >600* >600* >600*    Review of Systems:   Unobtainable as pt is not talking and drowsy but protecting airway  Past Medical History:  She,  has no past medical history on file.   Surgical History:  Unknown    Social History:     Uknown but uds + for cocaine Family History:  Her family history is not on file.   Allergies No Known Allergies   Home Medications  Prior to Admission medications   Not on File     Critical care time: 37 min cc time between 2300-2400

## 2022-11-12 NOTE — Progress Notes (Signed)
Pharmacy Antibiotic Note  [name] is a 37 y.o. female for which pharmacy has been consulted for cefepime and vancomycin dosing for sepsis.  Patient with a history of DM and substance abuse. Patient presenting with AMS.  SCr 1.6 - AKI WBC 19.9; T 89.7; HR 86; RR 15  Plan: Metronidazole per MD Cefepime 2g q24hr Vancomycin 1000 mg once, subsequent dosing as indicated per random vancomycin level until renal function stable and/or improved, at which time scheduled dosing can be considered Trend WBC, Fever, Renal function F/u cultures, clinical course, WBC De-escalate when able  Height: 5' (152.4 cm) Weight: 38.6 kg (85 lb) IBW/kg (Calculated) : 45.5  Temp (24hrs), Avg:89.7 F (32.1 C), Min:89.7 F (32.1 C), Max:89.7 F (32.1 C)  Recent Labs  Lab 11/12/22 1934 11/12/22 2016  WBC 19.9*  --   CREATININE  --  1.60*    Estimated Creatinine Clearance: 29.1 mL/min (A) (by C-G formula based on SCr of 1.6 mg/dL (H)).    No Known Allergies  Antimicrobials this admission: vancomycin 1/27 >>  flagyl 1/27 >>  cefepime 1/27 >>   Microbiology results: Pending  Thank you for allowing pharmacy to be a part of this patient's care.  Lorelei Pont, PharmD, BCPS 11/12/2022 8:52 PM ED Clinical Pharmacist -  [phone]

## 2022-11-12 NOTE — ED Provider Notes (Signed)
[place] EMERGENCY DEPARTMENT AT [facility] Provider Note   CSN: [id number] Arrival date & time: 11/12/22  1907     History  Chief Complaint  Patient presents with   Altered Mental Status    [name] is a 37 y.o. female.   Altered Mental Status    Patient presents to the ED for evaluation of altered mental status.  Patient reportedly has a history of diabetes, and substance abuse disorder.  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  According to the EMS reports roommates noted that the patient's been altered since yesterday.  EMS found the patient to be hypothermic and hypotensive.  Sugar was also very high.  She was brought to the ED for further evaluation.  Here in the ED the patient is not answering my questions.  She is unable to provide any history.  Home Medications Prior to Admission medications   Not on File      Allergies    Patient has no known allergies.    Review of Systems   Review of Systems  Physical Exam Updated Vital Signs BP (!) 92/58   Pulse 92   Temp (!) 89.7 F (32.1 C) (Rectal)   Resp 17   Ht 1.524 m (5')   Wt 38.6 kg   LMP  (LMP Unknown)   SpO2 96%   BMI 16.60 kg/m  Physical Exam Vitals and nursing note reviewed.  Constitutional:      General: She is not in acute distress.    Appearance: She is well-developed. She is ill-appearing.  HENT:     Head: Normocephalic and atraumatic.     Right Ear: External ear normal.     Left Ear: External ear normal.     Mouth/Throat:     Mouth: Mucous membranes are dry.  Eyes:     General: No scleral icterus.       Right eye: No discharge.        Left eye: No discharge.     Conjunctiva/sclera: Conjunctivae normal.     Pupils: Pupils are equal, round, and reactive to light.  Neck:     Trachea: No tracheal deviation.  Cardiovascular:     Rate and Rhythm: Normal rate and regular rhythm.  Pulmonary:     Effort: Pulmonary effort is normal. No respiratory  distress.     Breath sounds: Normal breath sounds. No stridor.  Abdominal:     General: Abdomen is flat. There is no distension.     Tenderness: There is no abdominal tenderness. There is no guarding.  Musculoskeletal:        General: No swelling or deformity.     Cervical back: Neck supple.  Skin:    General: Skin is warm and dry.     Findings: No rash.  Neurological:     Mental Status: She is lethargic.     GCS: GCS eye subscore is 1. GCS verbal subscore is 1. GCS motor subscore is 5.     Cranial Nerves: No dysarthria or facial asymmetry.     Motor: No seizure activity.     Comments: Patient responds to painful stimuli, she does not answer any questions however, closes her eyes and turns her head when I try to open her eyelids.     ED Results / Procedures / Treatments   Labs (all labs ordered are listed, but only abnormal results are displayed) Labs Reviewed  COMPREHENSIVE METABOLIC PANEL - Abnormal; Notable for  the following components:      Result Value   Sodium 131 (*)    Potassium 6.8 (*)    Chloride 84 (*)    CO2 <7 (*)    Glucose, Bld >1,200 (*)    BUN 76 (*)    Creatinine, Ser 2.75 (*)    Total Protein 5.7 (*)    Albumin 2.5 (*)    AST 77 (*)    ALT 82 (*)    Alkaline Phosphatase 166 (*)    Total Bilirubin 1.8 (*)    GFR, Estimated 22 (*)    All other components within normal limits  CBC - Abnormal; Notable for the following components:   WBC 19.9 (*)    Hemoglobin 11.9 (*)    MCV 105.1 (*)    MCHC 27.4 (*)    RDW 16.9 (*)    All other components within normal limits  URINALYSIS, ROUTINE W REFLEX MICROSCOPIC - Abnormal; Notable for the following components:   Glucose, UA >=500 (*)    Ketones, ur 20 (*)    Protein, ur 30 (*)    All other components within normal limits  RAPID URINE DRUG SCREEN, HOSP PERFORMED - Abnormal; Notable for the following components:   Cocaine POSITIVE (*)    All other components within normal limits  LACTIC ACID, PLASMA -  Abnormal; Notable for the following components:   Lactic Acid, Venous 3.4 (*)    All other components within normal limits  BETA-HYDROXYBUTYRIC ACID - Abnormal; Notable for the following components:   Beta-Hydroxybutyric Acid >8.00 (*)    All other components within normal limits  CBG MONITORING, ED - Abnormal; Notable for the following components:   Glucose-Capillary >600 (*)    All other components within normal limits  I-STAT BETA HCG BLOOD, ED (MC, [facility], AP ONLY) - Abnormal; Notable for the following components:   I-stat hCG, quantitative 7.5 (*)    All other components within normal limits  I-STAT CHEM 8, ED - Abnormal; Notable for the following components:   Sodium 127 (*)    Potassium 6.7 (*)    Chloride 94 (*)    BUN 80 (*)    Creatinine, Ser 1.60 (*)    Glucose, Bld >700 (*)    Calcium, Ion 1.08 (*)    TCO2 8 (*)    All other components within normal limits  I-STAT VENOUS BLOOD GAS, ED - Abnormal; Notable for the following components:   pH, Ven 7.158 (*)    pCO2, Ven 19.3 (*)    pO2, Ven 116 (*)    Bicarbonate 6.8 (*)    TCO2 7 (*)    Acid-base deficit 20.0 (*)    Sodium 127 (*)    Potassium 7.0 (*)    All other components within normal limits  CBG MONITORING, ED - Abnormal; Notable for the following components:   Glucose-Capillary >600 (*)    All other components within normal limits  CBG MONITORING, ED - Abnormal; Notable for the following components:   Glucose-Capillary >600 (*)    All other components within normal limits  CULTURE, BLOOD (ROUTINE X 2)  CULTURE, BLOOD (ROUTINE X 2)  ETHANOL  LACTIC ACID, PLASMA  OSMOLALITY  TSH    EKG EKG Interpretation  Date/Time:  Saturday November 12 2022 19:21:33 EST Ventricular Rate:  77 PR Interval:  160 QRS Duration: 116 QT Interval:  467 QTC Calculation: 529 R Axis:   86 Text Interpretation: Sinus rhythm Biatrial enlargement Incomplete right bundle branch  block Left ventricular hypertrophy Prolonged QT interval  no prior tracing Confirmed by Dorie Rank [phone]) on 11/12/2022 8:36:31 PM  Radiology CT Head Wo Contrast  Result Date: 11/12/2022 CLINICAL DATA:  Altered mental status. EXAM: CT HEAD WITHOUT CONTRAST TECHNIQUE: Contiguous axial images were obtained from the base of the skull through the vertex without intravenous contrast. RADIATION DOSE REDUCTION: This exam was performed according to the departmental dose-optimization program which includes automated exposure control, adjustment of the mA and/or kV according to patient size and/or use of iterative reconstruction technique. COMPARISON:  None Available. FINDINGS: Brain: No intracranial hemorrhage, mass effect, or midline shift. No hydrocephalus. The basilar cisterns are patent. No evidence of territorial infarct or acute ischemia. No extra-axial or intracranial fluid collection. Vascular: No hyperdense vessel or unexpected calcification. Skull: No fracture or focal lesion. Sinuses/Orbits: Mucosal thickening of left ethmoid air cells. Mucosal thickening with fluid level in the left maxillary sinus. No mastoid effusion. Other: None. IMPRESSION: 1. No acute intracranial abnormality. 2. Left maxillary and ethmoid sinus mucosal thickening with fluid level, possible acute sinusitis. Electronically Signed   By: Keith Rake M.D.   On: 11/12/2022 21:14   DG Chest Portable 1 View  Result Date: 11/12/2022 CLINICAL DATA:  Altered mental status. EXAM: PORTABLE CHEST 1 VIEW COMPARISON:  None Available. FINDINGS: The heart size and mediastinal contours are within normal limits. Both lungs are clear. The visualized skeletal structures are unremarkable. There are numerous overlying monitor wires. IMPRESSION: No active disease. Electronically Signed   By: Telford Nab M.D.   On: 11/12/2022 20:05    Procedures Procedures    Medications Ordered in ED Medications  insulin regular, human (MYXREDLIN) 100 units/ 100 mL infusion (3.4 Units/hr Intravenous Rate/Dose  Verify 11/12/22 2154)  lactated ringers infusion (has no administration in time range)  dextrose 5 % in lactated ringers infusion (has no administration in time range)  dextrose 50 % solution 0-50 mL (has no administration in time range)  metroNIDAZOLE (FLAGYL) IVPB 500 mg (500 mg Intravenous New Bag/Given 11/12/22 2151)  vancomycin (VANCOCIN) IVPB 1000 mg/200 mL premix (has no administration in time range)  vancomycin variable dose per unstable renal function (pharmacist dosing) (has no administration in time range)  ceFEPIme (MAXIPIME) 2 g in sodium chloride 0.9 % 100 mL IVPB (0 g Intravenous Stopped 11/12/22 2149)  lactated ringers infusion (has no administration in time range)  lactated ringers bolus 2,000 mL (0 mLs Intravenous Stopped 11/12/22 2101)  naloxone (NARCAN) injection 0.4 mg (0.4 mg Intravenous Given 11/12/22 2000)    ED Course/ Medical Decision Making/ A&P Clinical Course as of 11/12/22 2213  Sat Nov 12, 2022  1959 Diminished mental status.  Will monitor closely.  Currently oxygenating well and breathing easily.  Blood sugar is elevated greater than 600.  Suspect DKA or hyperosmolar syndrome [JK]  2029 Istat shows elevated potassium, bun and creatinine, bicarb decreased, c/w dka.  IV insulin infusion ordered [JK]  2042 UDS positive for cocaine.  CBC shows elevated white blood cell count 19,000 [JK]  2108 Case discussed with critical care regarding admission [JK]  2128 CT without acute intracranial abnormality.  Possible sinusitis [JK]    Clinical Course User Index [JK] Dorie Rank, MD                             Medical Decision Making Differential diagnosis includes but not limited to sepsis, hypothyroidism, DKA, drug overdose, cerebral contusion  Problems Addressed: Diabetic  ketoacidosis with coma associated with type 1 diabetes mellitus ([place]): acute illness or injury that poses a threat to life or bodily functions  Amount and/or Complexity of Data Reviewed Labs:  ordered. Decision-making details documented in ED Course. Radiology: ordered and independent interpretation performed.  Risk Prescription drug management. Decision regarding hospitalization.   Patient presented to the ED for evaluation of altered mental status.  Patient was unable to provide clear history and EMS also indicated that the people in the house did not provide a very clear history.  Patient does have history of substance abuse.  She also has a history of diabetes.  Patient's mental status was also significantly diminished on arrival although she does appear to be protecting her airway at this time.  Patient's laboratory tests are consistent with acute diabetic ketoacidosis.  She has extreme hyperglycemia with acidosis hyperkalemia and elevated beta hydroxybutyric acid.  Patient was started on IV fluid infusions.  She also has been started on an insulin drip.  Her blood pressure is improved slightly.  She has been placed on a warming blanket.  Will continue with close monitoring, serial electrolytes.  Have consulted with pulmonary critical care for admission.        Final Clinical Impression(s) / ED Diagnoses Final diagnoses:  Diabetic ketoacidosis with coma associated with type 1 diabetes mellitus ([place])  Hypothermia, initial encounter  Cocaine use    Rx / DC Orders ED Discharge Orders     None         Dorie Rank, MD 11/12/22 2213

## 2022-11-12 NOTE — ED Triage Notes (Signed)
Patient BIB [facility] c/o being altered since yesterday.  Patient's roomates report that patient has been altered since yesterday? And that she's a diabetic and dialysis patient.  No fistulas or dialysis ports noted.

## 2022-11-12 NOTE — Assessment & Plan Note (Signed)
2D echo on 12/03/2020 showed a EF of 30 to 35%, with subsequent echo 10/31/2021 showing recovered EF of 60 to 65%.  No signs of volume overload. -- Monitor volume status

## 2022-11-12 NOTE — ED Notes (Signed)
Bair hugger applied and warm LR started d/t hypothermia.

## 2022-11-12 NOTE — ED Notes (Signed)
Patient to CT.

## 2022-11-13 ENCOUNTER — Other Ambulatory Visit: Payer: Self-pay

## 2022-11-13 DIAGNOSIS — E1011 Type 1 diabetes mellitus with ketoacidosis with coma: Secondary | ICD-10-CM | POA: Diagnosis not present

## 2022-11-13 LAB — BASIC METABOLIC PANEL
Anion gap: 38 — ABNORMAL HIGH (ref 5–15)
Anion gap: 26 — ABNORMAL HIGH (ref 5–15)
Anion gap: 20 — ABNORMAL HIGH (ref 5–15)
Anion gap: 8 (ref 5–15)
Anion gap: 7 (ref 5–15)
BUN: 71 mg/dL — ABNORMAL HIGH (ref 6–20)
BUN: 60 mg/dL — ABNORMAL HIGH (ref 6–20)
BUN: 56 mg/dL — ABNORMAL HIGH (ref 6–20)
BUN: 49 mg/dL — ABNORMAL HIGH (ref 6–20)
BUN: 46 mg/dL — ABNORMAL HIGH (ref 6–20)
CO2: 8 mmol/L — ABNORMAL LOW (ref 22–32)
CO2: 15 mmol/L — ABNORMAL LOW (ref 22–32)
CO2: 20 mmol/L — ABNORMAL LOW (ref 22–32)
CO2: 30 mmol/L (ref 22–32)
CO2: 31 mmol/L (ref 22–32)
Calcium: 9.5 mg/dL (ref 8.9–10.3)
Calcium: 9.5 mg/dL (ref 8.9–10.3)
Calcium: 9.3 mg/dL (ref 8.9–10.3)
Calcium: 9 mg/dL (ref 8.9–10.3)
Calcium: 8.7 mg/dL — ABNORMAL LOW (ref 8.9–10.3)
Chloride: 92 mmol/L — ABNORMAL LOW (ref 98–111)
Chloride: 105 mmol/L (ref 98–111)
Chloride: 111 mmol/L (ref 98–111)
Chloride: 118 mmol/L — ABNORMAL HIGH (ref 98–111)
Chloride: 119 mmol/L — ABNORMAL HIGH (ref 98–111)
Creatinine, Ser: 2.54 mg/dL — ABNORMAL HIGH (ref 0.44–1.00)
Creatinine, Ser: 2.22 mg/dL — ABNORMAL HIGH (ref 0.44–1.00)
Creatinine, Ser: 1.88 mg/dL — ABNORMAL HIGH (ref 0.44–1.00)
Creatinine, Ser: 1.01 mg/dL — ABNORMAL HIGH (ref 0.44–1.00)
Creatinine, Ser: 1.01 mg/dL — ABNORMAL HIGH (ref 0.44–1.00)
GFR, Estimated: 24 mL/min — ABNORMAL LOW (ref >60)
GFR, Estimated: 28 mL/min — ABNORMAL LOW (ref >60)
GFR, Estimated: 35 mL/min — ABNORMAL LOW (ref >60)
GFR, Estimated: >60 mL/min (ref >60)
GFR, Estimated: >60 mL/min (ref >60)
Glucose, Bld: >1200 mg/dL (ref 70–99)
Glucose, Bld: 781 mg/dL (ref 70–99)
Glucose, Bld: 544 mg/dL (ref 70–99)
Glucose, Bld: 308 mg/dL — ABNORMAL HIGH (ref 70–99)
Glucose, Bld: 260 mg/dL — ABNORMAL HIGH (ref 70–99)
Potassium: 5.6 mmol/L — ABNORMAL HIGH (ref 3.5–5.1)
Potassium: 4.2 mmol/L (ref 3.5–5.1)
Potassium: 4.3 mmol/L (ref 3.5–5.1)
Potassium: 3.8 mmol/L (ref 3.5–5.1)
Potassium: 4.1 mmol/L (ref 3.5–5.1)
Sodium: 138 mmol/L (ref 135–145)
Sodium: 146 mmol/L — ABNORMAL HIGH (ref 135–145)
Sodium: 151 mmol/L — ABNORMAL HIGH (ref 135–145)
Sodium: 156 mmol/L — ABNORMAL HIGH (ref 135–145)
Sodium: 157 mmol/L — ABNORMAL HIGH (ref 135–145)

## 2022-11-13 LAB — CBG MONITORING, ED
Glucose-Capillary: 381 mg/dL — ABNORMAL HIGH (ref 70–99)
Glucose-Capillary: 449 mg/dL — ABNORMAL HIGH (ref 70–99)
Glucose-Capillary: 466 mg/dL — ABNORMAL HIGH (ref 70–99)
Glucose-Capillary: 505 mg/dL (ref 70–99)
Glucose-Capillary: 564 mg/dL (ref 70–99)
Glucose-Capillary: >600 mg/dL (ref 70–99)
Glucose-Capillary: >600 mg/dL (ref 70–99)
Glucose-Capillary: >600 mg/dL (ref 70–99)
Glucose-Capillary: >600 mg/dL (ref 70–99)
Glucose-Capillary: >600 mg/dL (ref 70–99)
Glucose-Capillary: >600 mg/dL (ref 70–99)
Glucose-Capillary: >600 mg/dL (ref 70–99)

## 2022-11-13 LAB — MRSA NEXT GEN BY PCR, NASAL: MRSA by PCR Next Gen: DETECTED — AB

## 2022-11-13 LAB — GLUCOSE, CAPILLARY
Glucose-Capillary: 152 mg/dL — ABNORMAL HIGH (ref 70–99)
Glucose-Capillary: 169 mg/dL — ABNORMAL HIGH (ref 70–99)
Glucose-Capillary: 180 mg/dL — ABNORMAL HIGH (ref 70–99)
Glucose-Capillary: 193 mg/dL — ABNORMAL HIGH (ref 70–99)
Glucose-Capillary: 198 mg/dL — ABNORMAL HIGH (ref 70–99)
Glucose-Capillary: 207 mg/dL — ABNORMAL HIGH (ref 70–99)
Glucose-Capillary: 212 mg/dL — ABNORMAL HIGH (ref 70–99)
Glucose-Capillary: 226 mg/dL — ABNORMAL HIGH (ref 70–99)
Glucose-Capillary: 23 mg/dL — CL (ref 70–99)
Glucose-Capillary: 261 mg/dL — ABNORMAL HIGH (ref 70–99)
Glucose-Capillary: 270 mg/dL — ABNORMAL HIGH (ref 70–99)
Glucose-Capillary: 44 mg/dL — CL (ref 70–99)

## 2022-11-13 LAB — BETA-HYDROXYBUTYRIC ACID
Beta-Hydroxybutyric Acid: >8 mmol/L — ABNORMAL HIGH (ref 0.05–0.27)
Beta-Hydroxybutyric Acid: 7.78 mmol/L — ABNORMAL HIGH (ref 0.05–0.27)
Beta-Hydroxybutyric Acid: 1.02 mmol/L — ABNORMAL HIGH (ref 0.05–0.27)

## 2022-11-13 LAB — HIV ANTIBODY (ROUTINE TESTING W REFLEX): HIV Screen 4th Generation wRfx: NONREACTIVE

## 2022-11-13 LAB — OSMOLALITY: Osmolality: 414 mOsm/kg (ref 275–295)

## 2022-11-13 MED ORDER — INFLUENZA VAC SPLIT QUAD 0.5 ML IM SUSY: 0.5000 mL | PREFILLED_SYRINGE | INTRAMUSCULAR | Status: DC | PRN

## 2022-11-13 MED ORDER — GERHARDT'S BUTT CREAM
TOPICAL_CREAM | Freq: Four times a day (QID) | CUTANEOUS | Status: DC
  Administered 2022-11-16 (×3): 1 via TOPICAL
  Filled 2022-11-13: qty 1

## 2022-11-13 MED ORDER — MUPIROCIN 2 % EX OINT
1.0000 | TOPICAL_OINTMENT | Freq: Two times a day (BID) | CUTANEOUS | Status: DC
  Administered 2022-11-13 – 2022-11-17 (×8): 1 via NASAL
  Filled 2022-11-13 (×2): qty 22

## 2022-11-13 MED ORDER — VANCOMYCIN HCL IN DEXTROSE 750-5 MG/150ML-% IV SOLN: 750.0000 mg | INTRAVENOUS | Status: DC

## 2022-11-13 MED ORDER — KETOROLAC TROMETHAMINE 15 MG/ML IJ SOLN
15.0000 mg | Freq: Once | INTRAMUSCULAR | Status: AC
  Administered 2022-11-13: 15 mg via INTRAVENOUS
  Filled 2022-11-13: qty 1

## 2022-11-13 MED ORDER — INSULIN DETEMIR 100 UNIT/ML ~~LOC~~ SOLN
25.0000 [IU] | Freq: Every day | SUBCUTANEOUS | Status: DC
  Administered 2022-11-13 – 2022-11-15 (×2): 25 [IU] via SUBCUTANEOUS
  Filled 2022-11-13 (×3): qty 0.25

## 2022-11-13 MED ORDER — ORAL CARE MOUTH RINSE: 15.0000 mL | OROMUCOSAL | Status: DC | PRN

## 2022-11-13 MED ORDER — SODIUM CHLORIDE 0.9 % IV SOLN
2.0000 g | Freq: Every day | INTRAVENOUS | Status: DC
  Administered 2022-11-13 – 2022-11-14 (×2): 2 g via INTRAVENOUS
  Filled 2022-11-13 (×2): qty 20

## 2022-11-13 MED ORDER — CHLORHEXIDINE GLUCONATE CLOTH 2 % EX PADS
6.0000 | MEDICATED_PAD | Freq: Every day | CUTANEOUS | Status: DC
  Administered 2022-11-13 – 2022-11-17 (×5): 6 via TOPICAL

## 2022-11-13 MED ORDER — LIP MEDEX EX OINT
1.0000 | TOPICAL_OINTMENT | CUTANEOUS | Status: DC | PRN
  Filled 2022-11-13: qty 7

## 2022-11-13 MED ORDER — INSULIN ASPART 100 UNIT/ML IJ SOLN
0.0000 [IU] | INTRAMUSCULAR | Status: DC
  Administered 2022-11-13: 3 [IU] via SUBCUTANEOUS
  Administered 2022-11-14: 5 [IU] via SUBCUTANEOUS
  Administered 2022-11-15: 3 [IU] via SUBCUTANEOUS
  Administered 2022-11-15: 8 [IU] via SUBCUTANEOUS
  Administered 2022-11-15: 3 [IU] via SUBCUTANEOUS
  Administered 2022-11-15: 2 [IU] via SUBCUTANEOUS

## 2022-11-13 MED ORDER — PNEUMOCOCCAL VAC POLYVALENT 25 MCG/0.5ML IJ INJ: 0.5000 mL | INJECTION | INTRAMUSCULAR | Status: DC | PRN

## 2022-11-13 MED ORDER — DEXMEDETOMIDINE HCL IN NACL 400 MCG/100ML IV SOLN
0.0000 ug/kg/h | INTRAVENOUS | Status: DC
  Administered 2022-11-13 – 2022-11-14 (×2): 0.4 ug/kg/h via INTRAVENOUS
  Filled 2022-11-13 (×2): qty 100

## 2022-11-13 MED ORDER — MEDIHONEY WOUND/BURN DRESSING EX PSTE
1.0000 | PASTE | Freq: Every day | CUTANEOUS | Status: DC
  Administered 2022-11-13 – 2022-11-17 (×6): 1 via TOPICAL
  Filled 2022-11-13: qty 44

## 2022-11-13 NOTE — Progress Notes (Signed)
[facility] Progress Note Patient Name: [name] DOB: [birth year] MRN: [id number]   Date of Service  11/13/2022  HPI/Events of Note  Notified of glucose at 781 from >1,200.  Insulin gtt running at 5 units/hr.  eICU Interventions  Advised RN to continue to follow endotool.     Intervention Category Intermediate Interventions: Hyperglycemia - evaluation and treatment  Elsie Lincoln 11/13/2022, 4:22 AM

## 2022-11-13 NOTE — Progress Notes (Signed)
[facility] Progress Note Patient Name: [name] DOB: Feb 17, 1986 MRN: [id number]   Date of Service  11/13/2022  HPI/Events of Note  ED:36 yr F Dx DKA Glucose > 1200, at 3.5 units of insuline drip and getting 125 ml/hr saline, on room air, per RN discussion. Do not have access to camera at this time.   eICU Interventions  - increase drip to 5 units And saline to 200 ml/hr. Will be coming to ICU soon.  Asp precautions     Intervention Category Intermediate Interventions: Hyperglycemia - evaluation and treatment  Elmer Sow 11/13/2022, 12:31 AM

## 2022-11-13 NOTE — Progress Notes (Addendum)
[facility] Progress Note Patient Name: [name] DOB: Feb 04, 1986 MRN: [id number]   Date of Service  11/13/2022  HPI/Events of Note  Received call from ED RN that patient woke up confused, pulling at lines and tubes, throwing herself over the bed railing.  Rn requesting for wrist restraints for patient's safety.   eICU Interventions  Wrist restraints ordered.  Ok to insert foley cath given AKI.      Intervention Category Intermediate Interventions: Other:  Elsie Lincoln 11/13/2022, 4:44 AM  5:00 AM Patient remains very agitated and using her legs to try to get out of the bed.   Qtc >500.   Plan> Start on precedex gtt.

## 2022-11-13 NOTE — Progress Notes (Signed)
NAME:  [name], MRN:  [id number], DOB:  1986/09/03, LOS: 1 ADMISSION DATE:  11/12/2022, CONSULTATION DATE:  11/12/2022 REFERRING MD:  Dr. Lynelle Doctor, ER, CHIEF COMPLAINT:  AMS   History of Present Illness:  37 yo female smoker found by roommate with altered mental status with agitated delirium.  Hypotensive and hypothermic (temp 89.40F) in ER.  Glucose level > 600.  UDS positive for cocaine.  Started on IV insulin, IV fluids, precedex.  PCCM consulted to manage care in ICU.  Pertinent  Medical History  DM type 1, Chronic systolic CHF with recovered EF, HTN, Cocaine abuse  Significant Hospital Events: Including procedures, antibiotic start and stop dates in addition to other pertinent events   1/27 admit, start insulin gtt, start precedex  Interim History / Subjective:  Transferred from [facility] ER to [facility] ICU.  Remains on insulin gtt, precedex.  4 point restraints on.  Objective   Blood pressure 113/66, pulse 100, temperature 98.2 F (36.8 C), temperature source Bladder, resp. rate (!) 21, height 5' (1.524 m), weight 38.6 kg, SpO2 99 %.        Intake/Output Summary (Last 24 hours) at 11/13/2022 1052 Last data filed at 11/13/2022 0848 Gross per 24 hour  Intake 4647.37 ml  Output --  Net 4647.37 ml   Filed Weights   11/12/22 1937  Weight: 38.6 kg    Examination:  General - sedated Eyes - pupils reactive ENT - no sinus tenderness, no stridor Cardiac - regular rate/rhythm, no murmur Chest - equal breath sounds b/l, no wheezing or rales Abdomen - soft, non tender, + bowel sounds Extremities - decreased muscle bulk Skin - insect bite on left buttock Neuro - RASS -2  Resolved Hospital Problem list   hyperkalemia  Assessment & Plan:   DKA with DM type 1. - continue insulin gtt - f/u beta hydroxybutyric acid level, anion gap - transition to D5 1/2 NS once CBG < 250 - f/u electrolytes  Hypotension, hypothermia. - from hypovolemia and DKA - continue  fluids  Hypernatremia. AKI from hypovolemia. - f/u BMET - monitor urine outpt  Acute toxic-metabolic encephalopathy. - from DKA, cocaine, renal failure - precedex for RASS goal 0 to -1  Acute sinusitis. - day 2 of Abx >> change to rocephin  Low positive HCG. - from 11/12/22 - repeat lab on 1/29  Hx of HTN, Chronic systolic CHF. - hold outpt meds for now  Best Practice (right click and "Reselect all SmartList Selections" daily)   Diet/type: NPO DVT prophylaxis: prophylactic heparin  GI prophylaxis: N/A Lines: N/A Foley:  N/A Code Status:  full code Last date of multidisciplinary goals of care discussion [x]   Labs       Latest Ref Rng & Units 11/13/2022    6:05 AM 11/13/2022    3:10 AM 11/12/2022   11:05 PM  CMP  Glucose 70 - 99 mg/dL 409  811  >9,147   BUN 6 - 20 mg/dL 56  60  71   Creatinine 0.44 - 1.00 mg/dL 8.29  5.62  1.30   Sodium 135 - 145 mmol/L 151  146  138   Potassium 3.5 - 5.1 mmol/L 4.3  4.2  5.6   Chloride 98 - 111 mmol/L 111  105  92   CO2 22 - 32 mmol/L 20  15  8    Calcium 8.9 - 10.3 mg/dL 9.3  9.5  9.5        Latest Ref Rng & Units 11/12/2022   11:05  PM 11/12/2022    9:03 PM 11/12/2022    8:16 PM  CBC  WBC 4.0 - 10.5 K/uL 22.7     Hemoglobin 12.0 - 15.0 g/dL 40.9  81.1  91.4   Hematocrit 36.0 - 46.0 % 39.1  37.0  40.0   Platelets 150 - 400 K/uL 375       CBG (last 3)  Recent Labs    11/13/22 0712 11/13/22 0822 11/13/22 1038  GLUCAP 449* 381* 270*    ABG    Component Value Date/Time   HCO3 6.8 (L) 11/12/2022 2103   TCO2 7 (L) 11/12/2022 2103   ACIDBASEDEF 20.0 (H) 11/12/2022 2103   O2SAT 97 11/12/2022 2103    Critical care time: 46 minutes  Coralyn Helling, MD [facility] Pager - [phone] or [phone] 11/13/2022, 11:14 AM

## 2022-11-13 NOTE — ED Notes (Addendum)
Patient now awake but extremely confused.  Patient removing purewick, attempting to remove Ivs and attempting to throw herself over the railing of the bed.  Soft wrist restraints placed for patient's immediate safety and [facility] made aware of need for order.  Awaiting call back.

## 2022-11-13 NOTE — ED Notes (Signed)
[facility]ed [facility] for transport to [facility]

## 2022-11-13 NOTE — Consult Note (Signed)
[facility] Nurse Consult Note: Reason for Consult:fissures to bilateral ands and feet, MASD to buttocks Wound type:dry skin, poor hygiene, irritant contact dermatitis Pressure Injury POA: N/A Wound bed:dry, red Drainage (amount, consistency, odor) none Periwound:dry, poor hygiene Dressing procedure/placement/frequency: I have provided Nursing with guidance for the care of the hand and heel fissures using a daily soap and water cleanse, rinse and dry followed by application of Manuka Honey. This is to be topped with moistened gauze/dry gauze/Kerlix/paper tape. Irritant contact dermatitis is to be treated with gentle cleansing followed by application of Gerhart's Butt cream.  ICD-10 CM Codes for Irritant Dermatitis  L24A2 - Due to fecal, urinary or dual incontinence L24A9 - Due to friction or contact with other specified body fluids   Turning and repositioning is in place; DermaTherapy low friction coefficient bed linen system is in use.  [facility] nursing team will not follow, but will remain available to this patient, the nursing and medical teams.  Please re-consult if needed.  Thank you for inviting [place] to participate in this patient's Plan of Care.  Maudie Flakes, MSN, RN, CNS, [place], Serita Grammes, [employer], [employer] phone:  [phone]

## 2022-11-13 NOTE — ED Notes (Signed)
Patient still continues to attempt to throw herself over the railing and use her feet to remove her foley catheter. Verbal order given by EDP for ankle restraints.

## 2022-11-13 NOTE — ED Notes (Signed)
CCMD advised to continue following endotool for patient's CBG.

## 2022-11-13 NOTE — ED Notes (Addendum)
Paged [facility], they responded. Per MD Mohan, increase NaCl to 200 mL/hr and increase insulin to 5 units/hr.

## 2022-11-13 NOTE — Progress Notes (Signed)
[facility] Progress Note Patient Name: [name] DOB: Apr 01, 1986 MRN: [id number]   Date of Service  11/13/2022  HPI/Events of Note  Pain with pain in feet, crack reported by RN Unable to take PO  eICU Interventions  Toradol x 1 ordered     Intervention Category Intermediate Interventions: Pain - evaluation and management  Montario Zilka Rodman Pickle 11/13/2022, 9:11 PM

## 2022-11-13 NOTE — ED Notes (Signed)
Bair hugger removed, warm blankets applied to patient.

## 2022-11-14 ENCOUNTER — Encounter (HOSPITAL_COMMUNITY): Payer: Self-pay | Admitting: Critical Care Medicine

## 2022-11-14 DIAGNOSIS — G9340 Encephalopathy, unspecified: Secondary | ICD-10-CM

## 2022-11-14 DIAGNOSIS — B9689 Other specified bacterial agents as the cause of diseases classified elsewhere: Secondary | ICD-10-CM | POA: Diagnosis not present

## 2022-11-14 DIAGNOSIS — G9341 Metabolic encephalopathy: Secondary | ICD-10-CM | POA: Diagnosis not present

## 2022-11-14 DIAGNOSIS — J019 Acute sinusitis, unspecified: Secondary | ICD-10-CM | POA: Diagnosis not present

## 2022-11-14 DIAGNOSIS — E43 Unspecified severe protein-calorie malnutrition: Secondary | ICD-10-CM | POA: Insufficient documentation

## 2022-11-14 DIAGNOSIS — F1490 Cocaine use, unspecified, uncomplicated: Secondary | ICD-10-CM

## 2022-11-14 LAB — BASIC METABOLIC PANEL
Anion gap: 3 — ABNORMAL LOW (ref 5–15)
Anion gap: 6 (ref 5–15)
Anion gap: 6 (ref 5–15)
Anion gap: 8 (ref 5–15)
Anion gap: 8 (ref 5–15)
BUN: 28 mg/dL — ABNORMAL HIGH (ref 6–20)
BUN: 32 mg/dL — ABNORMAL HIGH (ref 6–20)
BUN: 34 mg/dL — ABNORMAL HIGH (ref 6–20)
BUN: 35 mg/dL — ABNORMAL HIGH (ref 6–20)
BUN: 35 mg/dL — ABNORMAL HIGH (ref 6–20)
CO2: 24 mmol/L (ref 22–32)
CO2: 28 mmol/L (ref 22–32)
CO2: 29 mmol/L (ref 22–32)
CO2: 30 mmol/L (ref 22–32)
CO2: 31 mmol/L (ref 22–32)
Calcium: 7.3 mg/dL — ABNORMAL LOW (ref 8.9–10.3)
Calcium: 7.8 mg/dL — ABNORMAL LOW (ref 8.9–10.3)
Calcium: 8.2 mg/dL — ABNORMAL LOW (ref 8.9–10.3)
Calcium: 8.4 mg/dL — ABNORMAL LOW (ref 8.9–10.3)
Calcium: 8.5 mg/dL — ABNORMAL LOW (ref 8.9–10.3)
Chloride: 111 mmol/L (ref 98–111)
Chloride: 118 mmol/L — ABNORMAL HIGH (ref 98–111)
Chloride: 119 mmol/L — ABNORMAL HIGH (ref 98–111)
Chloride: 121 mmol/L — ABNORMAL HIGH (ref 98–111)
Chloride: 121 mmol/L — ABNORMAL HIGH (ref 98–111)
Creatinine, Ser: 0.67 mg/dL (ref 0.44–1.00)
Creatinine, Ser: 0.67 mg/dL (ref 0.44–1.00)
Creatinine, Ser: 0.7 mg/dL (ref 0.44–1.00)
Creatinine, Ser: 0.82 mg/dL (ref 0.44–1.00)
Creatinine, Ser: 0.85 mg/dL (ref 0.44–1.00)
GFR, Estimated: >60 mL/min (ref >60)
GFR, Estimated: >60 mL/min (ref >60)
GFR, Estimated: >60 mL/min (ref >60)
GFR, Estimated: >60 mL/min (ref >60)
GFR, Estimated: >60 mL/min (ref >60)
Glucose, Bld: 112 mg/dL — ABNORMAL HIGH (ref 70–99)
Glucose, Bld: 120 mg/dL — ABNORMAL HIGH (ref 70–99)
Glucose, Bld: 177 mg/dL — ABNORMAL HIGH (ref 70–99)
Glucose, Bld: 192 mg/dL — ABNORMAL HIGH (ref 70–99)
Glucose, Bld: 99 mg/dL (ref 70–99)
Potassium: 3.3 mmol/L — ABNORMAL LOW (ref 3.5–5.1)
Potassium: 3.5 mmol/L (ref 3.5–5.1)
Potassium: 3.9 mmol/L (ref 3.5–5.1)
Potassium: 4 mmol/L (ref 3.5–5.1)
Potassium: 4.2 mmol/L (ref 3.5–5.1)
Sodium: 143 mmol/L (ref 135–145)
Sodium: 152 mmol/L — ABNORMAL HIGH (ref 135–145)
Sodium: 153 mmol/L — ABNORMAL HIGH (ref 135–145)
Sodium: 157 mmol/L — ABNORMAL HIGH (ref 135–145)
Sodium: 158 mmol/L — ABNORMAL HIGH (ref 135–145)

## 2022-11-14 LAB — GLUCOSE, CAPILLARY
Glucose-Capillary: 101 mg/dL — ABNORMAL HIGH (ref 70–99)
Glucose-Capillary: 107 mg/dL — ABNORMAL HIGH (ref 70–99)
Glucose-Capillary: 107 mg/dL — ABNORMAL HIGH (ref 70–99)
Glucose-Capillary: 109 mg/dL — ABNORMAL HIGH (ref 70–99)
Glucose-Capillary: 115 mg/dL — ABNORMAL HIGH (ref 70–99)
Glucose-Capillary: 125 mg/dL — ABNORMAL HIGH (ref 70–99)
Glucose-Capillary: 158 mg/dL — ABNORMAL HIGH (ref 70–99)
Glucose-Capillary: 162 mg/dL — ABNORMAL HIGH (ref 70–99)
Glucose-Capillary: 186 mg/dL — ABNORMAL HIGH (ref 70–99)
Glucose-Capillary: 224 mg/dL — ABNORMAL HIGH (ref 70–99)
Glucose-Capillary: 234 mg/dL — ABNORMAL HIGH (ref 70–99)
Glucose-Capillary: 29 mg/dL — CL (ref 70–99)
Glucose-Capillary: 73 mg/dL (ref 70–99)
Glucose-Capillary: 91 mg/dL (ref 70–99)
Glucose-Capillary: 94 mg/dL (ref 70–99)

## 2022-11-14 LAB — CBC
HCT: 31.2 % — ABNORMAL LOW (ref 36.0–46.0)
Hemoglobin: 10.6 g/dL — ABNORMAL LOW (ref 12.0–15.0)
MCH: 28.6 pg (ref 26.0–34.0)
MCHC: 34 g/dL (ref 30.0–36.0)
MCV: 84.1 fL (ref 80.0–100.0)
Platelets: 253 10*3/uL (ref 150–400)
RBC: 3.71 MIL/uL — ABNORMAL LOW (ref 3.87–5.11)
RDW: 16.3 % — ABNORMAL HIGH (ref 11.5–15.5)
WBC: 19.1 10*3/uL — ABNORMAL HIGH (ref 4.0–10.5)
nRBC: 0.2 % (ref 0.0–0.2)

## 2022-11-14 LAB — MAGNESIUM: Magnesium: 2.1 mg/dL (ref 1.7–2.4)

## 2022-11-14 LAB — PHOSPHORUS
Phosphorus: <1 mg/dL — CL (ref 2.5–4.6)
Phosphorus: 3.5 mg/dL (ref 2.5–4.6)

## 2022-11-14 LAB — HCG, QUANTITATIVE, PREGNANCY: hCG, Beta Chain, Quant, S: <1 m[IU]/mL (ref <5)

## 2022-11-14 MED ORDER — DEXTROSE 10 % IV SOLN: INTRAVENOUS | Status: DC

## 2022-11-14 MED ORDER — VITAL HIGH PROTEIN PO LIQD: 1000.0000 mL | ORAL | Status: DC

## 2022-11-14 MED ORDER — POLYETHYLENE GLYCOL 3350 17 G PO PACK
17.0000 g | PACK | Freq: Every day | ORAL | Status: DC | PRN
  Administered 2022-11-14: 17 g via ORAL
  Filled 2022-11-14: qty 1

## 2022-11-14 MED ORDER — SODIUM CHLORIDE 0.9 % IV SOLN
1.5000 g | Freq: Four times a day (QID) | INTRAVENOUS | Status: DC
  Administered 2022-11-14 – 2022-11-15 (×3): 1.5 g via INTRAVENOUS
  Filled 2022-11-14 (×5): qty 4

## 2022-11-14 MED ORDER — POTASSIUM & SODIUM PHOSPHATES 280-160-250 MG PO PACK
2.0000 | PACK | Freq: Once | ORAL | Status: DC
  Filled 2022-11-14: qty 2

## 2022-11-14 MED ORDER — PROSOURCE TF20 ENFIT COMPATIBL EN LIQD: 60.0000 mL | Freq: Every day | ENTERAL | Status: DC

## 2022-11-14 MED ORDER — POTASSIUM & SODIUM PHOSPHATES 280-160-250 MG PO PACK
2.0000 | PACK | Freq: Three times a day (TID) | ORAL | Status: DC
  Filled 2022-11-14: qty 2

## 2022-11-14 MED ORDER — HYDRALAZINE HCL 20 MG/ML IJ SOLN: 10.0000 mg | Freq: Four times a day (QID) | INTRAMUSCULAR | Status: DC | PRN

## 2022-11-14 MED ORDER — LIDOCAINE HCL URETHRAL/MUCOSAL 2 % EX GEL
1.0000 | Freq: Once | CUTANEOUS | Status: AC
  Administered 2022-11-14: 1 via TOPICAL
  Filled 2022-11-14: qty 5

## 2022-11-14 MED ORDER — SODIUM CHLORIDE 0.9 % IV SOLN: INTRAVENOUS | Status: DC | PRN

## 2022-11-14 MED ORDER — GLUCERNA SHAKE PO LIQD
237.0000 mL | Freq: Three times a day (TID) | ORAL | Status: DC
  Administered 2022-11-14 – 2022-11-16 (×5): 237 mL via ORAL
  Filled 2022-11-14 (×10): qty 237

## 2022-11-14 MED ORDER — POTASSIUM PHOSPHATES 15 MMOLE/5ML IV SOLN
45.0000 mmol | Freq: Once | INTRAVENOUS | Status: AC
  Administered 2022-11-14: 45 mmol via INTRAVENOUS
  Filled 2022-11-14: qty 15

## 2022-11-14 MED ORDER — MINERAL OIL RE ENEM
1.0000 | ENEMA | Freq: Once | RECTAL | Status: AC
  Administered 2022-11-14: 1 via RECTAL
  Filled 2022-11-14: qty 1

## 2022-11-14 MED ORDER — POTASSIUM CHLORIDE 10 MEQ/100ML IV SOLN
10.0000 meq | INTRAVENOUS | Status: DC
  Administered 2022-11-14 (×4): 10 meq via INTRAVENOUS
  Filled 2022-11-14 (×6): qty 100

## 2022-11-14 NOTE — Progress Notes (Signed)
At midnight CBG check, tech informed this RN that patient's sugar was 44. Elink notified and sugar rechecked. Recheck was 23. Amp of D50 given. Patient's CO2 also noted to drop significantly from 36 to 17. Patient's other VSS. IV placed to decrease treatment delay and a BMET was drawn with the new IV start. Will recheck patient's CBG @ 0100

## 2022-11-14 NOTE — Progress Notes (Signed)
NAME:  [name], MRN:  [id number], DOB:  1986/01/12, LOS: 2 ADMISSION DATE:  11/12/2022, CONSULTATION DATE:  11/12/2022 REFERRING MD:  Dr. Lynelle Doctor, ER, CHIEF COMPLAINT:  AMS   History of Present Illness:  37 yo female smoker found by roommate with altered mental status with agitated delirium.  Hypotensive and hypothermic (temp 89.78F) in ER.  Glucose level > 600.  UDS positive for cocaine.  Started on IV insulin, IV fluids, precedex.  PCCM consulted to manage care in ICU.  Pertinent  Medical History  DM type 1, Chronic systolic CHF with recovered EF, HTN, Cocaine abuse  Significant Hospital Events: Including procedures, antibiotic start and stop dates in addition to other pertinent events   1/27 admit, start insulin gtt, start precedex 1/29 hypoglycemic episodes this AM, off insulin gtt, remains in restraints  Interim History / Subjective:   Started on D10 overnight after became hypoglycemic, Aline placed at finger stick glucose accuracy in question, CBG from [facility] was 29 On precedex this AM and still requiring restraints    Objective   Blood pressure 103/65, pulse 78, temperature 98.6 F (37 C), temperature source Bladder, resp. rate 14, height 5' (1.524 m), weight 39 kg, SpO2 91 %.        Intake/Output Summary (Last 24 hours) at 11/14/2022 0843 Last data filed at 11/14/2022 0800 Gross per 24 hour  Intake 4165.91 ml  Output 730 ml  Net 3435.91 ml    Filed Weights   11/12/22 1937 11/13/22 1034 11/14/22 0600  Weight: 38.6 kg 38.6 kg 39 kg    General:  thin, poorly nourished F appears older than stated age HEENT: MM pink/moist, extremely poor dentition, pupils equal Neuro: sleeping on precedex, arousable to voice and then agitated CV: s1s2 rrr, no m/r/g PULM:  good air movement bilaterally without rhonchi or wheezing  GI: soft, non-distended  Extremities: warm/dry, no edema, bandaged R finger Skin: no rashes or lesions    Resolved Hospital Problem list    Hyperkalemia Hypotension, hypothermia, AKI from hypovolemia.  Assessment & Plan:   DKA with DM type 1 - off insulin gtt, hypoglycemic overnight and now on D10 -SSI and levemir 25 units - unable to place NGT after multiple attempts to start TF -pt more awake, trial starting diet, if unable to may need IR consult for PANDA placement    Hypernatremia. -start FWF if able to get NG tube -encourage po fluids  - f/u BMET - monitor urine outpt  Acute toxic-metabolic encephalopathy. - from DKA, cocaine, renal failure - precedex for RASS goal 0 to -1 -improving somewhat, precedex requirement down-trending  Acute sinusitis. - continue rocephin  Low positive HCG. - from 11/12/22 - repeat lab on 1/29 now <1  Hx of HTN, Chronic systolic CHF. - hold outpt meds for now  Best Practice (right click and "Reselect all SmartList Selections" daily)   Diet/type: NPO DVT prophylaxis: prophylactic heparin  GI prophylaxis: N/A Lines: N/A Foley:  Yes, and it is still needed Code Status:  full code Last date of multidisciplinary goals of care discussion [pending, improving, will attempt to reach family ]  Labs       Latest Ref Rng & Units 11/14/2022    7:29 AM 11/14/2022    4:00 AM 11/13/2022   11:56 PM  CMP  Glucose 70 - 99 mg/dL 027  253  664   BUN 6 - 20 mg/dL 35  32  35   Creatinine 0.44 - 1.00 mg/dL 4.03  4.74  0.82   Sodium 135 - 145 mmol/L 157  153  158   Potassium 3.5 - 5.1 mmol/L 4.2  3.9  3.3   Chloride 98 - 111 mmol/L 121  118  119   CO2 22 - 32 mmol/L 30  29  31    Calcium 8.9 - 10.3 mg/dL 8.4  8.2  8.5        Latest Ref Rng & Units 11/14/2022    4:00 AM 11/12/2022   11:05 PM 11/12/2022    9:03 PM  CBC  WBC 4.0 - 10.5 K/uL 19.1  22.7    Hemoglobin 12.0 - 15.0 g/dL 29.5  28.4  13.2   Hematocrit 36.0 - 46.0 % 31.2  39.1  37.0   Platelets 150 - 400 K/uL 253  375      CBG (last 3)  Recent Labs    11/14/22 0525 11/14/22 0643 11/14/22 0734  GLUCAP 125* 109* 107*      ABG    Component Value Date/Time   HCO3 6.8 (L) 11/12/2022 2103   TCO2 7 (L) 11/12/2022 2103   ACIDBASEDEF 20.0 (H) 11/12/2022 2103   O2SAT 97 11/12/2022 2103    Critical care time: 38 minutes   CRITICAL CARE Performed by: Darcella Gasman Dj Senteno   Total critical care time: 38 minutes  Critical care time was exclusive of separately billable procedures and treating other patients.  Critical care was necessary to treat or prevent imminent or life-threatening deterioration.  Critical care was time spent personally by me on the following activities: development of treatment plan with patient and/or surrogate as well as nursing, discussions with consultants, evaluation of patient's response to treatment, examination of patient, obtaining history from patient or surrogate, ordering and performing treatments and interventions, ordering and review of laboratory studies, ordering and review of radiographic studies, pulse oximetry and re-evaluation of patient's condition.   Darcella Gasman Shakeria Robinette, PA-C [facility] See Amion for pager If no response to pager , please call [phone] until 7pm After 7:00 pm call Elink  [phone]

## 2022-11-14 NOTE — Inpatient Diabetes Management (Addendum)
Inpatient Diabetes Program Recommendations  AACE/ADA: New Consensus Statement on Inpatient Glycemic Control (2015)  Target Ranges:  Prepandial:   less than 140 mg/dL      Peak postprandial:   less than 180 mg/dL (1-2 hours)      Critically ill patients:  140 - 180 mg/dL    Latest Reference Range & Units 11/12/22 19:34 11/12/22 23:05 11/13/22 06:02 11/13/22 12:47  Beta-Hydroxybutyric Acid 0.05 - 0.27 mmol/L >8.00 (H) >8.00 (H) 7.78 (H) 1.02 (H)  (H): Data is abnormally high  Latest Reference Range & Units 11/12/22 19:34  Sodium 135 - 145 mmol/L 131 (L)  Potassium 3.5 - 5.1 mmol/L 6.8 (HH)  Chloride 98 - 111 mmol/L 84 (L)  CO2 22 - 32 mmol/L <7 (L)  Glucose 70 - 99 mg/dL >1,200 (HH)  BUN 6 - 20 mg/dL 76 (H)  Creatinine 0.44 - 1.00 mg/dL 2.75 (H)  Calcium 8.9 - 10.3 mg/dL 9.5  Anion gap 5 - 15  NOT CALCULATED  (HH): Data is critically high (L): Data is abnormally low (H): Data is abnormally high  Latest Reference Range & Units 11/12/22 19:22 11/12/22 20:52 11/12/22 21:52 11/12/22 22:25 11/12/22 22:59 11/12/22 23:36 11/13/22 00:12 11/13/22 01:15  Glucose-Capillary 70 - 99 mg/dL >600 (HH) >600 (HH)  IV Insulin Drip Started at 2118 >600 ([place]) >600 (HH) >600 (HH) >600 (HH) >600 (HH) >600 (HH)  ([place]): Data is critically high  Latest Reference Range & Units 11/13/22 01:57 11/13/22 02:32 11/13/22 03:06 11/13/22 03:44 11/13/22 04:25 11/13/22 05:10 11/13/22 06:05 11/13/22 06:41  Glucose-Capillary 70 - 99 mg/dL >600 (HH) >600 (HH) >600 (HH) >600 (HH) >600 (HH) 564 (HH) 505 (HH) 466 (H)  (HH): Data is critically high (H): Data is abnormally high  Latest Reference Range & Units 11/13/22 07:12 11/13/22 08:22 11/13/22 10:38 11/13/22 11:52 11/13/22 13:01 11/13/22 14:05 11/13/22 15:02 11/13/22 16:15  Glucose-Capillary 70 - 99 mg/dL 449 (H) 381 (H) 270 (H) 212 (H) 207 (H) 261 (H) 226 (H) 180 (H)  (H): Data is abnormally high  Latest Reference Range & Units 11/13/22 17:32 11/13/22 18:46 11/13/22 19:41  11/13/22 23:23 11/13/22 23:30 11/13/22 23:55 11/14/22 01:04 11/14/22 01:40  Glucose-Capillary 70 - 99 mg/dL 193 (H)  25 units Levemir @1708  198 (H)  IV Insulin Drip Stopped at 1926 152 (H)  3 units Novolog @2100  44 (LL) 23 (LL) 169 (H) 91 73  (LL): Data is critically low (H): Data is abnormally high  Latest Reference Range & Units 11/14/22 03:31 11/14/22 04:25 11/14/22 05:25 11/14/22 06:43  Glucose-Capillary 70 - 99 mg/dL 29 (LL) 162 (H) 125 (H) 109 (H)  (LL): Data is critically low (H): Data is abnormally high  Latest Reference Range & Units 11/12/22 19:37  COCAINE NONE DETECTED  POSITIVE !  !: Data is abnormal    Admit with: DKA with acidosis and encephalopathy/ Hyperkalemia  History: Type 1 Diabetes (makes NO insulin; requires basal, correction, and meal coverage insulin), Cocaine Abuse   Home DM Meds: Novolog 1 unit for every 15 grams Carbohydrates                             Novolog for Correction                             Lantus 35 units Daily    Current Orders: Levemir 25 units Daily      Novolog Moderate Correction Scale/  SSI (0-15 units) Q4 hours   A1c level Pending  MD- Note Hypoglycemia events after transition to SQ Insulin--D10% IVF added at 4am today  Please consider:  1. Reduce Levemir to 15 units Daily (0.4 units/kg--pt only 39 kg)  2. Reduce the Novolog SSI to the 0-9 unit scale Q4 hours    --Will follow patient during hospitalization--  Wyn Quaker RN, MSN, [facility] Diabetes Coordinator Inpatient Glycemic Control Team Team Pager: [phone] (8a-5p)

## 2022-11-14 NOTE — Progress Notes (Addendum)
[facility] Progress Note Patient Name: [name] DOB: [birth year] MRN: [id number]   Date of Service  11/14/2022  HPI/Events of Note  Severe constipation requiring disimpaction  eICU Interventions  Ordered lido jelly   2210: Added enema, prefer mineral oil vs Tap water enema instead of fleets given similar efficacy. Although gastroparesis is a possibility, hold on adding pro motility agents tonight. Continue aggressive bboiwel care and encourage PO intake when able.  Intervention Category Minor Interventions: Routine modifications to care plan (e.g. PRN medications for pain, fever)  Darcus Edds 11/14/2022, 8:14 PM

## 2022-11-14 NOTE — Progress Notes (Signed)
Instilled enema. Patient has low rectal tone and difficulty retaining the enema.

## 2022-11-14 NOTE — Progress Notes (Signed)
Pharmacy Antibiotic Note  [name] is a 37 y.o. female who presented to the ED on 11/12/2022 with AMS and was found to be hypothermic, hyperglycemic and hypotensive.  Pharmacy has been consulted on 11/14/22 to start unasyn for acute bacterial sinusitis.   Plan: - unasyn 1.5 gm IV q6h  ______________________________________  Height: 5' (152.4 cm) Weight: 39 kg (85 lb 15.7 oz) IBW/kg (Calculated) : 45.5  Temp (24hrs), Avg:98.6 F (37 C), Min:97.3 F (36.3 C), Max:99.7 F (37.6 C)  Recent Labs  Lab 11/12/22 1934 11/12/22 2016 11/12/22 2055 11/12/22 2305 11/13/22 0310 11/13/22 2356 11/14/22 0400 11/14/22 0729 11/14/22 1140 11/14/22 1536  WBC 19.9*  --   --  22.7*  --   --  19.1*  --   --   --   CREATININE 2.75*   < >  --  2.54*   < > 0.82 0.85 0.70 0.67 0.67  LATICACIDVEN  --   --  3.4* 1.8  --   --   --   --   --   --    < > = values in this interval not displayed.    Estimated Creatinine Clearance: 58.7 mL/min (by C-G formula based on SCr of 0.67 mg/dL).    No Known Allergies   Thank you for allowing pharmacy to be a part of this patient's care.  Lynelle Doctor 11/14/2022 5:04 PM

## 2022-11-14 NOTE — Progress Notes (Signed)
[facility] Progress Note Patient Name: Josanne Boerema DOB: 12-Jul-1986 MRN: [id number]   Date of Service  11/14/2022  HPI/Events of Note  Phosphate <1 K 3.9  eICU Interventions  Replete K Phos     Intervention Category Minor Interventions: Electrolytes abnormality - evaluation and management  Lyndon Chenoweth Rodman Pickle 11/14/2022, 4:59 AM

## 2022-11-14 NOTE — Progress Notes (Addendum)
Initial Nutrition Assessment  DOCUMENTATION CODES:   Severe malnutrition in context of chronic illness  INTERVENTION:  - Regular diet per MD.  - Glucerna Shake po TID, each supplement provides 220 kcal and 10 grams of protein - Encourage intake as tolerated.   - Recommend daily multivitamin. - Monitor weights.   - RD available to provide TF recommendations if unable to continue oral diet.   NUTRITION DIAGNOSIS:   Severe Malnutrition related to chronic illness as evidenced by severe fat depletion, severe muscle depletion.  GOAL:   Patient will meet greater than or equal to 90% of their needs  MONITOR:   PO intake, Supplement acceptance, Weight trends  REASON FOR ASSESSMENT:   Consult Enteral/tube feeding initiation and management  ASSESSMENT:   37 y.o. female with PMH of T1DM and cocaine use who presented after not "acting herself" for 2 days, found to be in DKA and have acute metabolic encephalopathy.  Received consult for trickle feeds and TF recommendations this AM. Per discussion with CCM MD, multiple NG tube placements attempted but failed. Plan to start oral diet instead and see how patient does before trying another tube placement.  Patient asleep at initial two visits this morning. Met with patient this afternoon and she was alert and eating lunch.  Patient still seemed somewhat confused. Unable to state UBW but denied recent changes in weight.  Reported she usually eats 3 meals a day at home.  When asked about her appetite she reports she has a lot going on. Discussed importance of trying to eat well and drinking supplements.   Medications reviewed and include: Insulin, D10 at 66mL/hr (provides 408 kcals over 24 hours)  Labs reviewed:  Na 157   Latest Reference Range & Units 11/14/22 04:00  Phosphorus 2.5 - 4.6 mg/dL <1.0 (LL)  (LL): Data is critically low   NUTRITION - FOCUSED PHYSICAL EXAM:  Flowsheet Row Most Recent Value  Orbital Region Moderate  depletion  Upper Arm Region Severe depletion  Thoracic and Lumbar Region Severe depletion  Buccal Region Moderate depletion  Temple Region Severe depletion  Clavicle Bone Region Severe depletion  Clavicle and Acromion Bone Region Severe depletion  Scapular Bone Region Unable to assess  Dorsal Hand Moderate depletion  Patellar Region Severe depletion  Anterior Thigh Region Severe depletion  Posterior Calf Region Severe depletion  Edema (RD Assessment) None  Hair Reviewed  Eyes Reviewed  Mouth Reviewed  Skin Reviewed  Nails Reviewed       Diet Order:   Diet Order             Diet regular Room service appropriate? Yes; Fluid consistency: Thin  Diet effective now                   EDUCATION NEEDS:  No education needs have been identified at this time  Skin:  Skin Assessment: Reviewed RN Assessment  Last BM:  1/28  Height:  Ht Readings from Last 1 Encounters:  11/13/22 5' (1.524 m)   Weight:  Wt Readings from Last 1 Encounters:  11/14/22 39 kg    BMI:  Body mass index is 16.79 kg/m.  Estimated Nutritional Needs:  Kcal:  1350-1550 kcals Protein:  60-80 grams Fluid:  >/= 1.5L    Samson Frederic RD, LDN For contact information, refer to [facility].

## 2022-11-14 NOTE — Procedures (Signed)
Arterial Catheter Insertion Procedure Note  [name]  [id number]  September 16, 1986  Date:11/14/22  Time:3:14 AM    Provider Performing: Levora Dredge    Procedure: Insertion of Arterial Line [phone]) without US guidance  Indication(s) Blood pressure monitoring and/or need for frequent ABGs  Consent Unable to obtain consent due to emergent nature of procedure.  Anesthesia None   Time Out Verified patient identification, verified procedure, site/side was marked, verified correct patient position, special equipment/implants available, medications/allergies/relevant history reviewed, required imaging and test results available.   Sterile Technique Maximal sterile technique including full sterile barrier drape, hand hygiene, sterile gown, sterile gloves, mask, hair covering, sterile ultrasound probe cover (if used).   Procedure Description Area of catheter insertion was cleaned with chlorhexidine and draped in sterile fashion. Without real-time ultrasound guidance an arterial catheter was placed into the left radial artery.  Appropriate arterial tracings confirmed on monitor.     Complications/Tolerance None; patient tolerated the procedure well.   EBL Minimal   Specimen(s) None

## 2022-11-14 NOTE — Progress Notes (Addendum)
[facility] Progress Note Patient Name: [name] DOB: [birth year] MRN: [id number]   Date of Service  11/14/2022  HPI/Events of Note  CBG 44>23. BMET 192 post-D50 K 3.3 Na 158 Cl 119  eICU Interventions  -Trend CBG -Replete K -No enteral access. If repeat glucose continues to be reduced, will start D5   CBG trending down. RN concerned about accuracy of fingersticks. Will request A-line placement for blood draws  3:34 AM - CBG from A-line 29. Given D50 amp. Start D10 gtt. BMET q4h  Intervention Category Minor Interventions: Electrolytes abnormality - evaluation and management  Jizel Cheeks Rodman Pickle 11/14/2022, 12:38 AM

## 2022-11-14 NOTE — Progress Notes (Signed)
Transition of Care [facility]) Screening Note   Patient Details  Name: [name] Date of Birth: May 19, 1986   Transition of Care [facility]) CM/SW Contact:    Lavenia Atlas, RN Phone Number: 11/14/2022, 7:04 PM    Transition of Care Department [facility]) has reviewed patient and no TOC needs have been identified at this time. We will continue to monitor patient advancement through interdisciplinary progression rounds. If new patient transition needs arise, please place a TOC consult.

## 2022-11-15 DIAGNOSIS — J9601 Acute respiratory failure with hypoxia: Secondary | ICD-10-CM

## 2022-11-15 DIAGNOSIS — E43 Unspecified severe protein-calorie malnutrition: Secondary | ICD-10-CM

## 2022-11-15 DIAGNOSIS — R739 Hyperglycemia, unspecified: Secondary | ICD-10-CM

## 2022-11-15 DIAGNOSIS — G9341 Metabolic encephalopathy: Secondary | ICD-10-CM | POA: Diagnosis not present

## 2022-11-15 LAB — CBC WITH DIFFERENTIAL/PLATELET
Abs Immature Granulocytes: 0.17 10*3/uL — ABNORMAL HIGH (ref 0.00–0.07)
Basophils Absolute: 0 10*3/uL (ref 0.0–0.1)
Basophils Relative: 0 %
Eosinophils Absolute: 0 10*3/uL (ref 0.0–0.5)
Eosinophils Relative: 0 %
HCT: 30.7 % — ABNORMAL LOW (ref 36.0–46.0)
Hemoglobin: 10.2 g/dL — ABNORMAL LOW (ref 12.0–15.0)
Immature Granulocytes: 1 %
Lymphocytes Relative: 17 %
Lymphs Abs: 2.7 10*3/uL (ref 0.7–4.0)
MCH: 28.7 pg (ref 26.0–34.0)
MCHC: 33.2 g/dL (ref 30.0–36.0)
MCV: 86.5 fL (ref 80.0–100.0)
Monocytes Absolute: 0.4 10*3/uL (ref 0.1–1.0)
Monocytes Relative: 3 %
Neutro Abs: 12.3 10*3/uL — ABNORMAL HIGH (ref 1.7–7.7)
Neutrophils Relative %: 79 %
Platelets: 177 10*3/uL (ref 150–400)
RBC: 3.55 MIL/uL — ABNORMAL LOW (ref 3.87–5.11)
RDW: 17.4 % — ABNORMAL HIGH (ref 11.5–15.5)
WBC: 15.7 10*3/uL — ABNORMAL HIGH (ref 4.0–10.5)
nRBC: 0 % (ref 0.0–0.2)

## 2022-11-15 LAB — MAGNESIUM: Magnesium: 1.8 mg/dL (ref 1.7–2.4)

## 2022-11-15 LAB — BASIC METABOLIC PANEL
Anion gap: 7 (ref 5–15)
Anion gap: 7 (ref 5–15)
Anion gap: 7 (ref 5–15)
BUN: 16 mg/dL (ref 6–20)
BUN: 17 mg/dL (ref 6–20)
BUN: 21 mg/dL — ABNORMAL HIGH (ref 6–20)
CO2: 24 mmol/L (ref 22–32)
CO2: 24 mmol/L (ref 22–32)
CO2: 25 mmol/L (ref 22–32)
Calcium: 7.1 mg/dL — ABNORMAL LOW (ref 8.9–10.3)
Calcium: 7.3 mg/dL — ABNORMAL LOW (ref 8.9–10.3)
Calcium: 7.5 mg/dL — ABNORMAL LOW (ref 8.9–10.3)
Chloride: 107 mmol/L (ref 98–111)
Chloride: 108 mmol/L (ref 98–111)
Chloride: 111 mmol/L (ref 98–111)
Creatinine, Ser: 0.46 mg/dL (ref 0.44–1.00)
Creatinine, Ser: 0.5 mg/dL (ref 0.44–1.00)
Creatinine, Ser: 0.56 mg/dL (ref 0.44–1.00)
GFR, Estimated: >60 mL/min (ref >60)
GFR, Estimated: >60 mL/min (ref >60)
GFR, Estimated: >60 mL/min (ref >60)
Glucose, Bld: 131 mg/dL — ABNORMAL HIGH (ref 70–99)
Glucose, Bld: 196 mg/dL — ABNORMAL HIGH (ref 70–99)
Glucose, Bld: 286 mg/dL — ABNORMAL HIGH (ref 70–99)
Potassium: 3.5 mmol/L (ref 3.5–5.1)
Potassium: 3.6 mmol/L (ref 3.5–5.1)
Potassium: 3.9 mmol/L (ref 3.5–5.1)
Sodium: 138 mmol/L (ref 135–145)
Sodium: 140 mmol/L (ref 135–145)
Sodium: 142 mmol/L (ref 135–145)

## 2022-11-15 LAB — HEMOGLOBIN A1C
Hgb A1c MFr Bld: >15.5 % — ABNORMAL HIGH (ref 4.8–5.6)
Mean Plasma Glucose: >398 mg/dL

## 2022-11-15 LAB — GLUCOSE, CAPILLARY
Glucose-Capillary: 122 mg/dL — ABNORMAL HIGH (ref 70–99)
Glucose-Capillary: 131 mg/dL — ABNORMAL HIGH (ref 70–99)
Glucose-Capillary: 280 mg/dL — ABNORMAL HIGH (ref 70–99)
Glucose-Capillary: 219 mg/dL — ABNORMAL HIGH (ref 70–99)
Glucose-Capillary: 188 mg/dL — ABNORMAL HIGH (ref 70–99)
Glucose-Capillary: 188 mg/dL — ABNORMAL HIGH (ref 70–99)
Glucose-Capillary: 45 mg/dL — ABNORMAL LOW (ref 70–99)
Glucose-Capillary: 109 mg/dL — ABNORMAL HIGH (ref 70–99)
Glucose-Capillary: 97 mg/dL (ref 70–99)

## 2022-11-15 LAB — PHOSPHORUS: Phosphorus: 1.4 mg/dL — ABNORMAL LOW (ref 2.5–4.6)

## 2022-11-15 MED ORDER — INSULIN ASPART 100 UNIT/ML IJ SOLN
0.0000 [IU] | Freq: Three times a day (TID) | INTRAMUSCULAR | Status: DC
  Administered 2022-11-16: 2 [IU] via SUBCUTANEOUS
  Administered 2022-11-16: 15 [IU] via SUBCUTANEOUS
  Administered 2022-11-16: 8 [IU] via SUBCUTANEOUS
  Administered 2022-11-17: 15 [IU] via SUBCUTANEOUS

## 2022-11-15 MED ORDER — MAGNESIUM SULFATE 2 GM/50ML IV SOLN
2.0000 g | Freq: Once | INTRAVENOUS | Status: AC
  Administered 2022-11-15: 2 g via INTRAVENOUS
  Filled 2022-11-15: qty 50

## 2022-11-15 MED ORDER — INSULIN ASPART 100 UNIT/ML IJ SOLN: 0.0000 [IU] | Freq: Every day | INTRAMUSCULAR | Status: DC

## 2022-11-15 MED ORDER — AMOXICILLIN-POT CLAVULANATE 875-125 MG PO TABS
1.0000 | ORAL_TABLET | Freq: Two times a day (BID) | ORAL | Status: DC
  Administered 2022-11-16 – 2022-11-17 (×3): 1 via ORAL
  Filled 2022-11-15 (×3): qty 1

## 2022-11-15 MED ORDER — INSULIN DETEMIR 100 UNIT/ML ~~LOC~~ SOLN
10.0000 [IU] | Freq: Once | SUBCUTANEOUS | Status: AC
  Administered 2022-11-15: 10 [IU] via SUBCUTANEOUS
  Filled 2022-11-15: qty 0.1

## 2022-11-15 MED ORDER — POTASSIUM PHOSPHATES 15 MMOLE/5ML IV SOLN
45.0000 mmol | Freq: Once | INTRAVENOUS | Status: AC
  Administered 2022-11-15: 45 mmol via INTRAVENOUS
  Filled 2022-11-15: qty 15

## 2022-11-15 MED ORDER — INSULIN DETEMIR 100 UNIT/ML ~~LOC~~ SOLN
35.0000 [IU] | Freq: Every day | SUBCUTANEOUS | Status: DC
  Administered 2022-11-16 – 2022-11-17 (×2): 35 [IU] via SUBCUTANEOUS
  Filled 2022-11-15 (×2): qty 0.35

## 2022-11-15 NOTE — Progress Notes (Signed)
[facility] ADULT ICU REPLACEMENT PROTOCOL   The patient does apply for the [facility] Adult ICU Electrolyte Replacment Protocol based on the criteria listed below:   1.Exclusion criteria: TCTS, ECMO, Dialysis, and Myasthenia Gravis patients 2. Is GFR >/= 30 ml/min? Yes.    Patient's GFR today is >60 3. Is SCr </= 2? Yes.   Patient's SCr is 0.46 mg/dL 4. Did SCr increase >/= 0.5 in 24 hours? No. 5.Pt's weight >40kg  Yes.   6. Abnormal electrolyte(s): potassium 3.6, phos 1.4, mag 1.8  7. Electrolytes replaced per protocol 8.  Call MD STAT for K+ </= 2.5, Phos </= 1, or Mag </= 1 Physician:  n/a  Darlys Gales 11/15/2022 5:11 AM

## 2022-11-15 NOTE — Progress Notes (Addendum)
NAME:  [name], MRN:  [id number], DOB:  10-27-1985, LOS: 3 ADMISSION DATE:  11/12/2022, CONSULTATION DATE:  11/12/2022 REFERRING MD:  Dr. Lynelle Doctor, ER, CHIEF COMPLAINT:  AMS   History of Present Illness:  37 yo female smoker found by roommate with altered mental status with agitated delirium.  Hypotensive and hypothermic (temp 89.47F) in ER.  Glucose level > 600.  UDS positive for cocaine.  Started on IV insulin, IV fluids, precedex.  PCCM consulted to manage care in ICU.  Pertinent  Medical History  DM type 1, Chronic systolic CHF with recovered EF, HTN, Cocaine abuse  Significant Hospital Events: Including procedures, antibiotic start and stop dates in addition to other pertinent events   1/27 admit, start insulin gtt, start precedex 1/29 hypoglycemic episodes this AM, off insulin gtt, remains in restraints 1/30 off precedex, insulin gtt and tolerating diet  Interim History / Subjective:   Hypoglycemia resolved with diet, agitation improved and off precedex this morning, fatigued but arousable Electrolytes replaced  Objective   Blood pressure (!) 134/95, pulse 82, temperature 98.2 F (36.8 C), resp. rate 15, height 5' (1.524 m), weight 39 kg, SpO2 100 %.        Intake/Output Summary (Last 24 hours) at 11/15/2022 1058 Last data filed at 11/15/2022 0800 Gross per 24 hour  Intake 1111.22 ml  Output 2175 ml  Net -1063.78 ml    Filed Weights   11/12/22 1937 11/13/22 1034 11/14/22 0600  Weight: 38.6 kg 38.6 kg 39 kg    General:  thin, poorly nourished F appears older than stated age HEENT: MM pink/moist, extremely poor dentition, pupils equal Neuro: sleeping, off precedex, arouses to voice and answers questions and then falls back asleep CV: s1s2 rrr, no m/r/g PULM:  good air movement bilaterally without rhonchi or wheezing  GI: soft, non-distended  Extremities: warm/dry, no edema, bandaged R finger Skin: no rashes or lesions    Resolved Hospital Problem list    Hyperkalemia Hypotension, hypothermia, AKI from hypovolemia, hypernatremia  Assessment & Plan:   DKA with DM type 1 Suspect poor compliance at baseline  - off insulin gtt, hypoglycemic yesterday-this has resolved with diet and D10 d/c'd -SSI and levemir 25 units, increase long-acting to home 35 units -A1c pending   Hypernatremia. -resolved with po fluids  Acute toxic-metabolic encephalopathy. - from DKA, cocaine, renal failure -improved, still fatigued but off precedex  Acute sinusitis. Seen on CTH - transition Unasyn to Augmentin for total treatment of 7 days  Low positive HCG. - from 11/12/22 - repeat lab on 1/29  <1  Hx of HTN, Chronic systolic CHF. - no outpatient medications, monitor BP as still low at night, prn hydralazine -avoid beta blockers in the setting of cocaine abuse  Best Practice (right click and "Reselect all SmartList Selections" daily)   Diet/type: Regular consistency (see orders) DVT prophylaxis: prophylactic heparin  GI prophylaxis: N/A Lines: N/A Foley:  Yes, and it is no longer needed and removal ordered  Code Status:  full code Last date of multidisciplinary goals of care discussion [pending, improving, unable to reach family 1/29 ]  Labs       Latest Ref Rng & Units 11/15/2022    7:44 AM 11/15/2022    4:01 AM 11/14/2022   11:41 PM  CMP  Glucose 70 - 99 mg/dL 409  811  914   BUN 6 - 20 mg/dL 17  16  21    Creatinine 0.44 - 1.00 mg/dL 7.82  9.56  2.13  Sodium 135 - 145 mmol/L 138  142  140   Potassium 3.5 - 5.1 mmol/L 3.9  3.6  3.5   Chloride 98 - 111 mmol/L 107  111  108   CO2 22 - 32 mmol/L 24  24  25    Calcium 8.9 - 10.3 mg/dL 7.1  7.5  7.3        Latest Ref Rng & Units 11/15/2022    4:01 AM 11/14/2022    4:00 AM 11/12/2022   11:05 PM  CBC  WBC 4.0 - 10.5 K/uL 15.7  19.1  22.7   Hemoglobin 12.0 - 15.0 g/dL 45.4  09.8  11.9   Hematocrit 36.0 - 46.0 % 30.7  31.2  39.1   Platelets 150 - 400 K/uL 177  253  375     CBG (last 3)   Recent Labs    11/15/22 0353 11/15/22 0658 11/15/22 1005  GLUCAP 131* 280* 219*     ABG    Component Value Date/Time   HCO3 6.8 (L) 11/12/2022 2103   TCO2 7 (L) 11/12/2022 2103   ACIDBASEDEF 20.0 (H) 11/12/2022 2103   O2SAT 97 11/12/2022 2103    Critical care time: n/a minutes      Darcella Gasman Qamar Aughenbaugh, PA-C [facility] See Amion for pager If no response to pager , please call [phone] until 7pm After 7:00 pm call Elink  [phone]

## 2022-11-16 ENCOUNTER — Encounter: Payer: Self-pay | Admitting: Student

## 2022-11-16 DIAGNOSIS — G9341 Metabolic encephalopathy: Secondary | ICD-10-CM | POA: Diagnosis not present

## 2022-11-16 DIAGNOSIS — E1011 Type 1 diabetes mellitus with ketoacidosis with coma: Secondary | ICD-10-CM

## 2022-11-16 DIAGNOSIS — F418 Other specified anxiety disorders: Secondary | ICD-10-CM | POA: Insufficient documentation

## 2022-11-16 DIAGNOSIS — F1490 Cocaine use, unspecified, uncomplicated: Secondary | ICD-10-CM

## 2022-11-16 DIAGNOSIS — I5022 Chronic systolic (congestive) heart failure: Secondary | ICD-10-CM

## 2022-11-16 DIAGNOSIS — R739 Hyperglycemia, unspecified: Secondary | ICD-10-CM

## 2022-11-16 DIAGNOSIS — G9340 Encephalopathy, unspecified: Secondary | ICD-10-CM

## 2022-11-16 DIAGNOSIS — E43 Unspecified severe protein-calorie malnutrition: Secondary | ICD-10-CM

## 2022-11-16 LAB — BASIC METABOLIC PANEL
Anion gap: 9 (ref 5–15)
BUN: 25 mg/dL — ABNORMAL HIGH (ref 6–20)
CO2: 20 mmol/L — ABNORMAL LOW (ref 22–32)
Calcium: 7.9 mg/dL — ABNORMAL LOW (ref 8.9–10.3)
Chloride: 104 mmol/L (ref 98–111)
Creatinine, Ser: 0.62 mg/dL (ref 0.44–1.00)
GFR, Estimated: >60 mL/min (ref >60)
Glucose, Bld: 481 mg/dL — ABNORMAL HIGH (ref 70–99)
Potassium: 4.8 mmol/L (ref 3.5–5.1)
Sodium: 133 mmol/L — ABNORMAL LOW (ref 135–145)

## 2022-11-16 LAB — GLUCOSE, CAPILLARY
Glucose-Capillary: 116 mg/dL — ABNORMAL HIGH (ref 70–99)
Glucose-Capillary: 165 mg/dL — ABNORMAL HIGH (ref 70–99)
Glucose-Capillary: 202 mg/dL — ABNORMAL HIGH (ref 70–99)
Glucose-Capillary: 276 mg/dL — ABNORMAL HIGH (ref 70–99)
Glucose-Capillary: 454 mg/dL — ABNORMAL HIGH (ref 70–99)

## 2022-11-16 LAB — MAGNESIUM: Magnesium: 2.2 mg/dL (ref 1.7–2.4)

## 2022-11-16 LAB — CBC
HCT: 34.8 % — ABNORMAL LOW (ref 36.0–46.0)
Hemoglobin: 11.2 g/dL — ABNORMAL LOW (ref 12.0–15.0)
MCH: 27.9 pg (ref 26.0–34.0)
MCHC: 32.2 g/dL (ref 30.0–36.0)
MCV: 86.6 fL (ref 80.0–100.0)
Platelets: 173 10*3/uL (ref 150–400)
RBC: 4.02 MIL/uL (ref 3.87–5.11)
RDW: 17.6 % — ABNORMAL HIGH (ref 11.5–15.5)
WBC: 9.9 10*3/uL (ref 4.0–10.5)
nRBC: 0 % (ref 0.0–0.2)

## 2022-11-16 LAB — PHOSPHORUS: Phosphorus: 3 mg/dL (ref 2.5–4.6)

## 2022-11-16 LAB — HEMOGLOBIN A1C
Hgb A1c MFr Bld: >15.5 % — ABNORMAL HIGH (ref 4.8–5.6)
Mean Plasma Glucose: >398 mg/dL

## 2022-11-16 MED ORDER — INSULIN ASPART 100 UNIT/ML IJ SOLN
6.0000 [IU] | Freq: Three times a day (TID) | INTRAMUSCULAR | Status: DC
  Administered 2022-11-16 – 2022-11-17 (×4): 6 [IU] via SUBCUTANEOUS

## 2022-11-16 MED ORDER — SODIUM CHLORIDE 0.9 % IV SOLN
8.0000 mg | Freq: Once | INTRAVENOUS | Status: AC
  Administered 2022-11-16: 8 mg via INTRAVENOUS
  Filled 2022-11-16: qty 4

## 2022-11-16 MED ORDER — SCOPOLAMINE 1 MG/3DAYS TD PT72: 1.0000 | MEDICATED_PATCH | TRANSDERMAL | Status: DC

## 2022-11-16 MED ORDER — METOCLOPRAMIDE HCL 5 MG/ML IJ SOLN
5.0000 mg | Freq: Three times a day (TID) | INTRAMUSCULAR | Status: DC
  Administered 2022-11-16 – 2022-11-17 (×5): 5 mg via INTRAVENOUS
  Filled 2022-11-16 (×5): qty 2

## 2022-11-16 NOTE — Progress Notes (Signed)
PROGRESS NOTE  [name] ZOX:[id number] DOB: 1986-02-03   [facility]P: [facility]  Patient is from: Home  DOA: 11/12/2022 LOS: 4  Chief complaints Chief Complaint  Patient presents with   Altered Mental Status     Brief Narrative / Interim history: 37 year old F with PMH of DM-1, systolic CHF, HTN, anxiety, depression and cocaine use brought to ED due to concern for increased unresponsiveness noted by roommate, and admitted for DKA, acute encephalopathy, aspiration pneumonia and agitation requiring insulin drip, IVF and and Precedex drip in ICU.  DKA none agitation resolved.  She was taken off Precedex drip and insulin drip, and transferred to [facility] hospitalist service on 11/16/2022.  Subjective: Seen and examined earlier this morning.  No major events overnight of this morning.  She complains nausea, and chest pain with deep breathing.  Objective: Vitals:   11/15/22 1943 11/16/22 0005 11/16/22 0345 11/16/22 1308  BP: (!) 140/86 (!) 135/99 (!) 146/97 126/82  Pulse: 77 77 92 92  Resp: 17 17 17 20   Temp: 97.6 F (36.4 C) 97.8 F (36.6 C) 98.2 F (36.8 C) 98.5 F (36.9 C)  TempSrc: Oral Oral Oral Oral  SpO2: 100% 99% 100% 100%  Weight:      Height:        Examination:  GENERAL: Appears frail.  Nontoxic. HEENT: MMM.  Vision and hearing grossly intact.  NECK: Supple.  No apparent JVD.  RESP:  No IWOB.  Fair aeration bilaterally. CVS:  RRR. Heart sounds normal.  ABD/GI/GU: BS+. Abd soft, NTND.  MSK/EXT:  Moves extremities.  Significant muscle mass and subcu fat loss. SKIN: Numerous skin cracks in her fingers and hands NEURO: Awake, alert and oriented appropriately.  No apparent focal neuro deficit. PSYCH: Calm. Normal affect.   Procedures:  None  Microbiology summarized: MRSA PCR screen positive. Blood cultures NGTD  Assessment and plan: Principal Problem:   DKA, type 1 (HCC) Active Problems:   Chronic systolic CHF (congestive heart failure) (HCC)    Cocaine use   Encephalopathy   Protein-calorie malnutrition, severe   Hyperglycemia   Acute respiratory failure with hypoxia (HCC)   Acute metabolic encephalopathy   Hypophosphatemia   Severe protein-energy malnutrition (HCC)  Uncontrolled type 1 diabetes with diabetic ketoacidosis: A1c 14.9 in 05/2022.  Likely due to poor compliance with meds. Recent Labs  Lab 11/15/22 1744 11/15/22 1935 11/16/22 0003 11/16/22 0756 11/16/22 1118  GLUCAP 109* 97 202* 454* 276*  -Basal insulin increased to home dose, 35 units. -Continue SSI-moderate -Add NovoLog 6 units 3 times daily with meals -Follow hemoglobin A1c -Further adjustment as appropriate. -Reglan 5 mg ACHS  Acute toxic metabolic encephalopathy: In the setting of DKA, cocaine use and aspiration pneumonia.  Resolved.  Aspiration pneumonia: Respiratory failure ruled out.  No documented desaturation.  Now on room air. -To complete 7 days of complete antibiotic course with p.o. Augmentin. -Encourage incentive spirometry, OOB/PT/OT   Chronic systolic CHF: Appears euvolemic on exam.  Not on diuretics at home.  Seems to be on Coreg and lisinopril on hold. -Hold home meds for now  Hypernatremia: Resolved. -resolved with po fluids   Acute sinusitis: Noted on CT head. -Should be covered on antibiotics as above   Low positive HCG: Repeat hCG 48 hours later less than 1.  Cocaine use: -Advised to refrain   Severe malnutrition Body mass index is 16.79 kg/m. Nutrition Problem: Severe Malnutrition Etiology: chronic illness Signs/Symptoms: severe fat depletion, severe muscle depletion Interventions: Refer to RD note for recommendations,  Glucerna shake   DVT prophylaxis:  heparin injection 5,000 Units Start: 11/12/22 2230 SCDs Start: 11/12/22 2218  Code Status: Full code Family Communication: None at bedside Level of care: Telemetry Status is: Inpatient Remains inpatient appropriate because: Uncontrolled diabetes with  hyperglycemia   Final disposition: Home once medically stable Consultants:  Pulmonology admitted patient  55 minutes with more than 50% spent in reviewing records, counseling patient/family and coordinating care.   Sch Meds:  Scheduled Meds:  amoxicillin-clavulanate  1 tablet Oral Q12H   Chlorhexidine Gluconate Cloth  6 each Topical Daily   feeding supplement (GLUCERNA SHAKE)  237 mL Oral TID BM   Gerhardt's butt cream   Topical QID   heparin  5,000 Units Subcutaneous Q8H   insulin aspart  0-15 Units Subcutaneous TID AC & HS   insulin aspart  0-5 Units Subcutaneous QHS   insulin aspart  6 Units Subcutaneous TID WC   insulin detemir  35 Units Subcutaneous Daily   leptospermum manuka honey  1 Application Topical Daily   metoCLOPramide (REGLAN) injection  5 mg Intravenous TID AC & HS   mupirocin ointment  1 Application Nasal BID   potassium & sodium phosphates  2 packet Oral Once   scopolamine  1 patch Transdermal Q72H   Continuous Infusions:  sodium chloride Stopped (11/15/22 1400)   dextrose Stopped (11/14/22 1823)   PRN Meds:.[CANCELED] Place/Maintain arterial line **AND** sodium chloride, dextrose, hydrALAZINE, influenza vac split quadrivalent PF, lip balm, mouth rinse, pneumococcal 23 valent vaccine, polyethylene glycol  Antimicrobials: Anti-infectives (From admission, onward)    Start     Dose/Rate Route Frequency Ordered Stop   11/16/22 1000  amoxicillin-clavulanate (AUGMENTIN) 875-125 MG per tablet 1 tablet        1 tablet Oral Every 12 hours 11/15/22 1117 11/21/22 0959   11/14/22 2300  vancomycin (VANCOCIN) IVPB 750 mg/150 ml premix  Status:  Discontinued        750 mg 150 mL/hr over 60 Minutes Intravenous Every 48 hours 11/13/22 0950 11/13/22 1112   11/14/22 1815  ampicillin-sulbactam (UNASYN) 1.5 g in sodium chloride 0.9 % 100 mL IVPB  Status:  Discontinued        1.5 g 200 mL/hr over 30 Minutes Intravenous Every 6 hours 11/14/22 1715 11/15/22 1117   11/13/22 1400   cefTRIAXone (ROCEPHIN) 2 g in sodium chloride 0.9 % 100 mL IVPB  Status:  Discontinued        2 g 200 mL/hr over 30 Minutes Intravenous Daily 11/13/22 1113 11/14/22 1715   11/12/22 2052  ceFEPIme (MAXIPIME) 2 g in sodium chloride 0.9 % 100 mL IVPB  Status:  Discontinued        2 g 200 mL/hr over 30 Minutes Intravenous Every 24 hours 11/12/22 2052 11/13/22 1112   11/12/22 2050  vancomycin variable dose per unstable renal function (pharmacist dosing)  Status:  Discontinued         Does not apply See admin instructions 11/12/22 2052 11/13/22 0950   11/12/22 2045  ceFEPIme (MAXIPIME) 2 g in sodium chloride 0.9 % 100 mL IVPB  Status:  Discontinued        2 g 200 mL/hr over 30 Minutes Intravenous  Once 11/12/22 2043 11/12/22 2052   11/12/22 2045  metroNIDAZOLE (FLAGYL) IVPB 500 mg        500 mg 100 mL/hr over 60 Minutes Intravenous  Once 11/12/22 2043 11/12/22 2257   11/12/22 2045  vancomycin (VANCOCIN) IVPB 1000 mg/200 mL premix  1,000 mg 200 mL/hr over 60 Minutes Intravenous  Once 11/12/22 2043 11/13/22 0007        I have personally reviewed the following labs and images: CBC: Recent Labs  Lab 11/12/22 1934 11/12/22 2016 11/12/22 2103 11/12/22 2305 11/14/22 0400 11/15/22 0401 11/16/22 0656  WBC 19.9*  --   --  22.7* 19.1* 15.7* 9.9  NEUTROABS  --   --   --   --   --  12.3*  --   HGB 11.9*   < > 12.6 12.2 10.6* 10.2* 11.2*  HCT 43.4   < > 37.0 39.1 31.2* 30.7* 34.8*  MCV 105.1*  --   --  93.8 84.1 86.5 86.6  PLT 399  --   --  375 253 177 173   < > = values in this interval not displayed.   BMP &GFR Recent Labs  Lab 11/14/22 0400 11/14/22 0729 11/14/22 1425 11/14/22 1536 11/14/22 2341 11/15/22 0401 11/15/22 0744 11/16/22 0656  NA 153*   < >  --  143 140 142 138 133*  K 3.9   < >  --  3.5 3.5 3.6 3.9 4.8  CL 118*   < >  --  111 108 111 107 104  CO2 29   < >  --  24 25 24 24  20*  GLUCOSE 177*   < >  --  120* 196* 131* 286* 481*  BUN 32*   < >  --  28* 21* 16 17  25*  CREATININE 0.85   < >  --  0.67 0.56 0.46 0.50 0.62  CALCIUM 8.2*   < >  --  7.3* 7.3* 7.5* 7.1* 7.9*  MG 2.1  --   --   --   --  1.8  --  2.2  PHOS <1.0*  --  3.5  --   --  1.4*  --  3.0   < > = values in this interval not displayed.   Estimated Creatinine Clearance: 58.7 mL/min (by C-G formula based on SCr of 0.62 mg/dL). Liver & Pancreas: Recent Labs  Lab 11/12/22 1934  AST 77*  ALT 82*  ALKPHOS 166*  BILITOT 1.8*  PROT 5.7*  ALBUMIN 2.5*   No results for input(s): "LIPASE", "AMYLASE" in the last 168 hours. No results for input(s): "AMMONIA" in the last 168 hours. Diabetic: Recent Labs    11/15/22 0401  HGBA1C DISREGARD RESULTS.  SEE [id number]. TESTING TO BE CONFIRMED AT [facility]. NOTIFIED BRITTANY CLAPP,RN AT [facility] AT 1220 11/15/2022 BY ZBEECH.   Recent Labs  Lab 11/15/22 1744 11/15/22 1935 11/16/22 0003 11/16/22 0756 11/16/22 1118  GLUCAP 109* 97 202* 454* 276*   Cardiac Enzymes: No results for input(s): "CKTOTAL", "CKMB", "CKMBINDEX", "TROPONINI" in the last 168 hours. No results for input(s): "PROBNP" in the last 8760 hours. Coagulation Profile: No results for input(s): "INR", "PROTIME" in the last 168 hours. Thyroid Function Tests: No results for input(s): "TSH", "T4TOTAL", "FREET4", "T3FREE", "THYROIDAB" in the last 72 hours. Lipid Profile: No results for input(s): "CHOL", "HDL", "LDLCALC", "TRIG", "CHOLHDL", "LDLDIRECT" in the last 72 hours. Anemia Panel: No results for input(s): "VITAMINB12", "FOLATE", "FERRITIN", "TIBC", "IRON", "RETICCTPCT" in the last 72 hours. Urine analysis:    Component Value Date/Time   COLORURINE YELLOW 11/12/2022 1937   APPEARANCEUR CLEAR 11/12/2022 1937   LABSPEC 1.022 11/12/2022 1937   PHURINE 5.0 11/12/2022 1937   GLUCOSEU >=500 (A) 11/12/2022 1937   HGBUR NEGATIVE 11/12/2022 1937   BILIRUBINUR NEGATIVE 11/12/2022  1937   KETONESUR 20 (A) 11/12/2022 1937   PROTEINUR 30 (A) 11/12/2022 1937   NITRITE NEGATIVE 11/12/2022  1937   LEUKOCYTESUR NEGATIVE 11/12/2022 1937   Sepsis Labs: Invalid input(s): "PROCALCITONIN", "LACTICIDVEN"  Microbiology: Recent Results (from the past 240 hour(s))  Blood culture (routine x 2)     Status: None (Preliminary result)   Collection Time: 11/12/22  8:55 PM   Specimen: BLOOD  Result Value Ref Range Status   Specimen Description BLOOD SITE NOT SPECIFIED  Final   Special Requests   Final    BOTTLES DRAWN AEROBIC AND ANAEROBIC Blood Culture adequate volume   Culture   Final    NO GROWTH 4 DAYS Performed at [facility], [address], [place], [place] [address]    Report Status PENDING  Incomplete  Blood culture (routine x 2)     Status: None (Preliminary result)   Collection Time: 11/13/22 10:41 AM   Specimen: BLOOD LEFT HAND  Result Value Ref Range Status   Specimen Description   Final    BLOOD LEFT HAND BOTTLES DRAWN AEROBIC AND ANAEROBIC Performed at [facility], [address] [place], [place] [address]    Special Requests   Final    Blood Culture results may not be optimal due to an excessive volume of blood received in culture bottles Performed at [facility], [address], [place], [place] [address]    Culture   Final    NO GROWTH 3 DAYS Performed at [facility], [address], [place], [place] [address]    Report Status PENDING  Incomplete  MRSA Next Gen by PCR, Nasal     Status: Abnormal   Collection Time: 11/13/22 10:43 AM   Specimen: Nasal Mucosa; Nasal Swab  Result Value Ref Range Status   MRSA by PCR Next Gen DETECTED (A) NOT DETECTED Final    Comment: (NOTE) The GeneXpert MRSA Assay (FDA approved for NASAL specimens only), is one component of a comprehensive MRSA colonization surveillance program. It is not intended to diagnose MRSA infection nor to guide or monitor treatment for MRSA infections. Test performance is not FDA approved in patients less than 66 years old. Performed at [facility], [address] [place], [place] [address]     Radiology Studies: No results found.    Marcine Gadway T. Ruthanne Mcneish [facility]  If 7PM-7AM, please contact night-coverage [email or web address] 11/16/2022, 3:21 PM

## 2022-11-16 NOTE — Progress Notes (Signed)
[facility] Progress Note Patient Name: [name] DOB: [birth year] MRN: [id number]   Date of Service  11/16/2022  HPI/Events of Note  Patient initially with DKA in the setting of IDDM. Now transitioned but persistent nausea, qtc 529  eICU Interventions  Add Zofran once, scopolamine patch     Intervention Category Minor Interventions: Routine modifications to care plan (e.g. PRN medications for pain, fever)  Shaindy Reader 11/16/2022, 2:47 AM

## 2022-11-16 NOTE — Inpatient Diabetes Management (Addendum)
Inpatient Diabetes Program Recommendations  AACE/ADA: New Consensus Statement on Inpatient Glycemic Control (2015)  Target Ranges:  Prepandial:   less than 140 mg/dL      Peak postprandial:   less than 180 mg/dL (1-2 hours)      Critically ill patients:  140 - 180 mg/dL   Lab Results  Component Value Date   GLUCAP 454 (H) 11/16/2022   HGBA1C  11/15/2022    DISREGARD RESULTS.  SEE [id number]. TESTING TO BE CONFIRMED AT [facility]. NOTIFIED BRITTANY CLAPP,RN AT [place] [address] 11/15/2022 BY ZBEECH.    Review of Glycemic Control  Diabetes history: DM1 Outpatient Diabetes medications: Lantus 35 QD, Novolog s/s, 1:15 CHO ratio and CF 50 with goal of 140 mg/dL. Current orders for Inpatient glycemic control: Levemir 10 units 1x @1430  on 1/30, then 35 units QD, Novolog 0-15 TID with meals and 0-5 HS + 6 units TID  CBG 481 this am HgbA1C - 15.9%  Inpatient Diabetes Program Recommendations:    Beta hydoxy lab needed to r/o mild DKA  Levemir 35 units QD   May need increase in meal coverage Novolog to 8 units TID if post-prandials > 180 mg/dL.   Spoke with pt late yesterday afternoon and pt was sitting up in bed eating after hypo of 45. Pt states she usually does not have hypoglycemia at home. Reviewed s/s and treatment. Pt states she did not take her long-acting Levemir or Novolog when she was not eating for 3 days. States I didn't think I needed it since I wasn't eating. Reviewed sick day rules and encouraged pt to monitor blood sugars more frequently when feeling sick. Stressed importance of always taking her long-acting insulin, even when not eating, and always use correction. Do not use meal coverage when not eating. Pt voices understanding. Instructed pt to f/u with Endo within a week or two. Take logbook of blood sugars for MD to review. Answered all questions.   Thank you. Lorenda Peck, RD, LDN, [facility] Inpatient Diabetes Coordinator [phone]

## 2022-11-16 NOTE — TOC Initial Note (Signed)
Transition of Care [facility]) - Initial/Assessment Note    Patient Details  Name: [name] MRN: [id number] Date of Birth: 1986-03-09  Transition of Care [facility]) CM/SW Contact:    Beckie Busing, RN Phone Number:[phone]  11/16/2022, 2:10 PM  Clinical Narrative:                 [facility] acknowledges consult for medication assistance and substance abuse counseling / education. CM at bedside introduced self to patient and explained consult. Patient makes Cm aware that she has medicaid and understands that she does not need medication assistance. CAGE aid completed. Patient is agreeable to substance abuse resources. Information has been added to AVS and will print with AVS.  Patient states that she is from home alone where she normally functions independently. Patient reports that she does have a PCP that she does follow up with. Patient states that her medications are affordable and that she hs transportation with her sister as needed. Currently patient has no DME or home health needs. No other TOC needs are noted at this time.   Expected Discharge Plan: Home/Self Care Barriers to Discharge: Continued Medical Work up   Patient Goals and CMS Choice Patient states their goals for this hospitalization and ongoing recovery are:: Ready to get better to go home CMS Medicare.gov Compare Post Acute Care list provided to::  (n/a) Choice offered to / list presented to : NA [place] ownership interest in [facility].provided to::  (n/a)    Expected Discharge Plan and Services In-house Referral: NA Discharge Planning Services: CM Consult Post Acute Care Choice: NA Living arrangements for the past 2 months: Single Family Home                 DME Arranged: N/A DME Agency: NA       HH Arranged: NA HH Agency: NA        Prior Living Arrangements/Services Living arrangements for the past 2 months: Single Family Home Lives with:: Self Patient language and need for interpreter  reviewed:: Yes Do you feel safe going back to the place where you live?: Yes      Need for Family Participation in Patient Care: Yes (Comment) Care giver support system in place?: Yes (comment) Current home services:  (n/a) Criminal Activity/Legal Involvement Pertinent to Current Situation/Hospitalization: No - Comment as needed  Activities of Daily Living Home Assistive Devices/Equipment: CBG Meter, Dentures (specify type) ADL Screening (condition at time of admission) Patient's cognitive ability adequate to safely complete daily activities?: Yes Is the patient deaf or have difficulty hearing?: No Does the patient have difficulty seeing, even when wearing glasses/contacts?: No Does the patient have difficulty concentrating, remembering, or making decisions?: No Patient able to express need for assistance with ADLs?: Yes Does the patient have difficulty dressing or bathing?: No Independently performs ADLs?: Yes (appropriate for developmental age) Does the patient have difficulty walking or climbing stairs?: No Weakness of Legs: Both Weakness of Arms/Hands: Both  Permission Sought/Granted Permission sought to share information with : Family Supports Permission granted to share information with : No              Emotional Assessment Appearance:: Appears older than stated age Attitude/Demeanor/Rapport: Gracious Affect (typically observed): Accepting Orientation: : Oriented to Self, Oriented to Place, Oriented to  Time, Oriented to Situation Alcohol / Substance Use: Illicit Drugs Psych Involvement: No (comment)  Admission diagnosis:  DKA, type 1 (HCC) [E10.10] Cocaine use [F14.90] Hypothermia, initial encounter [T68.XXXA] 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 [place] [address] Phone: [phone] Fax: [phone]     Social Determinants of Health (SDOH) Social History: SDOH Screenings   Tobacco Use: High Risk (11/14/2022)   SDOH Interventions:     Readmission Risk Interventions    11/16/2022    1:57 PM  Readmission Risk Prevention Plan  Transportation Screening Complete  PCP or Specialist Appt within 5-7 Days Complete  Home Care Screening Complete  Medication Review (RN CM) Referral to Pharmacy

## 2022-11-17 ENCOUNTER — Encounter: Payer: Self-pay | Admitting: Emergency Medicine

## 2022-11-17 DIAGNOSIS — J9601 Acute respiratory failure with hypoxia: Secondary | ICD-10-CM | POA: Diagnosis not present

## 2022-11-17 DIAGNOSIS — G9341 Metabolic encephalopathy: Secondary | ICD-10-CM | POA: Diagnosis not present

## 2022-11-17 DIAGNOSIS — I5022 Chronic systolic (congestive) heart failure: Secondary | ICD-10-CM | POA: Diagnosis not present

## 2022-11-17 DIAGNOSIS — E1011 Type 1 diabetes mellitus with ketoacidosis with coma: Secondary | ICD-10-CM | POA: Diagnosis not present

## 2022-11-17 LAB — RENAL FUNCTION PANEL
Albumin: 2.2 g/dL — ABNORMAL LOW (ref 3.5–5.0)
Anion gap: 5 (ref 5–15)
BUN: 23 mg/dL — ABNORMAL HIGH (ref 6–20)
CO2: 23 mmol/L (ref 22–32)
Calcium: 8.3 mg/dL — ABNORMAL LOW (ref 8.9–10.3)
Chloride: 106 mmol/L (ref 98–111)
Creatinine, Ser: 0.57 mg/dL (ref 0.44–1.00)
GFR, Estimated: >60 mL/min (ref >60)
Glucose, Bld: 329 mg/dL — ABNORMAL HIGH (ref 70–99)
Phosphorus: 3.2 mg/dL (ref 2.5–4.6)
Potassium: 4.5 mmol/L (ref 3.5–5.1)
Sodium: 134 mmol/L — ABNORMAL LOW (ref 135–145)

## 2022-11-17 LAB — CULTURE, BLOOD (ROUTINE X 2)
Culture: NO GROWTH
Special Requests: ADEQUATE

## 2022-11-17 LAB — CBC
HCT: 33.8 % — ABNORMAL LOW (ref 36.0–46.0)
Hemoglobin: 10.7 g/dL — ABNORMAL LOW (ref 12.0–15.0)
MCH: 27.8 pg (ref 26.0–34.0)
MCHC: 31.7 g/dL (ref 30.0–36.0)
MCV: 87.8 fL (ref 80.0–100.0)
Platelets: 137 10*3/uL — ABNORMAL LOW (ref 150–400)
RBC: 3.85 MIL/uL — ABNORMAL LOW (ref 3.87–5.11)
RDW: 17.2 % — ABNORMAL HIGH (ref 11.5–15.5)
WBC: 6.8 10*3/uL (ref 4.0–10.5)
nRBC: 0 % (ref 0.0–0.2)

## 2022-11-17 LAB — GLUCOSE, CAPILLARY: Glucose-Capillary: 382 mg/dL — ABNORMAL HIGH (ref 70–99)

## 2022-11-17 LAB — MAGNESIUM: Magnesium: 1.8 mg/dL (ref 1.7–2.4)

## 2022-11-17 MED ORDER — METOCLOPRAMIDE HCL 5 MG PO TABS: 5.0000 mg | ORAL_TABLET | Freq: Three times a day (TID) | ORAL | 0 refills | Status: AC

## 2022-11-17 MED ORDER — INSULIN ASPART 100 UNIT/ML IJ SOLN: 0.0000 [IU] | Freq: Three times a day (TID) | INTRAMUSCULAR | 11 refills | Status: AC

## 2022-11-17 MED ORDER — INSULIN GLARGINE 100 UNIT/ML ~~LOC~~ SOLN: 40.0000 [IU] | Freq: Every day | SUBCUTANEOUS | 2 refills | Status: AC

## 2022-11-17 MED ORDER — AMOXICILLIN-POT CLAVULANATE 875-125 MG PO TABS: 1.0000 | ORAL_TABLET | Freq: Two times a day (BID) | ORAL | 0 refills | Status: AC

## 2022-11-17 NOTE — Progress Notes (Signed)
Attempted to call pt, number will not dieal.  No answer at daughter's number

## 2022-11-17 NOTE — Discharge Summary (Signed)
Physician Discharge Summary  [name] DDU:[id number] DOB: 1986-02-03 DOA: 11/12/2022  PCP: [facility]  Admit date: 11/12/2022 Discharge date: 11/17/2022 Admitted From: Home Disposition: Home Recommendations for Outpatient Follow-up:  Follow up with PCP in 1 to 2 weeks Assess glycemic control and adjust insulin dose as appropriate Check CMP and CBC at follow-up Please follow up on the following pending results: None  Home Health: Not required Equipment/Devices: Not required  Discharge Condition: Stable CODE STATUS: Full code  Follow-up Information     [facility]. Schedule an appointment as soon as possible for a visit in 1 week(s).   Contact information: [address] [place] [address] [phone]                 Hospital course 37 year old F with PMH of DM-1, systolic CHF, HTN, anxiety, depression and cocaine use brought to ED due to concern for increased unresponsiveness noted by roommate, and admitted for DKA, acute encephalopathy, aspiration pneumonia and agitation requiring insulin drip, IVF and and Precedex drip in ICU.  DKA none agitation resolved.  She was taken off Precedex drip and insulin drip, and transferred to [facility] hospitalist service on 11/16/2022.   Patient remained stable on the regular floor.  Blood glucose improved.  She is discharged on Lantus 40 units daily and moderate sliding scale for diabetes.  Discharged on p.o. Augmentin for possible aspiration pneumonia.  See individual problem list below for more.   Problems addressed during this hospitalization Principal Problem:   DKA, type 1 (HCC) Active Problems:   Chronic systolic CHF (congestive heart failure) (HCC)   Cocaine use   Encephalopathy   Protein-calorie malnutrition, severe   Hyperglycemia   Acute metabolic encephalopathy   Hypophosphatemia   Severe protein-energy malnutrition (HCC)   Uncontrolled type 1 diabetes with diabetic ketoacidosis:  A1c > 15.5%.  Likely due to poor medication and dietary compliance. Recent Labs  Lab 11/16/22 0756 11/16/22 1118 11/16/22 1623 11/16/22 2124 11/17/22 0749  GLUCAP 454* 276* 165* 116* 382*  -Increase home Lantus from 35 to 40 units daily -Continue sliding scale-moderate to scale. -Counseled on the importance of medication and dietary compliance  Acute toxic metabolic encephalopathy: In the setting of DKA, cocaine use and aspiration pneumonia.  Resolved.   Aspiration pneumonia: Respiratory failure ruled out.  No documented desaturation.  Now on room air. -Discharged on p.o. Augmentin for 3 more days to complete a total of 7 days course   Chronic systolic CHF: Appears euvolemic on exam.  Not on diuretics at home.  Seems to be on Coreg and lisinopril on hold. -Continue home medications.   Hypernatremia/hyponatremia: Resolved.   Acute sinusitis: Noted on CT head. -Should be covered on antibiotics as above   Low positive HCG: Repeat hCG 48 hours later less than 1.   Cocaine use: -Advised to refrain and provided with resources.   Severe malnutrition Nutrition Problem: Severe Malnutrition Etiology: chronic illness Signs/Symptoms: severe fat depletion, severe muscle depletion Interventions: Refer to RD note for recommendations, Glucerna shake     Vital signs Vitals:   11/16/22 0345 11/16/22 1308 11/16/22 2121 11/17/22 0456  BP: (!) 146/97 126/82 (!) 133/93 (!) 139/98  Pulse: 92 92 85 75  Temp: 98.2 F (36.8 C) 98.5 F (36.9 C) 98 F (36.7 C) 97.6 F (36.4 C)  Resp: 17 20 18 18   Height:      Weight:      SpO2: 100% 100% 100% 100%  TempSrc: Oral Oral Oral Oral  BMI (Calculated):         Discharge exam  GENERAL: Appears frail.  No apparent distress. HEENT: MMM.  Vision and hearing grossly intact.  NECK: Supple.  No apparent JVD.  RESP:  No IWOB.  Fair aeration bilaterally. CVS:  RRR. Heart sounds normal.  ABD/GI/GU: BS+. Abd soft, NTND.  MSK/EXT:  Moves  extremities.  Significant muscle mass and subcu fat loss. SKIN: Skin cracks in fingers and hands NEURO: Awake and alert. Oriented appropriately.  No apparent focal neuro deficit. PSYCH: Calm. Normal affect.   Discharge Instructions Discharge Instructions     Call MD for:  persistant nausea and vomiting   Complete by: As directed    Diet - low sodium heart healthy   Complete by: As directed    Diet Carb Modified   Complete by: As directed    Discharge instructions   Complete by: As directed    It has been a pleasure taking care of you!  You were hospitalized due to diabetic ketoacidosis and pneumonia for which you have been treated.  We are discharging you on more antibiotics to complete treatment for pneumonia.  We have made adjustment to your insulin for your diabetes.  Please review your new medication list and the directions on your medications before you take them.  It is very important that you use your medication as prescribed.   We strongly recommend not using cocaine.  Follow-up with your primary care doctor and/or endocrinologist in 1 to 2 weeks or sooner if needed.   Take care,   Increase activity slowly   Complete by: As directed    No wound care   Complete by: As directed       Allergies as of 11/17/2022       Reactions   Codeine Anaphylaxis, Nausea And Vomiting   Penicillins Rash   As a teenager   Tramadol Itching        Medication List     STOP taking these medications    NovoLOG FlexPen 100 UNIT/ML FlexPen Generic drug: insulin aspart Replaced by: insulin aspart 100 UNIT/ML injection       TAKE these medications    amoxicillin-clavulanate 875-125 MG tablet Commonly known as: AUGMENTIN Take 1 tablet by mouth every 12 (twelve) hours for 3 days.   insulin aspart 100 UNIT/ML injection Commonly known as: novoLOG Inject 0-15 Units into the skin 4 (four) times daily -  before meals and at bedtime. CBG 70 - 120: 0 units CBG 121 - 150: 2 units CBG  151 - 200: 3 units CBG 201 - 250: 5 units CBG 251 - 300: 8 units CBG 301 - 350: 11 units CBG 351 - 400: 15 units CBG > 400: call MD and obtain STAT lab verification Replaces: NovoLOG FlexPen 100 UNIT/ML FlexPen   insulin glargine 100 UNIT/ML injection Commonly known as: LANTUS Inject 0.4 mLs (40 Units total) into the skin daily. What changed: how much to take   metoCLOPramide 5 MG tablet Commonly known as: Reglan Take 1 tablet (5 mg total) by mouth 4 (four) times daily -  before meals and at bedtime.        Consultations: Pulmonology admitted patient.  Procedures/Studies:   CT Head Wo Contrast  Result Date: 11/12/2022 CLINICAL DATA:  Altered mental status. EXAM: CT HEAD WITHOUT CONTRAST TECHNIQUE: Contiguous axial images were obtained from the base of the skull through the vertex without intravenous contrast. RADIATION DOSE REDUCTION: This exam was performed according to the departmental  dose-optimization program which includes automated exposure control, adjustment of the mA and/or kV according to patient size and/or use of iterative reconstruction technique. COMPARISON:  None Available. FINDINGS: Brain: No intracranial hemorrhage, mass effect, or midline shift. No hydrocephalus. The basilar cisterns are patent. No evidence of territorial infarct or acute ischemia. No extra-axial or intracranial fluid collection. Vascular: No hyperdense vessel or unexpected calcification. Skull: No fracture or focal lesion. Sinuses/Orbits: Mucosal thickening of left ethmoid air cells. Mucosal thickening with fluid level in the left maxillary sinus. No mastoid effusion. Other: None. IMPRESSION: 1. No acute intracranial abnormality. 2. Left maxillary and ethmoid sinus mucosal thickening with fluid level, possible acute sinusitis. Electronically Signed   By: Keith Rake M.D.   On: 11/12/2022 21:14   DG Chest Portable 1 View  Result Date: 11/12/2022 CLINICAL DATA:  Altered mental status. EXAM: PORTABLE  CHEST 1 VIEW COMPARISON:  None Available. FINDINGS: The heart size and mediastinal contours are within normal limits. Both lungs are clear. The visualized skeletal structures are unremarkable. There are numerous overlying monitor wires. IMPRESSION: No active disease. Electronically Signed   By: Telford Nab M.D.   On: 11/12/2022 20:05       The results of significant diagnostics from this hospitalization (including imaging, microbiology, ancillary and laboratory) are listed below for reference.     Microbiology: Recent Results (from the past 240 hour(s))  Blood culture (routine x 2)     Status: None   Collection Time: 11/12/22  8:55 PM   Specimen: BLOOD  Result Value Ref Range Status   Specimen Description BLOOD SITE NOT SPECIFIED  Final   Special Requests   Final    BOTTLES DRAWN AEROBIC AND ANAEROBIC Blood Culture adequate volume   Culture   Final    NO GROWTH 5 DAYS Performed at [facility], [address] [place], [place] [address]    Report Status 11/17/2022 FINAL  Final  Blood culture (routine x 2)     Status: None (Preliminary result)   Collection Time: 11/13/22 10:41 AM   Specimen: BLOOD LEFT HAND  Result Value Ref Range Status   Specimen Description   Final    BLOOD LEFT HAND BOTTLES DRAWN AEROBIC AND ANAEROBIC Performed at [facility] [address], [place], [place] [address]    Special Requests   Final    Blood Culture results may not be optimal due to an excessive volume of blood received in culture bottles Performed at [facility] [address] [place], [place] [address]    Culture   Final    NO GROWTH 4 DAYS Performed at [facility], [place] [address] [place], [place] [address]    Report Status PENDING  Incomplete  MRSA Next Gen by PCR, Nasal     Status: Abnormal   Collection Time: 11/13/22 10:43 AM   Specimen: Nasal Mucosa; Nasal Swab  Result Value Ref Range Status   MRSA by PCR Next Gen DETECTED (A)  NOT DETECTED Final    Comment: (NOTE) The GeneXpert MRSA Assay (FDA approved for NASAL specimens only), is one component of a comprehensive MRSA colonization surveillance program. It is not intended to diagnose MRSA infection nor to guide or monitor treatment for MRSA infections. Test performance is not FDA approved in patients less than 41 years old. Performed at [facility], [place] [address] [place], [place] [address]      Labs:  CBC: Recent Labs  Lab 11/12/22 [phone]  11/14/22 0400 11/15/22 0401 11/16/22 0656 11/17/22 0549  WBC 22.7* 19.1* 15.7* 9.9 6.8  NEUTROABS  --   --  12.3*  --   --   HGB 12.2 10.6* 10.2* 11.2* 10.7*  HCT 39.1 31.2* 30.7* 34.8* 33.8*  MCV 93.8 84.1 86.5 86.6 87.8  PLT 375 253 177 173 137*   BMP &GFR Recent Labs  Lab 11/14/22 0400 11/14/22 0729 11/14/22 1425 11/14/22 1536 11/14/22 2341 11/15/22 0401 11/15/22 0744 11/16/22 0656 11/17/22 0549  NA 153*   < >  --    < > 140 142 138 133* 134*  K 3.9   < >  --    < > 3.5 3.6 3.9 4.8 4.5  CL 118*   < >  --    < > 108 111 107 104 106  CO2 29   < >  --    < > 25 24 24  20* 23  GLUCOSE 177*   < >  --    < > 196* 131* 286* 481* 329*  BUN 32*   < >  --    < > 21* 16 17 25* 23*  CREATININE 0.85   < >  --    < > 0.56 0.46 0.50 0.62 0.57  CALCIUM 8.2*   < >  --    < > 7.3* 7.5* 7.1* 7.9* 8.3*  MG 2.1  --   --   --   --  1.8  --  2.2 1.8  PHOS <1.0*  --  3.5  --   --  1.4*  --  3.0 3.2   < > = values in this interval not displayed.   Estimated Creatinine Clearance: 58.7 mL/min (by C-G formula based on SCr of 0.57 mg/dL). Liver & Pancreas: Recent Labs  Lab 11/12/22 1934 11/17/22 0549  AST 77*  --   ALT 82*  --   ALKPHOS 166*  --   BILITOT 1.8*  --   PROT 5.7*  --   ALBUMIN 2.5* 2.2*   No results for input(s): "LIPASE", "AMYLASE" in the last 168 hours. No results for input(s): "AMMONIA" in the last 168 hours. Diabetic: Recent Labs    11/15/22 0401  HGBA1C >15.5*  DISREGARD  RESULTS.  SEE [id number]. TESTING TO BE CONFIRMED AT [facility]. NOTIFIED BRITTANY CLAPP,RN AT [place] [address] 11/15/2022 BY ZBEECH.   Recent Labs  Lab 11/16/22 0756 11/16/22 1118 11/16/22 1623 11/16/22 2124 11/17/22 0749  GLUCAP 454* 276* 165* 116* 382*   Cardiac Enzymes: No results for input(s): "CKTOTAL", "CKMB", "CKMBINDEX", "TROPONINI" in the last 168 hours. No results for input(s): "PROBNP" in the last 8760 hours. Coagulation Profile: No results for input(s): "INR", "PROTIME" in the last 168 hours. Thyroid Function Tests: No results for input(s): "TSH", "T4TOTAL", "FREET4", "T3FREE", "THYROIDAB" in the last 72 hours. Lipid Profile: No results for input(s): "CHOL", "HDL", "LDLCALC", "TRIG", "CHOLHDL", "LDLDIRECT" in the last 72 hours. Anemia Panel: No results for input(s): "VITAMINB12", "FOLATE", "FERRITIN", "TIBC", "IRON", "RETICCTPCT" in the last 72 hours. Urine analysis:    Component Value Date/Time   COLORURINE YELLOW 11/12/2022 1937   APPEARANCEUR CLEAR 11/12/2022 1937   LABSPEC 1.022 11/12/2022 1937   PHURINE 5.0 11/12/2022 1937   GLUCOSEU >=500 (A) 11/12/2022 1937   HGBUR NEGATIVE 11/12/2022 1937   BILIRUBINUR NEGATIVE 11/12/2022 1937   KETONESUR 20 (A) 11/12/2022 1937   PROTEINUR 30 (A) 11/12/2022 1937   UROBILINOGEN 1.0 07/28/2015 1402   NITRITE NEGATIVE 11/12/2022 1937   LEUKOCYTESUR  NEGATIVE 11/12/2022 1937   Sepsis Labs: Invalid input(s): "PROCALCITONIN", "LACTICIDVEN"   SIGNED:  Mercy Riding, MD  [facility] 11/17/2022, 4:10 PM

## 2022-11-17 NOTE — Progress Notes (Signed)
Pt's home meds were retrieved from pharmacy and her IV's removed.  The room was empty at 1055.  She left without receiving discharge instructions and paperwork.  Pt had indicated she would not be leaving yet, but left abruptly.  Attending notified

## 2022-11-18 LAB — CULTURE, BLOOD (ROUTINE X 2): Culture: NO GROWTH

## 2022-11-27 ENCOUNTER — Other Ambulatory Visit: Payer: Self-pay

## 2022-11-27 ENCOUNTER — Emergency Department (HOSPITAL_COMMUNITY): Payer: Medicaid Other

## 2022-11-27 ENCOUNTER — Inpatient Hospital Stay (HOSPITAL_COMMUNITY)
Admission: EM | Admit: 2022-11-27 | Discharge: 2022-12-07 | DRG: 871 | Discharge status: Against Medical Advice | Payer: Medicaid Other | Attending: Internal Medicine | Admitting: Internal Medicine

## 2022-11-27 DIAGNOSIS — N179 Acute kidney failure, unspecified: Secondary | ICD-10-CM | POA: Diagnosis present

## 2022-11-27 DIAGNOSIS — R1312 Dysphagia, oropharyngeal phase: Secondary | ICD-10-CM | POA: Diagnosis not present

## 2022-11-27 DIAGNOSIS — E1043 Type 1 diabetes mellitus with diabetic autonomic (poly)neuropathy: Secondary | ICD-10-CM | POA: Diagnosis present

## 2022-11-27 DIAGNOSIS — E10649 Type 1 diabetes mellitus with hypoglycemia without coma: Secondary | ICD-10-CM | POA: Diagnosis not present

## 2022-11-27 DIAGNOSIS — F1410 Cocaine abuse, uncomplicated: Secondary | ICD-10-CM | POA: Diagnosis present

## 2022-11-27 DIAGNOSIS — I5022 Chronic systolic (congestive) heart failure: Secondary | ICD-10-CM | POA: Diagnosis present

## 2022-11-27 DIAGNOSIS — E1110 Type 2 diabetes mellitus with ketoacidosis without coma: Secondary | ICD-10-CM | POA: Diagnosis present

## 2022-11-27 DIAGNOSIS — E1010 Type 1 diabetes mellitus with ketoacidosis without coma: Secondary | ICD-10-CM | POA: Diagnosis present

## 2022-11-27 DIAGNOSIS — Z801 Family history of malignant neoplasm of trachea, bronchus and lung: Secondary | ICD-10-CM

## 2022-11-27 DIAGNOSIS — T68XXXA Hypothermia, initial encounter: Secondary | ICD-10-CM

## 2022-11-27 DIAGNOSIS — Z880 Allergy status to penicillin: Secondary | ICD-10-CM

## 2022-11-27 DIAGNOSIS — Z833 Family history of diabetes mellitus: Secondary | ICD-10-CM

## 2022-11-27 DIAGNOSIS — E876 Hypokalemia: Secondary | ICD-10-CM | POA: Diagnosis not present

## 2022-11-27 DIAGNOSIS — J80 Acute respiratory distress syndrome: Secondary | ICD-10-CM | POA: Diagnosis present

## 2022-11-27 DIAGNOSIS — K219 Gastro-esophageal reflux disease without esophagitis: Secondary | ICD-10-CM | POA: Diagnosis present

## 2022-11-27 DIAGNOSIS — H9191 Unspecified hearing loss, right ear: Secondary | ICD-10-CM | POA: Diagnosis present

## 2022-11-27 DIAGNOSIS — A4101 Sepsis due to Methicillin susceptible Staphylococcus aureus: Principal | ICD-10-CM | POA: Diagnosis present

## 2022-11-27 DIAGNOSIS — J189 Pneumonia, unspecified organism: Secondary | ICD-10-CM

## 2022-11-27 DIAGNOSIS — E870 Hyperosmolality and hypernatremia: Secondary | ICD-10-CM | POA: Diagnosis not present

## 2022-11-27 DIAGNOSIS — G9341 Metabolic encephalopathy: Secondary | ICD-10-CM | POA: Diagnosis present

## 2022-11-27 DIAGNOSIS — Z794 Long term (current) use of insulin: Secondary | ICD-10-CM

## 2022-11-27 DIAGNOSIS — F32A Depression, unspecified: Secondary | ICD-10-CM | POA: Diagnosis present

## 2022-11-27 DIAGNOSIS — I110 Hypertensive heart disease with heart failure: Secondary | ICD-10-CM | POA: Diagnosis present

## 2022-11-27 DIAGNOSIS — K7200 Acute and subacute hepatic failure without coma: Secondary | ICD-10-CM | POA: Diagnosis present

## 2022-11-27 DIAGNOSIS — Z79899 Other long term (current) drug therapy: Secondary | ICD-10-CM

## 2022-11-27 DIAGNOSIS — L89152 Pressure ulcer of sacral region, stage 2: Secondary | ICD-10-CM | POA: Diagnosis not present

## 2022-11-27 DIAGNOSIS — Z885 Allergy status to narcotic agent status: Secondary | ICD-10-CM

## 2022-11-27 DIAGNOSIS — E43 Unspecified severe protein-calorie malnutrition: Secondary | ICD-10-CM | POA: Diagnosis present

## 2022-11-27 DIAGNOSIS — R6521 Severe sepsis with septic shock: Secondary | ICD-10-CM | POA: Diagnosis present

## 2022-11-27 DIAGNOSIS — J15211 Pneumonia due to Methicillin susceptible Staphylococcus aureus: Secondary | ICD-10-CM | POA: Diagnosis present

## 2022-11-27 DIAGNOSIS — Z5329 Procedure and treatment not carried out because of patient's decision for other reasons: Secondary | ICD-10-CM | POA: Diagnosis present

## 2022-11-27 DIAGNOSIS — Z8249 Family history of ischemic heart disease and other diseases of the circulatory system: Secondary | ICD-10-CM

## 2022-11-27 DIAGNOSIS — L89312 Pressure ulcer of right buttock, stage 2: Secondary | ICD-10-CM | POA: Diagnosis not present

## 2022-11-27 DIAGNOSIS — Z1152 Encounter for screening for COVID-19: Secondary | ICD-10-CM

## 2022-11-27 DIAGNOSIS — F17210 Nicotine dependence, cigarettes, uncomplicated: Secondary | ICD-10-CM | POA: Diagnosis present

## 2022-11-27 DIAGNOSIS — Z681 Body mass index (BMI) 19 or less, adult: Secondary | ICD-10-CM

## 2022-11-27 DIAGNOSIS — K3184 Gastroparesis: Secondary | ICD-10-CM | POA: Diagnosis present

## 2022-11-27 DIAGNOSIS — Z781 Physical restraint status: Secondary | ICD-10-CM

## 2022-11-27 DIAGNOSIS — F419 Anxiety disorder, unspecified: Secondary | ICD-10-CM | POA: Diagnosis present

## 2022-11-27 DIAGNOSIS — D6959 Other secondary thrombocytopenia: Secondary | ICD-10-CM | POA: Diagnosis present

## 2022-11-27 DIAGNOSIS — E1011 Type 1 diabetes mellitus with ketoacidosis with coma: Principal | ICD-10-CM

## 2022-11-27 DIAGNOSIS — R64 Cachexia: Secondary | ICD-10-CM | POA: Diagnosis present

## 2022-11-27 DIAGNOSIS — R7401 Elevation of levels of liver transaminase levels: Secondary | ICD-10-CM

## 2022-11-27 LAB — I-STAT CHEM 8, ED
BUN: 35 mg/dL — ABNORMAL HIGH (ref 6–20)
Calcium, Ion: 1.29 mmol/L (ref 1.15–1.40)
Chloride: 115 mmol/L — ABNORMAL HIGH (ref 98–111)
Creatinine, Ser: 1 mg/dL (ref 0.44–1.00)
Glucose, Bld: >700 mg/dL (ref 70–99)
HCT: 36 % (ref 36.0–46.0)
Hemoglobin: 12.2 g/dL (ref 12.0–15.0)
Potassium: 4.9 mmol/L (ref 3.5–5.1)
Sodium: 141 mmol/L (ref 135–145)
TCO2: 7 mmol/L — ABNORMAL LOW (ref 22–32)

## 2022-11-27 LAB — COMPREHENSIVE METABOLIC PANEL
ALT: 1121 U/L — ABNORMAL HIGH (ref 0–44)
AST: 105 U/L — ABNORMAL HIGH (ref 15–41)
Albumin: 2.1 g/dL — ABNORMAL LOW (ref 3.5–5.0)
Alkaline Phosphatase: 202 U/L — ABNORMAL HIGH (ref 38–126)
BUN: 37 mg/dL — ABNORMAL HIGH (ref 6–20)
CO2: <7 mmol/L — ABNORMAL LOW (ref 22–32)
Calcium: 9 mg/dL (ref 8.9–10.3)
Chloride: 104 mmol/L (ref 98–111)
Creatinine, Ser: 1.89 mg/dL — ABNORMAL HIGH (ref 0.44–1.00)
GFR, Estimated: 34 mL/min — ABNORMAL LOW (ref >60)
Glucose, Bld: 891 mg/dL (ref 70–99)
Potassium: 4.8 mmol/L (ref 3.5–5.1)
Sodium: 140 mmol/L (ref 135–145)
Total Bilirubin: 2.1 mg/dL — ABNORMAL HIGH (ref 0.3–1.2)
Total Protein: 4.9 g/dL — ABNORMAL LOW (ref 6.5–8.1)

## 2022-11-27 LAB — CBC WITH DIFFERENTIAL/PLATELET

## 2022-11-27 LAB — I-STAT VENOUS BLOOD GAS, ED
Acid-base deficit: 27 mmol/L — ABNORMAL HIGH (ref 0.0–2.0)
Bicarbonate: 3.8 mmol/L — ABNORMAL LOW (ref 20.0–28.0)
Calcium, Ion: 1.27 mmol/L (ref 1.15–1.40)
HCT: 35 % — ABNORMAL LOW (ref 36.0–46.0)
Hemoglobin: 11.9 g/dL — ABNORMAL LOW (ref 12.0–15.0)
O2 Saturation: 74 %
Potassium: 4.9 mmol/L (ref 3.5–5.1)
Sodium: 139 mmol/L (ref 135–145)
TCO2: <5 mmol/L — ABNORMAL LOW (ref 22–32)
pCO2, Ven: 18 mmHg — CL (ref 44–60)
pH, Ven: 6.927 — CL (ref 7.25–7.43)
pO2, Ven: 62 mmHg — ABNORMAL HIGH (ref 32–45)

## 2022-11-27 LAB — LACTIC ACID, PLASMA: Lactic Acid, Venous: 3.8 mmol/L (ref 0.5–1.9)

## 2022-11-27 LAB — CBG MONITORING, ED: Glucose-Capillary: >600 mg/dL (ref 70–99)

## 2022-11-27 MED ORDER — LACTATED RINGERS IV SOLN: INTRAVENOUS | Status: DC

## 2022-11-27 MED ORDER — SODIUM CHLORIDE 0.9 % IV BOLUS
1000.0000 mL | Freq: Once | INTRAVENOUS | Status: AC
  Administered 2022-11-27: 1000 mL via INTRAVENOUS

## 2022-11-27 MED ORDER — LACTATED RINGERS IV BOLUS
1000.0000 mL | Freq: Once | INTRAVENOUS | Status: AC
  Administered 2022-11-27: 1000 mL via INTRAVENOUS

## 2022-11-27 MED ORDER — POTASSIUM CHLORIDE 10 MEQ/100ML IV SOLN
10.0000 meq | INTRAVENOUS | Status: DC
  Administered 2022-11-27 – 2022-11-28 (×2): 10 meq via INTRAVENOUS
  Filled 2022-11-27 (×2): qty 100

## 2022-11-27 MED ORDER — NALOXONE HCL 0.4 MG/ML IJ SOLN
0.4000 mg | Freq: Once | INTRAMUSCULAR | Status: DC
  Filled 2022-11-27: qty 1

## 2022-11-27 MED ORDER — DEXTROSE 50 % IV SOLN: 0.0000 mL | INTRAVENOUS | Status: DC | PRN

## 2022-11-27 MED ORDER — SODIUM CHLORIDE 0.9 % IV SOLN: 500.0000 mg | INTRAVENOUS | Status: DC

## 2022-11-27 MED ORDER — LACTATED RINGERS IV BOLUS: 20.0000 mL/kg | Freq: Once | INTRAVENOUS | Status: DC

## 2022-11-27 MED ORDER — DEXTROSE IN LACTATED RINGERS 5 % IV SOLN: INTRAVENOUS | Status: DC

## 2022-11-27 MED ORDER — SODIUM BICARBONATE 8.4 % IV SOLN: 50.0000 meq | Freq: Once | INTRAVENOUS | Status: AC

## 2022-11-27 MED ORDER — SODIUM BICARBONATE 8.4 % IV SOLN
INTRAVENOUS | Status: AC
  Administered 2022-11-27: 50 meq via INTRAVENOUS
  Filled 2022-11-27: qty 50

## 2022-11-27 MED ORDER — SODIUM CHLORIDE 0.9 % IV SOLN
1.0000 g | Freq: Once | INTRAVENOUS | Status: AC
  Administered 2022-11-28: 1 g via INTRAVENOUS
  Filled 2022-11-27: qty 10

## 2022-11-27 MED ORDER — INSULIN REGULAR(HUMAN) IN NACL 100-0.9 UT/100ML-% IV SOLN
INTRAVENOUS | Status: DC
  Administered 2022-11-28: 4 [IU]/h via INTRAVENOUS
  Filled 2022-11-27: qty 100

## 2022-11-27 MED ORDER — NALOXONE HCL 0.4 MG/ML IJ SOLN
INTRAMUSCULAR | Status: AC
  Filled 2022-11-27: qty 1

## 2022-11-27 MED ORDER — INSULIN ASPART 100 UNIT/ML IJ SOLN
5.0000 [IU] | Freq: Once | INTRAMUSCULAR | Status: AC
  Administered 2022-11-27: 5 [IU] via SUBCUTANEOUS

## 2022-11-27 NOTE — ED Triage Notes (Signed)
Patient BIB [facility] for hyperglycemia and unresponsiveness. Friends called out for unresponsiveness, was worried CBG was low. EMS arrived and checked sugar and it read "High" which means greater than 600. Patient is now responsive to painful stimuli. Nasal trumpet and c-collar in place from EMS for airway. 18g noted to L AC, patient has received about 747m of NS.

## 2022-11-27 NOTE — ED Provider Notes (Signed)
[facility] Emergency Department Provider Note MRN:  [id number]  Arrival date & time: 11/28/22     Chief Complaint   Hyperglycemia   History of Present Illness   [name] is a 37 y.o. year-old female presents to the ED with chief complaint of AMS and hyperglycemia.  Hx of DKA.  DM1.  Recent admission to ICU for the same.  Hx of cocaine abuse.  BIB EMS, who gave 767m NS with mild improvement in responsiveness.  Report from friend is that she hasn't been eating since here last admission.  History provided by patient.   Review of Systems  Pertinent positive and negative review of systems noted in HPI.    Physical Exam   Vitals:   11/27/22 2320 11/27/22 2330  BP:  93/63  Pulse: 68 68  Resp: 16 12  Temp:  (!) 88 F (31.1 C)  SpO2: 91% (!) 89%    CONSTITUTIONAL:  critically ill-appearing, NAD NEURO:  GCS 7 EYES:  eyes equal and sluggishly reactive ENT/NECK:  Supple, no stridor  CARDIO:  normal rate, regular rhythm, appears well-perfused  PULM:  Kussmaul breathing GI/GU:  non-distended MSK/SPINE:  No gross deformities, no edema SKIN:  no rash, atraumatic   *Additional and/or pertinent findings included in MDM below  Diagnostic and Interventional Summary    EKG Interpretation  Date/Time:    Ventricular Rate:    PR Interval:    QRS Duration:   QT Interval:    QTC Calculation:   R Axis:     Text Interpretation:         Labs Reviewed  CBC WITH DIFFERENTIAL/PLATELET - Abnormal; Notable for the following components:      Result Value   WBC 16.6 (*)    RBC 3.57 (*)    Hemoglobin 10.7 (*)    HCT 34.2 (*)    RDW 18.6 (*)    nRBC 1.3 (*)    All other components within normal limits  COMPREHENSIVE METABOLIC PANEL - Abnormal; Notable for the following components:   CO2 <7 (*)    Glucose, Bld 891 (*)    BUN 37 (*)    Creatinine, Ser 1.89 (*)    Total Protein 4.9 (*)    Albumin 2.1 (*)    AST 105 (*)    ALT 1,121 (*)     Alkaline Phosphatase 202 (*)    Total Bilirubin 2.1 (*)    GFR, Estimated 34 (*)    All other components within normal limits  LACTIC ACID, PLASMA - Abnormal; Notable for the following components:   Lactic Acid, Venous 3.8 (*)    All other components within normal limits  CBG MONITORING, ED - Abnormal; Notable for the following components:   Glucose-Capillary >600 (*)    All other components within normal limits  I-STAT CHEM 8, ED - Abnormal; Notable for the following components:   Chloride 115 (*)    BUN 35 (*)    Glucose, Bld >700 (*)    TCO2 7 (*)    All other components within normal limits  I-STAT VENOUS BLOOD GAS, ED - Abnormal; Notable for the following components:   pH, Ven 6.927 (*)    pCO2, Ven 18.0 (*)    pO2, Ven 62 (*)    Bicarbonate 3.8 (*)    TCO2 <5 (*)    Acid-base deficit 27.0 (*)    HCT 35.0 (*)    Hemoglobin 11.9 (*)    All other components within normal  limits  RESP PANEL BY RT-PCR (RSV, FLU A&B, COVID)  RVPGX2  BLOOD GAS, VENOUS  LACTIC ACID, PLASMA  RAPID URINE DRUG SCREEN, HOSP PERFORMED  URINALYSIS, ROUTINE W REFLEX MICROSCOPIC  BETA-HYDROXYBUTYRIC ACID  BETA-HYDROXYBUTYRIC ACID  BETA-HYDROXYBUTYRIC ACID  CBG MONITORING, ED  I-STAT BETA HCG BLOOD, ED (MC, WL, AP ONLY)    DG Chest Port 1 View  Final Result      Medications  naloxone [facility]) injection 0.4 mg (0 mg Intravenous Hold 11/27/22 2311)  lactated ringers bolus 780 mL (has no administration in time range)  insulin regular, human (MYXREDLIN) 100 units/ 100 mL infusion (has no administration in time range)  lactated ringers infusion ( Intravenous Not Given 11/27/22 2326)  dextrose 5 % in lactated ringers infusion (0 mLs Intravenous Hold 11/27/22 2310)  dextrose 50 % solution 0-50 mL (has no administration in time range)  potassium chloride 10 mEq in 100 mL IVPB (10 mEq Intravenous New Bag/Given 11/27/22 2343)  azithromycin (ZITHROMAX) 500 mg in sodium chloride 0.9 % 250 mL IVPB (has no  administration in time range)  cefTRIAXone (ROCEPHIN) 1 g in sodium chloride 0.9 % 100 mL IVPB (has no administration in time range)  lactated ringers bolus 1,000 mL (1,000 mLs Intravenous New Bag/Given 11/27/22 2317)  insulin aspart (novoLOG) injection 5 Units (5 Units Subcutaneous Given 11/27/22 2306)  sodium bicarbonate injection 50 mEq (50 mEq Intravenous Given 11/27/22 2304)  sodium chloride 0.9 % bolus 1,000 mL (1,000 mLs Intravenous New Bag/Given 11/27/22 2304)  sodium chloride 0.9 % bolus 1,000 mL (1,000 mLs Intravenous New Bag/Given 11/27/22 2303)     Procedures  /  Critical Care .Critical Care  Performed by: Montine Circle, PA-C Authorized by: Montine Circle, PA-C   Critical care provider statement:    Critical care time (minutes):  80   Critical care was necessary to treat or prevent imminent or life-threatening deterioration of the following conditions:  Metabolic crisis   Critical care was time spent personally by me on the following activities:  Development of treatment plan with patient or surrogate, discussions with consultants, evaluation of patient's response to treatment, examination of patient, ordering and review of laboratory studies, ordering and review of radiographic studies, ordering and performing treatments and interventions, pulse oximetry, re-evaluation of patient's condition and review of old charts   ED Course and Medical Decision Making  I have reviewed the triage vital signs, the nursing notes, and pertinent available records from the EMR.  Social Determinants Affecting Complexity of Care: Patient suffers from drug abuse/addiction.   ED Course:    Medical Decision Making Patient here with AMS, thought to be encephalopathy 2/2 DKA.  Report from EMS is that the patient hasn't been eating at all since last discharge.  Hx of drug use.  Initial CBG "high."  Unresponsive.  Seen immediately by me and Dr. Darl Householder. Dr. Darl Householder recommends 1 amp bicarb and starting  fluids.     Problems Addressed: Community acquired pneumonia, unspecified laterality: acute illness or injury with systemic symptoms Diabetic ketoacidosis with coma associated with type 1 diabetes mellitus ([place]): acute illness or injury that poses a threat to life or bodily functions Elevated transaminase level: undiagnosed new problem with uncertain prognosis Hypothermia, initial encounter: acute illness or injury that poses a threat to life or bodily functions  Amount and/or Complexity of Data Reviewed Labs: ordered.    Details: K 4.9, supplement per DKA order set Glucose >700, getting fluids and insulin Venous pH 6.927, given 1 amp bicarb Leukocytosis  16.6, possibly pneumonia, getting rocephin and azithro Radiology: ordered and independent interpretation performed.    Details: Infiltrates concerning for pneumonia  Risk Prescription drug management. Decision regarding hospitalization.     Consultants: I discussed the case with Dr. Duwayne Heck, who is appreciated for admitting.   Treatment and Plan: Patient's exam and diagnostic results are concerning for DKA with encephalopathy and acidosis.  Feel that patient will need admission to the hospital for further treatment and evaluation.  Patient seen by and discussed with attending physician, Dr. Betsey Holiday and Darl Householder, who agree with treatment plan.  Final Clinical Impressions(s) / ED Diagnoses     ICD-10-CM   1. Diabetic ketoacidosis with coma associated with type 1 diabetes mellitus (HCC)  E10.11     2. Hypothermia, initial encounter  T68.XXXA     3. Elevated transaminase level  R74.01     4. Community acquired pneumonia, unspecified laterality  J18.9       ED Discharge Orders     None         Discharge Instructions Discussed with and Provided to Patient:   Discharge Instructions   None      Montine Circle, PA-C 11/28/22 0006    Orpah Greek, MD 11/29/22 [phone]

## 2022-11-28 ENCOUNTER — Inpatient Hospital Stay (HOSPITAL_COMMUNITY): Payer: Medicaid Other

## 2022-11-28 DIAGNOSIS — L89152 Pressure ulcer of sacral region, stage 2: Secondary | ICD-10-CM | POA: Diagnosis not present

## 2022-11-28 DIAGNOSIS — E1010 Type 1 diabetes mellitus with ketoacidosis without coma: Secondary | ICD-10-CM

## 2022-11-28 DIAGNOSIS — E1011 Type 1 diabetes mellitus with ketoacidosis with coma: Secondary | ICD-10-CM

## 2022-11-28 DIAGNOSIS — I110 Hypertensive heart disease with heart failure: Secondary | ICD-10-CM | POA: Diagnosis present

## 2022-11-28 DIAGNOSIS — R7401 Elevation of levels of liver transaminase levels: Secondary | ICD-10-CM | POA: Diagnosis not present

## 2022-11-28 DIAGNOSIS — Z1152 Encounter for screening for COVID-19: Secondary | ICD-10-CM | POA: Diagnosis not present

## 2022-11-28 DIAGNOSIS — E1110 Type 2 diabetes mellitus with ketoacidosis without coma: Secondary | ICD-10-CM | POA: Diagnosis present

## 2022-11-28 DIAGNOSIS — J80 Acute respiratory distress syndrome: Secondary | ICD-10-CM | POA: Diagnosis present

## 2022-11-28 DIAGNOSIS — E10649 Type 1 diabetes mellitus with hypoglycemia without coma: Secondary | ICD-10-CM | POA: Diagnosis not present

## 2022-11-28 DIAGNOSIS — R64 Cachexia: Secondary | ICD-10-CM | POA: Diagnosis present

## 2022-11-28 DIAGNOSIS — K7200 Acute and subacute hepatic failure without coma: Secondary | ICD-10-CM | POA: Diagnosis present

## 2022-11-28 DIAGNOSIS — G9341 Metabolic encephalopathy: Secondary | ICD-10-CM | POA: Diagnosis present

## 2022-11-28 DIAGNOSIS — J15211 Pneumonia due to Methicillin susceptible Staphylococcus aureus: Secondary | ICD-10-CM | POA: Diagnosis present

## 2022-11-28 DIAGNOSIS — Z681 Body mass index (BMI) 19 or less, adult: Secondary | ICD-10-CM | POA: Diagnosis not present

## 2022-11-28 DIAGNOSIS — E1043 Type 1 diabetes mellitus with diabetic autonomic (poly)neuropathy: Secondary | ICD-10-CM | POA: Diagnosis present

## 2022-11-28 DIAGNOSIS — D6959 Other secondary thrombocytopenia: Secondary | ICD-10-CM | POA: Diagnosis present

## 2022-11-28 DIAGNOSIS — F1410 Cocaine abuse, uncomplicated: Secondary | ICD-10-CM | POA: Diagnosis present

## 2022-11-28 DIAGNOSIS — I5022 Chronic systolic (congestive) heart failure: Secondary | ICD-10-CM | POA: Diagnosis present

## 2022-11-28 DIAGNOSIS — R6521 Severe sepsis with septic shock: Secondary | ICD-10-CM | POA: Diagnosis present

## 2022-11-28 DIAGNOSIS — N179 Acute kidney failure, unspecified: Secondary | ICD-10-CM | POA: Diagnosis present

## 2022-11-28 DIAGNOSIS — I272 Pulmonary hypertension, unspecified: Secondary | ICD-10-CM | POA: Diagnosis not present

## 2022-11-28 DIAGNOSIS — Z794 Long term (current) use of insulin: Secondary | ICD-10-CM | POA: Diagnosis not present

## 2022-11-28 DIAGNOSIS — L89312 Pressure ulcer of right buttock, stage 2: Secondary | ICD-10-CM | POA: Diagnosis not present

## 2022-11-28 DIAGNOSIS — J189 Pneumonia, unspecified organism: Secondary | ICD-10-CM

## 2022-11-28 DIAGNOSIS — A4101 Sepsis due to Methicillin susceptible Staphylococcus aureus: Secondary | ICD-10-CM | POA: Diagnosis present

## 2022-11-28 DIAGNOSIS — F32A Depression, unspecified: Secondary | ICD-10-CM | POA: Diagnosis present

## 2022-11-28 DIAGNOSIS — E870 Hyperosmolality and hypernatremia: Secondary | ICD-10-CM | POA: Diagnosis not present

## 2022-11-28 DIAGNOSIS — A419 Sepsis, unspecified organism: Secondary | ICD-10-CM | POA: Diagnosis not present

## 2022-11-28 DIAGNOSIS — E43 Unspecified severe protein-calorie malnutrition: Secondary | ICD-10-CM | POA: Diagnosis present

## 2022-11-28 LAB — BODY FLUID CELL COUNT WITH DIFFERENTIAL
Eos, Fluid: 0 %
Lymphs, Fluid: 4 %
Monocyte-Macrophage-Serous Fluid: 0 % — ABNORMAL LOW (ref 50–90)
Neutrophil Count, Fluid: 96 % — ABNORMAL HIGH (ref 0–25)
Total Nucleated Cell Count, Fluid: 54 cu mm (ref 0–1000)

## 2022-11-28 LAB — POCT I-STAT 7, (LYTES, BLD GAS, ICA,H+H)
Acid-Base Excess: 0 mmol/L (ref 0.0–2.0)
Acid-Base Excess: 0 mmol/L (ref 0.0–2.0)
Acid-Base Excess: 1 mmol/L (ref 0.0–2.0)
Acid-Base Excess: 1 mmol/L (ref 0.0–2.0)
Acid-base deficit: 4 mmol/L — ABNORMAL HIGH (ref 0.0–2.0)
Bicarbonate: 23.1 mmol/L (ref 20.0–28.0)
Bicarbonate: 26.8 mmol/L (ref 20.0–28.0)
Bicarbonate: 28.5 mmol/L — ABNORMAL HIGH (ref 20.0–28.0)
Bicarbonate: 28.5 mmol/L — ABNORMAL HIGH (ref 20.0–28.0)
Bicarbonate: 28.8 mmol/L — ABNORMAL HIGH (ref 20.0–28.0)
Calcium, Ion: 1.35 mmol/L (ref 1.15–1.40)
Calcium, Ion: 1.29 mmol/L (ref 1.15–1.40)
Calcium, Ion: 1.31 mmol/L (ref 1.15–1.40)
Calcium, Ion: 1.32 mmol/L (ref 1.15–1.40)
Calcium, Ion: 1.32 mmol/L (ref 1.15–1.40)
HCT: 32 % — ABNORMAL LOW (ref 36.0–46.0)
HCT: 30 % — ABNORMAL LOW (ref 36.0–46.0)
HCT: 31 % — ABNORMAL LOW (ref 36.0–46.0)
HCT: 30 % — ABNORMAL LOW (ref 36.0–46.0)
HCT: 30 % — ABNORMAL LOW (ref 36.0–46.0)
Hemoglobin: 10.9 g/dL — ABNORMAL LOW (ref 12.0–15.0)
Hemoglobin: 10.2 g/dL — ABNORMAL LOW (ref 12.0–15.0)
Hemoglobin: 10.5 g/dL — ABNORMAL LOW (ref 12.0–15.0)
Hemoglobin: 10.2 g/dL — ABNORMAL LOW (ref 12.0–15.0)
Hemoglobin: 10.2 g/dL — ABNORMAL LOW (ref 12.0–15.0)
O2 Saturation: 98 %
O2 Saturation: 98 %
O2 Saturation: 96 %
O2 Saturation: 100 %
O2 Saturation: 99 %
Patient temperature: 37.3
Patient temperature: 32.4
Patient temperature: 37.5
Patient temperature: 37
Patient temperature: 36.7
Potassium: 3.6 mmol/L (ref 3.5–5.1)
Potassium: 4.2 mmol/L (ref 3.5–5.1)
Potassium: 3.9 mmol/L (ref 3.5–5.1)
Potassium: 3.8 mmol/L (ref 3.5–5.1)
Potassium: 3.6 mmol/L (ref 3.5–5.1)
Sodium: 152 mmol/L — ABNORMAL HIGH (ref 135–145)
Sodium: 150 mmol/L — ABNORMAL HIGH (ref 135–145)
Sodium: 150 mmol/L — ABNORMAL HIGH (ref 135–145)
Sodium: 151 mmol/L — ABNORMAL HIGH (ref 135–145)
Sodium: 151 mmol/L — ABNORMAL HIGH (ref 135–145)
TCO2: 25 mmol/L (ref 22–32)
TCO2: 28 mmol/L (ref 22–32)
TCO2: 30 mmol/L (ref 22–32)
TCO2: 30 mmol/L (ref 22–32)
TCO2: 31 mmol/L (ref 22–32)
pCO2 arterial: 54 mmHg — ABNORMAL HIGH (ref 32–48)
pCO2 arterial: 45.1 mmHg (ref 32–48)
pCO2 arterial: 67.8 mmHg (ref 32–48)
pCO2 arterial: 63.3 mmHg — ABNORMAL HIGH (ref 32–48)
pCO2 arterial: 60.1 mmHg — ABNORMAL HIGH (ref 32–48)
pH, Arterial: 7.241 — ABNORMAL LOW (ref 7.35–7.45)
pH, Arterial: 7.359 (ref 7.35–7.45)
pH, Arterial: 7.235 — ABNORMAL LOW (ref 7.35–7.45)
pH, Arterial: 7.261 — ABNORMAL LOW (ref 7.35–7.45)
pH, Arterial: 7.287 — ABNORMAL LOW (ref 7.35–7.45)
pO2, Arterial: 127 mmHg — ABNORMAL HIGH (ref 83–108)
pO2, Arterial: 92 mmHg (ref 83–108)
pO2, Arterial: 105 mmHg (ref 83–108)
pO2, Arterial: 253 mmHg — ABNORMAL HIGH (ref 83–108)
pO2, Arterial: 162 mmHg — ABNORMAL HIGH (ref 83–108)

## 2022-11-28 LAB — CBC WITH DIFFERENTIAL/PLATELET
Abs Immature Granulocytes: 0.29 K/uL — ABNORMAL HIGH (ref 0.00–0.07)
Basophils Absolute: 0.1 K/uL (ref 0.0–0.1)
Basophils Relative: 0 %
Eosinophils Absolute: 0 K/uL (ref 0.0–0.5)
Eosinophils Relative: 0 %
HCT: 34.2 % — ABNORMAL LOW (ref 36.0–46.0)
Hemoglobin: 10.7 g/dL — ABNORMAL LOW (ref 12.0–15.0)
Immature Granulocytes: 2 %
Lymphocytes Relative: 8 %
Lymphs Abs: 1.3 K/uL (ref 0.7–4.0)
MCH: 30 pg (ref 26.0–34.0)
MCHC: 31.3 g/dL (ref 30.0–36.0)
MCV: 95.8 fL (ref 80.0–100.0)
Monocytes Absolute: 0.3 K/uL (ref 0.1–1.0)
Monocytes Relative: 2 %
Neutro Abs: 14.6 K/uL — ABNORMAL HIGH (ref 1.7–7.7)
Neutrophils Relative %: 88 %
Platelets: 152 K/uL (ref 150–400)
RBC: 3.57 MIL/uL — ABNORMAL LOW (ref 3.87–5.11)
RDW: 18.6 % — ABNORMAL HIGH (ref 11.5–15.5)
WBC: 16.6 K/uL — ABNORMAL HIGH (ref 4.0–10.5)
nRBC: 1.3 % — ABNORMAL HIGH (ref 0.0–0.2)

## 2022-11-28 LAB — RESPIRATORY PANEL BY PCR

## 2022-11-28 LAB — I-STAT BETA HCG BLOOD, ED (MC, WL, AP ONLY): I-stat hCG, quantitative: <5 m[IU]/mL (ref <5)

## 2022-11-28 LAB — I-STAT ARTERIAL BLOOD GAS, ED
Acid-base deficit: 26 mmol/L — ABNORMAL HIGH (ref 0.0–2.0)
Bicarbonate: 5.5 mmol/L — ABNORMAL LOW (ref 20.0–28.0)
Calcium, Ion: 1.33 mmol/L (ref 1.15–1.40)
HCT: 34 % — ABNORMAL LOW (ref 36.0–46.0)
Hemoglobin: 11.6 g/dL — ABNORMAL LOW (ref 12.0–15.0)
O2 Saturation: 68 %
Patient temperature: 94
Potassium: 4.1 mmol/L (ref 3.5–5.1)
Sodium: 144 mmol/L (ref 135–145)
TCO2: 6 mmol/L — ABNORMAL LOW (ref 22–32)
pCO2 arterial: 23.6 mmHg — ABNORMAL LOW (ref 32–48)
pH, Arterial: 6.954 — CL (ref 7.35–7.45)
pO2, Arterial: 48 mmHg — ABNORMAL LOW (ref 83–108)

## 2022-11-28 LAB — GLUCOSE, CAPILLARY
Glucose-Capillary: 196 mg/dL — ABNORMAL HIGH (ref 70–99)
Glucose-Capillary: 200 mg/dL — ABNORMAL HIGH (ref 70–99)
Glucose-Capillary: 204 mg/dL — ABNORMAL HIGH (ref 70–99)
Glucose-Capillary: 215 mg/dL — ABNORMAL HIGH (ref 70–99)
Glucose-Capillary: 235 mg/dL — ABNORMAL HIGH (ref 70–99)
Glucose-Capillary: 236 mg/dL — ABNORMAL HIGH (ref 70–99)
Glucose-Capillary: 283 mg/dL — ABNORMAL HIGH (ref 70–99)
Glucose-Capillary: 330 mg/dL — ABNORMAL HIGH (ref 70–99)
Glucose-Capillary: 341 mg/dL — ABNORMAL HIGH (ref 70–99)
Glucose-Capillary: 365 mg/dL — ABNORMAL HIGH (ref 70–99)
Glucose-Capillary: 370 mg/dL — ABNORMAL HIGH (ref 70–99)
Glucose-Capillary: 415 mg/dL — ABNORMAL HIGH (ref 70–99)
Glucose-Capillary: 416 mg/dL — ABNORMAL HIGH (ref 70–99)
Glucose-Capillary: 435 mg/dL — ABNORMAL HIGH (ref 70–99)
Glucose-Capillary: 467 mg/dL — ABNORMAL HIGH (ref 70–99)
Glucose-Capillary: 475 mg/dL — ABNORMAL HIGH (ref 70–99)
Glucose-Capillary: 479 mg/dL — ABNORMAL HIGH (ref 70–99)
Glucose-Capillary: 494 mg/dL — ABNORMAL HIGH (ref 70–99)
Glucose-Capillary: 503 mg/dL (ref 70–99)
Glucose-Capillary: 549 mg/dL (ref 70–99)
Glucose-Capillary: 580 mg/dL (ref 70–99)
Glucose-Capillary: >600 mg/dL (ref 70–99)
Glucose-Capillary: >600 mg/dL (ref 70–99)
Glucose-Capillary: >600 mg/dL (ref 70–99)

## 2022-11-28 LAB — HEPATIC FUNCTION PANEL
ALT: 820 U/L — ABNORMAL HIGH (ref 0–44)
AST: 106 U/L — ABNORMAL HIGH (ref 15–41)
Albumin: 1.7 g/dL — ABNORMAL LOW (ref 3.5–5.0)
Alkaline Phosphatase: 174 U/L — ABNORMAL HIGH (ref 38–126)
Bilirubin, Direct: 0.1 mg/dL (ref 0.0–0.2)
Indirect Bilirubin: 0.8 mg/dL (ref 0.3–0.9)
Total Bilirubin: 0.9 mg/dL (ref 0.3–1.2)
Total Protein: 4.3 g/dL — ABNORMAL LOW (ref 6.5–8.1)

## 2022-11-28 LAB — BASIC METABOLIC PANEL
Anion gap: 23 — ABNORMAL HIGH (ref 5–15)
Anion gap: 11 (ref 5–15)
Anion gap: 8 (ref 5–15)
BUN: 33 mg/dL — ABNORMAL HIGH (ref 6–20)
BUN: 30 mg/dL — ABNORMAL HIGH (ref 6–20)
BUN: 28 mg/dL — ABNORMAL HIGH (ref 6–20)
BUN: 27 mg/dL — ABNORMAL HIGH (ref 6–20)
CO2: <7 mmol/L — ABNORMAL LOW (ref 22–32)
CO2: 14 mmol/L — ABNORMAL LOW (ref 22–32)
CO2: 22 mmol/L (ref 22–32)
CO2: 27 mmol/L (ref 22–32)
Calcium: 8.1 mg/dL — ABNORMAL LOW (ref 8.9–10.3)
Calcium: 8.1 mg/dL — ABNORMAL LOW (ref 8.9–10.3)
Calcium: 8.1 mg/dL — ABNORMAL LOW (ref 8.9–10.3)
Calcium: 7.8 mg/dL — ABNORMAL LOW (ref 8.9–10.3)
Chloride: 110 mmol/L (ref 98–111)
Chloride: 110 mmol/L (ref 98–111)
Chloride: 113 mmol/L — ABNORMAL HIGH (ref 98–111)
Chloride: 112 mmol/L — ABNORMAL HIGH (ref 98–111)
Creatinine, Ser: 1.62 mg/dL — ABNORMAL HIGH (ref 0.44–1.00)
Creatinine, Ser: 1.37 mg/dL — ABNORMAL HIGH (ref 0.44–1.00)
Creatinine, Ser: 1.11 mg/dL — ABNORMAL HIGH (ref 0.44–1.00)
Creatinine, Ser: 1.08 mg/dL — ABNORMAL HIGH (ref 0.44–1.00)
GFR, Estimated: 41 mL/min — ABNORMAL LOW (ref >60)
GFR, Estimated: 51 mL/min — ABNORMAL LOW (ref >60)
GFR, Estimated: >60 mL/min (ref >60)
GFR, Estimated: >60 mL/min (ref >60)
Glucose, Bld: 743 mg/dL (ref 70–99)
Glucose, Bld: 560 mg/dL (ref 70–99)
Glucose, Bld: 395 mg/dL — ABNORMAL HIGH (ref 70–99)
Glucose, Bld: 332 mg/dL — ABNORMAL HIGH (ref 70–99)
Potassium: 4.2 mmol/L (ref 3.5–5.1)
Potassium: 3.4 mmol/L — ABNORMAL LOW (ref 3.5–5.1)
Potassium: 3.5 mmol/L (ref 3.5–5.1)
Potassium: 4 mmol/L (ref 3.5–5.1)
Sodium: 143 mmol/L (ref 135–145)
Sodium: 147 mmol/L — ABNORMAL HIGH (ref 135–145)
Sodium: 146 mmol/L — ABNORMAL HIGH (ref 135–145)
Sodium: 147 mmol/L — ABNORMAL HIGH (ref 135–145)

## 2022-11-28 LAB — URINALYSIS, ROUTINE W REFLEX MICROSCOPIC
Bilirubin Urine: NEGATIVE
Glucose, UA: >=500 mg/dL — AB
Ketones, ur: 20 mg/dL — AB
Nitrite: NEGATIVE
Protein, ur: 30 mg/dL — AB
Specific Gravity, Urine: 1.017 (ref 1.005–1.030)
pH: 5 (ref 5.0–8.0)

## 2022-11-28 LAB — BETA-HYDROXYBUTYRIC ACID
Beta-Hydroxybutyric Acid: >8 mmol/L — ABNORMAL HIGH (ref 0.05–0.27)
Beta-Hydroxybutyric Acid: >8 mmol/L — ABNORMAL HIGH (ref 0.05–0.27)
Beta-Hydroxybutyric Acid: 0.49 mmol/L — ABNORMAL HIGH (ref 0.05–0.27)

## 2022-11-28 LAB — ECHOCARDIOGRAM COMPLETE
AR max vel: 2.04 cm2
AV Area VTI: 1.94 cm2
AV Area mean vel: 1.97 cm2
AV Mean grad: 4 mmHg
AV Peak grad: 7.5 mmHg
Ao pk vel: 1.37 m/s
Area-P 1/2: 4.17 cm2
Est EF: >75
Height: 60 in
S' Lateral: 1.6 cm
Weight: 1375.67 oz

## 2022-11-28 LAB — BLOOD GAS, VENOUS
Acid-base deficit: 6.4 mmol/L — ABNORMAL HIGH (ref 0.0–2.0)
Bicarbonate: 19.1 mmol/L — ABNORMAL LOW (ref 20.0–28.0)
Drawn by: 6056
O2 Saturation: 87.3 %
Patient temperature: 36.6
pCO2, Ven: 36 mmHg — ABNORMAL LOW (ref 44–60)
pH, Ven: 7.33 (ref 7.25–7.43)
pO2, Ven: 49 mmHg — ABNORMAL HIGH (ref 32–45)

## 2022-11-28 LAB — RESP PANEL BY RT-PCR (RSV, FLU A&B, COVID)  RVPGX2
Influenza A by PCR: NEGATIVE
Influenza A by PCR: NEGATIVE
Influenza B by PCR: NEGATIVE
Influenza B by PCR: NEGATIVE
Resp Syncytial Virus by PCR: NEGATIVE
Resp Syncytial Virus by PCR: NEGATIVE
SARS Coronavirus 2 by RT PCR: NEGATIVE
SARS Coronavirus 2 by RT PCR: NEGATIVE

## 2022-11-28 LAB — HEPATITIS PANEL, ACUTE
HCV Ab: NONREACTIVE
Hep A IgM: NONREACTIVE
Hep B C IgM: NONREACTIVE
Hepatitis B Surface Ag: NONREACTIVE

## 2022-11-28 LAB — RAPID URINE DRUG SCREEN, HOSP PERFORMED
Amphetamines: NOT DETECTED
Barbiturates: NOT DETECTED
Benzodiazepines: NOT DETECTED
Cocaine: NOT DETECTED
Opiates: NOT DETECTED
Tetrahydrocannabinol: NOT DETECTED

## 2022-11-28 LAB — AMMONIA: Ammonia: 62 umol/L — ABNORMAL HIGH (ref 9–35)

## 2022-11-28 LAB — CBG MONITORING, ED
Glucose-Capillary: 550 mg/dL (ref 70–99)
Glucose-Capillary: >600 mg/dL (ref 70–99)

## 2022-11-28 LAB — LACTIC ACID, PLASMA: Lactic Acid, Venous: 3.5 mmol/L (ref 0.5–1.9)

## 2022-11-28 LAB — LIPASE, BLOOD: Lipase: 38 U/L (ref 11–51)

## 2022-11-28 LAB — PROCALCITONIN: Procalcitonin: 1.99 ng/mL

## 2022-11-28 LAB — ACETAMINOPHEN LEVEL: Acetaminophen (Tylenol), Serum: <10 ug/mL — ABNORMAL LOW (ref 10–30)

## 2022-11-28 LAB — ETHANOL: Alcohol, Ethyl (B): <10 mg/dL (ref <10)

## 2022-11-28 MED ORDER — METHYLPREDNISOLONE SODIUM SUCC 40 MG IJ SOLR: 40.0000 mg | INTRAMUSCULAR | Status: DC

## 2022-11-28 MED ORDER — FENTANYL CITRATE PF 50 MCG/ML IJ SOSY
PREFILLED_SYRINGE | INTRAMUSCULAR | Status: AC
  Administered 2022-11-28: 50 ug
  Filled 2022-11-28: qty 4

## 2022-11-28 MED ORDER — LACTATED RINGERS IV SOLN: INTRAVENOUS | Status: DC

## 2022-11-28 MED ORDER — ETOMIDATE 2 MG/ML IV SOLN: 20.0000 mg | Freq: Once | INTRAVENOUS | Status: AC

## 2022-11-28 MED ORDER — FENTANYL CITRATE PF 50 MCG/ML IJ SOSY: 50.0000 ug | PREFILLED_SYRINGE | INTRAMUSCULAR | Status: DC | PRN

## 2022-11-28 MED ORDER — ARTIFICIAL TEARS OPHTHALMIC OINT
1.0000 | TOPICAL_OINTMENT | Freq: Three times a day (TID) | OPHTHALMIC | Status: DC
  Administered 2022-11-28 – 2022-12-02 (×12): 1 via OPHTHALMIC
  Filled 2022-11-28 (×2): qty 3.5

## 2022-11-28 MED ORDER — SODIUM CHLORIDE 0.9 % IV SOLN: 500.0000 mg | INTRAVENOUS | Status: DC

## 2022-11-28 MED ORDER — SODIUM CHLORIDE 0.9 % IV SOLN: 200.0000 mg | INTRAVENOUS | Status: DC

## 2022-11-28 MED ORDER — PROPOFOL 1000 MG/100ML IV EMUL
INTRAVENOUS | Status: AC
  Administered 2022-11-28: 20 ug/kg/min via INTRAVENOUS
  Filled 2022-11-28: qty 100

## 2022-11-28 MED ORDER — DEXTROSE 50 % IV SOLN
0.0000 mL | INTRAVENOUS | Status: DC | PRN
  Administered 2022-12-01 – 2022-12-02 (×4): 50 mL via INTRAVENOUS
  Filled 2022-11-28 (×5): qty 50

## 2022-11-28 MED ORDER — SODIUM CHLORIDE 0.9 % IV SOLN: 1.0000 g | INTRAVENOUS | Status: DC

## 2022-11-28 MED ORDER — SODIUM CHLORIDE 0.9 % IV SOLN
1.0000 mg | Freq: Every day | INTRAVENOUS | Status: DC
  Filled 2022-11-28: qty 0.2

## 2022-11-28 MED ORDER — POTASSIUM CHLORIDE 10 MEQ/100ML IV SOLN
10.0000 meq | INTRAVENOUS | Status: AC
  Administered 2022-11-28 (×6): 10 meq via INTRAVENOUS
  Filled 2022-11-28: qty 100

## 2022-11-28 MED ORDER — ETOMIDATE 2 MG/ML IV SOLN
INTRAVENOUS | Status: AC
  Administered 2022-11-28: 20 mg
  Filled 2022-11-28: qty 20

## 2022-11-28 MED ORDER — VANCOMYCIN HCL 500 MG IV SOLR
500.0000 mg | INTRAVENOUS | Status: DC
  Filled 2022-11-28: qty 10

## 2022-11-28 MED ORDER — STERILE WATER FOR INJECTION IV SOLN
INTRAVENOUS | Status: DC
  Filled 2022-11-28: qty 1000

## 2022-11-28 MED ORDER — ROCURONIUM BROMIDE 10 MG/ML (PF) SYRINGE
PREFILLED_SYRINGE | INTRAVENOUS | Status: AC
  Administered 2022-11-28: 40 mg via INTRAVENOUS
  Filled 2022-11-28: qty 10

## 2022-11-28 MED ORDER — SODIUM CHLORIDE 0.9 % IV SOLN
2.0000 g | Freq: Once | INTRAVENOUS | Status: AC
  Administered 2022-11-28: 2 g via INTRAVENOUS
  Filled 2022-11-28: qty 12.5

## 2022-11-28 MED ORDER — ROCURONIUM BROMIDE 50 MG/5ML IV SOLN
40.0000 mg | Freq: Once | INTRAVENOUS | Status: AC
  Filled 2022-11-28: qty 4

## 2022-11-28 MED ORDER — CHLORHEXIDINE GLUCONATE CLOTH 2 % EX PADS
6.0000 | MEDICATED_PAD | Freq: Every day | CUTANEOUS | Status: DC
  Administered 2022-11-28 – 2022-12-06 (×9): 6 via TOPICAL

## 2022-11-28 MED ORDER — PROPOFOL 1000 MG/100ML IV EMUL
0.0000 ug/kg/min | INTRAVENOUS | Status: DC
  Filled 2022-11-28: qty 100

## 2022-11-28 MED ORDER — PROPOFOL 1000 MG/100ML IV EMUL
25.0000 ug/kg/min | INTRAVENOUS | Status: DC
  Administered 2022-11-28 (×2): 60 ug/kg/min via INTRAVENOUS
  Administered 2022-11-29 – 2022-11-30 (×5): 50 ug/kg/min via INTRAVENOUS
  Administered 2022-12-01 (×2): 25 ug/kg/min via INTRAVENOUS
  Filled 2022-11-28 (×8): qty 100

## 2022-11-28 MED ORDER — IBUPROFEN 100 MG/5ML PO SUSP
400.0000 mg | Freq: Four times a day (QID) | ORAL | Status: DC | PRN
  Administered 2022-11-29: 400 mg
  Filled 2022-11-28: qty 20

## 2022-11-28 MED ORDER — VASOPRESSIN 20 UNITS/100 ML INFUSION FOR SHOCK
0.0400 [IU]/min | INTRAVENOUS | Status: DC
  Filled 2022-11-28: qty 100

## 2022-11-28 MED ORDER — SODIUM CHLORIDE 0.9 % IV SOLN: INTRAVENOUS | Status: DC | PRN

## 2022-11-28 MED ORDER — SODIUM BICARBONATE 8.4 % IV SOLN
50.0000 meq | Freq: Once | INTRAVENOUS | Status: AC
  Administered 2022-11-28: 50 meq via INTRAVENOUS
  Filled 2022-11-28: qty 50

## 2022-11-28 MED ORDER — DEXTROSE IN LACTATED RINGERS 5 % IV SOLN: INTRAVENOUS | Status: AC

## 2022-11-28 MED ORDER — NOREPINEPHRINE 4 MG/250ML-% IV SOLN
0.0000 ug/min | INTRAVENOUS | Status: DC
  Administered 2022-11-28 (×2): 9 ug/min via INTRAVENOUS
  Administered 2022-11-28: 7 ug/min via INTRAVENOUS
  Administered 2022-11-29: 10 ug/min via INTRAVENOUS
  Administered 2022-11-29: 16 ug/min via INTRAVENOUS
  Filled 2022-11-28 (×5): qty 250

## 2022-11-28 MED ORDER — ORAL CARE MOUTH RINSE: 15.0000 mL | OROMUCOSAL | Status: DC | PRN

## 2022-11-28 MED ORDER — ENOXAPARIN SODIUM 30 MG/0.3ML IJ SOSY: 30.0000 mg | PREFILLED_SYRINGE | Freq: Every day | INTRAMUSCULAR | Status: DC

## 2022-11-28 MED ORDER — POLYETHYLENE GLYCOL 3350 17 G PO PACK: 17.0000 g | PACK | Freq: Every day | ORAL | Status: DC

## 2022-11-28 MED ORDER — DEXMEDETOMIDINE HCL IN NACL 400 MCG/100ML IV SOLN
0.0000 ug/kg/h | INTRAVENOUS | Status: DC
  Administered 2022-11-28: 0.6 ug/kg/h via INTRAVENOUS
  Filled 2022-11-28: qty 100

## 2022-11-28 MED ORDER — VANCOMYCIN HCL IN DEXTROSE 1-5 GM/200ML-% IV SOLN
1000.0000 mg | Freq: Once | INTRAVENOUS | Status: AC
  Administered 2022-11-28: 1000 mg via INTRAVENOUS
  Filled 2022-11-28: qty 200

## 2022-11-28 MED ORDER — HYDROMORPHONE HCL 1 MG/ML IJ SOLN: 1.0000 mg | Freq: Once | INTRAMUSCULAR | Status: DC

## 2022-11-28 MED ORDER — SODIUM CHLORIDE 0.9 % IV SOLN: 2.0000 g | INTRAVENOUS | Status: DC

## 2022-11-28 MED ORDER — POTASSIUM CHLORIDE 10 MEQ/100ML IV SOLN: 10.0000 meq | INTRAVENOUS | Status: AC

## 2022-11-28 MED ORDER — MIDAZOLAM HCL 2 MG/2ML IJ SOLN
INTRAMUSCULAR | Status: AC
  Filled 2022-11-28: qty 4

## 2022-11-28 MED ORDER — HYDROMORPHONE BOLUS VIA INFUSION
0.5000 mg | INTRAVENOUS | Status: DC | PRN
  Administered 2022-11-30: 0.5 mg via INTRAVENOUS

## 2022-11-28 MED ORDER — FOLIC ACID 5 MG/ML IJ SOLN
1.0000 mg | Freq: Every day | INTRAMUSCULAR | Status: DC
  Administered 2022-11-28 – 2022-11-30 (×3): 1 mg via INTRAVENOUS
  Filled 2022-11-28 (×4): qty 0.2

## 2022-11-28 MED ORDER — ROCURONIUM BROMIDE 10 MG/ML (PF) SYRINGE
30.0000 mg | PREFILLED_SYRINGE | INTRAVENOUS | Status: DC | PRN
  Administered 2022-11-28 (×2): 30 mg via INTRAVENOUS
  Filled 2022-11-28 (×2): qty 10

## 2022-11-28 MED ORDER — INSULIN REGULAR(HUMAN) IN NACL 100-0.9 UT/100ML-% IV SOLN
INTRAVENOUS | Status: AC
  Administered 2022-11-28: 4 [IU]/h via INTRAVENOUS
  Administered 2022-11-28: 10.5 [IU]/h via INTRAVENOUS
  Administered 2022-11-28: 16 [IU]/h via INTRAVENOUS
  Filled 2022-11-28 (×2): qty 100

## 2022-11-28 MED ORDER — LACTATED RINGERS IV BOLUS
20.0000 mL/kg | Freq: Once | INTRAVENOUS | Status: AC
  Administered 2022-11-28: 780 mL via INTRAVENOUS

## 2022-11-28 MED ORDER — HEPARIN SODIUM (PORCINE) 5000 UNIT/ML IJ SOLN
5000.0000 [IU] | Freq: Two times a day (BID) | INTRAMUSCULAR | Status: DC
  Administered 2022-11-28 – 2022-11-29 (×4): 5000 [IU] via SUBCUTANEOUS
  Filled 2022-11-28 (×4): qty 1

## 2022-11-28 MED ORDER — SODIUM CHLORIDE 0.9 % IV SOLN
2.0000 g | Freq: Two times a day (BID) | INTRAVENOUS | Status: DC
  Administered 2022-11-28 – 2022-11-30 (×4): 2 g via INTRAVENOUS
  Filled 2022-11-28 (×4): qty 12.5

## 2022-11-28 MED ORDER — THIAMINE HCL 100 MG/ML IJ SOLN
100.0000 mg | Freq: Every day | INTRAMUSCULAR | Status: DC
  Administered 2022-11-28 – 2022-12-01 (×4): 100 mg via INTRAVENOUS
  Filled 2022-11-28 (×4): qty 2

## 2022-11-28 MED ORDER — SODIUM CHLORIDE 0.9 % IV SOLN: 100.0000 mg | INTRAVENOUS | Status: DC

## 2022-11-28 MED ORDER — FENTANYL CITRATE PF 50 MCG/ML IJ SOSY
50.0000 ug | PREFILLED_SYRINGE | INTRAMUSCULAR | Status: DC | PRN
  Administered 2022-11-28: 50 ug via INTRAVENOUS

## 2022-11-28 MED ORDER — HYDROMORPHONE HCL-NACL 50-0.9 MG/50ML-% IV SOLN
1.0000 mg/h | INTRAVENOUS | Status: DC
  Administered 2022-11-28: 1 mg/h via INTRAVENOUS
  Administered 2022-11-29 – 2022-12-01 (×3): 2 mg/h via INTRAVENOUS
  Filled 2022-11-28 (×4): qty 50

## 2022-11-28 MED ORDER — ORAL CARE MOUTH RINSE
15.0000 mL | OROMUCOSAL | Status: DC
  Administered 2022-11-28 – 2022-12-02 (×45): 15 mL via OROMUCOSAL

## 2022-11-28 MED ORDER — DOCUSATE SODIUM 50 MG/5ML PO LIQD: 100.0000 mg | Freq: Two times a day (BID) | ORAL | Status: DC

## 2022-11-28 NOTE — Procedures (Signed)
Intubation Procedure Note  [name]  [id number]  [birth year]  Date:11/28/22  Time:11:50 AM   Provider Performing:Neli Fofana C Tamala Julian    Procedure: Intubation ([id number])  Indication(s) Respiratory Failure  Consent Unable to obtain consent due to emergent nature of procedure.   Anesthesia Etomidate and Rocuronium   Time Out Verified patient identification, verified procedure, site/side was marked, verified correct patient position, special equipment/implants available, medications/allergies/relevant history reviewed, required imaging and test results available.   Sterile Technique Usual hand hygeine, masks, and gloves were used   Procedure Description Patient positioned in bed supine.  Sedation given as noted above.  Patient was intubated with endotracheal tube using Glidescope.  View was Grade 1 full glottis .  Number of attempts was 1.  Colorimetric CO2 detector was consistent with tracheal placement.   Complications/Tolerance None, copious black secretions seen in oropharynx draping into vocal cords Chest X-ray is ordered to verify placement.   EBL Minimal   Specimen(s) None

## 2022-11-28 NOTE — Progress Notes (Addendum)
Vent changes made per Dr. Tacy Learn. Decreased Vt 360, increased RR to 35, and PEEP to 14. Pt tolerating well at this time.

## 2022-11-28 NOTE — Procedures (Signed)
Arterial Catheter Insertion Procedure Note  [name]  [id number]  Nov 15, 1985  Date:11/28/22  Time:3:10 PM    Provider Performing: Wynelle Beckmann    Procedure: Insertion of Arterial Line [phone]) without US guidance  Indication(s) Blood pressure monitoring and/or need for frequent ABGs  Consent Unable to obtain consent due to emergent nature of procedure.  Anesthesia None   Time Out Verified patient identification, verified procedure, site/side was marked, verified correct patient position, special equipment/implants available, medications/allergies/relevant history reviewed, required imaging and test results available.   Sterile Technique Maximal sterile technique including full sterile barrier drape, hand hygiene, sterile gown, sterile gloves, mask, hair covering, sterile ultrasound probe cover (if used).   Procedure Description Area of catheter insertion was cleaned with chlorhexidine and draped in sterile fashion. Without real-time ultrasound guidance an arterial catheter was placed into the right radial artery.  Appropriate arterial tracings confirmed on monitor.     Complications/Tolerance None; patient tolerated the procedure well.   EBL Minimal   Specimen(s) None

## 2022-11-28 NOTE — Progress Notes (Signed)
RT NOTE: Patient placed in prone position by RT X 2 and RN X 3 with no complications. Patient tolerated well. RT's removed ETT holder, placed protective mepilex on cheeks and above lip, and secured ETT with cloth tape. PT's head is turned to the right, right arm is up and left arm is down. Right Vitals are stable. ABG to be drawn in one hour per CCM.

## 2022-11-28 NOTE — TOC Progression Note (Signed)
Transition of Care [facility]) - Initial/Assessment Note    Patient Details  Name: [name] MRN: [id number] Date of Birth: [birth year]  [facility]) CM/SW Contact:    Milinda Antis, LCSWA Phone Number: 11/28/2022, 4:26 PM  Clinical Narrative:                 Transition of Care Department [facility]) has reviewed patient.  Patient admitted for DKA and AMS.  Patient is currently intubated. We will continue to monitor patient advancement through interdisciplinary progression rounds. If new patient transition needs arise, please place a TOC consult.          Patient Goals and CMS Choice            Expected Discharge Plan and Services                                              Prior Living Arrangements/Services                       Activities of Daily Living      Permission Sought/Granted                  Emotional Assessment              Admission diagnosis:  DKA (diabetic ketoacidosis) ([place]) [E11.10] Elevated transaminase level [R74.01] Hypothermia, initial encounter [T68.XXXA] Diabetic ketoacidosis with coma associated with type 1 diabetes mellitus ([place]) [E10.11] Community acquired pneumonia, unspecified laterality [J18.9] Patient Active Problem List   Diagnosis Date Noted   Depression with anxiety 11/16/2022   Hyperglycemia XX123456   Acute metabolic encephalopathy XX123456   Hypophosphatemia 11/15/2022   Severe protein-energy malnutrition ([place]) 11/15/2022   Cocaine use 11/14/2022   Encephalopathy 11/14/2022   Protein-calorie malnutrition, severe 99991111   Chronic systolic CHF (congestive heart failure) ([place]) 11/12/2022   DKA, type 1 ([place]) 11/12/2022   Malnutrition of moderate degree 05/30/2022   Gluteal abscess 05/27/2022   UTI (urinary tract infection) 05/27/2022   Acute pyelonephritis 05/27/2022   Hypokalemia 05/27/2022   Bilateral hydronephrosis 05/27/2022   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   Abscess,  gluteal, right 05/27/2022   HTN (hypertension) 99991111   Metabolic encephalopathy    Dehydration 03/09/2021   Hyperkalemia 03/09/2021   DKA (diabetic ketoacidosis) ([place]) XX123456   Acute systolic CHF (congestive heart failure) ([place]) 01/12/2021   Benign essential HTN 01/12/2021   Congestive heart failure (CHF) ([place]) 123456   Systolic CHF, acute ----EF 30 to 35 % 12/03/2020   Rt Sided MRSA Empyema of pleura ([place]) 12/03/2020   Pneumonia due to COVID-19 virus 12/01/2020   Acute Hypoxic respiratory failure due to COVID-19 ([place]) 12/01/2020   Sepsis secondary to community-acquired pneumonia with parapneumonic effusion 12/01/2020   CAP (community acquired pneumonia)--- with parapneumonic effusion/MRSA Empyema 12/01/2020   Severe MRSA sepsis with septic shock -secondary to community-acquired pneumonia with parapneumonic effusion/Empyema 12/01/2020   Diabetes type 1, controlled ([place]) 11/30/2020   Hyperosmolar hyperglycemic state (HHS) ([place]) 04/06/2020   Generalized abdominal pain    Cocaine abuse ([place])    Pyelonephritis 03/08/2019   Abscess    Systolic murmur    Hyperglycemia due to type 1 diabetes mellitus ([place]) 10/28/2018   Substance abuse ([place]) 09/15/2018   Tobacco abuse 09/15/2018   Cough 09/15/2018   GERD (gastroesophageal reflux disease) 09/15/2018   Nausea & vomiting  Acute lower UTI 07/14/2016   Panic disorder without agoraphobia with moderate panic attacks 01/20/2016   Benzodiazepine abuse ([place]) 01/20/2016   Substance induced mood disorder ([place]) 01/19/2016   DKA 05/01/2011   Type 1 diabetes mellitus with other specified complication ([place]) 0000000   Leukocytosis 05/01/2011   PCP:  [place], [place]:   [id number] [place] [place]Phone: [phone]: [phone]    Social Determinants of Health (SDOH) Social History: SDOH Screenings   Food Insecurity: No Food  Insecurity (11/16/2022)  Housing: Low Risk  (11/16/2022)  Transportation Needs: No Transportation Needs (11/16/2022)  Tobacco Use: High Risk (11/17/2022)   SDOH Interventions:     Readmission Risk Interventions    11/16/2022    1:57 PM 03/11/2021    3:39 PM  Readmission Risk Prevention Plan  Transportation Screening Complete Complete  PCP or Specialist Appt within 5-7 Days Complete   PCP or Specialist Appt within 3-5 Days  Complete  Home Care Screening Complete   Medication Review (RN CM) Referral to Pharmacy   HRI or HPinal Complete  Social Work [place]  Complete  Palliative Care Screening  Not Applicable  Medication Review (Press photographer  Complete

## 2022-11-28 NOTE — ED Notes (Signed)
Report given to [facility] RN

## 2022-11-28 NOTE — Progress Notes (Signed)
Initial Nutrition Assessment  DOCUMENTATION CODES:   Underweight, Severe malnutrition in context of chronic illness  INTERVENTION:   When medically stable, recommend initiation of enteral nutrition via Cortrak: - Start Vital 1.5 @ 20 ml/hr and advance rate by 10 ml q 12 hours to goal rate of 45 ml/hr (1080 ml/day)  Recommended tube feeding regimen at goal rate would provide 1620 kcal, 73 grams of protein, and 825 ml of H2O.   Pt at very high refeeding risk. Once tube feeds are started, recommend monitoring magnesium, potassium, and phosphorus q 12 hours for at least 6 occurrences.  - Once TF initiated, recommend thiamine 100 mg daily per tube x 7 days due to refeeding risk  NUTRITION DIAGNOSIS:   Severe Malnutrition related to chronic illness (uncontrolled T1DM (hemoglobin A1C >15.5), HFrEF) as evidenced by severe fat depletion, severe muscle depletion, percent weight loss (18.9% weight loss in < 2 months).  GOAL:   Patient will meet greater than or equal to 90% of their needs  MONITOR:   Vent status, Labs, Weight trends  REASON FOR ASSESSMENT:   Ventilator    ASSESSMENT:   37 year old female who presented to the ED on 2/11 with hyperglycemia and AMS. PMH of T1DM, cocaine abuse, HFrEF, HTN, anxiety, depression. Pt admitted with DKA.  02/12 - intubated, Cortrak placement planned  Discussed pt with RN and during ICU rounds. Pt required intubation this AM. Plan for Cortrak placement. Per MD, pt developing ARDS and may need proning. ECMO team to evaluate in case pt continues to deteriorate.  Discussed pt with PCCM MD. Will hold off on starting TF until tomorrow as pt's blood sugars remain >500 and are trending upward per RN despite being on 14 units/hr of insulin.  Reviewed weight history in chart. Pt with a weight loss of 9.1 kg since 10/06/22. This is an 18.9% weight loss in less than 2 months which is severe and significant for timeframe. Pt meets criteria for severe  malnutrition. Unable to obtain diet history at this time. Per notes, pt eating very little since recent hospital admission (discharged 11/17/22).  Pt at very high refeeding risk. Once tube feeds are started, recommend monitoring magnesium, potassium, and phosphorus q 12 hours for at least 6 occurrences.  Patient is currently intubated on ventilator support MV: 10.4 L/min Temp (24hrs), Avg:95.6 F (35.3 C), Min:88 F (31.1 C), Max:99.3 F (37.4 C)  Drips: Propofol: 40 mcg/kg/min (provides 247 kcal daily from lipid) Insulin: 14 units/hr Levophed: 7 mcg/min Sodium bicarb: 100 ml/hr  Medications reviewed and include: colace, miralax, IV folic acid 1 mg daily, IV thiamine 100 mg daily, IV abx, IV micafungin, IV KCl 10 mEq x 6  Labs reviewed: sodium 152, potassium 3.6 (after replacement), BUN 30, creatinine 1.37, elevated LFTs, lactic acid 3.5, WBC 16.6, hemoglobin A1C >15.5 CBG's: 416-600 x 12 hours  UOP: 1000 ml x 12 hours I/O's: +4.7 L since admit  NUTRITION - FOCUSED PHYSICAL EXAM:  Flowsheet Row Most Recent Value  Orbital Region Severe depletion  Upper Arm Region Severe depletion  Thoracic and Lumbar Region Severe depletion  Buccal Region Unable to assess  Temple Region Severe depletion  Clavicle Bone Region Severe depletion  Clavicle and Acromion Bone Region Severe depletion  Scapular Bone Region Severe depletion  Dorsal Hand Severe depletion  Patellar Region Severe depletion  Anterior Thigh Region Severe depletion  Posterior Calf Region Severe depletion  Edema (RD Assessment) None  Hair Reviewed  Eyes Reviewed  Mouth Reviewed  Skin Reviewed  Nails Reviewed    Diet Order:   Diet Order             Diet NPO time specified  Diet effective now                   EDUCATION NEEDS:   Not appropriate for education at this time  Skin:  Skin Assessment: Skin Integrity Issues: Other: fissures to bilateral heels, fissures to bilateral hands  Last BM:  no  documented BM  Height:   Ht Readings from Last 1 Encounters:  11/27/22 5' (1.524 m)    Weight:   Wt Readings from Last 1 Encounters:  11/27/22 39 kg    Ideal Body Weight:  45.5 kg  BMI:  Body mass index is 16.79 kg/m.  Estimated Nutritional Needs:   Kcal:  1500-1700  Protein:  65-60 grams  Fluid:  1.5-1.7 L    Gustavus Bryant, MS, RD, LDN Inpatient Clinical Dietitian Please see AMiON for contact information.

## 2022-11-28 NOTE — Progress Notes (Signed)
11/28/2022 Seen/examined Ripped out foley Encephalopathic but protecting airway Yelling and thrashing ext Safety mitts in place Exam notable for rhonci, moving everything, HR tachy, ext warm, +muscle wasting  Labs reviewed  A: DM1 DKA Metabolic encephalopathy- ammonia mildly up, in context of polysubstance abuse, DKA, poor PO Cocaine abuse Shock liver unclear etiology- RUQ nothing actionable, lipase not bad ARDS question aspiration, crack lung Acute kidney injury Hx HF resolved likely substance abuse related Severe protein calorie malnutrition POA GERD  P: Insulin/fluids per endotool CAP coverage Stop bicarb gtt Check RVP, Pct, Hep panel Trend beta hydroxybutyrate, anion gap Check noon LFTs, if still up start NAC Thiamine/folate Restraints and/or precedex PRN to prevent self harm Need to figure out if there is a way for her to care better for self at home otherwise she will continue to be re-admitted  My cc time 35 mins  Erskine Emery MD PCCM

## 2022-11-28 NOTE — Procedures (Signed)
Central Venous Catheter Insertion Procedure Note  Nashai Vandenbroek  [id number]  08-23-1986  Date:11/28/22  Time:11:55 AM   Provider Performing:Argil Mahl Loletha Grayer Tamala Julian   Procedure: Insertion of Non-tunneled Central Venous Catheter([id number]) with US guidance [id number])   Indication(s) Medication administration and Difficult access  Consent Unable to obtain consent due to emergent nature of procedure.  Anesthesia Topical only with 1% lidocaine   Timeout Verified patient identification, verified procedure, site/side was marked, verified correct patient position, special equipment/implants available, medications/allergies/relevant history reviewed, required imaging and test results available.  Sterile Technique Maximal sterile technique including full sterile barrier drape, hand hygiene, sterile gown, sterile gloves, mask, hair covering, sterile ultrasound probe cover (if used).  Procedure Description Area of catheter insertion was cleaned with chlorhexidine and draped in sterile fashion.  With real-time ultrasound guidance a central venous catheter was placed into the left internal jugular vein. Nonpulsatile blood flow and easy flushing noted in all ports.  The catheter was sutured in place and sterile dressing applied.  Complications/Tolerance None; patient tolerated the procedure well. Chest X-ray is ordered to verify placement for internal jugular or subclavian cannulation.   Chest x-ray is not ordered for femoral cannulation.  EBL Minimal  Specimen(s) None

## 2022-11-28 NOTE — Procedures (Signed)
Bronchoscopy Procedure Note  [name]  [id number]  19-Jul-1986  Date:11/28/22  Time:11:54 AM   Provider Performing:Elzada Pytel C Tamala Julian   Procedure(s):  Flexible bronchoscopy with bronchial alveolar lavage [id number])  Indication(s) ARDS  Consent Unable to obtain consent due to emergent nature of procedure.  Anesthesia In place for intubation   Time Out Verified patient identification, verified procedure, site/side was marked, verified correct patient position, special equipment/implants available, medications/allergies/relevant history reviewed, required imaging and test results available.   Sterile Technique Usual hand hygiene, masks, gowns, and gloves were used   Procedure Description Bronchoscope advanced through endotracheal tube and into airway.  Airways were examined down to subsegmental level with findings noted below.   Following diagnostic evaluation, BAL(s) performed in RML with normal saline and return of slightly cloudy fluid fluid  Findings:  - ETT in good position - Black splotches/staining of bronchi with semi-clear BAL most c/w gastric secretions falling into airway given findings on ETT  Complications/Tolerance None; patient tolerated the procedure well. Chest X-ray is not needed post procedure.   EBL Minimal   Specimen(s) BAL RML

## 2022-11-28 NOTE — Progress Notes (Signed)
Latest Reference Range & Units 11/28/22 12:47  Sample type  ARTERIAL  pH, Arterial 7.35 - 7.45  7.241 (L)  pCO2 arterial 32 - 48 mmHg 54.0 (H)  pO2, Arterial 83 - 108 mmHg 127 (H)  TCO2 22 - 32 mmol/L 25  Acid-base deficit 0.0 - 2.0 mmol/L 4.0 (H)  Bicarbonate 20.0 - 28.0 mmol/L 23.1  O2 Saturation % 98  Patient temperature  37.3 C  Collection site  RADIAL, ALLEN'S TEST ACCEPTABLE  (L): Data is abnormally low (H): Data is abnormally high   Post intubation ABG obtained per order with above results. CCM MD notified-no changes to be made at this time. RT will await further orders.

## 2022-11-28 NOTE — Procedures (Signed)
Cortrak  Person Inserting Tube:  Ranell Patrick D, RD Tube Type:  Cortrak - 55 inches Tube Size:  10 Tube Location:  Right nare Secured by: Bridle Technique Used to Measure Tube Placement:  Marking at nare/corner of mouth Cortrak Secured At:  75 cm Procedure Comments:  Cortrak Tube Team Note:  Consult received to place a Cortrak feeding tube.   X-ray is required, abdominal x-ray has been ordered by the Cortrak team. Please confirm tube placement before using the Cortrak tube.   If the tube becomes dislodged please keep the tube and contact the Cortrak team at [email or web address] for replacement.  If after hours and replacement cannot be delayed, place a NG tube and confirm placement with an abdominal x-ray.    Ranell Patrick, RD, LDN Clinical Dietitian RD pager # available in [place]  After hours/weekend pager # available in [facility]

## 2022-11-28 NOTE — Progress Notes (Addendum)
11/28/2022   Spoke to sister Cedric Fishman on phone and updated. Husband and daughter are sometimes hard to get in touch with, Vicente Males is okay making decisions in their stead in event of emergencies such as need for ECMO.  New daughter cell number: [phone].  Erskine Emery MD PCCM

## 2022-11-28 NOTE — Progress Notes (Signed)
Pt's head turned to pt's left side at this time.  ETT secured. Pt tolerated well.

## 2022-11-28 NOTE — Progress Notes (Addendum)
eLink Physician-Brief Progress Note Patient Name: [name] DOB: 07-12-1986 MRN: [id number]   Date of Service  11/28/2022  HPI/Events of Note  repeat abg after proning  7.23, 67.8, 105, 28.5 (341m/8cc/kg, 32, +10); peak pressure 24, mean 16 Ultimately, the patient's gas is acceptable as is but she is also in multi pressor shock so avoid acidosis.  Currently, she has a wide pulse pressure requiring 10 of norepinephrine, MAP of 70 with systolic [id number]   max vent settings. considering ECMO  eICU Interventions  Change tidal volume from 360->400,adding 1.28L minute ventilation, recheck gas within the next 1 to 2 hours  Change goals on vasopressors to systolic goal [id number] maintain vasopressin, titrate norepinephrine.   1127: Added scheduled BMPs and beta hydroxybutyrate  Intervention Category Major Interventions: Hypoxemia - evaluation and management  Kevaughn Ewing 11/28/2022, 7:29 PM

## 2022-11-28 NOTE — Progress Notes (Signed)
eLink Physician-Brief Progress Note Patient Name: [name] DOB: 13-Sep-1986 MRN: [id number]   Date of Service  11/28/2022  HPI/Events of Note  50 F hx of DM (A1C >15), brought in by EMS after roommates reported she has not been acting herself the past 2 days. Hypothermic 89.7 and workup reveals she was in DKA glucose 891, corrected sodium 152, CO2 <7, creatinine 1.89. VBG 6.95/24  CXR left > right airspace opacities  eICU Interventions  Started on insulin and bicarb drip/fluids as per DKA protocol Ceftriaxone and azithromycin for multifocal pneumonia     Intervention Category Evaluation Type: New Patient Evaluation  Judd Lien 11/28/2022, 3:19 AM

## 2022-11-28 NOTE — Progress Notes (Signed)
[place] Progress Note Patient Name: [name] DOB: 28-Apr-1986 MRN: [id number]   Date of Service  11/28/2022  HPI/Events of Note  Patoent awakened and agitated pulling t lines and pulled out foley. Mittens were tried without success need bil sot wrist restraints ordered  eICU Interventions  Bilateral soft wrist restraints ordered Bedside team to assess in am if restraints to be continued     Intervention Category Minor Interventions: Agitation / anxiety - evaluation and management  Judd Lien 11/28/2022, 5:56 AM

## 2022-11-28 NOTE — Progress Notes (Signed)
Pt's head turned to pt's right at this time.  ETT secured at 22 with cloth tape.  Pt tolerated well.

## 2022-11-28 NOTE — Inpatient Diabetes Management (Signed)
Inpatient Diabetes Program Recommendations  AACE/ADA: New Consensus Statement on Inpatient Glycemic Control (2015)  Target Ranges:  Prepandial:   less than 140 mg/dL      Peak postprandial:   less than 180 mg/dL (1-2 hours)      Critically ill patients:  140 - 180 mg/dL   Lab Results  Component Value Date   GLUCAP 475 (H) 11/28/2022   HGBA1C  11/15/2022    DISREGARD RESULTS.  SEE [id number]. TESTING TO BE CONFIRMED AT [facility]. NOTIFIED BRITTANY CLAPP,RN AT [place] [id number] 11/15/2022 BY ZBEECH.   HGBA1C >15.5 (H) 11/15/2022    Latest Reference Range & Units 11/28/22 07:06  Sodium 135 - 145 mmol/L 147 (H)  Potassium 3.5 - 5.1 mmol/L 3.4 (L)  Chloride 98 - 111 mmol/L 110  CO2 22 - 32 mmol/L 14 (L)  Glucose 70 - 99 mg/dL 560 (HH)  BUN 6 - 20 mg/dL 30 (H)  Creatinine 0.44 - 1.00 mg/dL 1.37 (H)  Calcium 8.9 - 10.3 mg/dL 8.1 (L)  Anion gap 5 - 15  23 (H)  (HH): Data is critically high (H): Data is abnormally high (L): Data is abnormally low  Diabetes history: DM1 Outpatient Diabetes medications: Lantus 35 QD, Novolog s/s, 1:15 CHO ratio and CF 50 with goal of 140 mg/dL. Current orders for Inpatient glycemic control: IV insulin  Inpatient Diabetes Program Recommendations:   Noted patient discharged home on 11-17-22 from prior admission. When DM coordinator spoke with patient on 11/16/22, patient had not taken insulin due to poor intake. Reviewed with patient also need for basal insulin qday even with poor intake. Currently on IV insulin drip per endotool.  Will follow while inpatient.  Thank you, Nani Gasser. Raychel Dowler, RN, MSN, CDE  Diabetes Coordinator Inpatient Glycemic Control Team Team Pager [phone] (8am-5pm) 11/28/2022 9:09 AM

## 2022-11-28 NOTE — Progress Notes (Signed)
Echocardiogram 2D Echocardiogram has been performed.  [name] 11/28/2022, 3:01 PM

## 2022-11-28 NOTE — Progress Notes (Signed)
Pt Vt increased to 400 per MD request.

## 2022-11-28 NOTE — H&P (Signed)
NAME:  [name], MRN:  [id number], DOB:  06-25-1986, LOS: 0 ADMISSION DATE:  11/27/2022, CONSULTATION DATE:  11/28/22 REFERRING MD:  Marlon Pel , CHIEF COMPLAINT:  ams   History of Present Illness:  37 yo woman with hx of DM1, HFrEF, HTN, anxiety/depression, cocaine use, DKA, presenting with AMS, DKA.   Friends reportedly called 911 for ams.   Friend with her reports poor po intake since prior admission. Unk substance ingestions.   Recently admitted with dka, discharged 2/1.   PNA, lactic acidosis, elevated AST, Alt, Alk phos, Low alb, tbili 2.1 .  Hypothermia lacitc aciodis s3.8 leukocytoisis 16.6    In ED, given 1 amp bicarb.   Rocephin and Azithro.   CXR:   Interval development of bilateral, left greater than right, airspace and interstitial opacities. Findings consistent with infection/inflammation. Followup PA and lateral chest X-ray is recommended in 3-4 weeks following therapy to ensure resolution.  Echo; 1/23  Left ventricular ejection fraction, by estimation, is 60 to 65%. The  left ventricle has normal function. The left ventricle has no regional  wall motion abnormalities. Left ventricular diastolic parameters were  normal.   2. Right ventricular systolic function is normal. The right ventricular  size is normal. Tricuspid regurgitation signal is inadequate for assessing  PA pressure.   3. The mitral valve is normal in structure. Mild mitral valve  regurgitation.   4. The aortic valve is tricuspid. Aortic valve regurgitation is not  visualized.   5. The inferior vena cava is normal in size with greater than 50%  respiratory variability, suggesting right atrial pressure of 3 mmHg.  Pertinent  Medical History  Home lantus 18m daily  Mod sliding scale insulin  Augmentin for pna (on d/c 2/1 ) x 3 days   Significant Hospital Events: Including procedures, antibiotic start and stop dates in addition to other pertinent events     Interim History / Subjective:     Objective   Blood pressure 93/63, pulse 68, temperature (!) 88 F (31.1 C), temperature source Rectal, resp. rate 12, height 5' (1.524 m), weight 39 kg, SpO2 (!) 89 %.       No intake or output data in the 24 hours ending 11/28/22 0020 Filed Weights   11/27/22 2249  Weight: 39 kg    Examination: General somewhat somnolent, responsive to voice.  Only minimal improvement with narcan  HENT: eyelid edema, edentulous  Lungs: B rhonchi/crackles  Cardiovascular: RRR  Abdomen: nt, nd, nbs  Extremities: no edema, erythema of hands and feet, appears weather/exposure related.  Neuro: somnolent, arousable to voice, withdraws to pain,  GU:   Resolved Hospital Problem list     Assessment & Plan:   AMS: likely multifactorial - DKA, lactic acidosis, septic.  minimal if any improvement with narcan.  DKA - cont treatment per protocol.  Received 3L LR, now hypoxemic and requiring NRB.  Hold on further fluid bolus, cont maintenance.  Insulin gtt.  Respirations are slow but large tidal volume.  Recheck abg.  Lactic acidosis- sepsis.  Possible source PNA. Cef and azithro.  UA pending.  Needs foley  Hypotension - improving with improving acidosis.  Bicarb infusion for now.  Transamintits:  Abd uKoreapending.   Best Practice (right click and "Reselect all SmartList Selections" daily)   Diet/type: NPO DVT prophylaxis: LMWH GI prophylaxis: PPI Lines: N/A Foley:  N/A Code Status:  full code Last date of multidisciplinary goals of care discussion []$  Attempted to call JJeneen Rinks(listed as spouse) andor  daughter Bubba Hales.  Same number listed, no answer  Labs   CBC: Recent Labs  Lab 11/27/22 2257 11/27/22 2258  WBC 16.6*  --   NEUTROABS PENDING  --   HGB 10.7* 12.2  11.9*  HCT 34.2* 36.0  35.0*  MCV 95.8  --   PLT 152  --     Basic Metabolic Panel: Recent Labs  Lab 11/27/22 2257 11/27/22 2258  NA 140 141  139  K 4.8 4.9  4.9  CL 104 115*  CO2 <7*  --   GLUCOSE 891* >700*  BUN  37* 35*  CREATININE 1.89* 1.00  CALCIUM 9.0  --    GFR: Estimated Creatinine Clearance: 47 mL/min (by C-G formula based on SCr of 1 mg/dL). Recent Labs  Lab 11/27/22 2257  WBC 16.6*  LATICACIDVEN 3.8*    Liver Function Tests: Recent Labs  Lab 11/27/22 2257  AST 105*  ALT 1,121*  ALKPHOS 202*  BILITOT 2.1*  PROT 4.9*  ALBUMIN 2.1*   No results for input(s): "LIPASE", "AMYLASE" in the last 168 hours. No results for input(s): "AMMONIA" in the last 168 hours.  ABG    Component Value Date/Time   PHART 7.153 (LL) 03/09/2021 0700   PCO2ART <19.0 (LL) 03/09/2021 0700   PO2ART 82.8 (L) 03/09/2021 0700   HCO3 3.8 (L) 11/27/2022 2258   TCO2 7 (L) 11/27/2022 2258   TCO2 <5 (L) 11/27/2022 2258   ACIDBASEDEF 27.0 (H) 11/27/2022 2258   O2SAT 74 11/27/2022 2258     Coagulation Profile: No results for input(s): "INR", "PROTIME" in the last 168 hours.  Cardiac Enzymes: No results for input(s): "CKTOTAL", "CKMB", "CKMBINDEX", "TROPONINI" in the last 168 hours.  HbA1C: Hgb A1c MFr Bld  Date/Time Value Ref Range Status  11/15/2022 04:01 AM  4.8 - 5.6 % Corrected   DISREGARD RESULTS.  SEE [id number]. TESTING TO BE CONFIRMED AT [facility]. NOTIFIED BRITTANY CLAPP,RN AT [place] [id number] 11/15/2022 BY ZBEECH.    Comment:    CORRECTED ON 01/30 AT 1224: PREVIOUSLY REPORTED AS 15.9  11/15/2022 04:01 AM >15.5 (H) 4.8 - 5.6 % Final    Comment:    (NOTE) **Verified by repeat analysis**         Prediabetes: 5.7 - 6.4         Diabetes: >6.4         Glycemic control for adults with diabetes: <7.0     CBG: Recent Labs  Lab 11/27/22 2247  GLUCAP >600*    Review of Systems:   Unableto assess   Past Medical History:  She,  has a past medical history of Acid reflux (2010), CHF (congestive heart failure) ([place]), Diabetes mellitus, DKA (05/01/2011), Hypertension, and Polysubstance abuse ([place]).   Surgical History:   Past Surgical History:  Procedure Laterality Date   INCISION AND DRAINAGE  ABSCESS Right 05/30/2022   Procedure: INCISION AND DRAINAGE ABSCESS;  Surgeon: Ronny Bacon, MD;  Location: [facility] ORS;  Service: General;  Laterality: Right;  Buttock/perirectal abscess   TUBAL LIGATION     WOUND EXPLORATION Left 07/26/2014   Procedure: Irrigation, debridement and exploration of elbow wounds;  Surgeon: Leanora Cover, MD;  Location: [facility];  Service: Orthopedics;  Laterality: Left;     Social History:   reports that she has been smoking cigarettes. She has a 15.00 pack-year smoking history. She has been exposed to tobacco smoke. She has never used smokeless tobacco. She reports current drug use. Drugs: Benzodiazepines, Methamphetamines, and Cocaine. She reports that she  does not drink alcohol.   Family History:  Her family history includes Coronary artery disease in her mother; Diabetes type I in her daughter; Heart attack (age of onset: 79) in her father; Lung cancer in her mother; Thyroid disease in her maternal grandmother.   Allergies Allergies  Allergen Reactions   Codeine Anaphylaxis and Nausea And Vomiting   Penicillins Rash    As a teenager   Tramadol Itching     Home Medications  Prior to Admission medications   Medication Sig Start Date End Date Taking? Authorizing Provider  ascorbic acid (VITAMIN C) 500 MG tablet Take 1 tablet (500 mg total) by mouth 2 (two) times daily. 06/01/22   Swayze, Ava, DO  carvedilol (COREG) 6.25 MG tablet Take 1 tablet (6.25 mg total) by mouth 2 (two) times daily with a meal. Increased from 3.125 mg. 12/31/20 05/27/22  Enzo Bi, MD  Ensure Max Protein (ENSURE MAX PROTEIN) LIQD Take 330 mLs (11 oz total) by mouth at bedtime. 06/01/22   Swayze, Ava, DO  insulin aspart (NOVOLOG FLEXPEN) 100 UNIT/ML FlexPen Inject 1-15 Units into the skin 3 (three) times daily with meals. Dose based on CBG and carb count, 1 unit for every 15 carb. 11/01/21   Orson Eva, MD  insulin aspart (NOVOLOG) 100 UNIT/ML injection Inject 4 Units into the skin 3 (three)  times daily with meals. 06/01/22   Swayze, Ava, DO  insulin aspart (NOVOLOG) 100 UNIT/ML injection Inject 0-15 Units into the skin 4 (four) times daily -  before meals and at bedtime. CBG 70 - 120: 0 units CBG 121 - 150: 2 units CBG 151 - 200: 3 units CBG 201 - 250: 5 units CBG 251 - 300: 8 units CBG 301 - 350: 11 units CBG 351 - 400: 15 units CBG > 400: call MD and obtain STAT lab verification 11/17/22   Wendee Beavers T, MD  insulin glargine (LANTUS SOLOSTAR) 100 UNIT/ML Solostar Pen Inject 35 Units into the skin daily. 06/01/22 05/27/23  Swayze, Ava, DO  insulin glargine (LANTUS) 100 UNIT/ML injection Inject 0.4 mLs (40 Units total) into the skin daily. 11/17/22   Mercy Riding, MD  lisinopril (ZESTRIL) 5 MG tablet Take 1 tablet (5 mg total) by mouth daily. New medication for your heart. 11/01/21 05/27/22  Orson Eva, MD  metoCLOPramide (REGLAN) 5 MG tablet Take 1 tablet (5 mg total) by mouth 4 (four) times daily -  before meals and at bedtime. 11/17/22 12/17/22  Mercy Riding, MD  Multiple Vitamin (MULTIVITAMIN WITH MINERALS) TABS tablet Take 1 tablet by mouth daily. 06/02/22   Swayze, Ava, DO  nicotine (NICODERM CQ - DOSED IN MG/24 HOURS) 21 mg/24hr patch Place 1 patch (21 mg total) onto the skin daily. 06/02/22   Swayze, Ava, DO  omeprazole (PRILOSEC) 40 MG capsule Take 40 mg by mouth daily.    [provider]  oxyCODONE (OXY IR/ROXICODONE) 5 MG immediate release tablet Take 1-2 tablets (5-10 mg total) by mouth every 4 (four) hours as needed for moderate pain or severe pain. 06/01/22   Swayze, Ava, DO  zinc sulfate 220 (50 Zn) MG capsule Take 1 capsule (220 mg total) by mouth daily. 06/02/22   Swayze, Ava, DO     Critical care time: 92mn

## 2022-11-28 NOTE — Progress Notes (Addendum)
Pharmacy Antibiotic Note  [name] is a 37 y.o. female admitted on 11/27/2022 with pneumonia.  Pharmacy has been consulted for vancomycin and cefepime dosing. Note low total body weight = 39 kg . WBC 16.6, PCT 1.99, LA 3.5 (DKA), afebrile but presented hypothermic 78F.   Plan: Vancomycin 1060m IV x1 followed by vancomycin 5021mIV q 24 hours (predicted AUC 542, used Scr 1.37; goal AUC 400-550, goal trough 15-205mmL)  -vanc levels per protocol, may dose by levels pending renal function fluctuations Cefepime 2g IV q 12h  -f/u renal function, cultures, clinical status  -repeat MRSA swab    Height: 5' (152.4 cm) Weight: 39 kg (85 lb 15.7 oz) IBW/kg (Calculated) : 45.5  Temp (24hrs), Avg:94.1 F (34.5 C), Min:88 F (31.1 C), Max:97.3 F (36.3 C)  Recent Labs  Lab 11/27/22 2257 11/27/22 2258 11/28/22 0111 11/28/22 0706  WBC 16.6*  --   --   --   CREATININE 1.89* 1.00 1.62* 1.37*  LATICACIDVEN 3.8*  --  3.5*  --     Estimated Creatinine Clearance: 34.3 mL/min (A) (by C-G formula based on SCr of 1.37 mg/dL (H)).    Allergies  Allergen Reactions   Codeine Anaphylaxis and Nausea And Vomiting   Penicillins Rash    As a teenager   Tramadol Itching    Antimicrobials this admission: 2/11 CTX x1 2/11 azithro x1 2/12 cefepime> 2/12 vanc>  Dose adjustments this admission:   Microbiology results: 2/12 BAL:  1/28 MRSA: pos 1/28 Bcx: NGx5  2/11 RVP neg   Thank you for allowing pharmacy to be a part of this patient's care.  MadWilson SingerharmD Clinical Pharmacist 11/28/2022 11:02 AM

## 2022-11-28 NOTE — Progress Notes (Addendum)
Steady deterioration in mentation and sats through am requiring intubation.  Predictably difficult to get good pH due to developing ARDS and underlying severe DKA.  Will have to bridge with a bit more bicarb, ARDSnet ventilation.  Probably needs proning/ NMB.  Will ask ECMO team to eval in case continues to deteriorate.  Abx broadened, check fungitel serum, aspergillus Ag BAL given dark black material in back of throat although this could have just been dried bile and secretions.  Will reach out to family: left VM for unit.  Additional cc time 60 mins  Erskine Emery MD PCCM

## 2022-11-29 ENCOUNTER — Inpatient Hospital Stay (HOSPITAL_COMMUNITY): Payer: Medicaid Other

## 2022-11-29 DIAGNOSIS — E1011 Type 1 diabetes mellitus with ketoacidosis with coma: Secondary | ICD-10-CM | POA: Diagnosis not present

## 2022-11-29 LAB — POCT I-STAT 7, (LYTES, BLD GAS, ICA,H+H)
Acid-Base Excess: 1 mmol/L (ref 0.0–2.0)
Acid-Base Excess: 0 mmol/L (ref 0.0–2.0)
Bicarbonate: 29.1 mmol/L — ABNORMAL HIGH (ref 20.0–28.0)
Bicarbonate: 26.4 mmol/L (ref 20.0–28.0)
Calcium, Ion: 1.29 mmol/L (ref 1.15–1.40)
Calcium, Ion: 1.27 mmol/L (ref 1.15–1.40)
HCT: 32 % — ABNORMAL LOW (ref 36.0–46.0)
HCT: 29 % — ABNORMAL LOW (ref 36.0–46.0)
Hemoglobin: 10.9 g/dL — ABNORMAL LOW (ref 12.0–15.0)
Hemoglobin: 9.9 g/dL — ABNORMAL LOW (ref 12.0–15.0)
O2 Saturation: 100 %
O2 Saturation: 100 %
Patient temperature: 37
Patient temperature: 36.9
Potassium: 4.3 mmol/L (ref 3.5–5.1)
Potassium: 4.3 mmol/L (ref 3.5–5.1)
Sodium: 149 mmol/L — ABNORMAL HIGH (ref 135–145)
Sodium: 149 mmol/L — ABNORMAL HIGH (ref 135–145)
TCO2: 31 mmol/L (ref 22–32)
TCO2: 28 mmol/L (ref 22–32)
pCO2 arterial: 62.9 mmHg — ABNORMAL HIGH (ref 32–48)
pCO2 arterial: 52.5 mmHg — ABNORMAL HIGH (ref 32–48)
pH, Arterial: 7.273 — ABNORMAL LOW (ref 7.35–7.45)
pH, Arterial: 7.309 — ABNORMAL LOW (ref 7.35–7.45)
pO2, Arterial: 443 mmHg — ABNORMAL HIGH (ref 83–108)
pO2, Arterial: 196 mmHg — ABNORMAL HIGH (ref 83–108)

## 2022-11-29 LAB — BASIC METABOLIC PANEL
Anion gap: 5 (ref 5–15)
Anion gap: 6 (ref 5–15)
Anion gap: 6 (ref 5–15)
Anion gap: 7 (ref 5–15)
BUN: 24 mg/dL — ABNORMAL HIGH (ref 6–20)
BUN: 24 mg/dL — ABNORMAL HIGH (ref 6–20)
BUN: 25 mg/dL — ABNORMAL HIGH (ref 6–20)
BUN: 25 mg/dL — ABNORMAL HIGH (ref 6–20)
CO2: 26 mmol/L (ref 22–32)
CO2: 26 mmol/L (ref 22–32)
CO2: 28 mmol/L (ref 22–32)
CO2: 28 mmol/L (ref 22–32)
Calcium: 7.7 mg/dL — ABNORMAL LOW (ref 8.9–10.3)
Calcium: 7.8 mg/dL — ABNORMAL LOW (ref 8.9–10.3)
Calcium: 7.9 mg/dL — ABNORMAL LOW (ref 8.9–10.3)
Calcium: 8 mg/dL — ABNORMAL LOW (ref 8.9–10.3)
Chloride: 113 mmol/L — ABNORMAL HIGH (ref 98–111)
Chloride: 114 mmol/L — ABNORMAL HIGH (ref 98–111)
Chloride: 114 mmol/L — ABNORMAL HIGH (ref 98–111)
Chloride: 115 mmol/L — ABNORMAL HIGH (ref 98–111)
Creatinine, Ser: 0.99 mg/dL (ref 0.44–1.00)
Creatinine, Ser: 1 mg/dL (ref 0.44–1.00)
Creatinine, Ser: 1.01 mg/dL — ABNORMAL HIGH (ref 0.44–1.00)
Creatinine, Ser: 1.01 mg/dL — ABNORMAL HIGH (ref 0.44–1.00)
GFR, Estimated: >60 mL/min (ref >60)
GFR, Estimated: >60 mL/min (ref >60)
GFR, Estimated: >60 mL/min (ref >60)
GFR, Estimated: >60 mL/min (ref >60)
Glucose, Bld: 131 mg/dL — ABNORMAL HIGH (ref 70–99)
Glucose, Bld: 136 mg/dL — ABNORMAL HIGH (ref 70–99)
Glucose, Bld: 165 mg/dL — ABNORMAL HIGH (ref 70–99)
Glucose, Bld: 193 mg/dL — ABNORMAL HIGH (ref 70–99)
Potassium: 3.5 mmol/L (ref 3.5–5.1)
Potassium: 3.8 mmol/L (ref 3.5–5.1)
Potassium: 4.4 mmol/L (ref 3.5–5.1)
Potassium: 4.5 mmol/L (ref 3.5–5.1)
Sodium: 146 mmol/L — ABNORMAL HIGH (ref 135–145)
Sodium: 147 mmol/L — ABNORMAL HIGH (ref 135–145)
Sodium: 147 mmol/L — ABNORMAL HIGH (ref 135–145)
Sodium: 148 mmol/L — ABNORMAL HIGH (ref 135–145)

## 2022-11-29 LAB — GLUCOSE, CAPILLARY
Glucose-Capillary: 175 mg/dL — ABNORMAL HIGH (ref 70–99)
Glucose-Capillary: 165 mg/dL — ABNORMAL HIGH (ref 70–99)
Glucose-Capillary: 137 mg/dL — ABNORMAL HIGH (ref 70–99)
Glucose-Capillary: 118 mg/dL — ABNORMAL HIGH (ref 70–99)
Glucose-Capillary: 127 mg/dL — ABNORMAL HIGH (ref 70–99)
Glucose-Capillary: 84 mg/dL (ref 70–99)
Glucose-Capillary: 154 mg/dL — ABNORMAL HIGH (ref 70–99)
Glucose-Capillary: 139 mg/dL — ABNORMAL HIGH (ref 70–99)
Glucose-Capillary: 132 mg/dL — ABNORMAL HIGH (ref 70–99)
Glucose-Capillary: 155 mg/dL — ABNORMAL HIGH (ref 70–99)

## 2022-11-29 LAB — HEPATIC FUNCTION PANEL
ALT: 675 U/L — ABNORMAL HIGH (ref 0–44)
AST: 81 U/L — ABNORMAL HIGH (ref 15–41)
Albumin: 1.6 g/dL — ABNORMAL LOW (ref 3.5–5.0)
Alkaline Phosphatase: 182 U/L — ABNORMAL HIGH (ref 38–126)
Bilirubin, Direct: 0.2 mg/dL (ref 0.0–0.2)
Indirect Bilirubin: 0.1 mg/dL — ABNORMAL LOW (ref 0.3–0.9)
Total Bilirubin: 0.3 mg/dL (ref 0.3–1.2)
Total Protein: 4.4 g/dL — ABNORMAL LOW (ref 6.5–8.1)

## 2022-11-29 LAB — MAGNESIUM
Magnesium: 1.7 mg/dL (ref 1.7–2.4)
Magnesium: 1.8 mg/dL (ref 1.7–2.4)

## 2022-11-29 LAB — CBC
HCT: 29.4 % — ABNORMAL LOW (ref 36.0–46.0)
Hemoglobin: 10.1 g/dL — ABNORMAL LOW (ref 12.0–15.0)
MCH: 28.4 pg (ref 26.0–34.0)
MCHC: 34.4 g/dL (ref 30.0–36.0)
MCV: 82.6 fL (ref 80.0–100.0)
Platelets: 62 10*3/uL — ABNORMAL LOW (ref 150–400)
RBC: 3.56 MIL/uL — ABNORMAL LOW (ref 3.87–5.11)
RDW: 17.3 % — ABNORMAL HIGH (ref 11.5–15.5)
WBC: 25 10*3/uL — ABNORMAL HIGH (ref 4.0–10.5)
nRBC: 0.1 % (ref 0.0–0.2)

## 2022-11-29 LAB — PHOSPHORUS
Phosphorus: <1 mg/dL — CL (ref 2.5–4.6)
Phosphorus: <1 mg/dL — CL (ref 2.5–4.6)

## 2022-11-29 LAB — BETA-HYDROXYBUTYRIC ACID
Beta-Hydroxybutyric Acid: 0.05 mmol/L (ref 0.05–0.27)
Beta-Hydroxybutyric Acid: 0.06 mmol/L (ref 0.05–0.27)

## 2022-11-29 MED ORDER — VITAL 1.5 CAL PO LIQD
1000.0000 mL | ORAL | Status: DC
  Administered 2022-11-30: 1000 mL

## 2022-11-29 MED ORDER — NOREPINEPHRINE 16 MG/250ML-% IV SOLN
0.0000 ug/min | INTRAVENOUS | Status: DC
  Administered 2022-11-29: 15 ug/min via INTRAVENOUS
  Administered 2022-12-01: 2 ug/min via INTRAVENOUS
  Filled 2022-11-29 (×2): qty 250

## 2022-11-29 MED ORDER — POTASSIUM CHLORIDE 10 MEQ/50ML IV SOLN
10.0000 meq | INTRAVENOUS | Status: AC
  Administered 2022-11-29 (×2): 10 meq via INTRAVENOUS
  Filled 2022-11-29: qty 50

## 2022-11-29 MED ORDER — SODIUM PHOSPHATES 45 MMOLE/15ML IV SOLN
45.0000 mmol | Freq: Once | INTRAVENOUS | Status: AC
  Administered 2022-11-29: 45 mmol via INTRAVENOUS
  Filled 2022-11-29: qty 15

## 2022-11-29 MED ORDER — INSULIN ASPART 100 UNIT/ML IJ SOLN
0.0000 [IU] | INTRAMUSCULAR | Status: DC
  Administered 2022-11-29: 2 [IU] via SUBCUTANEOUS
  Administered 2022-11-29: 1 [IU] via SUBCUTANEOUS
  Administered 2022-11-29: 2 [IU] via SUBCUTANEOUS
  Administered 2022-11-30: 1 [IU] via SUBCUTANEOUS
  Administered 2022-11-30: 2 [IU] via SUBCUTANEOUS
  Administered 2022-12-01: 3 [IU] via SUBCUTANEOUS
  Administered 2022-12-01: 1 [IU] via SUBCUTANEOUS

## 2022-11-29 MED ORDER — VITAL HIGH PROTEIN PO LIQD: 1000.0000 mL | ORAL | Status: DC

## 2022-11-29 MED ORDER — VASOPRESSIN 20 UNITS/100 ML INFUSION FOR SHOCK
0.0000 [IU]/min | INTRAVENOUS | Status: DC
  Administered 2022-11-29 – 2022-12-01 (×7): 0.04 [IU]/min via INTRAVENOUS
  Administered 2022-12-01: 0.01 [IU]/min via INTRAVENOUS
  Filled 2022-11-29 (×9): qty 100

## 2022-11-29 MED ORDER — SENNA 8.6 MG PO TABS
1.0000 | ORAL_TABLET | Freq: Every day | ORAL | Status: DC
  Administered 2022-11-29 – 2022-12-01 (×3): 8.6 mg
  Filled 2022-11-29 (×3): qty 1

## 2022-11-29 MED ORDER — VANCOMYCIN HCL 500 MG/100ML IV SOLN
500.0000 mg | INTRAVENOUS | Status: DC
  Administered 2022-11-29 – 2022-11-30 (×2): 500 mg via INTRAVENOUS
  Filled 2022-11-29 (×2): qty 100

## 2022-11-29 MED ORDER — INSULIN ASPART 100 UNIT/ML IJ SOLN
0.0000 [IU] | INTRAMUSCULAR | Status: DC
  Administered 2022-11-29: 1 [IU] via SUBCUTANEOUS

## 2022-11-29 MED ORDER — METOCLOPRAMIDE HCL 5 MG/ML IJ SOLN
INTRAMUSCULAR | Status: AC
  Filled 2022-11-29: qty 2

## 2022-11-29 MED ORDER — METOCLOPRAMIDE HCL 5 MG/ML IJ SOLN
5.0000 mg | Freq: Three times a day (TID) | INTRAMUSCULAR | Status: DC
  Administered 2022-11-29 – 2022-12-01 (×6): 5 mg via INTRAVENOUS
  Filled 2022-11-29 (×6): qty 2

## 2022-11-29 MED ORDER — INSULIN GLARGINE-YFGN 100 UNIT/ML ~~LOC~~ SOLN
20.0000 [IU] | SUBCUTANEOUS | Status: DC
  Administered 2022-11-29 – 2022-11-30 (×2): 20 [IU] via SUBCUTANEOUS
  Filled 2022-11-29 (×3): qty 0.2

## 2022-11-29 MED ORDER — DEXTROSE 10 % IV SOLN: INTRAVENOUS | Status: AC

## 2022-11-29 MED ORDER — POLYETHYLENE GLYCOL 3350 17 G PO PACK
17.0000 g | PACK | Freq: Two times a day (BID) | ORAL | Status: DC
  Administered 2022-11-29 – 2022-12-02 (×4): 17 g
  Filled 2022-11-29 (×5): qty 1

## 2022-11-29 NOTE — Progress Notes (Signed)
Pt's head turned to the pt's left side. ETT secured. Pt tolerated well.

## 2022-11-29 NOTE — Progress Notes (Signed)
RT NOTE: PT had episode of vomiting so placed back in supine position by RT and RN X 4. Patient desated to 80% but FiO2 increased to 100% and sats quickly came back up. Cloth tape removed and ETT secured with ETT holder. VSS. RT to await further orders.

## 2022-11-29 NOTE — Progress Notes (Signed)
   NAME:  [name], MRN:  [id number], DOB:  November 15, 1985, LOS: 1 ADMISSION DATE:  11/27/2022, CONSULTATION DATE:  11/28/22 REFERRING MD:  Marlon Pel , CHIEF COMPLAINT:  ams   History of Present Illness:  37 yo woman with hx of DM1, HFrEF, HTN, anxiety/depression, cocaine use, DKA, presenting with DKA and ARDS.  Pertinent  Medical History  Home lantus 40mg  daily  Mod sliding scale insulin  Augmentin for pna (on d/c 2/1 ) x 3 days   Significant Hospital Events: Including procedures, antibiotic start and stop dates in addition to other pertinent events   2/12 admit, intubation, proning  Interim History / Subjective:  No events.  paO2 improved with proning but ventilation got a little worse understandably.  Heavily sedated and proned.  Objective   Blood pressure 111/71, pulse 96, temperature 98.2 F (36.8 C), resp. rate (!) 35, height 5' (1.524 m), weight 39 kg, SpO2 100 %.    Vent Mode: PRVC FiO2 (%):  [70 %-100 %] 70 % Set Rate:  [30 bmp-35 bmp] 35 bmp Vt Set:  [360 mL-400 mL] 360 mL PEEP:  [10 cmH20-14 cmH20] 14 cmH20 Plateau Pressure:  [22 cmH20] 22 cmH20   Intake/Output Summary (Last 24 hours) at 11/29/2022 0735 Last data filed at 11/29/2022 0600 Gross per 24 hour  Intake 4228.42 ml  Output 1425 ml  Net 2803.42 ml   Filed Weights   11/27/22 2249  Weight: 39 kg    Examination: Proned woman sedated. Lungs with scattered crackles Ext warm Abd/heart exam deferred until supine  ABG pending Cr slightly better Some hypernatremia due to crytalloid and insensible losses BAL neutrophil predominant Culture pending  Resolved Hospital Problem list     Assessment & Plan:  DKA- resolved Acute hypoxemic and hypercarbic resp failure- ARDS Acute kidney injury- improved Acute metabolic encephalopathy- related to ARDS, DKA  - Continue HCAP coverage, f/u BAL data - ARDSnet PEEP/FiO2, attempt to limit driving pressures as allowed by lung compliance - VAP prevention  bundle -16/8h proning with A/a gradients < 150, adjust based on clinical response -Sedation to minimize shearing forces created by ventilator assynchrony - Keep even as able with balanced diuresis as tolerated by renal function - PRN NMB - Can probably transition off insulin gtt, will d/w RD and RN today   Best Practice (right click and "Reselect all SmartList Selections" daily)   Diet/type: NPO DVT prophylaxis: LMWH GI prophylaxis: PPI Lines: N/A Foley:  N/A Code Status:  full code Last date of multidisciplinary goals of care discussion [updated daughter and sister at bedside 2/12, still haven't talked to husband but they said they would relay message]  38 min cc time Erskine Emery MD PCCM

## 2022-11-29 NOTE — Progress Notes (Signed)
Pt't head turned to the right at this time. ETT secure. Pt tolerated well.

## 2022-11-29 NOTE — Consult Note (Signed)
ECMO Consult Note   Called to 3M04 for ECMO Consult at (time) 37 by Felipa Evener MD Admitting Diagnosis-altered mental status and respiratory failure. Primary Issue-hypoxic hypercapnic respiratory failure Age:37 y.o. Weight: 39 kg (BMI 16.79)  Days on Mechanical Ventilation-less than 1  MAP FiO2 Oxygen Index P/F Ratio  72 1.0 Not applicable 0000000    Vasopressors yes   MSOF Yes   RESP score (VV-ECMO) : -7 - 18% survival based on encephalopathy.  http://www.respscore.com  SAVE score (VA-ECMO) : Not applicable. http://www.save-score.com  Recent Blood Gas:     Component Value Date/Time   PHART 7.287 (L) 11/28/2022 2327   PCO2ART 60.1 (H) 11/28/2022 2327   PO2ART 162 (H) 11/28/2022 2327   HCO3 28.8 (H) 11/28/2022 2327   TCO2 31 11/28/2022 2327   ACIDBASEDEF 4.0 (H) 11/28/2022 1247   O2SAT 99 11/28/2022 2327    Coags:    Component Value Date/Time   INR 0.9 05/27/2022 0225   FIBRINOGEN >800 (H) 11/30/2020 0856    CBC    Component Value Date/Time   WBC 16.6 (H) 11/27/2022 2257   RBC 3.57 (L) 11/27/2022 2257   HGB 10.2 (L) 11/28/2022 2327   HCT 30.0 (L) 11/28/2022 2327   PLT 152 11/27/2022 2257   MCV 95.8 11/27/2022 2257   MCH 30.0 11/27/2022 2257   MCHC 31.3 11/27/2022 2257   RDW 18.6 (H) 11/27/2022 2257   LYMPHSABS 1.3 11/27/2022 2257   MONOABS 0.3 11/27/2022 2257   EOSABS 0.0 11/27/2022 2257   BASOSABS 0.1 11/27/2022 2257    BMET    Component Value Date/Time   NA 147 (H) 11/28/2022 2359   K 3.5 11/28/2022 2359   CL 114 (H) 11/28/2022 2359   CO2 28 11/28/2022 2359   GLUCOSE 193 (H) 11/28/2022 2359   BUN 25 (H) 11/28/2022 2359   CREATININE 1.01 (H) 11/28/2022 2359   CALCIUM 7.9 (L) 11/28/2022 2359   GFRNONAA >60 11/28/2022 2359   GFRAA >60 04/07/2020 0723                                                                                                                                                              Candidate meets ECMO Criteria- No    [name] is an 37 y.o. female.  HPI: 37 year old woman who presented with altered mental status and DKA. History of ongoing drug use.  Required intubation for worsening respiratory distress. Increasing lung infiltrates.  At the time of ECMO assessment the patient is on NMB, bicarb infusion and high dose vasopressors.   Past Medical History:  Diagnosis Date   Acid reflux 2010   CHF (congestive heart failure) (HCC)    Diabetes mellitus    DKA 05/01/2011   Hypertension    Polysubstance abuse ([place])  Past Surgical History:  Procedure Laterality Date   INCISION AND DRAINAGE ABSCESS Right 05/30/2022   Procedure: INCISION AND DRAINAGE ABSCESS;  Surgeon: Ronny Bacon, MD;  Location: [facility];  Service: General;  Laterality: Right;  Buttock/perirectal abscess   TUBAL LIGATION     WOUND EXPLORATION Left 07/26/2014   Procedure: Irrigation, debridement and exploration of elbow wounds;  Surgeon: Leanora Cover, MD;  Location: [place];  Service: Orthopedics;  Laterality: Left;    Family History  Problem Relation Age of Onset   Coronary artery disease Mother    Lung cancer Mother    Heart attack Father 65   Thyroid disease Maternal Grandmother    Diabetes type I Daughter     Social History:  reports that she has been smoking cigarettes. She has a 15.00 pack-year smoking history. She has been exposed to tobacco smoke. She has never used smokeless tobacco. She reports current drug use. Drugs: Benzodiazepines, Methamphetamines, and Cocaine. She reports that she does not drink alcohol.  Allergies:  Allergies  Allergen Reactions   Codeine Anaphylaxis and Nausea And Vomiting   Penicillins Rash    As a teenager   Tramadol Itching    Medications: I have reviewed the patient's current medications.  Results for orders placed or performed during the hospital encounter of 11/27/22 (from the past 48 hour(s))  CBG monitoring, ED     Status: Abnormal   Collection Time: 11/27/22 10:47  PM  Result Value Ref Range   Glucose-Capillary >600 (HH) 70 - 99 mg/dL    Comment: Glucose reference range applies only to samples taken after fasting for at least 8 hours.  CBC with Differential     Status: Abnormal   Collection Time: 11/27/22 10:57 PM  Result Value Ref Range   WBC 16.6 (H) 4.0 - 10.5 K/uL   RBC 3.57 (L) 3.87 - 5.11 MIL/uL   Hemoglobin 10.7 (L) 12.0 - 15.0 g/dL   HCT 34.2 (L) 36.0 - 46.0 %   MCV 95.8 80.0 - 100.0 fL   MCH 30.0 26.0 - 34.0 pg   MCHC 31.3 30.0 - 36.0 g/dL   RDW 18.6 (H) 11.5 - 15.5 %   Platelets 152 150 - 400 K/uL   nRBC 1.3 (H) 0.0 - 0.2 %   Neutrophils Relative % 88 %   Neutro Abs 14.6 (H) 1.7 - 7.7 K/uL   Lymphocytes Relative 8 %   Lymphs Abs 1.3 0.7 - 4.0 K/uL   Monocytes Relative 2 %   Monocytes Absolute 0.3 0.1 - 1.0 K/uL   Eosinophils Relative 0 %   Eosinophils Absolute 0.0 0.0 - 0.5 K/uL   Basophils Relative 0 %   Basophils Absolute 0.1 0.0 - 0.1 K/uL   WBC Morphology MILD LEFT SHIFT (1-5% METAS, OCC MYELO, OCC BANDS)     Comment: VACUOLATED NEUTROPHILS   RBC Morphology MORPHOLOGY UNREMARKABLE    Immature Granulocytes 2 %   Abs Immature Granulocytes 0.29 (H) 0.00 - 0.07 K/uL    Comment: Performed at [facility], [place] [address] [place], [place] [address]  Comprehensive metabolic panel     Status: Abnormal   Collection Time: 11/27/22 10:57 PM  Result Value Ref Range   Sodium 140 135 - 145 mmol/L   Potassium 4.8 3.5 - 5.1 mmol/L   Chloride 104 98 - 111 mmol/L   CO2 <7 (L) 22 - 32 mmol/L   Glucose, Bld 891 (HH) 70 - 99 mg/dL    Comment: CRITICAL RESULT CALLED TO,  READ BACK BY AND VERIFIED WITH L.REYNOLDS,RN. 2354 11/27/22. LPAIT Glucose reference range applies only to samples taken after fasting for at least 8 hours.    BUN 37 (H) 6 - 20 mg/dL   Creatinine, Ser 1.89 (H) 0.44 - 1.00 mg/dL   Calcium 9.0 8.9 - 10.3 mg/dL   Total Protein 4.9 (L) 6.5 - 8.1 g/dL   Albumin 2.1 (L) 3.5 - 5.0 g/dL   AST 105 (H) 15 - 41 U/L   ALT  1,121 (H) 0 - 44 U/L   Alkaline Phosphatase 202 (H) 38 - 126 U/L   Total Bilirubin 2.1 (H) 0.3 - 1.2 mg/dL   GFR, Estimated 34 (L) >60 mL/min    Comment: (NOTE) Calculated using the CKD-EPI Creatinine Equation (2021)    Anion gap NOT CALCULATED 5 - 15    Comment: Performed at [facility], [place] [address] [place], [place] [address]  Lactic acid, plasma     Status: Abnormal   Collection Time: 11/27/22 10:57 PM  Result Value Ref Range   Lactic Acid, Venous 3.8 (HH) 0.5 - 1.9 mmol/L    Comment: CRITICAL RESULT CALLED TO, READ BACK BY AND VERIFIED WITH L.REYNOLDS,RN. 2336 11/27/22. LPAIT Performed at [facility], [place] [address], [place], [place] [address]   I-stat chem 8, ED (not at [facility], [facility] or [facility])     Status: Abnormal   Collection Time: 11/27/22 10:58 PM  Result Value Ref Range   Sodium 141 135 - 145 mmol/L   Potassium 4.9 3.5 - 5.1 mmol/L   Chloride 115 (H) 98 - 111 mmol/L   BUN 35 (H) 6 - 20 mg/dL   Creatinine, Ser 1.00 0.44 - 1.00 mg/dL   Glucose, Bld >700 (HH) 70 - 99 mg/dL    Comment: Glucose reference range applies only to samples taken after fasting for at least 8 hours.   Calcium, Ion 1.29 1.15 - 1.40 mmol/L   TCO2 7 (L) 22 - 32 mmol/L   Hemoglobin 12.2 12.0 - 15.0 g/dL   HCT 36.0 36.0 - 46.0 %   Comment NOTIFIED PHYSICIAN   I-Stat venous blood gas, ED     Status: Abnormal   Collection Time: 11/27/22 10:58 PM  Result Value Ref Range   pH, Ven 6.927 (LL) 7.25 - 7.43   pCO2, Ven 18.0 (LL) 44 - 60 mmHg   pO2, Ven 62 (H) 32 - 45 mmHg   Bicarbonate 3.8 (L) 20.0 - 28.0 mmol/L   TCO2 <5 (L) 22 - 32 mmol/L   O2 Saturation 74 %   Acid-base deficit 27.0 (H) 0.0 - 2.0 mmol/L   Sodium 139 135 - 145 mmol/L   Potassium 4.9 3.5 - 5.1 mmol/L   Calcium, Ion 1.27 1.15 - 1.40 mmol/L   HCT 35.0 (L) 36.0 - 46.0 %   Hemoglobin 11.9 (L) 12.0 - 15.0 g/dL   Sample type VENOUS    Comment NOTIFIED PHYSICIAN   Resp panel by RT-PCR (RSV, Flu A&B, Covid) Anterior Nasal Swab      Status: None   Collection Time: 11/27/22 11:16 PM   Specimen: Anterior Nasal Swab  Result Value Ref Range   SARS Coronavirus 2 by RT PCR NEGATIVE NEGATIVE   Influenza A by PCR NEGATIVE NEGATIVE   Influenza B by PCR NEGATIVE NEGATIVE    Comment: (NOTE) The Xpert Xpress SARS-CoV-2/FLU/RSV plus assay is intended as an aid in the diagnosis of influenza from Nasopharyngeal swab specimens and should not be used as  a sole basis for treatment. Nasal washings and aspirates are unacceptable for Xpert Xpress SARS-CoV-2/FLU/RSV testing.  Fact Sheet for Patients: EntrepreneurPulse.com.au  Fact Sheet for Healthcare Providers: IncredibleEmployment.be  This test is not yet approved or cleared by the [place] FDA and has been authorized for detection and/or diagnosis of SARS-CoV-2 by FDA under an Emergency Use Authorization (EUA). This EUA will remain in effect (meaning this test can be used) for the duration of the COVID-19 declaration under Section 564(b)(1) of the Act, 21 U.S.C. section 360bbb-3(b)(1), unless the authorization is terminated or revoked.     Resp Syncytial Virus by PCR NEGATIVE NEGATIVE    Comment: (NOTE) Fact Sheet for Patients: EntrepreneurPulse.com.au  Fact Sheet for Healthcare Providers: IncredibleEmployment.be  This test is not yet approved or cleared by the [place] FDA and has been authorized for detection and/or diagnosis of SARS-CoV-2 by FDA under an Emergency Use Authorization (EUA). This EUA will remain in effect (meaning this test can be used) for the duration of the COVID-19 declaration under Section 564(b)(1) of the Act, 21 U.S.C. section 360bbb-3(b)(1), unless the authorization is terminated or revoked.  Performed at [facility], [place] [address], [place], [place] [address]   I-Stat beta hCG blood, ED     Status: None   Collection Time: 11/28/22 12:22 AM  Result Value  Ref Range   I-stat hCG, quantitative <5.0 <5 mIU/mL   Comment 3            Comment:   GEST. AGE      CONC.  (mIU/mL)   <=1 WEEK        5 - 50     2 WEEKS       50 - 500     3 WEEKS       100 - 10,000     4 WEEKS     1,000 - 30,000        FEMALE AND NON-PREGNANT FEMALE:     LESS THAN 5 mIU/mL   CBG monitoring, ED     Status: Abnormal   Collection Time: 11/28/22 12:42 AM  Result Value Ref Range   Glucose-Capillary >600 (HH) 70 - 99 mg/dL    Comment: Glucose reference range applies only to samples taken after fasting for at least 8 hours.  I-Stat arterial blood gas, ED ([facility], [facility], [facility])     Status: Abnormal   Collection Time: 11/28/22  1:09 AM  Result Value Ref Range   pH, Arterial 6.954 (LL) 7.35 - 7.45   pCO2 arterial 23.6 (L) 32 - 48 mmHg   pO2, Arterial 48 (L) 83 - 108 mmHg   Bicarbonate 5.5 (L) 20.0 - 28.0 mmol/L   TCO2 6 (L) 22 - 32 mmol/L   O2 Saturation 68 %   Acid-base deficit 26.0 (H) 0.0 - 2.0 mmol/L   Sodium 144 135 - 145 mmol/L   Potassium 4.1 3.5 - 5.1 mmol/L   Calcium, Ion 1.33 1.15 - 1.40 mmol/L   HCT 34.0 (L) 36.0 - 46.0 %   Hemoglobin 11.6 (L) 12.0 - 15.0 g/dL   Patient temperature 94.0 F    Collection site RADIAL, ALLEN'S TEST ACCEPTABLE    Drawn by RT    Sample type ARTERIAL    Comment NOTIFIED PHYSICIAN   Lactic acid, plasma     Status: Abnormal   Collection Time: 11/28/22  1:11 AM  Result Value Ref Range   Lactic Acid, Venous 3.5 (HH) 0.5 - 1.9 mmol/L  Comment: CRITICAL VALUE NOTED. VALUE IS CONSISTENT WITH PREVIOUSLY REPORTED/CALLED VALUE Performed at [facility], [place] [address] [place], [place] [address]   Beta-hydroxybutyric acid     Status: Abnormal   Collection Time: 11/28/22  1:11 AM  Result Value Ref Range   Beta-Hydroxybutyric Acid >8.00 (H) 0.05 - 0.27 mmol/L    Comment: RESULT CONFIRMED BY MANUAL DILUTION Performed at [facility] [address], [place], [place] [id number]   Basic metabolic panel     Status: Abnormal    Collection Time: 11/28/22  1:11 AM  Result Value Ref Range   Sodium 143 135 - 145 mmol/L   Potassium 4.2 3.5 - 5.1 mmol/L   Chloride 110 98 - 111 mmol/L   CO2 <7 (L) 22 - 32 mmol/L   Glucose, Bld 743 (HH) 70 - 99 mg/dL    Comment: CRITICAL RESULT CALLED TO, READ BACK BY AND VERIFIED WITH B. CUMMINGS, RN AT [id number] ON 11/28/22 BY H. HOWARD. Glucose reference range applies only to samples taken after fasting for at least 8 hours.    BUN 33 (H) 6 - 20 mg/dL   Creatinine, Ser 1.62 (H) 0.44 - 1.00 mg/dL   Calcium 8.1 (L) 8.9 - 10.3 mg/dL   GFR, Estimated 41 (L) >60 mL/min    Comment: (NOTE) Calculated using the CKD-EPI Creatinine Equation (2021)    Anion gap NOT CALCULATED 5 - 15    Comment: Performed at [facility] [address] [place], [place] [address]  Ethanol     Status: None   Collection Time: 11/28/22  1:11 AM  Result Value Ref Range   Alcohol, Ethyl (B) <10 <10 mg/dL    Comment: (NOTE) Lowest detectable limit for serum alcohol is 10 mg/dL.  For medical purposes only. Performed at [facility], [place] [address], [place], [place] [address]   Lipase, blood     Status: None   Collection Time: 11/28/22  1:11 AM  Result Value Ref Range   Lipase 38 11 - 51 U/L    Comment: Performed at [facility] [address], [place], [place] [address]  CBG monitoring, ED     Status: Abnormal   Collection Time: 11/28/22  1:19 AM  Result Value Ref Range   Glucose-Capillary 550 (HH) 70 - 99 mg/dL    Comment: Glucose reference range applies only to samples taken after fasting for at least 8 hours.   Comment 1 Document in Chart   Glucose, capillary     Status: Abnormal   Collection Time: 11/28/22  2:09 AM  Result Value Ref Range   Glucose-Capillary 580 (HH) 70 - 99 mg/dL    Comment: Glucose reference range applies only to samples taken after fasting for at least 8 hours.  Glucose, capillary     Status: Abnormal   Collection Time: 11/28/22  2:59 AM  Result Value Ref Range    Glucose-Capillary >600 (HH) 70 - 99 mg/dL    Comment: Glucose reference range applies only to samples taken after fasting for at least 8 hours.  Glucose, capillary     Status: Abnormal   Collection Time: 11/28/22  3:28 AM  Result Value Ref Range   Glucose-Capillary >600 (HH) 70 - 99 mg/dL    Comment: Glucose reference range applies only to samples taken after fasting for at least 8 hours.  Glucose, capillary     Status: Abnormal   Collection Time: 11/28/22  4:23 AM  Result  Value Ref Range   Glucose-Capillary >600 (HH) 70 - 99 mg/dL    Comment: Glucose reference range applies only to samples taken after fasting for at least 8 hours.  Glucose, capillary     Status: Abnormal   Collection Time: 11/28/22  5:11 AM  Result Value Ref Range   Glucose-Capillary 479 (H) 70 - 99 mg/dL    Comment: Glucose reference range applies only to samples taken after fasting for at least 8 hours.  Glucose, capillary     Status: Abnormal   Collection Time: 11/28/22  6:19 AM  Result Value Ref Range   Glucose-Capillary 549 (HH) 70 - 99 mg/dL    Comment: Glucose reference range applies only to samples taken after fasting for at least 8 hours.  Glucose, capillary     Status: Abnormal   Collection Time: 11/28/22  6:56 AM  Result Value Ref Range   Glucose-Capillary 416 (H) 70 - 99 mg/dL    Comment: Glucose reference range applies only to samples taken after fasting for at least 8 hours.  Beta-hydroxybutyric acid     Status: Abnormal   Collection Time: 11/28/22  7:06 AM  Result Value Ref Range   Beta-Hydroxybutyric Acid >8.00 (H) 0.05 - 0.27 mmol/L    Comment: RESULT CONFIRMED BY MANUAL DILUTION Performed at [facility] [address], [place], [place] [id number]   Basic metabolic panel     Status: Abnormal   Collection Time: 11/28/22  7:06 AM  Result Value Ref Range   Sodium 147 (H) 135 - 145 mmol/L   Potassium 3.4 (L) 3.5 - 5.1 mmol/L   Chloride 110 98 - 111 mmol/L   CO2 14 (L) 22 - 32 mmol/L    Glucose, Bld 560 (HH) 70 - 99 mg/dL    Comment: RESULTS VERIFIED BY REPEAT TESTING CRITICAL RESULT CALLED TO, READ BACK BY AND VERIFIED WITH Brandt Loosen, [place] 11/28/22 L. KLAR Glucose reference range applies only to samples taken after fasting for at least 8 hours.    BUN 30 (H) 6 - 20 mg/dL   Creatinine, Ser 1.37 (H) 0.44 - 1.00 mg/dL   Calcium 8.1 (L) 8.9 - 10.3 mg/dL   GFR, Estimated 51 (L) >60 mL/min    Comment: (NOTE) Calculated using the CKD-EPI Creatinine Equation (2021)    Anion gap 23 (H) 5 - 15    Comment: ELECTROLYTES REPEATED TO VERIFY Performed at [facility] [address], [place], [place] [address]   Ammonia     Status: Abnormal   Collection Time: 11/28/22  7:06 AM  Result Value Ref Range   Ammonia 62 (H) 9 - 35 umol/L    Comment: Performed at [facility], [place] [address], [place], [place] [address]  Glucose, capillary     Status: Abnormal   Collection Time: 11/28/22  7:42 AM  Result Value Ref Range   Glucose-Capillary 494 (H) 70 - 99 mg/dL    Comment: Glucose reference range applies only to samples taken after fasting for at least 8 hours.  Rapid urine drug screen (hospital performed)     Status: None   Collection Time: 11/28/22  8:02 AM  Result Value Ref Range   Opiates NONE DETECTED NONE DETECTED   Cocaine NONE DETECTED NONE DETECTED   Benzodiazepines NONE DETECTED NONE DETECTED   Amphetamines NONE DETECTED NONE DETECTED   Tetrahydrocannabinol NONE DETECTED NONE DETECTED   Barbiturates NONE DETECTED NONE DETECTED    Comment: (NOTE) DRUG SCREEN FOR  MEDICAL PURPOSES ONLY.  IF CONFIRMATION IS NEEDED FOR ANY PURPOSE, NOTIFY LAB WITHIN 5 DAYS.  LOWEST DETECTABLE LIMITS FOR URINE DRUG SCREEN Drug Class                     Cutoff (ng/mL) Amphetamine and metabolites    1000 Barbiturate and metabolites    200 Benzodiazepine                 200 Opiates and metabolites        300 Cocaine and metabolites        300 THC                             50 Performed at [facility], [place] [address], [place], [place] [address]   Urinalysis, Routine w reflex microscopic -Urine, Clean Catch     Status: Abnormal   Collection Time: 11/28/22  8:02 AM  Result Value Ref Range   Color, Urine YELLOW YELLOW   APPearance HAZY (A) CLEAR   Specific Gravity, Urine 1.017 1.005 - 1.030   pH 5.0 5.0 - 8.0   Glucose, UA >=500 (A) NEGATIVE mg/dL   Hgb urine dipstick MODERATE (A) NEGATIVE   Bilirubin Urine NEGATIVE NEGATIVE   Ketones, ur 20 (A) NEGATIVE mg/dL   Protein, ur 30 (A) NEGATIVE mg/dL   Nitrite NEGATIVE NEGATIVE   Leukocytes,Ua MODERATE (A) NEGATIVE   RBC / HPF 21-50 0 - 5 RBC/hpf   WBC, UA 21-50 0 - 5 WBC/hpf   Bacteria, UA RARE (A) NONE SEEN   Squamous Epithelial / HPF 0-5 0 - 5 /HPF   WBC Clumps PRESENT    Mucus PRESENT    Hyaline Casts, UA PRESENT    Granular Casts, UA PRESENT     Comment: Performed at [facility], [place] [address], [place], [place] [address]  Blood gas, venous     Status: Abnormal   Collection Time: 11/28/22  8:30 AM  Result Value Ref Range   pH, Ven 7.33 7.25 - 7.43   pCO2, Ven 36 (L) 44 - 60 mmHg   pO2, Ven 49 (H) 32 - 45 mmHg   Bicarbonate 19.1 (L) 20.0 - 28.0 mmol/L   Acid-base deficit 6.4 (H) 0.0 - 2.0 mmol/L   O2 Saturation 87.3 %   Patient temperature 36.6    Collection site RIGHT AC    Drawn by [id number]     Comment: Performed at [facility] [address], [place], [place] [address]  Glucose, capillary     Status: Abnormal   Collection Time: 11/28/22  8:30 AM  Result Value Ref Range   Glucose-Capillary 475 (H) 70 - 99 mg/dL    Comment: Glucose reference range applies only to samples taken after fasting for at least 8 hours.  Hepatitis panel, acute     Status: None   Collection Time: 11/28/22  8:31 AM  Result Value Ref Range   Hepatitis B Surface Ag NON REACTIVE NON REACTIVE   HCV Ab NON REACTIVE NON REACTIVE    Comment: (NOTE) Nonreactive HCV antibody screen is consistent with no  HCV infections,  unless recent infection is suspected or other evidence exists to indicate HCV infection.     Hep A IgM NON REACTIVE NON REACTIVE   Hep B C IgM NON REACTIVE NON REACTIVE    Comment: Performed at [facility], [place],  [place] [address]  Acetaminophen level     Status: Abnormal   Collection Time: 11/28/22  8:31 AM  Result Value Ref Range   Acetaminophen (Tylenol), Serum <10 (L) 10 - 30 ug/mL    Comment: (NOTE) Therapeutic concentrations vary significantly. A range of 10-30 ug/mL  may be an effective concentration for many patients. However, some  are best treated at concentrations outside of this range. Acetaminophen concentrations >150 ug/mL at 4 hours after ingestion  and >50 ug/mL at 12 hours after ingestion are often associated with  toxic reactions.  Performed at [facility], [place] [address], [place], [place] [address]   Procalcitonin - Baseline     Status: None   Collection Time: 11/28/22  8:31 AM  Result Value Ref Range   Procalcitonin 1.99 ng/mL    Comment:        Interpretation: PCT > 0.5 ng/mL and <= 2 ng/mL: Systemic infection (sepsis) is possible, but other conditions are known to elevate PCT as well. (NOTE)       Sepsis PCT Algorithm           Lower Respiratory Tract                                      Infection PCT Algorithm    ----------------------------     ----------------------------         PCT < 0.25 ng/mL                PCT < 0.10 ng/mL          Strongly encourage             Strongly discourage   discontinuation of antibiotics    initiation of antibiotics    ----------------------------     -----------------------------       PCT 0.25 - 0.50 ng/mL            PCT 0.10 - 0.25 ng/mL               OR       >80% decrease in PCT            Discourage initiation of                                            antibiotics      Encourage discontinuation           of antibiotics    ----------------------------      -----------------------------         PCT >= 0.50 ng/mL              PCT 0.26 - 0.50 ng/mL                AND       <80% decrease in PCT             Encourage initiation of                                             antibiotics       Encourage continuation           of antibiotics    ----------------------------     -----------------------------  PCT >= 0.50 ng/mL                  PCT > 0.50 ng/mL               AND         increase in PCT                  Strongly encourage                                      initiation of antibiotics    Strongly encourage escalation           of antibiotics                                     -----------------------------                                           PCT <= 0.25 ng/mL                                                 OR                                        > 80% decrease in PCT                                      Discontinue / Do not initiate                                             antibiotics  Performed at [facility], [address] [place], [place] [address]   Glucose, capillary     Status: Abnormal   Collection Time: 11/28/22  9:55 AM  Result Value Ref Range   Glucose-Capillary 435 (H) 70 - 99 mg/dL    Comment: Glucose reference range applies only to samples taken after fasting for at least 8 hours.  Glucose, capillary     Status: Abnormal   Collection Time: 11/28/22 10:27 AM  Result Value Ref Range   Glucose-Capillary 467 (H) 70 - 99 mg/dL    Comment: Glucose reference range applies only to samples taken after fasting for at least 8 hours.  Glucose, capillary     Status: Abnormal   Collection Time: 11/28/22 11:10 AM  Result Value Ref Range   Glucose-Capillary 415 (H) 70 - 99 mg/dL    Comment: Glucose reference range applies only to samples taken after fasting for at least 8 hours.  Respiratory (~20 pathogens) panel by PCR     Status: None   Collection Time: 11/28/22 11:24 AM   Specimen: Nasopharyngeal  Swab; Respiratory  Result Value Ref Range   Adenovirus NOT DETECTED NOT DETECTED   Coronavirus 229E NOT DETECTED NOT  DETECTED    Comment: (NOTE) The Coronavirus on the Respiratory Panel, DOES NOT test for the novel  Coronavirus (2019 nCoV)    Coronavirus HKU1 NOT DETECTED NOT DETECTED   Coronavirus NL63 NOT DETECTED NOT DETECTED   Coronavirus OC43 NOT DETECTED NOT DETECTED   Metapneumovirus NOT DETECTED NOT DETECTED   Rhinovirus / Enterovirus NOT DETECTED NOT DETECTED   Influenza A NOT DETECTED NOT DETECTED   Influenza B NOT DETECTED NOT DETECTED   Parainfluenza Virus 1 NOT DETECTED NOT DETECTED   Parainfluenza Virus 2 NOT DETECTED NOT DETECTED   Parainfluenza Virus 3 NOT DETECTED NOT DETECTED   Parainfluenza Virus 4 NOT DETECTED NOT DETECTED   Respiratory Syncytial Virus NOT DETECTED NOT DETECTED   Bordetella pertussis NOT DETECTED NOT DETECTED   Bordetella Parapertussis NOT DETECTED NOT DETECTED   Chlamydophila pneumoniae NOT DETECTED NOT DETECTED   Mycoplasma pneumoniae NOT DETECTED NOT DETECTED    Comment: Performed at [facility] [address], [place], [place] [address]  Culture, Respiratory w Gram Stain     Status: None (Preliminary result)   Collection Time: 11/28/22 11:27 AM   Specimen: Bronchoalveolar Lavage; Respiratory  Result Value Ref Range   Specimen Description BRONCHIAL ALVEOLAR LAVAGE    Special Requests NONE    Gram Stain      NO WBC SEEN NO ORGANISMS SEEN Performed at [facility], [place] [address] [place], [place] [address]    Culture PENDING    Report Status PENDING   Body fluid cell count with differential     Status: Abnormal   Collection Time: 11/28/22 11:27 AM  Result Value Ref Range   Fluid Type-FCT BRONCHIAL ALVEOLAR LAVAGE     Comment: CORRECTED ON 02/12 AT 1326: PREVIOUSLY REPORTED AS Bronch Lavag   Color, Fluid STRAW    Appearance, Fluid HAZY (A) CLEAR   Total Nucleated Cell Count, Fluid 54 0 - 1,000 cu mm   Neutrophil Count,  Fluid 96 (H) 0 - 25 %   Lymphs, Fluid 4 %   Monocyte-Macrophage-Serous Fluid 0 (L) 50 - 90 %   Eos, Fluid 0 %    Comment: Performed at [facility] [address], [place], [place] [address]  Glucose, capillary     Status: Abnormal   Collection Time: 11/28/22 11:55 AM  Result Value Ref Range   Glucose-Capillary 503 (HH) 70 - 99 mg/dL    Comment: Glucose reference range applies only to samples taken after fasting for at least 8 hours.   Comment 1 Notify RN   Glucose, capillary     Status: Abnormal   Collection Time: 11/28/22 12:30 PM  Result Value Ref Range   Glucose-Capillary 370 (H) 70 - 99 mg/dL    Comment: Glucose reference range applies only to samples taken after fasting for at least 8 hours.  Basic metabolic panel     Status: Abnormal   Collection Time: 11/28/22 12:32 PM  Result Value Ref Range   Sodium 146 (H) 135 - 145 mmol/L   Potassium 3.5 3.5 - 5.1 mmol/L   Chloride 113 (H) 98 - 111 mmol/L   CO2 22 22 - 32 mmol/L   Glucose, Bld 395 (H) 70 - 99 mg/dL    Comment: Glucose reference range applies only to samples taken after fasting for at least 8 hours.   BUN 28 (H) 6 - 20 mg/dL   Creatinine, Ser 1.11 (H) 0.44 - 1.00 mg/dL   Calcium 8.1 (L) 8.9 - 10.3  mg/dL   GFR, Estimated >60 >60 mL/min    Comment: (NOTE) Calculated using the CKD-EPI Creatinine Equation (2021)    Anion gap 11 5 - 15    Comment: Performed at [facility], [place] [address] [place], [place] [address]  Hepatic function panel     Status: Abnormal   Collection Time: 11/28/22 12:32 PM  Result Value Ref Range   Total Protein 4.3 (L) 6.5 - 8.1 g/dL   Albumin 1.7 (L) 3.5 - 5.0 g/dL   AST 106 (H) 15 - 41 U/L   ALT 820 (H) 0 - 44 U/L   Alkaline Phosphatase 174 (H) 38 - 126 U/L   Total Bilirubin 0.9 0.3 - 1.2 mg/dL   Bilirubin, Direct 0.1 0.0 - 0.2 mg/dL   Indirect Bilirubin 0.8 0.3 - 0.9 mg/dL    Comment: Performed at [facility] [address] [place], [place] [address]  I-STAT 7,  (LYTES, BLD GAS, ICA, H+H)     Status: Abnormal   Collection Time: 11/28/22 12:47 PM  Result Value Ref Range   pH, Arterial 7.241 (L) 7.35 - 7.45   pCO2 arterial 54.0 (H) 32 - 48 mmHg   pO2, Arterial 127 (H) 83 - 108 mmHg   Bicarbonate 23.1 20.0 - 28.0 mmol/L   TCO2 25 22 - 32 mmol/L   O2 Saturation 98 %   Acid-base deficit 4.0 (H) 0.0 - 2.0 mmol/L   Sodium 152 (H) 135 - 145 mmol/L   Potassium 3.6 3.5 - 5.1 mmol/L   Calcium, Ion 1.35 1.15 - 1.40 mmol/L   HCT 32.0 (L) 36.0 - 46.0 %   Hemoglobin 10.9 (L) 12.0 - 15.0 g/dL   Patient temperature 37.3 C    Collection site RADIAL, ALLEN'S TEST ACCEPTABLE    Drawn by Operator    Sample type ARTERIAL   Glucose, capillary     Status: Abnormal   Collection Time: 11/28/22  1:23 PM  Result Value Ref Range   Glucose-Capillary 341 (H) 70 - 99 mg/dL    Comment: Glucose reference range applies only to samples taken after fasting for at least 8 hours.  Glucose, capillary     Status: Abnormal   Collection Time: 11/28/22  2:23 PM  Result Value Ref Range   Glucose-Capillary 365 (H) 70 - 99 mg/dL    Comment: Glucose reference range applies only to samples taken after fasting for at least 8 hours.  Beta-hydroxybutyric acid     Status: Abnormal   Collection Time: 11/28/22  3:26 PM  Result Value Ref Range   Beta-Hydroxybutyric Acid 0.49 (H) 0.05 - 0.27 mmol/L    Comment: Performed at [facility], [place] [address], [place], [place] [id number]  Basic metabolic panel     Status: Abnormal   Collection Time: 11/28/22  3:26 PM  Result Value Ref Range   Sodium 147 (H) 135 - 145 mmol/L   Potassium 4.0 3.5 - 5.1 mmol/L   Chloride 112 (H) 98 - 111 mmol/L   CO2 27 22 - 32 mmol/L   Glucose, Bld 332 (H) 70 - 99 mg/dL    Comment: Glucose reference range applies only to samples taken after fasting for at least 8 hours.   BUN 27 (H) 6 - 20 mg/dL   Creatinine, Ser 1.08 (H) 0.44 - 1.00 mg/dL   Calcium 7.8 (L) 8.9 - 10.3 mg/dL   GFR, Estimated >60 >60 mL/min     Comment: (NOTE) Calculated using the CKD-EPI Creatinine  Equation (2021)    Anion gap 8 5 - 15    Comment: Performed at [facility], [place] [address] [place], [place] [address]  Glucose, capillary     Status: Abnormal   Collection Time: 11/28/22  3:27 PM  Result Value Ref Range   Glucose-Capillary 330 (H) 70 - 99 mg/dL    Comment: Glucose reference range applies only to samples taken after fasting for at least 8 hours.  Glucose, capillary     Status: Abnormal   Collection Time: 11/28/22  4:26 PM  Result Value Ref Range   Glucose-Capillary 283 (H) 70 - 99 mg/dL    Comment: Glucose reference range applies only to samples taken after fasting for at least 8 hours.  Resp panel by RT-PCR (RSV, Flu A&B, Covid) Anterior Nasal Swab     Status: None   Collection Time: 11/28/22  4:31 PM   Specimen: Anterior Nasal Swab  Result Value Ref Range   SARS Coronavirus 2 by RT PCR NEGATIVE NEGATIVE   Influenza A by PCR NEGATIVE NEGATIVE   Influenza B by PCR NEGATIVE NEGATIVE    Comment: (NOTE) The Xpert Xpress SARS-CoV-2/FLU/RSV plus assay is intended as an aid in the diagnosis of influenza from Nasopharyngeal swab specimens and should not be used as a sole basis for treatment. Nasal washings and aspirates are unacceptable for Xpert Xpress SARS-CoV-2/FLU/RSV testing.  Fact Sheet for Patients: EntrepreneurPulse.com.au  Fact Sheet for Healthcare Providers: IncredibleEmployment.be  This test is not yet approved or cleared by the [place] FDA and has been authorized for detection and/or diagnosis of SARS-CoV-2 by FDA under an Emergency Use Authorization (EUA). This EUA will remain in effect (meaning this test can be used) for the duration of the COVID-19 declaration under Section 564(b)(1) of the Act, 21 U.S.C. section 360bbb-3(b)(1), unless the authorization is terminated or revoked.     Resp Syncytial Virus by PCR NEGATIVE NEGATIVE    Comment:  (NOTE) Fact Sheet for Patients: EntrepreneurPulse.com.au  Fact Sheet for Healthcare Providers: IncredibleEmployment.be  This test is not yet approved or cleared by the [place] FDA and has been authorized for detection and/or diagnosis of SARS-CoV-2 by FDA under an Emergency Use Authorization (EUA). This EUA will remain in effect (meaning this test can be used) for the duration of the COVID-19 declaration under Section 564(b)(1) of the Act, 21 U.S.C. section 360bbb-3(b)(1), unless the authorization is terminated or revoked.  Performed at [facility], [place] [address] [place], [place] [address]   I-STAT 7, (LYTES, BLD GAS, ICA, H+H)     Status: Abnormal   Collection Time: 11/28/22  4:36 PM  Result Value Ref Range   pH, Arterial 7.359 7.35 - 7.45   pCO2 arterial 45.1 32 - 48 mmHg   pO2, Arterial 92 83 - 108 mmHg   Bicarbonate 26.8 20.0 - 28.0 mmol/L   TCO2 28 22 - 32 mmol/L   O2 Saturation 98 %   Acid-Base Excess 0.0 0.0 - 2.0 mmol/L   Sodium 150 (H) 135 - 145 mmol/L   Potassium 4.2 3.5 - 5.1 mmol/L   Calcium, Ion 1.29 1.15 - 1.40 mmol/L   HCT 30.0 (L) 36.0 - 46.0 %   Hemoglobin 10.2 (L) 12.0 - 15.0 g/dL   Patient temperature 32.4 C    Collection site art line    Drawn by Operator    Sample type ARTERIAL   Glucose, capillary     Status: Abnormal   Collection Time: 11/28/22  6:25 PM  Result Value Ref Range   Glucose-Capillary 236 (H) 70 - 99 mg/dL    Comment: Glucose reference range applies only to samples taken after fasting for at least 8 hours.  I-STAT 7, (LYTES, BLD GAS, ICA, H+H)     Status: Abnormal   Collection Time: 11/28/22  7:20 PM  Result Value Ref Range   pH, Arterial 7.235 (L) 7.35 - 7.45   pCO2 arterial 67.8 (HH) 32 - 48 mmHg   pO2, Arterial 105 83 - 108 mmHg   Bicarbonate 28.5 (H) 20.0 - 28.0 mmol/L   TCO2 30 22 - 32 mmol/L   O2 Saturation 96 %   Acid-Base Excess 0.0 0.0 - 2.0 mmol/L   Sodium 150 (H) 135 -  145 mmol/L   Potassium 3.9 3.5 - 5.1 mmol/L   Calcium, Ion 1.31 1.15 - 1.40 mmol/L   HCT 31.0 (L) 36.0 - 46.0 %   Hemoglobin 10.5 (L) 12.0 - 15.0 g/dL   Patient temperature 37.5 C    Sample type ARTERIAL    Comment NOTIFIED PHYSICIAN   Glucose, capillary     Status: Abnormal   Collection Time: 11/28/22  7:21 PM  Result Value Ref Range   Glucose-Capillary 235 (H) 70 - 99 mg/dL    Comment: Glucose reference range applies only to samples taken after fasting for at least 8 hours.  Glucose, capillary     Status: Abnormal   Collection Time: 11/28/22  9:03 PM  Result Value Ref Range   Glucose-Capillary 215 (H) 70 - 99 mg/dL    Comment: Glucose reference range applies only to samples taken after fasting for at least 8 hours.  I-STAT 7, (LYTES, BLD GAS, ICA, H+H)     Status: Abnormal   Collection Time: 11/28/22  9:05 PM  Result Value Ref Range   pH, Arterial 7.261 (L) 7.35 - 7.45   pCO2 arterial 63.3 (H) 32 - 48 mmHg   pO2, Arterial 253 (H) 83 - 108 mmHg   Bicarbonate 28.5 (H) 20.0 - 28.0 mmol/L   TCO2 30 22 - 32 mmol/L   O2 Saturation 100 %   Acid-Base Excess 1.0 0.0 - 2.0 mmol/L   Sodium 151 (H) 135 - 145 mmol/L   Potassium 3.8 3.5 - 5.1 mmol/L   Calcium, Ion 1.32 1.15 - 1.40 mmol/L   HCT 30.0 (L) 36.0 - 46.0 %   Hemoglobin 10.2 (L) 12.0 - 15.0 g/dL   Patient temperature 37.0 C    Collection site art line    Drawn by RT    Sample type ARTERIAL   Glucose, capillary     Status: Abnormal   Collection Time: 11/28/22  9:59 PM  Result Value Ref Range   Glucose-Capillary 204 (H) 70 - 99 mg/dL    Comment: Glucose reference range applies only to samples taken after fasting for at least 8 hours.  Glucose, capillary     Status: Abnormal   Collection Time: 11/28/22 10:59 PM  Result Value Ref Range   Glucose-Capillary 200 (H) 70 - 99 mg/dL    Comment: Glucose reference range applies only to samples taken after fasting for at least 8 hours.  I-STAT 7, (LYTES, BLD GAS, ICA, H+H)     Status:  Abnormal   Collection Time: 11/28/22 11:27 PM  Result Value Ref Range   pH, Arterial 7.287 (L) 7.35 - 7.45   pCO2 arterial 60.1 (H) 32 - 48 mmHg   pO2, Arterial 162 (H) 83 - 108 mmHg  Bicarbonate 28.8 (H) 20.0 - 28.0 mmol/L   TCO2 31 22 - 32 mmol/L   O2 Saturation 99 %   Acid-Base Excess 1.0 0.0 - 2.0 mmol/L   Sodium 151 (H) 135 - 145 mmol/L   Potassium 3.6 3.5 - 5.1 mmol/L   Calcium, Ion 1.32 1.15 - 1.40 mmol/L   HCT 30.0 (L) 36.0 - 46.0 %   Hemoglobin 10.2 (L) 12.0 - 15.0 g/dL   Patient temperature 36.7 C    Collection site art line    Drawn by RT    Sample type ARTERIAL   Glucose, capillary     Status: Abnormal   Collection Time: 11/28/22 11:57 PM  Result Value Ref Range   Glucose-Capillary 196 (H) 70 - 99 mg/dL    Comment: Glucose reference range applies only to samples taken after fasting for at least 8 hours.  Basic metabolic panel     Status: Abnormal   Collection Time: 11/28/22 11:59 PM  Result Value Ref Range   Sodium 147 (H) 135 - 145 mmol/L   Potassium 3.5 3.5 - 5.1 mmol/L   Chloride 114 (H) 98 - 111 mmol/L   CO2 28 22 - 32 mmol/L   Glucose, Bld 193 (H) 70 - 99 mg/dL    Comment: Glucose reference range applies only to samples taken after fasting for at least 8 hours.   BUN 25 (H) 6 - 20 mg/dL   Creatinine, Ser 1.01 (H) 0.44 - 1.00 mg/dL   Calcium 7.9 (L) 8.9 - 10.3 mg/dL   GFR, Estimated >60 >60 mL/min    Comment: (NOTE) Calculated using the CKD-EPI Creatinine Equation (2021)    Anion gap 5 5 - 15    Comment: Performed at [facility] [address], [place], [place] [address]  Beta-hydroxybutyric acid     Status: None   Collection Time: 11/28/22 11:59 PM  Result Value Ref Range   Beta-Hydroxybutyric Acid 0.05 0.05 - 0.27 mmol/L    Comment: Performed at [facility] [address], [place], [place] [address]  Glucose, capillary     Status: Abnormal   Collection Time: 11/29/22 12:59 AM  Result Value Ref Range   Glucose-Capillary 175 (H) 70 -  99 mg/dL    Comment: Glucose reference range applies only to samples taken after fasting for at least 8 hours.  Glucose, capillary     Status: Abnormal   Collection Time: 11/29/22  2:01 AM  Result Value Ref Range   Glucose-Capillary 165 (H) 70 - 99 mg/dL    Comment: Glucose reference range applies only to samples taken after fasting for at least 8 hours.  Glucose, capillary     Status: Abnormal   Collection Time: 11/29/22  3:10 AM  Result Value Ref Range   Glucose-Capillary 137 (H) 70 - 99 mg/dL    Comment: Glucose reference range applies only to samples taken after fasting for at least 8 hours.  Glucose, capillary     Status: Abnormal   Collection Time: 11/29/22  3:49 AM  Result Value Ref Range   Glucose-Capillary 118 (H) 70 - 99 mg/dL    Comment: Glucose reference range applies only to samples taken after fasting for at least 8 hours.  Glucose, capillary     Status: Abnormal   Collection Time: 11/29/22  5:15 AM  Result Value Ref Range   Glucose-Capillary 127 (H) 70 - 99 mg/dL    Comment: Glucose reference range applies only to samples taken after fasting for  at least 8 hours.    DG Chest Port 1 View  Result Date: 11/28/2022 CLINICAL DATA:  ARDS EXAM: PORTABLE CHEST 1 VIEW COMPARISON:  Chest x-ray 11/28/2022 FINDINGS: Endotracheal tube tip is 3.7 cm above the carina. Enteric tube extends below the diaphragm. Central venous catheter tip projects over the right atrium. The cardiomediastinal silhouette is within normal limits. Diffuse interstitial and airspace opacities, central predominance, are again seen. This is not significantly changed. There is no pleural effusion or pneumothorax. No acute fractures are seen. IMPRESSION: 1. Unchanged diffuse interstitial and airspace opacities, central predominance, consistent with ARDS. 2. Lines and tubes as above. Electronically Signed   By: Ronney Asters M.D.   On: 11/28/2022 20:10   DG Abd Portable 1V  Result Date: 11/28/2022 CLINICAL DATA:   Feeding tube placement EXAM: PORTABLE ABDOMEN - 1 VIEW limited for tube placement COMPARISON:  Ultrasound 11/28/2022 earlier FINDINGS: Limited x-ray for tube placement of the upper abdomen demonstrates feeding tube with tip at the level of the pylorus just to the right of the spine. There is air in the stomach. Otherwise gasless appearance of the upper abdomen. Interstitial changes of the lungs at the edge of the imaging field. Dedicated chest x-ray when clinically appropriate IMPRESSION: 1. Feeding tube tip at the level of the pylorus. 2. Relatively gasless appearance of the upper abdomen except for air in the stomach. Electronically Signed   By: Jill Side M.D.   On: 11/28/2022 16:20   ECHOCARDIOGRAM COMPLETE  Result Date: 11/28/2022    ECHOCARDIOGRAM REPORT   Patient Name:   BERNIDA MCCART Date of Exam: 11/28/2022 Medical Rec #:  [id number]         Height:       60.0 in Accession #:    [id number]        Weight:       86.0 lb Date of Birth:  02-06-1986         BSA:          1.304 m Patient Age:    28 years          BP:           89/63 mmHg Patient Gender: F                 HR:           111 bpm. Exam Location:  Inpatient Procedure: 2D Echo, Cardiac Doppler and Color Doppler Indications:    Pulmonary Hypertension I27.2  History:        Patient has prior history of Echocardiogram examinations, most                 recent 10/31/2021. CHF; Risk Factors:Hypertension, Diabetes and                 Current Smoker.  Sonographer:    Ronny Flurry Referring Phys: [id number] Tiffney Haughton IMPRESSIONS  1. Left ventricular ejection fraction, by estimation, is >75%. The left ventricle has hyperdynamic function. The left ventricle has no regional wall motion abnormalities. There is moderate left ventricular hypertrophy. Left ventricular diastolic parameters are indeterminate.  2. Right ventricular systolic function is normal. The right ventricular size is normal. There is normal pulmonary artery systolic pressure. The  estimated right ventricular systolic pressure is 123XX123 mmHg.  3. The mitral valve is grossly normal. Trivial mitral valve regurgitation. No evidence of mitral stenosis.  4. The aortic valve is grossly normal. Aortic valve regurgitation is not visualized. No aortic stenosis is  present.  5. The inferior vena cava is normal in size with greater than 50% respiratory variability, suggesting right atrial pressure of 3 mmHg. Comparison(s): No significant change from prior study. FINDINGS  Left Ventricle: Left ventricular ejection fraction, by estimation, is >75%. The left ventricle has hyperdynamic function. The left ventricle has no regional wall motion abnormalities. The left ventricular internal cavity size was normal in size. There is moderate left ventricular hypertrophy. Left ventricular diastolic parameters are indeterminate. Right Ventricle: The right ventricular size is normal. Right vetricular wall thickness was not well visualized. Right ventricular systolic function is normal. There is normal pulmonary artery systolic pressure. The tricuspid regurgitant velocity is 2.61 m/s, and with an assumed right atrial pressure of 3 mmHg, the estimated right ventricular systolic pressure is 123XX123 mmHg. Left Atrium: Left atrial size was normal in size. Right Atrium: Right atrial size was normal in size. Pericardium: There is no evidence of pericardial effusion. Mitral Valve: The mitral valve is grossly normal. Trivial mitral valve regurgitation. No evidence of mitral valve stenosis. Tricuspid Valve: The tricuspid valve is grossly normal. Tricuspid valve regurgitation is trivial. No evidence of tricuspid stenosis. Aortic Valve: The aortic valve is grossly normal. Aortic valve regurgitation is not visualized. No aortic stenosis is present. Aortic valve mean gradient measures 4.0 mmHg. Aortic valve peak gradient measures 7.5 mmHg. Aortic valve area, by VTI measures 1.94 cm. Pulmonic Valve: The pulmonic valve was grossly normal.  Pulmonic valve regurgitation is not visualized. No evidence of pulmonic stenosis. Aorta: The aortic root, ascending aorta, aortic arch and descending aorta are all structurally normal, with no evidence of dilitation or obstruction. Venous: The inferior vena cava is normal in size with greater than 50% respiratory variability, suggesting right atrial pressure of 3 mmHg. IAS/Shunts: The interatrial septum was not well visualized.  LEFT VENTRICLE PLAX 2D LVIDd:         2.30 cm   Diastology LVIDs:         1.60 cm   LV e' medial:    5.84 cm/s LV PW:         0.80 cm   LV E/e' medial:  15.4 LV IVS:        1.40 cm   LV e' lateral:   7.62 cm/s LVOT diam:     1.70 cm   LV E/e' lateral: 11.8 LV SV:         32 LV SV Index:   25 LVOT Area:     2.27 cm  RIGHT VENTRICLE         IVC TAPSE (M-mode): 1.1 cm  IVC diam: 1.60 cm LEFT ATRIUM           Index       RIGHT ATRIUM          Index LA diam:      1.60 cm 1.23 cm/m  RA Area:     3.02 cm LA Vol (A4C): 4.9 ml  3.74 ml/m  RA Volume:   4.12 ml  3.16 ml/m  AORTIC VALVE AV Area (Vmax):    2.04 cm AV Area (Vmean):   1.97 cm AV Area (VTI):     1.94 cm AV Vmax:           137.00 cm/s AV Vmean:          90.800 cm/s AV VTI:            0.167 m AV Peak Grad:      7.5 mmHg AV Mean Grad:  4.0 mmHg LVOT Vmax:         123.00 cm/s LVOT Vmean:        78.733 cm/s LVOT VTI:          0.143 m LVOT/AV VTI ratio: 0.86  AORTA Ao Root diam: 2.60 cm MITRAL VALVE               TRICUSPID VALVE MV Area (PHT): 4.17 cm    TR Peak grad:   27.2 mmHg MV Decel Time: 182 msec    TR Vmax:        261.00 cm/s MV E velocity: 89.70 cm/s MV A velocity: 89.70 cm/s  SHUNTS MV E/A ratio:  1.00        Systemic VTI:  0.14 m                            Systemic Diam: 1.70 cm Buford Dresser MD Electronically signed by Buford Dresser MD Signature Date/Time: 11/28/2022/3:51:44 PM    Final    DG CHEST PORT 1 VIEW  Result Date: 11/28/2022 CLINICAL DATA:  Central line placement. EXAM: PORTABLE CHEST 1 VIEW  COMPARISON:  November 27, 2022. FINDINGS: The heart size and mediastinal contours are within normal limits. Endotracheal tube is in grossly good position. Interval placement of left internal jugular catheter with distal tip in expected position of right atrium. Significantly increased bilateral reticular opacities are noted throughout both lungs most consistent with worsening atypical pneumonia. The visualized skeletal structures are unremarkable. IMPRESSION: Endotracheal tube in grossly good position. Left internal jugular catheter is noted with distal tip in expected position of right atrium; withdrawal by 3-4 cm is recommended. Increased bilateral lung opacities as noted above. Electronically Signed   By: Marijo Conception M.D.   On: 11/28/2022 12:14   US Abdomen Complete  Result Date: 11/28/2022 CLINICAL DATA:  [id number].  Hepatitis. EXAM: ABDOMEN ULTRASOUND COMPLETE COMPARISON:  CT with IV contrast 05/27/2022 FINDINGS: Gallbladder: There is intraluminal sludge. There is pericholecystic fluid. No gallstones are seen. There is no positive sonographic Murphy's sign. The free wall is thickened up to 3.7 mm. Common bile duct: Diameter: 4.8 mm with no intrahepatic biliary prominence. Liver: No focal lesion identified. There is increased hepatic echogenicity consistent with steatosis. Portal vein is patent on color Doppler imaging with normal direction of blood flow towards the liver. IVC: No abnormality visualized. Pancreas: Visualized portion unremarkable. Spleen: Size and appearance within normal limits.  8.8 cm in length. Right Kidney: Length: 10.7 cm. Echogenicity within normal limits. No mass or hydronephrosis visualized. Left Kidney: Length: 11.8 cm. Echogenicity within normal limits. No mass or hydronephrosis visualized. Abdominal aorta: No aneurysm visualized. Other findings: None. Technically difficult due to: Patient agitation. IMPRESSION: 1. Gallbladder sludge with pericholecystic fluid and gallbladder wall  thickening. No gallstones are seen. There is no positive sonographic Murphy's sign. 2. The wall thickening could be reactive due to the patient's hepatitis, could be reactive due to the presence of ascites, could relate to hepatic dysfunction, or could be due to acalculous cholecystitis. Clinical correlation advised. 3. Hepatic steatosis. Electronically Signed   By: Telford Nab M.D.   On: 11/28/2022 05:26   DG Chest Port 1 View  Result Date: 11/27/2022 CLINICAL DATA:  ams EXAM: PORTABLE CHEST 1 VIEW COMPARISON:  Chest x-ray 11/12/2022 FINDINGS: The heart and mediastinal contours are unchanged. Interval development of bilateral, left greater than right, airspace and interstitial opacities. No pleural effusion. No pneumothorax. No acute  osseous abnormality. IMPRESSION: Interval development of bilateral, left greater than right, airspace and interstitial opacities. Findings consistent with infection/inflammation. Followup PA and lateral chest X-ray is recommended in 3-4 weeks following therapy to ensure resolution. Electronically Signed   By: Iven Finn M.D.   On: 11/27/2022 23:11     Blood pressure 111/71, pulse 96, temperature 98.2 F (36.8 C), resp. rate (!) 35, height 5' (1.524 m), weight 39 kg, SpO2 100 %. Physical Exam Constitutional:      Appearance: She is cachectic. She is toxic-appearing.     Interventions: She is sedated, chemically paralyzed and intubated.     Comments:    HENT:     Head:     Comments: Orally intubated Pulmonary:     Effort: She is intubated.     Comments: Crackles and bronchial breath sounds to midaxillary line bilaterally Abdominal:     General: Abdomen is flat.  Genitourinary:    Comments: Foley place.  Psychiatric:        Behavior: Behavior is uncooperative.    Assessment/Plan:  Acute hypoxic hypercarbic respiratory failure requiring mechanical ventilation.  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 High vasopressor requirements are also a relative contraindication.  - Recommend pursuing standard rescue strategies such as NMB and prone ventilation.  - Prognosis likely poor.    Kipp Brood Date: 11/29/2022 Time: 7:24 AM

## 2022-11-29 NOTE — Progress Notes (Signed)
Nutrition Follow-up  DOCUMENTATION CODES:   Underweight, Severe malnutrition in context of chronic illness  INTERVENTION:   - Plan for Cortrak team to attempt to advance Cortrak post-pyloric tomorrow, 11/30/22  Initiate trickle tube feeds via Cortrak (at pylorus): - Vital 1.5 @ 20 ml/hr (480 ml/day)  Monitor magnesium, potassium, and phosphorus q 12 hours for at least 6 occurrences, MD to replete as needed, as pt is at risk for refeeding syndrome given severe malnutrition.  - Continue IV thiamine 100 mg daily   RD will monitor for ability to advance tube feeding regimen to goal: - Vital 1.5 @ 45 ml/hr (1080 ml/day)  Recommended tube feeding regimen at goal rate would provide 1620 kcal, 73 grams of protein, and 825 ml of H2O.   NUTRITION DIAGNOSIS:   Severe Malnutrition related to chronic illness (uncontrolled T1DM (hemoglobin A1C >15.5), HFrEF) as evidenced by severe fat depletion, severe muscle depletion, percent weight loss (18.9% weight loss in < 2 months).  Ongoing, being addressed via initiation of trickle TF  GOAL:   Patient will meet greater than or equal to 90% of their needs  Unmet at this time  MONITOR:   Vent status, Labs, Weight trends, TF tolerance  REASON FOR ASSESSMENT:   Consult Enteral/tube feeding initiation and management (trickle TF)  ASSESSMENT:   37 year old female who presented to the ED on 2/11 with hyperglycemia and AMS. PMH of T1DM, cocaine abuse, HFrEF, HTN, anxiety, depression. Pt admitted with DKA.  02/12 - intubated, Cortrak placement (tip at the pylorus), pt proned 02/13 - pt vomited, OG tube placed to LIWS, pt unproned   Discussed pt with RN and during ICU rounds. Pt with ARDS, AKI. Per notes, pt is not an ECMO candidate. Pt vomited this AM and OG tube placed and hooked to LIWS with 100 ml output so far per RN. PCCM adding bowel regimen and reglan.  Pt with moderate pitting facial and perineal edema.  Patient is currently intubated  on ventilator support MV: 12.3 L/min Temp (24hrs), Avg:98.4 F (36.9 C), Min:96.6 F (35.9 C), Max:101.3 F (38.5 C) BP (a-line): 138/73 MAP (a-line): 91  Drips: Propofol: 50 mcg/kg/hr (provides 309 kcal daily from lipid) Dilaudid Levophed: 15 mcg/min Vasopressin: 0.04 units/min D10: 30 ml/hr  Medications reviewed and include: IV folic acid, SSI q 4 hours, semglee 20 units daily, IV reglan 5 mg q 8 hours, miralax, senna, IV thiamine 100 mg daily, IV abx  Labs reviewed: sodium 146, BUN 24, creatinine 1.01, elevated LFTs, WBC 25.0, platelets 62 CBG's: 84-154 x 24 hours  UOP: 1425 ml x 24 hours I/O's: +7.3 L since admit  Diet Order:   Diet Order             Diet NPO time specified  Diet effective now                   EDUCATION NEEDS:   Not appropriate for education at this time  Skin:  Skin Assessment: Skin Integrity Issues: Other: fissures to bilateral heels, fissures to bilateral hands  Last BM:  11/29/22 smear type 5  Height:   Ht Readings from Last 1 Encounters:  11/27/22 5' (1.524 m)    Weight:   Wt Readings from Last 1 Encounters:  11/27/22 39 kg    Ideal Body Weight:  45.5 kg  BMI:  Body mass index is 16.79 kg/m.  Estimated Nutritional Needs:   Kcal:  1500-1700  Protein:  65-60 grams  Fluid:  1.5-1.7 L  Gustavus Bryant, MS, RD, LDN Inpatient Clinical Dietitian Please see AMiON for contact information.

## 2022-11-29 NOTE — Progress Notes (Signed)
Patient vomiting while RN in room. Mouth suctioned. MD notified. Patient unproned with respiratory and nursing staff. OG placed and set to low intermittent suction. Oral care performed.

## 2022-11-29 NOTE — Progress Notes (Signed)
Pt's head turned to the left at this time. ETT secured.  Pt tolerated well.

## 2022-11-30 ENCOUNTER — Inpatient Hospital Stay (HOSPITAL_COMMUNITY): Payer: Medicaid Other

## 2022-11-30 DIAGNOSIS — J189 Pneumonia, unspecified organism: Secondary | ICD-10-CM | POA: Diagnosis not present

## 2022-11-30 LAB — CBC
HCT: 27.4 % — ABNORMAL LOW (ref 36.0–46.0)
HCT: 22 % — ABNORMAL LOW (ref 36.0–46.0)
Hemoglobin: 9.2 g/dL — ABNORMAL LOW (ref 12.0–15.0)
Hemoglobin: 7.7 g/dL — ABNORMAL LOW (ref 12.0–15.0)
MCH: 28 pg (ref 26.0–34.0)
MCH: 28.8 pg (ref 26.0–34.0)
MCHC: 33.6 g/dL (ref 30.0–36.0)
MCHC: 35 g/dL (ref 30.0–36.0)
MCV: 83.5 fL (ref 80.0–100.0)
MCV: 82.4 fL (ref 80.0–100.0)
Platelets: 32 10*3/uL — ABNORMAL LOW (ref 150–400)
Platelets: 22 10*3/uL — CL (ref 150–400)
RBC: 3.28 MIL/uL — ABNORMAL LOW (ref 3.87–5.11)
RBC: 2.67 MIL/uL — ABNORMAL LOW (ref 3.87–5.11)
RDW: 17.9 % — ABNORMAL HIGH (ref 11.5–15.5)
RDW: 17.5 % — ABNORMAL HIGH (ref 11.5–15.5)
WBC: 26.1 10*3/uL — ABNORMAL HIGH (ref 4.0–10.5)
WBC: 18 10*3/uL — ABNORMAL HIGH (ref 4.0–10.5)
nRBC: 0 % (ref 0.0–0.2)
nRBC: 0 % (ref 0.0–0.2)

## 2022-11-30 LAB — POCT I-STAT 7, (LYTES, BLD GAS, ICA,H+H)
Acid-base deficit: 1 mmol/L (ref 0.0–2.0)
Bicarbonate: 24.3 mmol/L (ref 20.0–28.0)
Calcium, Ion: 1.16 mmol/L (ref 1.15–1.40)
HCT: 29 % — ABNORMAL LOW (ref 36.0–46.0)
Hemoglobin: 9.9 g/dL — ABNORMAL LOW (ref 12.0–15.0)
O2 Saturation: 100 %
Patient temperature: 37.7
Potassium: 3.9 mmol/L (ref 3.5–5.1)
Sodium: 145 mmol/L (ref 135–145)
TCO2: 26 mmol/L (ref 22–32)
pCO2 arterial: 42.8 mmHg (ref 32–48)
pH, Arterial: 7.365 (ref 7.35–7.45)
pO2, Arterial: 245 mmHg — ABNORMAL HIGH (ref 83–108)

## 2022-11-30 LAB — MAGNESIUM
Magnesium: 1.8 mg/dL (ref 1.7–2.4)
Magnesium: 2.2 mg/dL (ref 1.7–2.4)

## 2022-11-30 LAB — COMPREHENSIVE METABOLIC PANEL
ALT: 533 U/L — ABNORMAL HIGH (ref 0–44)
AST: 46 U/L — ABNORMAL HIGH (ref 15–41)
Albumin: <1.5 g/dL — ABNORMAL LOW (ref 3.5–5.0)
Alkaline Phosphatase: 199 U/L — ABNORMAL HIGH (ref 38–126)
Anion gap: 9 (ref 5–15)
BUN: 27 mg/dL — ABNORMAL HIGH (ref 6–20)
CO2: 25 mmol/L (ref 22–32)
Calcium: 7.3 mg/dL — ABNORMAL LOW (ref 8.9–10.3)
Chloride: 108 mmol/L (ref 98–111)
Creatinine, Ser: 1.01 mg/dL — ABNORMAL HIGH (ref 0.44–1.00)
GFR, Estimated: >60 mL/min (ref >60)
Glucose, Bld: 110 mg/dL — ABNORMAL HIGH (ref 70–99)
Potassium: 3.9 mmol/L (ref 3.5–5.1)
Sodium: 142 mmol/L (ref 135–145)
Total Bilirubin: 0.9 mg/dL (ref 0.3–1.2)
Total Protein: 4.6 g/dL — ABNORMAL LOW (ref 6.5–8.1)

## 2022-11-30 LAB — TYPE AND SCREEN
ABO/RH(D): AB POS
Antibody Screen: NEGATIVE

## 2022-11-30 LAB — DIC (DISSEMINATED INTRAVASCULAR COAGULATION)PANEL
D-Dimer, Quant: 0.52 ug/mL-FEU — ABNORMAL HIGH (ref 0.00–0.50)
Fibrinogen: 498 mg/dL — ABNORMAL HIGH (ref 210–475)
INR: 1.2 (ref 0.8–1.2)
Platelets: 24 10*3/uL — CL (ref 150–400)
Prothrombin Time: 15 seconds (ref 11.4–15.2)
Smear Review: NONE SEEN
aPTT: 34 seconds (ref 24–36)

## 2022-11-30 LAB — PHOSPHORUS
Phosphorus: 2.6 mg/dL (ref 2.5–4.6)
Phosphorus: 1.6 mg/dL — ABNORMAL LOW (ref 2.5–4.6)

## 2022-11-30 LAB — GLUCOSE, CAPILLARY
Glucose-Capillary: 101 mg/dL — ABNORMAL HIGH (ref 70–99)
Glucose-Capillary: 105 mg/dL — ABNORMAL HIGH (ref 70–99)
Glucose-Capillary: 115 mg/dL — ABNORMAL HIGH (ref 70–99)
Glucose-Capillary: 118 mg/dL — ABNORMAL HIGH (ref 70–99)
Glucose-Capillary: 133 mg/dL — ABNORMAL HIGH (ref 70–99)
Glucose-Capillary: 158 mg/dL — ABNORMAL HIGH (ref 70–99)
Glucose-Capillary: 44 mg/dL — CL (ref 70–99)
Glucose-Capillary: 77 mg/dL (ref 70–99)

## 2022-11-30 MED ORDER — MAGNESIUM SULFATE 2 GM/50ML IV SOLN
2.0000 g | Freq: Once | INTRAVENOUS | Status: AC
  Administered 2022-11-30: 2 g via INTRAVENOUS
  Filled 2022-11-30: qty 50

## 2022-11-30 MED ORDER — MIDODRINE HCL 5 MG PO TABS
10.0000 mg | ORAL_TABLET | Freq: Three times a day (TID) | ORAL | Status: DC
  Administered 2022-11-30 – 2022-12-01 (×3): 10 mg
  Filled 2022-11-30 (×3): qty 2

## 2022-11-30 MED ORDER — POTASSIUM PHOSPHATES 15 MMOLE/5ML IV SOLN
30.0000 mmol | Freq: Once | INTRAVENOUS | Status: AC
  Administered 2022-11-30: 30 mmol via INTRAVENOUS
  Filled 2022-11-30: qty 10

## 2022-11-30 MED ORDER — INSULIN DETEMIR 100 UNIT/ML ~~LOC~~ SOLN
5.0000 [IU] | Freq: Two times a day (BID) | SUBCUTANEOUS | Status: DC
  Administered 2022-12-01 (×2): 5 [IU] via SUBCUTANEOUS
  Filled 2022-11-30 (×4): qty 0.05

## 2022-11-30 MED ORDER — ACETAMINOPHEN 325 MG PO TABS
650.0000 mg | ORAL_TABLET | Freq: Four times a day (QID) | ORAL | Status: DC | PRN
  Administered 2022-12-04: 650 mg
  Filled 2022-11-30 (×2): qty 2

## 2022-11-30 MED ORDER — CEFAZOLIN SODIUM-DEXTROSE 2-4 GM/100ML-% IV SOLN
2.0000 g | Freq: Three times a day (TID) | INTRAVENOUS | Status: AC
  Administered 2022-11-30 – 2022-12-07 (×21): 2 g via INTRAVENOUS
  Filled 2022-11-30 (×23): qty 100

## 2022-11-30 MED ORDER — ALBUMIN HUMAN 25 % IV SOLN
25.0000 g | Freq: Once | INTRAVENOUS | Status: AC
  Administered 2022-11-30: 25 g via INTRAVENOUS
  Filled 2022-11-30: qty 100

## 2022-11-30 MED ORDER — POTASSIUM CHLORIDE 10 MEQ/50ML IV SOLN
10.0000 meq | INTRAVENOUS | Status: AC
  Administered 2022-11-30 (×2): 10 meq via INTRAVENOUS
  Filled 2022-11-30 (×2): qty 50

## 2022-11-30 MED ORDER — FUROSEMIDE 10 MG/ML IJ SOLN
20.0000 mg | Freq: Once | INTRAMUSCULAR | Status: AC
  Administered 2022-11-30: 20 mg via INTRAVENOUS
  Filled 2022-11-30: qty 2

## 2022-11-30 NOTE — Progress Notes (Signed)
NAME:  Anum Carli, MRN:  [id number], DOB:  08/21/1986, LOS: 2 ADMISSION DATE:  11/27/2022, CONSULTATION DATE:  11/28/22 REFERRING MD:  Marlon Pel , CHIEF COMPLAINT:  ams   History of Present Illness:  36 yo woman with hx of DM1, HFrEF, HTN, anxiety/depression, cocaine use, DKA, presenting with DKA and ARDS.  Pertinent  Medical History  Home lantus 73m daily  Mod sliding scale insulin  Augmentin for pna (on d/c 2/1 ) x 3 days   Significant Hospital Events: Including procedures, antibiotic start and stop dates in addition to other pertinent events   2/12 admit, intubation, proning 2/13 vomited proned, supinated, another emesis episode later, PF ratio improving  Interim History / Subjective:  No further vomiting episodes overnight FBG hypoglycemic this am, checked serum 105 Plt down to 32 NE requirements down after vaso added, remains on vaso/ NE 3 CVP 8  Objective   Blood pressure 91/76, pulse 68, temperature 99.9 F (37.7 C), resp. rate (!) 35, height 5' (1.524 m), weight 42.5 kg, SpO2 100 %.    Vent Mode: PRVC FiO2 (%):  [50 %-100 %] 50 % Set Rate:  [35 bmp] 35 bmp Vt Set:  [360 mL] 360 mL PEEP:  [14 cmH20] 14 cmH20 Plateau Pressure:  [25 [id number]] 25 cmH20   Intake/Output Summary (Last 24 hours) at 11/30/2022 [id number] data filed at 11/30/2022 0800 Gross per 24 hour  Intake 2257.17 ml  Output 815 ml  Net 1442.17 ml   Filed Weights   11/27/22 2249 11/30/22 0500  Weight: 39 kg 42.5 kg    Examination: Propofol 50, dilaudid 237mGeneral:  AoC ill appearing adult female lying in bed in NAD/ sedated  HEENT: MM pale/moist, pupils minimally reactive 4/ anicteric Neuro:  RASS -5, sedated  CV: rr, NSR, no murmur PULM:  MV supported, clear, no secretions GI: soft, bs+, ND, foley Extremities: warm/dry, generalized anasarca  Skin: see pics.  Old skin growth and old fissures to hands, same in feet- fissures to heels/ balls of feet> no erythema, no  drainage         No NGT output 12hrs  UOP 790 ml/24hrs +1.4L /24hrs Net +8.4L   Labs> K 3.9, BUN/ sCr 24/ 1> 27/ 1.01, phos 2.6, Mag 1.8, Na 142, improving LFTs, iCa 1.16, WBC 25>  26, H/H  10.1/ 29> 9.2/ 27, plts 152>  62> 32  2/12 BAL> MSSA   Resolved Hospital Problem list     Assessment & Plan:   Shock, septic +/- sedation related, ?vasoplegia due to malnutrition  - cont to wean NE/ vaso for MAP goal > 65 - add midodrine  - trend CVP  - follow cultures> BAL with MSSA> narrow to ancef - trend WBC/ fever curve   Acute hypoxemic and hypercarbic resp failure ARDS MSSA PNA - PF ratio 490.  Start to minimize PEEP/ wean FiO2 for sat goal > 92% - cont MV support, 4-8cc/kg IBW with goal Pplat <30 and DP<15  - leave RR/ TV for now given ABG 7.36/ 42 -VAP prevention protocol/ PPI -PAD protocol for sedation> minimize propofol/ dilaudid, consider changing to precedex.  Hold on enteral sedation for now - intermittent CXR/ ABG - diurese  Thrombocytopenia  - given recent hospitalization and exposure to ppx heparin SQ, will send for HIT ab.  - stop heparin ppx  - stat DIC panel, send T&S - recheck CBC this evening - could be related to sepsis/ Transaminitis   Acute kidney injury Anasarca  - sCr  likely does not accurately reflect AKI given poor muscle mass  - albumin/ lasix x 1 - Trend BMP / urinary output - Replace electrolytes as indicated - Avoid nephrotoxic agents, ensure adequate renal perfusion  Acute metabolic encephalopathy- related to ARDS, DKA, sepsis  - start to minimize sedation/ wake up - supportive care as above   Severe protein calorie malnutrition Likely component of gastroparesis 2/2 poorly controlled DM High risk for refeeding syndrome  - cortrak to be advance to post-pyloric> TF per RD, trickle for now, monitor electrolytes closely   DKA- resolved - cont D10 gtt, wean as trickle TF started  - CBG q 4 - eventually DM coordinator education    Transaminitis - likely related to shock  - LFT improving  - hepatitis panel neg 2/13  Best Practice (right click and "Reselect all SmartList Selections" daily)   Diet/type: NPO; starting trickle TF DVT prophylaxis: SCD GI prophylaxis: PPI Lines: Central line and Arterial Line Foley:  Yes, and it is still needed Code Status:  full code Last date of multidisciplinary goals of care discussion [updated daughter and sister at bedside 2/12, still haven't talked to husband but they said they would relay message]  Pending update 2/14, no family at bedside  CCT 38 mins   Kennieth Rad, [facility] 11/30/2022, 9:06 AM  See Amion for pager If no response to pager, please call PCCM consult pager After 7:00 pm call Elink

## 2022-11-30 NOTE — Inpatient Diabetes Management (Signed)
Inpatient Diabetes Program Recommendations  AACE/ADA: New Consensus Statement on Inpatient Glycemic Control (  Target Ranges:  Prepandial:   less than 140 mg/dL      Peak postprandial:   less than 180 mg/dL (1-2 hours)      Critically ill patients:  140 - 180 mg/dL    Latest Reference Range & Units 11/30/22 03:22 11/30/22 07:59 11/30/22 08:25 11/30/22 11:31  Glucose-Capillary 70 - 99 mg/dL 115 (H) 44 (LL) 105 (H) 77    Latest Reference Range & Units 11/29/22 07:42 11/29/22 11:23 11/29/22 11:49 11/29/22 15:02 11/29/22 20:02 11/29/22 23:54  Glucose-Capillary 70 - 99 mg/dL 84 154 (H) 139 (H) 132 (H) 155 (H) 133 (H)   Review of Glycemic Control  Diabetes history: DM1 (does NOT make any insulin; requires basal, correction, and carb coverage insulin) Outpatient Diabetes medications: Lantus 35 units daily, Novolog 1 unit per 15 grams of carbs, Novolog 1 unit per 50 mg/dl above target of 140 mg/dl Current orders for Inpatient glycemic control: Semglee 20 units Q24H, Novolog 0-9 units Q4H, Vital @ 20 ml/hr  Inpatient Diabetes Program Recommendations:    Insulin: Please consider decreasing Semglee to 17 units Q24H.   Thanks, Barnie Alderman, RN, MSN, [facility] Coordinator Inpatient Diabetes Program [phone] (Team Pager from 8am to [place])

## 2022-11-30 NOTE — Progress Notes (Signed)
[facility] Progress Note Patient Name: Lizbette Whitcomb DOB: 01/18/1986 MRN: [id number]   Date of Service  11/30/2022  HPI/Events of Note  Patient with frequent loose stools, platelet count 22 K.  eICU Interventions  Rectal pouch (as opposed to rectal tube) ordered PRN.        Kerry Kass Lelend Heinecke 11/30/2022, 11:03 PM

## 2022-11-30 NOTE — Progress Notes (Signed)
Pharmacy Electrolyte Replacement  Recent Labs:  Recent Labs    11/30/22 0517 11/30/22 0922 11/30/22 1723  K 3.9 3.9  --   MG 1.8  --  2.2  PHOS 2.6  --  1.6*  CREATININE 1.01*  --   --     Low Critical Values (K </= 2.5, Phos </= 1, Mg </= 1) Present: None  Plan: Potassium phosphate 73mol x 1. F/u labs in a.m.  CSherlon Handing PharmD, BCPS Please see amion for complete clinical pharmacist phone list 11/30/2022 8:02 PM

## 2022-11-30 NOTE — Progress Notes (Signed)
Brief Nutrition Support Note  Pt last seen by RD for follow-up assessment yesterday, 11/29/22. Trickle tube feeds were ordered at that time but unable to be started due to pt vomiting after medications being administered via Cortrak.  Cortrak team able to advance Cortrak tube post-pyloric today with feeding tube tip in proximal jejunum beyond the LOT per abdominal x-ray. X-ray also mentions no dilated bowel loops.  Discussed with PCCM MD. Faythe Ghee to start trickle tube feeds via Cortrak. Discussed with RN. Orders already in place from yesterday.  Initiate trickle tube feeds via post-pyloric Cortrak: - Vital 1.5 @ 20 ml/hr (480 ml/day)  RD will continue to follow pt and monitor for ability to advance tube feeding regimen to goal: - Vital 1.5 @ 45 ml/hr (1080 ml/day)   Recommended tube feeding regimen at goal rate would provide 1620 kcal, 73 grams of protein, and 825 ml of H2O.   Pt remains at refeeding risk and is likely currently refeeding given severe hypophosphatemia on D10 yesterday as well as low normal magnesium this AM. Electrolytes are being closely monitored. Supplementation has been ordered.   Gustavus Bryant, MS, RD, LDN Inpatient Clinical Dietitian Please see AMiON for contact information.

## 2022-11-30 NOTE — Procedures (Signed)
Cortrak  Tube Type:  Cortrak - 43 inches Tube Location:  Right nare Secured by: Bridle Technique Used to Measure Tube Placement:  Marking at nare/corner of mouth Cortrak Secured At:  105 cm   Cortrak Tube Team Note:  Consult received to place a Cortrak feeding tube.   X-ray is required, abdominal x-ray has been ordered by the Cortrak team. Please confirm tube placement before using the Cortrak tube.   If the tube becomes dislodged please keep the tube and contact the Cortrak team at [email or web address] for replacement.  If after hours and replacement cannot be delayed, place a NG tube and confirm placement with an abdominal x-ray.    Koleen Distance MS, RD, LDN Please refer to [facility] for RD and/or RD on-call/weekend/after hours pager

## 2022-12-01 DIAGNOSIS — J189 Pneumonia, unspecified organism: Secondary | ICD-10-CM | POA: Diagnosis not present

## 2022-12-01 LAB — GLUCOSE, CAPILLARY
Glucose-Capillary: 210 mg/dL — ABNORMAL HIGH (ref 70–99)
Glucose-Capillary: 156 mg/dL — ABNORMAL HIGH (ref 70–99)
Glucose-Capillary: 74 mg/dL (ref 70–99)
Glucose-Capillary: 49 mg/dL — ABNORMAL LOW (ref 70–99)
Glucose-Capillary: 151 mg/dL — ABNORMAL HIGH (ref 70–99)
Glucose-Capillary: 66 mg/dL — ABNORMAL LOW (ref 70–99)
Glucose-Capillary: 136 mg/dL — ABNORMAL HIGH (ref 70–99)
Glucose-Capillary: 99 mg/dL (ref 70–99)

## 2022-12-01 LAB — CULTURE, RESPIRATORY W GRAM STAIN: Gram Stain: NONE SEEN

## 2022-12-01 LAB — POCT I-STAT 7, (LYTES, BLD GAS, ICA,H+H)
Acid-Base Excess: 2 mmol/L (ref 0.0–2.0)
Bicarbonate: 27.5 mmol/L (ref 20.0–28.0)
Calcium, Ion: 1.12 mmol/L — ABNORMAL LOW (ref 1.15–1.40)
HCT: 25 % — ABNORMAL LOW (ref 36.0–46.0)
Hemoglobin: 8.5 g/dL — ABNORMAL LOW (ref 12.0–15.0)
O2 Saturation: 98 %
Patient temperature: 97.2
Potassium: 4 mmol/L (ref 3.5–5.1)
Sodium: 138 mmol/L (ref 135–145)
TCO2: 29 mmol/L (ref 22–32)
pCO2 arterial: 46 mmHg (ref 32–48)
pH, Arterial: 7.381 (ref 7.35–7.45)
pO2, Arterial: 105 mmHg (ref 83–108)

## 2022-12-01 LAB — CBC
HCT: 22.3 % — ABNORMAL LOW (ref 36.0–46.0)
Hemoglobin: 7.8 g/dL — ABNORMAL LOW (ref 12.0–15.0)
MCH: 28.9 pg (ref 26.0–34.0)
MCHC: 35 g/dL (ref 30.0–36.0)
MCV: 82.6 fL (ref 80.0–100.0)
Platelets: 19 10*3/uL — CL (ref 150–400)
RBC: 2.7 MIL/uL — ABNORMAL LOW (ref 3.87–5.11)
RDW: 17.7 % — ABNORMAL HIGH (ref 11.5–15.5)
WBC: 15.2 10*3/uL — ABNORMAL HIGH (ref 4.0–10.5)
nRBC: 0 % (ref 0.0–0.2)

## 2022-12-01 LAB — HEMOGLOBIN AND HEMATOCRIT, BLOOD
HCT: 23.7 % — ABNORMAL LOW (ref 36.0–46.0)
HCT: 22.3 % — ABNORMAL LOW (ref 36.0–46.0)
Hemoglobin: 8.3 g/dL — ABNORMAL LOW (ref 12.0–15.0)
Hemoglobin: 7.7 g/dL — ABNORMAL LOW (ref 12.0–15.0)

## 2022-12-01 LAB — COMPREHENSIVE METABOLIC PANEL
ALT: 289 U/L — ABNORMAL HIGH (ref 0–44)
AST: 36 U/L (ref 15–41)
Albumin: 1.7 g/dL — ABNORMAL LOW (ref 3.5–5.0)
Alkaline Phosphatase: 192 U/L — ABNORMAL HIGH (ref 38–126)
Anion gap: 11 (ref 5–15)
BUN: 30 mg/dL — ABNORMAL HIGH (ref 6–20)
CO2: 25 mmol/L (ref 22–32)
Calcium: 7.1 mg/dL — ABNORMAL LOW (ref 8.9–10.3)
Chloride: 101 mmol/L (ref 98–111)
Creatinine, Ser: 1.05 mg/dL — ABNORMAL HIGH (ref 0.44–1.00)
GFR, Estimated: >60 mL/min (ref >60)
Glucose, Bld: 231 mg/dL — ABNORMAL HIGH (ref 70–99)
Potassium: 4 mmol/L (ref 3.5–5.1)
Sodium: 137 mmol/L (ref 135–145)
Total Bilirubin: 1.1 mg/dL (ref 0.3–1.2)
Total Protein: 4.5 g/dL — ABNORMAL LOW (ref 6.5–8.1)

## 2022-12-01 LAB — AMMONIA: Ammonia: 42 umol/L — ABNORMAL HIGH (ref 9–35)

## 2022-12-01 LAB — URINE DRUGS OF ABUSE SCREEN W ALC, ROUTINE (REF LAB)
Amphetamines, Urine: NEGATIVE ng/mL
Barbiturate, Ur: NEGATIVE ng/mL
Benzodiazepine Quant, Ur: NEGATIVE ng/mL
Cannabinoid Quant, Ur: NEGATIVE ng/mL
Cocaine (Metab.): NEGATIVE ng/mL
Methadone Screen, Urine: NEGATIVE ng/mL
Opiate Quant, Ur: NEGATIVE ng/mL
Phencyclidine, Ur: NEGATIVE ng/mL
Propoxyphene, Urine: NEGATIVE ng/mL

## 2022-12-01 LAB — MAGNESIUM
Magnesium: 2.1 mg/dL (ref 1.7–2.4)
Magnesium: 2 mg/dL (ref 1.7–2.4)

## 2022-12-01 LAB — HEPARIN INDUCED PLATELET AB (HIT ANTIBODY): Heparin Induced Plt Ab: 0.157 OD (ref 0.000–0.400)

## 2022-12-01 LAB — PHOSPHORUS
Phosphorus: 4.2 mg/dL (ref 2.5–4.6)
Phosphorus: 2.1 mg/dL — ABNORMAL LOW (ref 2.5–4.6)

## 2022-12-01 LAB — ETHANOL CONFIRM, URINE: Ethanol, Ur - Confirmation: 0.348 %

## 2022-12-01 MED ORDER — VITAL 1.5 CAL PO LIQD
1000.0000 mL | ORAL | Status: DC
  Administered 2022-12-02: 1000 mL

## 2022-12-01 MED ORDER — CLONAZEPAM 0.5 MG PO TBDP: 0.5000 mg | ORAL_TABLET | Freq: Three times a day (TID) | ORAL | Status: DC | PRN

## 2022-12-01 MED ORDER — FOLIC ACID 1 MG PO TABS
1.0000 mg | ORAL_TABLET | Freq: Every day | ORAL | Status: DC
  Administered 2022-12-01 – 2022-12-07 (×7): 1 mg
  Filled 2022-12-01 (×7): qty 1

## 2022-12-01 MED ORDER — CALCIUM GLUCONATE-NACL 1-0.675 GM/50ML-% IV SOLN
1.0000 g | Freq: Once | INTRAVENOUS | Status: AC
  Administered 2022-12-01: 1000 mg via INTRAVENOUS
  Filled 2022-12-01: qty 50

## 2022-12-01 MED ORDER — CLONAZEPAM 0.5 MG PO TBDP
0.5000 mg | ORAL_TABLET | Freq: Three times a day (TID) | ORAL | Status: DC
  Administered 2022-12-01: 0.5 mg
  Filled 2022-12-01: qty 1

## 2022-12-01 MED ORDER — POTASSIUM CHLORIDE 20 MEQ PO PACK
20.0000 meq | PACK | Freq: Once | ORAL | Status: DC
  Filled 2022-12-01: qty 1

## 2022-12-01 MED ORDER — QUETIAPINE FUMARATE 50 MG PO TABS
50.0000 mg | ORAL_TABLET | Freq: Two times a day (BID) | ORAL | Status: DC
  Administered 2022-12-01: 50 mg
  Filled 2022-12-01: qty 1

## 2022-12-01 MED ORDER — ALBUMIN HUMAN 25 % IV SOLN
25.0000 g | Freq: Once | INTRAVENOUS | Status: AC
  Administered 2022-12-01: 25 g via INTRAVENOUS
  Filled 2022-12-01: qty 100

## 2022-12-01 MED ORDER — METOCLOPRAMIDE HCL 5 MG/ML IJ SOLN
5.0000 mg | Freq: Four times a day (QID) | INTRAMUSCULAR | Status: DC
  Administered 2022-12-01 (×4): 5 mg via INTRAVENOUS
  Filled 2022-12-01 (×5): qty 2

## 2022-12-01 MED ORDER — MIDODRINE HCL 5 MG PO TABS
15.0000 mg | ORAL_TABLET | Freq: Three times a day (TID) | ORAL | Status: DC
  Administered 2022-12-01 – 2022-12-02 (×3): 15 mg
  Filled 2022-12-01 (×3): qty 3

## 2022-12-01 MED ORDER — OXYCODONE HCL 5 MG PO TABS
5.0000 mg | ORAL_TABLET | ORAL | Status: DC
  Administered 2022-12-01 (×2): 5 mg
  Filled 2022-12-01 (×2): qty 1

## 2022-12-01 MED ORDER — OXYCODONE HCL 5 MG PO TABS
5.0000 mg | ORAL_TABLET | ORAL | Status: DC | PRN
  Administered 2022-12-02 – 2022-12-03 (×3): 5 mg
  Filled 2022-12-01 (×3): qty 1

## 2022-12-01 MED ORDER — PANTOPRAZOLE SODIUM 40 MG IV SOLR
40.0000 mg | Freq: Every day | INTRAVENOUS | Status: DC
  Administered 2022-12-01 – 2022-12-07 (×7): 40 mg via INTRAVENOUS
  Filled 2022-12-01 (×7): qty 10

## 2022-12-01 MED ORDER — METOCLOPRAMIDE HCL 5 MG/ML IJ SOLN: 5.0000 mg | Freq: Four times a day (QID) | INTRAMUSCULAR | Status: DC

## 2022-12-01 MED ORDER — LACTULOSE 10 GM/15ML PO SOLN
10.0000 g | Freq: Once | ORAL | Status: AC
  Administered 2022-12-01: 10 g
  Filled 2022-12-01: qty 15

## 2022-12-01 MED ORDER — FUROSEMIDE 10 MG/ML IJ SOLN
40.0000 mg | Freq: Once | INTRAMUSCULAR | Status: DC
  Filled 2022-12-01: qty 4

## 2022-12-01 MED ORDER — DEXMEDETOMIDINE HCL IN NACL 400 MCG/100ML IV SOLN
0.0000 ug/kg/h | INTRAVENOUS | Status: DC
  Administered 2022-12-01: 0.5 ug/kg/h via INTRAVENOUS
  Administered 2022-12-01: 1.2 ug/kg/h via INTRAVENOUS
  Administered 2022-12-02: 0.8 ug/kg/h via INTRAVENOUS
  Filled 2022-12-01 (×4): qty 100

## 2022-12-01 NOTE — Progress Notes (Addendum)
NAME:  [name], MRN:  [id number], DOB:  Feb 23, 1986, LOS: 3 ADMISSION DATE:  11/27/2022, CONSULTATION DATE:  11/28/22 REFERRING MD:  Marlon Pel , CHIEF COMPLAINT:  ams   History of Present Illness:  37 yo woman with hx of DM1, HFrEF, HTN, anxiety/depression, cocaine use, DKA, presenting with DKA and ARDS.  Pertinent  Medical History  Home lantus 37m daily  Mod sliding scale insulin  Augmentin for pna (on d/c 2/1 ) x 3 days   Significant Hospital Events: Including procedures, antibiotic start and stop dates in addition to other pertinent events   2/12 admit, intubation, proning 2/13 vomited proned, supinated, another emesis episode later, PF ratio improving 2/14 cortrak post plyroic, trickle feeds, diuresing, weaning PEEP, trach asp c/w MSSA   Interim History / Subjective:  Off NE, but remains on vasopressin- BP still very liable at times Vent down to 5 peep and 40% Temp drop overnight requiring bairhugger Plts down to 19, H/H 9.2> 7.8 but no obvious signs of bleeding Had bilious drainage out of mouth yest, NGT output 200 ml since  CVP 5  Objective   Blood pressure (!) 142/91, pulse 62, temperature 99.3 F (37.4 C), temperature source Bladder, resp. rate (!) 28, height 5' (1.524 m), weight 43.5 kg, SpO2 100 %. CVP:  [4 mmHg-9 mmHg] 8 mmHg  Vent Mode: PRVC FiO2 (%):  [40 %-50 %] 40 % Set Rate:  [35 bmp] 35 bmp Vt Set:  [360 mL] 360 mL PEEP:  [5 cmH20-14 cmH20] 5 cmH20 Plateau Pressure:  [24 cmH20-25 cmH20] 24 cmH20   Intake/Output Summary (Last 24 hours) at 12/01/2022 0748 Last data filed at 12/01/2022 0600 Gross per 24 hour  Intake 2724.48 ml  Output 2400 ml  Net 324.48 ml   Filed Weights   11/27/22 2249 11/30/22 0500 12/01/22 0500  Weight: 39 kg 42.5 kg 43.5 kg    Examination: Propofol 25, dilaudid 1.5 General:  AoC ill appearing adult female lying in bed in NAD HEENT: MM pink/moist, ETT 7.5/ 22, cortrak/ OGT (minimal bilious) Neuro: will open eyes and turn  head to verbal, not following commands, no response to noxious stimuli in extremities but will move them spont CV: rr, NSR, no murmur PULM:  MV supported, clear, minimal secretions- at times speckled with black particles  GI: soft, bs hypo, NT/ ND, foley, +BM Extremities: warm/dry, slightly better anasarca    No NGT output 12hrs  UOP 2.4L/ ml/24hrs +3266m/24hrs Net +8.7L   Labs K 4, BUN/ sCr 27/ 1.01>  30/ 1.05, phos 4.2, Mag 2.1, WBC 26> 15, H/H 9.2/ 27> 7.8/ 22, plts 32> 19  Resolved Hospital Problem list     Assessment & Plan:   Shock, septic +/- sedation related, ?vasoplegia due to malnutrition  - NE off, BP remains sensitive to vaso titration.  Goal MAP > 65 - increase midodrine 10> 152mID.  If still needing vaso, consider adding droxidopa  - consider line holiday when off vaso, if further access needed, consider PICC  - trend CVP> holding diureses today given CVP 5 while still needing pressors  - Fungal BAL neg.  Pending aspergillus - ancef for MSSA PNA- 7 day course - trend WBC/ fever curve > improving but episode of hypothermia overnight  Acute hypoxemic and hypercarbic resp failure ARDS, resolving MSSA PNA - full MV support.  Minimize sedation, wake up, and start SBT trials today now on minimal vent settings.  - decrease RR to 20, cont to breathes over vent,  ABG  prn -VAP prevention protocol/ PPI -PAD protocol for sedation> stop dilaudid drip, change to prn, change propofol to precedex.  Adding enteral oxy IR, klonopin (given cocaine abuse), and seroquel.  RASS goal 0/-1.  Bowel regimen - sat goal SpO2 >92%  - cont ancef  - intermittent CXR - BD prn, aggressive pulm hygiene - SLP/ PT post extubation  Thrombocytopenia  Normocytic anemia  - given recent hospitalization (discharged 2/1) w/ ppx heparin exposure,  HIT ab sent 2/14, pending - T&S completed 2/14 - holding DVT ppx for now - DIC panel neg - H/H drop, monitor closely, no current signs of bleeding.   Check q6hrs - likely related to sepsis  Acute kidney injury Anasarca  - sCr likely does not accurately reflect AKI given poor muscle mass  - sCr stable.  Decent UOP from albumin/ lasix 16m yest.  > ideally would like to continue albumin/ lasix however CVP 5 and given ongoing pressor requirement will hold off.  - d/c foley  - Trend BMP / urinary output - Replace electrolytes as indicated - Avoid nephrotoxic agents, ensure adequate renal perfusion  Acute metabolic encephalopathy- related to ARDS, DKA, sepsis  - minimizing parental sedation, adding enteral sedation  - check ammonia  - consider checking ABG if not improving - delirium precautions  - supportive care as above   Severe protein calorie malnutrition Likely component of gastroparesis 2/2 poorly controlled DM High risk for refeeding syndrome/ electrolyte imbalances  - cortrak post-pyloric>trickle TF started 2/14> advance as tolerated per RD recs, monitor electrolytes closely   DKA- resolved - cont to wean D10 gtt,  trickle TF  - levemir 5 units BID - hold SSI while on D10 - CBG q 4 - eventually DM coordinator education for recurrent DKA hospitalizations/ poorly controlled, A1c > 15 (11/15/22)  Transaminitis, resolving - likely related to shock  - LFT continues to improve.  Check periodically  - ammonia 62 on 2/12> recheck now - hepatitis panel neg 2/13  Best Practice (right click and "Reselect all SmartList Selections" daily)   Diet/type: NPO; trickle TF DVT prophylaxis: SCD GI prophylaxis: PPI Lines: Central line and Arterial Line Foley:  Yes, and it is still needed Code Status:  full code Last date of multidisciplinary goals of care discussion [updated daughter and sister at bedside 2/12, still haven't talked to husband but they said they would relay message]  Pending update 2/15, no family at bedside.  RN reports family visited last evening.   CCT 36 mins    BKennieth Rad [facility] 12/01/2022, 7:49 AM  See Amion for pager If no response to pager, please call PCCM consult pager After 7:00 pm call Elink

## 2022-12-01 NOTE — Progress Notes (Signed)
Brief Nutrition Support Note  Pt last seen by RD for follow-up assessment on 11/29/22. Trickle tube feeds started yesterday, 11/30/22, after Cortrak was able to be advanced to proximal jejunum beyond the LOT. Pt tolerating trickle tube feeds.  Discussed with PCCM NP. Okay to begin slowly advancing tube feeding regimen to goal rate.  RD will adjust tube feeding orders: - Increase rate of Vital 1.5 to 30 ml/hr today at 1000. Continue to advance rate by 10 ml q 12 hours to goal rate of 45 ml/hr (1080 ml/day)   Tube feeding regimen at goal rate provides 1620 kcal, 73 grams of protein, and 825 ml of H2O.   Pt remains at refeeding risk as tube feeds are titrated to goal. Plan to continue to monitor magnesium, potassium, and phosphorus q 12 hours until TF has been at goal rate for 24 hours. Labs ordered.   Gustavus Bryant, MS, RD, LDN Inpatient Clinical Dietitian Please see AMiON for contact information.

## 2022-12-01 NOTE — Progress Notes (Signed)
[facility] Progress Note Patient Name: [name] DOB: December 17, 1985 MRN: [id number]   Date of Service  12/01/2022  HPI/Events of Note  Temp 35.3 despite optimizing room temperature and other warming measures.  eICU Interventions  [facility] ordered.        Kerry Kass Dezman Granda 12/01/2022, 3:40 AM

## 2022-12-01 NOTE — Progress Notes (Signed)
[facility] Progress Note Patient Name: Treneice Kahre DOB: 1986-07-09 MRN: [id number]   Date of Service  12/01/2022  HPI/Events of Note  Platelet count 19 K, no evidence of bleeding, HIT panel results pending but chemical DVT prophylaxis on hold.  eICU Interventions  Continue to observe closely without transfusion of platelets at this time, consider transfusion for any bleeding or platelet count < 10 K.        Frederik Pear 12/01/2022, 5:45 AM

## 2022-12-01 NOTE — Progress Notes (Signed)
Inpatient Diabetes Program Recommendations  AACE/ADA: New Consensus Statement on Inpatient Glycemic Control (2015)  Target Ranges:  Prepandial:   less than 140 mg/dL      Peak postprandial:   less than 180 mg/dL (1-2 hours)      Critically ill patients:  140 - 180 mg/dL   Lab Results  Component Value Date   GLUCAP 156 (H) 12/01/2022   HGBA1C  11/15/2022    DISREGARD RESULTS.  SEE [id number]. TESTING TO BE CONFIRMED AT [facility]. NOTIFIED BRITTANY CLAPP,RN AT [place] [id number] 11/15/2022 BY [facility]BEECH.   HGBA1C >15.5 (H) 11/15/2022    Review of Glycemic Control  Latest Reference Range & Units 11/30/22 11:31 11/30/22 15:50 11/30/22 19:20 11/30/22 23:13 12/01/22 03:07 12/01/22 07:39  Glucose-Capillary 70 - 99 mg/dL 77 101 (H) 118 (H) 158 (H) 210 (H) 156 (H)  Diabetes history: DM1 (does NOT make any insulin; requires basal, correction, and carb coverage insulin) Outpatient Diabetes medications: Lantus 35 units daily, Novolog 1 unit per 15 grams of carbs, Novolog 1 unit per 50 mg/dl above target of 140 mg/dl Current orders for Inpatient glycemic control:  Levemir 5 units bid Vital 45 ml/hr  Inpatient Diabetes Program Recommendations:   Consider restarting Novolog 0-6 units q 4 hours (even while on D10?).   Thanks,  Adah Perl, RN, BC-ADM Inpatient Diabetes Coordinator Pager [phone]  (8a-5p)

## 2022-12-02 DIAGNOSIS — E1011 Type 1 diabetes mellitus with ketoacidosis with coma: Secondary | ICD-10-CM | POA: Diagnosis not present

## 2022-12-02 LAB — GLUCOSE, CAPILLARY
Glucose-Capillary: 53 mg/dL — ABNORMAL LOW (ref 70–99)
Glucose-Capillary: 141 mg/dL — ABNORMAL HIGH (ref 70–99)
Glucose-Capillary: 73 mg/dL (ref 70–99)
Glucose-Capillary: 44 mg/dL — CL (ref 70–99)
Glucose-Capillary: 252 mg/dL — ABNORMAL HIGH (ref 70–99)
Glucose-Capillary: 114 mg/dL — ABNORMAL HIGH (ref 70–99)
Glucose-Capillary: 60 mg/dL — ABNORMAL LOW (ref 70–99)
Glucose-Capillary: 159 mg/dL — ABNORMAL HIGH (ref 70–99)
Glucose-Capillary: 215 mg/dL — ABNORMAL HIGH (ref 70–99)

## 2022-12-02 LAB — CBC
HCT: 21.1 % — ABNORMAL LOW (ref 36.0–46.0)
Hemoglobin: 7.2 g/dL — ABNORMAL LOW (ref 12.0–15.0)
MCH: 28.6 pg (ref 26.0–34.0)
MCHC: 34.1 g/dL (ref 30.0–36.0)
MCV: 83.7 fL (ref 80.0–100.0)
Platelets: 25 10*3/uL — CL (ref 150–400)
RBC: 2.52 MIL/uL — ABNORMAL LOW (ref 3.87–5.11)
RDW: 17.5 % — ABNORMAL HIGH (ref 11.5–15.5)
WBC: 5.4 10*3/uL (ref 4.0–10.5)
nRBC: 0 % (ref 0.0–0.2)

## 2022-12-02 LAB — MAGNESIUM
Magnesium: 1.8 mg/dL (ref 1.7–2.4)
Magnesium: 2.2 mg/dL (ref 1.7–2.4)

## 2022-12-02 LAB — POCT I-STAT EG7
Acid-Base Excess: 1 mmol/L (ref 0.0–2.0)
Bicarbonate: 26.6 mmol/L (ref 20.0–28.0)
Calcium, Ion: 1.03 mmol/L — ABNORMAL LOW (ref 1.15–1.40)
HCT: 26 % — ABNORMAL LOW (ref 36.0–46.0)
Hemoglobin: 8.8 g/dL — ABNORMAL LOW (ref 12.0–15.0)
O2 Saturation: 60 %
Patient temperature: 96.3
Potassium: 8.2 mmol/L (ref 3.5–5.1)
Sodium: 130 mmol/L — ABNORMAL LOW (ref 135–145)
TCO2: 28 mmol/L (ref 22–32)
pCO2, Ven: 41.8 mmHg — ABNORMAL LOW (ref 44–60)
pH, Ven: 7.406 (ref 7.25–7.43)
pO2, Ven: 29 mmHg — CL (ref 32–45)

## 2022-12-02 LAB — PHOSPHORUS
Phosphorus: 1.6 mg/dL — ABNORMAL LOW (ref 2.5–4.6)
Phosphorus: 4.6 mg/dL (ref 2.5–4.6)

## 2022-12-02 LAB — COMPREHENSIVE METABOLIC PANEL
ALT: 98 U/L — ABNORMAL HIGH (ref 0–44)
AST: 28 U/L (ref 15–41)
Albumin: 1.7 g/dL — ABNORMAL LOW (ref 3.5–5.0)
Alkaline Phosphatase: 182 U/L — ABNORMAL HIGH (ref 38–126)
Anion gap: 7 (ref 5–15)
BUN: 23 mg/dL — ABNORMAL HIGH (ref 6–20)
CO2: 25 mmol/L (ref 22–32)
Calcium: 6.9 mg/dL — ABNORMAL LOW (ref 8.9–10.3)
Chloride: 102 mmol/L (ref 98–111)
Creatinine, Ser: 0.81 mg/dL (ref 0.44–1.00)
GFR, Estimated: >60 mL/min (ref >60)
Glucose, Bld: 139 mg/dL — ABNORMAL HIGH (ref 70–99)
Potassium: 3 mmol/L — ABNORMAL LOW (ref 3.5–5.1)
Sodium: 134 mmol/L — ABNORMAL LOW (ref 135–145)
Total Bilirubin: 0.5 mg/dL (ref 0.3–1.2)
Total Protein: 4.4 g/dL — ABNORMAL LOW (ref 6.5–8.1)

## 2022-12-02 LAB — TRIGLYCERIDES: Triglycerides: 81 mg/dL (ref <150)

## 2022-12-02 LAB — ASPERGILLUS ANTIGEN, BAL/SERUM: Aspergillus Ag, BAL/Serum: 0.26 Index (ref 0.00–0.49)

## 2022-12-02 LAB — LACTIC ACID, PLASMA: Lactic Acid, Venous: 1.3 mmol/L (ref 0.5–1.9)

## 2022-12-02 LAB — POTASSIUM: Potassium: 4.9 mmol/L (ref 3.5–5.1)

## 2022-12-02 MED ORDER — ORAL CARE MOUTH RINSE: 15.0000 mL | OROMUCOSAL | Status: DC | PRN

## 2022-12-02 MED ORDER — DEXTROSE 10 % IV SOLN: INTRAVENOUS | Status: DC

## 2022-12-02 MED ORDER — CARBAMIDE PEROXIDE 6.5 % OT SOLN
5.0000 [drp] | Freq: Two times a day (BID) | OTIC | Status: DC
  Administered 2022-12-02 – 2022-12-07 (×11): 5 [drp] via OTIC
  Filled 2022-12-02 (×2): qty 15

## 2022-12-02 MED ORDER — VITAL AF 1.2 CAL PO LIQD
1000.0000 mL | ORAL | Status: DC
  Administered 2022-12-02: 1000 mL

## 2022-12-02 MED ORDER — THIAMINE MONONITRATE 100 MG PO TABS
100.0000 mg | ORAL_TABLET | Freq: Every day | ORAL | Status: DC
  Administered 2022-12-02 – 2022-12-07 (×6): 100 mg
  Filled 2022-12-02 (×6): qty 1

## 2022-12-02 MED ORDER — DEXTROSE 50 % IV SOLN
25.0000 g | INTRAVENOUS | Status: AC
  Administered 2022-12-02: 25 g via INTRAVENOUS

## 2022-12-02 MED ORDER — PROSOURCE TF20 ENFIT COMPATIBL EN LIQD
60.0000 mL | Freq: Three times a day (TID) | ENTERAL | Status: DC
  Administered 2022-12-02 – 2022-12-05 (×9): 60 mL
  Filled 2022-12-02 (×9): qty 60

## 2022-12-02 MED ORDER — POTASSIUM CHLORIDE 20 MEQ PO PACK
40.0000 meq | PACK | Freq: Once | ORAL | Status: AC
  Administered 2022-12-02: 40 meq
  Filled 2022-12-02: qty 2

## 2022-12-02 MED ORDER — INSULIN ASPART 100 UNIT/ML IJ SOLN
0.0000 [IU] | INTRAMUSCULAR | Status: DC
  Administered 2022-12-03: 2 [IU] via SUBCUTANEOUS
  Administered 2022-12-03 (×3): 1 [IU] via SUBCUTANEOUS
  Administered 2022-12-04: 3 [IU] via SUBCUTANEOUS
  Administered 2022-12-04: 1 [IU] via SUBCUTANEOUS
  Administered 2022-12-04: 3 [IU] via SUBCUTANEOUS
  Administered 2022-12-04: 2 [IU] via SUBCUTANEOUS
  Administered 2022-12-04: 3 [IU] via SUBCUTANEOUS
  Administered 2022-12-04: 2 [IU] via SUBCUTANEOUS
  Administered 2022-12-05: 3 [IU] via SUBCUTANEOUS
  Administered 2022-12-05: 1 [IU] via SUBCUTANEOUS
  Administered 2022-12-05 (×2): 3 [IU] via SUBCUTANEOUS
  Administered 2022-12-05: 2 [IU] via SUBCUTANEOUS
  Administered 2022-12-05: 1 [IU] via SUBCUTANEOUS
  Administered 2022-12-06: 3 [IU] via SUBCUTANEOUS
  Administered 2022-12-06: 1 [IU] via SUBCUTANEOUS
  Administered 2022-12-06: 3 [IU] via SUBCUTANEOUS

## 2022-12-02 MED ORDER — METOCLOPRAMIDE HCL 5 MG/ML IJ SOLN
5.0000 mg | Freq: Three times a day (TID) | INTRAMUSCULAR | Status: DC
  Administered 2022-12-02 – 2022-12-07 (×15): 5 mg via INTRAVENOUS
  Filled 2022-12-02 (×16): qty 2

## 2022-12-02 MED ORDER — MIDODRINE HCL 5 MG PO TABS
20.0000 mg | ORAL_TABLET | Freq: Three times a day (TID) | ORAL | Status: DC
  Administered 2022-12-02 – 2022-12-04 (×6): 20 mg
  Filled 2022-12-02 (×8): qty 4

## 2022-12-02 MED ORDER — POTASSIUM PHOSPHATES 15 MMOLE/5ML IV SOLN
45.0000 mmol | Freq: Once | INTRAVENOUS | Status: AC
  Administered 2022-12-02: 45 mmol via INTRAVENOUS
  Filled 2022-12-02: qty 15

## 2022-12-02 MED ORDER — MAGNESIUM SULFATE 2 GM/50ML IV SOLN
2.0000 g | Freq: Once | INTRAVENOUS | Status: AC
  Administered 2022-12-02: 2 g via INTRAVENOUS
  Filled 2022-12-02: qty 50

## 2022-12-02 MED ORDER — ORAL CARE MOUTH RINSE
15.0000 mL | OROMUCOSAL | Status: DC
  Administered 2022-12-02 – 2022-12-07 (×20): 15 mL via OROMUCOSAL

## 2022-12-02 NOTE — Progress Notes (Signed)
Inpatient Diabetes Program Recommendations  AACE/ADA: New Consensus Statement on Inpatient Glycemic Control (2015)  Target Ranges:  Prepandial:   less than 140 mg/dL      Peak postprandial:   less than 180 mg/dL (1-2 hours)      Critically ill patients:  140 - 180 mg/dL   Lab Results  Component Value Date   GLUCAP 44 (LL) 12/02/2022   HGBA1C  11/15/2022    DISREGARD RESULTS.  SEE [id number]. TESTING TO BE CONFIRMED AT [facility]. NOTIFIED BRITTANY CLAPP,RN AT [place] [id number] 11/15/2022 BY ZBEECH.   HGBA1C >15.5 (H) 11/15/2022    Review of Glycemic Control  Latest Reference Range & Units 12/01/22 20:18 12/01/22 23:06 12/02/22 03:10 12/02/22 04:02 12/02/22 07:17 12/02/22 10:23  Glucose-Capillary 70 - 99 mg/dL 136 (H) 99 53 (L) 141 (H) 73 44 (LL)  Diabetes history: DM1 (does NOT make any insulin; requires basal, correction, and carb coverage insulin) Outpatient Diabetes medications: Lantus 35 units daily, Novolog 1 unit per 15 grams of carbs, Novolog 1 unit per 50 mg/dl above target of 140 mg/dl Current orders for Inpatient glycemic control:  Levemir 5 units bid Vital 45 ml/hr Inpatient Diabetes Program Recommendations:    Unsure why blood sugars are trending low. Levemir held this AM.  Patient extubated this AM however feeds still going.  May need to reduce basal further to once a day (if changed to once a day, consider Semglee 5 units daily).  Agree with restart of very sensitive correction as well.   Thanks,  Adah Perl, RN, BC-ADM Inpatient Diabetes Coordinator Pager [phone]  (8a-5p)

## 2022-12-02 NOTE — Progress Notes (Addendum)
1915 patient alert able to make all needs known tube feeing running D10 running no order medication taken down  2100 patient using bedpan that commode bladder distended and painful   2130 bladder scanned after urinating twice and bladder scan was greater than 750 ml straight cath 800 ml ABD pain relieved after straight cath completed    2215 glucose 60 called Elink explained D10 from earlier to get new orders 1 AMP dextrose given

## 2022-12-02 NOTE — Plan of Care (Signed)
Problem: Education: Goal: Ability to describe self-care measures that may prevent or decrease complications (Diabetes Survival Skills Education) will improve Outcome: Not Progressing Goal: Individualized Educational Video(s) Outcome: Not Progressing   Problem: Cardiac: Goal: Ability to maintain an adequate cardiac output will improve Outcome: Not Progressing   Problem: Health Behavior/Discharge Planning: Goal: Ability to identify and utilize available resources and services will improve Outcome: Not Progressing Goal: Ability to manage health-related needs will improve Outcome: Not Progressing   Problem: Fluid Volume: Goal: Ability to achieve a balanced intake and output will improve Outcome: Not Progressing   Problem: Metabolic: Goal: Ability to maintain appropriate glucose levels will improve Outcome: Not Progressing   Problem: Nutritional: Goal: Maintenance of adequate nutrition will improve Outcome: Not Progressing Goal: Maintenance of adequate weight for body size and type will improve Outcome: Not Progressing   Problem: Respiratory: Goal: Will regain and/or maintain adequate ventilation Outcome: Not Progressing   Problem: Urinary Elimination: Goal: Ability to achieve and maintain adequate renal perfusion and functioning will improve Outcome: Not Progressing   Problem: Education: Goal: Ability to describe self-care measures that may prevent or decrease complications (Diabetes Survival Skills Education) will improve Outcome: Not Progressing Goal: Individualized Educational Video(s) Outcome: Not Progressing   Problem: Coping: Goal: Ability to adjust to condition or change in health will improve Outcome: Not Progressing   Problem: Fluid Volume: Goal: Ability to maintain a balanced intake and output will improve Outcome: Not Progressing   Problem: Health Behavior/Discharge Planning: Goal: Ability to identify and utilize available resources and services will  improve Outcome: Not Progressing Goal: Ability to manage health-related needs will improve Outcome: Not Progressing   Problem: Metabolic: Goal: Ability to maintain appropriate glucose levels will improve Outcome: Not Progressing   Problem: Nutritional: Goal: Maintenance of adequate nutrition will improve Outcome: Not Progressing Goal: Progress toward achieving an optimal weight will improve Outcome: Not Progressing   Problem: Skin Integrity: Goal: Risk for impaired skin integrity will decrease Outcome: Not Progressing   Problem: Tissue Perfusion: Goal: Adequacy of tissue perfusion will improve Outcome: Not Progressing   Problem: Education: Goal: Knowledge of General Education information will improve Description: Including pain rating scale, medication(s)/side effects and non-pharmacologic comfort measures Outcome: Not Progressing   Problem: Health Behavior/Discharge Planning: Goal: Ability to manage health-related needs will improve Outcome: Not Progressing   Problem: Clinical Measurements: Goal: Ability to maintain clinical measurements within normal limits will improve Outcome: Not Progressing Goal: Will remain free from infection Outcome: Not Progressing Goal: Diagnostic test results will improve Outcome: Not Progressing Goal: Respiratory complications will improve Outcome: Not Progressing Goal: Cardiovascular complication will be avoided Outcome: Not Progressing   Problem: Activity: Goal: Risk for activity intolerance will decrease Outcome: Not Progressing   Problem: Nutrition: Goal: Adequate nutrition will be maintained Outcome: Not Progressing   Problem: Coping: Goal: Level of anxiety will decrease Outcome: Not Progressing   Problem: Elimination: Goal: Will not experience complications related to bowel motility Outcome: Not Progressing Goal: Will not experience complications related to urinary retention Outcome: Not Progressing   Problem: Pain  Managment: Goal: General experience of comfort will improve Outcome: Not Progressing   Problem: Safety: Goal: Ability to remain free from injury will improve Outcome: Not Progressing   Problem: Skin Integrity: Goal: Risk for impaired skin integrity will decrease Outcome: Not Progressing   Problem: Safety: Goal: Non-violent Restraint(s) Outcome: Completed/Met   Problem: Education: Goal: Knowledge of disease or condition will improve Outcome: Not Progressing Goal: Understanding of discharge needs will improve Outcome: Not  Progressing   Problem: Health Behavior/Discharge Planning: Goal: Ability to identify changes in lifestyle to reduce recurrence of condition will improve Outcome: Not Progressing Goal: Identification of resources available to assist in meeting health care needs will improve Outcome: Not Progressing   Problem: Physical Regulation: Goal: Complications related to the disease process, condition or treatment will be avoided or minimized Outcome: Not Progressing   Problem: Safety: Goal: Ability to remain free from injury will improve Outcome: Not Progressing

## 2022-12-02 NOTE — Progress Notes (Signed)
[facility] ADULT ICU REPLACEMENT PROTOCOL   The patient does apply for the [facility] Adult ICU Electrolyte Replacment Protocol based on the criteria listed below:   1.Exclusion criteria: TCTS, ECMO, Dialysis, and Myasthenia Gravis patients 2. Is GFR >/= 30 ml/min? Yes.    Patient's GFR today is >60 3. Is SCr </= 2? Yes.   Patient's SCr is 0.81 mg/dL 4. Did SCr increase >/= 0.5 in 24 hours? No. 5.Pt's weight >40kg  Yes.   6. Abnormal electrolyte(s): K+ 3.0, Phos 1.6, mag 1.8  7. Electrolytes replaced per protocol 8.  Call MD STAT for K+ </= 2.5, Phos </= 1, or Mag </= 1 Physician:  Eula Fried [facility] 12/02/2022 4:59 AM

## 2022-12-02 NOTE — Progress Notes (Signed)
Inpatient Rehab Admissions Coordinator Note:  Per therapy recommendations patient was screened for CIR candidacy by Michel Santee, PT. At this time, note pt requiring 35L HFNC.  We will not pursue a rehab consult at this time; however, CIR admissions team will follow for medical readiness and place consult when closer to being ready.  Please contact me with questions.    Shann Medal, PT, DPT [id number] 12/02/22  3:54 PM

## 2022-12-02 NOTE — Progress Notes (Signed)
NAME:  [name], MRN:  [id number], DOB:  [birth year], LOS: 4 ADMISSION DATE:  11/27/2022, CONSULTATION DATE:  11/28/22 REFERRING MD:  Marlon Pel , CHIEF COMPLAINT:  ams   History of Present Illness:  37 yo woman with hx of DM1, HFrEF, HTN, anxiety/depression, cocaine use, DKA, presenting with DKA and ARDS.  Pertinent  Medical History  Home lantus 30m daily  Mod sliding scale insulin  Augmentin for pna (on d/c 2/1 ) x 3 days   Significant Hospital Events: Including procedures, antibiotic start and stop dates in addition to other pertinent events   2/12 admit, intubation, proning 2/13 vomited proned, supinated, another emesis episode later, PF ratio improving 2/14 cortrak post plyroic, trickle feeds, diuresing, weaning PEEP, trach asp c/w MSSA  2/16 Weaning to Extubation, remove OG, continue to keep cortrak for feeds Interim History / Subjective:  Currently Weaning Wean to extubate to [place]    Objective   Blood pressure 137/78, pulse (!) 57, temperature 98.7 F (37.1 C), temperature source Axillary, resp. rate 16, height 5' (1.524 m), weight 45.7 kg, SpO2 96 %. CVP:  [5 mmHg-8 mmHg] 8 mmHg  Vent Mode: PRVC FiO2 (%):  [40 %] 40 % Set Rate:  [20 bmp] 20 bmp Vt Set:  [360 mL] 360 mL PEEP:  [5 cmH20] 5 cmH20 Pressure Support:  [5 cmH20] 5 cmH20 Plateau Pressure:  [15 cmH20-19 cmH20] 17 cmH20   Intake/Output Summary (Last 24 hours) at 12/02/2022 0725 Last data filed at 12/02/2022 0600 Gross per 24 hour  Intake 1759.21 ml  Output 930 ml  Net 829.21 ml    Filed Weights   11/30/22 0500 12/01/22 0500 12/02/22 0500  Weight: 42.5 kg 43.5 kg 45.7 kg    Examination: General: malnourished, acutely ill female lying in ICU bed  HEENT: MM pink/moist, ETT 7.5/ 22, cortrak, for feeding, OGT clamped, noticeable cerumen drainage right ear Neuro: Alert, following commands with ease  CV: s1 and s2 auscultated, no murmurs, gallops, rubs PULM: Clear and diminished BL breath sounds, clear  upper lung fields and diminished lower lung fields GI: Active BS in all quadrants, Cortrak and OG in place, receiving feeding via cortrak  Extremities: 1+ generalized edema throughout upper and lower extremities, able to move all extremities     Resolved Hospital Problem list     Assessment & Plan:   Shock resolving, septic vs vasoplegia due to malnutrition  Vasopressin stopped 2/16  MSSA PNA Fungal BAL neg.  Still Pending aspergillus WBC 5.4 from 15. 2, low grade fever over night  P: ancef for MSSA PNA, continue 7 day course [place] Goal of > or equal to 60  Patient started on midodrine 262mTID  Continue to follow CVPs Q4    Acute hypoxemic and hypercarbic resp failure ARDS, resolving MSSA PNA Patient weaning, follow commands, no labored or increased work of breathing during weaning Precedex off  P: Wean to extubate Follow post-extubation orders  Mobilize when able, aggressive pulmonary hygiene   Thrombocytopenia  Normocytic anemia  - given recent hospitalization (discharged 2/1) w/ ppx heparin exposure,  2/14 HIT ab panel negtive  2/16 Platelets 25 from 19  P: Continue to follow CBC-morning labs   Acute kidney injury resolving in the setting of Anasarca  Creatinine 0.81 from 1.05  P: Continue to Trend BMP / urinary output Continue to Replace electrolytes if needed  Continue to Avoid nephrotoxic agents, ensure adequate renal perfusion  Acute metabolic encephalopathy- related to ARDS, DKA, sepsis  2/15 ammonia level  42  Klonopin 0.57m per tube  P:  Continue minimizing parental sedation,DC precedex Delirium precautions  keep lights on during day, off during night  Work on mobilizing after extubation/PT and OT consult placed   Severe protein calorie malnutrition Likely component of gastroparesis 2/2 poorly controlled DM High risk for refeeding syndrome/ electrolyte imbalances  2/14> advance as tolerated per RD recs, monitor electrolytes closely   P: Advance tube feeds as tolerated, keep cortrak after extubation  hyperglycemia  2/16 Glucose on BMP 139  P: Continue SSI and long acting levemir 538mBID Continue tube feeding via cortrak    Transaminitis, resolving Suspecting related to shock  AST 28 from 36 ALT 98 from 289 - ammonia 42 P: Continue to follow liver function panels    Best Practice (right click and "Reselect all SmartList Selections" daily)   Diet/type: Trickle Tube feedings  DVT prophylaxis: SCD GI prophylaxis: PPI Lines: Central line and Arterial Line Foley:  Purewick  Code Status:  full code Last date of multidisciplinary goals of care discussion [ will update family 2/16, currently no family at bedside   CRITICAL CARE Performed by: JoWaldo LaineSN, RN, S-ACNP    Total critical care time: 40 minutes  Critical care time was exclusive of separately billable procedures and treating other patients.  Critical care was necessary to treat or prevent imminent or life-threatening deterioration.  Critical care was time spent personally by me on the following activities: development of treatment plan with patient and/or surrogate as well as nursing, discussions with consultants, evaluation of patient's response to treatment, examination of patient, obtaining history from patient or surrogate, ordering and performing treatments and interventions, ordering and review of laboratory studies, ordering and review of radiographic studies, pulse oximetry and re-evaluation of patient's condition.    JoWaldo LaineSN, RN, S-ACNP   See Amion for pager If no response to pager, please call PCCM consult pager After 7:00 pm call Elink

## 2022-12-02 NOTE — Progress Notes (Signed)
Nutrition Follow-up  DOCUMENTATION CODES:   Underweight, Severe malnutrition in context of chronic illness  INTERVENTION:   Tube Feeding via Cortrak:  Change to Vital AF 1.2 and reduce rate back to 20 ml/hr until electrolytes stable, highly suspicious for refeeding syndrome Add Pro-Source TF20 60 mL TID while on trickles  TF goal regimen Vital AF 1.2 at 55 ml/hr providing 99 g of protein, 1584 kcals and 1069 mL of free water  Recommend continued close monitoring of electrolytes with supplementation  If hypoglycemia persists despite change in insulin, recommend addition of IV dextrose infusion even while on TF  Discussed resuming reglan given pt with hx of N/V with long standing poorly controlled DM, recent dx of gastroparesis per pt  Continue thiamine, folic acid   NUTRITION DIAGNOSIS:   Severe Malnutrition related to chronic illness (uncontrolled T1DM (hemoglobin A1C >15.5), HFrEF) as evidenced by severe fat depletion, severe muscle depletion, percent weight loss (18.9% weight loss in < 2 months).  Being addressed via nutrition support  GOAL:   Patient will meet greater than or equal to 90% of their needs  Progressing  MONITOR:   Vent status, Labs, Weight trends, TF tolerance  REASON FOR ASSESSMENT:   Consult Enteral/tube feeding initiation and management (trickle TF)  ASSESSMENT:   37 year old female who presented to the ED on 2/11 with hyperglycemia and AMS. PMH of T1DM, cocaine abuse, HFrEF, HTN, anxiety, depression. Pt admitted with DKA.  02/12 Intubated, Cortrak placement (tip at the pylorus), pt proned 02/13 Pt vomited, OG tube placed to LIWS, pt unproned  02/14 Trickle TF initiated, Cortrak advanced to LOT 02/16 Extubated  Extubated this AM, currently on HFNC, sitting up in chair. Pt dry heaving upon visit. TF infusing at 40 ml/hr, Cortrak tip is in jejunum.   Pt reports long hx of N/V, pt reports she was recently started on reglan with new dx of  gastroparesis. Noted reglan held today, will discuss with team  Hypoglycemic CBGs 49-151, levemir 5 units BID yesterday,  no sliding scale. Today levemir discontinued, only on very sensitive sliding scale. Hypoglycemia despite TF infusing at 40 ml/hr Noted pt previously on trickle TF with D10 infusion. It is possible that pt has no glycogen stores given severe malnutrition and  suspected poor nutrient intake and  that is contributing to hypoglycemia. It is concerning however that despite continuous TF infusion pt with hypoglycemia  Current wt 45.7 kg, weight trending up. Admit wt closer to 39 kg. Noted bilateral edema in all extremities UOP 825 mL in 24 hours; net + 9 L per I/O flow sheet  Despite significant muscle wasting on exam, noted abdominal distention. Per pt, this is normal  Labs: phosphorus 1.6 (L), sodium 134 (L), potassium 3.0 (L) Meds: reglan, lactulose, mag sulfate, folic acid, potassium phosphate, thiamine, miralax   Diet Order:   Diet Order             Diet NPO time specified  Diet effective now                   EDUCATION NEEDS:   Not appropriate for education at this time  Skin:  Skin Assessment: Skin Integrity Issues: Skin Integrity Issues:: Other (Comment) Other: fissures to bilateral heels, fissures to bilateral hands  Last BM:  2/15  Height:   Ht Readings from Last 1 Encounters:  11/27/22 5' (1.524 m)    Weight:   Wt Readings from Last 1 Encounters:  12/02/22 45.7 kg    Ideal Body  Weight:  45.5 kg  BMI:  Body mass index is 19.68 kg/m.  Estimated Nutritional Needs:   Kcal:  1500-1700  Protein:  70-85 g  Fluid:  1.5-1.7 L   Kerman Passey MS, RDN, LDN, CNSC Registered Dietitian 3 Clinical Nutrition RD Pager and On-Call Pager Number Located in [place]

## 2022-12-02 NOTE — Evaluation (Signed)
Physical Therapy Evaluation Patient Details Name: [name] MRN: [id number] DOB: 06-13-1986 Today's Date: 12/02/2022  History of Present Illness  37 yo female admitted 2/11 with DKA with ARDS and intubation 2/12-2/16. PMHx: DM, HFrEF, HTN, anxiety, cocaine abuse  Clinical Impression  Pt awake, alert, confused, not oriented to time or place. Pt with decreased strength, transfers, function and mobility who will benefit from acute therapy to maximize mobility and independence to decrease burden of care. Pt lives with her daughter, does not work, drives and has [id number] available assist of family. Anticipate pt need AIR if she does not demonstrate quick progression acutely for return home.   Pt on 10L with SpO2 85-92% with activity HR 75       Recommendations for follow up therapy are one component of a multi-disciplinary discharge planning process, led by the attending physician.  Recommendations may be updated based on patient status, additional functional criteria and insurance authorization.  Follow Up Recommendations Acute inpatient rehab (3hours/day)      Assistance Recommended at Discharge Frequent or constant Supervision/Assistance  Patient can return home with the following  A lot of help with walking and/or transfers;A lot of help with bathing/dressing/bathroom;Assistance with cooking/housework;Direct supervision/assist for medications management;Assist for transportation;Help with stairs or ramp for entrance;Direct supervision/assist for financial management    Equipment Recommendations Rolling walker (2 wheels);BSC/3in1  Recommendations for Other Services  OT consult;Speech consult    Functional Status Assessment Patient has had a recent decline in their functional status and demonstrates the ability to make significant improvements in function in a reasonable and predictable amount of time.     Precautions / Restrictions Precautions Precautions: Fall;Other  (comment) Precaution Comments: watch sats, cortrak      Mobility  Bed Mobility Overal bed mobility: Needs Assistance Bed Mobility: Supine to Sit     Supine to sit: HOB elevated, Min assist     General bed mobility comments: HOB 35 degrees    Transfers Overall transfer level: Needs assistance   Transfers: Sit to/from Stand, Bed to chair/wheelchair/BSC Sit to Stand: Min assist Stand pivot transfers: Min assist         General transfer comment: min assist with bil UE support to stand from bed and from chair and pivot bed>chair. Pt denied further mobility due to fatigue/weakness    Ambulation/Gait                  Stairs            Wheelchair Mobility    Modified Rankin (Stroke Patients Only)       Balance Overall balance assessment: Needs assistance Sitting-balance support: Feet supported, No upper extremity supported Sitting balance-Leahy Scale: Fair     Standing balance support: Single extremity supported Standing balance-Leahy Scale: Fair                               Pertinent Vitals/Pain Pain Assessment Pain Assessment: Faces Pain Score: 5  Pain Location: abdomen Pain Descriptors / Indicators: Aching Pain Intervention(s): Limited activity within patient's tolerance, Repositioned, Monitored during session    [place] expects to be discharged to:: Private residence Living Arrangements: Spouse/significant other;Children Available Help at Discharge: Family;Available 24 hours/day Type of Home: House Home Access: Stairs to enter Entrance Stairs-Rails: Psychiatric nurse of Steps: 2   Home Layout: One level Home Equipment: None      Prior Function Prior Level of Function : Independent/Modified Independent;Driving  Hand Dominance        Extremity/Trunk Assessment   Upper Extremity Assessment Upper Extremity Assessment: Generalized weakness (pt with grossly  3/5 strength, decreased fine motor and grip with very calloused hands with limited dexterity)    Lower Extremity Assessment Lower Extremity Assessment: Generalized weakness    Cervical / Trunk Assessment Cervical / Trunk Assessment: Normal  Communication   Communication: No difficulties  Cognition Arousal/Alertness: Awake/alert Behavior During Therapy: Flat affect Overall Cognitive Status: Impaired/Different from baseline Area of Impairment: Orientation, Attention, Memory, Following commands, Safety/judgement, Awareness                 Orientation Level: Disoriented to, Time, Place Current Attention Level: Sustained Memory: Decreased short-term memory Following Commands: Follows one step commands inconsistently, Follows one step commands with increased time Safety/Judgement: Decreased awareness of safety, Decreased awareness of deficits Awareness: Intellectual   General Comments: pt with inability to hear out of right ear which is new needing repetition of cues and questions. Stating date as Jan 2022. Pt looking to daughter to answer several questions.        General Comments      Exercises     Assessment/Plan    PT Assessment Patient needs continued PT services  PT Problem List Decreased strength;Decreased mobility;Decreased safety awareness;Decreased coordination;Decreased activity tolerance;Decreased cognition;Cardiopulmonary status limiting activity;Decreased balance;Decreased knowledge of use of DME       PT Treatment Interventions Gait training;Therapeutic exercise;Patient/family education;DME instruction;Therapeutic activities;Cognitive remediation;Balance training;Stair training;Functional mobility training;Neuromuscular re-education    PT Goals (Current goals can be found in the Care Plan section)  Acute Rehab PT Goals Patient Stated Goal: return home PT Goal Formulation: With patient/family Time For Goal Achievement: 12/16/22 Potential to Achieve Goals:  Good    Frequency Min 3X/week     Co-evaluation               AM-PAC PT "6 Clicks" Mobility  Outcome Measure Help needed turning from your back to your side while in a flat bed without using bedrails?: A Little Help needed moving from lying on your back to sitting on the side of a flat bed without using bedrails?: A Little Help needed moving to and from a bed to a chair (including a wheelchair)?: A Little Help needed standing up from a chair using your arms (e.g., wheelchair or bedside chair)?: A Little Help needed to walk in hospital room?: Total Help needed climbing 3-5 steps with a railing? : Total 6 Click Score: 14    End of Session Equipment Utilized During Treatment: Oxygen Activity Tolerance: Patient limited by fatigue Patient left: in chair;with call bell/phone within reach;with chair alarm set;with family/visitor present Nurse Communication: Mobility status PT Visit Diagnosis: Other abnormalities of gait and mobility (R26.89);Muscle weakness (generalized) (M62.81)    Time: [id number] PT Time Calculation (min) (ACUTE ONLY): 22 min   Charges:   PT Evaluation $PT Eval Moderate Complexity: 1 Mod          Angy Swearengin P, PT Acute Rehabilitation Services Office: [phone]   Sandy Salaam Jibran Crookshanks 12/02/2022, 11:42 AM

## 2022-12-02 NOTE — TOC Progression Note (Signed)
Transition of Care [facility]) - Progression Note    Patient Details  Name: [name] MRN: [id number] Date of Birth: September 14, 1986  [facility] CM/SW Contact  Tom-Johnson, Renea Ee, RN Phone Number: 12/02/2022, 1:20 PM  Clinical Narrative:     Patient extubated today, on 5L O2 [place]. Has Cortrak post Pyloric with  trickle feeds, diuresing. On IV abx for MSSA PNA.  Up in chair today, working with PT. CIR recommended, awaiting approval. CM will continue to follow as patient progresses with care towards discharge.        Expected Discharge Plan and Services                                               Social Determinants of Health (SDOH) Interventions SDOH Screenings   Food Insecurity: No Food Insecurity (11/16/2022)  Housing: Low Risk  (11/16/2022)  Transportation Needs: No Transportation Needs (11/16/2022)  Tobacco Use: High Risk (11/17/2022)    Readmission Risk Interventions    11/16/2022    1:57 PM 03/11/2021    3:39 PM  Readmission Risk Prevention Plan  Transportation Screening Complete Complete  PCP or Specialist Appt within 5-7 Days Complete   PCP or Specialist Appt within 3-5 Days  Complete  Home Care Screening Complete   Medication Review (RN CM) Referral to Pharmacy   HRI or [place]  Complete  Social Work Consult for [place] Planning/Counseling  Complete  Palliative Care Screening  Not Applicable  Medication Review Press photographer)  Complete

## 2022-12-02 NOTE — Procedures (Signed)
Extubation Procedure Note  Patient Details:   Name: Elianie Mamon DOB: [birth year] MRN: [id number]   Airway Documentation:    Vent end date: 12/02/22 Vent end time: 0908   Evaluation  O2 sats: transiently fell during during procedure Complications: No apparent complications Patient did tolerate procedure well. Bilateral Breath Sounds: Clear, Diminished   Yes  Positive cuff leak noted. Patient placed on Drew 5 L, sats dropped to 87%.  Patient placed on [place] HFNC 10L, sats improved to 97%. No stridor noted. Patient able to reach 825 mL using the incentive spirometer.  Bayard Beaver 12/02/2022, 9:14 AM

## 2022-12-03 ENCOUNTER — Encounter (HOSPITAL_COMMUNITY): Payer: Self-pay | Admitting: Pulmonary Disease

## 2022-12-03 ENCOUNTER — Inpatient Hospital Stay (HOSPITAL_COMMUNITY): Payer: Medicaid Other

## 2022-12-03 DIAGNOSIS — E1011 Type 1 diabetes mellitus with ketoacidosis with coma: Secondary | ICD-10-CM | POA: Diagnosis not present

## 2022-12-03 LAB — BASIC METABOLIC PANEL
Anion gap: 9 (ref 5–15)
BUN: 24 mg/dL — ABNORMAL HIGH (ref 6–20)
CO2: 25 mmol/L (ref 22–32)
Calcium: 7.5 mg/dL — ABNORMAL LOW (ref 8.9–10.3)
Chloride: 106 mmol/L (ref 98–111)
Creatinine, Ser: 0.85 mg/dL (ref 0.44–1.00)
GFR, Estimated: >60 mL/min (ref >60)
Glucose, Bld: 240 mg/dL — ABNORMAL HIGH (ref 70–99)
Potassium: 4.6 mmol/L (ref 3.5–5.1)
Sodium: 140 mmol/L (ref 135–145)

## 2022-12-03 LAB — COMPREHENSIVE METABOLIC PANEL
ALT: 47 U/L — ABNORMAL HIGH (ref 0–44)
AST: 34 U/L (ref 15–41)
Albumin: 1.7 g/dL — ABNORMAL LOW (ref 3.5–5.0)
Alkaline Phosphatase: 168 U/L — ABNORMAL HIGH (ref 38–126)
Anion gap: 9 (ref 5–15)
BUN: 23 mg/dL — ABNORMAL HIGH (ref 6–20)
CO2: 27 mmol/L (ref 22–32)
Calcium: 7.6 mg/dL — ABNORMAL LOW (ref 8.9–10.3)
Chloride: 105 mmol/L (ref 98–111)
Creatinine, Ser: 0.96 mg/dL (ref 0.44–1.00)
GFR, Estimated: >60 mL/min (ref >60)
Glucose, Bld: 205 mg/dL — ABNORMAL HIGH (ref 70–99)
Potassium: 4 mmol/L (ref 3.5–5.1)
Sodium: 141 mmol/L (ref 135–145)
Total Bilirubin: 0.6 mg/dL (ref 0.3–1.2)
Total Protein: 4.8 g/dL — ABNORMAL LOW (ref 6.5–8.1)

## 2022-12-03 LAB — GLUCOSE, CAPILLARY
Glucose-Capillary: 195 mg/dL — ABNORMAL HIGH (ref 70–99)
Glucose-Capillary: 161 mg/dL — ABNORMAL HIGH (ref 70–99)
Glucose-Capillary: 223 mg/dL — ABNORMAL HIGH (ref 70–99)
Glucose-Capillary: 165 mg/dL — ABNORMAL HIGH (ref 70–99)
Glucose-Capillary: 132 mg/dL — ABNORMAL HIGH (ref 70–99)

## 2022-12-03 LAB — CBC
HCT: 21.3 % — ABNORMAL LOW (ref 36.0–46.0)
Hemoglobin: 7.3 g/dL — ABNORMAL LOW (ref 12.0–15.0)
MCH: 28.5 pg (ref 26.0–34.0)
MCHC: 34.3 g/dL (ref 30.0–36.0)
MCV: 83.2 fL (ref 80.0–100.0)
Platelets: 54 10*3/uL — ABNORMAL LOW (ref 150–400)
RBC: 2.56 MIL/uL — ABNORMAL LOW (ref 3.87–5.11)
RDW: 17.1 % — ABNORMAL HIGH (ref 11.5–15.5)
WBC: 3.3 10*3/uL — ABNORMAL LOW (ref 4.0–10.5)
nRBC: 0 % (ref 0.0–0.2)

## 2022-12-03 LAB — AMMONIA: Ammonia: 18 umol/L (ref 9–35)

## 2022-12-03 LAB — PHOSPHORUS
Phosphorus: 2.7 mg/dL (ref 2.5–4.6)
Phosphorus: 2 mg/dL — ABNORMAL LOW (ref 2.5–4.6)

## 2022-12-03 LAB — MAGNESIUM
Magnesium: 2.3 mg/dL (ref 1.7–2.4)
Magnesium: 2.1 mg/dL (ref 1.7–2.4)

## 2022-12-03 MED ORDER — OXYCODONE HCL 5 MG PO TABS
5.0000 mg | ORAL_TABLET | Freq: Three times a day (TID) | ORAL | Status: DC | PRN
  Administered 2022-12-03 – 2022-12-07 (×8): 5 mg
  Filled 2022-12-03 (×8): qty 1

## 2022-12-03 MED ORDER — INSULIN GLARGINE-YFGN 100 UNIT/ML ~~LOC~~ SOLN
5.0000 [IU] | Freq: Every day | SUBCUTANEOUS | Status: DC
  Administered 2022-12-03 – 2022-12-06 (×4): 5 [IU] via SUBCUTANEOUS
  Filled 2022-12-03 (×4): qty 0.05

## 2022-12-03 MED ORDER — POLYETHYLENE GLYCOL 3350 17 G PO PACK
17.0000 g | PACK | Freq: Every day | ORAL | Status: DC
  Administered 2022-12-05: 17 g
  Filled 2022-12-03 (×4): qty 1

## 2022-12-03 MED ORDER — GERHARDT'S BUTT CREAM
TOPICAL_CREAM | CUTANEOUS | Status: DC | PRN
  Filled 2022-12-03: qty 1

## 2022-12-03 MED ORDER — VITAL AF 1.2 CAL PO LIQD
1000.0000 mL | ORAL | Status: DC
  Administered 2022-12-04 – 2022-12-05 (×2): 1000 mL
  Filled 2022-12-03 (×3): qty 1000

## 2022-12-03 NOTE — Progress Notes (Addendum)
NAME:  [name], MRN:  [id number], DOB:  11/14/1985, LOS: 5 ADMISSION DATE:  11/27/2022, CONSULTATION DATE:  11/28/22 REFERRING MD:  Marlon Pel , CHIEF COMPLAINT:  ams   History of Present Illness:  37 yo woman with hx of DM1, HFrEF, HTN, anxiety/depression, cocaine use, DKA, presenting with DKA and ARDS.  Pertinent  Medical History  Home lantus 74m daily  Mod sliding scale insulin  Augmentin for pna (on d/c 2/1 ) x 3 days   Significant Hospital Events: Including procedures, antibiotic start and stop dates in addition to other pertinent events   2/12 admit, intubation, proning 2/13 vomited proned, supinated, another emesis episode later, PF ratio improving 2/14 cortrak post plyroic, trickle feeds, diuresing, weaning PEEP, trach asp c/w MSSA  2/16 Weaning to Extubation, remove OG, continue to keep cortrak for feeds 2/17 Requiring HBaradaafter extubation  Interim History / Subjective:  HHFNC, up to chair yesterday  Objective   Blood pressure 117/69, pulse 66, temperature 99.1 F (37.3 C), temperature source Oral, resp. rate 14, height 5' (1.524 m), weight 44.9 kg, SpO2 93 %.    Vent Mode: PSV;CPAP FiO2 (%):  [40 %-75 %] 75 % PEEP:  [5 cmH20] 5 cmH20 Pressure Support:  [5 cmH20] 5 cmH20   Intake/Output Summary (Last 24 hours) at 12/03/2022 0725 Last data filed at 12/03/2022 0700 Gross per 24 hour  Intake 1977.82 ml  Output 3331 ml  Net -1353.18 ml    Filed Weights   12/01/22 0500 12/02/22 0500 12/03/22 0412  Weight: 43.5 kg 45.7 kg 44.9 kg    Examination: General: malnourished, acutely ill female lying in ICU bed  HEENT: MM pink/moist, cortrak for feeding, HHFNC for oxygenation, noticeable cerumen drainage right ear Neuro: Alert, following commands with ease  CV: s1 and s2 auscultated, no murmurs, gallops, rubs PULM: Clear and diminished BL breath sounds, clear upper lung fields and diminished lower lung fields GI: Active BS in all quadrants, Cortrak, receiving  feeding via cortrak  Extremities: 1+ generalized edema throughout upper and lower extremities, able to move all extremities     Resolved Hospital Problem list     Assessment & Plan:   Shock resolving, septic vs vasoplegia due to malnutrition  Vasopressin stopped 2/16  MSSA PNA Fungal BAL showing Candida Albicans-low suspicion for  Cause of PNA  Remains off Vasopressors, BP stable  P:  ancef for MSSA PNA, continue 7 day course [place] Goal of > or equal to 60  Patient started on midodrine 270mTID  Continue to follow CVPs Q4  Monitor for development of thrush    Acute hypoxemic and hypercarbic resp failure ARDS, resolving MSSA PNA Patient weaning, follow commands, no labored or increased work of breathing during weaning Precedex off  P: HHFNC 75%, 35L  Mobilize to chair, aggressive pulmonary hygiene, Encourage cough and deep breathing and IS  Thrombocytopenia  Normocytic anemia  - given recent hospitalization (discharged 2/1) w/ ppx heparin exposure,  2/14 HIT ab panel negtive  2/17 Platelets 54 from 25  Hgb 7.3 from 8.8  No obvious signs of bleeding  P: Continue to follow CBC-morning labs  Monitor for signs of bleeding  Acute kidney injury resolving in the setting of Anasarca  Creatinine 0.81 from 1.05  P: Continue to Trend BMP / urinary output Continue to Replace electrolytes if needed  Continue to Avoid nephrotoxic agents, ensure adequate renal perfusion  Acute metabolic encephalopathy- related to ARDS, DKA, sepsis  2/15 ammonia level 42  Klonopin 0.69m40mer  tube  P:  Delirium precautions  keep lights on during day, off during night  Work on mobilizing after extubation/PT and OT consult placed   Severe protein calorie malnutrition Likely component of gastroparesis 2/2 poorly controlled DM High risk for refeeding syndrome/ electrolyte imbalances  2/14> advance as tolerated per RD recs, monitor electrolytes closely  P: Advance tube feeds as tolerated,  keep cortrak a  Gastroparesis in setting of DM2  Patient having N/V on 2/16 P:  Continue reglan   hyperglycemia  2/16 Glucose on BMP 139  P: Continue SSI and long acting levemir 67m BID Continue tube feeding via cortrak    Transaminitis, resolving Suspecting related to shock  2/17  AST 34 from 28  ALT 47 from 98 - ammonia 18 P: Continue to follow liver function panels    Best Practice (right click and "Reselect all SmartList Selections" daily)   Diet/type: Trickle Tube feedings  DVT prophylaxis: SCD GI prophylaxis: PPI Lines: Central line and Arterial Line Foley:  Purewick  Code Status:  full code Last date of multidisciplinary goals of care discussion [ will update family 2/16, currently no family at bedside   CRITICAL CARE Performed by: JWaldo LaineBSN, RN, S-ACNP    Total critical care time: 40 minutes  Critical care time was exclusive of separately billable procedures and treating other patients.  Critical care was necessary to treat or prevent imminent or life-threatening deterioration.  Critical care was time spent personally by me on the following activities: development of treatment plan with patient and/or surrogate as well as nursing, discussions with consultants, evaluation of patient's response to treatment, examination of patient, obtaining history from patient or surrogate, ordering and performing treatments and interventions, ordering and review of laboratory studies, ordering and review of radiographic studies, pulse oximetry and re-evaluation of patient's condition.    JWaldo LaineBSN, RN, S-ACNP   See Amion for pager If no response to pager, please call PCCM consult pager After 7:00 pm call Elink

## 2022-12-03 NOTE — Evaluation (Signed)
Occupational Therapy Evaluation Patient Details Name: [name] MRN: [id number] DOB: 1986-02-26 Today's Date: 12/03/2022   History of Present Illness 37 yo female admitted 2/11 with DKA with ARDS and intubation 2/12-2/16. PMHx: DM, HFrEF, HTN, anxiety, cocaine abuse   Clinical Impression   Pt admitted for concerns listed above. PTA pt reported that she was independent prior to admission with all ADL's and IADL's. At this time she is requiring increased assist for all ADL's and min A for mobility. She fatigues quickly and is unable to tolerate activities for more than 2-3 mins before requiring a long seated break. As she continues to stabilize medically, anticipate she will need AIR to maximize her strength and independence prior to returning home. OT will continue to follow acutely.      Recommendations for follow up therapy are one component of a multi-disciplinary discharge planning process, led by the attending physician.  Recommendations may be updated based on patient status, additional functional criteria and insurance authorization.   Follow Up Recommendations  Acute inpatient rehab (3hours/day)     Assistance Recommended at Discharge Frequent or constant Supervision/Assistance  Patient can return home with the following A lot of help with walking and/or transfers;A lot of help with bathing/dressing/bathroom;Assistance with cooking/housework;Assistance with feeding;Help with stairs or ramp for entrance;Direct supervision/assist for medications management    Functional Status Assessment  Patient has had a recent decline in their functional status and demonstrates the ability to make significant improvements in function in a reasonable and predictable amount of time.  Equipment Recommendations  Other (comment) (TBD)    Recommendations for Other Services Rehab consult     Precautions / Restrictions Precautions Precautions: Fall;Other (comment) Precaution Comments: watch  sats, cortrak Restrictions Weight Bearing Restrictions: No      Mobility Bed Mobility Overal bed mobility: Needs Assistance Bed Mobility: Supine to Sit     Supine to sit: HOB elevated, Mod assist     General bed mobility comments: Mod A to pull up on OT    Transfers Overall transfer level: Needs assistance Equipment used: 1 person hand held assist Transfers: Sit to/from Stand, Bed to chair/wheelchair/BSC Sit to Stand: Min assist     Step pivot transfers: Min assist     General transfer comment: Min A with UE support and increased time and work of breath.      Balance Overall balance assessment: Needs assistance Sitting-balance support: Feet supported, No upper extremity supported Sitting balance-Leahy Scale: Good     Standing balance support: Single extremity supported Standing balance-Leahy Scale: Fair Standing balance comment: HHA in standing and stepping                           ADL either performed or assessed with clinical judgement   ADL Overall ADL's : Needs assistance/impaired Eating/Feeding: NPO   Grooming: Set up;Minimal assistance Grooming Details (indicate cue type and reason): Initially pt could start with set up, however quickly fatigueing requiring min A Upper Body Bathing: Minimal assistance;Sitting   Lower Body Bathing: Moderate assistance;Sitting/lateral leans   Upper Body Dressing : Minimal assistance;Sitting   Lower Body Dressing: Maximal assistance;Sitting/lateral leans   Toilet Transfer: Minimal assistance;Stand-pivot   Toileting- Clothing Manipulation and Hygiene: Moderate assistance;Sitting/lateral lean;Sit to/from stand       Functional mobility during ADLs: Minimal assistance General ADL Comments: Pt requiring constant hands on assist for all ADL's, mainly due to weakness and fatigue     Vision Baseline Vision/History: 0 No  visual deficits Ability to See in Adequate Light: 0 Adequate Patient Visual Report: No  change from baseline Vision Assessment?: No apparent visual deficits     Perception Perception Perception Tested?: No   Praxis Praxis Praxis tested?: Not tested    Pertinent Vitals/Pain Pain Assessment Pain Assessment: 0-10 Pain Score: 5  Pain Location: back Pain Descriptors / Indicators: Aching Pain Intervention(s): Limited activity within patient's tolerance, Monitored during session, Repositioned     Hand Dominance Right   Extremity/Trunk Assessment Upper Extremity Assessment Upper Extremity Assessment: Generalized weakness   Lower Extremity Assessment Lower Extremity Assessment: Generalized weakness   Cervical / Trunk Assessment Cervical / Trunk Assessment: Normal   Communication Communication Communication: HOH   Cognition Arousal/Alertness: Awake/alert Behavior During Therapy: Flat affect Overall Cognitive Status: Within Functional Limits for tasks assessed                                 General Comments: Pt with poor hearing at this time, able to follow all commands and directions., Sister reports she is at baseline.     General Comments  VSS on HHF, respitory in room at end of session changing settings    Exercises     Shoulder Instructions      Home Living Family/patient expects to be discharged to:: Private residence Living Arrangements: Spouse/significant other;Children Available Help at Discharge: Family;Available 24 hours/day Type of Home: House Home Access: Stairs to enter [employer] of Steps: 2 Entrance Stairs-Rails: Right;Left Home Layout: One level     Bathroom Shower/Tub: Teacher, early years/pre: Standard     Home Equipment: None          Prior Functioning/Environment Prior Level of Function : Independent/Modified Independent;Driving                        OT Problem List: Decreased strength;Decreased activity tolerance;Impaired balance (sitting and/or standing);Decreased  cognition;Decreased safety awareness;Cardiopulmonary status limiting activity      OT Treatment/Interventions: Self-care/ADL training;Therapeutic exercise;Energy conservation;DME and/or AE instruction;Therapeutic activities;Cognitive remediation/compensation;Patient/family education;Balance training    OT Goals(Current goals can be found in the care plan section) Acute Rehab OT Goals Patient Stated Goal: Sister OT Goal Formulation: With patient/family Time For Goal Achievement: 12/17/22 Potential to Achieve Goals: Good ADL Goals Pt Will Perform Grooming: with modified independence;sitting Pt Will Perform Lower Body Bathing: with min guard assist;sitting/lateral leans;sit to/from stand Pt Will Perform Lower Body Dressing: with min guard assist;sitting/lateral leans;sit to/from stand Pt Will Transfer to Toilet: with min guard assist;ambulating Pt Will Perform Toileting - Clothing Manipulation and hygiene: with min guard assist;sitting/lateral leans;sit to/from stand  OT Frequency: Min 2X/week    Co-evaluation              AM-PAC OT "6 Clicks" Daily Activity     Outcome Measure Help from another person eating meals?: Total Help from another person taking care of personal grooming?: A Little Help from another person toileting, which includes using toliet, bedpan, or urinal?: A Lot Help from another person bathing (including washing, rinsing, drying)?: A Lot Help from another person to put on and taking off regular upper body clothing?: A Little Help from another person to put on and taking off regular lower body clothing?: A Lot 6 Click Score: 13   End of Session Equipment Utilized During Treatment: Oxygen Nurse Communication: Mobility status  Activity Tolerance: Patient tolerated treatment well Patient left: in  chair;with call bell/phone within reach;with family/visitor present  OT Visit Diagnosis: Unsteadiness on feet (R26.81);Other abnormalities of gait and mobility  (R26.89);Muscle weakness (generalized) (M62.81)                Time: [id number] OT Time Calculation (min): 20 min Charges:  OT General Charges $OT Visit: 1 Visit OT Evaluation $OT Eval Moderate Complexity: [place], OTR/L [place] Acute Rehab  Jizelle Conkey Elane Yolanda Bonine 12/03/2022, 10:01 AM

## 2022-12-03 NOTE — Progress Notes (Signed)
Patient placed on BIPAP per order, patient currently tolerating well. RT will continue to monitor.

## 2022-12-03 NOTE — Progress Notes (Signed)
Pt appears to be "mouth breathing" with heated high flow. Patient desats down in the high 80's. RN concerned for night time use with possible worsening desaturation. Respiratory paged in regards to possible CPAP/BiPAP at night. MD notified of concern.

## 2022-12-03 NOTE — Evaluation (Signed)
Clinical/Bedside Swallow Evaluation Patient Details  Name: [name] MRN: [id number] Date of Birth: 1985-11-26  Today's Date: 12/03/2022 Time: SLP Start Time (ACUTE ONLY): [id number] SLP Stop Time (ACUTE ONLY): 0830 SLP Time Calculation (min) (ACUTE ONLY): 16 min  Past Medical History:  Past Medical History:  Diagnosis Date   Acid reflux 2010   CHF (congestive heart failure) ([place])    Diabetes mellitus    DKA 05/01/2011   Hypertension    Polysubstance abuse ([place])    Past Surgical History:  Past Surgical History:  Procedure Laterality Date   INCISION AND DRAINAGE ABSCESS Right 05/30/2022   Procedure: INCISION AND DRAINAGE ABSCESS;  Surgeon: Ronny Bacon, MD;  Location: [facility] ORS;  Service: General;  Laterality: Right;  Buttock/perirectal abscess   TUBAL LIGATION     WOUND EXPLORATION Left 07/26/2014   Procedure: Irrigation, debridement and exploration of elbow wounds;  Surgeon: Leanora Cover, MD;  Location: [place];  Service: Orthopedics;  Laterality: Left;   HPI:  37 yo female admitted 2/11 with DKA with ARDS and intubation 2/12-2/16. PMHx: DM, HFrEF, HTN, anxiety, cocaine abuse. Chest x ray 2/17 revealed diffuse interstitial and airspace opacities in the lungs  bilaterally, increased from the prior exam.    Assessment / Plan / Recommendation  Clinical Impression  Pt presents with s/sx of suspected component of pharyngeal dysphagia following recent intubation. Vocal quality noted to be weaker, pt on increased O2 support with worsening chest x ray (bilateral opacties) per chart review. Nurse reports pt fatigues frequently. Pt with no primary hx of dysphagia. She does were dentures at baseline (family to bring). Assessed with ice chips, water, puree. Pt with isolated coughing (delayed following ice chip x1) and immediate cough triggered following thin liquids via straw concerning for reduced airway protection. No overt s/sx of aspiration with puree. Deferred solid trials this date.  Recommend continue NPO with single ice chips following oral care as tolerated and PO meds in puree (crush larger as needed). Per discussion with RN, pt very weak, would not be ideal candidate for instrumental swallow study this date due to decompensated respiratory status. Will follow up subsequent date for PO trials and or instrumental swallow study as pt clinically able to tolerate. SLP Visit Diagnosis: Dysphagia, unspecified (R13.10)    Aspiration Risk  Moderate aspiration risk    Diet Recommendation   NPO; okay for single ice chips following oral care as tolerated  Medication Administration: Whole meds with puree    Other  Recommendations Oral Care Recommendations: Oral care QID;Oral care prior to ice chip/H20    Recommendations for follow up therapy are one component of a multi-disciplinary discharge planning process, led by the attending physician.  Recommendations may be updated based on patient status, additional functional criteria and insurance authorization.  Follow up Recommendations  (TBD)      Assistance Recommended at Discharge    Functional Status Assessment Patient has had a recent decline in their functional status and demonstrates the ability to make significant improvements in function in a reasonable and predictable amount of time.  Frequency and Duration min 2x/week          Prognosis Prognosis for improved oropharyngeal function: Good Barriers to Reach Goals: Time post onset;Severity of deficits      Swallow Study   General Date of Onset: 11/28/22 HPI: 37 yo female admitted 2/11 with DKA with ARDS and intubation 2/12-2/16. PMHx: DM, HFrEF, HTN, anxiety, cocaine abuse. Chest x ray 2/17 revealed diffuse interstitial and airspace opacities in  the lungs  bilaterally, increased from the prior exam. Type of Study: Bedside Swallow Evaluation Previous Swallow Assessment: BSE 2022 grossly functional when mentation was back to baseline Diet Prior to this Study:  NPO Temperature Spikes Noted: No Respiratory Status:  (HFNC) History of Recent Intubation: Yes Total duration of intubation (days): 4 days Date extubated: 12/02/22 Behavior/Cognition: Cooperative;Alert Oral Cavity Assessment: Within Functional Limits Oral Care Completed by SLP: Yes Oral Cavity - Dentition: Edentulous;Dentures, not available (family to bring dentures when able) Vision: Functional for self-feeding Self-Feeding Abilities: Needs assist Patient Positioning: Upright in bed Baseline Vocal Quality: Low vocal intensity Volitional Cough: Strong Volitional Swallow: Able to elicit    Oral/Motor/Sensory Function Overall Oral Motor/Sensory Function: Generalized oral weakness   Ice Chips Ice chips: Impaired Presentation: Spoon Oral Phase Functional Implications: Prolonged oral transit Pharyngeal Phase Impairments: Suspected delayed Swallow;Multiple swallows;Decreased hyoid-laryngeal movement;Cough - Delayed   Thin Liquid Thin Liquid: Impaired Presentation: Cup;Straw Oral Phase Impairments: Reduced labial seal Oral Phase Functional Implications: Prolonged oral transit Pharyngeal  Phase Impairments: Suspected delayed Swallow;Multiple swallows;Cough - Delayed;Cough - Immediate    Nectar Thick Nectar Thick Liquid: Not tested   Honey Thick Honey Thick Liquid: Not tested   Puree Puree: Within functional limits Presentation: Spoon   Solid     Solid: Not tested     Hayden Rasmussen MA, CCC-SLP Acute Rehabilitation Services   12/03/2022,8:39 AM

## 2022-12-04 DIAGNOSIS — R7401 Elevation of levels of liver transaminase levels: Secondary | ICD-10-CM | POA: Diagnosis not present

## 2022-12-04 DIAGNOSIS — R6521 Severe sepsis with septic shock: Secondary | ICD-10-CM

## 2022-12-04 DIAGNOSIS — J15211 Pneumonia due to Methicillin susceptible Staphylococcus aureus: Secondary | ICD-10-CM | POA: Diagnosis not present

## 2022-12-04 DIAGNOSIS — A419 Sepsis, unspecified organism: Secondary | ICD-10-CM

## 2022-12-04 DIAGNOSIS — E1010 Type 1 diabetes mellitus with ketoacidosis without coma: Secondary | ICD-10-CM | POA: Diagnosis not present

## 2022-12-04 LAB — CBC
HCT: 20.6 % — ABNORMAL LOW (ref 36.0–46.0)
Hemoglobin: 7 g/dL — ABNORMAL LOW (ref 12.0–15.0)
MCH: 29.2 pg (ref 26.0–34.0)
MCHC: 34 g/dL (ref 30.0–36.0)
MCV: 85.8 fL (ref 80.0–100.0)
Platelets: UNDETERMINED 10*3/uL (ref 150–400)
RBC: 2.4 MIL/uL — ABNORMAL LOW (ref 3.87–5.11)
RDW: 17.6 % — ABNORMAL HIGH (ref 11.5–15.5)
WBC: 5.5 10*3/uL (ref 4.0–10.5)
nRBC: 0 % (ref 0.0–0.2)

## 2022-12-04 LAB — GLUCOSE, CAPILLARY
Glucose-Capillary: 165 mg/dL — ABNORMAL HIGH (ref 70–99)
Glucose-Capillary: 270 mg/dL — ABNORMAL HIGH (ref 70–99)
Glucose-Capillary: 219 mg/dL — ABNORMAL HIGH (ref 70–99)
Glucose-Capillary: 300 mg/dL — ABNORMAL HIGH (ref 70–99)
Glucose-Capillary: 285 mg/dL — ABNORMAL HIGH (ref 70–99)
Glucose-Capillary: 170 mg/dL — ABNORMAL HIGH (ref 70–99)

## 2022-12-04 LAB — BASIC METABOLIC PANEL
Anion gap: 8 (ref 5–15)
BUN: 23 mg/dL — ABNORMAL HIGH (ref 6–20)
CO2: 29 mmol/L (ref 22–32)
Calcium: 7.6 mg/dL — ABNORMAL LOW (ref 8.9–10.3)
Chloride: 103 mmol/L (ref 98–111)
Creatinine, Ser: 0.86 mg/dL (ref 0.44–1.00)
GFR, Estimated: >60 mL/min (ref >60)
Glucose, Bld: 176 mg/dL — ABNORMAL HIGH (ref 70–99)
Potassium: 3.6 mmol/L (ref 3.5–5.1)
Sodium: 140 mmol/L (ref 135–145)

## 2022-12-04 MED ORDER — POTASSIUM PHOSPHATES 15 MMOLE/5ML IV SOLN
30.0000 mmol | Freq: Once | INTRAVENOUS | Status: AC
  Administered 2022-12-04: 30 mmol via INTRAVENOUS
  Filled 2022-12-04: qty 10

## 2022-12-04 MED ORDER — SODIUM CHLORIDE 0.9% FLUSH: 10.0000 mL | INTRAVENOUS | Status: DC | PRN

## 2022-12-04 MED ORDER — FUROSEMIDE 10 MG/ML IJ SOLN
20.0000 mg | Freq: Once | INTRAMUSCULAR | Status: AC
  Administered 2022-12-04: 20 mg via INTRAVENOUS
  Filled 2022-12-04: qty 2

## 2022-12-04 NOTE — Progress Notes (Signed)
Pt requesting to come off BIPAP. Pt placed on HFNC 30L/85%. Pt tol well. NAD. Sat 99%

## 2022-12-04 NOTE — Progress Notes (Signed)
PROGRESS NOTE        PATIENT DETAILS Name: [name] Age: 37 y.o. Sex: female Date of Birth: 1985-12-17 Admit Date: 11/27/2022 Admitting Physician Collier Bullock, MD [id number], [facility]  Brief Summary: Patient is a 37 y.o.  female with history of DM-1, HFrEF, HTN, mood disorder, cocaine use-presenting with septic shock, acute hypoxic respiratory failure due to MSSA bacteremia and DKA.  Significant events: 2/12>> admit to ICU-intubated/proning 2/16>> extubated 2/18>> transferred to [facility]  Significant studies: 2/11>> CXR: Left> right airspace disease 2/12>> RUQ ultrasound: Gallbladder sludge/pericholecystic fluid 2/12>> echo: EF> 75% 2/17>> CXR: Diffuse interstitial/airspace opacities bilaterally  Significant microbiology data: 1/27>> blood culture: Negative 1/28>> blood culture: Negative 2/11>> COVID/influenza/RSV PCR: Negative 2/12>> respiratory virus panel: Negative 2/12>> BAL: MSSA, rare Candida albicans  Procedures: 2/12-2/16>> ETT 2/12>> bronchoscopy 2/12>> central venous access by PCCM  Consults: PCCM  Subjective: Febrile last night-on heated high flow-comfortable this morning.  Claims some hearing loss out of right ear-post extubation-on Debrox..  Objective: Vitals: Blood pressure 125/75, pulse 77, temperature 97.6 F (36.4 C), temperature source Axillary, resp. rate 18, height 5' (1.524 m), weight 44.9 kg, SpO2 97 %.   Exam: Gen Exam:Alert awake-not in any distress HEENT:atraumatic, normocephalic Chest: B/L clear to auscultation anteriorly CVS:S1S2 regular Abdomen:soft non tender, non distended Extremities:no edema Neurology: Non focal Skin: no rash  Pertinent Labs/Radiology:    Latest Ref Rng & Units 12/03/2022    3:47 AM 12/02/2022    2:21 PM 12/02/2022    4:04 AM  CBC  WBC 4.0 - 10.5 K/uL 3.3   5.4   Hemoglobin 12.0 - 15.0 g/dL 7.3  8.8  7.2   Hematocrit 36.0 - 46.0 % 21.3  26.0  21.1   Platelets 150 -  400 K/uL 54   25     Lab Results  Component Value Date   NA 140 12/04/2022   K 3.6 12/04/2022   CL 103 12/04/2022   CO2 29 12/04/2022      Assessment/Plan: Septic shock secondary to MSSA PNA Sepsis physiology improved Remains on midodrine Ancef x 7-10 days Febrile last night-will remove subclavian TLC today. If fever continues-needs repeat blood cultures.  Acute hypoxic/hypercarbic respiratory failure Secondary to MSSA PNA with ARDS Extubated 2/16 On HFNC Continue pulmonary toileting/mobilization Wean down FiO2 as tolerated  Acute metabolic encephalopathy 2/2 sepsis/DKA/hypoxia/AKI Improved-completely awake/alert this am Supportive care Delirium precautions  AKI Hemodynamically mediated Improving Avoid nephrotoxic agents as much as possible  DKA Resolved  DM 1 (A1c> 15.5 on 1/30) with uncontrolled hyperglycemia CBG stable Continue Semglee 5 units daily and SSI Poor long-term control-significantly elevated A1c   Recent Labs    12/03/22 1543 12/03/22 1652 12/04/22 0353  GLUCAP 165* 132* 165*     Diabetic gastroparesis Continue Reglan  Transaminitis 2/2 shock liver Improving  Thrombocytopenia Likely secondary to sepsis HIT Ab neg on 2/14 Improving Trend CBC  Normocytic anemia 2/2 critical illness Transfuse if Hb<7  GERD PPI  Oropharyngeal dysphagia Due to severe debility in the setting of critical illness Continue cortrak feeds Advance diet per SLP Watch for refeeding syndrome  Nutrition Status: Nutrition Problem: Severe Malnutrition Etiology: chronic illness (uncontrolled T1DM (hemoglobin A1C >15.5), HFrEF) Signs/Symptoms: severe fat depletion, severe muscle depletion, percent weight loss (18.9% weight loss in < 2 months) Percent weight loss: 18.9 % Interventions: Tube feeding, Refer to RD note for recommendations  Pressure Ulcer: Pressure Injury 11/30/22 Coccyx Medial Stage 2 -  Partial thickness loss of dermis presenting as a  shallow open injury with a red, pink wound bed without slough. (Active)  11/30/22 0800  Location: Coccyx  Location Orientation: Medial  Staging: Stage 2 -  Partial thickness loss of dermis presenting as a shallow open injury with a red, pink wound bed without slough.  Wound Description (Comments):   Present on Admission:   Dressing Type Foam - Lift dressing to assess site every shift 12/03/22 2000     Pressure Injury 11/30/22 Buttocks Right Stage 2 -  Partial thickness loss of dermis presenting as a shallow open injury with a red, pink wound bed without slough. (Active)  11/30/22 0800  Location: Buttocks  Location Orientation: Right  Staging: Stage 2 -  Partial thickness loss of dermis presenting as a shallow open injury with a red, pink wound bed without slough.  Wound Description (Comments):   Present on Admission:   Dressing Type None 12/03/22 1257    Underweight: Estimated body mass index is 19.33 kg/m as calculated from the following:   Height as of this encounter: 5' (1.524 m).   Weight as of this encounter: 44.9 kg.   Code status:   Code Status: Full Code   DVT Prophylaxis: SCDs Start: 11/28/22 0009 Pharmacological prophylaxis on hold due to thrombocytopenia  Family Communication: Daughter at bedside   Disposition Plan: Status is: Inpatient Remains inpatient appropriate because: Severity of illness   Planned Discharge Destination:Home   Diet: Diet Order             Diet NPO time specified Except for: Ice Chips  Diet effective now                     Antimicrobial agents: Anti-infectives (From admission, onward)    Start     Dose/Rate Route Frequency Ordered Stop   11/30/22 1415  ceFAZolin (ANCEF) IVPB 2g/100 mL premix        2 g 200 mL/hr over 30 Minutes Intravenous Every 8 hours 11/30/22 1323 12/06/22 2359   11/29/22 1345  micafungin (MYCAMINE) 100 mg in sodium chloride 0.9 % 100 mL IVPB  Status:  Discontinued       See Hyperspace for full  Linked Orders Report.   100 mg 105 mL/hr over 1 Hours Intravenous Every 24 hours 11/28/22 1257 11/28/22 1320   11/29/22 1200  vancomycin (VANCOCIN) 500 mg in sodium chloride 0.9 % 100 mL IVPB  Status:  Discontinued        500 mg 100 mL/hr over 60 Minutes Intravenous Every 24 hours 11/28/22 1138 11/29/22 0048   11/29/22 1200  vancomycin (VANCOREADY) IVPB 500 mg/100 mL  Status:  Discontinued        500 mg 100 mL/hr over 60 Minutes Intravenous Every 24 hours 11/29/22 0048 11/30/22 1323   11/29/22 0000  ceFEPIme (MAXIPIME) 2 g in sodium chloride 0.9 % 100 mL IVPB  Status:  Discontinued        2 g 200 mL/hr over 30 Minutes Intravenous Every 12 hours 11/28/22 1138 11/30/22 1323   11/28/22 2200  cefTRIAXone (ROCEPHIN) 1 g in sodium chloride 0.9 % 100 mL IVPB  Status:  Discontinued        1 g 200 mL/hr over 30 Minutes Intravenous Every 24 hours 11/28/22 0134 11/28/22 0741   11/28/22 2200  cefTRIAXone (ROCEPHIN) 2 g in sodium chloride 0.9 % 100 mL IVPB  Status:  Discontinued  2 g 200 mL/hr over 30 Minutes Intravenous Every 24 hours 11/28/22 0741 11/28/22 1059   11/28/22 2200  azithromycin (ZITHROMAX) 500 mg in sodium chloride 0.9 % 250 mL IVPB  Status:  Discontinued        500 mg 250 mL/hr over 60 Minutes Intravenous Every 24 hours 11/28/22 0741 11/28/22 1059   11/28/22 1345  micafungin (MYCAMINE) 200 mg in sodium chloride 0.9 % 100 mL IVPB  Status:  Discontinued       See Hyperspace for full Linked Orders Report.   200 mg 110 mL/hr over 1 Hours Intravenous Every 24 hours 11/28/22 1257 11/28/22 1320   11/28/22 1145  vancomycin (VANCOCIN) IVPB 1000 mg/200 mL premix        1,000 mg 200 mL/hr over 60 Minutes Intravenous  Once 11/28/22 1059 11/28/22 1417   11/28/22 1145  ceFEPIme (MAXIPIME) 2 g in sodium chloride 0.9 % 100 mL IVPB        2 g 200 mL/hr over 30 Minutes Intravenous  Once 11/28/22 1059 11/28/22 1248   11/28/22 0000  azithromycin (ZITHROMAX) 500 mg in sodium chloride 0.9 % 250 mL  IVPB  Status:  Discontinued        500 mg 250 mL/hr over 60 Minutes Intravenous Every 24 hours 11/27/22 2349 11/28/22 0741   11/28/22 0000  cefTRIAXone (ROCEPHIN) 1 g in sodium chloride 0.9 % 100 mL IVPB        1 g 200 mL/hr over 30 Minutes Intravenous  Once 11/27/22 2349 11/28/22 0037        MEDICATIONS: Scheduled Meds:  carbamide peroxide  5 drop Right EAR BID   Chlorhexidine Gluconate Cloth  6 each Topical Daily   feeding supplement (PROSource TF20)  60 mL Per Tube TID   folic acid  1 mg Per Tube Daily   insulin aspart  0-6 Units Subcutaneous Q4H   insulin glargine-yfgn  5 Units Subcutaneous Daily   metoCLOPramide (REGLAN) injection  5 mg Intravenous Q8H   midodrine  20 mg Per Tube Q8H   mouth rinse  15 mL Mouth Rinse 4 times per day   pantoprazole (PROTONIX) IV  40 mg Intravenous Daily   polyethylene glycol  17 g Per Tube Daily   thiamine  100 mg Per Tube Daily   Continuous Infusions:  sodium chloride 10 mL/hr at 12/03/22 1200    ceFAZolin (ANCEF) IV 2 g (12/04/22 0546)   dextrose 30 mL/hr at 12/03/22 1200   feeding supplement (VITAL AF 1.2 CAL) 1,000 mL (12/04/22 0415)   PRN Meds:.Place/Maintain arterial line **AND** sodium chloride, acetaminophen, dextrose, Gerhardt's butt cream, mouth rinse, oxyCODONE, sodium chloride flush   I have personally reviewed following labs and imaging studies  LABORATORY DATA: CBC: Recent Labs  Lab 11/27/22 2257 11/27/22 2258 11/30/22 0517 11/30/22 0922 11/30/22 1444 11/30/22 1723 12/01/22 0410 12/01/22 0513 12/01/22 1028 12/01/22 1650 12/02/22 0404 12/02/22 1421 12/03/22 0347  WBC 16.6*   < > 26.1*  --   --  18.0* 15.2*  --   --   --  5.4  --  3.3*  NEUTROABS 14.6*  --   --   --   --   --   --   --   --   --   --   --   --   HGB 10.7*   < > 9.2*   < >  --  7.7* 7.8*   < > 8.3* 7.7* 7.2* 8.8* 7.3*  HCT 34.2*   < >  27.4*   < >  --  22.0* 22.3*   < > 23.7* 22.3* 21.1* 26.0* 21.3*  MCV 95.8   < > 83.5  --   --  82.4 82.6  --    --   --  83.7  --  83.2  PLT 152   < > 32*  --  24* 22* 19*  --   --   --  25*  --  54*   < > = values in this interval not displayed.    Basic Metabolic Panel: Recent Labs  Lab 12/01/22 0410 12/01/22 0513 12/01/22 1650 12/02/22 0404 12/02/22 1421 12/02/22 1515 12/03/22 0347 12/03/22 1138 12/03/22 2005 12/04/22 0434  NA 137   < >  --  134* 130*  --  141 140  --  140  K 4.0   < >  --  3.0* 8.2* 4.9 4.0 4.6  --  3.6  CL 101  --   --  102  --   --  105 106  --  103  CO2 25  --   --  25  --   --  27 25  --  29  GLUCOSE 231*  --   --  139*  --   --  205* 240*  --  176*  BUN 30*  --   --  23*  --   --  23* 24*  --  23*  CREATININE 1.05*  --   --  0.81  --   --  0.96 0.85  --  0.86  CALCIUM 7.1*  --   --  6.9*  --   --  7.6* 7.5*  --  7.6*  MG 2.1  --  2.0 1.8  --  2.2 2.3  --  2.1  --   PHOS 4.2  --  2.1* 1.6*  --  4.6 2.7  --  2.0*  --    < > = values in this interval not displayed.    GFR: Estimated Creatinine Clearance: 62.9 mL/min (by C-G formula based on SCr of 0.86 mg/dL).  Liver Function Tests: Recent Labs  Lab 11/29/22 0656 11/30/22 0517 12/01/22 0410 12/02/22 0404 12/03/22 0347  AST 81* 46* 36 28 34  ALT 675* 533* 289* 98* 47*  ALKPHOS 182* 199* 192* 182* 168*  BILITOT 0.3 0.9 1.1 0.5 0.6  PROT 4.4* 4.6* 4.5* 4.4* 4.8*  ALBUMIN 1.6* <1.5* 1.7* 1.7* 1.7*   Recent Labs  Lab 11/28/22 0111  LIPASE 38   Recent Labs  Lab 11/28/22 0706 12/01/22 0930 12/03/22 0347  AMMONIA 62* 42* 18    Coagulation Profile: Recent Labs  Lab 11/30/22 1444  INR 1.2    Cardiac Enzymes: No results for input(s): "CKTOTAL", "CKMB", "CKMBINDEX", "TROPONINI" in the last 168 hours.  BNP (last 3 results) No results for input(s): "PROBNP" in the last 8760 hours.  Lipid Profile: Recent Labs    12/02/22 0404  TRIG 81    Thyroid Function Tests: No results for input(s): "TSH", "T4TOTAL", "FREET4", "T3FREE", "THYROIDAB" in the last 72 hours.  Anemia Panel: No results  for input(s): "VITAMINB12", "FOLATE", "FERRITIN", "TIBC", "IRON", "RETICCTPCT" in the last 72 hours.  Urine analysis:    Component Value Date/Time   COLORURINE YELLOW 11/28/2022 0802   APPEARANCEUR HAZY (A) 11/28/2022 0802   LABSPEC 1.017 11/28/2022 0802   PHURINE 5.0 11/28/2022 0802   GLUCOSEU >=500 (A) 11/28/2022 0802   HGBUR MODERATE (A) 11/28/2022 0802   BILIRUBINUR NEGATIVE 11/28/2022  0802   KETONESUR 20 (A) 11/28/2022 0802   PROTEINUR 30 (A) 11/28/2022 0802   UROBILINOGEN 1.0 07/28/2015 1402   NITRITE NEGATIVE 11/28/2022 0802   LEUKOCYTESUR MODERATE (A) 11/28/2022 0802    Sepsis Labs: Lactic Acid, Venous    Component Value Date/Time   LATICACIDVEN 1.3 12/02/2022 1312    MICROBIOLOGY: Recent Results (from the past 240 hour(s))  Resp panel by RT-PCR (RSV, Flu A&B, Covid) Anterior Nasal Swab     Status: None   Collection Time: 11/27/22 11:16 PM   Specimen: Anterior Nasal Swab  Result Value Ref Range Status   SARS Coronavirus 2 by RT PCR NEGATIVE NEGATIVE Final   Influenza A by PCR NEGATIVE NEGATIVE Final   Influenza B by PCR NEGATIVE NEGATIVE Final    Comment: (NOTE) The Xpert Xpress SARS-CoV-2/FLU/RSV plus assay is intended as an aid in the diagnosis of influenza from Nasopharyngeal swab specimens and should not be used as a sole basis for treatment. Nasal washings and aspirates are unacceptable for Xpert Xpress SARS-CoV-2/FLU/RSV testing.  Fact Sheet for Patients: EntrepreneurPulse.com.au  Fact Sheet for Healthcare Providers: IncredibleEmployment.be  This test is not yet approved or cleared by the [place] FDA and has been authorized for detection and/or diagnosis of SARS-CoV-2 by FDA under an Emergency Use Authorization (EUA). This EUA will remain in effect (meaning this test can be used) for the duration of the COVID-19 declaration under Section 564(b)(1) of the Act, 21 U.S.C. section 360bbb-3(b)(1), unless the  authorization is terminated or revoked.     Resp Syncytial Virus by PCR NEGATIVE NEGATIVE Final    Comment: (NOTE) Fact Sheet for Patients: EntrepreneurPulse.com.au  Fact Sheet for Healthcare Providers: IncredibleEmployment.be  This test is not yet approved or cleared by the [place] FDA and has been authorized for detection and/or diagnosis of SARS-CoV-2 by FDA under an Emergency Use Authorization (EUA). This EUA will remain in effect (meaning this test can be used) for the duration of the COVID-19 declaration under Section 564(b)(1) of the Act, 21 U.S.C. section 360bbb-3(b)(1), unless the authorization is terminated or revoked.  Performed at [facility], [place] [address], [place], [place] [address]   Respiratory (~20 pathogens) panel by PCR     Status: None   Collection Time: 11/28/22 11:24 AM   Specimen: Nasopharyngeal Swab; Respiratory  Result Value Ref Range Status   Adenovirus NOT DETECTED NOT DETECTED Final   Coronavirus 229E NOT DETECTED NOT DETECTED Final    Comment: (NOTE) The Coronavirus on the Respiratory Panel, DOES NOT test for the novel  Coronavirus (2019 nCoV)    Coronavirus HKU1 NOT DETECTED NOT DETECTED Final   Coronavirus NL63 NOT DETECTED NOT DETECTED Final   Coronavirus OC43 NOT DETECTED NOT DETECTED Final   Metapneumovirus NOT DETECTED NOT DETECTED Final   Rhinovirus / Enterovirus NOT DETECTED NOT DETECTED Final   Influenza A NOT DETECTED NOT DETECTED Final   Influenza B NOT DETECTED NOT DETECTED Final   Parainfluenza Virus 1 NOT DETECTED NOT DETECTED Final   Parainfluenza Virus 2 NOT DETECTED NOT DETECTED Final   Parainfluenza Virus 3 NOT DETECTED NOT DETECTED Final   Parainfluenza Virus 4 NOT DETECTED NOT DETECTED Final   Respiratory Syncytial Virus NOT DETECTED NOT DETECTED Final   Bordetella pertussis NOT DETECTED NOT DETECTED Final   Bordetella Parapertussis NOT DETECTED NOT DETECTED Final    Chlamydophila pneumoniae NOT DETECTED NOT DETECTED Final   Mycoplasma pneumoniae NOT DETECTED NOT DETECTED Final    Comment: Performed at [facility]  [facility], [place] [address], [place], [place] [address]  Culture, Respiratory w Gram Stain     Status: None   Collection Time: 11/28/22 11:27 AM   Specimen: Bronchoalveolar Lavage; Respiratory  Result Value Ref Range Status   Specimen Description BRONCHIAL ALVEOLAR LAVAGE  Final   Special Requests NONE  Final   Gram Stain   Final    NO WBC SEEN NO ORGANISMS SEEN Performed at [facility], [address], [place], [place] [address]    Culture FEW STAPHYLOCOCCUS AUREUS RARE CANDIDA ALBICANS   Final   Report Status 12/01/2022 FINAL  Final   Organism ID, Bacteria STAPHYLOCOCCUS AUREUS  Final      Susceptibility   Staphylococcus aureus - MIC*    CIPROFLOXACIN <=0.5 SENSITIVE Sensitive     ERYTHROMYCIN RESISTANT Resistant     GENTAMICIN <=0.5 SENSITIVE Sensitive     OXACILLIN <=0.25 SENSITIVE Sensitive     TETRACYCLINE <=1 SENSITIVE Sensitive     VANCOMYCIN 1 SENSITIVE Sensitive     TRIMETH/SULFA <=10 SENSITIVE Sensitive     CLINDAMYCIN RESISTANT Resistant     RIFAMPIN <=0.5 SENSITIVE Sensitive     Inducible Clindamycin POSITIVE Resistant     * FEW STAPHYLOCOCCUS AUREUS  Fungus Culture With Stain     Status: Abnormal   Collection Time: 11/28/22 11:27 AM   Specimen: Bronchial Alveolar Lavage; Respiratory  Result Value Ref Range Status   Fungus Stain Final report  Final   Fungus (Mycology) Culture Preliminary report (A)  Final    Comment: (NOTE) Performed At: [facility] [address] [place] [place], [place] [id number] Rush Farmer MD [id number]    Fungal Source BRONCHIAL ALVEOLAR LAVAGE  Final    Comment: Performed at [facility], [place] [address] [place], [place] [address]  Aspergillus Ag, BAL/Serum     Status: None   Collection Time: 11/28/22 11:27 AM   Specimen: Bronchial Alveolar Lavage; Respiratory  Result  Value Ref Range Status   Aspergillus Ag, BAL/Serum 0.26 0.00 - 0.49 Index Final    Comment: (NOTE) Performed At: [facility] [place], [place] [id number] Rush Farmer MD [id number]   Fungus Culture Result     Status: None   Collection Time: 11/28/22 11:27 AM  Result Value Ref Range Status   Result 1 Comment  Final    Comment: (NOTE) KOH/Calcofluor preparation:  no fungus observed. Performed At: [facility] [place], [place] [id number] Rush Farmer MD [id number]   Fungal organism reflex     Status: Abnormal   Collection Time: 11/28/22 11:27 AM  Result Value Ref Range Status   Fungal result 1 Candida albicans (A)  Final    Comment: (NOTE) Light growth Performed At: [facility] [place], [place] [id number] Rush Farmer MD [id number]   Resp panel by RT-PCR (RSV, Flu A&B, Covid) Anterior Nasal Swab     Status: None   Collection Time: 11/28/22  4:31 PM   Specimen: Anterior Nasal Swab  Result Value Ref Range Status   SARS Coronavirus 2 by RT PCR NEGATIVE NEGATIVE Final   Influenza A by PCR NEGATIVE NEGATIVE Final   Influenza B by PCR NEGATIVE NEGATIVE Final    Comment: (NOTE) The Xpert Xpress SARS-CoV-2/FLU/RSV plus assay is intended as an aid in the diagnosis of influenza from Nasopharyngeal swab specimens and should not be used as a sole basis for treatment. Nasal washings and aspirates are unacceptable for Xpert Xpress SARS-CoV-2/FLU/RSV testing.  Fact Sheet for Patients: EntrepreneurPulse.com.au  Fact Sheet for Healthcare Providers: IncredibleEmployment.be  This test is not yet approved or cleared by the [place] FDA and has been authorized for detection and/or diagnosis of SARS-CoV-2 by FDA under an Emergency Use Authorization (EUA). This EUA will remain in effect (meaning this test can be used) for the duration of the COVID-19 declaration under  Section 564(b)(1) of the Act, 21 U.S.C. section 360bbb-3(b)(1), unless the authorization is terminated or revoked.     Resp Syncytial Virus by PCR NEGATIVE NEGATIVE Final    Comment: (NOTE) Fact Sheet for Patients: EntrepreneurPulse.com.au  Fact Sheet for Healthcare Providers: IncredibleEmployment.be  This test is not yet approved or cleared by the [place] FDA and has been authorized for detection and/or diagnosis of SARS-CoV-2 by FDA under an Emergency Use Authorization (EUA). This EUA will remain in effect (meaning this test can be used) for the duration of the COVID-19 declaration under Section 564(b)(1) of the Act, 21 U.S.C. section 360bbb-3(b)(1), unless the authorization is terminated or revoked.  Performed at [facility], [place] [address] [place], [place] [address]     RADIOLOGY STUDIES/RESULTS: DG CHEST PORT 1 VIEW  Result Date: 12/03/2022 CLINICAL DATA:  Hypoxia.  Shortness of breath, pneumothorax. EXAM: PORTABLE CHEST 1 VIEW COMPARISON:  11/29/2022. FINDINGS: The heart size and mediastinal contours are within normal limits. Interstitial and diffuse hazy airspace opacities are present in the lungs bilaterally. No effusion or pneumothorax. An enteric tube courses over the left upper quadrant and out of the field of view. There is a left internal jugular central venous catheter which is stable in position. IMPRESSION: Diffuse interstitial and airspace opacities in the lungs bilaterally, increased from the prior exam. Electronically Signed   By: Brett Fairy M.D.   On: 12/03/2022 04:35     LOS: 6 days   Oren Binet, MD  [facility]    To contact the attending provider between 7A-7P or the covering provider during after hours 7P-7A, please log into the web site [email or web address] and access using universal [place] password for that web site. If you do not have the password, please call the hospital  operator.  12/04/2022, 8:21 AM

## 2022-12-04 NOTE — Progress Notes (Addendum)
Speech Language Pathology Treatment: Dysphagia  Patient Details Name: [name] MRN: [id number] DOB: 1986/06/05 Today's Date: 12/04/2022 Time: 1049-1100 SLP Time Calculation (min) (ACUTE ONLY): 11 min  Assessment / Plan / Recommendation Clinical Impression  Pt was seen for dysphagia treatment. She was alert and cooperative during the session. Pt stated that her voice is improved and it appeared to be [facility] without evidence of dysphonia; improvement in vocal fold movement is therefore suspected with an associated reduced aspiration risk. Pt tolerated puree and thin liquids without overt s/s of aspiration. Oral clearance was [facility] and no symptoms of pharyngeal dysphagia noted. Pt was edentulous and reported that she eats advanced solids without them, so trials of more advanced solids were deferred. A full liquid diet is recommended at this time, but pt reported that her family will bring her dentures tomorrow and prognosis for diet advancement is judged to be good. SLP will continue to follow pt.     HPI HPI: 37 yo female admitted 2/11 with DKA with ARDS and intubation 2/12-2/16. PMHx: DM, HFrEF, HTN, anxiety, cocaine abuse. Chest x ray 2/17 revealed diffuse interstitial and airspace opacities in the lungs  bilaterally, increased from the prior exam.      SLP Plan  Continue with current plan of care      Recommendations for follow up therapy are one component of a multi-disciplinary discharge planning process, led by the attending physician.  Recommendations may be updated based on patient status, additional functional criteria and insurance authorization.    Recommendations  Diet recommendations: Thin liquid (full liquids) Liquids provided via: Straw;Cup Medication Administration: Whole meds with puree Supervision: Patient able to self feed Compensations: Slow rate;Small sips/bites Postural Changes and/or Swallow Maneuvers: Seated upright 90 degrees                Oral Care  Recommendations: Oral care QID;Oral care prior to ice chip/H20 Follow Up Recommendations:  (TBD) SLP Visit Diagnosis: Dysphagia, unspecified (R13.10) Plan: Continue with current plan of care          Iden Stripling I. Hardin Negus, [place], [place] Office number [phone]  Horton Marshall  12/04/2022, 11:02 AM

## 2022-12-04 NOTE — Progress Notes (Signed)
eLink Physician-Brief Progress Note Patient Name: [name] DOB: [birth year] MRN: [id number]   Date of Service  12/04/2022  HPI/Events of Note  Patient placed on BIPAP per order from Dr Tamala Julian CCM. Has tube feeds running and RN asked if that should be held while on BIPAP. Noted history of gastroparesis.   eICU Interventions  Agree with holding feeds overnight Resume when taken off bipap in AM Has CBG checks every 4 hours ordered- monitor      Intervention Category Major Interventions: Respiratory failure - evaluation and management  Godson Pollan G Takyra Cantrall 12/04/2022, 12:04 AM

## 2022-12-05 ENCOUNTER — Inpatient Hospital Stay (HOSPITAL_COMMUNITY): Payer: Medicaid Other

## 2022-12-05 DIAGNOSIS — R7401 Elevation of levels of liver transaminase levels: Secondary | ICD-10-CM | POA: Diagnosis not present

## 2022-12-05 DIAGNOSIS — E1010 Type 1 diabetes mellitus with ketoacidosis without coma: Secondary | ICD-10-CM | POA: Diagnosis not present

## 2022-12-05 DIAGNOSIS — A419 Sepsis, unspecified organism: Secondary | ICD-10-CM | POA: Diagnosis not present

## 2022-12-05 DIAGNOSIS — J15211 Pneumonia due to Methicillin susceptible Staphylococcus aureus: Secondary | ICD-10-CM | POA: Diagnosis not present

## 2022-12-05 LAB — BASIC METABOLIC PANEL
Anion gap: 10 (ref 5–15)
BUN: 21 mg/dL — ABNORMAL HIGH (ref 6–20)
CO2: 28 mmol/L (ref 22–32)
Calcium: 7.3 mg/dL — ABNORMAL LOW (ref 8.9–10.3)
Chloride: 98 mmol/L (ref 98–111)
Creatinine, Ser: 0.67 mg/dL (ref 0.44–1.00)
GFR, Estimated: >60 mL/min (ref >60)
Glucose, Bld: 237 mg/dL — ABNORMAL HIGH (ref 70–99)
Potassium: 4 mmol/L (ref 3.5–5.1)
Sodium: 136 mmol/L (ref 135–145)

## 2022-12-05 LAB — MAGNESIUM: Magnesium: 1.8 mg/dL (ref 1.7–2.4)

## 2022-12-05 LAB — GLUCOSE, CAPILLARY
Glucose-Capillary: 257 mg/dL — ABNORMAL HIGH (ref 70–99)
Glucose-Capillary: 253 mg/dL — ABNORMAL HIGH (ref 70–99)
Glucose-Capillary: 165 mg/dL — ABNORMAL HIGH (ref 70–99)
Glucose-Capillary: 233 mg/dL — ABNORMAL HIGH (ref 70–99)
Glucose-Capillary: 224 mg/dL — ABNORMAL HIGH (ref 70–99)
Glucose-Capillary: 261 mg/dL — ABNORMAL HIGH (ref 70–99)

## 2022-12-05 LAB — CBC
HCT: 18.5 % — ABNORMAL LOW (ref 36.0–46.0)
Hemoglobin: 6.2 g/dL — CL (ref 12.0–15.0)
MCH: 28.7 pg (ref 26.0–34.0)
MCHC: 33.5 g/dL (ref 30.0–36.0)
MCV: 85.6 fL (ref 80.0–100.0)
Platelets: 147 10*3/uL — ABNORMAL LOW (ref 150–400)
RBC: 2.16 MIL/uL — ABNORMAL LOW (ref 3.87–5.11)
RDW: 16.8 % — ABNORMAL HIGH (ref 11.5–15.5)
WBC: 6.7 10*3/uL (ref 4.0–10.5)
nRBC: 0 % (ref 0.0–0.2)

## 2022-12-05 LAB — PHOSPHORUS: Phosphorus: 2.3 mg/dL — ABNORMAL LOW (ref 2.5–4.6)

## 2022-12-05 LAB — PREPARE RBC (CROSSMATCH)

## 2022-12-05 MED ORDER — GLUCERNA 1.5 CAL PO LIQD
1000.0000 mL | ORAL | Status: DC
  Administered 2022-12-05 – 2022-12-06 (×2): 1000 mL
  Filled 2022-12-05 (×3): qty 1000

## 2022-12-05 MED ORDER — MIDODRINE HCL 5 MG PO TABS
10.0000 mg | ORAL_TABLET | Freq: Three times a day (TID) | ORAL | Status: DC
  Administered 2022-12-05: 10 mg
  Filled 2022-12-05 (×2): qty 2

## 2022-12-05 MED ORDER — POTASSIUM PHOSPHATES 15 MMOLE/5ML IV SOLN
30.0000 mmol | Freq: Once | INTRAVENOUS | Status: AC
  Administered 2022-12-05: 30 mmol via INTRAVENOUS
  Filled 2022-12-05: qty 10

## 2022-12-05 MED ORDER — BOOST / RESOURCE BREEZE PO LIQD CUSTOM
1.0000 | Freq: Three times a day (TID) | ORAL | Status: DC
  Administered 2022-12-05 – 2022-12-06 (×4): 1 via ORAL

## 2022-12-05 MED ORDER — SODIUM CHLORIDE 0.9% IV SOLUTION: Freq: Once | INTRAVENOUS | Status: AC

## 2022-12-05 MED ORDER — MAGNESIUM SULFATE 2 GM/50ML IV SOLN
2.0000 g | Freq: Once | INTRAVENOUS | Status: AC
  Administered 2022-12-05: 2 g via INTRAVENOUS
  Filled 2022-12-05: qty 50

## 2022-12-05 NOTE — Progress Notes (Signed)
Overnight progress note  Patient's hemoglobin has been low in the setting of critical illness.  It is 6.2 on labs this morning and was 7.0 yesterday.  Not tachycardic or hypotensive.  No bleeding reported by nursing staff.  Type and screen, 1 unit PRBCs ordered after obtaining consent from the patient.  Follow-up posttransfusion H&H.

## 2022-12-05 NOTE — Progress Notes (Signed)
SATURATION QUALIFICATIONS: (This note is used to comply with regulatory documentation for home oxygen)  Patient Saturations on Room Air at Rest = 96%  Patient Saturations on Room Air while Ambulating = 87%  Patient Saturations on 1 Liters of oxygen while Ambulating = 90%  Please briefly explain why patient needs home oxygen:

## 2022-12-05 NOTE — Progress Notes (Signed)
Nutrition Follow-up  DOCUMENTATION CODES:   Underweight, Severe malnutrition in context of chronic illness  INTERVENTION:   Transition to Nocturnal Tube Feeds via post-pyloric Cortrak tube: Switch to Glucerna 1.5 at 83 mL/hr x 12 hours from 1800 to 0600 (1000 mL per day) Provides 1500 kcal, 83 gm protein, and 759 mL free water daily. Boost Breeze po TID with meals, each supplement provides 250 kcal and 9 grams of protein Discontinue ProSource TF  NUTRITION DIAGNOSIS:   Severe Malnutrition related to chronic illness (uncontrolled T1DM (hemoglobin A1C >15.5), HFrEF) as evidenced by severe fat depletion, severe muscle depletion, percent weight loss (18.9% weight loss in < 2 months). - Ongoing, being addressed via TF  GOAL:   Patient will meet greater than or equal to 90% of their needs - Being addressed via TF  MONITOR:   Vent status, Labs, Weight trends, TF tolerance  REASON FOR ASSESSMENT:   Consult Enteral/tube feeding initiation and management (trickle TF)  ASSESSMENT:   37 year old female who presented to the ED on 2/11 with hyperglycemia and AMS. PMH of T1DM, cocaine abuse, HFrEF, HTN, anxiety, depression. Pt admitted with DKA.  02/12 Intubated, Cortrak placement (tip at the pylorus), pt proned 02/13 Pt vomited, OG tube placed to LIWS, pt unproned  02/14 Trickle TF initiated, Cortrak advanced to LOT 02/16 Extubated 02/17 transferred to floor 02/18 diet advanced to full liquids  RD received message form MD to transition pt to nocturnal feeds. RD to adjust TF order.  Pt sitting up in bed. States that she is ready to eat some "good food." RD explained that she is only on liquids and RD is not able to upgrade diet at this time. Pt agreeable to [facility], does not like Ensure. Reports that she just ordered lunch. Denies any nausea or vomiting. Discussed monitor PO intake and working towards getting feeding tube removed, pt expressed understanding.  Medications reviewed  and include: Folic acid, NovoLog SSI, Semglee, Reglan, Protonix, Miralax, Thiamine , IV antibiotics, IV magnesium sulfate, IV potassium phosphate  Labs reviewed: Sodium 136, Potassium 4.0, Phosphorus 2.3 (L), Magnesium 1.8, 24 hr CBGs 170-300  UOP: 200 mL x 24 hours   Diet Order:   Diet Order             Diet full liquid Room service appropriate? No; Fluid consistency: Thin  Diet effective now                  EDUCATION NEEDS:   Not appropriate for education at this time  Skin:  Skin Assessment: Skin Integrity Issues: Skin Integrity Issues:: Other (Comment) Other: fissures to bilateral heels, fissures to bilateral hands  Last BM:  2/18  Height:  Ht Readings from Last 1 Encounters:  11/27/22 5' (1.524 m)   Weight:  Wt Readings from Last 1 Encounters:  12/05/22 44.3 kg   Ideal Body Weight:  45.5 kg  BMI:  Body mass index is 19.07 kg/m.  Estimated Nutritional Needs:  Kcal:  1500-1700 Protein:  70-85 g Fluid:  1.5-1.7 L   Hermina Barters RD, LDN Clinical Dietitian See [facility] for contact information.

## 2022-12-05 NOTE — Progress Notes (Signed)
Pt still refusing to use Bipap for the night. No distress noted. Pt on 6L HFNC

## 2022-12-05 NOTE — Progress Notes (Signed)
Mobility Specialist Progress Note   12/05/22 1922  Mobility  Activity Ambulated with assistance in hallway  Level of Assistance Minimal assist, patient does 75% or more  Assistive Device Front wheel walker  Distance Ambulated (ft) 350 ft  Activity Response Tolerated well  Mobility Referral Yes  $Mobility charge 1 Mobility   Pre Mobility: 74 HR, 97% SpO2 on 4LO2 During Mobility: 89 HR, 87 - 92% SpO2 RA - 1LO2 Post Mobility: 85 HR, 92% SpO2 2LO2  Received in bed eager to walk. Steady gait during hallway ambulation despite x1 LOB while turning and needing MinA to regain self. Pt ambulating on RA for a majority of the time and remaining >91% SpO2. Once returning back to room pt began to desat between 87% - 88% SpO2 and required 1L of supplemental O2. Levels returned to 90% - 91% SpO2 and pt throughout having no complaint of symptoms. Returned back to room w/o fault, placed back in bed w/ bed alarm on and pt on 2LO2 per advisement of RN.     Holland Falling Mobility Specialist Please contact via SecureChat or  Rehab office at [phone]

## 2022-12-05 NOTE — Progress Notes (Signed)
Date and time results received: 12/05/22 0542   Test: Hgb Critical Value: 6.2  Name of Provider Notified: Rathore  Orders Received? Or Actions Taken?:  awaiting response

## 2022-12-05 NOTE — Progress Notes (Signed)
Pt stated she doesn't want to wear Bipap for the night.

## 2022-12-05 NOTE — Progress Notes (Addendum)
PROGRESS NOTE        PATIENT DETAILS Name: [name] Age: 37 y.o. Sex: female Date of Birth: Aug 29, 1986 Admit Date: 11/27/2022 Admitting Physician Collier Bullock, MD [id number], [facility]  Brief Summary: Patient is a 37 y.o.  female with history of DM-1, HFrEF, HTN, mood disorder, cocaine use-presenting with septic shock, acute hypoxic respiratory failure due to MSSA PNA and DKA.  Significant events: 2/12>> admit to ICU-intubated/proning 2/16>> extubated 2/18>> transferred to [facility]  Significant studies: 2/11>> CXR: Left> right airspace disease 2/12>> RUQ ultrasound: Gallbladder sludge/pericholecystic fluid 2/12>> echo: EF> 75% 2/17>> CXR: Diffuse interstitial/airspace opacities bilaterally  Significant microbiology data: 1/27>> blood culture: Negative 1/28>> blood culture: Negative 2/11>> COVID/influenza/RSV PCR: Negative 2/12>> respiratory virus panel: Negative 2/12>> BAL: MSSA, rare Candida albicans  Procedures: 2/12-2/16>> ETT 2/12>> bronchoscopy 2/12>> central venous access by PCCM  Consults: PCCM  Subjective: Feels better-claims she ate a significant portion of her meals yesterday.  Asking for diet to be advanced.  Hearing is much better today.  Objective: Vitals: Blood pressure 108/65, pulse 72, temperature 97.8 F (36.6 C), temperature source Oral, resp. rate 15, height 5' (1.524 m), weight 44.3 kg, SpO2 99 %.   Exam: Gen Exam:Alert awake-not in any distress HEENT:atraumatic, normocephalic Chest: B/L clear to auscultation anteriorly CVS:S1S2 regular Abdomen:soft non tender, non distended Extremities:no edema Neurology: Non focal Skin: no rash  Pertinent Labs/Radiology:    Latest Ref Rng & Units 12/05/2022    4:35 AM 12/04/2022    8:00 AM 12/03/2022    3:47 AM  CBC  WBC 4.0 - 10.5 K/uL 6.7  5.5  3.3   Hemoglobin 12.0 - 15.0 g/dL 6.2  7.0  7.3   Hematocrit 36.0 - 46.0 % 18.5  20.6  21.3   Platelets 150 - 400  K/uL 147  PLATELET CLUMPS NOTED ON SMEAR, UNABLE TO ESTIMATE  54     Lab Results  Component Value Date   NA 136 12/05/2022   K 4.0 12/05/2022   CL 98 12/05/2022   CO2 28 12/05/2022      Assessment/Plan: Septic shock secondary to MSSA PNA Sepsis physiology improved Decrease midodrine to 10 mg 3 times daily Ancef x 7-10 days No fever overnight-Central line removed yesterday  Acute hypoxic/hypercarbic respiratory failure Secondary to MSSA PNA with ARDS Extubated 2/16 On heated high flow-continue to wean down FiO2 as tolerated Continue pulmonary toileting/mobilization Repeat IV Lasix today-keep in negative balance-volume status stable.  Acute metabolic encephalopathy 2/2 sepsis/DKA/hypoxia/AKI Improved-completely awake/alert this am Supportive care Delirium precautions  AKI Hemodynamically mediated Improving Avoid nephrotoxic agents as much as possible  DKA Resolved  DM 1 (A1c> 15.5 on 1/30) with uncontrolled hyperglycemia CBGs on the higher side Switching to nocturnal feeds For now continue with Semglee 5 units daily and SSI-if CBGs are still elevated on 2/20 will adjust accordingly.   Recent Labs    12/04/22 2318 12/05/22 0456 12/05/22 0937  GLUCAP 170* 257* 253*      Diabetic gastroparesis Continue Reglan  Transaminitis 2/2 shock liver Improving  Thrombocytopenia Likely secondary to sepsis HIT Ab neg on 2/14 Improving Trend CBC  Normocytic anemia 2/2 critical illness No evidence of blood loss-however drop in hemoglobin today-hence PRBC 1 unit being transfused.  GERD PPI  Hypophosphatemia/Hypomagnesemia Replete/recheck Watch for refeeding syndrome.  Oropharyngeal dysphagia Due to severe debility in the setting of critical illness Continue cortrak  feeds but will switch to nocturnal feeds Continue to advance diet-and if she seems to be eating enough orally-suspect we could remove NG tube over the next few days. Advance diet per SLP  ?   Hearing loss Could be second due to impacted cerumen Improving with Debrox eardrops. Thinks she is back to baseline   Nutrition Status: Nutrition Problem: Severe Malnutrition Etiology: chronic illness (uncontrolled T1DM (hemoglobin A1C >15.5), HFrEF) Signs/Symptoms: severe fat depletion, severe muscle depletion, percent weight loss (18.9% weight loss in < 2 months) Percent weight loss: 18.9 % Interventions: Tube feeding, Refer to RD note for recommendations   Pressure Ulcer: Pressure Injury 11/30/22 Coccyx Medial Stage 2 -  Partial thickness loss of dermis presenting as a shallow open injury with a red, pink wound bed without slough. (Active)  11/30/22 0800  Location: Coccyx  Location Orientation: Medial  Staging: Stage 2 -  Partial thickness loss of dermis presenting as a shallow open injury with a red, pink wound bed without slough.  Wound Description (Comments):   Present on Admission:   Dressing Type Foam - Lift dressing to assess site every shift 12/05/22 0949     Pressure Injury 11/30/22 Buttocks Right Stage 2 -  Partial thickness loss of dermis presenting as a shallow open injury with a red, pink wound bed without slough. (Active)  11/30/22 0800  Location: Buttocks  Location Orientation: Right  Staging: Stage 2 -  Partial thickness loss of dermis presenting as a shallow open injury with a red, pink wound bed without slough.  Wound Description (Comments):   Present on Admission:   Dressing Type Foam - Lift dressing to assess site every shift 12/05/22 0949    Underweight: Estimated body mass index is 19.07 kg/m as calculated from the following:   Height as of this encounter: 5' (1.524 m).   Weight as of this encounter: 44.3 kg.   Code status:   Code Status: Full Code   DVT Prophylaxis: SCDs Start: 11/28/22 0009 Pharmacological prophylaxis on hold due to thrombocytopenia which has improved-but now with anemia-hence holding lovenox-REASSESS 2/20  Family  Communication: Daughter at bedside   Disposition Plan: Status is: Inpatient Remains inpatient appropriate because: Severity of illness   Planned Discharge Destination:Home   Diet: Diet Order             Diet full liquid Room service appropriate? No; Fluid consistency: Thin  Diet effective now                     Antimicrobial agents: Anti-infectives (From admission, onward)    Start     Dose/Rate Route Frequency Ordered Stop   11/30/22 1415  ceFAZolin (ANCEF) IVPB 2g/100 mL premix        2 g 200 mL/hr over 30 Minutes Intravenous Every 8 hours 11/30/22 1323 12/06/22 2359   11/29/22 1345  micafungin (MYCAMINE) 100 mg in sodium chloride 0.9 % 100 mL IVPB  Status:  Discontinued       See Hyperspace for full Linked Orders Report.   100 mg 105 mL/hr over 1 Hours Intravenous Every 24 hours 11/28/22 1257 11/28/22 1320   11/29/22 1200  vancomycin (VANCOCIN) 500 mg in sodium chloride 0.9 % 100 mL IVPB  Status:  Discontinued        500 mg 100 mL/hr over 60 Minutes Intravenous Every 24 hours 11/28/22 1138 11/29/22 0048   11/29/22 1200  vancomycin (VANCOREADY) IVPB 500 mg/100 mL  Status:  Discontinued  500 mg 100 mL/hr over 60 Minutes Intravenous Every 24 hours 11/29/22 0048 11/30/22 1323   11/29/22 0000  ceFEPIme (MAXIPIME) 2 g in sodium chloride 0.9 % 100 mL IVPB  Status:  Discontinued        2 g 200 mL/hr over 30 Minutes Intravenous Every 12 hours 11/28/22 1138 11/30/22 1323   11/28/22 2200  cefTRIAXone (ROCEPHIN) 1 g in sodium chloride 0.9 % 100 mL IVPB  Status:  Discontinued        1 g 200 mL/hr over 30 Minutes Intravenous Every 24 hours 11/28/22 0134 11/28/22 0741   11/28/22 2200  cefTRIAXone (ROCEPHIN) 2 g in sodium chloride 0.9 % 100 mL IVPB  Status:  Discontinued        2 g 200 mL/hr over 30 Minutes Intravenous Every 24 hours 11/28/22 0741 11/28/22 1059   11/28/22 2200  azithromycin (ZITHROMAX) 500 mg in sodium chloride 0.9 % 250 mL IVPB  Status:  Discontinued         500 mg 250 mL/hr over 60 Minutes Intravenous Every 24 hours 11/28/22 0741 11/28/22 1059   11/28/22 1345  micafungin (MYCAMINE) 200 mg in sodium chloride 0.9 % 100 mL IVPB  Status:  Discontinued       See Hyperspace for full Linked Orders Report.   200 mg 110 mL/hr over 1 Hours Intravenous Every 24 hours 11/28/22 1257 11/28/22 1320   11/28/22 1145  vancomycin (VANCOCIN) IVPB 1000 mg/200 mL premix        1,000 mg 200 mL/hr over 60 Minutes Intravenous  Once 11/28/22 1059 11/28/22 1417   11/28/22 1145  ceFEPIme (MAXIPIME) 2 g in sodium chloride 0.9 % 100 mL IVPB        2 g 200 mL/hr over 30 Minutes Intravenous  Once 11/28/22 1059 11/28/22 1248   11/28/22 0000  azithromycin (ZITHROMAX) 500 mg in sodium chloride 0.9 % 250 mL IVPB  Status:  Discontinued        500 mg 250 mL/hr over 60 Minutes Intravenous Every 24 hours 11/27/22 2349 11/28/22 0741   11/28/22 0000  cefTRIAXone (ROCEPHIN) 1 g in sodium chloride 0.9 % 100 mL IVPB        1 g 200 mL/hr over 30 Minutes Intravenous  Once 11/27/22 2349 11/28/22 0037        MEDICATIONS: Scheduled Meds:  carbamide peroxide  5 drop Right EAR BID   Chlorhexidine Gluconate Cloth  6 each Topical Daily   feeding supplement (PROSource TF20)  60 mL Per Tube TID   folic acid  1 mg Per Tube Daily   insulin aspart  0-6 Units Subcutaneous Q4H   insulin glargine-yfgn  5 Units Subcutaneous Daily   metoCLOPramide (REGLAN) injection  5 mg Intravenous Q8H   midodrine  20 mg Per Tube Q8H   mouth rinse  15 mL Mouth Rinse 4 times per day   pantoprazole (PROTONIX) IV  40 mg Intravenous Daily   polyethylene glycol  17 g Per Tube Daily   thiamine  100 mg Per Tube Daily   Continuous Infusions:  sodium chloride Stopped (12/05/22 0941)    ceFAZolin (ANCEF) IV 2 g (12/05/22 0510)   feeding supplement (VITAL AF 1.2 CAL) 1,000 mL (12/05/22 0510)   PRN Meds:.Place/Maintain arterial line **AND** sodium chloride, acetaminophen, dextrose, Gerhardt's butt cream, mouth  rinse, oxyCODONE, sodium chloride flush   I have personally reviewed following labs and imaging studies  LABORATORY DATA: CBC: Recent Labs  Lab 12/01/22 0410 12/01/22 0513 12/02/22 0404 12/02/22  1421 12/03/22 0347 12/04/22 0800 12/05/22 0435  WBC 15.2*  --  5.4  --  3.3* 5.5 6.7  HGB 7.8*   < > 7.2* 8.8* 7.3* 7.0* 6.2*  HCT 22.3*   < > 21.1* 26.0* 21.3* 20.6* 18.5*  MCV 82.6  --  83.7  --  83.2 85.8 85.6  PLT 19*  --  25*  --  54* PLATELET CLUMPS NOTED ON SMEAR, UNABLE TO ESTIMATE 147*   < > = values in this interval not displayed.     Basic Metabolic Panel: Recent Labs  Lab 12/02/22 0404 12/02/22 1421 12/02/22 1515 12/03/22 0347 12/03/22 1138 12/03/22 2005 12/04/22 0434 12/05/22 0435  NA 134* 130*  --  141 140  --  140 136  K 3.0* 8.2* 4.9 4.0 4.6  --  3.6 4.0  CL 102  --   --  105 106  --  103 98  CO2 25  --   --  27 25  --  29 28  GLUCOSE 139*  --   --  205* 240*  --  176* 237*  BUN 23*  --   --  23* 24*  --  23* 21*  CREATININE 0.81  --   --  0.96 0.85  --  0.86 0.67  CALCIUM 6.9*  --   --  7.6* 7.5*  --  7.6* 7.3*  MG 1.8  --  2.2 2.3  --  2.1  --  1.8  PHOS 1.6*  --  4.6 2.7  --  2.0*  --  2.3*     GFR: Estimated Creatinine Clearance: 66.7 mL/min (by C-G formula based on SCr of 0.67 mg/dL).  Liver Function Tests: Recent Labs  Lab 11/29/22 0656 11/30/22 0517 12/01/22 0410 12/02/22 0404 12/03/22 0347  AST 81* 46* 36 28 34  ALT 675* 533* 289* 98* 47*  ALKPHOS 182* 199* 192* 182* 168*  BILITOT 0.3 0.9 1.1 0.5 0.6  PROT 4.4* 4.6* 4.5* 4.4* 4.8*  ALBUMIN 1.6* <1.5* 1.7* 1.7* 1.7*    No results for input(s): "LIPASE", "AMYLASE" in the last 168 hours.  Recent Labs  Lab 12/01/22 0930 12/03/22 0347  AMMONIA 42* 18     Coagulation Profile: Recent Labs  Lab 11/30/22 1444  INR 1.2     Cardiac Enzymes: No results for input(s): "CKTOTAL", "CKMB", "CKMBINDEX", "TROPONINI" in the last 168 hours.  BNP (last 3 results) No results for  input(s): "PROBNP" in the last 8760 hours.  Lipid Profile: No results for input(s): "CHOL", "HDL", "LDLCALC", "TRIG", "CHOLHDL", "LDLDIRECT" in the last 72 hours.   Thyroid Function Tests: No results for input(s): "TSH", "T4TOTAL", "FREET4", "T3FREE", "THYROIDAB" in the last 72 hours.  Anemia Panel: No results for input(s): "VITAMINB12", "FOLATE", "FERRITIN", "TIBC", "IRON", "RETICCTPCT" in the last 72 hours.  Urine analysis:    Component Value Date/Time   COLORURINE YELLOW 11/28/2022 0802   APPEARANCEUR HAZY (A) 11/28/2022 0802   LABSPEC 1.017 11/28/2022 0802   PHURINE 5.0 11/28/2022 0802   GLUCOSEU >=500 (A) 11/28/2022 0802   HGBUR MODERATE (A) 11/28/2022 0802   BILIRUBINUR NEGATIVE 11/28/2022 0802   KETONESUR 20 (A) 11/28/2022 0802   PROTEINUR 30 (A) 11/28/2022 0802   UROBILINOGEN 1.0 07/28/2015 1402   NITRITE NEGATIVE 11/28/2022 0802   LEUKOCYTESUR MODERATE (A) 11/28/2022 0802    Sepsis Labs: Lactic Acid, Venous    Component Value Date/Time   LATICACIDVEN 1.3 12/02/2022 1312    MICROBIOLOGY: Recent Results (from the past 240 hour(s))  Resp  panel by RT-PCR (RSV, Flu A&B, Covid) Anterior Nasal Swab     Status: None   Collection Time: 11/27/22 11:16 PM   Specimen: Anterior Nasal Swab  Result Value Ref Range Status   SARS Coronavirus 2 by RT PCR NEGATIVE NEGATIVE Final   Influenza A by PCR NEGATIVE NEGATIVE Final   Influenza B by PCR NEGATIVE NEGATIVE Final    Comment: (NOTE) The Xpert Xpress SARS-CoV-2/FLU/RSV plus assay is intended as an aid in the diagnosis of influenza from Nasopharyngeal swab specimens and should not be used as a sole basis for treatment. Nasal washings and aspirates are unacceptable for Xpert Xpress SARS-CoV-2/FLU/RSV testing.  Fact Sheet for Patients: EntrepreneurPulse.com.au  Fact Sheet for Healthcare Providers: IncredibleEmployment.be  This test is not yet approved or cleared by the [place]  FDA and has been authorized for detection and/or diagnosis of SARS-CoV-2 by FDA under an Emergency Use Authorization (EUA). This EUA will remain in effect (meaning this test can be used) for the duration of the COVID-19 declaration under Section 564(b)(1) of the Act, 21 U.S.C. section 360bbb-3(b)(1), unless the authorization is terminated or revoked.     Resp Syncytial Virus by PCR NEGATIVE NEGATIVE Final    Comment: (NOTE) Fact Sheet for Patients: EntrepreneurPulse.com.au  Fact Sheet for Healthcare Providers: IncredibleEmployment.be  This test is not yet approved or cleared by the [place] FDA and has been authorized for detection and/or diagnosis of SARS-CoV-2 by FDA under an Emergency Use Authorization (EUA). This EUA will remain in effect (meaning this test can be used) for the duration of the COVID-19 declaration under Section 564(b)(1) of the Act, 21 U.S.C. section 360bbb-3(b)(1), unless the authorization is terminated or revoked.  Performed at [facility], [place] [address] [place], [place] [address]   Respiratory (~20 pathogens) panel by PCR     Status: None   Collection Time: 11/28/22 11:24 AM   Specimen: Nasopharyngeal Swab; Respiratory  Result Value Ref Range Status   Adenovirus NOT DETECTED NOT DETECTED Final   Coronavirus 229E NOT DETECTED NOT DETECTED Final    Comment: (NOTE) The Coronavirus on the Respiratory Panel, DOES NOT test for the novel  Coronavirus (2019 nCoV)    Coronavirus HKU1 NOT DETECTED NOT DETECTED Final   Coronavirus NL63 NOT DETECTED NOT DETECTED Final   Coronavirus OC43 NOT DETECTED NOT DETECTED Final   Metapneumovirus NOT DETECTED NOT DETECTED Final   Rhinovirus / Enterovirus NOT DETECTED NOT DETECTED Final   Influenza A NOT DETECTED NOT DETECTED Final   Influenza B NOT DETECTED NOT DETECTED Final   Parainfluenza Virus 1 NOT DETECTED NOT DETECTED Final   Parainfluenza Virus 2 NOT DETECTED NOT  DETECTED Final   Parainfluenza Virus 3 NOT DETECTED NOT DETECTED Final   Parainfluenza Virus 4 NOT DETECTED NOT DETECTED Final   Respiratory Syncytial Virus NOT DETECTED NOT DETECTED Final   Bordetella pertussis NOT DETECTED NOT DETECTED Final   Bordetella Parapertussis NOT DETECTED NOT DETECTED Final   Chlamydophila pneumoniae NOT DETECTED NOT DETECTED Final   Mycoplasma pneumoniae NOT DETECTED NOT DETECTED Final    Comment: Performed at [facility], [place]. [address], [place], [place] [address]  Culture, Respiratory w Gram Stain     Status: None   Collection Time: 11/28/22 11:27 AM   Specimen: Bronchoalveolar Lavage; Respiratory  Result Value Ref Range Status   Specimen Description BRONCHIAL ALVEOLAR LAVAGE  Final   Special Requests NONE  Final   Gram Stain   Final  NO WBC SEEN NO ORGANISMS SEEN Performed at [facility], [place] [address] [place], [place] [address]    Culture FEW STAPHYLOCOCCUS AUREUS RARE CANDIDA ALBICANS   Final   Report Status 12/01/2022 FINAL  Final   Organism ID, Bacteria STAPHYLOCOCCUS AUREUS  Final      Susceptibility   Staphylococcus aureus - MIC*    CIPROFLOXACIN <=0.5 SENSITIVE Sensitive     ERYTHROMYCIN RESISTANT Resistant     GENTAMICIN <=0.5 SENSITIVE Sensitive     OXACILLIN <=0.25 SENSITIVE Sensitive     TETRACYCLINE <=1 SENSITIVE Sensitive     VANCOMYCIN 1 SENSITIVE Sensitive     TRIMETH/SULFA <=10 SENSITIVE Sensitive     CLINDAMYCIN RESISTANT Resistant     RIFAMPIN <=0.5 SENSITIVE Sensitive     Inducible Clindamycin POSITIVE Resistant     * FEW STAPHYLOCOCCUS AUREUS  Fungus Culture With Stain     Status: Abnormal   Collection Time: 11/28/22 11:27 AM   Specimen: Bronchial Alveolar Lavage; Respiratory  Result Value Ref Range Status   Fungus Stain Final report  Final   Fungus (Mycology) Culture Preliminary report (A)  Final    Comment: (NOTE) Performed At: [facility] [address] [place], [place] [id number] Rush Farmer MD [id number]    Fungal Source BRONCHIAL ALVEOLAR LAVAGE  Final    Comment: Performed at [facility], [place] [address] [place], [place] [address]  Aspergillus Ag, BAL/Serum     Status: None   Collection Time: 11/28/22 11:27 AM   Specimen: Bronchial Alveolar Lavage; Respiratory  Result Value Ref Range Status   Aspergillus Ag, BAL/Serum 0.26 0.00 - 0.49 Index Final    Comment: (NOTE) Performed At: [facility] [place], [place] [id number] Rush Farmer MD [id number]   Fungus Culture Result     Status: None   Collection Time: 11/28/22 11:27 AM  Result Value Ref Range Status   Result 1 Comment  Final    Comment: (NOTE) KOH/Calcofluor preparation:  no fungus observed. Performed At: [facility] [place], [place] [id number] Rush Farmer MD [id number]   Fungal organism reflex     Status: Abnormal   Collection Time: 11/28/22 11:27 AM  Result Value Ref Range Status   Fungal result 1 Candida albicans (A)  Final    Comment: (NOTE) Light growth Performed At: [facility] [place], [place] [id number] Rush Farmer MD [id number]   Resp panel by RT-PCR (RSV, Flu A&B, Covid) Anterior Nasal Swab     Status: None   Collection Time: 11/28/22  4:31 PM   Specimen: Anterior Nasal Swab  Result Value Ref Range Status   SARS Coronavirus 2 by RT PCR NEGATIVE NEGATIVE Final   Influenza A by PCR NEGATIVE NEGATIVE Final   Influenza B by PCR NEGATIVE NEGATIVE Final    Comment: (NOTE) The Xpert Xpress SARS-CoV-2/FLU/RSV plus assay is intended as an aid in the diagnosis of influenza from Nasopharyngeal swab specimens and should not be used as a sole basis for treatment. Nasal washings and aspirates are unacceptable for Xpert Xpress SARS-CoV-2/FLU/RSV testing.  Fact Sheet for Patients: EntrepreneurPulse.com.au  Fact Sheet for Healthcare  Providers: IncredibleEmployment.be  This test is not yet approved or cleared by the [place] FDA and has been authorized for detection and/or diagnosis of SARS-CoV-2 by FDA under an Emergency Use Authorization (EUA). This EUA will remain in effect (meaning this test can be used) for the duration of the COVID-19 declaration under Section 564(b)(1)  of the Act, 21 U.S.C. section 360bbb-3(b)(1), unless the authorization is terminated or revoked.     Resp Syncytial Virus by PCR NEGATIVE NEGATIVE Final    Comment: (NOTE) Fact Sheet for Patients: EntrepreneurPulse.com.au  Fact Sheet for Healthcare Providers: IncredibleEmployment.be  This test is not yet approved or cleared by the [place] FDA and has been authorized for detection and/or diagnosis of SARS-CoV-2 by FDA under an Emergency Use Authorization (EUA). This EUA will remain in effect (meaning this test can be used) for the duration of the COVID-19 declaration under Section 564(b)(1) of the Act, 21 U.S.C. section 360bbb-3(b)(1), unless the authorization is terminated or revoked.  Performed at [facility], [place] [address] [place], [place] [address]     RADIOLOGY STUDIES/RESULTS: DG Chest Port 1 View  Result Date: 12/05/2022 CLINICAL DATA:  Shortness of breath EXAM: PORTABLE CHEST 1 VIEW COMPARISON:  CXR 12/03/22 FINDINGS: Enteric tube courses below diaphragm with the tip out field-of-view. The persistent bilateral interstitial opacities, slightly improved from prior exam. No pleural effusion. No pneumothorax. Unchanged cardiac and mediastinal contours. No radiographically apparent displaced rib fractures. Visualized upper abdomen is unremarkable. IMPRESSION: Persistent bilateral interstitial opacities, stable to slightly improved from prior exam. Electronically Signed   By: Marin Roberts M.D.   On: 12/05/2022 07:49     LOS: 7 days   Oren Binet, MD  [facility]    To contact the attending provider between 7A-7P or the covering provider during after hours 7P-7A, please log into the web site [email or web address] and access using universal [place] password for that web site. If you do not have the password, please call the hospital operator.  12/05/2022, 11:05 AM

## 2022-12-05 NOTE — Inpatient Diabetes Management (Addendum)
Inpatient Diabetes Program Recommendations  AACE/ADA: New Consensus Statement on Inpatient Glycemic Control (2015)  Target Ranges:  Prepandial:   less than 140 mg/dL      Peak postprandial:   less than 180 mg/dL (1-2 hours)      Critically ill patients:  140 - 180 mg/dL   Lab Results  Component Value Date   GLUCAP 253 (H) 12/05/2022   HGBA1C  11/15/2022    DISREGARD RESULTS.  SEE [id number]. TESTING TO BE CONFIRMED AT [facility]. NOTIFIED BRITTANY CLAPP,RN AT [place] [id number] 11/15/2022 BY ZBEECH.   HGBA1C >15.5 (H) 11/15/2022    Review of Glycemic Control  Latest Reference Range & Units 12/04/22 08:47 12/04/22 11:47 12/04/22 16:52 12/04/22 19:32 12/04/22 23:18 12/05/22 04:56 12/05/22 09:37  Glucose-Capillary 70 - 99 mg/dL 270 (H) 219 (H) 300 (H) 285 (H) 170 (H) 257 (H) 253 (H)  (H): Data is abnormally high  Diabetes history: DM1 Outpatient Diabetes medications: Lantus 35 units QD, Novolog 1 unit for every 15 CHO's, Correction Factor-1 units drops her 50 mg/dL, Target BG-40 Current orders for Inpatient glycemic control: Semglee 5 units QD, Novolog 0-6 units Q4H, Vital @ 40 ml/hr continuous  Inpatient Diabetes Program Recommendations:    If appropriate, might consider:  Novolog 1 unit Q4H tube feed coverage (hold if feeds are held or discontinued).  Will continue to follow while inpatient.  Thank you, Reche Dixon, MSN, [place] Diabetes Coordinator Inpatient Diabetes Program [phone] (team pager from 8a-5p)

## 2022-12-06 DIAGNOSIS — A419 Sepsis, unspecified organism: Secondary | ICD-10-CM | POA: Diagnosis not present

## 2022-12-06 DIAGNOSIS — E1010 Type 1 diabetes mellitus with ketoacidosis without coma: Secondary | ICD-10-CM | POA: Diagnosis not present

## 2022-12-06 DIAGNOSIS — J15211 Pneumonia due to Methicillin susceptible Staphylococcus aureus: Secondary | ICD-10-CM | POA: Diagnosis not present

## 2022-12-06 DIAGNOSIS — R7401 Elevation of levels of liver transaminase levels: Secondary | ICD-10-CM | POA: Diagnosis not present

## 2022-12-06 LAB — GLUCOSE, CAPILLARY
Glucose-Capillary: 258 mg/dL — ABNORMAL HIGH (ref 70–99)
Glucose-Capillary: 172 mg/dL — ABNORMAL HIGH (ref 70–99)
Glucose-Capillary: 256 mg/dL — ABNORMAL HIGH (ref 70–99)
Glucose-Capillary: 292 mg/dL — ABNORMAL HIGH (ref 70–99)
Glucose-Capillary: 304 mg/dL — ABNORMAL HIGH (ref 70–99)
Glucose-Capillary: 199 mg/dL — ABNORMAL HIGH (ref 70–99)
Glucose-Capillary: 179 mg/dL — ABNORMAL HIGH (ref 70–99)

## 2022-12-06 LAB — CBC
HCT: 24.1 % — ABNORMAL LOW (ref 36.0–46.0)
Hemoglobin: 8.5 g/dL — ABNORMAL LOW (ref 12.0–15.0)
MCH: 29.6 pg (ref 26.0–34.0)
MCHC: 35.3 g/dL (ref 30.0–36.0)
MCV: 84 fL (ref 80.0–100.0)
Platelets: 206 10*3/uL (ref 150–400)
RBC: 2.87 MIL/uL — ABNORMAL LOW (ref 3.87–5.11)
RDW: 16 % — ABNORMAL HIGH (ref 11.5–15.5)
WBC: 7.8 10*3/uL (ref 4.0–10.5)
nRBC: 0 % (ref 0.0–0.2)

## 2022-12-06 LAB — BASIC METABOLIC PANEL
Anion gap: 7 (ref 5–15)
BUN: 19 mg/dL (ref 6–20)
CO2: 28 mmol/L (ref 22–32)
Calcium: 7.7 mg/dL — ABNORMAL LOW (ref 8.9–10.3)
Chloride: 101 mmol/L (ref 98–111)
Creatinine, Ser: 0.66 mg/dL (ref 0.44–1.00)
GFR, Estimated: >60 mL/min (ref >60)
Glucose, Bld: 159 mg/dL — ABNORMAL HIGH (ref 70–99)
Potassium: 4.7 mmol/L (ref 3.5–5.1)
Sodium: 136 mmol/L (ref 135–145)

## 2022-12-06 LAB — TYPE AND SCREEN
ABO/RH(D): AB POS
Antibody Screen: NEGATIVE
Unit division: 0

## 2022-12-06 LAB — BPAM RBC
Blood Product Expiration Date: 202403112359
ISSUE DATE / TIME: 202402191001
Unit Type and Rh: 8400

## 2022-12-06 LAB — PHOSPHORUS: Phosphorus: 2.6 mg/dL (ref 2.5–4.6)

## 2022-12-06 LAB — MAGNESIUM: Magnesium: 1.9 mg/dL (ref 1.7–2.4)

## 2022-12-06 MED ORDER — MIDODRINE HCL 5 MG PO TABS
5.0000 mg | ORAL_TABLET | Freq: Three times a day (TID) | ORAL | Status: DC
  Filled 2022-12-06 (×2): qty 1

## 2022-12-06 MED ORDER — ENOXAPARIN SODIUM 40 MG/0.4ML IJ SOSY: 40.0000 mg | PREFILLED_SYRINGE | INTRAMUSCULAR | Status: DC

## 2022-12-06 MED ORDER — INSULIN ASPART 100 UNIT/ML IJ SOLN
0.0000 [IU] | Freq: Three times a day (TID) | INTRAMUSCULAR | Status: DC
  Administered 2022-12-06: 11 [IU] via SUBCUTANEOUS
  Administered 2022-12-06: 3 [IU] via SUBCUTANEOUS

## 2022-12-06 MED ORDER — FUROSEMIDE 10 MG/ML IJ SOLN
20.0000 mg | Freq: Once | INTRAMUSCULAR | Status: AC
  Administered 2022-12-06: 20 mg via INTRAVENOUS
  Filled 2022-12-06: qty 2

## 2022-12-06 MED ORDER — INSULIN GLARGINE-YFGN 100 UNIT/ML ~~LOC~~ SOLN
12.0000 [IU] | Freq: Every day | SUBCUTANEOUS | Status: DC
  Filled 2022-12-06: qty 0.12

## 2022-12-06 MED ORDER — ENOXAPARIN SODIUM 30 MG/0.3ML IJ SOSY
30.0000 mg | PREFILLED_SYRINGE | INTRAMUSCULAR | Status: DC
  Administered 2022-12-06 – 2022-12-07 (×2): 30 mg via SUBCUTANEOUS
  Filled 2022-12-06 (×2): qty 0.3

## 2022-12-06 NOTE — Progress Notes (Signed)
PROGRESS NOTE        PATIENT DETAILS Name: [name] Age: 37 y.o. Sex: female Date of Birth: June 09, 1986 Admit Date: 11/27/2022 Admitting Physician Collier Bullock, MD OX:[id number], [facility]  Brief Summary: Patient is a 37 y.o.  female with history of DM-1, HFrEF, HTN, mood disorder, cocaine use-presenting with septic shock, acute hypoxic respiratory failure due to MSSA PNA and DKA.  Significant events: 2/12>> admit to ICU-intubated/proning 2/16>> extubated 2/18>> transferred to [facility]  Significant studies: 2/11>> CXR: Left> right airspace disease 2/12>> RUQ ultrasound: Gallbladder sludge/pericholecystic fluid 2/12>> echo: EF> 75% 2/17>> CXR: Diffuse interstitial/airspace opacities bilaterally  Significant microbiology data: 1/27>> blood culture: Negative 1/28>> blood culture: Negative 2/11>> COVID/influenza/RSV PCR: Negative 2/12>> respiratory virus panel: Negative 2/12>> BAL: MSSA, rare Candida albicans  Procedures: 2/12-2/16>> ETT 2/12>> bronchoscopy 2/12>> central venous access by PCCM  Consults: PCCM  Subjective: Significantly better-claims she ate a chicken sandwich yesterday.  FiO2 requirements have come down-she is now on 4-5 L of HFNC.  Objective: Vitals: Blood pressure 138/84, pulse 78, temperature 98 F (36.7 C), temperature source Oral, resp. rate 14, height 5' (1.524 m), weight 44.3 kg, SpO2 97 %.   Exam: Gen Exam:Alert awake-not in any distress HEENT:atraumatic, normocephalic Chest: B/L clear to auscultation anteriorly CVS:S1S2 regular Abdomen:soft non tender, non distended Extremities:no edema Neurology: Non focal Skin: no rash  Pertinent Labs/Radiology:    Latest Ref Rng & Units 12/06/2022    4:40 AM 12/05/2022    4:35 AM 12/04/2022    8:00 AM  CBC  WBC 4.0 - 10.5 K/uL 7.8  6.7  5.5   Hemoglobin 12.0 - 15.0 g/dL 8.5  6.2  7.0   Hematocrit 36.0 - 46.0 % 24.1  18.5  20.6   Platelets 150 - 400 K/uL 206   147  PLATELET CLUMPS NOTED ON SMEAR, UNABLE TO ESTIMATE     Lab Results  Component Value Date   NA 136 12/06/2022   K 4.7 12/06/2022   CL 101 12/06/2022   CO2 28 12/06/2022      Assessment/Plan: Septic shock secondary to MSSA PNA Sepsis physiology improved BP now on the higher side-hence will decrease midodrine to 5 mg 3 times daily-with plans to discontinue over the next several days.   Remains on Ancef x 7-10 days.    Acute hypoxic/hypercarbic respiratory failure Secondary to MSSA PNA with ARDS Extubated 2/16 No longer on HHFNC-now on HFNC-4-5 L.   Continue to 10 to titrate down FiO2 further  Repeat IV Lasix today to ensure negative balance-although volume status stable.  Acute metabolic encephalopathy 2/2 sepsis/DKA/hypoxia/AKI Resolved  AKI Hemodynamically mediated Resolved  DKA Resolved  DM 1 (A1c> 15.5 on 1/30) with uncontrolled hyperglycemia CBGs on the higher side-in spite of switching to nocturnal NG feeds-appetite seems to be rebounding rapidly Increase Semglee to units daily-switch SSI to ACHS schedule-moderate dosing Follow and adjust   Recent Labs    12/06/22 0101 12/06/22 0318 12/06/22 0751  GLUCAP 258* 172* 256*      Diabetic gastroparesis Continue Reglan  Transaminitis 2/2 shock liver Improving  Thrombocytopenia Likely secondary to sepsis HIT Ab neg on 2/14 Resolved-platelet counts back to normal today.  Normocytic anemia 2/2 critical illness S/p 1 unit of PRBC transfused on 2/19-Hb stable this morning. Follow   GERD PPI  Hypophosphatemia/Hypomagnesemia Repleted.  Oropharyngeal dysphagia Due to severe debility in the setting  of critical illness Significant improvement in oral intake over the past several days  Advanced diet per SLP Remove cortrak tube today.   ?  Hearing loss Could be second due to impacted cerumen Significantly better with Debrox eardrops-she is back to her baseline.   Nutrition Status: Nutrition  Problem: Severe Malnutrition Etiology: chronic illness (uncontrolled T1DM (hemoglobin A1C >15.5), HFrEF) Signs/Symptoms: severe fat depletion, severe muscle depletion, percent weight loss (18.9% weight loss in < 2 months) Percent weight loss: 18.9 % Interventions: Tube feeding, Refer to RD note for recommendations   Pressure Ulcer: Pressure Injury 11/30/22 Coccyx Medial Stage 2 -  Partial thickness loss of dermis presenting as a shallow open injury with a red, pink wound bed without slough. (Active)  11/30/22 0800  Location: Coccyx  Location Orientation: Medial  Staging: Stage 2 -  Partial thickness loss of dermis presenting as a shallow open injury with a red, pink wound bed without slough.  Wound Description (Comments):   Present on Admission:   Dressing Type Foam - Lift dressing to assess site every shift 12/05/22 2100     Pressure Injury 11/30/22 Buttocks Right Stage 2 -  Partial thickness loss of dermis presenting as a shallow open injury with a red, pink wound bed without slough. (Active)  11/30/22 0800  Location: Buttocks  Location Orientation: Right  Staging: Stage 2 -  Partial thickness loss of dermis presenting as a shallow open injury with a red, pink wound bed without slough.  Wound Description (Comments):   Present on Admission:   Dressing Type Foam - Lift dressing to assess site every shift 12/05/22 2100    Underweight: Estimated body mass index is 19.07 kg/m as calculated from the following:   Height as of this encounter: 5' (1.524 m).   Weight as of this encounter: 44.3 kg.   Code status:   Code Status: Full Code   DVT Prophylaxis: SCDs Start: 11/28/22 0009 Start prophylactic Lovenox 2/20 now that thrombocytopenia has resolved.  Family Communication: None at bedside.  Disposition Plan: Status is: Inpatient Remains inpatient appropriate because: Severity of illness   Planned Discharge Destination:Home   Diet: Diet Order             Diet full liquid  Room service appropriate? No; Fluid consistency: Thin  Diet effective now                     Antimicrobial agents: Anti-infectives (From admission, onward)    Start     Dose/Rate Route Frequency Ordered Stop   11/30/22 1415  ceFAZolin (ANCEF) IVPB 2g/100 mL premix        2 g 200 mL/hr over 30 Minutes Intravenous Every 8 hours 11/30/22 1323 12/06/22 2359   11/29/22 1345  micafungin (MYCAMINE) 100 mg in sodium chloride 0.9 % 100 mL IVPB  Status:  Discontinued       See Hyperspace for full Linked Orders Report.   100 mg 105 mL/hr over 1 Hours Intravenous Every 24 hours 11/28/22 1257 11/28/22 1320   11/29/22 1200  vancomycin (VANCOCIN) 500 mg in sodium chloride 0.9 % 100 mL IVPB  Status:  Discontinued        500 mg 100 mL/hr over 60 Minutes Intravenous Every 24 hours 11/28/22 1138 11/29/22 0048   11/29/22 1200  vancomycin (VANCOREADY) IVPB 500 mg/100 mL  Status:  Discontinued        500 mg 100 mL/hr over 60 Minutes Intravenous Every 24 hours 11/29/22 0048 11/30/22 1323  11/29/22 0000  ceFEPIme (MAXIPIME) 2 g in sodium chloride 0.9 % 100 mL IVPB  Status:  Discontinued        2 g 200 mL/hr over 30 Minutes Intravenous Every 12 hours 11/28/22 1138 11/30/22 1323   11/28/22 2200  cefTRIAXone (ROCEPHIN) 1 g in sodium chloride 0.9 % 100 mL IVPB  Status:  Discontinued        1 g 200 mL/hr over 30 Minutes Intravenous Every 24 hours 11/28/22 0134 11/28/22 0741   11/28/22 2200  cefTRIAXone (ROCEPHIN) 2 g in sodium chloride 0.9 % 100 mL IVPB  Status:  Discontinued        2 g 200 mL/hr over 30 Minutes Intravenous Every 24 hours 11/28/22 0741 11/28/22 1059   11/28/22 2200  azithromycin (ZITHROMAX) 500 mg in sodium chloride 0.9 % 250 mL IVPB  Status:  Discontinued        500 mg 250 mL/hr over 60 Minutes Intravenous Every 24 hours 11/28/22 0741 11/28/22 1059   11/28/22 1345  micafungin (MYCAMINE) 200 mg in sodium chloride 0.9 % 100 mL IVPB  Status:  Discontinued       See Hyperspace for full  Linked Orders Report.   200 mg 110 mL/hr over 1 Hours Intravenous Every 24 hours 11/28/22 1257 11/28/22 1320   11/28/22 1145  vancomycin (VANCOCIN) IVPB 1000 mg/200 mL premix        1,000 mg 200 mL/hr over 60 Minutes Intravenous  Once 11/28/22 1059 11/28/22 1417   11/28/22 1145  ceFEPIme (MAXIPIME) 2 g in sodium chloride 0.9 % 100 mL IVPB        2 g 200 mL/hr over 30 Minutes Intravenous  Once 11/28/22 1059 11/28/22 1248   11/28/22 0000  azithromycin (ZITHROMAX) 500 mg in sodium chloride 0.9 % 250 mL IVPB  Status:  Discontinued        500 mg 250 mL/hr over 60 Minutes Intravenous Every 24 hours 11/27/22 2349 11/28/22 0741   11/28/22 0000  cefTRIAXone (ROCEPHIN) 1 g in sodium chloride 0.9 % 100 mL IVPB        1 g 200 mL/hr over 30 Minutes Intravenous  Once 11/27/22 2349 11/28/22 0037        MEDICATIONS: Scheduled Meds:  carbamide peroxide  5 drop Right EAR BID   Chlorhexidine Gluconate Cloth  6 each Topical Daily   feeding supplement  1 Container Oral TID WC   feeding supplement (GLUCERNA 1.5 CAL)  1,000 mL Per Tube [id number]   folic acid  1 mg Per Tube Daily   insulin aspart  0-6 Units Subcutaneous Q4H   insulin glargine-yfgn  5 Units Subcutaneous Daily   metoCLOPramide (REGLAN) injection  5 mg Intravenous Q8H   midodrine  10 mg Per Tube Q8H   mouth rinse  15 mL Mouth Rinse 4 times per day   pantoprazole (PROTONIX) IV  40 mg Intravenous Daily   polyethylene glycol  17 g Per Tube Daily   thiamine  100 mg Per Tube Daily   Continuous Infusions:  sodium chloride Stopped (12/05/22 0941)    ceFAZolin (ANCEF) IV 2 g (12/06/22 0702)   PRN Meds:.Place/Maintain arterial line **AND** sodium chloride, acetaminophen, dextrose, Gerhardt's butt cream, mouth rinse, oxyCODONE, sodium chloride flush   I have personally reviewed following labs and imaging studies  LABORATORY DATA: CBC: Recent Labs  Lab 12/02/22 0404 12/02/22 1421 12/03/22 0347 12/04/22 0800 12/05/22 0435 12/06/22 0440   WBC 5.4  --  3.3* 5.5 6.7 7.8  HGB 7.2*  8.8* 7.3* 7.0* 6.2* 8.5*  HCT 21.1* 26.0* 21.3* 20.6* 18.5* 24.1*  MCV 83.7  --  83.2 85.8 85.6 84.0  PLT 25*  --  54* PLATELET CLUMPS NOTED ON SMEAR, UNABLE TO ESTIMATE 147* 206     Basic Metabolic Panel: Recent Labs  Lab 12/02/22 1515 12/03/22 0347 12/03/22 1138 12/03/22 2005 12/04/22 0434 12/05/22 0435 12/06/22 0440  NA  --  141 140  --  140 136 136  K 4.9 4.0 4.6  --  3.6 4.0 4.7  CL  --  105 106  --  103 98 101  CO2  --  27 25  --  29 28 28  $ GLUCOSE  --  205* 240*  --  176* 237* 159*  BUN  --  23* 24*  --  23* 21* 19  CREATININE  --  0.96 0.85  --  0.86 0.67 0.66  CALCIUM  --  7.6* 7.5*  --  7.6* 7.3* 7.7*  MG 2.2 2.3  --  2.1  --  1.8 1.9  PHOS 4.6 2.7  --  2.0*  --  2.3* 2.6     GFR: Estimated Creatinine Clearance: 66.7 mL/min (by C-G formula based on SCr of 0.66 mg/dL).  Liver Function Tests: Recent Labs  Lab 11/30/22 0517 12/01/22 0410 12/02/22 0404 12/03/22 0347  AST 46* 36 28 34  ALT 533* 289* 98* 47*  ALKPHOS 199* 192* 182* 168*  BILITOT 0.9 1.1 0.5 0.6  PROT 4.6* 4.5* 4.4* 4.8*  ALBUMIN <1.5* 1.7* 1.7* 1.7*    No results for input(s): "LIPASE", "AMYLASE" in the last 168 hours.  Recent Labs  Lab 12/01/22 0930 12/03/22 0347  AMMONIA 42* 18     Coagulation Profile: Recent Labs  Lab 11/30/22 1444  INR 1.2     Cardiac Enzymes: No results for input(s): "CKTOTAL", "CKMB", "CKMBINDEX", "TROPONINI" in the last 168 hours.  BNP (last 3 results) No results for input(s): "PROBNP" in the last 8760 hours.  Lipid Profile: No results for input(s): "CHOL", "HDL", "LDLCALC", "TRIG", "CHOLHDL", "LDLDIRECT" in the last 72 hours.   Thyroid Function Tests: No results for input(s): "TSH", "T4TOTAL", "FREET4", "T3FREE", "THYROIDAB" in the last 72 hours.  Anemia Panel: No results for input(s): "VITAMINB12", "FOLATE", "FERRITIN", "TIBC", "IRON", "RETICCTPCT" in the last 72 hours.  Urine analysis:     Component Value Date/Time   COLORURINE YELLOW 11/28/2022 0802   APPEARANCEUR HAZY (A) 11/28/2022 0802   LABSPEC 1.017 11/28/2022 0802   PHURINE 5.0 11/28/2022 0802   GLUCOSEU >=500 (A) 11/28/2022 0802   HGBUR MODERATE (A) 11/28/2022 0802   BILIRUBINUR NEGATIVE 11/28/2022 0802   KETONESUR 20 (A) 11/28/2022 0802   PROTEINUR 30 (A) 11/28/2022 0802   UROBILINOGEN 1.0 07/28/2015 1402   NITRITE NEGATIVE 11/28/2022 0802   LEUKOCYTESUR MODERATE (A) 11/28/2022 0802    Sepsis Labs: Lactic Acid, Venous    Component Value Date/Time   LATICACIDVEN 1.3 12/02/2022 1312    MICROBIOLOGY: Recent Results (from the past 240 hour(s))  Resp panel by RT-PCR (RSV, Flu A&B, Covid) Anterior Nasal Swab     Status: None   Collection Time: 11/27/22 11:16 PM   Specimen: Anterior Nasal Swab  Result Value Ref Range Status   SARS Coronavirus 2 by RT PCR NEGATIVE NEGATIVE Final   Influenza A by PCR NEGATIVE NEGATIVE Final   Influenza B by PCR NEGATIVE NEGATIVE Final    Comment: (NOTE) The Xpert Xpress SARS-CoV-2/FLU/RSV plus assay is intended as an aid in the diagnosis of  influenza from Nasopharyngeal swab specimens and should not be used as a sole basis for treatment. Nasal washings and aspirates are unacceptable for Xpert Xpress SARS-CoV-2/FLU/RSV testing.  Fact Sheet for Patients: EntrepreneurPulse.com.au  Fact Sheet for Healthcare Providers: IncredibleEmployment.be  This test is not yet approved or cleared by the [place] FDA and has been authorized for detection and/or diagnosis of SARS-CoV-2 by FDA under an Emergency Use Authorization (EUA). This EUA will remain in effect (meaning this test can be used) for the duration of the COVID-19 declaration under Section 564(b)(1) of the Act, 21 U.S.C. section 360bbb-3(b)(1), unless the authorization is terminated or revoked.     Resp Syncytial Virus by PCR NEGATIVE NEGATIVE Final    Comment: (NOTE) Fact  Sheet for Patients: EntrepreneurPulse.com.au  Fact Sheet for Healthcare Providers: IncredibleEmployment.be  This test is not yet approved or cleared by the [place] FDA and has been authorized for detection and/or diagnosis of SARS-CoV-2 by FDA under an Emergency Use Authorization (EUA). This EUA will remain in effect (meaning this test can be used) for the duration of the COVID-19 declaration under Section 564(b)(1) of the Act, 21 U.S.C. section 360bbb-3(b)(1), unless the authorization is terminated or revoked.  Performed at [facility], [place] [address], [place], [place] [address]   Respiratory (~20 pathogens) panel by PCR     Status: None   Collection Time: 11/28/22 11:24 AM   Specimen: Nasopharyngeal Swab; Respiratory  Result Value Ref Range Status   Adenovirus NOT DETECTED NOT DETECTED Final   Coronavirus 229E NOT DETECTED NOT DETECTED Final    Comment: (NOTE) The Coronavirus on the Respiratory Panel, DOES NOT test for the novel  Coronavirus (2019 nCoV)    Coronavirus HKU1 NOT DETECTED NOT DETECTED Final   Coronavirus NL63 NOT DETECTED NOT DETECTED Final   Coronavirus OC43 NOT DETECTED NOT DETECTED Final   Metapneumovirus NOT DETECTED NOT DETECTED Final   Rhinovirus / Enterovirus NOT DETECTED NOT DETECTED Final   Influenza A NOT DETECTED NOT DETECTED Final   Influenza B NOT DETECTED NOT DETECTED Final   Parainfluenza Virus 1 NOT DETECTED NOT DETECTED Final   Parainfluenza Virus 2 NOT DETECTED NOT DETECTED Final   Parainfluenza Virus 3 NOT DETECTED NOT DETECTED Final   Parainfluenza Virus 4 NOT DETECTED NOT DETECTED Final   Respiratory Syncytial Virus NOT DETECTED NOT DETECTED Final   Bordetella pertussis NOT DETECTED NOT DETECTED Final   Bordetella Parapertussis NOT DETECTED NOT DETECTED Final   Chlamydophila pneumoniae NOT DETECTED NOT DETECTED Final   Mycoplasma pneumoniae NOT DETECTED NOT DETECTED Final    Comment:  Performed at [facility], [place]. [address] [place], [place] [address]  Culture, Respiratory w Gram Stain     Status: None   Collection Time: 11/28/22 11:27 AM   Specimen: Bronchoalveolar Lavage; Respiratory  Result Value Ref Range Status   Specimen Description BRONCHIAL ALVEOLAR LAVAGE  Final   Special Requests NONE  Final   Gram Stain   Final    NO WBC SEEN NO ORGANISMS SEEN Performed at [facility], [address], [place], [place] [address]    Culture FEW STAPHYLOCOCCUS AUREUS RARE CANDIDA ALBICANS   Final   Report Status 12/01/2022 FINAL  Final   Organism ID, Bacteria STAPHYLOCOCCUS AUREUS  Final      Susceptibility   Staphylococcus aureus - MIC*    CIPROFLOXACIN <=0.5 SENSITIVE Sensitive     ERYTHROMYCIN RESISTANT Resistant     GENTAMICIN <=0.5 SENSITIVE Sensitive  OXACILLIN <=0.25 SENSITIVE Sensitive     TETRACYCLINE <=1 SENSITIVE Sensitive     VANCOMYCIN 1 SENSITIVE Sensitive     TRIMETH/SULFA <=10 SENSITIVE Sensitive     CLINDAMYCIN RESISTANT Resistant     RIFAMPIN <=0.5 SENSITIVE Sensitive     Inducible Clindamycin POSITIVE Resistant     * FEW STAPHYLOCOCCUS AUREUS  Fungus Culture With Stain     Status: Abnormal   Collection Time: 11/28/22 11:27 AM   Specimen: Bronchial Alveolar Lavage; Respiratory  Result Value Ref Range Status   Fungus Stain Final report  Final   Fungus (Mycology) Culture Preliminary report (A)  Final    Comment: (NOTE) Performed At: [facility] [address] [place], [place] [id number] Rush Farmer MD [id number]    Fungal Source BRONCHIAL ALVEOLAR LAVAGE  Final    Comment: Performed at [facility], [place] [address], [place], [place] [address]  Aspergillus Ag, BAL/Serum     Status: None   Collection Time: 11/28/22 11:27 AM   Specimen: Bronchial Alveolar Lavage; Respiratory  Result Value Ref Range Status   Aspergillus Ag, BAL/Serum 0.26 0.00 - 0.49 Index Final    Comment: (NOTE) Performed At: [facility] [place], [place] [id number] Rush Farmer MD [id number]   Fungus Culture Result     Status: None   Collection Time: 11/28/22 11:27 AM  Result Value Ref Range Status   Result 1 Comment  Final    Comment: (NOTE) KOH/Calcofluor preparation:  no fungus observed. Performed At: [facility] [place], [place] [id number] Rush Farmer MD [id number]   Fungal organism reflex     Status: Abnormal   Collection Time: 11/28/22 11:27 AM  Result Value Ref Range Status   Fungal result 1 Candida albicans (A)  Final    Comment: (NOTE) Light growth Performed At: [facility] [place], [place] [id number] Rush Farmer MD [id number]   Resp panel by RT-PCR (RSV, Flu A&B, Covid) Anterior Nasal Swab     Status: None   Collection Time: 11/28/22  4:31 PM   Specimen: Anterior Nasal Swab  Result Value Ref Range Status   SARS Coronavirus 2 by RT PCR NEGATIVE NEGATIVE Final   Influenza A by PCR NEGATIVE NEGATIVE Final   Influenza B by PCR NEGATIVE NEGATIVE Final    Comment: (NOTE) The Xpert Xpress SARS-CoV-2/FLU/RSV plus assay is intended as an aid in the diagnosis of influenza from Nasopharyngeal swab specimens and should not be used as a sole basis for treatment. Nasal washings and aspirates are unacceptable for Xpert Xpress SARS-CoV-2/FLU/RSV testing.  Fact Sheet for Patients: EntrepreneurPulse.com.au  Fact Sheet for Healthcare Providers: IncredibleEmployment.be  This test is not yet approved or cleared by the [place] FDA and has been authorized for detection and/or diagnosis of SARS-CoV-2 by FDA under an Emergency Use Authorization (EUA). This EUA will remain in effect (meaning this test can be used) for the duration of the COVID-19 declaration under Section 564(b)(1) of the Act, 21 U.S.C. section 360bbb-3(b)(1), unless the authorization is terminated or revoked.      Resp Syncytial Virus by PCR NEGATIVE NEGATIVE Final    Comment: (NOTE) Fact Sheet for Patients: EntrepreneurPulse.com.au  Fact Sheet for Healthcare Providers: IncredibleEmployment.be  This test is not yet approved or cleared by the [place] FDA and has been authorized for detection and/or diagnosis of SARS-CoV-2 by FDA under an Emergency Use Authorization (EUA). This EUA will remain in effect (meaning this test can be used)  for the duration of the COVID-19 declaration under Section 564(b)(1) of the Act, 21 U.S.C. section 360bbb-3(b)(1), unless the authorization is terminated or revoked.  Performed at [facility], [place] [address], [place], [place] [address]     RADIOLOGY STUDIES/RESULTS: DG Chest Port 1 View  Result Date: 12/05/2022 CLINICAL DATA:  Shortness of breath EXAM: PORTABLE CHEST 1 VIEW COMPARISON:  CXR 12/03/22 FINDINGS: Enteric tube courses below diaphragm with the tip out field-of-view. The persistent bilateral interstitial opacities, slightly improved from prior exam. No pleural effusion. No pneumothorax. Unchanged cardiac and mediastinal contours. No radiographically apparent displaced rib fractures. Visualized upper abdomen is unremarkable. IMPRESSION: Persistent bilateral interstitial opacities, stable to slightly improved from prior exam. Electronically Signed   By: Marin Roberts M.D.   On: 12/05/2022 07:49     LOS: 8 days   Oren Binet, MD  [facility]    To contact the attending provider between 7A-7P or the covering provider during after hours 7P-7A, please log into the web site [email or web address] and access using universal [place] password for that web site. If you do not have the password, please call the hospital operator.  12/06/2022, 9:31 AM

## 2022-12-06 NOTE — Progress Notes (Signed)
  Inpatient Rehab Admissions Coordinator :  Per therapy recommendations, patient was screened for CIR candidacy by Danne Baxter RN MSN.  At this time patient appears to be a potential candidate for CIR but progressing rapidly and may not require CIR admit.. I will place a rehab consult per protocol for full assessment. Please call me with any questions.  Danne Baxter RN MSN Admissions Coordinator [phone]

## 2022-12-06 NOTE — Progress Notes (Signed)
Mobility Specialist Progress Note   12/06/22 1415  Mobility  Activity Ambulated with assistance in hallway  Level of Assistance Minimal assist, patient does 75% or more  Assistive Device Other (Comment) (IV pole)  Distance Ambulated (ft) 200 ft  Activity Response Tolerated well  Mobility Referral Yes  $Mobility charge 1 Mobility   Pre Mobility: 101 HR, 95% SpO2 on RA  During Mobility: 117 HR, 91% SpO2 on RA Post Mobility: 116 HR, 93% SpO2 on RA  Received sitting EOB eager for ambulation. X1 LOB before exiting room for hallway ambulation and encouraged the use of the IV pole for steadiness. MinA during ambulation d/t scissor like gait but no faults. Pt able the remain >91% SpO2 throughout while on RA. Returned back to bed w/ needs met and call bell in reach.    Holland Falling Mobility Specialist Please contact via SecureChat or  Rehab office at [phone]

## 2022-12-06 NOTE — Progress Notes (Signed)
Physical Therapy Treatment Patient Details Name: [name] MRN: [id number] DOB: June 26, 1986 Today's Date: 12/06/2022   History of Present Illness 37 yo female admitted 2/11 with DKA with ARDS and intubation 2/12-2/16. PMHx: DM, HFrEF, HTN, anxiety, cocaine abuse    PT Comments    Pt greeted supine and agreeable to session with continued progress towards acute goals, however pt continues to be limited by decreased activity tolerance, impaired balance/postural reactions and decreased cardiopulmonary endurance. Pt able to progress gait without AD with min assist provided for steadying assist as pt weaving R/L throughout with narrow BOS with pt unable to correct with cues. Encouraged continued RW use for safety with mobility. Current plan remains appropriate to address deficits and maximize functional independence  and safety and decrease risk for falls. Pt continues to benefit from skilled PT services to progress toward functional mobility goals.    Recommendations for follow up therapy are one component of a multi-disciplinary discharge planning process, led by the attending physician.  Recommendations may be updated based on patient status, additional functional criteria and insurance authorization.  Follow Up Recommendations  Acute inpatient rehab (3hours/day)     Assistance Recommended at Discharge Frequent or constant Supervision/Assistance  Patient can return home with the following A lot of help with walking and/or transfers;A lot of help with bathing/dressing/bathroom;Assistance with cooking/housework;Direct supervision/assist for medications management;Assist for transportation;Help with stairs or ramp for entrance;Direct supervision/assist for financial management   Equipment Recommendations  Rolling walker (2 wheels);BSC/3in1    Recommendations for Other Services       Precautions / Restrictions Precautions Precautions: Fall;Other (comment) Precaution Comments: watch  sats Restrictions Weight Bearing Restrictions: No     Mobility  Bed Mobility Overal bed mobility: Needs Assistance Bed Mobility: Supine to Sit, Sit to Supine     Supine to sit: Supervision, HOB elevated Sit to supine: Min guard   General bed mobility comments: supervision/min gaurd for safety    Transfers Overall transfer level: Needs assistance Equipment used: Rolling walker (2 wheels) Transfers: Sit to/from Stand, Bed to chair/wheelchair/BSC Sit to Stand: Min guard           General transfer comment: min guard for safety/ line management    Ambulation/Gait Ambulation/Gait assistance: Min guard, Min assist Gait Distance (Feet): 400 Feet Assistive device: Rolling walker (2 wheels), None Gait Pattern/deviations: Step-through pattern, Narrow base of support, Drifts right/left Gait velocity: decr     General Gait Details: slow unsteady gait with and without AD, pt with narrow BOS needing cues to widen, with pt drifting R/L throughout with RW and without with pt aware but unable to correct, no overt LOB throughout, encouraged continued RW use   Stairs             Wheelchair Mobility    Modified Rankin (Stroke Patients Only)       Balance Overall balance assessment: Needs assistance Sitting-balance support: Feet supported, No upper extremity supported Sitting balance-Leahy Scale: Good     Standing balance support: Single extremity supported Standing balance-Leahy Scale: Fair Standing balance comment: able to compelte dynamic tasks without UE support with mild unsteadiness                            Cognition Arousal/Alertness: Awake/alert Behavior During Therapy: WFL for tasks assessed/performed Overall Cognitive Status: Within Functional Limits for tasks assessed  Exercises      General Comments General comments (skin integrity, edema, etc.): VSS on supplemental O2       Pertinent Vitals/Pain Pain Assessment Pain Assessment: No/denies pain Pain Intervention(s): Monitored during session    Home Living                          Prior Function            PT Goals (current goals can now be found in the care plan section) Acute Rehab PT Goals Patient Stated Goal: return home PT Goal Formulation: With patient/family Time For Goal Achievement: 12/16/22 Progress towards PT goals: Progressing toward goals    Frequency    Min 3X/week      PT Plan      Co-evaluation              AM-PAC PT "6 Clicks" Mobility   Outcome Measure  Help needed turning from your back to your side while in a flat bed without using bedrails?: A Little Help needed moving from lying on your back to sitting on the side of a flat bed without using bedrails?: A Little Help needed moving to and from a bed to a chair (including a wheelchair)?: A Little Help needed standing up from a chair using your arms (e.g., wheelchair or bedside chair)?: A Little Help needed to walk in hospital room?: A Little Help needed climbing 3-5 steps with a railing? : A Little 6 Click Score: 18    End of Session Equipment Utilized During Treatment: Oxygen Activity Tolerance: Patient tolerated treatment well Patient left: with call bell/phone within reach;in bed;with bed alarm set Nurse Communication: Mobility status PT Visit Diagnosis: Other abnormalities of gait and mobility (R26.89);Muscle weakness (generalized) (M62.81)     Time: [id number] PT Time Calculation (min) (ACUTE ONLY): 25 min  Charges:  $Gait Training: 8-22 mins $Therapeutic Activity: 8-22 mins                     Ronella Plunk R. PTA Acute Rehabilitation Services Office: [place] 12/06/2022, 9:54 AM

## 2022-12-06 NOTE — Progress Notes (Signed)
Speech Language Pathology Treatment: Dysphagia  Patient Details Name: [name] MRN: [id number] DOB: Jan 01, 1986 Today's Date: 12/06/2022 Time: [id number] SLP Time Calculation (min) (ACUTE ONLY): 10 min  Assessment / Plan / Recommendation Clinical Impression  Patient seen by SLP for skilled treatment focused on dysphagia goals. She reportedly had a chicken biscuit yesterday and patient tells SLP that she did fine with it. After reviewing SLP notes from previous visits, SLP observed patient with PO intake of thin liquids (water) via straw sips. She exhibited timely swallow initiation, no overt s/s aspiration or penetration and no change in vitals. Her voice was strong, clear and patient telling SLP her sister told her it was back to normal. Patient denies any throat pain or discomfort s/p 4 day intubation (extubated 2/16). She has her dentures and they appeared to be well-fitting. SLP recommending upgrade to regular texture solids, continue with thin liquids. No further skilled intervention warranted.   HPI HPI: 37 yo female admitted 2/11 with DKA with ARDS and intubation 2/12-2/16. PMHx: DM, HFrEF, HTN, anxiety, cocaine abuse. Chest x ray 2/17 revealed diffuse interstitial and airspace opacities in the lungs  bilaterally, increased from the prior exam.      SLP Plan  All goals met;Discharge SLP treatment due to (comment)      Recommendations for follow up therapy are one component of a multi-disciplinary discharge planning process, led by the attending physician.  Recommendations may be updated based on patient status, additional functional criteria and insurance authorization.    Recommendations  Diet recommendations: Regular;Thin liquid Liquids provided via: Straw;Cup Medication Administration: Whole meds with liquid Supervision: Patient able to self feed Compensations: Small sips/bites Postural Changes and/or Swallow Maneuvers: Seated upright 90 degrees                Oral  Care Recommendations: Oral care BID;Patient independent with oral care Follow Up Recommendations: No SLP follow up Assistance recommended at discharge: None SLP Visit Diagnosis: Dysphagia, unspecified (R13.10) Plan: All goals met;Discharge SLP treatment due to (comment)          Sonia Baller, MA, CCC-SLP Speech Therapy

## 2022-12-06 NOTE — Progress Notes (Signed)
Mobility Specialist Progress Note   12/06/22 1356  Mobility  Activity Refused mobility   Pt c/o fatiguing and requesting to rest for awhile. Will f/u later if time permits.   Holland Falling Mobility Specialist Please contact via SecureChat or  Rehab office at [phone]

## 2022-12-06 NOTE — Progress Notes (Signed)
Pt refusing to wear Bipap for the night tonight.

## 2022-12-07 ENCOUNTER — Inpatient Hospital Stay (HOSPITAL_COMMUNITY): Payer: Medicaid Other

## 2022-12-07 DIAGNOSIS — E1010 Type 1 diabetes mellitus with ketoacidosis without coma: Secondary | ICD-10-CM | POA: Diagnosis not present

## 2022-12-07 DIAGNOSIS — J15211 Pneumonia due to Methicillin susceptible Staphylococcus aureus: Secondary | ICD-10-CM | POA: Diagnosis not present

## 2022-12-07 DIAGNOSIS — R7401 Elevation of levels of liver transaminase levels: Secondary | ICD-10-CM | POA: Diagnosis not present

## 2022-12-07 DIAGNOSIS — A419 Sepsis, unspecified organism: Secondary | ICD-10-CM | POA: Diagnosis not present

## 2022-12-07 LAB — BASIC METABOLIC PANEL
Anion gap: 11 (ref 5–15)
BUN: 26 mg/dL — ABNORMAL HIGH (ref 6–20)
CO2: 27 mmol/L (ref 22–32)
Calcium: 8.2 mg/dL — ABNORMAL LOW (ref 8.9–10.3)
Chloride: 95 mmol/L — ABNORMAL LOW (ref 98–111)
Creatinine, Ser: 0.98 mg/dL (ref 0.44–1.00)
GFR, Estimated: >60 mL/min (ref >60)
Glucose, Bld: 213 mg/dL — ABNORMAL HIGH (ref 70–99)
Potassium: 3.7 mmol/L (ref 3.5–5.1)
Sodium: 133 mmol/L — ABNORMAL LOW (ref 135–145)

## 2022-12-07 LAB — GLUCOSE, CAPILLARY
Glucose-Capillary: 560 mg/dL (ref 70–99)
Glucose-Capillary: 562 mg/dL (ref 70–99)
Glucose-Capillary: 462 mg/dL — ABNORMAL HIGH (ref 70–99)

## 2022-12-07 LAB — GLUCOSE, RANDOM: Glucose, Bld: 703 mg/dL (ref 70–99)

## 2022-12-07 MED ORDER — FUROSEMIDE 10 MG/ML IJ SOLN
20.0000 mg | Freq: Once | INTRAMUSCULAR | Status: AC
  Administered 2022-12-07: 20 mg via INTRAVENOUS
  Filled 2022-12-07: qty 2

## 2022-12-07 MED ORDER — INSULIN ASPART 100 UNIT/ML IJ SOLN: 35.0000 [IU] | Freq: Once | INTRAMUSCULAR | Status: DC

## 2022-12-07 MED ORDER — INSULIN ASPART 100 UNIT/ML IJ SOLN
20.0000 [IU] | Freq: Once | INTRAMUSCULAR | Status: AC
  Administered 2022-12-07: 20 [IU] via SUBCUTANEOUS

## 2022-12-07 MED ORDER — INSULIN ASPART 100 UNIT/ML IJ SOLN: 20.0000 [IU] | Freq: Once | INTRAMUSCULAR | Status: DC

## 2022-12-07 MED ORDER — INSULIN GLARGINE-YFGN 100 UNIT/ML ~~LOC~~ SOLN
25.0000 [IU] | Freq: Every day | SUBCUTANEOUS | Status: DC
  Administered 2022-12-07: 25 [IU] via SUBCUTANEOUS
  Filled 2022-12-07: qty 0.25

## 2022-12-07 MED ORDER — INSULIN ASPART 100 UNIT/ML IJ SOLN
4.0000 [IU] | Freq: Three times a day (TID) | INTRAMUSCULAR | Status: DC
  Administered 2022-12-07: 4 [IU] via SUBCUTANEOUS

## 2022-12-07 MED ORDER — INSULIN ASPART 100 UNIT/ML IJ SOLN: 0.0000 [IU] | Freq: Three times a day (TID) | INTRAMUSCULAR | Status: DC

## 2022-12-07 NOTE — Progress Notes (Signed)
PROGRESS NOTE        PATIENT DETAILS Name: [name] Age: 37 y.o. Sex: female Date of Birth: [birth year] Admit Date: 11/27/2022 Admitting Physician Collier Bullock, MD [id number], [facility]  Brief Summary: Patient is a 37 y.o.  female with history of DM-1, HFrEF, HTN, mood disorder, cocaine use-presenting with septic shock, acute hypoxic respiratory failure due to MSSA PNA and DKA.  Significant events: 2/12>> admit to ICU-intubated/proning 2/16>> extubated 2/18>> transferred to [facility]  Significant studies: 2/11>> CXR: Left> right airspace disease 2/12>> RUQ ultrasound: Gallbladder sludge/pericholecystic fluid 2/12>> echo: EF> 75% 2/17>> CXR: Diffuse interstitial/airspace opacities bilaterally 2/19>> CXR: Persistent diffuse bilateral interstitial opacities. 2/21>> CXR: Improved interstitial airspace opacities  Significant microbiology data: 1/27>> blood culture: Negative 1/28>> blood culture: Negative 2/11>> COVID/influenza/RSV PCR: Negative 2/12>> respiratory virus panel: Negative 2/12>> BAL: MSSA, rare Candida albicans  Procedures: 2/12-2/16>> ETT 2/12>> bronchoscopy 2/12>> central venous access by PCCM  Consults: PCCM  Subjective: Overall much better-anxious to go home.  CBGs more than 500 today.  She was unfortunately on a regular diet-and not on a diabetic diet  Objective: Vitals: Blood pressure 133/73, pulse 96, temperature 98.5 F (36.9 C), temperature source Oral, resp. rate 18, height 5' (1.524 m), weight 44.3 kg, SpO2 92 %.   Exam: Gen Exam:Alert awake-not in any distress HEENT:atraumatic, normocephalic Chest: B/L clear to auscultation anteriorly CVS:S1S2 regular Abdomen:soft non tender, non distended Extremities:no edema Neurology: Non focal Skin: no rash  Pertinent Labs/Radiology:    Latest Ref Rng & Units 12/06/2022    4:40 AM 12/05/2022    4:35 AM 12/04/2022    8:00 AM  CBC  WBC 4.0 - 10.5 K/uL 7.8  6.7   5.5   Hemoglobin 12.0 - 15.0 g/dL 8.5  6.2  7.0   Hematocrit 36.0 - 46.0 % 24.1  18.5  20.6   Platelets 150 - 400 K/uL 206  147  PLATELET CLUMPS NOTED ON SMEAR, UNABLE TO ESTIMATE     Lab Results  Component Value Date   NA 136 12/06/2022   K 4.7 12/06/2022   CL 101 12/06/2022   CO2 28 12/06/2022      Assessment/Plan: Septic shock secondary to MSSA PNA Sepsis physiology improved BP now on the higher side-hence will decrease midodrine to 5 mg 3 times daily-with plans to discontinue over the next several days.   Remains on Ancef x 7-10 days.    Acute hypoxic/hypercarbic respiratory failure Secondary to MSSA PNA with ARDS Extubated 2/16 Good improvement in hypoxia over the last several days-down to 2-3 L of oxygen (previously on heated high flow) CXR today with significant improvement in interstitial opacities Continue to attempt to titrate off oxygen as tolerated. Repeat another dose of IV Lasix to ensure negative balance.  Acute metabolic encephalopathy 2/2 sepsis/DKA/hypoxia/AKI Resolved  AKI Hemodynamically mediated Resolved  DKA Resolved  DM 1 (A1c> 15.5 on 1/30) with uncontrolled hyperglycemia CBGs significantly elevated today  Change to diabetic diet Increase Semglee to 25 units daily Add 4 units of scheduled NovoLog with meals Change SSI to resistant scale Follow and adjust   Recent Labs    12/06/22 2112 12/07/22 0740 12/07/22 0741  GLUCAP 179* 562* 560*      Diabetic gastroparesis Continue Reglan  Transaminitis 2/2 shock liver Improving  Thrombocytopenia Likely secondary to sepsis HIT Ab neg on 2/14 Resolved-platelet counts back to normal  Normocytic anemia 2/2 critical illness S/p 1 unit of PRBC transfused on 2/19-Hb stable since then Follow   GERD PPI  Hypophosphatemia/Hypomagnesemia Repleted  Oropharyngeal dysphagia Due to severe debility in the setting of critical illness Significant improvement in oral intake over the past  several days  Advanced diet per SLP Cortrak tube removed on 2/20  ?  Hearing loss Could be second due to impacted cerumen Significantly better with Debrox eardrops-she is back to her baseline.   Nutrition Status: Nutrition Problem: Severe Malnutrition Etiology: chronic illness (uncontrolled T1DM (hemoglobin A1C >15.5), HFrEF) Signs/Symptoms: severe fat depletion, severe muscle depletion, percent weight loss (18.9% weight loss in < 2 months) Percent weight loss: 18.9 % Interventions: Tube feeding, Refer to RD note for recommendations   Pressure Ulcer: Pressure Injury 11/30/22 Coccyx Medial Stage 2 -  Partial thickness loss of dermis presenting as a shallow open injury with a red, pink wound bed without slough. (Active)  11/30/22 0800  Location: Coccyx  Location Orientation: Medial  Staging: Stage 2 -  Partial thickness loss of dermis presenting as a shallow open injury with a red, pink wound bed without slough.  Wound Description (Comments):   Present on Admission:   Dressing Type Foam - Lift dressing to assess site every shift 12/06/22 1930     Pressure Injury 11/30/22 Buttocks Right Stage 2 -  Partial thickness loss of dermis presenting as a shallow open injury with a red, pink wound bed without slough. (Active)  11/30/22 0800  Location: Buttocks  Location Orientation: Right  Staging: Stage 2 -  Partial thickness loss of dermis presenting as a shallow open injury with a red, pink wound bed without slough.  Wound Description (Comments):   Present on Admission:   Dressing Type Foam - Lift dressing to assess site every shift 12/06/22 1930    Underweight: Estimated body mass index is 19.07 kg/m as calculated from the following:   Height as of this encounter: 5' (1.524 m).   Weight as of this encounter: 44.3 kg.   Code status:   Code Status: Full Code   DVT Prophylaxis: enoxaparin (LOVENOX) injection 30 mg Start: 12/06/22 1100 SCDs Start: 11/28/22 0009 Start prophylactic  Lovenox 2/20 now that thrombocytopenia has resolved.  Family Communication: None at bedside.  Disposition Plan: Status is: Inpatient Remains inpatient appropriate because: Severity of illness   Planned Discharge Destination:Home   Diet: Diet Order             Diet Carb Modified Fluid consistency: Thin; Room service appropriate? Yes  Diet effective now                     Antimicrobial agents: Anti-infectives (From admission, onward)    Start     Dose/Rate Route Frequency Ordered Stop   11/30/22 1415  ceFAZolin (ANCEF) IVPB 2g/100 mL premix        2 g 200 mL/hr over 30 Minutes Intravenous Every 8 hours 11/30/22 1323 12/07/22 0541   11/29/22 1345  micafungin (MYCAMINE) 100 mg in sodium chloride 0.9 % 100 mL IVPB  Status:  Discontinued       See Hyperspace for full Linked Orders Report.   100 mg 105 mL/hr over 1 Hours Intravenous Every 24 hours 11/28/22 1257 11/28/22 1320   11/29/22 1200  vancomycin (VANCOCIN) 500 mg in sodium chloride 0.9 % 100 mL IVPB  Status:  Discontinued        500 mg 100 mL/hr over 60 Minutes Intravenous Every 24 hours  11/28/22 1138 11/29/22 0048   11/29/22 1200  vancomycin (VANCOREADY) IVPB 500 mg/100 mL  Status:  Discontinued        500 mg 100 mL/hr over 60 Minutes Intravenous Every 24 hours 11/29/22 0048 11/30/22 1323   11/29/22 0000  ceFEPIme (MAXIPIME) 2 g in sodium chloride 0.9 % 100 mL IVPB  Status:  Discontinued        2 g 200 mL/hr over 30 Minutes Intravenous Every 12 hours 11/28/22 1138 11/30/22 1323   11/28/22 2200  cefTRIAXone (ROCEPHIN) 1 g in sodium chloride 0.9 % 100 mL IVPB  Status:  Discontinued        1 g 200 mL/hr over 30 Minutes Intravenous Every 24 hours 11/28/22 0134 11/28/22 0741   11/28/22 2200  cefTRIAXone (ROCEPHIN) 2 g in sodium chloride 0.9 % 100 mL IVPB  Status:  Discontinued        2 g 200 mL/hr over 30 Minutes Intravenous Every 24 hours 11/28/22 0741 11/28/22 1059   11/28/22 2200  azithromycin (ZITHROMAX) 500 mg in  sodium chloride 0.9 % 250 mL IVPB  Status:  Discontinued        500 mg 250 mL/hr over 60 Minutes Intravenous Every 24 hours 11/28/22 0741 11/28/22 1059   11/28/22 1345  micafungin (MYCAMINE) 200 mg in sodium chloride 0.9 % 100 mL IVPB  Status:  Discontinued       See Hyperspace for full Linked Orders Report.   200 mg 110 mL/hr over 1 Hours Intravenous Every 24 hours 11/28/22 1257 11/28/22 1320   11/28/22 1145  vancomycin (VANCOCIN) IVPB 1000 mg/200 mL premix        1,000 mg 200 mL/hr over 60 Minutes Intravenous  Once 11/28/22 1059 11/28/22 1417   11/28/22 1145  ceFEPIme (MAXIPIME) 2 g in sodium chloride 0.9 % 100 mL IVPB        2 g 200 mL/hr over 30 Minutes Intravenous  Once 11/28/22 1059 11/28/22 1248   11/28/22 0000  azithromycin (ZITHROMAX) 500 mg in sodium chloride 0.9 % 250 mL IVPB  Status:  Discontinued        500 mg 250 mL/hr over 60 Minutes Intravenous Every 24 hours 11/27/22 2349 11/28/22 0741   11/28/22 0000  cefTRIAXone (ROCEPHIN) 1 g in sodium chloride 0.9 % 100 mL IVPB        1 g 200 mL/hr over 30 Minutes Intravenous  Once 11/27/22 2349 11/28/22 0037        MEDICATIONS: Scheduled Meds:  carbamide peroxide  5 drop Right EAR BID   Chlorhexidine Gluconate Cloth  6 each Topical Daily   enoxaparin (LOVENOX) injection  30 mg Subcutaneous Q24H   feeding supplement (GLUCERNA 1.5 CAL)  1,000 mL Per Tube [id number]   folic acid  1 mg Per Tube Daily   insulin aspart  0-20 Units Subcutaneous TID WC   insulin aspart  4 Units Subcutaneous TID WC   insulin glargine-yfgn  25 Units Subcutaneous Daily   metoCLOPramide (REGLAN) injection  5 mg Intravenous Q8H   midodrine  5 mg Per Tube Q8H   mouth rinse  15 mL Mouth Rinse 4 times per day   pantoprazole (PROTONIX) IV  40 mg Intravenous Daily   polyethylene glycol  17 g Per Tube Daily   thiamine  100 mg Per Tube Daily   Continuous Infusions:  sodium chloride Stopped (12/05/22 0941)   PRN Meds:.Place/Maintain arterial line **AND** sodium  chloride, acetaminophen, dextrose, Gerhardt's butt cream, mouth rinse, oxyCODONE, sodium chloride flush  I have personally reviewed following labs and imaging studies  LABORATORY DATA: CBC: Recent Labs  Lab 12/02/22 0404 12/02/22 1421 12/03/22 0347 12/04/22 0800 12/05/22 0435 12/06/22 0440  WBC 5.4  --  3.3* 5.5 6.7 7.8  HGB 7.2* 8.8* 7.3* 7.0* 6.2* 8.5*  HCT 21.1* 26.0* 21.3* 20.6* 18.5* 24.1*  MCV 83.7  --  83.2 85.8 85.6 84.0  PLT 25*  --  54* PLATELET CLUMPS NOTED ON SMEAR, UNABLE TO ESTIMATE 147* 206     Basic Metabolic Panel: Recent Labs  Lab 12/02/22 1515 12/03/22 0347 12/03/22 1138 12/03/22 2005 12/04/22 0434 12/05/22 0435 12/06/22 0440  NA  --  141 140  --  140 136 136  K 4.9 4.0 4.6  --  3.6 4.0 4.7  CL  --  105 106  --  103 98 101  CO2  --  27 25  --  29 28 28  $ GLUCOSE  --  205* 240*  --  176* 237* 159*  BUN  --  23* 24*  --  23* 21* 19  CREATININE  --  0.96 0.85  --  0.86 0.67 0.66  CALCIUM  --  7.6* 7.5*  --  7.6* 7.3* 7.7*  MG 2.2 2.3  --  2.1  --  1.8 1.9  PHOS 4.6 2.7  --  2.0*  --  2.3* 2.6     GFR: Estimated Creatinine Clearance: 66.7 mL/min (by C-G formula based on SCr of 0.66 mg/dL).  Liver Function Tests: Recent Labs  Lab 12/01/22 0410 12/02/22 0404 12/03/22 0347  AST 36 28 34  ALT 289* 98* 47*  ALKPHOS 192* 182* 168*  BILITOT 1.1 0.5 0.6  PROT 4.5* 4.4* 4.8*  ALBUMIN 1.7* 1.7* 1.7*    No results for input(s): "LIPASE", "AMYLASE" in the last 168 hours.  Recent Labs  Lab 12/01/22 0930 12/03/22 0347  AMMONIA 42* 18     Coagulation Profile: Recent Labs  Lab 11/30/22 1444  INR 1.2     Cardiac Enzymes: No results for input(s): "CKTOTAL", "CKMB", "CKMBINDEX", "TROPONINI" in the last 168 hours.  BNP (last 3 results) No results for input(s): "PROBNP" in the last 8760 hours.  Lipid Profile: No results for input(s): "CHOL", "HDL", "LDLCALC", "TRIG", "CHOLHDL", "LDLDIRECT" in the last 72 hours.   Thyroid Function  Tests: No results for input(s): "TSH", "T4TOTAL", "FREET4", "T3FREE", "THYROIDAB" in the last 72 hours.  Anemia Panel: No results for input(s): "VITAMINB12", "FOLATE", "FERRITIN", "TIBC", "IRON", "RETICCTPCT" in the last 72 hours.  Urine analysis:    Component Value Date/Time   COLORURINE YELLOW 11/28/2022 0802   APPEARANCEUR HAZY (A) 11/28/2022 0802   LABSPEC 1.017 11/28/2022 0802   PHURINE 5.0 11/28/2022 0802   GLUCOSEU >=500 (A) 11/28/2022 0802   HGBUR MODERATE (A) 11/28/2022 0802   BILIRUBINUR NEGATIVE 11/28/2022 0802   KETONESUR 20 (A) 11/28/2022 0802   PROTEINUR 30 (A) 11/28/2022 0802   UROBILINOGEN 1.0 07/28/2015 1402   NITRITE NEGATIVE 11/28/2022 0802   LEUKOCYTESUR MODERATE (A) 11/28/2022 0802    Sepsis Labs: Lactic Acid, Venous    Component Value Date/Time   LATICACIDVEN 1.3 12/02/2022 1312    MICROBIOLOGY: Recent Results (from the past 240 hour(s))  Resp panel by RT-PCR (RSV, Flu A&B, Covid) Anterior Nasal Swab     Status: None   Collection Time: 11/27/22 11:16 PM   Specimen: Anterior Nasal Swab  Result Value Ref Range Status   SARS Coronavirus 2 by RT PCR NEGATIVE NEGATIVE Final   Influenza A  by PCR NEGATIVE NEGATIVE Final   Influenza B by PCR NEGATIVE NEGATIVE Final    Comment: (NOTE) The Xpert Xpress SARS-CoV-2/FLU/RSV plus assay is intended as an aid in the diagnosis of influenza from Nasopharyngeal swab specimens and should not be used as a sole basis for treatment. Nasal washings and aspirates are unacceptable for Xpert Xpress SARS-CoV-2/FLU/RSV testing.  Fact Sheet for Patients: EntrepreneurPulse.com.au  Fact Sheet for Healthcare Providers: IncredibleEmployment.be  This test is not yet approved or cleared by the [place] FDA and has been authorized for detection and/or diagnosis of SARS-CoV-2 by FDA under an Emergency Use Authorization (EUA). This EUA will remain in effect (meaning this test can be used)  for the duration of the COVID-19 declaration under Section 564(b)(1) of the Act, 21 U.S.C. section 360bbb-3(b)(1), unless the authorization is terminated or revoked.     Resp Syncytial Virus by PCR NEGATIVE NEGATIVE Final    Comment: (NOTE) Fact Sheet for Patients: EntrepreneurPulse.com.au  Fact Sheet for Healthcare Providers: IncredibleEmployment.be  This test is not yet approved or cleared by the [place] FDA and has been authorized for detection and/or diagnosis of SARS-CoV-2 by FDA under an Emergency Use Authorization (EUA). This EUA will remain in effect (meaning this test can be used) for the duration of the COVID-19 declaration under Section 564(b)(1) of the Act, 21 U.S.C. section 360bbb-3(b)(1), unless the authorization is terminated or revoked.  Performed at [facility], [place] [address], [place], [place] [address]   Respiratory (~20 pathogens) panel by PCR     Status: None   Collection Time: 11/28/22 11:24 AM   Specimen: Nasopharyngeal Swab; Respiratory  Result Value Ref Range Status   Adenovirus NOT DETECTED NOT DETECTED Final   Coronavirus 229E NOT DETECTED NOT DETECTED Final    Comment: (NOTE) The Coronavirus on the Respiratory Panel, DOES NOT test for the novel  Coronavirus (2019 nCoV)    Coronavirus HKU1 NOT DETECTED NOT DETECTED Final   Coronavirus NL63 NOT DETECTED NOT DETECTED Final   Coronavirus OC43 NOT DETECTED NOT DETECTED Final   Metapneumovirus NOT DETECTED NOT DETECTED Final   Rhinovirus / Enterovirus NOT DETECTED NOT DETECTED Final   Influenza A NOT DETECTED NOT DETECTED Final   Influenza B NOT DETECTED NOT DETECTED Final   Parainfluenza Virus 1 NOT DETECTED NOT DETECTED Final   Parainfluenza Virus 2 NOT DETECTED NOT DETECTED Final   Parainfluenza Virus 3 NOT DETECTED NOT DETECTED Final   Parainfluenza Virus 4 NOT DETECTED NOT DETECTED Final   Respiratory Syncytial Virus NOT DETECTED NOT DETECTED  Final   Bordetella pertussis NOT DETECTED NOT DETECTED Final   Bordetella Parapertussis NOT DETECTED NOT DETECTED Final   Chlamydophila pneumoniae NOT DETECTED NOT DETECTED Final   Mycoplasma pneumoniae NOT DETECTED NOT DETECTED Final    Comment: Performed at [facility], [place] [address], [place], [place] [address]  Culture, Respiratory w Gram Stain     Status: None   Collection Time: 11/28/22 11:27 AM   Specimen: Bronchoalveolar Lavage; Respiratory  Result Value Ref Range Status   Specimen Description BRONCHIAL ALVEOLAR LAVAGE  Final   Special Requests NONE  Final   Gram Stain   Final    NO WBC SEEN NO ORGANISMS SEEN Performed at [facility], [address] [place], [place] [address]    Culture FEW STAPHYLOCOCCUS AUREUS RARE CANDIDA ALBICANS   Final   Report Status 12/01/2022 FINAL  Final   Organism ID, Bacteria STAPHYLOCOCCUS AUREUS  Final  Susceptibility   Staphylococcus aureus - MIC*    CIPROFLOXACIN <=0.5 SENSITIVE Sensitive     ERYTHROMYCIN RESISTANT Resistant     GENTAMICIN <=0.5 SENSITIVE Sensitive     OXACILLIN <=0.25 SENSITIVE Sensitive     TETRACYCLINE <=1 SENSITIVE Sensitive     VANCOMYCIN 1 SENSITIVE Sensitive     TRIMETH/SULFA <=10 SENSITIVE Sensitive     CLINDAMYCIN RESISTANT Resistant     RIFAMPIN <=0.5 SENSITIVE Sensitive     Inducible Clindamycin POSITIVE Resistant     * FEW STAPHYLOCOCCUS AUREUS  Fungus Culture With Stain     Status: Abnormal   Collection Time: 11/28/22 11:27 AM   Specimen: Bronchial Alveolar Lavage; Respiratory  Result Value Ref Range Status   Fungus Stain Final report  Final   Fungus (Mycology) Culture Preliminary report (A)  Final    Comment: (NOTE) Performed At: [facility] [address] [place], [place] [id number] Rush Farmer MD [id number]    Fungal Source BRONCHIAL ALVEOLAR LAVAGE  Final    Comment: Performed at [facility], [place] [address], [place], [place] [address]  Aspergillus Ag,  BAL/Serum     Status: None   Collection Time: 11/28/22 11:27 AM   Specimen: Bronchial Alveolar Lavage; Respiratory  Result Value Ref Range Status   Aspergillus Ag, BAL/Serum 0.26 0.00 - 0.49 Index Final    Comment: (NOTE) Performed At: [facility] [place], [place] [id number] Rush Farmer MD [id number]   Fungus Culture Result     Status: None   Collection Time: 11/28/22 11:27 AM  Result Value Ref Range Status   Result 1 Comment  Final    Comment: (NOTE) KOH/Calcofluor preparation:  no fungus observed. Performed At: [facility] [place], [place] [id number] Rush Farmer MD [id number]   Fungal organism reflex     Status: Abnormal   Collection Time: 11/28/22 11:27 AM  Result Value Ref Range Status   Fungal result 1 Candida albicans (A)  Final    Comment: (NOTE) Light growth Performed At: [facility] [place], [place] [id number] Rush Farmer MD [id number]   Resp panel by RT-PCR (RSV, Flu A&B, Covid) Anterior Nasal Swab     Status: None   Collection Time: 11/28/22  4:31 PM   Specimen: Anterior Nasal Swab  Result Value Ref Range Status   SARS Coronavirus 2 by RT PCR NEGATIVE NEGATIVE Final   Influenza A by PCR NEGATIVE NEGATIVE Final   Influenza B by PCR NEGATIVE NEGATIVE Final    Comment: (NOTE) The Xpert Xpress SARS-CoV-2/FLU/RSV plus assay is intended as an aid in the diagnosis of influenza from Nasopharyngeal swab specimens and should not be used as a sole basis for treatment. Nasal washings and aspirates are unacceptable for Xpert Xpress SARS-CoV-2/FLU/RSV testing.  Fact Sheet for Patients: EntrepreneurPulse.com.au  Fact Sheet for Healthcare Providers: IncredibleEmployment.be  This test is not yet approved or cleared by the [place] FDA and has been authorized for detection and/or diagnosis of SARS-CoV-2 by FDA under an Emergency Use  Authorization (EUA). This EUA will remain in effect (meaning this test can be used) for the duration of the COVID-19 declaration under Section 564(b)(1) of the Act, 21 U.S.C. section 360bbb-3(b)(1), unless the authorization is terminated or revoked.     Resp Syncytial Virus by PCR NEGATIVE NEGATIVE Final    Comment: (NOTE) Fact Sheet for Patients: EntrepreneurPulse.com.au  Fact Sheet for Healthcare Providers: IncredibleEmployment.be  This test is not yet approved or cleared by the  [place] Architectural technologist and has been authorized for detection and/or diagnosis of SARS-CoV-2 by FDA under an Print production planner (EUA). This EUA will remain in effect (meaning this test can be used) for the duration of the COVID-19 declaration under Section 564(b)(1) of the Act, 21 U.S.C. section 360bbb-3(b)(1), unless the authorization is terminated or revoked.  Performed at [facility], [place] [address], [place], [place] [address]     RADIOLOGY STUDIES/RESULTS: DG Chest Port 1 View  Result Date: 12/07/2022 CLINICAL DATA:  [id number] SOB (shortness of breath) [id number] EXAM: PORTABLE CHEST 1 VIEW COMPARISON:  12/05/2022 chest radiograph. FINDINGS: Stable cardiomediastinal silhouette with normal heart size. No pneumothorax. Probable stable trace bilateral pleural effusions. Improved lung volumes. Mild diffuse prominence of the parahilar interstitial markings, improved. No consolidative airspace disease. IMPRESSION: 1. Probable stable trace bilateral pleural effusions. 2. Improved lung volumes. Mild diffuse prominence of the parahilar interstitial markings, improved, favor improving mild pulmonary edema. No consolidative airspace disease to suggest a pneumonia. Electronically Signed   By: Ilona Sorrel M.D.   On: 12/07/2022 08:01     LOS: 9 days   Oren Binet, MD  [facility]    To contact the attending provider between 7A-7P or the covering provider during  after hours 7P-7A, please log into the web site [email or web address] and access using universal [place] password for that web site. If you do not have the password, please call the hospital operator.  12/07/2022, 9:51 AM

## 2022-12-07 NOTE — Progress Notes (Addendum)
I saw patient family bring clothes on unit approximately 15 minutes ago.  I just went in patient room and there is no one in there.  I did not see patient or family leave the unit.  Patient ABSCONDED the unit without any unit personnel being notified.  I did notify Dr. Sloan Leiter of patient not being in room and did not notify anyone when leaving the unit.

## 2022-12-07 NOTE — Plan of Care (Signed)
Problem: Education: Goal: Ability to describe self-care measures that may prevent or decrease complications (Diabetes Survival Skills Education) will improve Outcome: Progressing Goal: Individualized Educational Video(s) Outcome: Progressing   Problem: Cardiac: Goal: Ability to maintain an adequate cardiac output will improve Outcome: Progressing   Problem: Health Behavior/Discharge Planning: Goal: Ability to identify and utilize available resources and services will improve Outcome: Progressing Goal: Ability to manage health-related needs will improve Outcome: Progressing   Problem: Fluid Volume: Goal: Ability to achieve a balanced intake and output will improve Outcome: Progressing   Problem: Metabolic: Goal: Ability to maintain appropriate glucose levels will improve Outcome: Progressing   Problem: Nutritional: Goal: Maintenance of adequate nutrition will improve Outcome: Progressing Goal: Maintenance of adequate weight for body size and type will improve Outcome: Progressing   Problem: Respiratory: Goal: Will regain and/or maintain adequate ventilation Outcome: Progressing   Problem: Urinary Elimination: Goal: Ability to achieve and maintain adequate renal perfusion and functioning will improve Outcome: Progressing   Problem: Education: Goal: Ability to describe self-care measures that may prevent or decrease complications (Diabetes Survival Skills Education) will improve Outcome: Progressing Goal: Individualized Educational Video(s) Outcome: Progressing   Problem: Coping: Goal: Ability to adjust to condition or change in health will improve Outcome: Progressing   Problem: Fluid Volume: Goal: Ability to maintain a balanced intake and output will improve Outcome: Progressing   Problem: Health Behavior/Discharge Planning: Goal: Ability to identify and utilize available resources and services will improve Outcome: Progressing Goal: Ability to manage  health-related needs will improve Outcome: Progressing   Problem: Metabolic: Goal: Ability to maintain appropriate glucose levels will improve Outcome: Progressing   Problem: Nutritional: Goal: Maintenance of adequate nutrition will improve Outcome: Progressing Goal: Progress toward achieving an optimal weight will improve Outcome: Progressing   Problem: Skin Integrity: Goal: Risk for impaired skin integrity will decrease Outcome: Progressing   Problem: Tissue Perfusion: Goal: Adequacy of tissue perfusion will improve Outcome: Progressing   Problem: Education: Goal: Knowledge of General Education information will improve Description: Including pain rating scale, medication(s)/side effects and non-pharmacologic comfort measures Outcome: Progressing   Problem: Health Behavior/Discharge Planning: Goal: Ability to manage health-related needs will improve Outcome: Progressing   Problem: Clinical Measurements: Goal: Ability to maintain clinical measurements within normal limits will improve Outcome: Progressing Goal: Will remain free from infection Outcome: Progressing Goal: Diagnostic test results will improve Outcome: Progressing Goal: Respiratory complications will improve Outcome: Progressing Goal: Cardiovascular complication will be avoided Outcome: Progressing   Problem: Activity: Goal: Risk for activity intolerance will decrease Outcome: Progressing   Problem: Nutrition: Goal: Adequate nutrition will be maintained Outcome: Progressing   Problem: Coping: Goal: Level of anxiety will decrease Outcome: Progressing   Problem: Elimination: Goal: Will not experience complications related to bowel motility Outcome: Progressing Goal: Will not experience complications related to urinary retention Outcome: Progressing   Problem: Pain Managment: Goal: General experience of comfort will improve Outcome: Progressing   Problem: Safety: Goal: Ability to remain free  from injury will improve Outcome: Progressing   Problem: Skin Integrity: Goal: Risk for impaired skin integrity will decrease Outcome: Progressing   Problem: Education: Goal: Knowledge of disease or condition will improve Outcome: Progressing Goal: Understanding of discharge needs will improve Outcome: Progressing   Problem: Health Behavior/Discharge Planning: Goal: Ability to identify changes in lifestyle to reduce recurrence of condition will improve Outcome: Progressing Goal: Identification of resources available to assist in meeting health care needs will improve Outcome: Progressing   Problem: Physical Regulation: Goal: Complications related to  the disease process, condition or treatment will be avoided or minimized Outcome: Progressing   Problem: Safety: Goal: Ability to remain free from injury will improve Outcome: Progressing

## 2022-12-07 NOTE — Progress Notes (Signed)
Date and time results received: 12/07/22 0955 (use smartphrase ".now" to insert current time)  Test: random glucose Critical Value: 703  Name of Provider Notified: Ghimire  Orders Received? Or Actions Taken?: Drawn right before insulin doses given.  Dr. Durward Fortes to wait and see what cbg are at next draw.

## 2022-12-07 NOTE — Discharge Summary (Addendum)
PATIENT DETAILS Name: [name] Age: 37 y.o. Sex: female Date of Birth: 12/18/1985 MRN: [id number]. Admitting Physician: Collier Bullock, MD OX:[id number], [facility]  Admit Date: 11/27/2022 Discharge date: 12/07/2022  NOTE-PATIENT ABSCONDED ON 2/21-WAS THREATENING TO LEAVE AMA  Recommendations for Outpatient Follow-up:  Follow up with PCP in 1-2 weeks Please obtain CMP/CBC in one week Repeat two-view chest x-ray to confirm resolution of infiltrate  Admitted From:  Home  Disposition: ABSCONDED AGAINST MEDICAL ADVICE   Discharge Condition: fair  CODE STATUS:   Code Status: Full Code   Diet recommendation:  Diet Order             Diet Carb Modified Fluid consistency: Thin; Room service appropriate? Yes  Diet effective now                    Brief Summary: Patient is a 37 y.o.  female with history of DM-1, HFrEF, HTN, mood disorder, cocaine use-presenting with septic shock, acute hypoxic respiratory failure due to MSSA PNA and DKA.   Significant events: 2/12>> admit to ICU-intubated/proning 2/16>> extubated 2/18>> transferred to [facility] 2/21>> contemplated leaving AMA-but in the end absconded/left without nursing staff knowing.  This MD spoke with the patient prior to her leaving, and advised her the life-threatening and life disabling risk of leaving AMA.  I implored her to remain hospitalized and that I could potentially discharge her on 2/22.   Significant studies: 2/11>> CXR: Left> right airspace disease 2/12>> RUQ ultrasound: Gallbladder sludge/pericholecystic fluid 2/12>> echo: EF> 75% 2/17>> CXR: Diffuse interstitial/airspace opacities bilaterally 2/19>> CXR: Persistent diffuse bilateral interstitial opacities. 2/21>> CXR: Improved interstitial airspace opacities   Significant microbiology data: 1/27>> blood culture: Negative 1/28>> blood culture: Negative 2/11>> COVID/influenza/RSV PCR: Negative 2/12>> respiratory virus panel:  Negative 2/12>> BAL: MSSA, rare Candida albicans   Procedures: 2/12-2/16>> ETT 2/12>> bronchoscopy 2/12>> central venous access by PCCM   Consults: [place] [facility] Course: Septic shock secondary to MSSA PNA Sepsis physiology improved BP was on the higher side-midodrine was being tapered down  Completed at least 8 days of Ancef  Unfortunately left the unit without nursing staff now.  If she follows with PCP-PCP to consider repeating two-view chest x-ray in 4 to 6 weeks to document resolution of infiltrates.    Acute hypoxic/hypercarbic respiratory failure Secondary to MSSA PNA with ARDS Extubated 2/16 Postextubation-required heated high flow-she gradually improved-by day of discharge she was only on minimal amount of oxygen.   She was given several doses of IV Lasix to ensure negative balance  Unfortunately left/absconded from the hospital on [id number]   Acute metabolic encephalopathy 2/2 sepsis/DKA/hypoxia/AKI Resolved   AKI Hemodynamically mediated Resolved   DKA Resolved   DM 1 (A1c> 15.5 on 1/30) with uncontrolled hyperglycemia CBGs were significantly elevated on 2/21-dosage of Semglee/NovoLog was adjusted This MD had a long discussion with patient-while she was contemplating leaving the hospital AMA-I had asked her to stay to see if we could optimize her glycemic regimen further. Unfortunately she left/absconded on 2/21.  She was made aware of life-threatening/life disabling risk of leaving the hospital [place].  Diabetic gastroparesis She was continued on Reglan.   Transaminitis 2/2 shock liver Improving   Thrombocytopenia Likely secondary to sepsis HIT Ab neg on 2/14 Resolved-platelet counts back to normal    Normocytic anemia 2/2 critical illness S/p 1 unit of PRBC transfused on 2/19-Hb stable since then PCP to repeat CBC at next follow-up.   GERD PPI   Hypophosphatemia/Hypomagnesemia Repleted  Oropharyngeal dysphagia Due to  severe debility in the setting of critical illness Significant improvement in oral intake over the past several days  Advanced diet per SLP Cortrak tube removed on 2/20  Debility/deconditioning Due to acute illness CIR was recommended-but patient was not keen on going there.  Before she left AGAINST MEDICAL ADVICE/absconded-this MD advised her to remain hospitalized, and that we potentially could just get her home with home health if that was what she wanted.   ?  Hearing loss Could be second due to impacted cerumen Significantly better with Debrox eardrops-she is back to her baseline.    Nutrition Status: Nutrition Problem: Severe Malnutrition Etiology: chronic illness (uncontrolled T1DM (hemoglobin A1C >15.5), HFrEF) Signs/Symptoms: severe fat depletion, severe muscle depletion, percent weight loss (18.9% weight loss in < 2 months) Percent weight loss: 18.9 % Interventions: Tube feeding, Refer to RD note for recommendations   Pressure Ulcer: Pressure Injury 11/30/22 Coccyx Medial Stage 2 -  Partial thickness loss of dermis presenting as a shallow open injury with a red, pink wound bed without slough. (Active)  11/30/22 0800  Location: Coccyx  Location Orientation: Medial  Staging: Stage 2 -  Partial thickness loss of dermis presenting as a shallow open injury with a red, pink wound bed without slough.  Wound Description (Comments):   Present on Admission:   Dressing Type None 12/07/22 0800     Pressure Injury 11/30/22 Buttocks Right Stage 2 -  Partial thickness loss of dermis presenting as a shallow open injury with a red, pink wound bed without slough. (Active)  11/30/22 0800  Location: Buttocks  Location Orientation: Right  Staging: Stage 2 -  Partial thickness loss of dermis presenting as a shallow open injury with a red, pink wound bed without slough.  Wound Description (Comments):   Present on Admission:   Dressing Type Foam - Lift dressing to assess site every shift  12/06/22 1930     Nutrition Status: Nutrition Problem: Severe Malnutrition Etiology: chronic illness (uncontrolled T1DM (hemoglobin A1C >15.5), HFrEF) Signs/Symptoms: severe fat depletion, severe muscle depletion, percent weight loss (18.9% weight loss in < 2 months) Percent weight loss: 18.9 % Interventions: Tube feeding, Refer to RD note for recommendations    Obesity: Estimated body mass index is 19.07 kg/m as calculated from the following:   Height as of this encounter: 5' (1.524 m).   Weight as of this encounter: 44.3 kg.   RN pressure injury documentation: Pressure Injury 11/30/22 Coccyx Medial Stage 2 -  Partial thickness loss of dermis presenting as a shallow open injury with a red, pink wound bed without slough. (Active)  11/30/22 0800  Location: Coccyx  Location Orientation: Medial  Staging: Stage 2 -  Partial thickness loss of dermis presenting as a shallow open injury with a red, pink wound bed without slough.  Wound Description (Comments):   Present on Admission:   Dressing Type None (patient removed) 12/07/22 0800     Pressure Injury 11/30/22 Buttocks Right Stage 2 -  Partial thickness loss of dermis presenting as a shallow open injury with a red, pink wound bed without slough. (Active)  11/30/22 0800  Location: Buttocks  Location Orientation: Right  Staging: Stage 2 -  Partial thickness loss of dermis presenting as a shallow open injury with a red, pink wound bed without slough.  Wound Description (Comments):   Present on Admission:   Dressing Type Foam - Lift dressing to assess site every shift 12/06/22 1930     Discharge Diagnoses:  Principal Problem:   DKA (diabetic ketoacidosis) ([place])   Discharge Instructions:  Activity:  As tolerated with Full fall precautions use walker/cane & assistance as needed    Note-no medications were provided as she absconded-left without the nursing staff knowing  Allergies  Allergen Reactions   Codeine Anaphylaxis  and Nausea And Vomiting   Penicillins Rash    As a teenager   Tramadol Itching     Other Procedures/Studies: DG Chest Port 1 View  Result Date: 12/07/2022 CLINICAL DATA:  [id number] SOB (shortness of breath) [id number] EXAM: PORTABLE CHEST 1 VIEW COMPARISON:  12/05/2022 chest radiograph. FINDINGS: Stable cardiomediastinal silhouette with normal heart size. No pneumothorax. Probable stable trace bilateral pleural effusions. Improved lung volumes. Mild diffuse prominence of the parahilar interstitial markings, improved. No consolidative airspace disease. IMPRESSION: 1. Probable stable trace bilateral pleural effusions. 2. Improved lung volumes. Mild diffuse prominence of the parahilar interstitial markings, improved, favor improving mild pulmonary edema. No consolidative airspace disease to suggest a pneumonia. Electronically Signed   By: Ilona Sorrel M.D.   On: 12/07/2022 08:01   DG Chest Port 1 View  Result Date: 12/05/2022 CLINICAL DATA:  Shortness of breath EXAM: PORTABLE CHEST 1 VIEW COMPARISON:  CXR 12/03/22 FINDINGS: Enteric tube courses below diaphragm with the tip out field-of-view. The persistent bilateral interstitial opacities, slightly improved from prior exam. No pleural effusion. No pneumothorax. Unchanged cardiac and mediastinal contours. No radiographically apparent displaced rib fractures. Visualized upper abdomen is unremarkable. IMPRESSION: Persistent bilateral interstitial opacities, stable to slightly improved from prior exam. Electronically Signed   By: Marin Roberts M.D.   On: 12/05/2022 07:49   DG CHEST PORT 1 VIEW  Result Date: 12/03/2022 CLINICAL DATA:  Hypoxia.  Shortness of breath, pneumothorax. EXAM: PORTABLE CHEST 1 VIEW COMPARISON:  11/29/2022. FINDINGS: The heart size and mediastinal contours are within normal limits. Interstitial and diffuse hazy airspace opacities are present in the lungs bilaterally. No effusion or pneumothorax. An enteric tube courses over the left upper  quadrant and out of the field of view. There is a left internal jugular central venous catheter which is stable in position. IMPRESSION: Diffuse interstitial and airspace opacities in the lungs bilaterally, increased from the prior exam. Electronically Signed   By: Brett Fairy M.D.   On: 12/03/2022 04:35   DG Abd Portable 1V  Result Date: 11/30/2022 CLINICAL DATA:  Encounter for feeding tube placement EXAM: PORTABLE ABDOMEN - 1 VIEW COMPARISON:  11/29/2022. FINDINGS: There is a feeding tube identified with tip in the left lower quadrant of the abdomen in the expected location of the proximal jejunum, beyond the level of the ligament of Treitz. A nasogastric tube is identified with tip in the body of stomach and side port approximately 3 cm below the hemidiaphragms. Bowel gas pattern appears nonobstructed. No dilated bowel loops identified. IMPRESSION: Feeding tube tip is in the left lower quadrant of the abdomen in the expected location of the proximal jejunum. Nasogastric tube tip is in the expected location of the body of stomach. Electronically Signed   By: Kerby Moors M.D.   On: 11/30/2022 10:26   DG Abd 1 View  Result Date: 11/29/2022 CLINICAL DATA:  Enteric tube placement EXAM: ABDOMEN - 1 VIEW COMPARISON:  11/28/2022 abdominal radiograph FINDINGS: Weighted enteric tube tip his in distal stomach in the right abdomen, probably in the region of the pylorus. Separate non weighted enteric tube tip is in the body of the stomach in the midline. No dilated small bowel loops. No  evidence of pneumatosis or pneumoperitoneum. Separate inferior approach midline pelvic catheter overlies the deep pelvis. IMPRESSION: Weighted enteric tube tip in the distal stomach in the right abdomen, probably in the region of the pylorus. Separate non weighted enteric tube tip in the body of the stomach. Electronically Signed   By: Ilona Sorrel M.D.   On: 11/29/2022 10:20   DG Chest Port 1 View  Result Date:  11/29/2022 CLINICAL DATA:  [id number] ARDS (adult respiratory distress syndrome) ([place]) [phone] EXAM: PORTABLE CHEST 1 VIEW COMPARISON:  Chest radiograph from one day prior. FINDINGS: Endotracheal tube tip is 2.6 cm above the carina. Two enteric tubes enter the stomach with the tips not seen on this image. Left internal jugular central venous catheter terminates over the right atrium, unchanged. Stable cardiomediastinal silhouette with normal heart size. No pneumothorax. Trace right pleural effusion, stable. No left pleural effusion. Patchy hazy opacities throughout both lungs, significantly improved. IMPRESSION: 1. Well-positioned support structures. No pneumothorax. 2. Patchy hazy opacities throughout both lungs, compatible with reported ARDS, significantly improved. 3. Stable trace right pleural effusion. Electronically Signed   By: Ilona Sorrel M.D.   On: 11/29/2022 09:52   DG Chest Port 1 View  Result Date: 11/28/2022 CLINICAL DATA:  ARDS EXAM: PORTABLE CHEST 1 VIEW COMPARISON:  Chest x-ray 11/28/2022 FINDINGS: Endotracheal tube tip is 3.7 cm above the carina. Enteric tube extends below the diaphragm. Central venous catheter tip projects over the right atrium. The cardiomediastinal silhouette is within normal limits. Diffuse interstitial and airspace opacities, central predominance, are again seen. This is not significantly changed. There is no pleural effusion or pneumothorax. No acute fractures are seen. IMPRESSION: 1. Unchanged diffuse interstitial and airspace opacities, central predominance, consistent with ARDS. 2. Lines and tubes as above. Electronically Signed   By: Ronney Asters M.D.   On: 11/28/2022 20:10   DG Abd Portable 1V  Result Date: 11/28/2022 CLINICAL DATA:  Feeding tube placement EXAM: PORTABLE ABDOMEN - 1 VIEW limited for tube placement COMPARISON:  Ultrasound 11/28/2022 earlier FINDINGS: Limited x-ray for tube placement of the upper abdomen demonstrates feeding tube with tip at the level of  the pylorus just to the right of the spine. There is air in the stomach. Otherwise gasless appearance of the upper abdomen. Interstitial changes of the lungs at the edge of the imaging field. Dedicated chest x-ray when clinically appropriate IMPRESSION: 1. Feeding tube tip at the level of the pylorus. 2. Relatively gasless appearance of the upper abdomen except for air in the stomach. Electronically Signed   By: Jill Side M.D.   On: 11/28/2022 16:20   ECHOCARDIOGRAM COMPLETE  Result Date: 11/28/2022    ECHOCARDIOGRAM REPORT   Patient Name:   [facility] Date of Exam: 11/28/2022 Medical Rec #:  [id number]         Height:       60.0 in Accession #:    [id number]        Weight:       86.0 lb Date of Birth:  October 14, 1986         BSA:          1.304 m Patient Age:    36 years          BP:           89/63 mmHg Patient Gender: F                 HR:           111  bpm. Exam Location:  Inpatient Procedure: 2D Echo, Cardiac Doppler and Color Doppler Indications:    Pulmonary Hypertension I27.2  History:        Patient has prior history of Echocardiogram examinations, most                 recent 10/31/2021. CHF; Risk Factors:Hypertension, Diabetes and                 Current Smoker.  Sonographer:    Ronny Flurry Referring Phys: [id number] RAVI AGARWALA IMPRESSIONS  1. Left ventricular ejection fraction, by estimation, is >75%. The left ventricle has hyperdynamic function. The left ventricle has no regional wall motion abnormalities. There is moderate left ventricular hypertrophy. Left ventricular diastolic parameters are indeterminate.  2. Right ventricular systolic function is normal. The right ventricular size is normal. There is normal pulmonary artery systolic pressure. The estimated right ventricular systolic pressure is 123XX123 mmHg.  3. The mitral valve is grossly normal. Trivial mitral valve regurgitation. No evidence of mitral stenosis.  4. The aortic valve is grossly normal. Aortic valve regurgitation is not  visualized. No aortic stenosis is present.  5. The inferior vena cava is normal in size with greater than 50% respiratory variability, suggesting right atrial pressure of 3 mmHg. Comparison(s): No significant change from prior study. FINDINGS  Left Ventricle: Left ventricular ejection fraction, by estimation, is >75%. The left ventricle has hyperdynamic function. The left ventricle has no regional wall motion abnormalities. The left ventricular internal cavity size was normal in size. There is moderate left ventricular hypertrophy. Left ventricular diastolic parameters are indeterminate. Right Ventricle: The right ventricular size is normal. Right vetricular wall thickness was not well visualized. Right ventricular systolic function is normal. There is normal pulmonary artery systolic pressure. The tricuspid regurgitant velocity is 2.61 m/s, and with an assumed right atrial pressure of 3 mmHg, the estimated right ventricular systolic pressure is 123XX123 mmHg. Left Atrium: Left atrial size was normal in size. Right Atrium: Right atrial size was normal in size. Pericardium: There is no evidence of pericardial effusion. Mitral Valve: The mitral valve is grossly normal. Trivial mitral valve regurgitation. No evidence of mitral valve stenosis. Tricuspid Valve: The tricuspid valve is grossly normal. Tricuspid valve regurgitation is trivial. No evidence of tricuspid stenosis. Aortic Valve: The aortic valve is grossly normal. Aortic valve regurgitation is not visualized. No aortic stenosis is present. Aortic valve mean gradient measures 4.0 mmHg. Aortic valve peak gradient measures 7.5 mmHg. Aortic valve area, by VTI measures 1.94 cm. Pulmonic Valve: The pulmonic valve was grossly normal. Pulmonic valve regurgitation is not visualized. No evidence of pulmonic stenosis. Aorta: The aortic root, ascending aorta, aortic arch and descending aorta are all structurally normal, with no evidence of dilitation or obstruction. Venous:  The inferior vena cava is normal in size with greater than 50% respiratory variability, suggesting right atrial pressure of 3 mmHg. IAS/Shunts: The interatrial septum was not well visualized.  LEFT VENTRICLE PLAX 2D LVIDd:         2.30 cm   Diastology LVIDs:         1.60 cm   LV e' medial:    5.84 cm/s LV PW:         0.80 cm   LV E/e' medial:  15.4 LV IVS:        1.40 cm   LV e' lateral:   7.62 cm/s LVOT diam:     1.70 cm   LV E/e' lateral: 11.8 LV SV:  32 LV SV Index:   25 LVOT Area:     2.27 cm  RIGHT VENTRICLE         IVC TAPSE (M-mode): 1.1 cm  IVC diam: 1.60 cm LEFT ATRIUM           Index       RIGHT ATRIUM          Index LA diam:      1.60 cm 1.23 cm/m  RA Area:     3.02 cm LA Vol (A4C): 4.9 ml  3.74 ml/m  RA Volume:   4.12 ml  3.16 ml/m  AORTIC VALVE AV Area (Vmax):    2.04 cm AV Area (Vmean):   1.97 cm AV Area (VTI):     1.94 cm AV Vmax:           137.00 cm/s AV Vmean:          90.800 cm/s AV VTI:            0.167 m AV Peak Grad:      7.5 mmHg AV Mean Grad:      4.0 mmHg LVOT Vmax:         123.00 cm/s LVOT Vmean:        78.733 cm/s LVOT VTI:          0.143 m LVOT/AV VTI ratio: 0.86  AORTA Ao Root diam: 2.60 cm MITRAL VALVE               TRICUSPID VALVE MV Area (PHT): 4.17 cm    TR Peak grad:   27.2 mmHg MV Decel Time: 182 msec    TR Vmax:        261.00 cm/s MV E velocity: 89.70 cm/s MV A velocity: 89.70 cm/s  SHUNTS MV E/A ratio:  1.00        Systemic VTI:  0.14 m                            Systemic Diam: 1.70 cm Buford Dresser MD Electronically signed by Buford Dresser MD Signature Date/Time: 11/28/2022/3:51:44 PM    Final    DG CHEST PORT 1 VIEW  Result Date: 11/28/2022 CLINICAL DATA:  Central line placement. EXAM: PORTABLE CHEST 1 VIEW COMPARISON:  November 27, 2022. FINDINGS: The heart size and mediastinal contours are within normal limits. Endotracheal tube is in grossly good position. Interval placement of left internal jugular catheter with distal tip in expected  position of right atrium. Significantly increased bilateral reticular opacities are noted throughout both lungs most consistent with worsening atypical pneumonia. The visualized skeletal structures are unremarkable. IMPRESSION: Endotracheal tube in grossly good position. Left internal jugular catheter is noted with distal tip in expected position of right atrium; withdrawal by 3-4 cm is recommended. Increased bilateral lung opacities as noted above. Electronically Signed   By: Marijo Conception M.D.   On: 11/28/2022 12:14   US Abdomen Complete  Result Date: 11/28/2022 CLINICAL DATA:  [id number].  Hepatitis. EXAM: ABDOMEN ULTRASOUND COMPLETE COMPARISON:  CT with IV contrast 05/27/2022 FINDINGS: Gallbladder: There is intraluminal sludge. There is pericholecystic fluid. No gallstones are seen. There is no positive sonographic Murphy's sign. The free wall is thickened up to 3.7 mm. Common bile duct: Diameter: 4.8 mm with no intrahepatic biliary prominence. Liver: No focal lesion identified. There is increased hepatic echogenicity consistent with steatosis. Portal vein is patent on color Doppler imaging with normal direction of blood flow towards  the liver. IVC: No abnormality visualized. Pancreas: Visualized portion unremarkable. Spleen: Size and appearance within normal limits.  8.8 cm in length. Right Kidney: Length: 10.7 cm. Echogenicity within normal limits. No mass or hydronephrosis visualized. Left Kidney: Length: 11.8 cm. Echogenicity within normal limits. No mass or hydronephrosis visualized. Abdominal aorta: No aneurysm visualized. Other findings: None. Technically difficult due to: Patient agitation. IMPRESSION: 1. Gallbladder sludge with pericholecystic fluid and gallbladder wall thickening. No gallstones are seen. There is no positive sonographic Murphy's sign. 2. The wall thickening could be reactive due to the patient's hepatitis, could be reactive due to the presence of ascites, could relate to hepatic  dysfunction, or could be due to acalculous cholecystitis. Clinical correlation advised. 3. Hepatic steatosis. Electronically Signed   By: Telford Nab M.D.   On: 11/28/2022 05:26   DG Chest Port 1 View  Result Date: 11/27/2022 CLINICAL DATA:  ams EXAM: PORTABLE CHEST 1 VIEW COMPARISON:  Chest x-ray 11/12/2022 FINDINGS: The heart and mediastinal contours are unchanged. Interval development of bilateral, left greater than right, airspace and interstitial opacities. No pleural effusion. No pneumothorax. No acute osseous abnormality. IMPRESSION: Interval development of bilateral, left greater than right, airspace and interstitial opacities. Findings consistent with infection/inflammation. Followup PA and lateral chest X-ray is recommended in 3-4 weeks following therapy to ensure resolution. Electronically Signed   By: Iven Finn M.D.   On: 11/27/2022 23:11   CT Head Wo Contrast  Result Date: 11/12/2022 CLINICAL DATA:  Altered mental status. EXAM: CT HEAD WITHOUT CONTRAST TECHNIQUE: Contiguous axial images were obtained from the base of the skull through the vertex without intravenous contrast. RADIATION DOSE REDUCTION: This exam was performed according to the departmental dose-optimization program which includes automated exposure control, adjustment of the mA and/or kV according to patient size and/or use of iterative reconstruction technique. COMPARISON:  None Available. FINDINGS: Brain: No intracranial hemorrhage, mass effect, or midline shift. No hydrocephalus. The basilar cisterns are patent. No evidence of territorial infarct or acute ischemia. No extra-axial or intracranial fluid collection. Vascular: No hyperdense vessel or unexpected calcification. Skull: No fracture or focal lesion. Sinuses/Orbits: Mucosal thickening of left ethmoid air cells. Mucosal thickening with fluid level in the left maxillary sinus. No mastoid effusion. Other: None. IMPRESSION: 1. No acute intracranial abnormality. 2.  Left maxillary and ethmoid sinus mucosal thickening with fluid level, possible acute sinusitis. Electronically Signed   By: Keith Rake M.D.   On: 11/12/2022 21:14   DG Chest Portable 1 View  Result Date: 11/12/2022 CLINICAL DATA:  Altered mental status. EXAM: PORTABLE CHEST 1 VIEW COMPARISON:  None Available. FINDINGS: The heart size and mediastinal contours are within normal limits. Both lungs are clear. The visualized skeletal structures are unremarkable. There are numerous overlying monitor wires. IMPRESSION: No active disease. Electronically Signed   By: Telford Nab M.D.   On: 11/12/2022 20:05     TODAY-DAY OF DISCHARGE:  Subjective:   [name] today has left AGAINST MEDICAL ADVICE without the nursing staff knowing she left the hospital premises.  Objective:   Blood pressure 117/74, pulse 89, temperature (!) 97.5 F (36.4 C), temperature source Oral, resp. rate 20, height 5' (1.524 m), weight 44.3 kg, SpO2 92 %. No intake or output data in the 24 hours ending 12/07/22 1707 Filed Weights   12/02/22 0500 12/03/22 0412 12/05/22 0500  Weight: 45.7 kg 44.9 kg 44.3 kg      PERTINENT RADIOLOGIC STUDIES: DG Chest Port 1 View  Result Date: 12/07/2022 CLINICAL DATA:  [id number] SOB (shortness of breath) [id number] EXAM: PORTABLE CHEST 1 VIEW COMPARISON:  12/05/2022 chest radiograph. FINDINGS: Stable cardiomediastinal silhouette with normal heart size. No pneumothorax. Probable stable trace bilateral pleural effusions. Improved lung volumes. Mild diffuse prominence of the parahilar interstitial markings, improved. No consolidative airspace disease. IMPRESSION: 1. Probable stable trace bilateral pleural effusions. 2. Improved lung volumes. Mild diffuse prominence of the parahilar interstitial markings, improved, favor improving mild pulmonary edema. No consolidative airspace disease to suggest a pneumonia. Electronically Signed   By: Ilona Sorrel M.D.   On: 12/07/2022 08:01      PERTINENT LAB RESULTS: CBC: Recent Labs    12/05/22 0435 12/06/22 0440  WBC 6.7 7.8  HGB 6.2* 8.5*  HCT 18.5* 24.1*  PLT 147* 206   CMET CMP     Component Value Date/Time   NA 133 (L) 12/07/2022 1436   K 3.7 12/07/2022 1436   CL 95 (L) 12/07/2022 1436   CO2 27 12/07/2022 1436   GLUCOSE 213 (H) 12/07/2022 1436   BUN 26 (H) 12/07/2022 1436   CREATININE 0.98 12/07/2022 1436   CALCIUM 8.2 (L) 12/07/2022 1436   PROT 4.8 (L) 12/03/2022 0347   ALBUMIN 1.7 (L) 12/03/2022 0347   AST 34 12/03/2022 0347   ALT 47 (H) 12/03/2022 0347   ALKPHOS 168 (H) 12/03/2022 0347   BILITOT 0.6 12/03/2022 0347   GFRNONAA >60 12/07/2022 1436   GFRAA >60 04/07/2020 0723    GFR Estimated Creatinine Clearance: 54.4 mL/min (by C-G formula based on SCr of 0.98 mg/dL). No results for input(s): "LIPASE", "AMYLASE" in the last 72 hours. No results for input(s): "CKTOTAL", "CKMB", "CKMBINDEX", "TROPONINI" in the last 72 hours. Invalid input(s): "POCBNP" No results for input(s): "DDIMER" in the last 72 hours. No results for input(s): "HGBA1C" in the last 72 hours. No results for input(s): "CHOL", "HDL", "LDLCALC", "TRIG", "CHOLHDL", "LDLDIRECT" in the last 72 hours. No results for input(s): "TSH", "T4TOTAL", "T3FREE", "THYROIDAB" in the last 72 hours.  Invalid input(s): "FREET3" No results for input(s): "VITAMINB12", "FOLATE", "FERRITIN", "TIBC", "IRON", "RETICCTPCT" in the last 72 hours. Coags: No results for input(s): "INR" in the last 72 hours.  Invalid input(s): "PT" Microbiology: Recent Results (from the past 240 hour(s))  Resp panel by RT-PCR (RSV, Flu A&B, Covid) Anterior Nasal Swab     Status: None   Collection Time: 11/27/22 11:16 PM   Specimen: Anterior Nasal Swab  Result Value Ref Range Status   SARS Coronavirus 2 by RT PCR NEGATIVE NEGATIVE Final   Influenza A by PCR NEGATIVE NEGATIVE Final   Influenza B by PCR NEGATIVE NEGATIVE Final    Comment: (NOTE) The Xpert Xpress  SARS-CoV-2/FLU/RSV plus assay is intended as an aid in the diagnosis of influenza from Nasopharyngeal swab specimens and should not be used as a sole basis for treatment. Nasal washings and aspirates are unacceptable for Xpert Xpress SARS-CoV-2/FLU/RSV testing.  Fact Sheet for Patients: EntrepreneurPulse.com.au  Fact Sheet for Healthcare Providers: IncredibleEmployment.be  This test is not yet approved or cleared by the [place] FDA and has been authorized for detection and/or diagnosis of SARS-CoV-2 by FDA under an Emergency Use Authorization (EUA). This EUA will remain in effect (meaning this test can be used) for the duration of the COVID-19 declaration under Section 564(b)(1) of the Act, 21 U.S.C. section 360bbb-3(b)(1), unless the authorization is terminated or revoked.     Resp Syncytial Virus by PCR NEGATIVE NEGATIVE Final    Comment: (NOTE) Fact Sheet for Patients: EntrepreneurPulse.com.au  Fact  Sheet for Healthcare Providers: IncredibleEmployment.be  This test is not yet approved or cleared by the [place] and has been authorized for detection and/or diagnosis of SARS-CoV-2 by FDA under an Emergency Use Authorization (EUA). This EUA will remain in effect (meaning this test can be used) for the duration of the COVID-19 declaration under Section 564(b)(1) of the Act, 21 U.S.C. section 360bbb-3(b)(1), unless the authorization is terminated or revoked.  Performed at [facility], [place] [address], [place], [place] [address]   Respiratory (~20 pathogens) panel by PCR     Status: None   Collection Time: 11/28/22 11:24 AM   Specimen: Nasopharyngeal Swab; Respiratory  Result Value Ref Range Status   Adenovirus NOT DETECTED NOT DETECTED Final   Coronavirus 229E NOT DETECTED NOT DETECTED Final    Comment: (NOTE) The Coronavirus on the Respiratory Panel, DOES NOT test for the novel   Coronavirus (2019 nCoV)    Coronavirus HKU1 NOT DETECTED NOT DETECTED Final   Coronavirus NL63 NOT DETECTED NOT DETECTED Final   Coronavirus OC43 NOT DETECTED NOT DETECTED Final   Metapneumovirus NOT DETECTED NOT DETECTED Final   Rhinovirus / Enterovirus NOT DETECTED NOT DETECTED Final   Influenza A NOT DETECTED NOT DETECTED Final   Influenza B NOT DETECTED NOT DETECTED Final   Parainfluenza Virus 1 NOT DETECTED NOT DETECTED Final   Parainfluenza Virus 2 NOT DETECTED NOT DETECTED Final   Parainfluenza Virus 3 NOT DETECTED NOT DETECTED Final   Parainfluenza Virus 4 NOT DETECTED NOT DETECTED Final   Respiratory Syncytial Virus NOT DETECTED NOT DETECTED Final   Bordetella pertussis NOT DETECTED NOT DETECTED Final   Bordetella Parapertussis NOT DETECTED NOT DETECTED Final   Chlamydophila pneumoniae NOT DETECTED NOT DETECTED Final   Mycoplasma pneumoniae NOT DETECTED NOT DETECTED Final    Comment: Performed at [facility], [place]. [address], [place], [place] [address]  Culture, Respiratory w Gram Stain     Status: None   Collection Time: 11/28/22 11:27 AM   Specimen: Bronchoalveolar Lavage; Respiratory  Result Value Ref Range Status   Specimen Description BRONCHIAL ALVEOLAR LAVAGE  Final   Special Requests NONE  Final   Gram Stain   Final    NO WBC SEEN NO ORGANISMS SEEN Performed at [facility], [address] [place], [place] [address]    Culture FEW STAPHYLOCOCCUS AUREUS RARE CANDIDA ALBICANS   Final   Report Status 12/01/2022 FINAL  Final   Organism ID, Bacteria STAPHYLOCOCCUS AUREUS  Final      Susceptibility   Staphylococcus aureus - MIC*    CIPROFLOXACIN <=0.5 SENSITIVE Sensitive     ERYTHROMYCIN RESISTANT Resistant     GENTAMICIN <=0.5 SENSITIVE Sensitive     OXACILLIN <=0.25 SENSITIVE Sensitive     TETRACYCLINE <=1 SENSITIVE Sensitive     VANCOMYCIN 1 SENSITIVE Sensitive     TRIMETH/SULFA <=10 SENSITIVE Sensitive     CLINDAMYCIN RESISTANT Resistant      RIFAMPIN <=0.5 SENSITIVE Sensitive     Inducible Clindamycin POSITIVE Resistant     * FEW STAPHYLOCOCCUS AUREUS  Fungus Culture With Stain     Status: Abnormal   Collection Time: 11/28/22 11:27 AM   Specimen: Bronchial Alveolar Lavage; Respiratory  Result Value Ref Range Status   Fungus Stain Final report  Final   Fungus (Mycology) Culture Preliminary report (A)  Final    Comment: (NOTE) Performed At: [facility] [place], [place] [id number] Rush Farmer MD [id number]  Fungal Source BRONCHIAL ALVEOLAR LAVAGE  Final    Comment: Performed at [facility], [place] [address], [place], [place] [address]  Aspergillus Ag, BAL/Serum     Status: None   Collection Time: 11/28/22 11:27 AM   Specimen: Bronchial Alveolar Lavage; Respiratory  Result Value Ref Range Status   Aspergillus Ag, BAL/Serum 0.26 0.00 - 0.49 Index Final    Comment: (NOTE) Performed At: [facility] [place], [place] [id number] Rush Farmer MD [id number]   Fungus Culture Result     Status: None   Collection Time: 11/28/22 11:27 AM  Result Value Ref Range Status   Result 1 Comment  Final    Comment: (NOTE) KOH/Calcofluor preparation:  no fungus observed. Performed At: [facility] [place], [place] [id number] Rush Farmer MD [id number]   Fungal organism reflex     Status: Abnormal   Collection Time: 11/28/22 11:27 AM  Result Value Ref Range Status   Fungal result 1 Candida albicans (A)  Final    Comment: (NOTE) Light growth Performed At: [facility] [place], [place] [id number] Rush Farmer MD [id number]   Resp panel by RT-PCR (RSV, Flu A&B, Covid) Anterior Nasal Swab     Status: None   Collection Time: 11/28/22  4:31 PM   Specimen: Anterior Nasal Swab  Result Value Ref Range Status   SARS Coronavirus 2 by RT PCR NEGATIVE NEGATIVE Final   Influenza A by PCR NEGATIVE NEGATIVE Final   Influenza  B by PCR NEGATIVE NEGATIVE Final    Comment: (NOTE) The Xpert Xpress SARS-CoV-2/FLU/RSV plus assay is intended as an aid in the diagnosis of influenza from Nasopharyngeal swab specimens and should not be used as a sole basis for treatment. Nasal washings and aspirates are unacceptable for Xpert Xpress SARS-CoV-2/FLU/RSV testing.  Fact Sheet for Patients: EntrepreneurPulse.com.au  Fact Sheet for Healthcare Providers: IncredibleEmployment.be  This test is not yet approved or cleared by the [place] FDA and has been authorized for detection and/or diagnosis of SARS-CoV-2 by FDA under an Emergency Use Authorization (EUA). This EUA will remain in effect (meaning this test can be used) for the duration of the COVID-19 declaration under Section 564(b)(1) of the Act, 21 U.S.C. section 360bbb-3(b)(1), unless the authorization is terminated or revoked.     Resp Syncytial Virus by PCR NEGATIVE NEGATIVE Final    Comment: (NOTE) Fact Sheet for Patients: EntrepreneurPulse.com.au  Fact Sheet for Healthcare Providers: IncredibleEmployment.be  This test is not yet approved or cleared by the [place] FDA and has been authorized for detection and/or diagnosis of SARS-CoV-2 by FDA under an Emergency Use Authorization (EUA). This EUA will remain in effect (meaning this test can be used) for the duration of the COVID-19 declaration under Section 564(b)(1) of the Act, 21 U.S.C. section 360bbb-3(b)(1), unless the authorization is terminated or revoked.  Performed at [facility], [place] [address], [place], [place] [address]     FURTHER DISCHARGE INSTRUCTIONS:  Get Medicines reviewed and adjusted: Please take all your medications with you for your next visit with your Primary MD  Laboratory/radiological data: Please request your Primary MD to go over all hospital tests and procedure/radiological results at  the follow up, please ask your Primary MD to get all Hospital records sent to his/her office.  In some cases, they will be blood work, cultures and biopsy results pending at the time of your discharge. Please request that your primary care M.D. goes through all  the records of your hospital data and follows up on these results.  Also Note the following: If you experience worsening of your admission symptoms, develop shortness of breath, life threatening emergency, suicidal or homicidal thoughts you must seek medical attention immediately by calling 911 or calling your MD immediately  if symptoms less severe.  You must read complete instructions/literature along with all the possible adverse reactions/side effects for all the Medicines you take and that have been prescribed to you. Take any new Medicines after you have completely understood and accpet all the possible adverse reactions/side effects.   Do not drive when taking Pain medications or sleeping medications (Benzodaizepines)  Do not take more than prescribed Pain, Sleep and Anxiety Medications. It is not advisable to combine anxiety,sleep and pain medications without talking with your primary care practitioner  Special Instructions: If you have smoked or chewed Tobacco  in the last 2 yrs please stop smoking, stop any regular Alcohol  and or any Recreational drug use.  Wear Seat belts while driving.  Please note: You were cared for by a hospitalist during your hospital stay. Once you are discharged, your primary care physician will handle any further medical issues. Please note that NO REFILLS for any discharge medications will be authorized once you are discharged, as it is imperative that you return to your primary care physician (or establish a relationship with a primary care physician if you do not have one) for your post hospital discharge needs so that they can reassess your need for medications and monitor your lab values.  Total  Time spent coordinating discharge including counseling, education and face to face time equals greater than 30 minutes.  SignedOren Binet 12/07/2022 5:07 PM

## 2022-12-07 NOTE — Progress Notes (Signed)
Date and time results received: 12/07/22 0745 (use smartphrase ".now" to insert current time)  Test: cbg Critical Value: 560  Name of Provider Notified: Ghimire  Orders Received? Or Actions Taken?: orders received.  One time dose 20 units insulin aspart, change diet to carb modified. Ordered Stat glucose random draw.

## 2022-12-07 NOTE — Progress Notes (Signed)
Occupational Therapy Treatment Patient Details Name: [name] MRN: [id number] DOB: [birth year] Today's Date: 12/07/2022   History of present illness 37 yo female admitted 2/11 with DKA with ARDS and intubation 2/12-2/16. PMHx: DM, HFrEF, HTN, anxiety, cocaine abuse   OT comments  Pt progressing well towards acute OT goals. Able to walk in the room, transfer to [facility], stood to wash hands at sink. No physical assist needed this session for in room, OOB activity. For now, d/c recommendation remains appropriate, noted she was min A to walk 200' with mobility specialist yesterday.    Recommendations for follow up therapy are one component of a multi-disciplinary discharge planning process, led by the attending physician.  Recommendations may be updated based on patient status, additional functional criteria and insurance authorization.    Follow Up Recommendations  Acute inpatient rehab (3hours/day)     Assistance Recommended at Discharge Frequent or constant Supervision/Assistance  Patient can return home with the following  Help with stairs or ramp for entrance;A little help with walking and/or transfers;A little help with bathing/dressing/bathroom   Equipment Recommendations  Other (comment) (TBD)    Recommendations for Other Services Rehab consult    Precautions / Restrictions Precautions Precautions: Fall;Other (comment) Precaution Comments: watch sats Restrictions Weight Bearing Restrictions: No       Mobility Bed Mobility               General bed mobility comments: not observed    Transfers Overall transfer level: Needs assistance Equipment used: None Transfers: Sit to/from Stand, Bed to chair/wheelchair/BSC Sit to Stand: Supervision     Step pivot transfers: Supervision     General transfer comment: supervision for walking to [facility] then stood at sink to wash hands and walked to recliner. Mild unsteadiness but no LOB.     Balance Overall balance  assessment: Needs assistance Sitting-balance support: Feet supported, No upper extremity supported Sitting balance-Leahy Scale: Good     Standing balance support: No upper extremity supported, Single extremity supported Standing balance-Leahy Scale: Fair Standing balance comment: stood at sink to wash hands, mild unsteadiness, no LOB                           ADL either performed or assessed with clinical judgement   ADL Overall ADL's : Needs assistance/impaired     Grooming: Supervision/safety;Wash/dry hands;Standing                   Armed forces technical officer: Supervision/safety;Ambulation;BSC/3in1 Armed forces technical officer Details (indicate cue type and reason): pivotal steps to access [facility] Toileting- Clothing Manipulation and Hygiene: Supervision/safety;Sitting/lateral lean       Functional mobility during ADLs: Supervision/safety General ADL Comments: Pt walking to [facility] upon OT arrival. No physical assist needed. Completed OOB ADLs as above at supervision level. Up in recliner at end of session    Extremity/Trunk Assessment Upper Extremity Assessment Upper Extremity Assessment: Defer to OT evaluation;Generalized weakness   Lower Extremity Assessment Lower Extremity Assessment: Defer to PT evaluation        Vision   Vision Assessment?: No apparent visual deficits   Perception     Praxis      Cognition Arousal/Alertness: Awake/alert Behavior During Therapy: WFL for tasks assessed/performed Overall Cognitive Status: Within Functional Limits for tasks assessed  Exercises      Shoulder Instructions       General Comments Pt on RA upon OT arrival and headint to [facility]. SpO2 assessed once in the chair and was 95. Left on RA.    Pertinent Vitals/ Pain       Pain Assessment Pain Assessment: No/denies pain  Home Living                                          Prior Functioning/Environment               Frequency  Min 2X/week        Progress Toward Goals  OT Goals(current goals can now be found in the care plan section)  Progress towards OT goals: Progressing toward goals  Acute Rehab OT Goals OT Goal Formulation: With patient/family Time For Goal Achievement: 12/17/22 Potential to Achieve Goals: Good ADL Goals Pt Will Perform Grooming: with modified independence;sitting Pt Will Perform Lower Body Bathing: with min guard assist;sitting/lateral leans;sit to/from stand Pt Will Perform Lower Body Dressing: with min guard assist;sitting/lateral leans;sit to/from stand Pt Will Transfer to Toilet: with min guard assist;ambulating Pt Will Perform Toileting - Clothing Manipulation and hygiene: with min guard assist;sitting/lateral leans;sit to/from stand  Plan Discharge plan remains appropriate    Co-evaluation                 AM-PAC OT "6 Clicks" Daily Activity     Outcome Measure   Help from another person eating meals?: None Help from another person taking care of personal grooming?: None Help from another person toileting, which includes using toliet, bedpan, or urinal?: A Little Help from another person bathing (including washing, rinsing, drying)?: A Little Help from another person to put on and taking off regular upper body clothing?: None Help from another person to put on and taking off regular lower body clothing?: A Little 6 Click Score: 21    End of Session    OT Visit Diagnosis: Unsteadiness on feet (R26.81);Other abnormalities of gait and mobility (R26.89);Muscle weakness (generalized) (M62.81)   Activity Tolerance Patient tolerated treatment well   Patient Left in chair;with call bell/phone within reach   Nurse Communication          Time: 1205-1213 OT Time Calculation (min): 8 min  Charges: OT General Charges $OT Visit: 1 Visit OT Treatments $Self Care/Home Management : 8-22 mins  Tyrone Schimke, OT Acute Rehabilitation  Services Office: [phone]   Hortencia Pilar 12/07/2022, 12:26 PM

## 2022-12-07 NOTE — Progress Notes (Signed)
Patient requested to see me, RN.  I went in room and patient advised she is going to leave AMA.  I discussed with patient that her blood sugars had been high today and the importance of her staying until they are under control.  She stated that she is fine and that she was able to work with OT today in the room and did not need oxygen.  I contacted Dr. Sloan Leiter, who had previously ordered a STAT BMET, but somehow it was never drawn and I wasn't aware.  I called lab and they came up and drew lab.  Dr. Sloan Leiter also called patient in room and was unsuccessful in getting patient to change her mind about staying.  I did get patient to agree to stay until after the BMET lab resulted because if she is currently in DKA (diabetic ketoacidosis) the importance of her not leaving AMA.  Patient agreed to stay until BMET lab results are back.

## 2022-12-07 NOTE — Progress Notes (Signed)
Patient contemplating leaving AMA She doesn't want to go to rehab Her CBG's were uncontrolled this morning, I have had a long conversation with her this afternoon. Explained life threatening and life disabling risks of leaving AMA, I have asked her to stay, we can re-assess tomorrow, and if her CBG's and other issues are stable, we can probably just discharge her home with home health. She is aware that she is scheduled for a BMET to r/o DKA. Patient is going over her options, and will the RN know.

## 2023-01-05 LAB — FUNGUS CULTURE WITH STAIN

## 2023-01-05 LAB — FUNGUS CULTURE RESULT

## 2023-01-05 LAB — FUNGAL ORGANISM REFLEX

## 2023-06-18 DEATH — deceased
